# Patient Record
Sex: Female | Born: 1970 | Race: White | Hispanic: No | Smoking: Former smoker
Health system: Southern US, Community
[De-identification: ages and names within clinical notes are randomized; demographics above are authoritative.]

## PROBLEM LIST (undated history)

## (undated) DIAGNOSIS — R5383 Other fatigue: Secondary | ICD-10-CM

## (undated) DIAGNOSIS — M25472 Effusion, left ankle: Secondary | ICD-10-CM

## (undated) DIAGNOSIS — Z87442 Personal history of urinary calculi: Secondary | ICD-10-CM

## (undated) DIAGNOSIS — Z0282 Encounter for adoption services: Secondary | ICD-10-CM

## (undated) DIAGNOSIS — F4310 Post-traumatic stress disorder, unspecified: Secondary | ICD-10-CM

## (undated) DIAGNOSIS — R7303 Prediabetes: Secondary | ICD-10-CM

## (undated) DIAGNOSIS — R7689 Other specified abnormal immunological findings in serum: Secondary | ICD-10-CM

## (undated) DIAGNOSIS — I10 Essential (primary) hypertension: Secondary | ICD-10-CM

## (undated) DIAGNOSIS — M25471 Effusion, right ankle: Secondary | ICD-10-CM

## (undated) DIAGNOSIS — G43109 Migraine with aura, not intractable, without status migrainosus: Secondary | ICD-10-CM

## (undated) DIAGNOSIS — E538 Deficiency of other specified B group vitamins: Secondary | ICD-10-CM

## (undated) DIAGNOSIS — K529 Noninfective gastroenteritis and colitis, unspecified: Secondary | ICD-10-CM

## (undated) DIAGNOSIS — E079 Disorder of thyroid, unspecified: Secondary | ICD-10-CM

## (undated) DIAGNOSIS — J302 Other seasonal allergic rhinitis: Secondary | ICD-10-CM

## (undated) DIAGNOSIS — R768 Other specified abnormal immunological findings in serum: Secondary | ICD-10-CM

## (undated) DIAGNOSIS — I872 Venous insufficiency (chronic) (peripheral): Secondary | ICD-10-CM

## (undated) DIAGNOSIS — M545 Low back pain, unspecified: Secondary | ICD-10-CM

## (undated) DIAGNOSIS — Z8614 Personal history of Methicillin resistant Staphylococcus aureus infection: Secondary | ICD-10-CM

## (undated) DIAGNOSIS — K5790 Diverticulosis of intestine, part unspecified, without perforation or abscess without bleeding: Secondary | ICD-10-CM

## (undated) DIAGNOSIS — R0602 Shortness of breath: Secondary | ICD-10-CM

## (undated) DIAGNOSIS — M1990 Unspecified osteoarthritis, unspecified site: Secondary | ICD-10-CM

## (undated) DIAGNOSIS — E042 Nontoxic multinodular goiter: Secondary | ICD-10-CM

## (undated) DIAGNOSIS — I5030 Unspecified diastolic (congestive) heart failure: Secondary | ICD-10-CM

## (undated) DIAGNOSIS — F32A Depression, unspecified: Secondary | ICD-10-CM

## (undated) DIAGNOSIS — D1720 Benign lipomatous neoplasm of skin and subcutaneous tissue of unspecified limb: Secondary | ICD-10-CM

## (undated) DIAGNOSIS — E559 Vitamin D deficiency, unspecified: Secondary | ICD-10-CM

## (undated) DIAGNOSIS — G4733 Obstructive sleep apnea (adult) (pediatric): Secondary | ICD-10-CM

## (undated) DIAGNOSIS — G8929 Other chronic pain: Secondary | ICD-10-CM

## (undated) DIAGNOSIS — F419 Anxiety disorder, unspecified: Secondary | ICD-10-CM

## (undated) DIAGNOSIS — E049 Nontoxic goiter, unspecified: Secondary | ICD-10-CM

## (undated) DIAGNOSIS — U071 COVID-19: Secondary | ICD-10-CM

## (undated) DIAGNOSIS — Z91018 Allergy to other foods: Secondary | ICD-10-CM

## (undated) DIAGNOSIS — K219 Gastro-esophageal reflux disease without esophagitis: Secondary | ICD-10-CM

## (undated) DIAGNOSIS — R600 Localized edema: Secondary | ICD-10-CM

## (undated) DIAGNOSIS — F319 Bipolar disorder, unspecified: Secondary | ICD-10-CM

## (undated) DIAGNOSIS — N189 Chronic kidney disease, unspecified: Secondary | ICD-10-CM

## (undated) DIAGNOSIS — Z96659 Presence of unspecified artificial knee joint: Secondary | ICD-10-CM

## (undated) DIAGNOSIS — M25469 Effusion, unspecified knee: Secondary | ICD-10-CM

## (undated) DIAGNOSIS — E785 Hyperlipidemia, unspecified: Secondary | ICD-10-CM

## (undated) DIAGNOSIS — R011 Cardiac murmur, unspecified: Secondary | ICD-10-CM

## (undated) DIAGNOSIS — N393 Stress incontinence (female) (male): Secondary | ICD-10-CM

## (undated) DIAGNOSIS — K449 Diaphragmatic hernia without obstruction or gangrene: Secondary | ICD-10-CM

## (undated) DIAGNOSIS — M25475 Effusion, left foot: Secondary | ICD-10-CM

## (undated) DIAGNOSIS — N3941 Urge incontinence: Secondary | ICD-10-CM

## (undated) DIAGNOSIS — F1120 Opioid dependence, uncomplicated: Secondary | ICD-10-CM

## (undated) DIAGNOSIS — R51 Headache: Secondary | ICD-10-CM

## (undated) DIAGNOSIS — I5031 Acute diastolic (congestive) heart failure: Secondary | ICD-10-CM

## (undated) DIAGNOSIS — M25474 Effusion, right foot: Secondary | ICD-10-CM

## (undated) DIAGNOSIS — I83009 Varicose veins of unspecified lower extremity with ulcer of unspecified site: Secondary | ICD-10-CM

## (undated) DIAGNOSIS — N200 Calculus of kidney: Secondary | ICD-10-CM

## (undated) DIAGNOSIS — G5602 Carpal tunnel syndrome, left upper limb: Secondary | ICD-10-CM

## (undated) DIAGNOSIS — I739 Peripheral vascular disease, unspecified: Secondary | ICD-10-CM

## (undated) DIAGNOSIS — D649 Anemia, unspecified: Secondary | ICD-10-CM

## (undated) DIAGNOSIS — Z8489 Family history of other specified conditions: Secondary | ICD-10-CM

## (undated) DIAGNOSIS — S83249A Other tear of medial meniscus, current injury, unspecified knee, initial encounter: Secondary | ICD-10-CM

## (undated) DIAGNOSIS — M25569 Pain in unspecified knee: Secondary | ICD-10-CM

## (undated) DIAGNOSIS — E039 Hypothyroidism, unspecified: Secondary | ICD-10-CM

## (undated) DIAGNOSIS — G4730 Sleep apnea, unspecified: Secondary | ICD-10-CM

## (undated) DIAGNOSIS — F909 Attention-deficit hyperactivity disorder, unspecified type: Secondary | ICD-10-CM

## (undated) DIAGNOSIS — F329 Major depressive disorder, single episode, unspecified: Secondary | ICD-10-CM

## (undated) DIAGNOSIS — M722 Plantar fascial fibromatosis: Secondary | ICD-10-CM

## (undated) DIAGNOSIS — I82409 Acute embolism and thrombosis of unspecified deep veins of unspecified lower extremity: Secondary | ICD-10-CM

## (undated) DIAGNOSIS — G5600 Carpal tunnel syndrome, unspecified upper limb: Secondary | ICD-10-CM

## (undated) DIAGNOSIS — I890 Lymphedema, not elsewhere classified: Secondary | ICD-10-CM

## (undated) DIAGNOSIS — I5032 Chronic diastolic (congestive) heart failure: Secondary | ICD-10-CM

## (undated) DIAGNOSIS — R079 Chest pain, unspecified: Secondary | ICD-10-CM

## (undated) DIAGNOSIS — Z86718 Personal history of other venous thrombosis and embolism: Secondary | ICD-10-CM

## (undated) DIAGNOSIS — J45909 Unspecified asthma, uncomplicated: Secondary | ICD-10-CM

## (undated) DIAGNOSIS — M79642 Pain in left hand: Secondary | ICD-10-CM

## (undated) DIAGNOSIS — Z789 Other specified health status: Secondary | ICD-10-CM

## (undated) DIAGNOSIS — L97909 Non-pressure chronic ulcer of unspecified part of unspecified lower leg with unspecified severity: Secondary | ICD-10-CM

## (undated) DIAGNOSIS — I509 Heart failure, unspecified: Secondary | ICD-10-CM

## (undated) DIAGNOSIS — R12 Heartburn: Secondary | ICD-10-CM

## (undated) DIAGNOSIS — M79641 Pain in right hand: Secondary | ICD-10-CM

## (undated) HISTORY — DX: Stress incontinence (female) (male): N39.3

## (undated) HISTORY — PX: REPLACEMENT TOTAL KNEE: SUR1224

## (undated) HISTORY — DX: Heartburn: R12

## (undated) HISTORY — DX: Hyperlipidemia, unspecified: E78.5

## (undated) HISTORY — DX: Venous insufficiency (chronic) (peripheral): I87.2

## (undated) HISTORY — DX: Acute diastolic (congestive) heart failure: I50.31

## (undated) HISTORY — DX: Other fatigue: R53.83

## (undated) HISTORY — DX: Other specified health status: Z78.9

## (undated) HISTORY — DX: Unspecified asthma, uncomplicated: J45.909

## (undated) HISTORY — DX: Deficiency of other specified B group vitamins: E53.8

## (undated) HISTORY — DX: Prediabetes: R73.03

## (undated) HISTORY — DX: Urge incontinence: N39.41

## (undated) HISTORY — DX: Diverticulosis of intestine, part unspecified, without perforation or abscess without bleeding: K57.90

## (undated) HISTORY — DX: Effusion, unspecified knee: M25.469

## (undated) HISTORY — DX: Diaphragmatic hernia without obstruction or gangrene: K44.9

## (undated) HISTORY — DX: Effusion, left ankle: M25.472

## (undated) HISTORY — DX: Allergy to other foods: Z91.018

## (undated) HISTORY — DX: Encounter for adoption services: Z02.82

## (undated) HISTORY — DX: Vitamin D deficiency, unspecified: E55.9

## (undated) HISTORY — PX: CHOLECYSTECTOMY: SHX55

## (undated) HISTORY — DX: Presence of unspecified artificial knee joint: Z96.659

## (undated) HISTORY — PX: JOINT REPLACEMENT: SHX530

## (undated) HISTORY — DX: Personal history of other venous thrombosis and embolism: Z86.718

## (undated) HISTORY — DX: Cardiac murmur, unspecified: R01.1

## (undated) HISTORY — PX: ABDOMINAL HYSTERECTOMY: SHX81

## (undated) HISTORY — DX: Lymphedema, not elsewhere classified: I89.0

## (undated) HISTORY — DX: Chest pain, unspecified: R07.9

## (undated) HISTORY — PX: ANKLE SURGERY: SHX546

## (undated) HISTORY — DX: Benign lipomatous neoplasm of skin and subcutaneous tissue of unspecified limb: D17.20

## (undated) HISTORY — DX: Carpal tunnel syndrome, unspecified upper limb: G56.00

## (undated) HISTORY — DX: Effusion, left foot: M25.475

## (undated) HISTORY — DX: Unspecified osteoarthritis, unspecified site: M19.90

## (undated) HISTORY — DX: Shortness of breath: R06.02

## (undated) HISTORY — PX: WRIST SURGERY: SHX841

## (undated) HISTORY — DX: Attention-deficit hyperactivity disorder, unspecified type: F90.9

## (undated) HISTORY — PX: KNEE ARTHROSCOPY: SUR90

## (undated) HISTORY — DX: Noninfective gastroenteritis and colitis, unspecified: K52.9

## (undated) HISTORY — PX: DILATION AND CURETTAGE OF UTERUS: SHX78

## (undated) HISTORY — DX: Effusion, right ankle: M25.471

## (undated) HISTORY — DX: Acute embolism and thrombosis of unspecified deep veins of unspecified lower extremity: I82.409

## (undated) HISTORY — DX: Personal history of urinary calculi: Z87.442

## (undated) HISTORY — PX: TUBAL LIGATION: SHX77

## (undated) HISTORY — DX: Effusion, right foot: M25.474

## (undated) SURGERY — REPAIR, HERNIA, INGUINAL, BILATERAL, LAPAROSCOPIC
Anesthesia: General | Laterality: Bilateral

---

## 1997-12-15 ENCOUNTER — Ambulatory Visit (HOSPITAL_COMMUNITY): Admission: RE | Admit: 1997-12-15 | Discharge: 1997-12-15 | Payer: Self-pay | Admitting: Family Medicine

## 1998-01-14 ENCOUNTER — Ambulatory Visit (HOSPITAL_COMMUNITY): Admission: RE | Admit: 1998-01-14 | Discharge: 1998-01-14 | Payer: Self-pay | Admitting: Obstetrics and Gynecology

## 1998-01-17 ENCOUNTER — Ambulatory Visit (HOSPITAL_COMMUNITY): Admission: AD | Admit: 1998-01-17 | Discharge: 1998-01-17 | Payer: Self-pay | Admitting: Obstetrics and Gynecology

## 1998-02-07 ENCOUNTER — Ambulatory Visit (HOSPITAL_COMMUNITY): Admission: RE | Admit: 1998-02-07 | Discharge: 1998-02-07 | Payer: Self-pay | Admitting: *Deleted

## 1998-05-22 ENCOUNTER — Emergency Department (HOSPITAL_COMMUNITY): Admission: EM | Admit: 1998-05-22 | Discharge: 1998-05-22 | Payer: Self-pay | Admitting: Internal Medicine

## 1998-06-13 ENCOUNTER — Emergency Department (HOSPITAL_COMMUNITY): Admission: EM | Admit: 1998-06-13 | Discharge: 1998-06-13 | Payer: Self-pay | Admitting: Emergency Medicine

## 1998-06-19 ENCOUNTER — Emergency Department (HOSPITAL_COMMUNITY): Admission: EM | Admit: 1998-06-19 | Discharge: 1998-06-19 | Payer: Self-pay | Admitting: Internal Medicine

## 1998-06-19 ENCOUNTER — Encounter: Payer: Self-pay | Admitting: Internal Medicine

## 1998-08-30 ENCOUNTER — Encounter: Payer: Self-pay | Admitting: Emergency Medicine

## 1998-08-30 ENCOUNTER — Emergency Department (HOSPITAL_COMMUNITY): Admission: EM | Admit: 1998-08-30 | Discharge: 1998-08-30 | Payer: Self-pay | Admitting: Emergency Medicine

## 1999-01-02 ENCOUNTER — Emergency Department (HOSPITAL_COMMUNITY): Admission: EM | Admit: 1999-01-02 | Discharge: 1999-01-03 | Payer: Self-pay | Admitting: Emergency Medicine

## 1999-03-06 ENCOUNTER — Emergency Department (HOSPITAL_COMMUNITY): Admission: EM | Admit: 1999-03-06 | Discharge: 1999-03-06 | Payer: Self-pay | Admitting: Emergency Medicine

## 1999-03-15 ENCOUNTER — Encounter: Payer: Self-pay | Admitting: Orthopedic Surgery

## 1999-03-15 ENCOUNTER — Ambulatory Visit (HOSPITAL_COMMUNITY): Admission: RE | Admit: 1999-03-15 | Discharge: 1999-03-15 | Payer: Self-pay | Admitting: Orthopedic Surgery

## 1999-04-13 ENCOUNTER — Emergency Department (HOSPITAL_COMMUNITY): Admission: EM | Admit: 1999-04-13 | Discharge: 1999-04-13 | Payer: Self-pay | Admitting: Emergency Medicine

## 1999-05-01 ENCOUNTER — Emergency Department (HOSPITAL_COMMUNITY): Admission: EM | Admit: 1999-05-01 | Discharge: 1999-05-01 | Payer: Self-pay | Admitting: Emergency Medicine

## 1999-05-29 ENCOUNTER — Ambulatory Visit (HOSPITAL_COMMUNITY): Admission: RE | Admit: 1999-05-29 | Discharge: 1999-05-29 | Payer: Self-pay | Admitting: Internal Medicine

## 1999-05-29 ENCOUNTER — Encounter: Payer: Self-pay | Admitting: Internal Medicine

## 1999-06-13 ENCOUNTER — Emergency Department (HOSPITAL_COMMUNITY): Admission: EM | Admit: 1999-06-13 | Discharge: 1999-06-13 | Payer: Self-pay | Admitting: Emergency Medicine

## 1999-06-14 ENCOUNTER — Encounter: Payer: Self-pay | Admitting: Emergency Medicine

## 1999-07-05 ENCOUNTER — Encounter: Payer: Self-pay | Admitting: Orthopedic Surgery

## 1999-07-05 ENCOUNTER — Ambulatory Visit (HOSPITAL_COMMUNITY): Admission: RE | Admit: 1999-07-05 | Discharge: 1999-07-05 | Payer: Self-pay | Admitting: Orthopedic Surgery

## 1999-07-19 ENCOUNTER — Encounter: Payer: Self-pay | Admitting: Orthopedic Surgery

## 1999-07-19 ENCOUNTER — Ambulatory Visit (HOSPITAL_COMMUNITY): Admission: RE | Admit: 1999-07-19 | Discharge: 1999-07-19 | Payer: Self-pay | Admitting: Orthopedic Surgery

## 1999-08-09 ENCOUNTER — Ambulatory Visit (HOSPITAL_BASED_OUTPATIENT_CLINIC_OR_DEPARTMENT_OTHER): Admission: RE | Admit: 1999-08-09 | Discharge: 1999-08-09 | Payer: Self-pay | Admitting: Orthopedic Surgery

## 1999-10-09 ENCOUNTER — Emergency Department (HOSPITAL_COMMUNITY): Admission: EM | Admit: 1999-10-09 | Discharge: 1999-10-09 | Payer: Self-pay

## 1999-10-22 ENCOUNTER — Emergency Department (HOSPITAL_COMMUNITY): Admission: EM | Admit: 1999-10-22 | Discharge: 1999-10-22 | Payer: Self-pay | Admitting: Emergency Medicine

## 1999-10-25 ENCOUNTER — Encounter: Payer: Self-pay | Admitting: Internal Medicine

## 1999-10-25 ENCOUNTER — Encounter: Admission: RE | Admit: 1999-10-25 | Discharge: 1999-10-25 | Payer: Self-pay | Admitting: Internal Medicine

## 1999-12-28 ENCOUNTER — Emergency Department (HOSPITAL_COMMUNITY): Admission: EM | Admit: 1999-12-28 | Discharge: 1999-12-29 | Payer: Self-pay | Admitting: Emergency Medicine

## 2000-01-03 ENCOUNTER — Ambulatory Visit (HOSPITAL_COMMUNITY): Admission: RE | Admit: 2000-01-03 | Discharge: 2000-01-03 | Payer: Self-pay | Admitting: Obstetrics & Gynecology

## 2000-04-17 ENCOUNTER — Other Ambulatory Visit: Admission: RE | Admit: 2000-04-17 | Discharge: 2000-04-17 | Payer: Self-pay | Admitting: *Deleted

## 2000-07-03 ENCOUNTER — Inpatient Hospital Stay (HOSPITAL_COMMUNITY): Admission: AD | Admit: 2000-07-03 | Discharge: 2000-07-05 | Payer: Self-pay | Admitting: Obstetrics and Gynecology

## 2000-07-05 ENCOUNTER — Encounter: Payer: Self-pay | Admitting: Obstetrics and Gynecology

## 2000-07-08 ENCOUNTER — Encounter: Payer: Self-pay | Admitting: Obstetrics & Gynecology

## 2000-07-08 ENCOUNTER — Ambulatory Visit (HOSPITAL_COMMUNITY): Admission: RE | Admit: 2000-07-08 | Discharge: 2000-07-08 | Payer: Self-pay | Admitting: Obstetrics & Gynecology

## 2000-09-04 ENCOUNTER — Inpatient Hospital Stay: Admission: AD | Admit: 2000-09-04 | Discharge: 2000-09-04 | Payer: Self-pay | Admitting: Obstetrics and Gynecology

## 2000-09-04 ENCOUNTER — Encounter: Payer: Self-pay | Admitting: Obstetrics and Gynecology

## 2000-09-05 ENCOUNTER — Inpatient Hospital Stay (HOSPITAL_COMMUNITY): Admission: AD | Admit: 2000-09-05 | Discharge: 2000-09-05 | Payer: Self-pay | Admitting: Obstetrics and Gynecology

## 2000-10-04 ENCOUNTER — Ambulatory Visit (HOSPITAL_COMMUNITY): Admission: RE | Admit: 2000-10-04 | Discharge: 2000-10-04 | Payer: Self-pay | Admitting: Obstetrics and Gynecology

## 2000-10-25 ENCOUNTER — Ambulatory Visit (HOSPITAL_COMMUNITY): Admission: RE | Admit: 2000-10-25 | Discharge: 2000-10-25 | Payer: Self-pay | Admitting: *Deleted

## 2000-10-31 ENCOUNTER — Inpatient Hospital Stay (HOSPITAL_COMMUNITY): Admission: AD | Admit: 2000-10-31 | Discharge: 2000-10-31 | Payer: Self-pay | Admitting: Obstetrics & Gynecology

## 2000-11-09 ENCOUNTER — Inpatient Hospital Stay (HOSPITAL_COMMUNITY): Admission: AD | Admit: 2000-11-09 | Discharge: 2000-11-12 | Payer: Self-pay | Admitting: Obstetrics & Gynecology

## 2000-11-09 ENCOUNTER — Encounter (INDEPENDENT_AMBULATORY_CARE_PROVIDER_SITE_OTHER): Payer: Self-pay | Admitting: Specialist

## 2000-11-14 ENCOUNTER — Encounter: Admission: RE | Admit: 2000-11-14 | Discharge: 2000-12-14 | Payer: Self-pay | Admitting: Obstetrics & Gynecology

## 2001-02-20 ENCOUNTER — Ambulatory Visit (HOSPITAL_BASED_OUTPATIENT_CLINIC_OR_DEPARTMENT_OTHER): Admission: RE | Admit: 2001-02-20 | Discharge: 2001-02-20 | Payer: Self-pay | Admitting: Orthopedic Surgery

## 2001-05-27 ENCOUNTER — Ambulatory Visit (HOSPITAL_BASED_OUTPATIENT_CLINIC_OR_DEPARTMENT_OTHER): Admission: RE | Admit: 2001-05-27 | Discharge: 2001-05-28 | Payer: Self-pay | Admitting: Orthopedic Surgery

## 2002-01-09 ENCOUNTER — Encounter: Payer: Self-pay | Admitting: Emergency Medicine

## 2002-01-09 ENCOUNTER — Ambulatory Visit (HOSPITAL_COMMUNITY): Admission: RE | Admit: 2002-01-09 | Discharge: 2002-01-09 | Payer: Self-pay | Admitting: Emergency Medicine

## 2002-02-19 ENCOUNTER — Emergency Department (HOSPITAL_COMMUNITY): Admission: EM | Admit: 2002-02-19 | Discharge: 2002-02-20 | Payer: Self-pay | Admitting: Emergency Medicine

## 2002-06-03 ENCOUNTER — Emergency Department (HOSPITAL_COMMUNITY): Admission: EM | Admit: 2002-06-03 | Discharge: 2002-06-03 | Payer: Self-pay | Admitting: Emergency Medicine

## 2002-06-09 ENCOUNTER — Other Ambulatory Visit: Admission: RE | Admit: 2002-06-09 | Discharge: 2002-06-09 | Payer: Self-pay | Admitting: Obstetrics and Gynecology

## 2002-07-02 ENCOUNTER — Other Ambulatory Visit: Admission: RE | Admit: 2002-07-02 | Discharge: 2002-07-02 | Payer: Self-pay | Admitting: *Deleted

## 2002-07-07 ENCOUNTER — Encounter (INDEPENDENT_AMBULATORY_CARE_PROVIDER_SITE_OTHER): Payer: Self-pay | Admitting: Specialist

## 2002-07-07 ENCOUNTER — Observation Stay (HOSPITAL_COMMUNITY): Admission: RE | Admit: 2002-07-07 | Discharge: 2002-07-08 | Payer: Self-pay | Admitting: Obstetrics and Gynecology

## 2003-02-25 ENCOUNTER — Inpatient Hospital Stay (HOSPITAL_COMMUNITY): Admission: EM | Admit: 2003-02-25 | Discharge: 2003-03-05 | Payer: Self-pay | Admitting: Psychiatry

## 2003-03-08 ENCOUNTER — Other Ambulatory Visit (HOSPITAL_COMMUNITY): Admission: RE | Admit: 2003-03-08 | Discharge: 2003-03-24 | Payer: Self-pay | Admitting: Psychiatry

## 2003-03-30 ENCOUNTER — Inpatient Hospital Stay (HOSPITAL_COMMUNITY): Admission: EM | Admit: 2003-03-30 | Discharge: 2003-04-15 | Payer: Self-pay | Admitting: Psychiatry

## 2003-04-12 ENCOUNTER — Encounter: Payer: Self-pay | Admitting: Emergency Medicine

## 2003-04-14 ENCOUNTER — Encounter: Payer: Self-pay | Admitting: Emergency Medicine

## 2003-04-15 ENCOUNTER — Encounter: Payer: Self-pay | Admitting: Emergency Medicine

## 2003-04-17 ENCOUNTER — Emergency Department (HOSPITAL_COMMUNITY): Admission: EM | Admit: 2003-04-17 | Discharge: 2003-04-18 | Payer: Self-pay | Admitting: Emergency Medicine

## 2003-04-20 ENCOUNTER — Encounter: Admission: RE | Admit: 2003-04-20 | Discharge: 2003-04-20 | Payer: Self-pay | Admitting: General Surgery

## 2003-04-20 ENCOUNTER — Encounter: Payer: Self-pay | Admitting: General Surgery

## 2003-04-27 ENCOUNTER — Encounter: Payer: Self-pay | Admitting: General Surgery

## 2003-04-27 ENCOUNTER — Encounter (INDEPENDENT_AMBULATORY_CARE_PROVIDER_SITE_OTHER): Payer: Self-pay

## 2003-04-27 ENCOUNTER — Observation Stay (HOSPITAL_COMMUNITY): Admission: RE | Admit: 2003-04-27 | Discharge: 2003-04-28 | Payer: Self-pay | Admitting: General Surgery

## 2003-08-13 ENCOUNTER — Other Ambulatory Visit (HOSPITAL_COMMUNITY): Admission: RE | Admit: 2003-08-13 | Discharge: 2003-08-20 | Payer: Self-pay | Admitting: Psychiatry

## 2003-08-19 ENCOUNTER — Inpatient Hospital Stay (HOSPITAL_COMMUNITY): Admission: EM | Admit: 2003-08-19 | Discharge: 2003-08-27 | Payer: Self-pay | Admitting: Psychiatry

## 2003-08-26 ENCOUNTER — Encounter: Payer: Self-pay | Admitting: Psychiatry

## 2003-08-30 ENCOUNTER — Other Ambulatory Visit (HOSPITAL_COMMUNITY): Admission: RE | Admit: 2003-08-30 | Discharge: 2003-09-16 | Payer: Self-pay | Admitting: Psychiatry

## 2003-09-21 ENCOUNTER — Encounter: Admission: RE | Admit: 2003-09-21 | Discharge: 2003-09-21 | Payer: Self-pay | Admitting: Psychiatry

## 2003-11-19 ENCOUNTER — Inpatient Hospital Stay (HOSPITAL_COMMUNITY): Admission: EM | Admit: 2003-11-19 | Discharge: 2003-11-22 | Payer: Self-pay | Admitting: Emergency Medicine

## 2003-12-22 ENCOUNTER — Emergency Department (HOSPITAL_COMMUNITY): Admission: EM | Admit: 2003-12-22 | Discharge: 2003-12-22 | Payer: Self-pay | Admitting: Family Medicine

## 2003-12-25 ENCOUNTER — Emergency Department (HOSPITAL_COMMUNITY): Admission: EM | Admit: 2003-12-25 | Discharge: 2003-12-25 | Payer: Self-pay | Admitting: *Deleted

## 2004-01-10 ENCOUNTER — Emergency Department (HOSPITAL_COMMUNITY): Admission: EM | Admit: 2004-01-10 | Discharge: 2004-01-10 | Payer: Self-pay | Admitting: Family Medicine

## 2004-04-26 ENCOUNTER — Emergency Department (HOSPITAL_COMMUNITY): Admission: EM | Admit: 2004-04-26 | Discharge: 2004-04-26 | Payer: Self-pay | Admitting: Emergency Medicine

## 2004-07-26 ENCOUNTER — Emergency Department (HOSPITAL_COMMUNITY): Admission: EM | Admit: 2004-07-26 | Discharge: 2004-07-26 | Payer: Self-pay | Admitting: Emergency Medicine

## 2004-10-07 ENCOUNTER — Emergency Department: Payer: Self-pay | Admitting: Unknown Physician Specialty

## 2004-10-08 ENCOUNTER — Emergency Department (HOSPITAL_COMMUNITY): Admission: EM | Admit: 2004-10-08 | Discharge: 2004-10-08 | Payer: Self-pay | Admitting: Emergency Medicine

## 2004-10-10 ENCOUNTER — Emergency Department (HOSPITAL_COMMUNITY): Admission: EM | Admit: 2004-10-10 | Discharge: 2004-10-10 | Payer: Self-pay | Admitting: Family Medicine

## 2004-11-08 ENCOUNTER — Emergency Department: Payer: Self-pay | Admitting: Emergency Medicine

## 2005-02-11 ENCOUNTER — Emergency Department: Payer: Self-pay | Admitting: Emergency Medicine

## 2005-03-16 ENCOUNTER — Emergency Department: Payer: Self-pay | Admitting: Emergency Medicine

## 2005-03-17 ENCOUNTER — Ambulatory Visit: Payer: Self-pay | Admitting: Psychiatry

## 2005-03-17 ENCOUNTER — Inpatient Hospital Stay (HOSPITAL_COMMUNITY): Admission: EM | Admit: 2005-03-17 | Discharge: 2005-03-26 | Payer: Self-pay | Admitting: Psychiatry

## 2005-03-31 ENCOUNTER — Emergency Department: Payer: Self-pay | Admitting: Emergency Medicine

## 2005-04-26 ENCOUNTER — Emergency Department: Payer: Self-pay | Admitting: Emergency Medicine

## 2005-07-10 ENCOUNTER — Emergency Department: Payer: Self-pay | Admitting: General Practice

## 2005-08-23 ENCOUNTER — Emergency Department: Payer: Self-pay | Admitting: Emergency Medicine

## 2005-09-22 ENCOUNTER — Emergency Department: Payer: Self-pay | Admitting: Internal Medicine

## 2005-10-15 ENCOUNTER — Emergency Department: Payer: Self-pay | Admitting: General Practice

## 2005-12-19 ENCOUNTER — Emergency Department: Payer: Self-pay | Admitting: Emergency Medicine

## 2005-12-24 ENCOUNTER — Emergency Department: Payer: Self-pay | Admitting: Emergency Medicine

## 2005-12-30 ENCOUNTER — Emergency Department: Payer: Self-pay | Admitting: Emergency Medicine

## 2006-01-20 ENCOUNTER — Emergency Department: Payer: Self-pay | Admitting: Emergency Medicine

## 2006-03-20 ENCOUNTER — Emergency Department: Payer: Self-pay | Admitting: Emergency Medicine

## 2006-03-22 ENCOUNTER — Emergency Department: Payer: Self-pay

## 2006-03-23 ENCOUNTER — Emergency Department: Payer: Self-pay | Admitting: Emergency Medicine

## 2006-03-24 ENCOUNTER — Emergency Department (HOSPITAL_COMMUNITY): Admission: EM | Admit: 2006-03-24 | Discharge: 2006-03-24 | Payer: Self-pay | Admitting: Emergency Medicine

## 2006-05-26 ENCOUNTER — Emergency Department (HOSPITAL_COMMUNITY): Admission: EM | Admit: 2006-05-26 | Discharge: 2006-05-27 | Payer: Self-pay | Admitting: Emergency Medicine

## 2006-06-02 ENCOUNTER — Emergency Department: Payer: Self-pay | Admitting: Emergency Medicine

## 2006-09-10 ENCOUNTER — Other Ambulatory Visit: Payer: Self-pay

## 2006-09-10 ENCOUNTER — Emergency Department: Payer: Self-pay | Admitting: Emergency Medicine

## 2006-09-18 ENCOUNTER — Ambulatory Visit: Payer: Self-pay | Admitting: Family Medicine

## 2006-09-29 ENCOUNTER — Emergency Department: Payer: Self-pay | Admitting: General Practice

## 2007-02-03 ENCOUNTER — Emergency Department: Payer: Self-pay

## 2007-02-16 ENCOUNTER — Emergency Department: Payer: Self-pay | Admitting: Emergency Medicine

## 2007-02-28 ENCOUNTER — Emergency Department: Payer: Self-pay | Admitting: Emergency Medicine

## 2007-03-15 ENCOUNTER — Emergency Department: Payer: Self-pay | Admitting: Emergency Medicine

## 2007-03-18 ENCOUNTER — Emergency Department: Payer: Self-pay | Admitting: Emergency Medicine

## 2007-04-03 ENCOUNTER — Other Ambulatory Visit: Payer: Self-pay

## 2007-04-03 ENCOUNTER — Emergency Department: Payer: Self-pay | Admitting: Emergency Medicine

## 2007-04-04 ENCOUNTER — Other Ambulatory Visit: Payer: Self-pay

## 2007-04-04 ENCOUNTER — Emergency Department: Payer: Self-pay | Admitting: Unknown Physician Specialty

## 2007-04-14 ENCOUNTER — Emergency Department: Payer: Self-pay | Admitting: Emergency Medicine

## 2007-05-08 ENCOUNTER — Emergency Department: Payer: Self-pay | Admitting: Emergency Medicine

## 2007-05-09 ENCOUNTER — Emergency Department: Payer: Self-pay | Admitting: Emergency Medicine

## 2007-07-11 ENCOUNTER — Emergency Department: Payer: Self-pay | Admitting: Emergency Medicine

## 2007-08-26 ENCOUNTER — Emergency Department: Payer: Self-pay | Admitting: Emergency Medicine

## 2007-10-03 ENCOUNTER — Other Ambulatory Visit: Payer: Self-pay

## 2007-10-03 ENCOUNTER — Emergency Department: Payer: Self-pay | Admitting: Unknown Physician Specialty

## 2007-10-23 ENCOUNTER — Emergency Department: Payer: Self-pay | Admitting: Emergency Medicine

## 2007-11-01 ENCOUNTER — Emergency Department: Payer: Self-pay | Admitting: Internal Medicine

## 2007-11-01 ENCOUNTER — Emergency Department: Payer: Self-pay | Admitting: Emergency Medicine

## 2007-11-06 ENCOUNTER — Emergency Department: Payer: Self-pay | Admitting: Emergency Medicine

## 2007-12-01 ENCOUNTER — Emergency Department: Payer: Self-pay | Admitting: Emergency Medicine

## 2007-12-05 ENCOUNTER — Emergency Department (HOSPITAL_COMMUNITY): Admission: EM | Admit: 2007-12-05 | Discharge: 2007-12-05 | Payer: Self-pay | Admitting: Emergency Medicine

## 2007-12-30 ENCOUNTER — Other Ambulatory Visit: Payer: Self-pay

## 2007-12-30 ENCOUNTER — Emergency Department: Payer: Self-pay | Admitting: Emergency Medicine

## 2008-01-01 ENCOUNTER — Other Ambulatory Visit: Payer: Self-pay

## 2008-01-01 ENCOUNTER — Emergency Department: Payer: Self-pay | Admitting: Emergency Medicine

## 2008-01-10 ENCOUNTER — Emergency Department: Payer: Self-pay | Admitting: Emergency Medicine

## 2008-02-04 ENCOUNTER — Emergency Department: Payer: Self-pay | Admitting: Emergency Medicine

## 2008-02-13 ENCOUNTER — Emergency Department: Payer: Self-pay | Admitting: Internal Medicine

## 2008-02-18 ENCOUNTER — Ambulatory Visit: Payer: Self-pay | Admitting: Endocrinology

## 2008-03-09 ENCOUNTER — Emergency Department: Payer: Self-pay | Admitting: Emergency Medicine

## 2008-03-11 ENCOUNTER — Emergency Department: Payer: Self-pay | Admitting: Emergency Medicine

## 2008-04-02 ENCOUNTER — Emergency Department: Payer: Self-pay | Admitting: Emergency Medicine

## 2008-05-29 ENCOUNTER — Ambulatory Visit: Payer: Self-pay

## 2008-06-17 ENCOUNTER — Emergency Department: Payer: Self-pay | Admitting: Emergency Medicine

## 2008-07-02 ENCOUNTER — Ambulatory Visit: Payer: Self-pay | Admitting: Orthopedic Surgery

## 2008-07-06 ENCOUNTER — Ambulatory Visit: Payer: Self-pay | Admitting: Orthopedic Surgery

## 2008-09-19 ENCOUNTER — Emergency Department: Payer: Self-pay | Admitting: Emergency Medicine

## 2008-10-05 ENCOUNTER — Emergency Department: Payer: Self-pay | Admitting: Emergency Medicine

## 2008-10-06 ENCOUNTER — Ambulatory Visit: Payer: Self-pay | Admitting: Internal Medicine

## 2008-10-30 ENCOUNTER — Emergency Department: Payer: Self-pay | Admitting: Emergency Medicine

## 2008-11-04 ENCOUNTER — Emergency Department: Payer: Self-pay | Admitting: Emergency Medicine

## 2008-12-03 ENCOUNTER — Emergency Department (HOSPITAL_COMMUNITY): Admission: EM | Admit: 2008-12-03 | Discharge: 2008-12-03 | Payer: Self-pay | Admitting: Emergency Medicine

## 2008-12-09 ENCOUNTER — Emergency Department: Payer: Self-pay | Admitting: Emergency Medicine

## 2009-01-09 ENCOUNTER — Emergency Department: Payer: Self-pay | Admitting: Internal Medicine

## 2009-02-20 ENCOUNTER — Emergency Department: Payer: Self-pay | Admitting: Emergency Medicine

## 2009-03-06 ENCOUNTER — Emergency Department: Payer: Self-pay | Admitting: Emergency Medicine

## 2009-03-30 ENCOUNTER — Emergency Department: Payer: Self-pay | Admitting: Emergency Medicine

## 2009-04-03 ENCOUNTER — Emergency Department: Payer: Self-pay | Admitting: Emergency Medicine

## 2009-04-17 ENCOUNTER — Emergency Department: Payer: Self-pay | Admitting: Emergency Medicine

## 2009-04-25 ENCOUNTER — Other Ambulatory Visit: Payer: Self-pay

## 2009-04-25 ENCOUNTER — Other Ambulatory Visit: Payer: Self-pay | Admitting: Emergency Medicine

## 2009-04-25 ENCOUNTER — Ambulatory Visit: Payer: Self-pay | Admitting: Psychiatry

## 2009-04-25 ENCOUNTER — Inpatient Hospital Stay (HOSPITAL_COMMUNITY): Admission: RE | Admit: 2009-04-25 | Discharge: 2009-05-02 | Payer: Self-pay | Admitting: Psychiatry

## 2009-05-24 ENCOUNTER — Ambulatory Visit: Payer: Self-pay | Admitting: Orthopedic Surgery

## 2009-06-03 ENCOUNTER — Ambulatory Visit: Payer: Self-pay | Admitting: Orthopedic Surgery

## 2009-06-07 ENCOUNTER — Ambulatory Visit: Payer: Self-pay | Admitting: Orthopedic Surgery

## 2009-06-30 ENCOUNTER — Emergency Department: Payer: Self-pay | Admitting: Internal Medicine

## 2009-10-08 ENCOUNTER — Emergency Department: Payer: Self-pay | Admitting: Internal Medicine

## 2009-10-29 ENCOUNTER — Emergency Department: Payer: Self-pay | Admitting: Emergency Medicine

## 2009-11-09 ENCOUNTER — Emergency Department: Payer: Self-pay | Admitting: Emergency Medicine

## 2009-12-28 ENCOUNTER — Emergency Department: Payer: Self-pay | Admitting: Emergency Medicine

## 2010-01-12 ENCOUNTER — Emergency Department: Payer: Self-pay | Admitting: Emergency Medicine

## 2010-03-26 ENCOUNTER — Emergency Department: Payer: Self-pay | Admitting: Emergency Medicine

## 2010-04-28 ENCOUNTER — Emergency Department: Payer: Self-pay | Admitting: Emergency Medicine

## 2010-04-30 ENCOUNTER — Emergency Department: Payer: Self-pay | Admitting: Emergency Medicine

## 2010-05-08 ENCOUNTER — Emergency Department: Payer: Self-pay | Admitting: Unknown Physician Specialty

## 2010-05-13 ENCOUNTER — Emergency Department: Payer: Self-pay | Admitting: Emergency Medicine

## 2010-06-28 ENCOUNTER — Emergency Department: Payer: Self-pay | Admitting: Emergency Medicine

## 2010-09-01 ENCOUNTER — Emergency Department: Payer: Self-pay | Admitting: Emergency Medicine

## 2010-10-09 ENCOUNTER — Emergency Department: Payer: Self-pay | Admitting: Emergency Medicine

## 2010-10-19 LAB — RAPID URINE DRUG SCREEN, HOSP PERFORMED
Cocaine: NOT DETECTED
Opiates: NOT DETECTED
Tetrahydrocannabinol: NOT DETECTED

## 2010-10-19 LAB — URINALYSIS, ROUTINE W REFLEX MICROSCOPIC
Bilirubin Urine: NEGATIVE
Glucose, UA: NEGATIVE mg/dL
Glucose, UA: NEGATIVE mg/dL
Hgb urine dipstick: NEGATIVE
Ketones, ur: NEGATIVE mg/dL
Leukocytes, UA: NEGATIVE
Nitrite: NEGATIVE
Protein, ur: NEGATIVE mg/dL
Protein, ur: NEGATIVE mg/dL
Urobilinogen, UA: 0.2 mg/dL (ref 0.0–1.0)

## 2010-10-19 LAB — CBC
HCT: 34.5 % — ABNORMAL LOW (ref 36.0–46.0)
MCHC: 33.1 g/dL (ref 30.0–36.0)
MCV: 86 fL (ref 78.0–100.0)
Platelets: 361 10*3/uL (ref 150–400)
RDW: 14.6 % (ref 11.5–15.5)

## 2010-10-19 LAB — DIFFERENTIAL
Basophils Absolute: 0.2 10*3/uL — ABNORMAL HIGH (ref 0.0–0.1)
Basophils Relative: 2 % — ABNORMAL HIGH (ref 0–1)
Eosinophils Absolute: 0.3 10*3/uL (ref 0.0–0.7)
Eosinophils Relative: 3 % (ref 0–5)

## 2010-10-19 LAB — BASIC METABOLIC PANEL
BUN: 7 mg/dL (ref 6–23)
CO2: 30 mEq/L (ref 19–32)
GFR calc non Af Amer: >60 mL/min (ref >60)
Glucose, Bld: 98 mg/dL (ref 70–99)
Potassium: 3.6 mEq/L (ref 3.5–5.1)

## 2010-10-19 LAB — ACETAMINOPHEN LEVEL: Acetaminophen (Tylenol), Serum: <10 ug/mL — ABNORMAL LOW (ref 10–30)

## 2010-10-19 LAB — URINE MICROSCOPIC-ADD ON

## 2010-10-24 LAB — URINALYSIS, ROUTINE W REFLEX MICROSCOPIC
Bilirubin Urine: NEGATIVE
Glucose, UA: NEGATIVE mg/dL
Ketones, ur: NEGATIVE mg/dL
Specific Gravity, Urine: 1.009 (ref 1.005–1.030)
pH: 7 (ref 5.0–8.0)

## 2010-11-07 ENCOUNTER — Ambulatory Visit: Payer: Self-pay | Admitting: Internal Medicine

## 2010-11-09 ENCOUNTER — Ambulatory Visit: Payer: Self-pay | Admitting: Internal Medicine

## 2010-11-21 ENCOUNTER — Other Ambulatory Visit: Payer: Self-pay

## 2010-12-01 NOTE — Consult Note (Signed)
[facility]  Patient:    Alexis Christensen, Alexis Christensen                        MRN: [id number] Adm. Date:  [id number] Attending:  Esmeralda Arthur Dictator:   Silverio Lay, M.D.                          Consultation Report  REASON FOR CONSULTATION:      Severe migraine.  HISTORY OF PRESENT ILLNESS:   This is a 40 year old married white female gravida 3, para 1, abortus 1 at 19+ weeks pregnancy complaining of a severe headache for the last four days which has substantially increased today.  She reports a left-sided retro-orbital migraine with sinus pressure radiating to the back of her head and neck for which she has found minor relief with the use of Tylenol with Codeine or even Motrin 800 mg tonight after speaking with me on the telephone.  She also reports great decrease in sleep and increased stress at the work environment.  She denies any other neurological-associated symptoms besides photophobia and mild nausea.  Otherwise her pregnancy has been uneventful.  She reports good fetal activity, denies any contraction or abdominal pain.  PAST MEDICAL HISTORY:         Significant for allergy to PENICILLIN.  PHYSICAL EXAMINATION:  VITAL SIGNS:                  Reveals the vital signs normal with the blood pressure of 118/66, pulse at 68, respiration at 20, temperature at 97.4.  GENERAL:                      The patient in no acute distress.  HEAD, EYES, EARS, NOSE, AND THROAT:                       Negative.  Positive paranasal sinus pressure on her left side.  NECK:                         Lymph nodes and neck area negative.  Neck is flexible.  HEART:                        Normal.  LUNGS:                        Clear.  ABDOMEN:                      Gravid, nontender.  ASSESSMENT:                   Intrauterine pregnancy at 19+ weeks with a nonaccompanied migraine probably secondary to sinus congestion and allergies.  PLAN:                         The  patient is given one dose of Nubain 10 mg subcu and put on oxygen 5 liter a minute.  CBC and urinalysis are within normal limits.  When some relief achieved, the patient will be discharged home with Ambien and will follow in the office within one week. DD:  07/03/00 TD:  07/03/00 Job: [id number]

## 2010-12-01 NOTE — Op Note (Signed)
[facility]  Patient:    Alexis Christensen, Alexis Christensen                        MRN: [id number] Proc. Date: 11/10/00 Adm. Date:  [id number] Attending:  Genia Del                           Operative Report  DATE OF BIRTH:                June 19, 1971  PREOPERATIVE DIAGNOSIS:       Postpartum bilateral tubal ligation desired.  POSTOPERATIVE DIAGNOSIS:      Postpartum bilateral tubal ligation desired.  OPERATION:                    Bilateral tubal ligation by modified Pomeroy                               procedure through minilaparotomy.  SURGEON:                      Genia Del, M.D.  ANESTHESIOLOGIST:             Ellison Hughs., M.D.  ANESTHESIA:                   Epidural.  ESTIMATED BLOOD LOSS:         Minimal.  DESCRIPTION OF PROCEDURE:     Under epidural anesthesia, the patient is in a dorsal decubitus position. She is prepped with Betadine on the abdominal area. The drapes are placed as usual. Uterine height is at the umbilicus. A minilaparotomy approach is used. An incision is made infraumbilically measuring about 2.5 cm with a scalpel. We then open the aponeurosis and the peritoneum with the Mayo scissors while raising the aponeurosis with Allis clamps. Retractors are used. We enter the abdominal cavity, visualize the uterus, use the retractors to reach for the right tube. It is grasped with a Babcock and then followed to the fimbria for good identification. Once achieved, a window is created at the level of the mesosalpinx in an avascular area with the Metzenbaum scissors. We then pass a plain 0 suture and ligate the proximal part of the tube at about 2-3 cm from the cornua. We use another plain 0 to suture the distal part of the tube and then we cut the section of tube in between and send it to pathology. Electrocautery is used to complete hemostasis at the two cut ends of the tube on the right side. We proceed exactly the same  way on the left side. The tube is well identified down to the fimbria on that side as well. Hemostasis is adequate. We remove the retractors. We then close the aponeurosis with Vicryl 0 using 2x stitches that closes the aponeurosis completely. We then verify hemostasis at the adipose tissue. It is adequate. We had injected Marcaine 0.25% plain, 10 cc, at the beginning of the surgery. We then reapproximate the skin with a subcuticular suture with Vicryl 4-0. Hemostasis is adequate at the skin. Count of instruments and compresses was adequate x 2. Estimated blood loss was minimal. No complication occurred, and the patient was transferred to recovery room in good status. DD:  11/10/00 TD:  11/10/00 Job: [id number]
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
## 2010-12-01 NOTE — Op Note (Signed)
[place]  Patient:    Alexis Christensen                         MRN: [id number] Proc. Date: 08/09/99 Adm. Date:  [id number] Attending:  Ronne Binning CC:         Nicki Reaper, M.D. (2)                           Operative Report  PREOPERATIVE DIAGNOSIS:  ___________ deformity, right wrist.  POSTOPERATIVE DIAGNOSIS:  ___________ deformity, right wrist.  OPERATION:  Arthroscopy with capsullary shrinking, right wrist.  SURGEON:  Nicki Reaper, M.D.  ASSISTANT:  Joaquin Courts, R.N.  ANESTHESIA:  General.  ANESTHESIOLOGIST:  Edwin Cap. Zoila Shutter, M.D.  HISTORY:  The patient is a 40 year old female with a history of wrist problems.  She has undergone partial fusion of her wrist.  She has a ___________ deformity. She is brought in for capsullary shrinking arthroscopically.  DESCRIPTION OF PROCEDURE:  The patient was brought to the operating room where  general anesthesia was carried out without difficulty.  She was prepped and draped using Betadine scrub and solution, with the right arm free.  The limb was placed in the arthroscopy tower, ten pounds of traction applied, the joint inflated through the 3-4 portal.  The joint inspected after making a small transverse incision through the skin, deepening this with a Hemostat.  One trocar was used to enter the joint.  The joint was inspected.  A moderate synovitis was present.  The triangular fibrocartilage showed a degenerative-type tear.  The ulnar head was visible but  showed no chondromalacia.  There was no defects on the lunate triquetrum.  A 6-U portal was used for irrigation.  Also a 4-5 portal was then made after localization with a 22 gauge needle.  An ArthroWand was inserted.  The TFCC tear was further  debrided.  A capsular shrinking was then performed on the volar ulnar wrist ligaments and triangular fibrocartilage complex.  The scope was introduced in the 4-5 portal.  Further  shrinking was performed with the ArthroWand in the 3-4 portal. The midcarpal joint was then inspected through a distal 3-4 portal.  A significant laxity of the ulnar ligaments were present.  The ulnar portals were set for both irrigation and for the ArthroWand which were inserted through, again, small transverse incision, deepened with Hemostat.  Blunt trocar used to enter the joint. The capsular shrinking was then performed onto the ulnar wrist ligaments from the midcarpal joint.  This showed significantly greater stability once this was done. The dorsal attachment of the dorsal intercarpal ligament and radial carpal ligament was then shrunk.  Its attachment to the lunate provided further dorsal stability. The instruments were removed.  The portals closed with interrupted 5-0 nylon sutures.  Sterile compressive dressing and dorsal palmar splint applied.  The patient tolerated the procedure well and was taken to the recovery room for  observation in satisfactory condition.  She is discharged home to return to [facility] in one week, n Vicodin and Septra DS. DD:  08/09/99 TD:  08/09/99 Job: [id number]
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
## 2010-12-01 NOTE — Op Note (Signed)
[facility]  Patient:    Alexis Christensen, Alexis Christensen                        MRN: [id number] Proc. Date: 01/03/00 Adm. Date:  [id number] Disc. Date: [id number] Attending:  Genia Del                           Operative Report  PREOPERATIVE DIAGNOSIS:       Stable left adnexal cyst, severe pelvic pain refractory to medical treatment, history of endometriosis.  POSTOPERATIVE DIAGNOSIS:      Stable left adnexal cyst, severe pelvic pain refractory to medical treatment, history of endometriosis.  No left ovarian cyst and complexes adhesions between the left ovary and the sigmoid.  Mild pelvic endometriosis.  OPERATION:                    Open laparoscopy with lysis of adhesions.  SURGEON:                      Genia Del, M.D.  ASSISTANT:                    Cordelia Pen A. Rosalio Macadamia, M.D.  ANESTHESIA:                   Belva Agee, M.D.  ESTIMATED BLOOD LOSS:  DESCRIPTION OF PROCEDURE:     Under general anesthesia with endotracheal intubation, the patient is in lithotomy position.  She is prepped with Betadine on the abdominal, suprapubic, vulvar, and vaginal areas.  A bladder catheterization is done and the patient is draped as usual.  Vaginally, a speculum is introduced and the uterus is cannulated.  The speculum is removed.  Abdominally, we proceeded ith an open technique.  An infraumbilical incision over 10 mm is done with a scalpel. The aponeurosis is opened with the Mayo scissors and the abdominal cavity is entered.  The trocar and then the laparoscope were introduced.  We secured the trocar with Vicryl 0 that was placed at the level of the aponeurosis on each side. Brief exploration of the pelvic cavity reveals no adhesions anteriorly.  A second entry site is therefore done with a 5 mm trocar and a probe is inserted at the suprapubic area.  We inspected the pelvic cavity.  The uterus is normal in volume and appearance.  The two tubes are  normal in appearance with good fimbria.  The  right ovary is completely normal and mobile.  The left ovary presents fine and moderately dense adhesions with the sigmoid colon and with the ovarian fossa. o active lesion of endometriosis is seen, but evidence of retraction is present in the posterior cul-de-sac.  A third entry is made at the level of the left iliac  fossa.  We made a 5 mm incision with the scalpel and introduced the 5 mm trocar.  A scissors with unipolar cauterization and an atraumatic clamp are used to release the adhesions between the ovary and the bowel and at the level of the ovarian fossa.  The ovary at the end of the procedure is pretty mobile.  No evidence of  cyst is present.  We verified the hemostasis which is adequate.  We irrigated and aspirated the pelvic cavity.  We removed the instruments.  The CO2 was evacuated. The sponge, needle, and instrument counts were correct.  The estimated blood loss  was minimal.  The umbilical incision was closed at the level of the aponeurosis  with Vicryl 0 and then the skin was reapproximated at all incisions with Monocryl 4-0.  Infiltration with Marcaine 0.25% was done at the subcutaneous tissue. Dressings were applied.  The vaginal instruments were removed.  The patient had no complications and was transferred to the recovery room in good status. DD:  01/11/00 TD:  01/12/00 Job: [id number]

## 2010-12-01 NOTE — Op Note (Signed)
NAME:  Alexis Christensen, Alexis Christensen                           ACCOUNT NO.:  [id number]   MEDICAL RECORD NO.:  [id number]                   PATIENT TYPE:  AMB   LOCATION:  DAY                                  FACILITY:  [facility]   PHYSICIAN:  Sharlet Salina T. Hoxworth, M.D.          DATE OF BIRTH:  08/21/1970   DATE OF PROCEDURE:  04/27/2003  DATE OF DISCHARGE:                                 OPERATIVE REPORT   PREOPERATIVE DIAGNOSIS:  Biliary dyskinesia/chronic cholecystitis.   POSTOPERATIVE DIAGNOSIS:  Biliary dyskinesia/chronic cholecystitis.   OPERATION/PROCEDURE:  Laparoscopic cholecystectomy with intraoperative  cholangiogram.   SURGEON:  Sharlet Salina T. Hoxworth, M.D.   ASSISTANT:  Anselm Pancoast. Zachery Dakins, M.D.   ANESTHESIA:  General.   BRIEF HISTORY:  Alexis Christensen is a 40 year old female who presents with two to  three weeks of persistent right upper quadrant abdominal pain radiating  toward her chest and mid abdomen.  This has been associated with nausea.  She has presented to the [facility] Emergency Room on two different  occasions.  She has had a gallbladder ultrasound that was negative and has  had a HIDA scan showing significantly decreased ejection fraction.  I  followed her in my office and her pain has not improved with followup and  upper GI series has been negative.  With these findings we elected to  proceed with laparoscopic cholecystectomy in an effort to relieve her  symptoms.  The nature, procedure, indications, risks of bleeding, infection,  bile leak, bile duct injury, and failure to relieve her pain were discussed  and understood preoperatively.  She is now brought to the operating room for  this procedure.   DESCRIPTION OF PROCEDURE:  The patient is brought to the operating room and  placed in the supine position on the operating table and general  endotracheal anesthesia was induced.  PS were in place.  She was given  preoperative antibiotics.  The abdomen was sterilely  prepped and draped.  The abdomen was accessed when opened with the Hasson technique at the  umbilicus and the standard port placement used.  At the gallbladder a few  omental adhesions were taken down.  It appeared slightly tense but not  distinctly abnormal.  The fundus was grasped and elevated up off the liver.  Both the liver and infundibulum were retracted inferolaterally.  Fibrofatty  tissue was stripped of the neck of the gallbladder toward the porta hepatis  and peritoneum anterior and posterior and Calot's triangle was incised and  Calot's triangle thoroughly dissected.  The cystic artery and the cystic  duct were identified.  The cystic duct was clipped at the gallbladder  junction and operative cholangiogram was obtained through the cystic duct  which showed good filling of normal  common bile duct and intrahepatic ducts  with free flow into the duodenum and no filling defects.  Following this,  the cystic duct was doubly clipped proximally and divided  as was the cystic  artery.  Gallbladder was dissected free from its bed with hook cautery and  moved intact from the umbilicus.  The right upper quadrant was irrigated and  complete hemostasis assured.  Trocars were removed under direct vision.  All  CO2 evacuated from the peritoneal cavity.  The mattress suture was secured  to the umbilicus.  Skin incisions were closed with interrupted subcuticular 4-0 Monocryl and  Steri-Strips.  Sponge, needle and instrument counts were correct.  Dry  dressings were applied.  The patient was taken to the recovery room in good  condition.                                                 Lorne Skeens. Hoxworth, M.D.    Tory Emerald  D:  04/27/2003  T:  04/27/2003  Job:  [id number]

## 2010-12-01 NOTE — H&P (Signed)
NAME:  Alexis Christensen, Alexis Christensen                           ACCOUNT NO.:  [id number]   MEDICAL RECORD NO.:  [id number]                   PATIENT TYPE:  INP   LOCATION:  0102                                 FACILITY:  [facility]   PHYSICIAN:  Sherin Quarry, MD                   DATE OF BIRTH:  12-20-1970   DATE OF ADMISSION:  11/19/2003  DATE OF DISCHARGE:                                HISTORY & PHYSICAL   Alexis Christensen is a 40 year old lady with a longstanding history of  psychiatric problems and previous overdose attempts who apparently today  took eleven Ativan, some Geodon tablets, Skelaxin 800 mg two or three, and  Tylenol with codeine. After doing this, she called a friend about 4:30 this  afternoon and eventually was transported to the [facility] emergency room.  There, she was lethargic, but alert, and was able to take Charcoal by mouth.  At the time that I came to see her, no one was with her. I tried to contact  family members at the number I was supplied and no one answered the phone.  Therefore, I could not obtain any additional information. In light of this  history of apparent suicide attempt, the patient will be admitted for  observation and subsequent psychiatric evaluation.   PAST MEDICAL HISTORY:  This information is obtained solely from medical  records.   ALLERGIES:  The patient is said to be allergic to PENICILLIN and  STRAWBERRIES.   MEDICATIONS:  These are unknown. On the discharge summary from a  hospitalization earlier this year her medications were listed as Vistaril 50  mg q.6h. p.r.n., lithium carbonate 300 mg q.a.m. and 600 mg at bedtime,  Seroquel 50 mg in the morning and 50 mg in the evening and 450 mg at  bedtime, Paxil 40 mg every morning, Motrin 400 mg t.i.d. p.r.n.   OPERATION:  The patient is listed as having had a wrist surgery in January  2001, ankle reconstruction in 1993 and again in 1997, and a laparoscopy for  endometriosis which apparently was done in  2001.  She has also had two  children and one spontaneous abortion.   MEDICAL ILLNESSES:  None are mentioned in the records.   FAMILY HISTORY:  Cannot be obtained.   SOCIAL HISTORY:  The records indicate that she is married. She smokes about  a half pack of cigarettes per day. She is said to occasionally drink  alcohol, but not to excess. As mentioned previously she has two children who  are girls.   REVIEW OF SYSTEMS:  Cannot obtained. All I could learn from talking to the  patient was that she denied experiencing any pain.   PHYSICAL EXAMINATION:  HEENT:  Within normal limits.  CHEST: Clear to auscultation and percussion.  CARDIOVASCULAR: Normal S1 and S2 without murmurs, rubs, or gallops.  ABDOMEN: Benign with normal bowel sounds without masses  or tenderness.  NEUROLOGIC: The patient would follow commands, although she was quite  lethargic. She was able to move all extremities to commands. She was able to  squeeze my fingers. She had normal diffuse reflexes. Babinski's downgoing.  Station and gait could not be tested.   Relevant laboratories obtained included a urine drug screen which was  nondetected. BMET showed a sodium of 138, potassium 3.7, chloride 108, CO2  25, glucose 118, creatinine 0.7. Lithium level was less than 0.25 possibly  consistent with noncompliance.   IMPRESSION:  1. Overdose of multiple drugs as outlined above.  2. History of major depression.  3. History of benzodiazepine abuse.  4. History of personality disorder.  5. History of migraine headaches.  6. Possible past history of hypothyroidism.  7. History of wrist and ankle reconstruction.  8. History of laparoscopic endometriosis surgery and allergy to PENICILLIN.   The patient will be admitted at this time for evaluation and observation.  Once she is medically stable a psychiatric evaluation will be requested. In  the meantime will make sure a sitter is present. Will give her intravenous  fluids.  Will initiate diet when she is more alert.                                               Sherin Quarry, MD    SY/MEDQ  D:  11/19/2003  T:  11/20/2003  Job:  [id number]   cc:   Jeanice Lim, M.D.  Fax: [phone]

## 2010-12-01 NOTE — Op Note (Signed)
[place]  Patient:    Alexis Christensen, Alexis Christensen Visit Number: [id number] MRN: [id number]          Service Type: DSU Location: [facility] Attending Physician:  Ronne Binning Dictated by:   Nicki Reaper, M.D. Admit Date:  05/27/2001                             Operative Report  PRINCIPLE DIAGNOSIS:  ______ deformity right wrist.  POSTOPERATIVE DIAGNOSIS:  ______ deformity right wrist.  OPERATION:  Fusion with iliac crest bone graft right wrist.  SURGEON:  Nicki Reaper, M.D.  ASSISTANT:  Joaquin Courts, R.N.  ANESTHESIA:  General.  ANESTHESIOLOGIST:  Janetta Hora. Gelene Mink, M.D.  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  PROCEDURE:  The patient was brought to the operating room where general endotracheal intubation anesthesia was carried out without difficulty.  She was prepped and draped using Betadine scrub and solution.  In the supine position with the right arm free, the limb was exsanguinated with an Esmarch bandage.  The tourniquet was placed high on the arm and was inflated to 250 mmHg.  A straight incision was made over Lister tubercle and carried up to the third metacarpal proximally to the distal forearm, carried down through the subcutaneous tissues.  Bleeders were electrocauterized and neurovascular structures were protected.  The dissection was carried down to the radius and to the carpus through the 3/4 interval.  The fourth dorsal compartment was elevated.  The second compartment was elevated.  The Lister tubercle was removed.  The joint was opened.  Significant degeneration of the midcarpal joint was identified.  Rongeurs, osteotomes and burs were then used to remove the cartilage from the distal radius, proximal scaphoid and lunate, distal scaphoid  and lunate triquetrum the hamate and capitate distally, the scaphoid lunate triquetrum distally and the interval in the carpal metacarpal joints in the index and middle fingers.  A separate incision was then made over the prepped iliac crest on the right side.  The incision was made and carried down through the subcutaneous tissues and the bleeders were then electrocauterized and dissection was carried down to the iliac crest.  The interval between the abductor and hip flexors was opened.  An osteotome was used to remove the dorsal cortex.  Curet was then used to remove cancellous bone.  It was noted that the iliac crest was quite narrow, however, adequate bone was removed along with the window which was morcellized.  This was packed.  The wound on the wrist was then copiously irrigated with Bacitracin containing solution.  The bone was then packed into each of the denuded areas of the proximal mid carpal joint and CMC joints.  A short bend Synthes wrist fusion plate was then selected.  A rongeur was then used to groove the distal radius to allow the plate to set in position.  This was then set over the third metacarpal and onto the distal radius.  A 14 mm screw was then inserted into the metacarpal distally after drilling with a 2 mm drill. Measuring this, the screw was placed fixing the plate.  The plate was then arranged using the OEC onto the radius and third metacarpal and the remaining screws were placed placing compression  on the proximal aspect. The screws measured between 14 and 22 mm.  Each was placed.  Position was confirmed with AP and lateral x-rays.  The wounds were copiously irrigated with Bacitracin containing saline solution.  The capsule and retinaculum and compartments were closed with figure-of-eight 4-0 Vicryl sutures.  The subcutaneous tissues with 4-0 Vicryl and the skin with subcuticular 3-0 monocryl sutures.  Steri-Strips were applied.  Sterile compressive  dressing applied.  The hip was closed in layers with 0 Vicryl, 2-0 Vicryl and 3-0 monocryl.  Steri-Strips were applied and sterile compressive dressing applied to the hip.  A splint applied to the wrist.  The patient tolerated the procedure well and was taken to the recovery room for observation in satisfactory condition.  She is admitted for overnight stay for pain control. She will be discharged on Percocet and Keflex. Dictated by:   Nicki Reaper, M.D. Attending Physician:  Ronne Binning DD:  05/27/01 TD:  05/27/01 Job: [id number]
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
## 2010-12-01 NOTE — Discharge Summary (Signed)
Alexis Christensen, Alexis Christensen NO.:  [id number]   MEDICAL RECORD NO.:  [id number]          PATIENT TYPE:  IPS   LOCATION:  0502                          [facility]   PHYSICIAN:  Anselm Jungling, MD  DATE OF BIRTH:  [birth year]   DATE OF ADMISSION:  03/17/2005  DATE OF DISCHARGE:  03/26/2005                                 DISCHARGE SUMMARY   IDENTIFYING DATA AND REASON FOR ADMISSION:  The patient is a 40 year old  unmarried Caucasian female who was admitted on a voluntary basis.  She had  presented in the emergency department after an overdose on Depakote.  She  cited several stressors including being the mother of 2 young children, work  and financial issues.  She reported increased depression, irritability,  crying and decreased sleep and appetite.  Please refer to the admission note  for further details pertaining to the symptoms, circumstances and history  that lead to her hospitalization at [facility].   She had been under the care of Dr. Jenne Campus at [facility], and had been  seeing a counselor through the [employer].   She was given an Axis I initial diagnosis of bipolar disorder, mixed,  without psychotic features.   MEDICAL AND LABORATORY:  The patient has a history of gallbladder disease,  partial hysterectomy, reconstruction of left foot and orthopedic surgery to  her right wrist.  The patient was physically assessed prior to the beginning  of her psychiatric treatment.  Admission laboratory included a CBC that  showed decreased RBCs at 3.80, decreased hemoglobin 11.3, decreased  hematocrit at 32.6, and a routine chemistry panel which was within normal  limits with the exception decreased albumin at 3.2.  Liver function tests  were within normal limits.  A TSH level was low at 0.011.  Urine pregnancy  test was negative.  A urinalysis was within normal limits.  A valproic acid  level on March 18, 2005 was 48.3.   HOSPITAL COURSE:  The  patient was admitted to the adult inpatient  psychiatric service.  She was initially continued on her previous Depakote  1000 mg q.a.m. and trazodone at h.s. on a p.r.n. basis for sleep.  Imitrex  was available for migraine headache.  This was insufficient, and Demerol 50  plus Phenergan 25 IM was given for relief of headache which was more  successful.   Later in her stay the patient refused Depakote.  A trial of Lamictal 25 mg  daily was begun.  The patient developed mild rash and because of this  Lamictal was discontinued.  She also continued to have migraine headache.  Toradol was given on a trial basis, but this was ineffective as well.   Later, Risperdal 1 mg p.o. q.h.s. was given to address insomnia and anxiety  which was more successful.   Over the course of her inpatient stay, she was a good participant in the  treatment program.  She did complain of mood lability, which the Risperdal  was geared to address and did so with some success.   Prior to discharge, the patient  had a family session involving her ex-mother-  in-law, her friend, and her biological mother on the phone.  There were  discussions about concerns regarding the patient's medication noncompliance  history and her ability to care for her children.  The patient gave  reassurances that she would be able to cooperate with her medications and  adhere to her regimen.  She pledged to follow up with outpatient counseling  and medication psychiatric visits.  The counselor recommended a bipolar  support group for the patient and family preservation therapy of an in-home  nature.   AFTERCARE:  The patient was to follow up with Dr. Jenne Campus on April 17, 2005, and with therapist, Beatrice Lecher,  to be arranged at the time of  discharge.   DISCHARGE MEDICATIONS:  Risperdal 0.5 mg 1 q.a.m. and 2 q.h.s.   DISCHARGE DIAGNOSES:  AXIS I:  Bipolar disorder, mixed, without psychotic  features.  AXIS II:  Deferred.  AXIS  III:  History of migraine headache.  AXIS IV:  Stressors severe.  AXIS V:  Global assessment of function on discharge 63.           ______________________________  Anselm Jungling, MD  Electronically Signed     SPB/MEDQ  D:  04/13/2005  T:  04/13/2005  Job:  [id number]

## 2010-12-01 NOTE — Discharge Summary (Signed)
NAME:  Alexis Christensen, Alexis Christensen                           ACCOUNT NO.:  [id number]   MEDICAL RECORD NO.:  [id number]                   PATIENT TYPE:  INP   LOCATION:  0374                                 FACILITY:  [facility]   PHYSICIAN:  Melissa L. Ladona Ridgel, MD               DATE OF BIRTH:  1971-06-07   DATE OF ADMISSION:  11/19/2003  DATE OF DISCHARGE:  11/21/2003                                 DISCHARGE SUMMARY   ADMITTING DIAGNOSES:  1. Drug overdose.  2. Depression.  3. Benzodiazepine abuse.  4. Tobacco abuse.   DISCHARGE DIAGNOSES:  1. Multi-drug overdose, now medically cleared for psychiatric care.  2. Major depression.  Needs inpatient hospital stay for further care.  3. Tobacco abuse.  4. Migraine headaches.  5. Benzodiazepine abuse.   MEDICATIONS AT THE TIME OF DISCHARGE:  1. Celexa 40 mg daily.  2. Thiamine 100 mg p.o. daily.  3. Tylenol 650 mg p.o. q.4-6h. p.r.n.  4. Ativan 1 mg IV or p.o. q.6h. p.r.n. for severe agitation.  5. Percocet 1 tablet p.o. q.4-6h. p.r.n. for pain.   PERTINENT ALLERGIES:  PENICILLIN.   HISTORY OF PRESENT ILLNESS:  The patient is a 40 year old white female with  a past medical history for multiple drug overdoses.  She carries a history  of major depressive disorder and a possible diagnosis of bipolar disorder.  She has been labeled also with a personality disorder.  On the day of  admission to the emergency room, the patient was noted to have taken 11  Ativan, several tablets of Geodon, several tablets of Skelaxin 800 mg, and  Tylenol with codeine.  She then called a friend around 4:30, and was brought  to the emergency room, where she was found to be lethargic but arousable.  The emergency room intervened by giving her charcoal, and she was admitted  to the ICU for further observation and hydration.  Upon awakening from her  stupor, the patient appeared hypomanic, laughing inappropriately.  She  stated on examination, Did I really take that many  tablets?  They have  never needed to give me charcoal before.  She expressed inappropriate  responses to the serious nature of her attempt, and felt that she should  just be able to go home.  Her hospital course was relatively unremarkable.  She showed no cardiac dysrhythmia during her ICU stay.  Her pertinent  laboratory values during the course of the evaluation were a lithium level  of less than 0.25, and ethanol level of 6, and admission BUN of 3, and a  creatinine of 0.7.  Her Tylenol and salicylate levels were negative.  Her  urine drug screen revealed no illicit drugs of abuse.  Her thyroid level was  within normal limits at 2.43.  A repeat complete metabolic panel on Nov 20, 2003 revealed a BUN of 2, and a creatinine of 0.7.  Her liver function  studies were within normal limits, and a final set of liver function studies  revealed a slight elevation in her total bilirubin of 1.4 of insignificant  consequences.  During the course of the hospitalization, the patient did  complain of a migraine headache, and was given a one-time dose of Demerol  and Phenergan, with some relief.  Her medical conditions are otherwise  stable, and she is cleared for psychiatric care in the inpatient setting.   PHYSICAL EXAMINATION ON DISCHARGE:  VITAL SIGNS:  On the day of discharge,  her vital signs revealed a blood pressure of 110/61, temperature 98, pulse  of 64, respirations of 20.  She ranges from 92% to 97% on room air.  GENERAL:  She is a well-developed, well-nourished, moderately obese white  female in no acute distress.  Pupils equal, round and reactive to light.  Extraocular muscles are intact.  Mucous membranes are moist.  NECK:  Supple.  There is no JVD, no lymph nodes.  She has no carotid bruits.  CHEST:  Clear to auscultation with no rhonchi, rales, or wheezes.  ABDOMEN:  Soft, nontender, nondistended, with positive bowel sounds.  EXTREMITIES:  No edema, and 2+ pulses.  NEUROLOGIC:  She is  nonfocal.   The patient was evaluated by Dr. Kathrynn Running during her brief hospital course.  His evaluation supported the recommendation for inpatient hospital care.  At  the time of discharge, we have continued her on Celexa 40 mg p.o. daily, and  have provided supplemental thiamine by mouth.  She is stable from a medical  standpoint, but will need her psychiatric needs met in the inpatient  setting, so at this time, we are recommending discharge to inpatient  psychiatric care.                                               Melissa L. Ladona Ridgel, MD    MLT/MEDQ  D:  11/21/2003  T:  11/21/2003  Job:  [id number]   cc:   Cena Benton(?), M.D.   Jeanice Lim, M.D.  Fax: [phone]
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
## 2010-12-01 NOTE — H&P (Signed)
[facility]  Patient:    Alexis Christensen, Alexis Christensen                        MRN: [id number] Adm. Date:  [id number] Disc. Date: [id number] Attending:  Genia Del                         History and Physical  DATE OF BIRTH:                Nov 04, 1970  IDENTIFICATION:  The patient is a 40 year old, G3, P1, A1, last menstrual period February 04, 2000, with expected date of delivery by Nov 19, 2000, eight days difference in the first trimester, at 37 and 6 days gestation.  REASON FOR ADMISSION:         Induction, recent severe gastrointestinal problems.  HISTORY OF PRESENT ILLNESS:   The patient complained of severe nausea and vomiting with intermittent diarrhea for the past two weeks.  She was even rehydrated with an IV in the past week.  Complains also of gastroesophageal reflux with severe heartburn, not controlled with antacids.  Fetal movements positive.  No vaginal bleeding, no fluid leak.  Good fetal movements.  No PIH symptoms.  No evidence of viral illness.  No fever.  PAST MEDICAL HISTORY:         History of hypothyroidism but euthyroidism during pregnancy.  No medication.  Borderline diabetes.  No medication needed, having diabetic diet.  Bronchial asthma.  PAST SURGICAL HISTORY:        Wrist surgery, January 2001.  Ankle reconstruction in 1993 and 1997.  Laparoscopy with lap ovarian and cystectomy June 2001.  PAST OBSTETRICAL HISTORY:     February 1996, 40 weeks induction for spontaneous rupture of membranes, baby girl 7 pounds 5 ounces, no complications.  April 2001, 46 weeks, spontaneous complete abortion, no complication.  PAST GYNECOLOGIC HISTORY:     Endometriosis and surgery for a left ovarian cyst by laparoscopy.  ALLERGIES:                    PENICILLIN and STRAWBERRIES.  SOCIAL HISTORY:               Married.  Nonsmoker.  MEDICATIONS:                  Prenatal vitamins and antacids.  HISTORY OF PRESENT PREGNANCY:                     First trimester, mild vaginal bleeding for which an ultrasound was done confirming a single uterine pregnancy for which the date was changed.  She also had a thigh neuritis for which Ceftin was given for 10 days.  LABORATORY DATA:              Labs in the first trimester showed Pap smear within normal limits.  Urine culture negative.  Hemoglobin 12.7, PLA 349. HBsAg negative.  Rubella-immune.  RPR negative.  HIV negative.  O positive. Antibody negative.  TSH within normal limits.  Chlamydia and gonorrhea negative.  In the second trimester the triple test was within normal limits and TSH was repeated within normal limits.  Ultrasound review anatomy was done around 24 weeks at [facility], within normal limits, normal placenta, normal fluid.  At 28+ weeks a one-hour GTT was within normal limits. Hemoglobin was 10.9.  Patient was advised iron supplement.  In the  third trimester, the patient had severe problems with gastroesophageal reflux and nausea and vomiting.  She was also treated for cystitis with Macrobid and Cipro.  Group B strep at 35+ weeks was negative.  Ultrasound for ______ and aminiotic fluid index was within normal limits at 32+ weeks.  Blood pressures remain within normal limits until the end of pregnancy.  REVIEW OF SYSTEMS:            CONSTITUTIONAL:  Negative.  HEENT:  Negative. CARDIOVASCULAR:  Negative.  RESPIRATORY:  Negative.  GASTROINTESTINAL:  See history of present illness.  UROLOGICAL:  Negative.  DERMATOLOGIC:  Negative. NEUROLOGIC:  Negative.  ENDOCRINE:  Negative.  PHYSICAL EXAMINATION:  VITAL SIGNS:                  Stable with normal blood pressure.  Regular pulse.  Normal temperature.  LUNGS:                        Clear bilaterally.  HEART:                        Regular cardiac rhythm.  No murmur.  ABDOMEN:                      Gravid with a uterine height around 38 cm, cephalic presentation.  PELVIC:                       Vaginal examination,  cephalic.  Cervix is 2+ high, 70% effaced, posterior vertex -2.  EXTREMITIES:                  Lower limb, mild edema.  Monitoring fetal heart rate 140 per minute.  NST reactive.  No deceleration. Uterine contractions, irregular.  IMPRESSION:                   Gravida 3, para 1, abortus 1, at 38 weeks and 6 days, with severe gastrointestinal problems.  Group B streptococcus negative.  PLAN:                         Admit to labor and delivery, elective induction. AROM plus Pitocin and monitoring.  Expectant management was probable vaginal delivery.  Note that patient is requesting a tubal sterilization.  Will see if that can be done postpartum.  If not, will schedule as an outpatient by laparoscopy. DD:  11/10/00 TD:  11/10/00 Job: [id number]

## 2010-12-01 NOTE — Op Note (Signed)
[facility]  Patient:    Alexis Christensen, Alexis Christensen                        MRN: [id number] Proc. Date: 02/20/01 Adm. Date:  [id number] Attending:  Ronne Binning                           Operative Report  PREOPERATIVE DIAGNOSIS:  Status post arthroscopy, VISI deformity right wrist.  POSTOPERATIVE DIAGNOSIS:  Status post arthroscopy, VISI deformity right wrist.  OPERATION:  Arthroscopy for diagnosis, inspection of proximal lunate, proximal capitate - right wrist.  SURGEON:  Nicki Reaper, M.D.  ASSISTANT:  Joaquin Courts, R.N.  ANESTHESIA:  General.  DATE OF OPERATION:  February 20, 2001  ANESTHESIOLOGIST:  Kaylyn Layer. Michelle Piper, M.D.  HISTORY:  The patient is a 40 year old female with a long history of wrist problems.  She has had unremitting pain.  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  She is admitted for arthroscopic inspection ______ right wrist.  PROCEDURE:  The patient was brought to the operating room where a general anesthetic was carried out without difficulty.  She was prepped and draped using Betadine scrub and solution with the right arm free and in supine position.  The limb was placed in the arthroscopy tower, the joint inflated to the 3-4 portal, the joint inspected via 3-4 portal after making a transverse incision through skin only, deepened with a hemostat.  A blunt trocar was used to enter the joint, joint was inspected.  An irrigation catheter was placed in 6U.  The radial scaphoid, proximal scaphoid, scapholunate ligaments were intact.  The lunate facette of the distal radius showed no significant changes.  The proximal lunate showed chondromalacia.  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  The triangular fibrocartilage debridement was noted to be intact.  There were no changes on the ulnar head. The 4-5 portal was opened after localization with a 22 gauge needle.  This was deepened with a hemostat, a  blunt trocar used to enter the joint, joint inspected from the ulnar side.  Significant changes were present on the ulnar aspect of the chondromalacia over at least one-half of the proximal lunate. Midcarpal joint was then inspected through the distal 3-4 portal, the irrigation catheter placed through the distal 4-5 portal, the joint inspected after making a portal similar to the 3-4.  The STT joint showed no changes. Proximal capitate showed significant changes along with the proximal hamate. The instruments were removed and debridement was performed to both joints using a full radius shaver.  The proximal capitate was debrided of loose cartilage.  The proximal lunate was debrided of loose cartilage.  The instruments were removed, the joint irrigated, and portals closed with interrupted 5-0 nylon sutures.  Sterile compressive dressing was applied. The patient tolerated the procedure well and was taken to the recovery room for observation in satisfactory condition.  DISPOSITION:  She is discharged home to return to [facility] [place] in one week on Vicodin and Septra DS. DD:  02/20/01 TD:  02/21/01 Job: [id number]
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
## 2010-12-01 NOTE — Discharge Summary (Signed)
[facility]  Patient:    Alexis Christensen, Alexis Christensen                        MRN: [id number] Adm. Date:  [id number] Disc. Date: [id number] Attending:  Silverio Lay A                           Discharge Summary  REASON FOR ADMISSION:         Intrauterine pregnancy at 19+ weeks with severe migraine.  HISTORY/HOSPITAL COURSE:      This is a 40 year old married white female gravida 3, para 1, abortus 1 at 19+ weeks pregnancy, being admitted with complaints of severe headache for the last four days without any relief at home.  She reported a left-sided retro-orbital migraine with sinus pressure radiating to the back of her head and neck for which she had found minor relief with the use of Tylenol with codeine or even Motrin 800 mg.  She denied any other neurological associated symptoms besides photophobia and mild nausea.  She was admitted for evaluation, observation and pain management which she did not respond to.  A neurological consult was asked, and the patient was seen by C. Lesia Sago, M.D., on July 04, 2000, with a possible diagnosis of sinus headache.  He did agree with the current plan of antibiotics and requested to wait the next 24 hours to see if headache would improve. Since the headache did not improve, we preceded with CT scan of the head which came back completely normal.  However, the patient, on July 05, 2000, reported great improvement in her headache and requested discharge home.  She was discharged home on Z-pack with Nasacort b.i.d.  She will follow in the office with her primary care physician within one week.  FINAL DIAGNOSES:              1. Intrauterine pregnancy at 19+ weeks.                               2. Possible acute sinusitis with referred                                  headache.  PLAN:                         Continue Z-pack.  Follow in the office in one week.  Call if increased symptoms or recurrence of fever. D:   08/12/00 TD:  08/12/00 Job: [id number]

## 2010-12-01 NOTE — Op Note (Signed)
NAME:  Alexis Christensen, Alexis Christensen                           ACCOUNT NO.:  [id number]   MEDICAL RECORD NO.:  [id number]                   PATIENT TYPE:  OBV   LOCATION:  0478                                 FACILITY:  [facility]   PHYSICIAN:  Laqueta Linden, M.D.                 DATE OF BIRTH:  04/18/1971   DATE OF PROCEDURE:  07/07/2002  DATE OF DISCHARGE:                                 OPERATIVE REPORT   PREOPERATIVE DIAGNOSES:  Dysfunctional uterine bleeding, pelvic pain,  fibroids, rule out adenomyosis.   POSTOPERATIVE DIAGNOSES:  Dysfunctional uterine bleeding, pelvic pain,  fibroids, rule out adenomyosis.   PROCEDURE:  Transvaginal hysterectomy.   SURGEON:  Laqueta Linden, M.D.   ASSISTANT:  Andres Ege, M.D.   ANESTHESIA:  General endotracheal.   ESTIMATED BLOOD LOSS:  200 cc.   FLUIDS:  2 liters Crystalloid.   URINE OUTPUT:  300 cc.  In/Out catheterized at conclusion of procedure.   COUNTS:  Counts correct x 2.   COMPLICATIONS:  None.   INDICATIONS:  The patient is a 40 year old gravida 3, para 2 white female  with abnormal uterine bleeding since the birth of her second child 19 months  ago.  She has known fibroids noted on ultrasound with negative  sonohysterogram with no evidence of a submucosal fibroid or polyp.  She is  felt to likely have adenomyosis as well.  She was laparoscoped prior to the  birth of her last child for pelvic pain, which revealed a small amount of  endometriosis with a few left adnexal adhesions.  She has had persistent  abnormal uterine bleeding unresponsive to medical management.  She has had  associated pelvic pain on both with and without the bleeding.  She currently  is on high dose birth control pills with cessation of the bleeding for the  past week only.  She has attempted medical management since the birth of her  last child.  She was offered various management alternatives, including  uterine artery embolization with or without  endometrial ablation versus  hysterectomy and she chose the latter.  She has seen informed consent film,  voiced her understanding and acceptance of all risks, including, but not  limited to an infusion risk, infection, bleeding possibly requiring  transfusion, injury to bowel, bladder or ureters, vessels, nerves,  possibility of fistula formation, other postoperative complications  including DVT, which is increased slightly due to her pill use, as well as  PE, pneumonia, death, and other unnamed risks.  She has been counseled  regarding recovery expectations, return to work, and sexual functioning  postoperatively, as well as permanent and irreversible sterilization.  She  voiced her understanding and acceptance of all risks and agreed to proceed.  She received Ancef 1 g IV antibiotic prophylaxis preoperatively.   DESCRIPTION OF PROCEDURE:  The patient was taken to the operating room and  after proper identification  and consent was ascertained, she was placed on  the operating table in the supine position.  After the induction of general  endotracheal anesthesia, she was placed in the [place] stirrups in the dorsal  lithotomy position and the perineum and vagina were prepped and draped in a  routine sterile fashion.  The bladder was emptied with a red rubber  catheter.  A weighted speculum was placed in the posterior vagina and the  cervix was grasped with a single-tooth tenaculum.  The portio was injected  with a 1:100,000 solution of epinephrine circumferentially.  The posterior  cervix was then incised and it was bluntly advanced off of the cervix using  a finger with a Raytec sponge.  The cul-de-sac peritoneum was then entered  sharply using peritoneal scissors without problems.  The remainder of the  cervix was then circumscribed and the mucosa advanced off of the cervix.  The uterosacral ligaments were clamped, cut, ____ ligated with zero Vicryl  suture intact.  Cardinal ligaments  were similarly clamped, cut, and suture  ligated.  The anterior peritoneal reflection was then identified and entered  sharply without obvious injury or entry into the bladder.  The bladder was  retracted anteriorly out of the operative field.  Uterine vessels were then  clamped bilaterally incorporating both anterior and posterior peritoneum.  An additional pedicle on the broad ligament bilaterally was then taken.  At  this point, the uterine fundus was grasped with a towel clip and advanced  through the cul-de-sac.  It was grossly distorted by what eventually turned  out to be two large fibroids.  The uterine serosa was incised and the  fibroids were shelled out of the fundus with wedging out of the fibroids and  eventual removal of both fibroids with a marked diminution in the size of  the fundus allowing it to be delivered totally through the posterior cul-de-  sac.  At this point, clamps were placed across the proximal adnexal pedicles  bilaterally and the pedicles were cut and triply ligated with two free ties  and a stitch of zero Vicryl.  Both tubes and ovaries appeared completely  normal.  Hemostasis was noted to be excellent.  A McCall suture was placed  through the posterior vagina through uterosacral ligaments with plication of  the cul-de-sac to prevent enterocele formation.  The posterior vaginal  mucosa was roofed to the posterior peritoneum with marked improvement in  posterior cuff hemostasis.  The parietal peritoneum was then closed in a  purse-string fashion after adnexal pedicles were noted to be hemostatic and  released into the peritoneal cavity.  Counts were correct prior to closure  of the peritoneum.  The uterosacral ligaments tags were then tied in the  midline.  The vaginal cuff was then closed from side-to-side using  interrupted figure-of-eight sutures of zero Vicryl.  The McCall suture was  tied at the conclusion of the procedure.  Hemostasis was noted to  be excellent.  The bladder was emptied of greater than 300 cc of clear urine,  which had accumulated in the one hour procedure.  Needle, sponge, and  instrument counts were correct.  The patient was stable  and extubated on  transfer to the recovery room.  Estimated blood loss was 200 cc.  Fluids - 2  liters Crystalloid, urine output greater than 300 cc.  Of note, this procedure has a difficulty factor of 30% due to the enlarged  uterine fundus requiring maneuvers to debulk the uterus to allow  for vaginal  procedure.                                                Laqueta Linden, M.D.    LKS/MEDQ  D:  07/07/2002  T:  07/07/2002  Job:  [id number]   cc:   [facility]  [address]  [place], [place]

## 2010-12-01 NOTE — Consult Note (Signed)
[facility]  Patient:    Alexis Christensen, Alexis Christensen                        MRN: [id number] Adm. Date:  [id number] Attending:  Esmeralda Arthur CC:         [facility]. [facility]   Consultation Report                                This patient is a 40 year old right-handed white female born [birth year] with a history of sinus problems and is currently pregnant [redacted] weeks.  This patient has been in relatively good health.  Does complain of headaches on and off once every two months or so usually easily controlled with over-the-counter medications.  Patient believes that most of her headaches are sinus headaches.  Has a history of sinus infections in the past.  Patient began having a headache six days ago that began around the left eye associated with some throbbing pain.  Patients headache has included the inferior aspect of the face with some sensations into the teeth, maxilla, and left ear pain, some pain going back in the left occiput and neck.  Patient denies any vomiting, but does have some nausea.  Denies any visual field changes or focal numbness, weakness in the face, arms, or legs.  Patient has just recently been started on Zithromax one day ago.  Patient continues to have pain.  Does have a bit of purulent sinus drainage and presented with a slightly elevated white count around 11.8.  Patient denies fevers and chills prior to admission.  Patient denies any prior history of what was termed migraine.  PAST MEDICAL HISTORY:         1. History of new onset of headache, left                                  periorbital area.                               2. History of sinus disease.                               3. History of right wrist surgery in the past.                               4. History of left foot surgery in the past.                               5. History of endometriosis.  Patient has had      two laparoscopies in the past.  SOCIAL HISTORY:               Patient does not smoke or drink.  This patient is married.  Has one other child.  [place] in the [place].  Patient works in Clinical biochemist.  MEDICATIONS:                  Motrin p.r.n. and prenatal vitamins.  ALLERGIES:  PENICILLIN.  FAMILY HISTORY:               Both mother and father are alive and well. Patient has one sister and one brother, both alive and well.  Patient is adopted.  Family medical history is unknown.  REVIEW OF SYSTEMS:            Notable for some problems with some nausea. Patient does note some neck stiffness.  Denies any numbness per se of the arms and legs.  Has some discomfort going in the right arm today.  Feels slightly dizzy.  No staggering gait, blackout.  PHYSICAL EXAMINATION:  VITAL SIGNS:                  Blood pressure 118/66, heart rate 68, respiratory rate 20, temperature afebrile.  GENERAL:                      This patient is a moderately obese white female who is alert, cooperative at time of examination.  HEENT:                        Head is atraumatic.  Eyes:  Pupils are equal, round and reactive to light.  Disks are flat bilaterally.  NECK:                         Supple.  No carotid bruits noted.  RESPIRATORY:                  Clear to auscultation and percussion.  CARDIOVASCULAR:               Regular rate and rhythm without obvious murmurs or rubs noted.  EXTREMITIES:                  Without significant edema.  NEUROLOGIC:                   Cranial nerves as above.  Facial symmetry is present.  Patient has good sensation of face to pin prick and soft touch bilaterally.  Patient has good strength of facial muscles and the muscles of head turning, shoulder shrug bilaterally.  Patient has full visual fields. Speech is fully enunciated.  Patient has normal strength in all fours.  Good symmetric motor tone is noted  throughout.  Sensory testing is intact to pin prick, soft touch, vibratory sensation throughout.  Patient has finger-nose, finger-toe-finger bilaterally.  No pronator drift is seen.  Deep tendon reflexes are symmetric and normal.  Toes are neutral bilaterally.  LABORATORIES:                 Urinalysis with specific gravity 1.024, pH 5.0, 15 mg/dl ketones, otherwise negative.  White count 11.9, hemoglobin 11.9, hematocrit 33.1, platelets 229.  IMPRESSION:                   History of headache, rule out sinus headache versus migraine.                                This patient has several features that would make one lean a bit towards sinus headache.  Patient is admitted with a slightly elevated white count.  Has history of purulent drainage.  Tympanic membrane and middle ear area is somewhat erythematous by todays examination. Patient notes some dental sensations  with the headache.  Patient does describe a throbbing type headache which could represent migraine.  This patient has just started antibiotics in the last 24 hours.  Would continue this for another day or so and see if symptoms improve.  If so, would continue the antibiotics.  If not, we will pursue CT of the head to rule out sinus disease. Medications used for migraine during pregnancy are quite limited.  PLAN:                         1. Continue analgesics for pain.  Patient is                                  getting Tylenol and Demerol.                               2. Recheck CBC in a.m.  If no better, would                                  check CT of the head.  Will follow patient                                  while in-house. DD:  07/04/00 TD:  07/04/00 Job: [id number]

## 2010-12-01 NOTE — H&P (Signed)
NAME:  Alexis Christensen, Alexis Christensen NO.:  [id number]   MEDICAL RECORD NO.:  [id number]          PATIENT TYPE:  IPS   LOCATION:  0502                          FACILITY:  [facility]   PHYSICIAN:  Geoffery Lyons, M.D.      DATE OF BIRTH:  22-Jul-1970   DATE OF ADMISSION:  03/17/2005  DATE OF DISCHARGE:                         PSYCHIATRIC ADMISSION ASSESSMENT   IDENTIFYING INFORMATION:  This is a 40 year old single white female who was  voluntarily admitted to the [facility] on March 17, 2005.   HISTORY OF PRESENT ILLNESS:  The patient went to the [facility]  Emergency Department after having taken an overdose of Depakote and her  daughter could not arouse her.  The patient says that she does not have any  real passive suicidal ideation, she just wants to stop worrying about things  and thought that taking some extra Depakote would help.  The stressors in  her life are that she has two daughters at home, ages 27 and 50, work and  financial difficulty.  She reports that her mood recently has been very  irritable, easily angered.  She has been depressed, crying regularly.  She  only sleeps 2-3 hours per night.  She has decreased appetite and has lost  approximately 4-5 pounds over the last 2-3 weeks partly because of some  dental surgery she had just prior to the weight loss.  She reports that in  the past she has had thyroid problems, and she feels that maybe her thyroid  has gotten out of whack again because of the weight loss and some hair  falling out.  She reports that she had a manic episode most recently this  past Tuesday or Wednesday where she stayed up and worked puzzles all night  long and a manic episode a week prior to that where she stayed up all night  cleaning her house.   PAST PSYCHIATRIC HISTORY:  She has been inpatient at [facility] twice before, most recently in February 2005 and prior to that in  September of 2004.  She has had  intensive outpatient therapy at the  [facility] three times in the past.  She has also been  hospitalized at [facility] in 2005.  Her current psychiatrist is  a Dr. Jenne Campus in [facility] who has not yet met.  She has an  appointment to meet Dr. Jenne Campus on March 26, 2005.  She was previously a  patient of Dr. Hortencia Pilar at [facility].  She also reports  that she is supposed to be seeing a counselor through [facility], but they  are backed up, and she has not been able to meet a counselor yet.   SOCIAL HISTORY:  She lives with her two daughters.  She works at [name]  for approximately 4 months now.  She has a friend Marcelino Duster who keeps her  kids much of the time.  She also reports that her mother-in-law and her  pastor are part of her social support system.  Her parents live in [place].  She  denies any legal trouble at this time.  She is a high school graduate  with approximately 1 year of college education.   FAMILY HISTORY:  The patient is adopted, but she understands that her  biological parents were both alcoholics.   ALCOHOL AND DRUG HISTORY:  She drinks alcohol 2-3 times a month.  She smokes  cigarettes occasionally.   PRIMARY CARE Jamaurie Bernier:  Dr. Phineas Real at the [facility] in  [place].   MEDICAL PROBLEMS:  A history of gallbladder removal.  Partial hysterectomy.  She has had a reconstructed left foot.  She has a reconstructed right wrist.   MEDICATIONS:  1.  Depakote 1000 mg daily.  2.  Vistaril 25-50 mg three times a day as needed.  3.  Trazodone 50 mg at bedtime as needed.   ALLERGIES:  She is allergic to PENICILLIN and STRAWBERRIES.   PHYSICAL EXAMINATION:  Performed at [facility] Emergency Department after  transfer from [facility] Emergency Department.  Here at the [facility], her most recent vital signs were temperature 97.4, pulse 69,  respirations 20, blood pressure 115/76.   Her laboratory data, her CBC was  within normal limits.  Her blood chemistries were within normal limits.  Her  liver function tests were normal except for a low albumin of 3.2.  Her urine  pregnancy test is negative.  Her urine drug screen was negative.  Her  alcohol level was less than 5.  Her urine was positive for bilirubin,  ketones and protein.  She has pending laboratories of TSH, valproic acid  level and a repeat urinalysis.   MENTAL STATUS EXAM:  She was alert and oriented x 4.  She had fair eye  contact with a constricted affect.  Her appearance was disheveled.  Her  behavior was calm and cooperative.  Her speech was clear with even pace and  tone.  Her mood was mildly anxious.  Her thought process was coherent  without suicidal or homicidal ideation, no auditory or visual hallucinations  or mania observed.  Her cognitive function, her concentration was normal.  Her memory is intact.  Her insight is poor.   ADMISSION DIAGNOSES:  AXIS I:  Bipolar disorder, mixed episode.  AXIS II:  Deferred.  AXIS III:  History of gallbladder removal.  Partial hysterectomy.  Reconstructed left foot.  Reconstructed right wrist.  AXIS IV:  Moderate-to-severe for problems with her primary support group,  educational and occupational problems, economic problems.  AXIS V:  Current global assessment of function of 38 with past year range 55-  60.   PLAN:  Admit the patient voluntarily, work to stabilize her mood.  We will  initiate medication management after a period of time to make sure that her  Depakote level is not excessive.  We may need to start an antipsychotic  medication.  Her tentative length of stay 4-7 days.      Yolande Jolly, PA      Geoffery Lyons, M.D.  Electronically Signed    AHW/MEDQ  D:  03/18/2005  T:  03/18/2005  Job:  [id number]

## 2010-12-10 ENCOUNTER — Emergency Department: Payer: Self-pay | Admitting: Emergency Medicine

## 2011-01-02 ENCOUNTER — Inpatient Hospital Stay: Payer: Self-pay | Admitting: Psychiatry

## 2011-01-08 ENCOUNTER — Emergency Department: Payer: Self-pay | Admitting: Emergency Medicine

## 2011-02-09 ENCOUNTER — Emergency Department: Payer: Self-pay | Admitting: Emergency Medicine

## 2011-02-18 ENCOUNTER — Emergency Department: Payer: Self-pay | Admitting: Internal Medicine

## 2011-02-18 ENCOUNTER — Emergency Department: Payer: Self-pay | Admitting: Emergency Medicine

## 2011-04-10 ENCOUNTER — Emergency Department: Payer: Self-pay | Admitting: Emergency Medicine

## 2011-04-11 LAB — CBC
HCT: 33.5 — ABNORMAL LOW
Hemoglobin: 11.8 — ABNORMAL LOW
MCHC: 35.3
RBC: 3.77 — ABNORMAL LOW
RDW: 12.7

## 2011-04-11 LAB — DIFFERENTIAL
Basophils Absolute: 0.1
Eosinophils Relative: 4
Lymphocytes Relative: 34
Monocytes Absolute: 0.7
Monocytes Relative: 9
Neutro Abs: 4.2

## 2011-04-11 LAB — POCT I-STAT, CHEM 8
BUN: 7
Calcium, Ion: 1.13
Chloride: 102
Glucose, Bld: 94
TCO2: 26

## 2011-04-13 ENCOUNTER — Emergency Department: Payer: Self-pay | Admitting: Emergency Medicine

## 2011-05-09 ENCOUNTER — Emergency Department: Payer: Self-pay | Admitting: Emergency Medicine

## 2011-05-14 ENCOUNTER — Ambulatory Visit: Payer: Self-pay | Admitting: Internal Medicine

## 2011-06-06 ENCOUNTER — Emergency Department: Payer: Self-pay | Admitting: Emergency Medicine

## 2011-06-14 ENCOUNTER — Encounter: Payer: Self-pay | Admitting: *Deleted

## 2011-06-14 ENCOUNTER — Emergency Department (HOSPITAL_COMMUNITY)
Admission: EM | Admit: 2011-06-14 | Discharge: 2011-06-15 | Disposition: A | Discharge status: Other Acute Inpatient Hospital | Payer: Medicaid Other | Source: Home / Self Care | Attending: Emergency Medicine | Admitting: Emergency Medicine

## 2011-06-14 DIAGNOSIS — R45851 Suicidal ideations: Secondary | ICD-10-CM

## 2011-06-14 DIAGNOSIS — F17200 Nicotine dependence, unspecified, uncomplicated: Secondary | ICD-10-CM | POA: Insufficient documentation

## 2011-06-14 DIAGNOSIS — F3130 Bipolar disorder, current episode depressed, mild or moderate severity, unspecified: Secondary | ICD-10-CM

## 2011-06-14 DIAGNOSIS — E079 Disorder of thyroid, unspecified: Secondary | ICD-10-CM | POA: Insufficient documentation

## 2011-06-14 DIAGNOSIS — E669 Obesity, unspecified: Secondary | ICD-10-CM | POA: Insufficient documentation

## 2011-06-14 HISTORY — DX: Post-traumatic stress disorder, unspecified: F43.10

## 2011-06-14 HISTORY — DX: Anxiety disorder, unspecified: F41.9

## 2011-06-14 HISTORY — DX: Headache: R51

## 2011-06-14 HISTORY — DX: Depression, unspecified: F32.A

## 2011-06-14 HISTORY — DX: Disorder of thyroid, unspecified: E07.9

## 2011-06-14 HISTORY — DX: Bipolar disorder, unspecified: F31.9

## 2011-06-14 HISTORY — DX: Major depressive disorder, single episode, unspecified: F32.9

## 2011-06-14 LAB — RAPID URINE DRUG SCREEN, HOSP PERFORMED
Amphetamines: NOT DETECTED
Barbiturates: NOT DETECTED
Opiates: NOT DETECTED
Tetrahydrocannabinol: NOT DETECTED

## 2011-06-14 NOTE — ED Notes (Signed)
Pt has history of bipolar, has been taking additional meds to cope, a lot of family stressors recently, friend with pt states she told her counselor she is having suicidal thoughts, sent a text stating "sorry for everything ahead of time, hope this makes sense"

## 2011-06-15 ENCOUNTER — Encounter (HOSPITAL_COMMUNITY): Payer: Self-pay | Admitting: *Deleted

## 2011-06-15 ENCOUNTER — Inpatient Hospital Stay (HOSPITAL_COMMUNITY)
Admission: RE | Admit: 2011-06-15 | Discharge: 2011-06-27 | DRG: 885 | Disposition: A | Discharge status: Home / Self Care | Payer: Medicaid Other | Source: Ambulatory Visit | Attending: Psychiatry | Admitting: Psychiatry

## 2011-06-15 DIAGNOSIS — G43909 Migraine, unspecified, not intractable, without status migrainosus: Secondary | ICD-10-CM

## 2011-06-15 DIAGNOSIS — T43591A Poisoning by other antipsychotics and neuroleptics, accidental (unintentional), initial encounter: Secondary | ICD-10-CM

## 2011-06-15 DIAGNOSIS — Z882 Allergy status to sulfonamides status: Secondary | ICD-10-CM

## 2011-06-15 DIAGNOSIS — F3130 Bipolar disorder, current episode depressed, mild or moderate severity, unspecified: Secondary | ICD-10-CM

## 2011-06-15 DIAGNOSIS — F603 Borderline personality disorder: Secondary | ICD-10-CM

## 2011-06-15 DIAGNOSIS — Z880 Allergy status to penicillin: Secondary | ICD-10-CM

## 2011-06-15 DIAGNOSIS — Z79899 Other long term (current) drug therapy: Secondary | ICD-10-CM

## 2011-06-15 DIAGNOSIS — F4310 Post-traumatic stress disorder, unspecified: Secondary | ICD-10-CM

## 2011-06-15 DIAGNOSIS — F411 Generalized anxiety disorder: Secondary | ICD-10-CM

## 2011-06-15 DIAGNOSIS — T424X4A Poisoning by benzodiazepines, undetermined, initial encounter: Secondary | ICD-10-CM

## 2011-06-15 DIAGNOSIS — T43502A Poisoning by unspecified antipsychotics and neuroleptics, intentional self-harm, initial encounter: Secondary | ICD-10-CM

## 2011-06-15 DIAGNOSIS — E039 Hypothyroidism, unspecified: Secondary | ICD-10-CM

## 2011-06-15 DIAGNOSIS — E669 Obesity, unspecified: Secondary | ICD-10-CM

## 2011-06-15 DIAGNOSIS — Z888 Allergy status to other drugs, medicaments and biological substances status: Secondary | ICD-10-CM

## 2011-06-15 LAB — DIFFERENTIAL
Eosinophils Absolute: 0.1 10*3/uL (ref 0.0–0.7)
Lymphocytes Relative: 20 % (ref 12–46)
Lymphs Abs: 2 10*3/uL (ref 0.7–4.0)
Monocytes Relative: 7 % (ref 3–12)
Neutro Abs: 7.1 10*3/uL (ref 1.7–7.7)
Neutrophils Relative %: 71 % (ref 43–77)

## 2011-06-15 LAB — BASIC METABOLIC PANEL
BUN: 11 mg/dL (ref 6–23)
Chloride: 102 mEq/L (ref 96–112)
GFR calc Af Amer: >90 mL/min (ref >90)
Potassium: 3.8 mEq/L (ref 3.5–5.1)

## 2011-06-15 LAB — CBC
Hemoglobin: 12.7 g/dL (ref 12.0–15.0)
MCH: 28.7 pg (ref 26.0–34.0)
RBC: 4.43 MIL/uL (ref 3.87–5.11)

## 2011-06-15 MED ORDER — BUPROPION HCL ER (SR) 100 MG PO TB12
200.0000 mg | ORAL_TABLET | Freq: Two times a day (BID) | ORAL | Status: DC
  Administered 2011-06-15 – 2011-06-27 (×24): 200 mg via ORAL
  Filled 2011-06-15 (×2): qty 2
  Filled 2011-06-15: qty 28
  Filled 2011-06-15 (×7): qty 2
  Filled 2011-06-15: qty 28
  Filled 2011-06-15 (×18): qty 2

## 2011-06-15 MED ORDER — GABAPENTIN 800 MG PO TABS
800.0000 mg | ORAL_TABLET | Freq: Three times a day (TID) | ORAL | Status: DC
  Administered 2011-06-16 – 2011-06-17 (×3): 800 mg via ORAL
  Filled 2011-06-15 (×10): qty 1

## 2011-06-15 MED ORDER — LITHIUM CARBONATE ER 300 MG PO TBCR
300.0000 mg | EXTENDED_RELEASE_TABLET | Freq: Two times a day (BID) | ORAL | Status: DC
  Administered 2011-06-15 – 2011-06-18 (×6): 300 mg via ORAL
  Filled 2011-06-15 (×11): qty 1

## 2011-06-15 MED ORDER — ACETAMINOPHEN 500 MG PO TABS
500.0000 mg | ORAL_TABLET | Freq: Four times a day (QID) | ORAL | Status: DC | PRN
  Administered 2011-06-18 – 2011-06-19 (×2): 500 mg via ORAL
  Administered 2011-06-22: 650 mg via ORAL
  Administered 2011-06-23 – 2011-06-27 (×2): 500 mg via ORAL
  Filled 2011-06-15 (×3): qty 1

## 2011-06-15 MED ORDER — LEVOTHYROXINE SODIUM 125 MCG PO TABS
125.0000 ug | ORAL_TABLET | Freq: Every day | ORAL | Status: DC
  Administered 2011-06-15 – 2011-06-16 (×2): 125 ug via ORAL
  Filled 2011-06-15 (×4): qty 1

## 2011-06-15 MED ORDER — GABAPENTIN 600 MG PO TABS
1200.0000 mg | ORAL_TABLET | Freq: Three times a day (TID) | ORAL | Status: DC
  Filled 2011-06-15 (×3): qty 2

## 2011-06-15 MED ORDER — GABAPENTIN 400 MG PO CAPS
1200.0000 mg | ORAL_CAPSULE | Freq: Three times a day (TID) | ORAL | Status: DC
  Administered 2011-06-15: 1200 mg via ORAL
  Filled 2011-06-15 (×3): qty 3

## 2011-06-15 MED ORDER — MAGNESIUM HYDROXIDE 400 MG/5ML PO SUSP
30.0000 mL | Freq: Every day | ORAL | Status: DC | PRN
  Administered 2011-06-18 – 2011-06-19 (×2): 30 mL via ORAL

## 2011-06-15 MED ORDER — TOPIRAMATE 100 MG PO TABS
200.0000 mg | ORAL_TABLET | Freq: Two times a day (BID) | ORAL | Status: DC
  Administered 2011-06-15 – 2011-06-27 (×24): 200 mg via ORAL
  Filled 2011-06-15 (×11): qty 2
  Filled 2011-06-15: qty 28
  Filled 2011-06-15 (×10): qty 2
  Filled 2011-06-15: qty 28
  Filled 2011-06-15 (×4): qty 2

## 2011-06-15 MED ORDER — BUPROPION HCL ER (SR) 100 MG PO TB12
200.0000 mg | ORAL_TABLET | Freq: Two times a day (BID) | ORAL | Status: DC
  Administered 2011-06-15 (×2): 200 mg via ORAL
  Filled 2011-06-15 (×3): qty 2

## 2011-06-15 MED ORDER — CLONAZEPAM 0.5 MG PO TABS
0.5000 mg | ORAL_TABLET | Freq: Two times a day (BID) | ORAL | Status: DC | PRN
  Administered 2011-06-18: 0.5 mg via ORAL
  Filled 2011-06-15: qty 1

## 2011-06-15 MED ORDER — TOPIRAMATE 25 MG PO TABS
200.0000 mg | ORAL_TABLET | Freq: Two times a day (BID) | ORAL | Status: DC
  Administered 2011-06-15: 200 mg via ORAL
  Filled 2011-06-15: qty 8

## 2011-06-15 MED ORDER — HYDROXYZINE HCL 50 MG PO TABS
50.0000 mg | ORAL_TABLET | Freq: Every evening | ORAL | Status: DC | PRN
  Administered 2011-06-15 – 2011-06-27 (×10): 50 mg via ORAL
  Filled 2011-06-15 (×3): qty 1

## 2011-06-15 MED ORDER — LEVOTHYROXINE SODIUM 125 MCG PO TABS
125.0000 ug | ORAL_TABLET | Freq: Every day | ORAL | Status: DC
  Administered 2011-06-15: 125 ug via ORAL
  Filled 2011-06-15: qty 1

## 2011-06-15 MED ORDER — NICOTINE 21 MG/24HR TD PT24
21.0000 mg | MEDICATED_PATCH | Freq: Every day | TRANSDERMAL | Status: DC
  Administered 2011-06-15: 21 mg via TRANSDERMAL
  Filled 2011-06-15: qty 1

## 2011-06-15 MED ORDER — LITHIUM CARBONATE 300 MG PO CAPS: 600.0000 mg | ORAL_CAPSULE | Freq: Every day | ORAL | Status: DC

## 2011-06-15 MED ORDER — ALUM & MAG HYDROXIDE-SIMETH 200-200-20 MG/5ML PO SUSP: 30.0000 mL | ORAL | Status: DC | PRN

## 2011-06-15 MED ORDER — GABAPENTIN 800 MG PO TABS
800.0000 mg | ORAL_TABLET | Freq: Three times a day (TID) | ORAL | Status: DC
  Filled 2011-06-15 (×2): qty 1

## 2011-06-15 MED ORDER — ONDANSETRON HCL 4 MG PO TABS: 4.0000 mg | ORAL_TABLET | Freq: Three times a day (TID) | ORAL | Status: DC | PRN

## 2011-06-15 MED ORDER — LORAZEPAM 1 MG PO TABS: 1.0000 mg | ORAL_TABLET | Freq: Three times a day (TID) | ORAL | Status: DC | PRN

## 2011-06-15 MED ORDER — IBUPROFEN 600 MG PO TABS: 600.0000 mg | ORAL_TABLET | Freq: Three times a day (TID) | ORAL | Status: DC | PRN

## 2011-06-15 MED ORDER — ZOLPIDEM TARTRATE 5 MG PO TABS: 5.0000 mg | ORAL_TABLET | Freq: Every evening | ORAL | Status: DC | PRN

## 2011-06-15 MED ORDER — ACETAMINOPHEN 325 MG PO TABS: 650.0000 mg | ORAL_TABLET | ORAL | Status: DC | PRN

## 2011-06-15 MED ORDER — GABAPENTIN 400 MG PO CAPS
800.0000 mg | ORAL_CAPSULE | Freq: Once | ORAL | Status: AC
  Administered 2011-06-15: 800 mg via ORAL
  Filled 2011-06-15: qty 2
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
## 2011-06-15 NOTE — Discharge Planning (Signed)
Patient has been accepted to [facility] by Dr. Rogers Blocker to [address] bed 508-1.  Patient's support paperwork has been completed. EDP notified and is in agreement with disposition. EDP will discharge pt to [facility]. Pt nurse notified as well. ALL appropriate paperwork completed and forwarded to [facility] for review.   Ileene Hutchinson , MSW, LCSWA 06/15/2011 11:31 AM
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
## 2011-06-15 NOTE — ED Notes (Signed)
Alexis Christensen [phone]..the patient mother in law.

## 2011-06-15 NOTE — Consult Note (Signed)
Patient Identification:  Alexis Christensen Date of Evaluation:  06/15/2011   History of Present Illness: I saw the patient and reviewed the psych assessment done by the social worker. 40 year old Caucasian female with a history of a history of bipolar disorder followed by a psychiatrist regularly in the outpatient setting at [facility]. Patient reported depressed mood and having suicidal ideations for the last few days because of the number of stressors one of the main stressor is her daughter is admitted at behavioral health because she overdosed. Patient also reported decreased sleep unable to keep the food down and she is losing weight she lost almost 100 pounds in the last 6 months. She wrote a text message to the mother-in-law saying that that she don't want to live anymore. She denied any specific plan. She is not having any flight of ideas but she reported racing thoughts. She is logical and goal-directed she denies hearing  voices is not delusional.She Discussed with her psychiatrist and as per her recommendations she came to the ER to be admitted and wanted her medications to be changed. I discussed with her number of options she don't want to be on Abilify as she developed hives on it . Seroquel increased her blood sugar. Currently she is on Wellbutrin, lithium, Klonopin and Topamax. I will continue these medications and admit her in the inpatient setting for further stabilization.    Past Medical History:     Past Medical History  Diagnosis Date  . Bipolar 1 disorder   . Thyroid disease   . Post traumatic stress disorder (PTSD)   . Anxiety   . Depression   . Headache        Past Surgical History  Procedure Date  . Wrist surgery   . Ankle surgery   . Knee arthroscopy   . Tubal ligation   . Cholecystectomy   . Abdominal hysterectomy     Allergies:  Allergies  Allergen Reactions  . Geodon (Ziprasidone Hydrochloride)     Numbness ,sob, headaches,   . Sulfa Antibiotics   .  Abilify Rash  . Penicillins Rash    Current Medications:  Prior to Admission medications   Medication Sig Start Date End Date Taking? Authorizing Provider  buPROPion (WELLBUTRIN SR) 200 MG 12 hr tablet Take 200 mg by mouth 2 (two) times daily.     Yes Historical Provider, MD  clonazePAM (KLONOPIN) 1 MG tablet Take 1 mg by mouth at bedtime as needed. Also TID for patients anxiety    Yes Historical Provider, MD  gabapentin (NEURONTIN) 800 MG tablet Take 1,200 mg by mouth 3 (three) times daily.     Yes Historical Provider, MD  hydrOXYzine (ATARAX/VISTARIL) 50 MG tablet Take 50 mg by mouth every 6 (six) hours as needed. Anxiety    Yes Historical Provider, MD  levothyroxine (SYNTHROID, LEVOTHROID) 125 MCG tablet Take 125 mcg by mouth daily.     Yes Historical Provider, MD  lithium 600 MG capsule Take 600 mg by mouth at bedtime.     Yes Historical Provider, MD  topiramate (TOPAMAX) 100 MG tablet Take 200 mg by mouth 2 (two) times daily.     Yes Historical Provider, MD    Social History:    reports that she has been smoking.  She does not have any smokeless tobacco history on file. She reports that she drinks alcohol. Her drug history not on file.   Family History:    No family history on file.  Mental Status Examination/Evaluation:  Objective:  Appearance: Fairly Groomed  Psychomotor Activity:  Normal  Eye Contact::  Good  Speech:  Normal Rate  Volume:  Normal  Mood:  Depressed   Affect:  Congruent  Thought Process:  Logical and goal-directed   Orientation:  Full  Thought Content:  Not hallucinating or delusional   Suicidal Thoughts:  No  Homicidal Thoughts:  No  Judgement:  Intact  Insight:  Good    DIAGNOSIS:   AXIS I  bipolar disorder mixed type   AXIS II  Deffered  AXIS III See medical notes.  AXIS IV  psychosocial stressor   AXIS V 41-50 serious symptoms     Recommendations: We'll admit in the inpatient setting.    Eulogio Ditch, MD

## 2011-06-15 NOTE — Progress Notes (Signed)
Recreation Therapy Group Note  Date: 06/15/2011          Time: 1415      Group Topic/Focus: The focus of the group is on enhancing the patients' ability to cope with stressors by understanding what coping is, why it is important, the negative effects of stress and developing healthier coping skills. Group also practiced progressive muscle relaxation, guided imagery, and meditation.   Participation Level: Minimal  Participation Quality: Attentive  Affect: Depressed  Cognitive: Oriented   Additional Comments: Patient reported she was unsure if she could be safe, sat with her head in her hands much of group. Patient left group early to meet with NP and did not return.   Tawnie Ehresman 06/15/2011 4:00 PM

## 2011-06-15 NOTE — ED Provider Notes (Signed)
Pt accepted for transfer to [facility].    Carleene Cooper III, MD 06/15/11 [phone]

## 2011-06-15 NOTE — ED Provider Notes (Signed)
History     CSN: [id number] Arrival date & time: 06/14/2011 10:38 PM   First MD Initiated Contact with Patient 06/15/11 0154      Chief Complaint  Patient presents with  . Psychiatric Evaluation    (Consider location/radiation/quality/duration/timing/severity/associated sxs/prior treatment) HPI 40 year old female presents to the emergency department with suicidal thoughts, worsening bipolar disease. She comes with a family friend who she has been staying with reports patient is been taking extra clonazepam and hydroxyzine to cope with her symptoms. Patient is told her therapist and her family friend that she is thinking of harming herself. Patient has history of suicide in the past. Patient reports her daughter overdosed on Monday and is currently in behavioral health for treatment, and since that time she has felt very overwhelmed. Patient has been admitted to behavioral health several times the past to regulate her medications Past Medical History  Diagnosis Date  . Bipolar 1 disorder   . Thyroid disease   . Post traumatic stress disorder (PTSD)   . Anxiety   . Depression   . Headache     Past Surgical History  Procedure Date  . Wrist surgery   . Ankle surgery   . Knee arthroscopy   . Tubal ligation   . Cholecystectomy   . Abdominal hysterectomy     No family history on file.  History  Substance Use Topics  . Smoking status: Current Some Day Smoker -- 1.0 packs/day  . Smokeless tobacco: Not on file  . Alcohol Use: Yes    OB History    Grav Para Term Preterm Abortions TAB SAB Ect Mult Living                  Review of Systems  All other systems reviewed and are negative.    Allergies  Geodon; Sulfa antibiotics; Abilify; and Penicillins  Home Medications   Current Outpatient Rx  Name Route Sig Dispense Refill  . BUPROPION HCL ER (SR) 200 MG PO TB12 Oral Take 200 mg by mouth 2 (two) times daily.      Marland Kitchen CLONAZEPAM 1 MG PO TABS Oral Take 1 mg by mouth at  bedtime as needed. Also TID for patients anxiety     . GABAPENTIN 800 MG PO TABS Oral Take 1,200 mg by mouth 3 (three) times daily.      Marland Kitchen HYDROXYZINE HCL 50 MG PO TABS Oral Take 50 mg by mouth every 6 (six) hours as needed. Anxiety     . LEVOTHYROXINE SODIUM 125 MCG PO TABS Oral Take 125 mcg by mouth daily.      Marland Kitchen LITHIUM CARBONATE 600 MG PO CAPS Oral Take 600 mg by mouth at bedtime.      . TOPIRAMATE 100 MG PO TABS Oral Take 200 mg by mouth 2 (two) times daily.        BP 103/64  Pulse 64  Temp(Src) 98.6 F (37 C) (Oral)  Resp 18  SpO2 98%  Physical Exam  Nursing note and vitals reviewed. Constitutional: She is oriented to person, place, and time. She appears well-developed and well-nourished.       Obese  HENT:  Head: Normocephalic and atraumatic.  Nose: Nose normal.  Mouth/Throat: Oropharynx is clear and moist.  Eyes: Conjunctivae and EOM are normal. Pupils are equal, round, and reactive to light.  Neck: Normal range of motion. Neck supple. No JVD present. No tracheal deviation present. No thyromegaly present.  Cardiovascular: Normal rate, regular rhythm, normal heart sounds and intact  distal pulses.  Exam reveals no gallop and no friction rub.   No murmur heard. Pulmonary/Chest: Effort normal and breath sounds normal. No stridor. No respiratory distress. She has no wheezes. She has no rales. She exhibits no tenderness.  Abdominal: Soft. Bowel sounds are normal. She exhibits no distension and no mass. There is no tenderness. There is no rebound and no guarding.  Musculoskeletal: Normal range of motion. She exhibits no edema and no tenderness.  Lymphadenopathy:    She has no cervical adenopathy.  Neurological: She is oriented to person, place, and time. She has normal reflexes. No cranial nerve deficit. She exhibits normal muscle tone. Coordination normal.  Skin: Skin is dry. No rash noted. No erythema. No pallor.  Psychiatric: Her behavior is normal. Thought content normal.        Flat affect, poor insight    ED Course  Procedures (including critical care time)  Labs Reviewed  BASIC METABOLIC PANEL - Abnormal; Notable for the following:    Glucose, Bld 103 (*)    All other components within normal limits  URINE RAPID DRUG SCREEN (HOSP PERFORMED)  ETHANOL  CBC  DIFFERENTIAL  TSH   No results found.   No diagnosis found.    MDM  40 year old female with bipolar disease, currently not taking her medications appropriately and with suicidal ideation. Patient to be placed in psychiatric hold awaiting psychiatric evaluation and act team evaluation.        Olivia Mackie, MD 06/15/11 0830
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
## 2011-06-16 DIAGNOSIS — F319 Bipolar disorder, unspecified: Secondary | ICD-10-CM

## 2011-06-16 LAB — URINE MICROSCOPIC-ADD ON

## 2011-06-16 LAB — URINALYSIS, ROUTINE W REFLEX MICROSCOPIC
Bilirubin Urine: NEGATIVE
Glucose, UA: NEGATIVE mg/dL
Leukocytes, UA: NEGATIVE
Nitrite: NEGATIVE
Specific Gravity, Urine: 1.021 (ref 1.005–1.030)
pH: 8 (ref 5.0–8.0)

## 2011-06-16 MED ORDER — LEVOTHYROXINE SODIUM 150 MCG PO TABS
150.0000 ug | ORAL_TABLET | Freq: Every day | ORAL | Status: DC
  Administered 2011-06-17 – 2011-06-27 (×11): 150 ug via ORAL
  Filled 2011-06-16 (×13): qty 1

## 2011-06-16 NOTE — Progress Notes (Signed)
Pt. attended and participated in aftercare planning group. Suicide prevention information was given as well as suicide risk warning sign information and crisis line numbers to use. Pt. Stated that she has been having family issues with her children and dealing with depression and SI.

## 2011-06-16 NOTE — Progress Notes (Signed)
Patient ID: Alexis Christensen, female   DOB: 1970/10/12, 40 y.o.   MRN: [id number]  Patient is a new admission this evening. Reported to nurse this evening that she was having passive suicidal thoughts, general, with no plan. Patient was able to contract for safety. Stated 'I know I won't hurt myself. Especially for my children." Patient sad/upset about being away from children. Reinstated fact to patient that staff will continue to monitor patient every on the unit. Patient reassured of safety.

## 2011-06-16 NOTE — Progress Notes (Signed)
[facility] Adult Inpatient Family/Significant Other Suicide Prevention Education  Suicide Prevention Education:  Contact Attempts:Elizabeth [phone]- Inlaw))has been identified by the patient as the family member/significant other with whom the patient will be residing, and identified as the person(s) who will aid the patient in the event of a mental health crisis.  With written consent from the patient, two attempts were made to provide suicide prevention education, prior to and/or following the patient's discharge.  We were unsuccessful in providing suicide prevention education.  A suicide education pamphlet was given to the patient to share with family/significant other.  Date and time of first attempt: on 06/16/11 at 1531 p.m by Lamar Blinks Date and time of second attempt:  Neila Gear 06/16/2011, 3:32 PM

## 2011-06-16 NOTE — Progress Notes (Signed)
Suicide Risk Assessment  Admission Assessment     Demographic factors:  Assessment Details Time of Assessment: Admission Information Obtained From: Patient Current Mental Status:  Current Mental Status: Suicidal ideation indicated by patient Loss Factors:  Loss Factors: Loss of significant relationship Historical Factors:  Historical Factors: Family history of mental illness or substance abuse;Impulsivity Risk Reduction Factors:  Risk Reduction Factors: Responsible for children under 9 years of age;Sense of responsibility to family;Religious beliefs about death;Employed;Positive social support  CLINICAL FACTORS:   Bipolar Disorder:   Mixed State  COGNITIVE FEATURES THAT CONTRIBUTE TO RISK:  Thought constriction (tunnel vision)    SUICIDE RISK:   Moderate:  Frequent suicidal ideation with limited intensity, and duration, some specificity in terms of plans, no associated intent, good self-control, limited dysphoria/symptomatology, some risk factors present, and identifiable protective factors, including available and accessible social support.  PLAN OF CARE: I reviewed the medical records along with the assessment done by the nurse practitioner. Patient told me that she is feeling weak she still depressed and have suicidal ideations without a specific plan. She is logical and goal directed not hallucinating or delusional. I will continue with current medication regimen and I told the patient to attend all the groups. As she has developed side effects in the past from Abilify causing hives and Seroquel increased her blood sugar level I will not add any antipsychotic at this time. She is on good doses of all the medications Wellbutrin Neurontin lithium and Topamax. Estimate length of stay in the hospital will be 3-4 days. Social worker and counselor should get more collateral information on patient.   AHLUWALIA,SHAMSHER S 06/16/2011, 7:51 AM

## 2011-06-16 NOTE — Progress Notes (Signed)
Patient ID: NARGIS ABRAMS, female   DOB: 05-26-1971, 40 y.o.   MRN: [id number]  [facility] Group Notes:  (Counselor/Nursing/MHT/Case Management/Adjunct)  06/16/2011 1:15 PM  Type of Therapy:  Group Therapy  Participation Level:  Active  Participation Quality:  Appropriate  Affect:  Appropriate  Cognitive:  Appropriate  Insight:  Limited  Engagement in Group:  Good  Engagement in Therapy:  Good  Modes of Intervention:  Clarification, Problem-solving, Role-play, Socialization and Support  Summary of Progress/Problems:   Group identified what the signs are of self-sabotaging behaviors. Group members stated at least one positive behavior to change the identified sabotaging behavior. Pt shared that she was "not feeing well" and that she realizes that inflicting pain on herself is a form of self-sabotage. She appropriately engaged in group discussion by sharing her personal experiences. She stated that she is going to reach out to her counselor or family members instead of harming herself.   Thomasena Edis, [employer]

## 2011-06-16 NOTE — Progress Notes (Signed)
[facility] Adult Inpatient Family/Significant Other Suicide Prevention Education  Suicide Prevention Education:  Education Completed; Alexis Christensen [phone] has been identified by the patient as the family member/significant other with whom the patient will be residing, and identified as the person(s) who will aid the patient in the event of a mental health crisis (suicidal ideations/suicide attempt).  With written consent from the patient, the family member/significant other has been provided the following suicide prevention education, prior to the and/or following the discharge of the patient.  The suicide prevention education provided includes the following:  Suicide risk factors  Suicide prevention and interventions  National Suicide Hotline telephone number  [facility] assessment telephone number  [facility] Emergency Assistance 911  [facility] and/or Residential Mobile Crisis Unit telephone number  Request made of family/significant other to:  Remove weapons (e.g., guns, rifles, knives), all items previously/currently identified as safety concern.    Remove drugs/medications (over-the-counter, prescriptions, illicit drugs), all items previously/currently identified as a safety concern.Pt.'s  Family will secure pt.'s medication . Alexis Christensen lives in [place], pt.'s ex husband lives in [place], and pt.'s  Mother and father live in [place]. Pt. Lives in [place] with her 2 daughters. Pt.'s 42 year old daughter is at [facility] on the child/adolescent side of hospital for a overdose. Pt.'s in laws are the pt. Main support.  Pt.'s family had concerns about the amount of medication prescribed by the pt.'s psychiatrist and how often  the pt. takes her medication. Pt.'s family can be reached at the number above.  The family member/significant other verbalizes understanding of the suicide prevention education information provided.  The family member/significant other agrees to  remove the items of safety concern listed above.  Alexis Christensen 06/16/2011, 4:39 PM

## 2011-06-16 NOTE — Progress Notes (Signed)
  Alexis Christensen is a 40 y.o. female [id number] [birth year]  06/15/2011 Active Problems:  * No active hospital problems. *   Subjective/Objective Overwhlemed by her children's situations. 10yo had court this past week and 40 yo is admitted on the adolescent unit after an overdose.  Patient's TSH is elevated at 11.7 she is prescribed Synthroid 125 mcg.  Mental Status: Not actively suicidal but still feels hopeless.    Filed Vitals:   06/16/11 0656  BP: 95/61  Pulse: 94  Temp:   Resp:     Lab Results:   BMET    Component Value Date/Time   NA 137 06/14/2011 2350   K 3.8 06/14/2011 2350   CL 102 06/14/2011 2350   CO2 27 06/14/2011 2350   GLUCOSE 103* 06/14/2011 2350   BUN 11 06/14/2011 2350   CREATININE 0.70 06/14/2011 2350   CALCIUM 9.5 06/14/2011 2350   GFRNONAA >90 06/14/2011 2350   GFRAA >90 06/14/2011 2350    Medications:  Scheduled:     . buPROPion  200 mg Oral BID  . gabapentin  800 mg Oral Once  . gabapentin  800 mg Oral TID  . levothyroxine  125 mcg Oral Daily  . lithium carbonate  300 mg Oral Q12H  . topiramate  200 mg Oral BID  . DISCONTD: gabapentin  800 mg Oral TID   PRN Meds acetaminophen, alum & mag hydroxide-simeth, clonazePAM, hydrOXYzine, magnesium hydroxide  Plan: increase the Synthroid  06/16/2011

## 2011-06-17 LAB — URINE DRUGS OF ABUSE SCREEN W ALC, ROUTINE (REF LAB)
Barbiturate Quant, Ur: NEGATIVE
Creatinine,U: 196.1 mg/dL
Ethyl Alcohol: <10 mg/dL (ref <10)
Marijuana Metabolite: NEGATIVE
Methadone: NEGATIVE

## 2011-06-17 MED ORDER — GABAPENTIN 400 MG PO CAPS
800.0000 mg | ORAL_CAPSULE | Freq: Three times a day (TID) | ORAL | Status: DC
  Administered 2011-06-17 – 2011-06-27 (×31): 800 mg via ORAL
  Filled 2011-06-17 (×33): qty 2

## 2011-06-17 NOTE — Progress Notes (Signed)
Patient ID: Alexis Christensen, female   DOB: April 12, 1971, 40 y.o.   MRN: [id number] Pt. Denies lethality and A/V/H's. Pt. Is appropriate with staff and peers and is med compliant. No c/o pain tonight: Pt. attended group and has been interacting with peers.

## 2011-06-17 NOTE — Progress Notes (Signed)
[facility] Group Notes:  (Counselor/Nursing/MHT/Case Management/Adjunct)  06/17/2011 1315PM  Type of Therapy:  Psychoeducational Skills  Participation Level:  Active  Participation Quality:  Appropriate  Affect:  Appropriate  Cognitive:  Appropriate  Insight:  Good  Engagement in Group:  Good  Engagement in Therapy:  Good  Modes of Intervention:  Clarification, Problem-solving and Support  Summary of Progress/Problems:Pt. Stated that her support comes from her ex-mother-in-law and ex-father-in-law. Pt. Shared in group that her daughter is on the C/A unit and it is difficult for her to have lunch in the cafeteria because that is the last place she saw her daughter before she was admitted to [facility].    Alexis Christensen 06/17/2011, 3:04 PM

## 2011-06-17 NOTE — Progress Notes (Signed)
Pt. participated in group session on supports. Pt. was asked what support means to them, who are their supports, what is the difference between unhealthy and healthy supports and what they can do when their support is not there. Pt. Stated that her support comes from her ex-mother-in-law and ex-father-in-law. Pt. Shared in group that her daughter is on the C/A unit and it is difficult for her to have lunch in the cafeteria because that is the last place she saw her daughter before she was admitted to [facility].

## 2011-06-17 NOTE — Progress Notes (Signed)
  Alexis Christensen is a 40 y.o. female [id number] 1971/06/15  06/15/2011 Active Problems:  * No active hospital problems. *    Mental Status:Still has SI but is working on it. Was lightheaded and fell several times yesterday-hence is using a wheelchair today. Has slept better and is eating some more. Worried about her blood sugar -instructed her to ask for her blood sugar to be checked if she felt dizzy.  Daughter is doing well on adolescent unit.  Filed Vitals:   06/17/11 0722  BP: 99/65  Pulse: 87  Temp:   Resp:     Lab Results:   BMET    Component Value Date/Time   NA 137 06/14/2011 2350   K 3.8 06/14/2011 2350   CL 102 06/14/2011 2350   CO2 27 06/14/2011 2350   GLUCOSE 103* 06/14/2011 2350   BUN 11 06/14/2011 2350   CREATININE 0.70 06/14/2011 2350   CALCIUM 9.5 06/14/2011 2350   GFRNONAA >90 06/14/2011 2350   GFRAA >90 06/14/2011 2350    Medications:  Scheduled:     . buPROPion  200 mg Oral BID  . gabapentin  800 mg Oral TID AC  . levothyroxine  150 mcg Oral QAC breakfast  . lithium carbonate  300 mg Oral Q12H  . topiramate  200 mg Oral BID  . DISCONTD: gabapentin  800 mg Oral TID     PRN Meds acetaminophen, alum & mag hydroxide-simeth, clonazePAM, hydrOXYzine, magnesium hydroxide  Plan: no change  to current plan of care.  Ria Redcay,MICKIE D. 06/17/2011

## 2011-06-17 NOTE — Progress Notes (Signed)
Pt. attended and participated in aftercare planning group. [facility] support group information was given. Pt. Stated that she continues to suffer from anxiety and SI.

## 2011-06-18 LAB — URINALYSIS, ROUTINE W REFLEX MICROSCOPIC
Bilirubin Urine: NEGATIVE
Glucose, UA: NEGATIVE mg/dL
Hgb urine dipstick: NEGATIVE
Ketones, ur: NEGATIVE mg/dL
Protein, ur: NEGATIVE mg/dL

## 2011-06-18 LAB — LITHIUM LEVEL: Lithium Lvl: 0.35 mEq/L — ABNORMAL LOW (ref 0.80–1.40)

## 2011-06-18 MED ORDER — PRAZOSIN HCL 1 MG PO CAPS
1.0000 mg | ORAL_CAPSULE | Freq: Every day | ORAL | Status: DC
  Administered 2011-06-18 – 2011-06-19 (×2): 1 mg via ORAL
  Filled 2011-06-18 (×4): qty 1

## 2011-06-18 MED ORDER — LITHIUM CARBONATE ER 300 MG PO TBCR
600.0000 mg | EXTENDED_RELEASE_TABLET | Freq: Two times a day (BID) | ORAL | Status: DC
  Administered 2011-06-18 – 2011-06-27 (×18): 600 mg via ORAL
  Filled 2011-06-18 (×4): qty 2
  Filled 2011-06-18: qty 28
  Filled 2011-06-18 (×4): qty 2
  Filled 2011-06-18: qty 28
  Filled 2011-06-18 (×11): qty 2

## 2011-06-18 NOTE — Progress Notes (Signed)
Patient seen during d/c planning group.  She endorsed SI but contracted for safety.  Patient rated depression at eight, anxiety, hopelessness and helplessness all at nine.  Patient advised that  Her daughter is on the child/adolescent unit following a suicide attempt.  Patient advised of blaming herself for daughter being inpatient.  She denies any ETOH/durg use and states she is followed outpatient by [facility] in  [place].  Suicide prevention education reviewed.

## 2011-06-18 NOTE — Progress Notes (Signed)
Patient ID: Alexis Christensen, female   DOB: 02/06/1971, 40 y.o.   MRN: [id number] Nursing: Pt. Denies lethality other than passive thoughts of self-harm without a plan. Pt. denies A/V/H's. Pt.'s affect and mood are congruent and tend to exhibit depression.  When asked, Pt. endorsed guilt and sadness about her daughter's hospitalization, which she was told by a relative who's spoken with her daughter was caused by her daughter's guilty feelings and sadness about her mother.  Pt. attended group and participated. Pt. Took her HS meds and went to bed early.

## 2011-06-18 NOTE — Progress Notes (Signed)
Patient denied SI and HI while talking to nurse, contracts for safety.  Denied A/V hallucinations.   On self inventory form patient stated she sleeps fair, has poor appetite, low energy level, improving attention span.  Rated depression and hopelessness #2.  Denied withdrawals.  SI off/on, contracted for safety while talking to nurse.  Has had lightheadedness in past 24 hours, felt dizzy, headache, pain when urinating, small amount of blood in urine and spit up very small amount of blood this morning.  Will discuss with doctor this morning.  Not sure of discharge plans and not sure about her medications after discharge.

## 2011-06-18 NOTE — Progress Notes (Signed)
[facility] MD Progress Note  06/18/2011 6:14 PM  Diagnosis:  Axis I: Bipolar, Depressed  ADL's:  Intact  Sleep:  Says that she has nightmares whenever she sleeps.  She requests meds to help suppress nightmares.  Her BP is on the low side, so will monitor to see if she can tolerate Minipress.  May have to consider Clonidine if action of Minipress is too long.  Appetite:  Reports no appetite what so ever.  Suicidal Ideation:   Plan:  No  Intent:  Yes  Means:  No  Homicidal Ideation:   Plan:  No  Intent:  No  Means:  No  AEB (as evidenced by):  Mental Status: General Appearance Alexis Christensen:  Casual Eye Contact:  Good Motor Behavior:  Restlestness Speech:  Normal Level of Consciousness:  Alert Mood:  Anxious and Hopeless Affect:  Blunt Anxiety Level:  Moderate Thought Process:  Coherent and Relevant Thought Content:  Rumination Perception:  Normal Judgment:  Fair Insight:  Absent Cognition:  Concentration Yes Sleep:  Number of Hours: 6.5   Vital Signs:Blood pressure 97/73, pulse 99, temperature 97.4 F (36.3 C), temperature source Oral, resp. rate 18.  Lab Results:  Results for orders placed during the hospital encounter of 06/15/11 (from the past 48 hour(s))  URINALYSIS, ROUTINE W REFLEX MICROSCOPIC     Status: Abnormal   Collection Time   06/16/11  7:00 PM      Component Value Range Comment   Color, Urine YELLOW  YELLOW     APPearance CLEAR  CLEAR     Specific Gravity, Urine 1.021  1.005 - 1.030     pH 8.0  5.0 - 8.0     Glucose, UA NEGATIVE  NEGATIVE (mg/dL)    Hgb urine dipstick NEGATIVE  NEGATIVE     Bilirubin Urine NEGATIVE  NEGATIVE     Ketones, ur NEGATIVE  NEGATIVE (mg/dL)    Protein, ur 30 (*) NEGATIVE (mg/dL)    Urobilinogen, UA 1.0  0.0 - 1.0 (mg/dL)    Nitrite NEGATIVE  NEGATIVE     Leukocytes, UA NEGATIVE  NEGATIVE    DRUGS OF ABUSE SCREEN W ALC, ROUTINE URINE     Status: Normal   Collection Time   06/16/11  7:00 PM      Component Value Range  Comment   Amphetamine Screen, Ur NEGATIVE  Negative     Marijuana Metabolite NEGATIVE  Negative     Barbiturate Quant, Ur NEGATIVE  Negative     Methadone NEGATIVE  Negative     Propoxyphene NEGATIVE  Negative     Benzodiazepines. NEGATIVE  Negative     Phencyclidine (PCP) NEGATIVE  Negative     Cocaine Metabolites NEGATIVE  Negative     Opiate Screen, Urine NEGATIVE  Negative     Ethyl Alcohol <10  <10 (mg/dL)    Creatinine,U 161.0     URINE MICROSCOPIC-ADD ON     Status: Abnormal   Collection Time   06/16/11  7:00 PM      Component Value Range Comment   Squamous Epithelial / LPF RARE  RARE     WBC, UA 0-2  <3 (WBC/hpf)    Bacteria, UA MANY (*) RARE    GLUCOSE, CAPILLARY     Status: Abnormal   Collection Time   06/17/11  9:22 PM      Component Value Range Comment   Glucose-Capillary 108 (*) 70 - 99 (mg/dL)     Physical Findings: AIMS:  , ,  ,  ,  CIWA:    COWS:     Treatment Plan Summary: Daily contact with patient to assess and evaluate symptoms and progress in treatment Medication management Increase Lithium back to prior dose of 600mg  for assistance with depression   Shalandra Leu 06/18/2011, 6:14 PM

## 2011-06-18 NOTE — Progress Notes (Signed)
[facility] Group Notes:  (Counselor/Nursing/MHT/Case Management/Adjunct)  06/18/2011 3:26 PM  Type of Therapy:  Group Therapy  Participation Level:  Minimal  Participation Quality:  Appropriate and Attentive and Sharing and Supportive  Affect:  Blunted  Cognitive:  Alert and Appropriate  Insight:  Limited  Engagement in Group:  Good  Engagement in Therapy:  Limited  Modes of Intervention:  Support and Exploration  Summary of Progress/Problems: Alexis Christensen was only present for part of group. She shared about her experience with needing to take small steps into recovery, and indicated that she has to accept that things are not as she wants them to be in order to move further toward wellness. Alexis Christensen was appropriate in her conversations and was supportive of other group members sharing.   [name] 06/18/2011, 3:26 PM

## 2011-06-18 NOTE — Tx Team (Signed)
Interdisciplinary Treatment Plan Update (Adult)  Date:  06/27/2011   Time Reviewed:  11:13 AM    Progress in Treatment: Attending groups:   Yes   Participating in groups:  Yes Taking medication as prescribed:  Yes Tolerating medication:  Yes Family/Significant othe contact made:  Patient understands diagnosis:  Yes Discussing patient identified problems/goals with staff: Yes Medical problems stabilized or resolved: Yes Denies suicidal/homicidal ideation:Yes Issues/concerns per patient self-inventory:  Other:  New problem(s) identified: N/A  Reason for Continuation of Hospitalization: Stable to d/c  Interventions implemented related to continuation of hospitalization: Stable to d/c  Additional comments: N/A  Estimated length of stay: D/C today  Discharge Plan: Pt will follow up at [facility] in [place] Dr. Christia Reading for therapy and Dr. Katrinka Blazing for medication management.    New goal(s):  N/A  Review of initial/current patient goals per problem list:    1.  Goal(s):  Eliminate SI  Met: Yes   Target date: by d/c  As evidenced by:  Pt denies SI today.  2.  Goal (s): .Reduce depression   Met:  Yes  Target date: by d/c  As evidenced by:  Reduce depression from a 10 to a 3.  Pt ranks at a 1 today.    3.  Goal(s): Reduce Anxiety  Met:  Yes  Target date: by d/c  As evidenced by: Reduce anxiety from a 10 to a 3.  Pt ranks at a 1 today.     Attendees: Patient:  Alexis Christensen 06/27/2011 11:14 AM   Family:     Physician:  Orson Aloe, MD 06/27/2011 11:17 AM   Nursing:    Quintella Reichert, RN 06/27/2011 11:17 AM   CaseManager:  Juline Patch, LCSW 06/27/2011 11:17 AM   Counselor:  Angus Palms, LCSW 06/27/2011 11:17 AM   Other:  Cato Mulligan, RN 06/27/2011 11:17 AM   Other:  Reyes Ivan, LCSWA 06/27/2011 11:17 AM   Other:  Armandina Stammer, NP 06/27/2011 11:17 AM   Other:      Scribe for Treatment Team:   Reyes Ivan, LCSWA 06/27/2011  11:17 AM

## 2011-06-18 NOTE — Progress Notes (Signed)
[facility] Group Notes:  (Counselor/Nursing/MHT/Case Management/Adjunct)  06/18/2011 3:57 PM  Type of Therapy:  Group Therapy  Participation Level:  Active  Participation Quality:  Appropriate, Attentive and Sharing  Affect:  Blunted  Cognitive:  Alert and Appropriate  Insight:  Good  Engagement in Group:  Good  Engagement in Therapy:  Good  Modes of Intervention:  Support and Exploration  Summary of Progress/Problems: Alexis Christensen was engaged and discussed her concept of mindfulness. She was very focused on different types of meditation, describing how she finds meditation to music relaxing in taking her to a different place. Alexis Christensen was able to explore ways she could use mindfulness to tune in more to what is going on with her oldest daughter. She also acknowledged the need to take better care of herself so that she can be there for her daughters when they need her. Alexis Christensen processed the feelings between herself and her children, pointing out that she has to accept that things are the way they are before she can work on making them better.    [name] 06/18/2011, 3:57 PM

## 2011-06-19 MED ORDER — CIPROFLOXACIN HCL 250 MG PO TABS
250.0000 mg | ORAL_TABLET | Freq: Two times a day (BID) | ORAL | Status: AC
  Administered 2011-06-19 – 2011-06-25 (×14): 250 mg via ORAL
  Filled 2011-06-19 (×15): qty 1

## 2011-06-19 MED ORDER — SUMATRIPTAN SUCCINATE 6 MG/0.5ML ~~LOC~~ SOLN
6.0000 mg | Freq: Once | SUBCUTANEOUS | Status: AC
  Administered 2011-06-19: 6 mg via SUBCUTANEOUS
  Filled 2011-06-19: qty 0.5

## 2011-06-19 MED ORDER — CARBAMAZEPINE 200 MG PO TABS
200.0000 mg | ORAL_TABLET | Freq: Three times a day (TID) | ORAL | Status: DC
  Administered 2011-06-19 – 2011-06-22 (×10): 200 mg via ORAL
  Filled 2011-06-19 (×13): qty 1

## 2011-06-19 MED ORDER — CHLORPROMAZINE HCL 10 MG PO TABS
10.0000 mg | ORAL_TABLET | Freq: Three times a day (TID) | ORAL | Status: DC
  Filled 2011-06-19 (×2): qty 1

## 2011-06-19 MED ORDER — ENSURE PUDDING PO PUDG
1.0000 | Freq: Three times a day (TID) | ORAL | Status: DC | PRN
  Administered 2011-06-21 – 2011-06-25 (×3): 1 via ORAL
  Filled 2011-06-19: qty 1

## 2011-06-19 MED ORDER — CHLORPROMAZINE HCL 25 MG PO TABS
12.5000 mg | ORAL_TABLET | Freq: Three times a day (TID) | ORAL | Status: DC
  Administered 2011-06-19 – 2011-06-21 (×6): 12.5 mg via ORAL
  Filled 2011-06-19 (×10): qty 1

## 2011-06-19 NOTE — Progress Notes (Signed)
Group focus was on Losses and grief. Group engaged in conversation around key losses in their lives and how they are experiencing grief related to the losses relevant to them. Discussion also occurred regarding how their grief reactions were influenced in their childhood and what can help them now in finding their way through their grief. Group was supportive of one another and members found many points of common experiences. Pt, was tearful as she talked about her daughter's attempted suicide, feeling that she learned this way of reacting from her. She talked about her own family history and her desire to create a different experience for her daughter though she is struggling now. THere was a group conversation on the meaning of accepting the need for medication and support and being able to view those as ways to care for self.   Theda Belfast, M.Div., [facility] Director spiritual care services

## 2011-06-19 NOTE — Progress Notes (Signed)
Type of Therapy:  Group Therapy  Participation Level:  Active  Participation Quality:  Attentive, Appropriate, Sharing  Affect:  Tearful, Blunted  Cognitive:  Appropriate  Insight:  Limited  Engagement in Group: Good  Engagement in Therapy:  Limited  Modes of Intervention:  Support and Exploration  Summary of Progress/Problems: Nadalee identified that the day her daughter attempted suicide was her "d-day" which changed her life. She explored how she had planned to do the same thing and how her daughter's attempt impacted her. Lydie processed her feelings of guilt over her own suicide plan and wondered if her daughter's attempt was in relation to her own problems. She seemed to have trouble focusing on herself and went back several times to wishing she had a better relationship with older daughter.

## 2011-06-19 NOTE — Progress Notes (Signed)
Pt is on 1:1 for safety d/t suicidal thoughts and weakness.  She has been ambulating in a wheelchair since this writer came on shift, although pt was ambulating fine independently this morning according to report from earlier shift.  Pt is complaining of weakness and dizziness.  She reports feeling light-headed.  She has been put on some additional meds and informed pt that it was likely coming from the meds.  Pt seemed satisfied with this explanation.  She is cooperative with 1:1.  She voices no thoughts of self harm at this time.  Safety maintained with 1:1 observation.

## 2011-06-19 NOTE — Progress Notes (Signed)
Patient ID: Alexis Christensen, female   DOB: 01/06/1971, 40 y.o.   MRN: [id number] Patient has 1:1 today, safety maintained.   On self inventory sheet, patient sleeps fair, has poor appetite, low energy level, poor attention span.   Rated depression #2, hopelessness #2.  Admits to SI, cannot contract for safety.  Has experienced dizziness, headaches in past 24 hours.  Plans to do better with her meds after discharge.  Questions if klonipin could be cause of unsteadiness.  Unsure of discharge plans.  No problems staying on meds after discharge.   Patient has been cooperative and pleasant today.   Going to groups.  1:1 will continue for safety measures.

## 2011-06-19 NOTE — Progress Notes (Signed)
Patient ID: Alexis Christensen, female   DOB: [birth year], 40 y.o.   MRN: [id number] Pt slept pretty well last night.  She woke and asked for colors to draw with and actually colored for about 5 minutes, then fell back asleep.  She had fewer nightmares than the night before.  Mood is described as getting better.  It is not as deep as it was.    Pt asks for something that is not addictive for her anxiety.  She was told about Thorazine for anxiety and Tegretol for prevention of seizures.  She was happy to try something different.  She had an allergic reaction of a skin rash to Abilify. So, will try very low dose of Thorazine and observe closely for a reaction.  Will plan to build to a higher dose of Thorazine later.  Will plan to stop the Tegretol in 10 days.  She may find that the Tegretol helps the mood and therefore stay on it after that.   She was informed that the wheelchair is getting old. She has had some trouble with being wobbly when she was in [facility] this past June.  She never fell, but was just weak in the legs.  We will think about her graduating to an [employer] with wheels tomorrow.     She noted a benefit from the Imitrex shot.  She asks for another to be available to her if she has another headache come on tonight or whenever a doctor is not on the premise.  I encouraged her to do the diligent headache prevention thing tell the nurse at the firs sign of a mild headache and then go to her room and cover her head and limit the noise in the room.  I think I will not inform the patient but will order that her roommate be moved to another room if that happens and let her rest at the end of the hall and not be disturbed for several hours.  Pt asked my opinion of ECT.  I told her that I have seen it work quite well, but it causes memory loss.  I delved further into the nature of her depression.  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

## 2011-06-19 NOTE — Progress Notes (Signed)
Type of Therapy:  Group Therapy  Participation Level:  Minimal  Participation Quality:  Inattentive, Drowsy  Affect:  Blunted  Cognitive:  Appropriate  Insight:  Poor  Engagement in Group: Limited  Engagement in Therapy:  None  Modes of Intervention:  Support and Exploration  Summary of Progress/Problems: Alexis Christensen seemed drowsy throughout group and appeared to drift off into sleep at the very end portion. Prior to that she was minimally engaged, stating that she often uses humor to cover her sadness and that she is having a hard time facing that she is not there for her daughter to come home to today.   [name] 06/19/2011  3:55 PM

## 2011-06-20 MED ORDER — MAGNESIUM CITRATE PO SOLN
148.0000 mL | Freq: Once | ORAL | Status: AC
  Administered 2011-06-20: 148 mL via ORAL

## 2011-06-20 MED ORDER — PRAZOSIN HCL 2 MG PO CAPS
2.0000 mg | ORAL_CAPSULE | Freq: Every day | ORAL | Status: DC
  Administered 2011-06-20 – 2011-06-26 (×6): 2 mg via ORAL
  Filled 2011-06-20 (×5): qty 1
  Filled 2011-06-20: qty 2
  Filled 2011-06-20: qty 1
  Filled 2011-06-20: qty 2
  Filled 2011-06-20: qty 1
  Filled 2011-06-20: qty 14
  Filled 2011-06-20: qty 1

## 2011-06-20 NOTE — Progress Notes (Signed)
Patient ID: Alexis Christensen, female   DOB: 1971/02/23, 40 y.o.   MRN: [id number] Pt reported that she only slept 3 hours again last night.  Will push the Prazosin for this.   She has not noted any benefit to the Tegretol yet.  She is wanting to try and learn some different coping strategies.  She is talking and getting her issues written down.  She is using a journal for that.  She feels that the spiritual method we talked about yesterday may be helpful for her.  She was given a list of the Al-Anon meetings in her area.  Her mood is "moving up" not as bad as yesterday.  It is moving up.  There are some occasional thoughts of suicide.  No actual plans.

## 2011-06-20 NOTE — Progress Notes (Signed)
Patient ID: Alexis Christensen, female   DOB: 08-26-1970, 40 y.o.   MRN: [id number] Patient has 1:1 present for safety.   Stated she has thoughts of hurting self.  Clinical research associate for safety.   On self inventory sheet, patient sleeps fair, has poor appetite, low energy level, improving attention span.   Rated depression and hopelessness #3.  No withdrawals.  Positive for SI, cannot contract for safety.   Has experienced lightheadedne4ss, dizzy, headaches.  Pain goal #0.   Do better taking my medications so I can help my children better.  No questions for staff.  No discharge plans.  No problems staying on meds after discharge.

## 2011-06-20 NOTE — Progress Notes (Signed)
Recreation Therapy Group Note  Date: 06/20/2011         Time: 1415      Group Topic/Focus: The focus of the group is on enhancing the patients' ability to cope with stressors by understanding what coping is, why it is important, the negative effects of stress and developing healthier coping skills. Patients asked to complete a fifteen minute plan, outlining three triggers, three supports, and fifteen coping activities.  Participation Level: Minimal  Participation Quality: Resistant  Affect: Irritable  Cognitive: Oriented   Additional Comments: Patient pessimistic, dismissive of most things discussed by RT.  Alexis Christensen 06/20/2011 3:51 PM

## 2011-06-20 NOTE — Progress Notes (Signed)
[facility] Group Notes: (Counselor/Nursing/MHT/Case Management/Adjunct)   Type of Therapy:  Group Therapy  Participation Level:  Active  Participation Quality:  Appropriate, Attentive, and Sharing  Affect:  Depressed  Cognitive:  Appropriate  Insight:  Limited  Engagement in Group: Good   Engagement in Therapy: Limited  Modes of Intervention:  Support and Exploration  Summary of Progress/Problems: Alexis Christensen participated in the meditation exercise and found it very helpful. She stated that she loves to meditate, and that it calms her almost immediately, though she did add that the effects of meditation don't last long for her. When discussing her Safe Place, she talked about sneaking out the window of her best friend and eating candy on the roof during childhood. Alexis Christensen then went on to talk about how her mother did not like this friend and moved her away from the friend, as Alexis Christensen reports she has done from all her friends throughout life.   [name] 06/20/2011  3:30 PM

## 2011-06-20 NOTE — Progress Notes (Signed)
[facility] Group Notes: (Counselor/Nursing/MHT/Case Management/Adjunct)   Type of Therapy:  Group Therapy  Participation Level:  Minimal  Participation Quality:  Resistant  Affect:  Irritable  Cognitive:  Appropriate  Insight:  Poor  Engagement in Group: Minimal  Engagement in Therapy:  None  Modes of Intervention:  Support and Exploration  Summary of Progress/Problems: Alexis Christensen was not very receptive to group. She stated that she does not find forgiveness helpful and that there are people in her life who do not deserve to be forgiven. Alexis Christensen was challenged to think of forgiveness for the reason on finding personal peace rather than for the reason of the person deserving forgiveness, but was resistant to this and did not engage further in group process.  [name] 06/20/2011  2:51 PM

## 2011-06-20 NOTE — Progress Notes (Signed)
Pt continues with 1:1 for safety.  She is still passively SI.  She tends to be childlike at times in her interactions with staff.  She still reports weakness and lightheadedness when standing.  She informs this Clinical research associate that her daughter was discharged today from the C/A unit and she is worried that she will not be able to speak with her.  She is upset with her husband as he is the one who picked the daughter up and she was not able to talk with him on the phone because he went into work early. Pt tends to be tangential in thoughts at times.  Sitter with patient.  Pt remains safe.

## 2011-06-21 MED ORDER — CHLORPROMAZINE HCL 10 MG PO TABS
5.0000 mg | ORAL_TABLET | Freq: Three times a day (TID) | ORAL | Status: DC
  Administered 2011-06-21 – 2011-06-22 (×3): 5 mg via ORAL
  Filled 2011-06-21 (×8): qty 1

## 2011-06-21 NOTE — Progress Notes (Signed)
1:1 Nsg. Note:  Pt has been lying in bed with eyes closed, respirations even and unlabored.  Sitter reports uneventful 4 hours. Continue !;! For inability to contract for safety.  Cont Management consultant.  Ravenna Cellar RN

## 2011-06-21 NOTE — Progress Notes (Signed)
1:1 Nsg note  Pt has cont to be in bed with eyes closed and respirations even and unlabored. There is no change in status from previous RN observation. Will cont !;! For inability to contract for safety.

## 2011-06-21 NOTE — Progress Notes (Signed)
Patient ID: Alexis Christensen, female   DOB: 07-10-1971, 40 y.o.   MRN: [id number] Pt seen in the conference room with her 1:1 aid present.  She described that she slept all right.  She is noting some trembling of her hands to the point that she can't journal.  This is fairly helpful method for her to deal with her anxiety.  She has very faint cog wheel rigidity on the (L) wrist.  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  This could be related to the Thorazine.  Will reduce the dose of that.  She asks if she can go to the [facility].  She reports that when she is alone she gets carried away with her thoughts of how her ex is planning to take her children away from her.  She reports that is when she has suicidal thoughts.  The reason for her being on 1:1.  She still has some suicidal thoughts.  WIll continue the 1:1 for that reason.  Will allow her to go to the [facility] is she is calm and content before she goes and she or the 1:1 aide may have her come back early or she can stay for a full 30 minutes then come back to the unit.   THis was agreed upon by her , her 1:1 tech and the nurse.

## 2011-06-21 NOTE — Progress Notes (Signed)
[facility] Group Notes: (Counselor/Nursing/MHT/Case Management/Adjunct)   Type of Therapy:  Group Therapy  Participation Level:  Active  Participation Quality:  Attentive, Appropriate, and Sharing  Affect: Depressed  Cognitive:  Appropriate  Insight:  Limited  Engagement in Group: Good  Engagement in Therapy:  Good  Modes of Intervention:  Support and Exploration  Summary of Progress/Problems:  Alexis Christensen continues to focus on the negative, and filter out positives. She identified that she works hard to balance her responsibilities with being there for her family. Alexis Christensen talked about the care she takes to encourage her daughters and instill in them confidence in herself, so that they grow up not needing others to validate them. Counselor commended her on this, and Alexis Christensen went on to talk about how she has pushed herself to move forward and advocated for herself to be able to attend groups because she knows she needs the therapy. Again counselor pointed these out as positives, and Alexis Christensen minimized them. Counselor and other group members encouraged Alexis Christensen to use these things as evidence to counteract her negative thoughts about her own worth.   [name] 06/21/2011  2:23 PM

## 2011-06-21 NOTE — Tx Team (Signed)
Interdisciplinary Treatment Plan Update (Adult)  Date:  06/21/2011  Time Reviewed:  10:44 AM  Progress in Treatment: Attending groups: Yes Participating in groups:  Yes, but participation is minimal in group therapy Taking medication as prescribed:  Yes Tolerating medication: Yes Family/Significant othe contact made:  Yes, contact made with: mother, Lanora Manis Patient understands diagnosis: Yes Discussing patient identified problems/goals with staff:  Yes Medical problems stabilized or resolved: Yes Denies suicidal/homicidal ideation: No, reports on and off suicidal thoughts Issues/concerns per patient self-inventory:  No  Other:  New problem(s) identified: None  Reason for Continuation of Hospitalization: Anxiety Depression Medication stabilization Suicidal ideation  Interventions implemented related to continuation of hospitalization:  Medication stabilization, safety checks q 15 mins, group attendance  Additional comments:  Estimated length of stay: 2-3 days  Discharge Plan: Discharge home with daughters, follow up with [facility] in [place], [place]  New goal(s):  Review of initial/current patient goals per problem list:   1.  Goal(s): Eliminate suicidal thoughts  Met:  No  Target date: by discharge  As evidenced by: Davia reports continued suicidality, though she is contracting for safety  2.  Goal (s): Reduce depressive symptoms  Met:  No  Target date: by discharge  As evidenced by: Theta rating depression at a 7 today  3.  Goal(s): Reduce anxiety symptoms  Met:  No  Target date: by discharge  As evidenced by: Vaishali rating anxiety at a 9   Attendees: Patient:   06/21/2011 10:44 AM  Family:   06/21/2011 10:44 AM  Physician:  Dr Orson Aloe, MD 06/21/2011 10:44 AM  Nursing:    06/21/2011 10:44 AM  Case Manager:  Juline Patch, LCSW 06/21/2011 10:44 AM  Counselor:  Angus Palms, LCSW 06/21/2011 10:44 AM  Other:  Armandina Stammer, NP 06/21/2011 10:44 AM    Other:   06/21/2011 10:44 AM  Other:   06/21/2011 10:44 AM  Other:   06/21/2011 10:44 AM   Scribe for Treatment Team:   [name], 06/21/2011 10:44 AM

## 2011-06-21 NOTE — Progress Notes (Signed)
Patient ID: IMA HAFNER, female   DOB: [birth year], 40 y.o.   MRN: [id number] Patient has 1:1 for safety.    Self inventory form, sleeps well, requests medication for sleep, improving appetite, low energy level, improving attention span.  Rated depression and hopelessness #3.  SI thoughts, cannot contract for safety knowing that means she cannot go to [facility].  Has shaky hands, felt lightheaded, dizzy, headache in past 24 hours.  Wants to do better for her kids.  Unsure of discharge plans.  No problems taking meds after discharge. Patient has been pleasant and cooperative.   1:1 to continue per doctor.

## 2011-06-21 NOTE — Progress Notes (Signed)
Patient seen during d/c planning group.  She reports being a little better today but continues to endorse SI, depression, anxiety, hopelessness and helplessness.  She remains 1:1 and advised goal for today is to have 1:1 discharged. She advised of sleeping all night but having problems with slurred speech and drowsiness.  She requested Engineer, building services outpatient provider and notify [facility] of hospitalization.

## 2011-06-21 NOTE — Progress Notes (Signed)
Patient talked with doctor about going to [employer] with 1:1.    Dr. Informed patient that she can go to [facility] for 30 minutes if she is not upset, if she is calm patient can go to [facility] and stay 30 minutes.  If any time, patient or 1:1 MHT decides patient needs to come back to 500 hall, patient will return to 500 hall.  When patient is alone, she thinks about her children and ex-husband, which causes her to get upset.   Patient took her meds without difficulty.  Has been cooperative and pleasant this afternoon.

## 2011-06-21 NOTE — Progress Notes (Signed)
Pt. Is asleep remains a 1:1 and remains safe.

## 2011-06-21 NOTE — Progress Notes (Signed)
Pt. Will only remain at [facility] times 30 minutes as per MD order. Pt. Is accompanied with a tech.

## 2011-06-21 NOTE — Progress Notes (Signed)
Pt. Is very talkative and remains a 1:1. Pt. Did go to [place] with the other pts. Denies feeling SI or HI and does contract for safety. Remains in a wheelchair with limited mobility. Good eye contact.

## 2011-06-21 NOTE — Progress Notes (Signed)
Pt. Continues to remain a 1:1 ./States she at times does feel SI now but does contract for safety. States she has had two previous attempts in the past and her daughter has had one attempt. Pt, is very shaky and attributes this to some of her medications she is taking. MD is aware per pt. And adjustments are being made in her medication. Pt. Only stayed at [facility] times 30 minutes and had tech bring her back. Pt did c/o feeling tired. Will continue to monitor closely.

## 2011-06-22 MED ORDER — DIVALPROEX SODIUM 250 MG PO DR TAB
250.0000 mg | DELAYED_RELEASE_TABLET | Freq: Every day | ORAL | Status: AC
  Administered 2011-06-22: 250 mg via ORAL
  Filled 2011-06-22: qty 1

## 2011-06-22 MED ORDER — CARBAMAZEPINE 200 MG PO TABS
200.0000 mg | ORAL_TABLET | Freq: Every day | ORAL | Status: AC
  Administered 2011-06-24: 200 mg via ORAL
  Filled 2011-06-22: qty 1

## 2011-06-22 MED ORDER — CARBAMAZEPINE 200 MG PO TABS
200.0000 mg | ORAL_TABLET | Freq: Two times a day (BID) | ORAL | Status: AC
  Administered 2011-06-23 (×2): 200 mg via ORAL
  Filled 2011-06-22: qty 1

## 2011-06-22 NOTE — Progress Notes (Signed)
[facility] Group Notes:  (Counselor/Nursing/MHT/Case Management/Adjunct)  06/22/2011 5:40 PM   Type of Therapy:  Group Therapy  Participation Level:  Minimal  Participation Quality:  Appropriate  Affect:  Depressed  Cognitive:  Appropriate  Insight:  Fair  Engagement in Group:  Limited  Engagement in Therapy:  Limited  Modes of Intervention:  Clarification, Education, Limit-setting, Problem-solving and Support  Summary of Progress/Problems: Pt minimally participated in group which was focused on identifying pertinent areas that patient needs to address in order for things go well post discharge.  Pt stated that Type of Therapy:  Group Therapy  Participation Level:  Minimal  Participation Quality:  Appropriate  Affect:  Depressed  Cognitive:  Appropriate  Insight:  Fair  Engagement in Group:  Limited  Engagement in Therapy:  Limited  Modes of Intervention:  Clarification, Education, Limit-setting, Problem-solving and Support  Summary of Progress/Problems: Pt minimally participated in group which was focused on identifying pertinent areas that patient needs to address in order for things go well post discharge.  Pt stated that she needs to let her sister know that she was suicidal.  Pt expressed fear that her sister will not understand  Therapist prompted Pt to identify behaviors that needed to be changed, activities that need to be included or excluded, and what needs to take place in order to execute those plans.  Pt agreed to tell her sister during the family session when she has support.   Intervention Effective.   Marni Griffon C 06/22/2011, 5:40 PM

## 2011-06-22 NOTE — Progress Notes (Signed)
Recreation Therapy Group Note  Date: 12/072012         Time: 1430      Group Topic/Focus: The focus of this group is on enhancing patients' problem solving skills, which involves identifying the problem, brainstorming solutions and choosing and trying a solution.   Participation Level: Minimal  Participation Quality: Monopolizing  Affect: Labile  Cognitive: Oriented   Additional Comments: Patient complaining of being unable to use her arms and reports not understanding the activity, but was observed offering logical assistance to peers during the activity.   Eleonore Shippee 06/22/2011 3:35 PM

## 2011-06-22 NOTE — Progress Notes (Addendum)
Patient ID: Alexis Christensen, female   DOB: 06-08-1971, 40 y.o.   MRN: [id number] Pt seen in day room with 1:1 aide present.  Patient stated that she slept wonderful last night.  She reports that her anxiety is not as bad, only based on her frustration with her not being able to write.  This could be due to the Tegretol so will switch to Depakote.    I question the benefit she gets from Thorazine especially at such a low dose, so will try her off that and see if that helps.    She has gotten support today from someone she talked to on a visit.  This is very encouraging for her.  She is expecting that her suicidal ideation will be gone in the AM.  WIll have provider staff reassess this in the AM.    She has been placed back in the W/C by staff.  She almost tripped going to the shower this AM.  She wants to try to get out of the W/C tomorrow.  This sounds like a good plan.  I am unable to get orders for Depakote to follow a cross over pattern.  This will need to be corrected in AM.

## 2011-06-22 NOTE — Progress Notes (Signed)
Patient seen during discharge planning group.  She reports sleeping okay last night.  She continues to endorse SI and advised she is unable to contract for safety.  She remains 1:1.  Patient states MD is making changes to medications and that she is experiencing trembling and difficulty holding on to objects.  Patient to informed MD of problems.  Per patient request, message left for Alexis Christensen at [facility] advising of hospitalization.  Suicide prevention reviewed during group.

## 2011-06-22 NOTE — Progress Notes (Signed)
[facility] Group Notes:  (Counselor/Nursing/MHT/Case Management/Adjunct)  06/22/2011 6:10 PM Type of Therapy:  Psychoeducational Skills  Participation Level:  Minimal  Participation Quality:  Appropriate  Affect:  Depressed  Cognitive:  Appropriate  Insight:  Limited  Engagement in Group:  Limited  Engagement in Therapy:  fair  Modes of Intervention:  Clarification, Education, Limit-setting, Problem-solving, Socialization, Support and Stage manager of Progress/Problems:Type of Therapy:  Psychoeducational Skills  Summary of Progress/Problems:  Pt participated in group focused on defining recovery and recommendations of how to receive continued support through the [employer].  Pt listened attentively and asked pertinent questions.  Pt was visibly moved by the quest speaker's story.  Pt was receptive to seeking out the [employer] to enhance recovery.  Intervention Effective.   Marni Griffon C 06/22/2011, 6:10 PM

## 2011-06-22 NOTE — Progress Notes (Signed)
Pt states she slept well, appetite is poor. Energy level low. Pt rates her depression as a 4, hopelessness as a 3. Pt remains on 1:1 OBS for safety. All nursing notes on paper as per 1:1 protocol. Safety maintained.

## 2011-06-23 MED ORDER — MENTHOL 3 MG MT LOZG
1.0000 | LOZENGE | OROMUCOSAL | Status: DC | PRN
  Administered 2011-06-23: 3 mg via ORAL

## 2011-06-23 NOTE — Progress Notes (Signed)
Pt appears to be doing well on her 1:1. States that she wants to come off and the reason she gave is she would like to have her nose ring back. Was told 'no' to the nose ring and then started pouting. When she found she could not get off the 1:1 today, she became angry. States she was told that she could get out of the wheelchair and when she was told different, she cried. Requested to do hourly contracts but it was decided she would keep her 1:1 until at least tomorrow.

## 2011-06-23 NOTE — Progress Notes (Signed)
Pt came to medication window to receive her 2200 scheduled medication. Pt inquired as to why she was not getting her Depakote and writer explained that the order for this medication was showing completed in the [facility]. Writer advised patient to speak with her doctor concerning this medication. Pt. Inquired if she had any medication to aid with sleep if needed and patient was informed that she has visteril available. Pt. was pleasant and cooperative, 1:1 continues with MHT at pt's side. Patient denies having pain, -si/hi/a/v hall. Safety maintained, will continue to monitor.
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
## 2011-06-23 NOTE — Progress Notes (Signed)
[facility] Group Notes:  (Counselor/Nursing/MHT/Case Management/Adjunct)  06/23/2011 5:19 PM  Type of Therapy:  Counseling  Participation Level:  Active  Participation Quality:  Appropriate  Affect:  Appropriate  Cognitive:  Appropriate  Insight:  Good  Engagement in Group:  Good  Engagement in Therapy:  Good  Modes of Intervention:  Clarification and Support  Summary of Progress/Problems:Pt. participated in group discussion on self sabotaging behavior/thoughts and how they can enable themselves in a positive way to make the changes they need to. Pt. Spoke about her self harm and cutting and discussed way she would develop new coping skills in order to deal with her need to cut/self harm herself. Pt. Spoke about daughters and her childhood and her struggles also.    Lamar Blinks [place] 06/23/2011, 5:19 PM

## 2011-06-23 NOTE — Progress Notes (Signed)
[facility] Group Notes:  (Counselor/Nursing/MHT/Case Management/Adjunct)  06/23/2011 11:00 AM  Pt. attended after care planning group and was given [place] Suicide prevention information along with crisis and hotline numbers. Pt. States she came to [facility] last week for overdose and discussed her frustration with not being able to see her daughter who was a patient on the child/adolescent unit. Pt. states she is feeling better but is having problems with her mobility and that she hopes not to be in a wheel chair soon. Pt. States she is on a 1:1 and hopes to be off it soon also. Pt. Was optimistic about her recovery.  Lamar Blinks [place] 06/23/2011, 11:00 AM

## 2011-06-23 NOTE — Progress Notes (Signed)
Pt siting in the dayroom interacting with others. Feels that she would like to do hourly contracts instead of have a 1:!. Was told that we would work on issues together. Given support, reassurance and praise.

## 2011-06-24 NOTE — Progress Notes (Signed)
1000 Pt has been sitting in the dayroom interacting with peers. Walking slow with a walker stating "I am trying..I am trying" Will say thing to this writer "I am being a good girl". Laughing and joking with her peers. States that she is no longer having thoughts of hurting herself. Remains safe.

## 2011-06-24 NOTE — Progress Notes (Signed)
1:1 nursing note- Pt lying in bed asleep eyes closed resp. even and unlabored, no distress noted, safety maintained on unit with mht by pt's bedside. Patient is safe will continue to monitor.

## 2011-06-24 NOTE — Progress Notes (Signed)
Pt  awake and preparing to get dressed for the day, no complaints voiced, 1:1 continues and pt remains safe. Will continue to monitor.

## 2011-06-24 NOTE — Progress Notes (Signed)
Pt Continues to walk slow with the walker and when staff is observing walks slower and continues to state that she is being "a good girl". Laughing and fooling around with other patients, coloring in a coloring book and appearing weak when being observed. Remains safe.

## 2011-06-24 NOTE — Progress Notes (Signed)
[facility] Group Notes:  (Counselor/Nursing/MHT/Case Management/Adjunct)  06/24/2011 11:55 AM  Pt. attended after care planning group  and was given [place] Suicide Prevention Information, crisis and hotline numbers, [facility] information, and information on Therapeutic alternatives located in [place]. Pt.'s in group were encouraged to seek supports in groups, agencies , and others when being discharged from [facility]. The pt. Spoke about wanting to stop using the wheel chair and use the walker to walk. Pt. stated she saw the PA yesterday and she stated that the pt. would have to remain in a wheelchair for safety/balance issues and that she would have to remain on a 1:1.  Neila Gear 06/24/2011, 11:55 AM

## 2011-06-24 NOTE — Progress Notes (Signed)
Pt continues to use her walker and is doing so slowly. Sits in the dayroom, when not in a group and colors with crayons. Speech, mannerisms and behavior are childlike. Enjoys the attention that she is getting from her 1:1. Given support.

## 2011-06-24 NOTE — Progress Notes (Signed)
  GEANINE VANDEKAMP is a 40 y.o. female [id number] Mar 29, 1971  06/15/2011 Active Problems:  * No active hospital problems. *   Bipolar  Mental Status:alert & oriented denies SI/HI/AVH.     Subjective/Objective:Daughter was released from adolescent unit last week. Patient asking to use a walker -will allow since she still has 1:1 Reports anxiety but mood and affect are calm.    Filed Vitals:   06/23/11 0803  BP: 103/72  Pulse: 85  Temp: 97.9 F (36.6 C)  Resp: 18    Lab Results:   BMET    Component Value Date/Time   NA 137 06/14/2011 2350   K 3.8 06/14/2011 2350   CL 102 06/14/2011 2350   CO2 27 06/14/2011 2350   GLUCOSE 103* 06/14/2011 2350   BUN 11 06/14/2011 2350   CREATININE 0.70 06/14/2011 2350   CALCIUM 9.5 06/14/2011 2350   GFRNONAA >90 06/14/2011 2350   GFRAA >90 06/14/2011 2350    Medications:  Scheduled:     . buPROPion  200 mg Oral BID  . carbamazepine  200 mg Oral BID AC   Followed by  . carbamazepine  200 mg Oral Q0600  . ciprofloxacin  250 mg Oral BID  . gabapentin  800 mg Oral TID AC  . levothyroxine  150 mcg Oral QAC breakfast  . lithium carbonate  600 mg Oral Q12H  . prazosin  2 mg Oral QHS  . topiramate  200 mg Oral BID     PRN Meds acetaminophen, alum & mag hydroxide-simeth, feeding supplement, hydrOXYzine, magnesium hydroxide, menthol-cetylpyridinium Plan: Let her use the walker with supervision.  Xiadani Damman,MICKIE D. 06/24/2011

## 2011-06-24 NOTE — Progress Notes (Signed)
[facility] Group Notes:  (Counselor/Nursing/MHT/Case Management/Adjunct)  06/24/2011 5:50 PM  Type of Therapy:  Counseling  Participation Level:  Active  Participation Quality:  Attentive  Affect:  Appropriate  Cognitive:  Appropriate  Insight:  Good  Engagement in Group:  Good  Engagement in Therapy:  Good  Modes of Intervention:  Clarification and Support  Summary of Progress/Problems: Pt. Participated in group discussion on supports and how to find supports when they are not healthy and how to support themselves in their recovery from depression.  Lamar Blinks [place] 06/24/2011, 5:50 PM

## 2011-06-25 NOTE — Tx Team (Signed)
Interdisciplinary Treatment Plan Update (Adult)  Date:  06/25/2011   Time Reviewed:  10:55 AM  Progress in Treatment: Attending groups: Yes Participating in groups:  Yes Taking medication as prescribed:  Yes Tolerating medication: Yes Family/Significant othe contact made:  Yes, contact made with:  Patient understands diagnosis: Yes Discussing patient identified problems/goals with staff:  Yes Medical problems stabilized or resolved: Yes Denies suicidal/homicidal ideation: Yes Issues/concerns per patient self-inventory:  No  Other:  New problem(s) identified: None  Reason for Continuation of Hospitalization: Anxiety Depression Medication stabilization  Interventions implemented related to continuation of hospitalization:  Medication stabilization, safety checks q 15 mins, group attendance, mood stabilization  Additional comments: Pt placed on a silent 1:1 for fall risk  Estimated length of stay: 1-2 days  Discharge Plan:  New goal(s):  Review of initial/current patient goals per problem list:  1. Goal(s): Eliminate suicidal thoughts  Met: No  Target date: by discharge  As evidenced by: Haevyn reports continued suicidality, though she is contracting for safety   2. Goal (s): Reduce depressive symptoms  Met: No  Target date: by discharge  As evidenced by: Makayela rating depression at a 7 today   3. Goal(s): Reduce anxiety symptoms  Met: No  Target date: by discharge  As evidenced by: Gracelin rating anxiety at a 9   Attendees: Patient:   06/25/2011 10:00 AM  Family:   06/25/2011 10:00 AM  Physician:  Dr Orson Aloe, MD 06/25/2011 10:00 AM  Nursing:   Neill Loft, RN 06/25/2011 10:00 AM  Case Manager:  Juline Patch, LCSW 06/25/2011 10:00 AM  Counselor:  Angus Palms, LCSW 06/25/2011 10:00 AM  Other:  Reyes Ivan, LCSWA 06/25/2011 10:00 AM  Other:  Armandina Stammer, NP 06/25/2011 10:00 AM  Other:  Charlyne Mom, psych intern 06/25/2011 10:00 AM  Other:   Quintella Reichert, RN 06/25/2011 10:00 AM   Scribe for Treatment Team:   [name], 06/25/2011 10:00 AM

## 2011-06-25 NOTE — Progress Notes (Addendum)
Patient ID: Alexis Christensen, female   DOB: 08-12-1970, 40 y.o.   MRN: [id number] Patient cooperative and pleasant.   Using walker and has  1:1 for safety.   Patient is now on silent 1:1.   Encouraged patient to fill out self inventory sheet.   Has been going to groups today.  1545   Patient presently resting in bed with eyes closed .  Went to group this afternoon.   Silent 1:1 continues for safety.   Safety maintained.  Will continue to monitor for safety.   Respirations even and unlabored.  No signs of pain or discomfort on patient's face or body movements.    1815   Patient stated she had filled out self inventory sheet earlier today which was found.  On self inventory patient stated she sleeps fair, has improving appetite, ate 1/4th breakfast.  Has improving attention span.   Rated depression and hopelessness #5.  Denied withdrawals.  Denied SI.  No physical problems in past 24 hours.   Plans to be better mom after discharge.  Not sure of discharge plans yet.  No problems staying on her medications after discharge. Patient has been cooperative and pleasant today.   No longer using walker to ambulate.   1:1 in silence continuing per MD instructions.  No signs of pain/discomfort on patient's face or body movements.  Patient remains safe.
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
## 2011-06-25 NOTE — Progress Notes (Addendum)
Patient ID: Alexis Christensen, female   DOB: 24-Aug-1970, 40 y.o.   MRN: [id number]  [id number]   Patient took medications without difficulty.   1:1 present.  Denied SI, contracted for safety.   Stated she talked to her daughter/ family over the weekend.  Patient was cooperative and pleasant.  1000   1:1 continues with patient.   Patient has been cooperative and pleasant.  Went to group this morning, interacting with peers.   Safety maintained.    11:00  Explained to patient that she is now on a silent 1:1, MHT also informed.  Per MD instructions does not have to use her walker per patient's request.  Patient's safety maintained, 1:1 to continue per MD.  1200   Family member visited patient during lunch and CM talked to family member and to patient per patient's request.  Patient has been sitting in dayroom with family member.   1:1 continues for safety.  Patient has been very cooperative and pleasant this morning.  No pain or distress noted or voiced by patient.

## 2011-06-25 NOTE — Progress Notes (Signed)
[facility] MD Progress Note  06/25/2011 4:53 PM  Patient is seen during rounds this am with sitter present. Patient reports doing well as mood continue to improve. She is currently ambulating with walker. Made a request to allow her to try ambulating without walker, but continue 1:1 supervision.  Diagnosis:   Axis I: Bipolar, Manic, PTSD Axis II: Deferred Axis III:  Past Medical History  Diagnosis Date  . Bipolar 1 disorder   . Thyroid disease   . Post traumatic stress disorder (PTSD)   . Anxiety   . Depression   . Headache    Axis IV: No changes Axis V: 51-60 moderate symptoms  ADL's:  Intact  Sleep:  Yes  Appetite:  Yes  Suicidal Ideation:   Plan:  No  Intent:  No  Means:  No  Homicidal Ideation:   Plan:  No  Intent:  No  Means:  No  Mental Status: General Appearance /Behavior:  Casual Eye Contact:  Good Motor Behavior:  Normal Speech:  Normal Level of Consciousness:  Alert Mood:  Euthymic Affect:  Appropriate Anxiety Level:  None Thought Process:  Coherent Thought Content:  WNL Perception:  Normal Judgment:  Good Insight:  Present Cognition:  Orientation time, place and person Sleep:  Number of Hours: 4.25   Vital Signs:Blood pressure 112/76, pulse 85, temperature 97.9 F (36.6 C), temperature source Oral, resp. rate 18.  Lab Results: No results found for this or any previous visit (from the past 48 hour(s)). Treatment Plan Summary: Daily contact with patient to assess and evaluate symptoms and progress in treatment Medication management  Plan: Discontinue use of walker. Keep supervision.           Continue current treatment plan.           Continue group counseling and activities.  Armandina Stammer I 06/25/2011, 4:53 PM
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
## 2011-06-25 NOTE — Progress Notes (Signed)
Pt attended AM group.  Good participation. Came in due to SI, overwhelmed with daughter in the home, who apparently also ended up here in inpt care on child/adolescent end.  Denies SI.  Is still on 1:1, but due to fall precautions.  Plans to return home, follow up with outpt providers.  In fact, has an appointment with "Kenyon Ana" on Thursday that she is focused on getting to.  Was called to meet with pt's father over lunch.  He and her mother live out of town, so are not available for on-going daily support.  He is concerned as pt has had multiple hospitalizations, she appears to be mixing and matching meds in the home on her own [he found many med bottles, both empty and full].  Pt has no informal supports.  Spoke with pt and father about ACT team.  Both felt that sounded like a good option.  Father requested family session prior to d/c.  Passed this request on to counselor  [place].   Called [facility] to confirm service they provide.  They are an outpt clinic.  Told them of our thoughts of ACT team, and in fact, they had just staffed this case and had come to the same conclusion.  They are making referral to ACT.

## 2011-06-25 NOTE — Progress Notes (Signed)
Pt at the window taking her 2000 meds.  She reports a good day.  She is still on 1:1 for safety, but the MD is allowing her to ambulate without the walker.  She is glad for this and seems to be steady in gait per this Clinical research associate.  She voices no complaints or needs at this time.  The previous nurse reports that her father-in-law came to visit and is supportive, but pt's husband(his son) no longer wants contact with her.  The husband has the children.  The pt did not share this in our conversation.  She tells this Clinical research associate that she is probably going to be discharged in two days.  Pt seems brighter in mood than last week when this writer was here.  Pt denies SI/HI.

## 2011-06-26 MED ORDER — DIVALPROEX SODIUM 250 MG PO DR TAB
250.0000 mg | DELAYED_RELEASE_TABLET | Freq: Three times a day (TID) | ORAL | Status: DC
  Administered 2011-06-26 – 2011-06-27 (×4): 250 mg via ORAL
  Filled 2011-06-26 (×10): qty 1
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
## 2011-06-26 NOTE — Progress Notes (Signed)
Pt observed in the dayroom watching TV with peers.  At this time she is quiet/calm, sitting on the couch wrapped in a blanket.  This Clinical research associate asked pt to come to the med window for her 2000 meds.  Pt cooperative and easily engaged in conversation.  She reports having a good day.  She was taken off 1:1 obs this morning and has done well emotionally and physically.  She reports to this Clinical research associate that she is probably going to discharge tomorrow.  She says her F-in-L is getting her car ready for her to use when she leaves.  She does not mention her children or her estranged husband.  Pt encouraged to make her needs known.  She denies SI/HI.   She voices no complaints at this time.  Safety maintained with q15 minute checks.

## 2011-06-26 NOTE — Progress Notes (Signed)
Patient ID: Alexis Christensen, female   DOB: 04/05/1971, 41 y.o.   MRN: [id number]  Patient denied SI and HI this morning.  Denied A/V hallucinations.   Denied pain.   No longer using walker.  Silent 1:1 for patient's safety. On self inventory sheet, patient stated she sleeps well and requested medication, has improving appetite, 50% of meals, normal energy level, improving attention span.  Rated hopelessness #6.  Denied SI.  Plans to "be best mommy I can be!"  Unsure of discharge plans.  No problems taking meds after discharge. Patient has been cooperative and pleasant.  1:1 silent will continue per MD order.

## 2011-06-27 MED ORDER — GABAPENTIN 800 MG PO TABS: 1200.0000 mg | ORAL_TABLET | Freq: Three times a day (TID) | ORAL | Status: DC

## 2011-06-27 MED ORDER — TOPIRAMATE 200 MG PO TABS: 200.0000 mg | ORAL_TABLET | Freq: Two times a day (BID) | ORAL | Status: DC

## 2011-06-27 MED ORDER — LITHIUM CARBONATE ER 300 MG PO TBCR: 600.0000 mg | EXTENDED_RELEASE_TABLET | Freq: Two times a day (BID) | ORAL | Status: AC

## 2011-06-27 MED ORDER — LEVOTHYROXINE SODIUM 150 MCG PO TABS: 150.0000 ug | ORAL_TABLET | Freq: Every day | ORAL | Status: DC

## 2011-06-27 MED ORDER — PRAZOSIN HCL 2 MG PO CAPS: 2.0000 mg | ORAL_CAPSULE | Freq: Every day | ORAL | Status: DC
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
## 2011-06-27 NOTE — Progress Notes (Signed)
[facility] Group Notes: (Counselor/Nursing/MHT/Case Management/Adjunct)   Type of Therapy:  Group Therapy  Participation Level:  Active  Participation Quality:  Appropriate, Attentive, Sharing and Supportive  Affect:  Appropriate  Cognitive:  Appropriate  Insight:  Good  Engagement in Group: Good  Engagement in Therapy:  Good  Modes of Intervention:  Support and Exploration  Summary of Progress/Problems: Alexis Christensen was very involved in group therapy, stating that she has had trouble controlling her emotions and plans to implement skills she has learned here, such as focusing on thoughts rather than her feeling and acting from a point of logic. She explored her own experience with death in order to help another group member in grief processing, and gave insightful and encouraging feedback.    [name] 06/27/2011  3:24 PM

## 2011-06-27 NOTE — Progress Notes (Addendum)
[facility] Group Notes: (Counselor/Nursing/MHT/Case Management/Adjunct)   Type of Therapy: Group Therapy   Participation Level: Active   Participation Quality: Attentive, Appropriate and Sharing   Affect: Blunted   Cognitive: Appropriate  Insight: Good  Engagement in Group: Good   Engagement in Therapy: Good  Modes of Intervention: Support and Exploration   Summary of Progress/Problems: Alexis Christensen was engaged and identified feelings that are difficult for her to experience, including loneliness and anger. She processed the loneliness she experiences when her children are not at her house, and identified that the things she does during those times - watching sad movies - tends to magnify her loneliness and depression. Alexis Christensen explored other things that she can do - cross-stitching, taking a bubble bath - and how those things may impact her mood. She was receptive and open to reframing the lonely feelings as an opportunity for independence. Alexis Christensen worked hard to find positive reframes for emotions that she has trouble with, and identified this as a good exercise for her to help herself act from a point of thoughts rather than emotions.    [name] 06/27/2011  3:43 PM

## 2011-06-27 NOTE — Progress Notes (Signed)
Suicide Risk Assessment  Discharge Assessment     Demographic factors:  Assessment Details Time of Assessment: Admission Information Obtained From: Patient Current Mental Status:  Current Mental Status: Suicidal ideation indicated by patient Risk Reduction Factors:  Risk Reduction Factors: Responsible for children under 42 years of age;Sense of responsibility to family;Religious beliefs about death;Employed;Positive social support  CLINICAL FACTORS:   Severe Anxiety and/or Agitation Bipolar Disorder:   Bipolar II Personality Disorders:   Cluster B Previous Psychiatric Diagnoses and Treatments Medical Diagnoses and Treatments/Surgeries  COGNITIVE FEATURES THAT CONTRIBUTE TO RISK:  Thought constriction (tunnel vision)    SUICIDE RISK:   Minimal: No identifiable suicidal ideation.  Patients presenting with no risk factors but with morbid ruminations; may be classified as minimal risk based on the severity of the depressive symptoms  Patient denies suicidal or homicidal ideation, hallucinations, illusions, or delusions. Patient engages with good eye contact, is able to focus adequately in a one to one setting, and has clear goal directed thoughts. Patient speaks with a natural conversational volume, rate, and tone. Anxiety was reported at 1 on a scale of 1 the least and 10 the most. Depression was reported also at 1 on the same scale. Patient is oriented times 4, recent and remote memory intact. Judgement: Improved from admission Insight: Has learned that she needs to stay on her meds, keep her focus and purpose for life. Plan:  Remain on . buPROPion  200 mg Oral BID  . divalproex  250 mg Oral TID  . gabapentin  800 mg Oral TID AC  . levothyroxine  150 mcg Oral QAC breakfast  . lithium carbonate  600 mg Oral Q12H  . prazosin  2 mg Oral QHS  . topiramate  200 mg Oral BID  Because Wellbutrin helps mood and focus, Depakote and Lithium helps control mood up and down, Thyroid replaces  the low thyroid level, Lithium helps with depression also maybe better than Depakote, Minipress helps control nightmares and helps get to sleep and stay asleep, Topamax helps with pain. Mood, and may reduce appetite. Pt voices understanding of the risks and benefits of medication. Follow-up is with Follow-up Information    Follow up with Dr. Lyda Perone - [facility] on 06/28/2011. (You are scheduled with Dr. Lyda Perone at [facility] on Thursday, 12/13 at 4:00 p.m.)    Contact information:   [address] [place], [place]  [address]  [phone]        Orson Aloe 06/27/2011, 2:38 PM

## 2011-06-27 NOTE — Progress Notes (Signed)
Patient ID: Alexis Christensen, female   DOB: 11-Apr-1971, 40 y.o.   MRN: [id number] Writer reviewed discharge instructions with pt and family including medications, follow up appointments and crisis intervention. Pt verbalized understanding of instructions and writer answered all questions. Pt denies SI/HI and A/V hallucinations and has no reservations about leaving [facility] at this time. Pt mood and affect were appropriate to the situation. Pt belongings were returned upon discharge.

## 2011-06-27 NOTE — Discharge Summary (Signed)
Discharge Note  Patient:  Alexis Christensen is an 40 y.o., female DOB:  24-Feb-1971  Date of Admission:  06/15/2011  Date of Discharge:  06/27/2011  Level of Care:  OP  Discharge destination:  Home  Is patient on multiple antipsychotic therapies at discharge:  No    Patient phone:  [phone] (home) Patient address:   [address] [place] [address]  The patient received suicide prevention pamphlet:  Yes Belongings returned:  Valuables  Dan Humphreys, Ugo Thoma 06/27/2011,2:56 PM

## 2011-06-27 NOTE — Progress Notes (Addendum)
Patient seen during discharge planning group.  She reports being ready to go home and take care of daughter who attempted suicide.  Patient stated she is a good mother and her life is worth living.  Suicide prevention education reviewed.  Patient will f/u with Frederich Chick on 06/28/11.

## 2011-06-27 NOTE — Discharge Summary (Signed)
Patient ID: Alexis Christensen MRN: [id number] DOB/AGE: June 04, 1971 40 y.o.  Admit date: 06/15/2011 Discharge date: 06/27/2011  Reason for Admission: Pt took overdose of her medications when she felt overwhelmed with her life and what was happening in her daughter's life. Hospital Course:  Pt was admitted and challenged to work on making her life different  She met that challenge by working in groups, individual, and milieu therapy.  She realized that she did have a purpose in her life and that she could change.  She discovered that the Klonopin made her unsteady on her feet and after she stopped it she was better able to walk.  She was adjusted on Lithium and Depakote to help balance out her mood and they worked well for that.  She was adjusted on Minipress for sleep and saw benefit from that too.  She tried to focus on her daughter who was in the hospital during part of her admission, but when refocused on herself, she was able to make some important gains.  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 Patient engages with good eye contact, is able to focus adequately in a one to one setting, and has clear goal directed thoughts. Patient speaks with a natural conversational volume, rate, and tone. Anxiety was reported at 1 on a scale of 1 the least and 10 the most. Depression was reported also at 1 on the same scale. Patient is oriented times 4, recent and remote memory intact. Judgement: Improved from admission Insight: Has learned that she needs to stay on her meds, keep her focus and purpose for life. Plan:  Remain on . buPROPion  200 mg Oral BID  . divalproex  250 mg Oral TID  . gabapentin  800 mg Oral TID AC  . levothyroxine  150 mcg Oral QAC breakfast  . lithium carbonate  600 mg Oral Q12H  . prazosin  2 mg  Oral QHS  . topiramate  200 mg Oral BID  Because Wellbutrin helps mood and focus, Depakote and Lithium helps control mood up and down, Thyroid replaces the low thyroid level, Lithium helps with depression also maybe better than Depakote, Minipress helps control nightmares and helps get to sleep and stay asleep, Topamax helps with pain. Mood, and may reduce appetite. Pt voices understanding of the risks and benefits of medication.  Current Discharge Medication List    START taking these medications   Details  lithium carbonate (LITHOBID) 300 MG CR tablet Take 2 tablets (600 mg total) by mouth every 12 (twelve) hours. Qty: 60 tablet, Refills: 0    prazosin (MINIPRESS) 2 MG capsule Take 1 capsule (2 mg total) by mouth at bedtime. Qty: 30 capsule, Refills: 0      CONTINUE these medications which have CHANGED   Details  gabapentin (NEURONTIN) 800 MG tablet Take 1.5 tablets (1,200 mg total) by mouth 3 (three) times daily. Qty: 90 tablet, Refills: 0    levothyroxine (SYNTHROID, LEVOTHROID) 150 MCG tablet Take 1 tablet (150 mcg total) by mouth daily before breakfast. Qty: 30 tablet, Refills: 0    topiramate (TOPAMAX) 200 MG tablet Take 1 tablet (200 mg total) by mouth 2 (two) times daily. Qty: 30 tablet, Refills: 0      CONTINUE these medications which have NOT CHANGED   Details  acetaminophen (TYLENOL) 500 MG tablet Take 500 mg by mouth every 6 (six) hours  as needed. For bad headache     buPROPion (WELLBUTRIN SR) 200 MG 12 hr tablet Take 200 mg by mouth 2 (two) times daily.      hydrOXYzine (ATARAX/VISTARIL) 50 MG tablet Take 50 mg by mouth every 6 (six) hours as needed. Anxiety       STOP taking these medications     clonazePAM (KLONOPIN) 1 MG tablet      lithium 600 MG capsule        Follow-up Information    Follow up with Dr. Lyda Perone - [facility] on 06/28/2011. (You are scheduled with Dr. Lyda Perone at [facility] on Thursday, 12/13 at 4:00 p.m.)    Contact information:   [address] [place], [place]  [address]  [phone]         Signed: Orson Aloe 06/27/2011, 2:45 PM

## 2011-06-27 NOTE — Progress Notes (Signed)
Patient ID: Alexis Christensen, female   DOB: 1971/05/16, 40 y.o.   MRN: [id number] Pt is awake and active on the unit this AM. Pt is participating in milieu and is cooperative with staff. Pt is enthusiastic about leaving today. Pt states that she has two beautiful daughters that need her and she wants to be there for them. Pt plans to take medication as prescribed and also has follow up care arranged after discharge. Pt denies SI/HI and A/V hallucinations. Writer will continue to monitor.

## 2011-07-03 NOTE — Progress Notes (Signed)
Patient Discharge Instructions:  Admission Note Faxed,  12/182012 Discharge Note Faxed,   07/03/2011 After Visit Summary Faxed,  07/03/2011 Faxed to the Next Level Care provider:  07/03/2011 D/C Summary faxed 07/03/2011 Facesheet faxed 07/03/2011  Received signed consent to release today, Faxed to [facility] @ [phone]   Wandra Scot, 07/03/2011, 11:32 AM

## 2011-07-29 ENCOUNTER — Emergency Department: Payer: Self-pay | Admitting: *Deleted

## 2011-08-19 ENCOUNTER — Emergency Department: Payer: Self-pay | Admitting: Emergency Medicine

## 2011-10-20 ENCOUNTER — Emergency Department: Payer: Self-pay | Admitting: *Deleted

## 2011-11-10 ENCOUNTER — Emergency Department: Payer: Self-pay | Admitting: *Deleted

## 2011-11-16 DIAGNOSIS — G43109 Migraine with aura, not intractable, without status migrainosus: Secondary | ICD-10-CM | POA: Insufficient documentation

## 2011-12-17 ENCOUNTER — Ambulatory Visit: Payer: Self-pay | Admitting: Internal Medicine

## 2011-12-29 ENCOUNTER — Emergency Department: Payer: Self-pay | Admitting: Emergency Medicine

## 2012-05-03 ENCOUNTER — Emergency Department: Payer: Self-pay | Admitting: *Deleted

## 2012-06-11 ENCOUNTER — Ambulatory Visit: Payer: Self-pay | Admitting: Podiatry

## 2012-07-16 HISTORY — PX: SHOULDER SURGERY: SHX246

## 2012-08-06 ENCOUNTER — Ambulatory Visit: Payer: Self-pay | Admitting: Gastroenterology

## 2012-08-22 ENCOUNTER — Ambulatory Visit: Payer: Self-pay | Admitting: Gastroenterology

## 2012-08-25 ENCOUNTER — Ambulatory Visit: Payer: Self-pay | Admitting: Nurse Practitioner

## 2012-09-01 ENCOUNTER — Ambulatory Visit: Payer: Self-pay | Admitting: Orthopedic Surgery

## 2012-10-08 ENCOUNTER — Ambulatory Visit: Payer: Self-pay | Admitting: Orthopedic Surgery

## 2012-10-20 ENCOUNTER — Ambulatory Visit: Payer: Self-pay | Admitting: Orthopedic Surgery

## 2012-10-20 LAB — BASIC METABOLIC PANEL
BUN: 12 mg/dL (ref 7–18)
Calcium, Total: 8.2 mg/dL — ABNORMAL LOW (ref 8.5–10.1)
Co2: 32 mmol/L (ref 21–32)
EGFR (African American): >60
Glucose: 101 mg/dL — ABNORMAL HIGH (ref 65–99)
Osmolality: 279 (ref 275–301)
Potassium: 3.6 mmol/L (ref 3.5–5.1)
Sodium: 140 mmol/L (ref 136–145)

## 2012-10-20 LAB — CBC
HCT: 36.7 % (ref 35.0–47.0)
MCH: 30.9 pg (ref 26.0–34.0)

## 2012-11-06 ENCOUNTER — Inpatient Hospital Stay: Payer: Self-pay | Admitting: Orthopedic Surgery

## 2012-11-07 LAB — HEMOGLOBIN: HGB: 11.2 g/dL — ABNORMAL LOW (ref 12.0–16.0)

## 2012-11-07 LAB — BASIC METABOLIC PANEL
Calcium, Total: 7.9 mg/dL — ABNORMAL LOW (ref 8.5–10.1)
Chloride: 109 mmol/L — ABNORMAL HIGH (ref 98–107)
EGFR (African American): >60
Glucose: 91 mg/dL (ref 65–99)
Sodium: 138 mmol/L (ref 136–145)

## 2012-11-10 LAB — PATHOLOGY REPORT

## 2012-11-12 ENCOUNTER — Ambulatory Visit: Payer: Self-pay | Admitting: Orthopedic Surgery

## 2012-12-04 ENCOUNTER — Ambulatory Visit: Payer: Self-pay | Admitting: Orthopedic Surgery

## 2012-12-18 DIAGNOSIS — M51369 Other intervertebral disc degeneration, lumbar region without mention of lumbar back pain or lower extremity pain: Secondary | ICD-10-CM | POA: Insufficient documentation

## 2012-12-18 DIAGNOSIS — M5136 Other intervertebral disc degeneration, lumbar region: Secondary | ICD-10-CM | POA: Insufficient documentation

## 2013-01-11 ENCOUNTER — Emergency Department: Payer: Self-pay | Admitting: Internal Medicine

## 2013-03-16 ENCOUNTER — Emergency Department: Payer: Self-pay | Admitting: Emergency Medicine

## 2013-03-20 ENCOUNTER — Emergency Department: Payer: Self-pay | Admitting: Emergency Medicine

## 2013-03-31 ENCOUNTER — Ambulatory Visit: Payer: Self-pay | Admitting: Internal Medicine

## 2013-04-29 ENCOUNTER — Encounter: Payer: Self-pay | Admitting: *Deleted

## 2013-05-11 ENCOUNTER — Ambulatory Visit (INDEPENDENT_AMBULATORY_CARE_PROVIDER_SITE_OTHER): Payer: Medicare Other | Admitting: General Surgery

## 2013-05-11 ENCOUNTER — Encounter: Payer: Self-pay | Admitting: General Surgery

## 2013-05-11 VITALS — BP 120/70 | HR 72 | Resp 12 | Ht 66.0 in | Wt 217.0 lb

## 2013-05-11 DIAGNOSIS — D2110 Benign neoplasm of connective and other soft tissue of unspecified upper limb, including shoulder: Secondary | ICD-10-CM

## 2013-05-11 DIAGNOSIS — D1720 Benign lipomatous neoplasm of skin and subcutaneous tissue of unspecified limb: Secondary | ICD-10-CM

## 2013-05-11 HISTORY — DX: Benign lipomatous neoplasm of skin and subcutaneous tissue of unspecified limb: D17.20

## 2013-05-11 NOTE — Progress Notes (Signed)
Patient ID: Alexis Christensen, female   DOB: [birth year], 42 y.o.   MRN: [id number]  Chief Complaint  Patient presents with  . Other    cyst    HPI Alexis Christensen is a 42 y.o. female here for a evaluation of a cyst on her left arm.. Patient states its been there for about 2 months  . She state it has gotten bigger some and has mild pain/soreness occasionally.  HPI  Past Medical History  Diagnosis Date  . Bipolar 1 disorder   . Thyroid disease   . Post traumatic stress disorder (PTSD)   . Anxiety   . Depression   . WUJWJXBJ(478.2)     Past Surgical History  Procedure Laterality Date  . Wrist surgery    . Ankle surgery    . Knee arthroscopy    . Tubal ligation    . Cholecystectomy    . Abdominal hysterectomy    . Shoulder surgery Right 2014    History reviewed. No pertinent family history.  Social History History  Substance Use Topics  . Smoking status: Former Smoker -- 1.00 packs/day for 20 years  . Smokeless tobacco: Never Used  . Alcohol Use: Yes    Allergies  Allergen Reactions  . Geodon [Ziprasidone Hydrochloride] Other (See Comments)    Numbness ,sob, headaches, blurred vision  . Sulfa Antibiotics Hives  . Aripiprazole Rash  . Penicillins Rash    Current Outpatient Prescriptions  Medication Sig Dispense Refill  . acetaminophen (TYLENOL) 500 MG tablet Take 500 mg by mouth every 6 (six) hours as needed. For bad headache       . buPROPion (WELLBUTRIN SR) 200 MG 12 hr tablet Take 200 mg by mouth 2 (two) times daily.        Marland Kitchen gabapentin (NEURONTIN) 800 MG tablet Take 1.5 tablets (1,200 mg total) by mouth 3 (three) times daily.  90 tablet  0  . HYDROcodone-acetaminophen (NORCO/VICODIN) 5-325 MG per tablet       . levothyroxine (SYNTHROID, LEVOTHROID) 150 MCG tablet Take 1 tablet (150 mcg total) by mouth daily before breakfast.  30 tablet  0  . prazosin (MINIPRESS) 2 MG capsule Take 1 capsule (2 mg total) by mouth at bedtime.  30 capsule  0  . topiramate (TOPAMAX) 200  MG tablet Take 100 mg by mouth daily.       No current facility-administered medications for this visit.    Review of Systems Review of Systems  Constitutional: Negative.   Respiratory: Negative.   Cardiovascular: Negative.     Blood pressure 120/70, pulse 72, resp. rate 12, height 5\' 6"  (1.676 m), weight 217 lb (98.431 kg).  Physical Exam Physical Exam  Constitutional: She is oriented to person, place, and time. She appears well-developed and well-nourished.  Neck: Thyromegaly present. No mass present.  Musculoskeletal:       Arms: Lymphadenopathy:    She has no cervical adenopathy.  Neurological: She is alert and oriented to person, place, and time.  Skin: Skin is warm and dry.    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 Minimally symptomatic.    Plan   Discussed excision now vs later if it becomes more symptomatic. Advised thios is a benign finding. Pt feels comfortable with leaving it alone for now. Will call if it causes more pain or shows further enlargement. Patient to return as needed.        Alexis Christensen  05/11/2013, 3:19 PM

## 2013-05-11 NOTE — Patient Instructions (Signed)
Patient to return as needed. 

## 2013-07-16 HISTORY — PX: OTHER SURGICAL HISTORY: SHX169

## 2013-08-22 ENCOUNTER — Emergency Department: Payer: Self-pay | Admitting: Emergency Medicine

## 2013-10-05 ENCOUNTER — Emergency Department: Payer: Self-pay | Admitting: Emergency Medicine

## 2013-12-30 DIAGNOSIS — M25469 Effusion, unspecified knee: Secondary | ICD-10-CM

## 2013-12-30 DIAGNOSIS — Z96659 Presence of unspecified artificial knee joint: Secondary | ICD-10-CM | POA: Insufficient documentation

## 2013-12-30 DIAGNOSIS — M25569 Pain in unspecified knee: Secondary | ICD-10-CM | POA: Insufficient documentation

## 2013-12-30 HISTORY — DX: Effusion, unspecified knee: M25.469

## 2013-12-30 HISTORY — DX: Presence of unspecified artificial knee joint: Z96.659

## 2014-04-16 ENCOUNTER — Ambulatory Visit: Payer: Self-pay | Admitting: Internal Medicine

## 2014-05-05 DIAGNOSIS — M722 Plantar fascial fibromatosis: Secondary | ICD-10-CM | POA: Insufficient documentation

## 2014-05-17 ENCOUNTER — Encounter: Payer: Self-pay | Admitting: General Surgery

## 2014-05-27 ENCOUNTER — Emergency Department: Payer: Self-pay | Admitting: Emergency Medicine

## 2014-06-11 ENCOUNTER — Ambulatory Visit: Payer: Self-pay | Admitting: Nurse Practitioner

## 2014-07-12 ENCOUNTER — Emergency Department: Payer: Self-pay | Admitting: Emergency Medicine

## 2014-07-16 DIAGNOSIS — I82409 Acute embolism and thrombosis of unspecified deep veins of unspecified lower extremity: Secondary | ICD-10-CM

## 2014-07-16 DIAGNOSIS — I82401 Acute embolism and thrombosis of unspecified deep veins of right lower extremity: Secondary | ICD-10-CM

## 2014-07-16 HISTORY — DX: Acute embolism and thrombosis of unspecified deep veins of right lower extremity: I82.401

## 2014-07-16 HISTORY — DX: Acute embolism and thrombosis of unspecified deep veins of unspecified lower extremity: I82.409

## 2014-07-22 ENCOUNTER — Ambulatory Visit: Payer: Self-pay | Admitting: Pain Medicine

## 2014-08-02 ENCOUNTER — Ambulatory Visit: Payer: Self-pay | Admitting: Pain Medicine

## 2014-09-15 DIAGNOSIS — G5600 Carpal tunnel syndrome, unspecified upper limb: Secondary | ICD-10-CM | POA: Insufficient documentation

## 2014-09-15 DIAGNOSIS — M25512 Pain in left shoulder: Secondary | ICD-10-CM

## 2014-09-15 DIAGNOSIS — G8929 Other chronic pain: Secondary | ICD-10-CM | POA: Insufficient documentation

## 2014-09-15 DIAGNOSIS — M25519 Pain in unspecified shoulder: Secondary | ICD-10-CM | POA: Insufficient documentation

## 2014-09-15 HISTORY — DX: Carpal tunnel syndrome, unspecified upper limb: G56.00

## 2014-09-23 ENCOUNTER — Ambulatory Visit: Payer: Self-pay | Admitting: Otolaryngology

## 2014-10-24 ENCOUNTER — Emergency Department
Admit: 2014-10-24 | Disposition: A | Discharge status: Home / Self Care | Payer: Self-pay | Admitting: Emergency Medicine

## 2014-11-05 NOTE — Discharge Summary (Signed)
PATIENT NAME:  Alexis Christensen, Alexis Christensen MR#:  [id number] DATE OF BIRTH:  03/13/1971  DATE OF ADMISSION:  11/06/2012 DATE OF DISCHARGE: 11/09/2012   ADMITTING DIAGNOSIS: Right knee osteoarthritis.   DISCHARGE DIAGNOSIS: Right knee osteoarthritis.  OPERATION: On 11/06/2012, the patient had a right total knee replacement.   ANESTHESIA: Spinal.   SURGEON: Hessie Knows, M.D.   ASSISTANT: Reche Dixon, PA-C.   ESTIMATED BLOOD LOSS: 50 mL.  Tourniquet Time: 73 minutes at 300 mmHg.  IMPLANTS: A Medacta GMK primary total knee system, size 2 right fixed tibial component with a size 2, 12 mm UC tibial insert, a 3N right standard femoral component and a 2 resurfacing patella.   COMPLICATIONS: None. The patient was stabilized, brought to the recovery room, and then brought down to the orthopedic floor where she was treated for pain control and physical therapy.   HISTORY: The patient is a 44 year old female, who presented for significant pain involving her right knee. The patient has been refractory to conservative treatment and has had conservative management. The patient is having difficulty with activities of daily living. The patient has gone to physical therapy and had an MRI with continued symptoms.   PHYSICAL EXAMINATION:  GENERAL: Well-developed, well-nourished female with an antalgic component.  LUNGS: Clear to auscultation.  HEART: Regular rate and rhythm.  MUSCULOSKELETAL: In regard to the right lower extremity, the patient has pain involving the medial lateral joint line. There is retropatellar crepitus with discomfort. The patient has full flexion and full extension. The patient has neurovascular intact.   HOSPITAL COURSE: After initial admission on 11/06/2012, the patient was brought to the orthopedic floor. On postoperative day 1, the patient had a hemoglobin of 11.2, which remained stable there. The patient did have a wound VAC in place, which she did well with. The patient was noted to have  increased swelling involving her leg on 04/26 and an ultrasound was ordered, which showed negative for DVT. The patient did 230 feet with physical therapy and was ambulating up stairs on the day before discharge. The patient was ready to go home with home health physical therapy on 11/09/2012.   CONDITION AT DISCHARGE: Stable.   DISPOSITION: The patient was sent home with home health physical therapy.   DISCHARGE INSTRUCTIONS: The patient will follow up at [facility] in two weeks for staple removal. The patient will do weight bear as tolerated on the affected leg. The patient will elevate her right leg on 1 to 2 pillows and use thigh-high TED hose on both legs, removed at nighttime. The patient will resume her regular diet. The patient will use a Polar Care to decrease swelling, try not to get her dressing wet, try to keep it clean. The patient will call the clinic if there is any bright red bleeding, calf pain, bowel or bladder difficulty, or fever greater than 101.5. The patient will do physical therapy at home doing gait training and range of motion activities.   DISCHARGE MEDICATIONS: Resume home medications. She will add oxycodone 5 mg q. 4 hours for severe pain. The patient will use Xarelto 10 mg 1 tablet in the morning for 12 days and then discontinue.   ____________________________ J. Reche Dixon, [place] [email or web address]:aw D: 11/09/2012 06:32:35 ET T: 11/09/2012 06:49:47 ET JOB#: [id number]  cc: J. Reche Dixon, [place], <Dictator> J Zoie Sarin [facility] PA ELECTRONICALLY SIGNED 11/18/2012 7:28

## 2014-11-05 NOTE — Op Note (Signed)
PATIENT NAME:  Alexis Christensen, Alexis Christensen MR#:  [id number] DATE OF BIRTH:  22-Mar-1971  DATE OF PROCEDURE:  11/06/2012  PREOPERATIVE DIAGNOSIS: Right knee osteoarthritis.   POSTOPERATIVE DIAGNOSIS: Right knee osteoarthritis.   PROCEDURE: Right total knee replacement.   ANESTHESIA: Spinal.   SURGEON: Laurene Footman, MD   ASSISTANT: Reche Dixon, PA-C   DESCRIPTION OF PROCEDURE: The patient was brought to the Operating Room, and after adequate anesthesia was obtained the right leg was prepped and draped in the usual sterile fashion with a tourniquet applied to the upper thigh. After patient identification and timeout procedures were completed, the leg was exsanguinated with an Esmarch and the tourniquet was raised to 300 mmHg. A midline skin incision was made, followed by a medial parapatellar arthrotomy. Inspection revealed significant wear of the medial compartment, lateral tibial condyle and patellofemoral joint predominantly on the patellar side. The ACL was excised along with the patellar fat pad and cautery used to remove soft tissue overlying the bone for the application of the Medacta cutting guide.  The cutting guide was applied, pinned into place, and after checking to make sure it was the appropriate cut and alignment, the proximal tibia cut was carried out with excision of the proximal tibial fragment. The femur was approached in a similar fashion removing cartilage to allow for proper application of the cutting guide. After it was placed and pinned into place, the distal femoral cuts were made. A #3 cutting block was applied and anterior and posterior chamfer cuts carried out without notching.  The posterior horns of the menisci were excised at this time.  With a 2 tibia tray in the appropriate location based on the pin from the initial cutting guide, the tibial trial was placed with the center drill hole and V cut into the bone. The 3 femoral trial was placed, and with a 12 mm UC implant there was  excellent stability.  At this point, the notch cut was made for the trochlea and drill holes made for the distal femur. The patella was cut using the patellar cutting guide.  As there was significant central wear and the 3 drill holes made, this measured a size 2. At this point, the trials were all removed and the bony surfaces thoroughly irrigated and dried. Exparel local anesthetic was then infiltrated along with saline around the joint and the synovium as well as the periarticular tissue to help aid in postop analgesia.  The components were then cemented into place with the tibia first, followed by the tibial polyethylene component. The femur was then placed in flexion and the femoral component impacted with excess cement removed. The knee was held in extension as the patellar button was clamped into place and all components cemented. After the cement had set, the knee was checked and excess cement removed. The knee was thoroughly irrigated and the tourniquet let down. There was no significant bleeding at the close of the case. The arthrotomy was then repaired using a heavy quill suture and a 2-0 quill subcutaneously, skin staples, Mepitel and a wound VAC, followed by an Ace wrap, Polar Care and knee immobilizer. The patient was sent to the recovery room in stable condition.   ESTIMATED BLOOD LOSS: 50 mL.   TOURNIQUET TIME: 73 minutes at 300 mmHg.  IMPLANTS: Medacta GMK Primary Total Knee System, size 2 right fixed tibial component with a size 2, 12 mm UC tibial insert, a 3N right standard femoral component and a 2 resurfacing patella.   SPECIMENS:  Cut ends of bone.   COMPLICATIONS: None.   CONDITION TO RECOVERY ROOM: Stable.   ____________________________ Laurene Footman, MD [email or web address]:cb D: 11/06/2012 16:06:58 ET T: 11/06/2012 20:02:25 ET JOB#: [id number]  cc: Laurene Footman, MD, <Dictator> Laurene Footman MD ELECTRONICALLY SIGNED 11/06/2012 21:10

## 2014-11-05 NOTE — Op Note (Signed)
PATIENT NAME:  Alexis Christensen, FLETCHALL MR#:  [id number] DATE OF BIRTH:  07-Apr-1971  DATE OF PROCEDURE:  12/04/2012  PREOPERATIVE DIAGNOSIS:  Arthrofibrosis, right total knee.  POSTOPERATIVE DIAGNOSIS:  Arthrofibrosis, right total knee.   PROCEDURE:  Manipulation, right total knee.   ANESTHESIA:  General.   SURGEON:  Laurene Footman, M.D.   DESCRIPTION OF PROCEDURE:  The patient was brought to the operating room and after adequate anesthesia was obtained, appropriate patient identification and timeout procedures were completed.  Initial exam revealed passive range of motion of 5 to 70 degrees range of motion.  Gentle pressure was applied with the hip in flexion to the proximal tibia.  Palpable and audible popping of adhesions was noted and flexion could be brought back to 120 degrees.  The knee was then placed in extension and full extension obtained.  The patient was then woken up and sent to recovery room.  There were no complications.  No specimen.  No blood loss.     ____________________________ Laurene Footman, MD [email or web address]:ea D: 12/04/2012 21:26:45 ET T: 12/05/2012 00:22:40 ET JOB#: [id number]  cc: Laurene Footman, MD, <Dictator> Christian Mate [facility] MD ELECTRONICALLY SIGNED 12/05/2012 8:20

## 2014-11-08 LAB — SURGICAL PATHOLOGY

## 2014-11-12 ENCOUNTER — Emergency Department
Admit: 2014-11-12 | Disposition: A | Discharge status: Home / Self Care | Payer: Self-pay | Admitting: Emergency Medicine

## 2014-11-14 NOTE — Op Note (Signed)
PATIENT NAME:  Alexis Christensen, Alexis Christensen MR#:  [id number] DATE OF BIRTH:  July 21, 1970  DATE OF PROCEDURE:  09/23/2014  PREOPERATIVE DIAGNOSES: Right submental mass.   POSTOPERATIVE DIAGNOSIS: Right submental mass.  PROCEDURE PERFORMED: Excision of a right submental deep neck mass consistent with probable lymph node.   ANESTHESIA: General endotracheal anesthesia.   ESTIMATED BLOOD LOSS: Less than 10 mL.   INTRAVENOUS FLUIDS: Please see anesthesia record.   COMPLICATIONS: None.   DRAINS AND STENT PLACEMENT: None.   SPECIMENS: Right submental lymph node surrounding fat conglomeration.   COMPLICATIONS: None.   INDICATIONS FOR PROCEDURE: The patient is a 44 year old female with a history of a right-sided submental lymph node and mass that intermittently changes in size. It is intermittently tender and has been resistant to medical management.   OPERATIVE FINDINGS: Right-sided lymph node deep to the platysma muscle. Second smaller lymph node excised in a level 1 packet of fat and lymph nodes.   DESCRIPTION OF PROCEDURE: The patient was identified in holding, benefits and risks of this procedure were discussed and consent was reviewed. The patient taken to the Operating Room and placed in the supine position. General endotracheal anesthesia was induced. The patient's anterior neck was prepped and draped in a sterile fashion after injection of 6 mL of 1% lidocaine 1:100,000 epinephrine. At this time, using a previously marked anterior neck crease, a midline incision was made with a 15 blade scalpel. Dissection was carried down to the level of the subcutaneous tissues and fat to the level of the platysma. This was divided along the median raphe. There was a slight diastases at this point. Dissection was carried inferiorly to the platysma. This demonstrated a conglomeration of some fat, as well as lymph nodes in the midline submental lymph node packet. At this time, using a combination of blunt and sharp  techniques using bipolar electrocautery, a large level 1 lymph node and adjacent smaller lymph nodes and fat were removed from the midline and just right of midline submental region. At this time, the lymph node was passed off the tablet for fresh evaluation for a lymphoma workup. The patient's anterior neck was copiously irrigated with sterile saline. Meticulous hemostasis was ensured. The patient's platysma was reapproximated in the midline and then the subcutaneous tissues were reapproximated using 3 Vicryl interrupted 4-0 Vicryl sutures. At this time, the skin was closed with Dermabond skin adhesive and topped with a Steri-Strip. Care of the patient at this time was transferred to anesthesia.    ____________________________ Jerene Bears, MD ccv:at D: 09/23/2014 08:25:02 ET T: 09/23/2014 17:14:11 ET JOB#: [id number]  cc: Jerene Bears, MD, <Dictator> Jerene Bears MD ELECTRONICALLY SIGNED 10/06/2014 9:57

## 2014-12-16 ENCOUNTER — Emergency Department
Admission: EM | Admit: 2014-12-16 | Discharge: 2014-12-16 | Disposition: A | Discharge status: Home / Self Care | Payer: Medicare Other | Attending: Emergency Medicine | Admitting: Emergency Medicine

## 2014-12-16 DIAGNOSIS — Z880 Allergy status to penicillin: Secondary | ICD-10-CM | POA: Diagnosis not present

## 2014-12-16 DIAGNOSIS — R21 Rash and other nonspecific skin eruption: Secondary | ICD-10-CM | POA: Diagnosis present

## 2014-12-16 DIAGNOSIS — Z87891 Personal history of nicotine dependence: Secondary | ICD-10-CM | POA: Insufficient documentation

## 2014-12-16 DIAGNOSIS — Z79899 Other long term (current) drug therapy: Secondary | ICD-10-CM | POA: Diagnosis not present

## 2014-12-16 DIAGNOSIS — L259 Unspecified contact dermatitis, unspecified cause: Secondary | ICD-10-CM | POA: Diagnosis not present

## 2014-12-16 MED ORDER — PREDNISONE 20 MG PO TABS: 40.0000 mg | ORAL_TABLET | Freq: Every day | ORAL | Status: AC

## 2014-12-16 NOTE — ED Notes (Signed)
Patient ambulatory to triage with steady gait, without difficulty or distress noted; pt reports generalized itchy rash since Monday

## 2014-12-16 NOTE — ED Provider Notes (Signed)
CSN: [id number]     Arrival date & time 12/16/14  0620 History   None    Chief Complaint  Patient presents with  . Rash   HPI Comments: 44 year old female presents today complaining of rash all over her body. She reports that she has a chronic rash to her forearm that she takes hydroxyzine for and uses a steroid cream. These have not provided any relief of her symptoms. She has not taken any new soaps, lotions or creams. She has also not started any new medications. Someone stayed with her about a week ago and had similar rash.  Patient is a 44 y.o. female presenting with rash. The history is provided by the patient.  Rash Location:  Full body Quality: itchiness and redness   Severity:  Moderate Onset quality:  Gradual Duration:  3 days Timing:  Constant Progression:  Worsening Chronicity:  New Context: exposure to similar rash   Context: not food, not insect bite/sting, not medications, not sick contacts and not sun exposure   Relieved by:  Nothing Ineffective treatments:  Anti-itch cream (Atarax) Associated symptoms: no fever, no joint pain, no myalgias, no periorbital edema, no shortness of breath, no throat swelling and no tongue swelling     Past Medical History  Diagnosis Date  . Bipolar 1 disorder   . Thyroid disease   . Post traumatic stress disorder (PTSD)   . Anxiety   . Depression   . WNIOEVOJ(500.9)    Past Surgical History  Procedure Laterality Date  . Wrist surgery    . Ankle surgery    . Knee arthroscopy    . Tubal ligation    . Cholecystectomy    . Abdominal hysterectomy    . Shoulder surgery Right 2014   No family history on file. History  Substance Use Topics  . Smoking status: Former Smoker -- 1.00 packs/day for 20 years  . Smokeless tobacco: Never Used  . Alcohol Use: Yes   OB History    Gravida Para Term Preterm AB TAB SAB Ectopic Multiple Living   4 2   2  2   2       Obstetric Comments   1st Menstrual Cycle:  10 1st Pregnancy:  24      Review of Systems  Constitutional: Negative for fever and chills.  Respiratory: Negative for shortness of breath.   Musculoskeletal: Negative for myalgias and arthralgias.  Skin: Positive for rash.  All other systems reviewed and are negative.     Allergies  Geodon; Sulfa antibiotics; Aripiprazole; and Penicillins  Home Medications   Prior to Admission medications   Medication Sig Start Date End Date Taking? Authorizing Provider  acetaminophen (TYLENOL) 500 MG tablet Take 500 mg by mouth every 6 (six) hours as needed. For bad headache     Historical Provider, MD  buPROPion (WELLBUTRIN SR) 200 MG 12 hr tablet Take 200 mg by mouth 2 (two) times daily.      Historical Provider, MD  gabapentin (NEURONTIN) 800 MG tablet Take 1.5 tablets (1,200 mg total) by mouth 3 (three) times daily. 06/27/11   Darrol Jump, MD  HYDROcodone-acetaminophen (NORCO/VICODIN) 5-325 MG per tablet  04/09/13   Historical Provider, MD  levothyroxine (SYNTHROID, LEVOTHROID) 150 MCG tablet Take 1 tablet (150 mcg total) by mouth daily before breakfast. 06/27/11 05/11/13  Darrol Jump, MD  prazosin (MINIPRESS) 2 MG capsule Take 1 capsule (2 mg total) by mouth at bedtime. 06/27/11 05/11/13  Darrol Jump, MD  predniSONE (  DELTASONE) 20 MG tablet Take 2 tablets (40 mg total) by mouth daily. 12/16/14 12/21/14  Shayne Alken V, PA-C  topiramate (TOPAMAX) 200 MG tablet Take 100 mg by mouth daily. 06/27/11 05/11/13  Darrol Jump, MD   BP 139/90 mmHg  Pulse 67  Temp(Src) 98 F (36.7 C) (Oral)  Resp 18  Ht 5\' 7"  (1.702 m)  Wt 250 lb (113.399 kg)  BMI 39.15 kg/m2  SpO2 98% Physical Exam  Constitutional: She is oriented to person, place, and time. Vital signs are normal. She appears well-developed and well-nourished.  Non-toxic appearance. She does not have a sickly appearance.  Obese  HENT:  Head: Normocephalic and atraumatic.  Neck: Normal range of motion.  Musculoskeletal: Normal range of motion.  Orthopedic boot  on left foot  Neurological: She is alert and oriented to person, place, and time.  Skin: Skin is warm and dry. Rash noted. Rash is maculopapular.  Diffuse maculopapular rash to arms, legs and trunk  Psychiatric: She has a normal mood and affect. Her behavior is normal. Judgment and thought content normal.  Nursing note and vitals reviewed.   ED Course  Procedures (including critical care time) Labs Review Labs Reviewed - No data to display  Imaging Review No results found.   EKG Interpretation None      MDM  Patient appears to have chronic urticaria. Likely exacerbated by an unknown exposure. Five-day course of prednisone. Advised to continue Atarax in the evenings. Take Claritin or Pepcid over the counter in the mornings. Final diagnoses:  Contact dermatitis       Corliss Parish, PA-C 12/16/14 4503  Harvest Dark, MD 12/16/14 [phone]

## 2015-01-01 ENCOUNTER — Emergency Department
Admission: EM | Admit: 2015-01-01 | Discharge: 2015-01-01 | Disposition: A | Discharge status: Home / Self Care | Payer: Medicare Other | Attending: Emergency Medicine | Admitting: Emergency Medicine

## 2015-01-01 ENCOUNTER — Encounter: Payer: Self-pay | Admitting: Emergency Medicine

## 2015-01-01 DIAGNOSIS — M62830 Muscle spasm of back: Secondary | ICD-10-CM

## 2015-01-01 DIAGNOSIS — M62838 Other muscle spasm: Secondary | ICD-10-CM | POA: Diagnosis not present

## 2015-01-01 DIAGNOSIS — Z79899 Other long term (current) drug therapy: Secondary | ICD-10-CM | POA: Diagnosis not present

## 2015-01-01 DIAGNOSIS — Z880 Allergy status to penicillin: Secondary | ICD-10-CM | POA: Insufficient documentation

## 2015-01-01 DIAGNOSIS — Z87891 Personal history of nicotine dependence: Secondary | ICD-10-CM | POA: Diagnosis not present

## 2015-01-01 DIAGNOSIS — M549 Dorsalgia, unspecified: Secondary | ICD-10-CM | POA: Diagnosis present

## 2015-01-01 MED ORDER — DIAZEPAM 5 MG/ML IJ SOLN
INTRAMUSCULAR | Status: AC
Start: 2015-01-01 — End: 2015-01-01
  Administered 2015-01-01: 10 mg via INTRAMUSCULAR
  Filled 2015-01-01: qty 2

## 2015-01-01 MED ORDER — KETOROLAC TROMETHAMINE 60 MG/2ML IM SOLN
INTRAMUSCULAR | Status: AC
  Administered 2015-01-01: 60 mg via INTRAMUSCULAR
  Filled 2015-01-01: qty 2

## 2015-01-01 MED ORDER — KETOROLAC TROMETHAMINE 10 MG PO TABS: 10.0000 mg | ORAL_TABLET | Freq: Four times a day (QID) | ORAL | Status: DC | PRN

## 2015-01-01 MED ORDER — DIAZEPAM 5 MG/ML IJ SOLN
10.0000 mg | Freq: Once | INTRAMUSCULAR | Status: AC
  Administered 2015-01-01: 10 mg via INTRAMUSCULAR

## 2015-01-01 MED ORDER — KETOROLAC TROMETHAMINE 60 MG/2ML IM SOLN
60.0000 mg | Freq: Once | INTRAMUSCULAR | Status: AC
  Administered 2015-01-01: 60 mg via INTRAMUSCULAR

## 2015-01-01 MED ORDER — METHOCARBAMOL 750 MG PO TABS: 750.0000 mg | ORAL_TABLET | Freq: Four times a day (QID) | ORAL | Status: DC | PRN

## 2015-01-01 NOTE — ED Notes (Signed)
Patient to ER for c/o neck pain upon waking. States neck is more painful with trying to turn head. Denies any known injury.

## 2015-01-01 NOTE — ED Provider Notes (Signed)
[facility] Emergency Department Provider Note  ____________________________________________  Time seen: Approximately 1500  I have reviewed the triage vital signs and the nursing notes.   HISTORY  Chief Complaint Torticollis    HPI Alexis Christensen is a 44 y.o. female comes here today complaining of neck and back pain upon waking says she believes she slept wrong is having pain with rotating her head denies any trauma rates pain as about 8 out of 10 tight spasm pain nothing making it particularly better or worse and has no other complaints at this time   Past Medical History  Diagnosis Date  . Bipolar 1 disorder   . Thyroid disease   . Post traumatic stress disorder (PTSD)   . Anxiety   . Depression   . IHKVQQVZ(563.8)     Patient Active Problem List   Diagnosis Date Noted  . Lipoma of arm 05/11/2013    Past Surgical History  Procedure Laterality Date  . Wrist surgery    . Ankle surgery    . Knee arthroscopy    . Tubal ligation    . Cholecystectomy    . Abdominal hysterectomy    . Shoulder surgery Right 2014  . Replacement total knee Right     Current Outpatient Rx  Name  Route  Sig  Dispense  Refill  . acetaminophen (TYLENOL) 500 MG tablet   Oral   Take 500 mg by mouth every 6 (six) hours as needed. For bad headache          . buPROPion (WELLBUTRIN SR) 200 MG 12 hr tablet   Oral   Take 200 mg by mouth 2 (two) times daily.           Marland Kitchen gabapentin (NEURONTIN) 800 MG tablet   Oral   Take 1.5 tablets (1,200 mg total) by mouth 3 (three) times daily.   90 tablet   0   . HYDROcodone-acetaminophen (NORCO/VICODIN) 5-325 MG per tablet               . ketorolac (TORADOL) 10 MG tablet   Oral   Take 1 tablet (10 mg total) by mouth every 6 (six) hours as needed.   20 tablet   0   . EXPIRED: levothyroxine (SYNTHROID, LEVOTHROID) 150 MCG tablet   Oral   Take 1 tablet (150 mcg total) by mouth daily before breakfast.   30 tablet  0   . methocarbamol (ROBAXIN-750) 750 MG tablet   Oral   Take 1 tablet (750 mg total) by mouth every 6 (six) hours as needed for muscle spasms.   16 tablet   0   . EXPIRED: prazosin (MINIPRESS) 2 MG capsule   Oral   Take 1 capsule (2 mg total) by mouth at bedtime.   30 capsule   0   . EXPIRED: topiramate (TOPAMAX) 200 MG tablet   Oral   Take 100 mg by mouth daily.           Allergies Geodon; Sulfa antibiotics; Aripiprazole; and Penicillins  No family history on file.  Social History History  Substance Use Topics  . Smoking status: Former Smoker -- 1.00 packs/day for 20 years  . Smokeless tobacco: Never Used  . Alcohol Use: No    Review of Systems Constitutional: No fever/chills Eyes: No visual changes. ENT: No sore throat. Cardiovascular: Denies chest pain. Respiratory: Denies shortness of breath. Gastrointestinal: No abdominal pain.  No nausea, no vomiting.  No diarrhea.  No constipation. Genitourinary: Negative  for dysuria. Musculoskeletal: Negative for back pain. Skin: Negative for rash. Neurological: Negative for headaches, focal weakness or numbness.  10-point ROS otherwise negative.  ____________________________________________   PHYSICAL EXAM:  VITAL SIGNS: ED Triage Vitals  Enc Vitals Group     BP 01/01/15 1407 122/76 mmHg     Pulse Rate 01/01/15 1407 69     Resp 01/01/15 1407 18     Temp 01/01/15 1407 98 F (36.7 C)     Temp Source 01/01/15 1407 Oral     SpO2 01/01/15 1407 96 %     Weight 01/01/15 1407 250 lb (113.399 kg)     Height 01/01/15 1407 5\' 7"  (1.702 m)     Head Cir --      Peak Flow --      Pain Score 01/01/15 1408 8     Pain Loc --      Pain Edu? --      Excl. in [place]? --     Constitutional: Alert and oriented. Well appearing and in no acute distress. Eyes: Conjunctivae are normal. PERRL. EOMI. Head: Atraumatic. Nose: No congestion/rhinnorhea. Mouth/Throat: Mucous membranes are moist.  Oropharynx non-erythematous. Neck:  No stridor.  Tight spasm muscles along the right side of her neck Cardiovascular: Normal rate, regular rhythm. Grossly normal heart sounds.  Good peripheral circulation. Respiratory: Normal respiratory effort.  No retractions. Lungs CTAB. Musculoskeletal: No lower extremity tenderness nor edema.  No joint effusions. Neurologic:  Normal speech and language. No gross focal neurologic deficits are appreciated. Speech is normal. No gait instability. Skin:  Skin is warm, dry and intact. No rash noted. Psychiatric: Mood and affect are normal. Speech and behavior are normal.  ____________________________________________     PROCEDURES  Procedure(s) performed: None  Critical Care performed: No  ____________________________________________   INITIAL IMPRESSION / ASSESSMENT AND PLAN / ED COURSE  Pertinent labs & imaging results that were available during my care of the patient were reviewed by me and considered in my medical decision making (see chart for details).  Initial impression on this patient is muscle spasm along the department should she shots of Valium and Toradol going to discharge home prescriptions for muscle relaxers anti-inflammatory is have her follow-up in 2-3 days if not improving with her doctor orthopedics return here for any acute concerns or worsening symptoms ____________________________________________   FINAL CLINICAL IMPRESSION(S) / ED DIAGNOSES  Final diagnoses:  Muscle spasm of back  Muscle spasms of neck     Jeroline Wolbert Verdene Rio, PA-C 01/01/15 [place], MD 01/02/15 0013

## 2015-02-21 DIAGNOSIS — D649 Anemia, unspecified: Secondary | ICD-10-CM | POA: Insufficient documentation

## 2015-03-04 DIAGNOSIS — I82409 Acute embolism and thrombosis of unspecified deep veins of unspecified lower extremity: Secondary | ICD-10-CM

## 2015-03-04 HISTORY — DX: Acute embolism and thrombosis of unspecified deep veins of unspecified lower extremity: I82.409

## 2015-03-27 ENCOUNTER — Emergency Department
Admission: EM | Admit: 2015-03-27 | Discharge: 2015-03-27 | Disposition: A | Discharge status: Home / Self Care | Payer: Medicare Other | Attending: Emergency Medicine | Admitting: Emergency Medicine

## 2015-03-27 ENCOUNTER — Encounter: Payer: Self-pay | Admitting: Emergency Medicine

## 2015-03-27 DIAGNOSIS — Z79891 Long term (current) use of opiate analgesic: Secondary | ICD-10-CM | POA: Insufficient documentation

## 2015-03-27 DIAGNOSIS — Z87891 Personal history of nicotine dependence: Secondary | ICD-10-CM | POA: Diagnosis not present

## 2015-03-27 DIAGNOSIS — Z880 Allergy status to penicillin: Secondary | ICD-10-CM | POA: Diagnosis not present

## 2015-03-27 DIAGNOSIS — Z79899 Other long term (current) drug therapy: Secondary | ICD-10-CM | POA: Insufficient documentation

## 2015-03-27 DIAGNOSIS — H109 Unspecified conjunctivitis: Secondary | ICD-10-CM | POA: Insufficient documentation

## 2015-03-27 DIAGNOSIS — H578 Other specified disorders of eye and adnexa: Secondary | ICD-10-CM | POA: Diagnosis present

## 2015-03-27 MED ORDER — POLYMYXIN B-TRIMETHOPRIM 10000-0.1 UNIT/ML-% OP SOLN: 2.0000 [drp] | OPHTHALMIC | Status: DC

## 2015-03-27 NOTE — ED Notes (Signed)
Pain, burning, drainage and redness to right eye that started today.

## 2015-03-27 NOTE — ED Notes (Signed)
NAD noted at time of D/C. Pt denies questions or concerns. Pt ambulatory to the lobby at this time.  

## 2015-03-27 NOTE — Discharge Instructions (Signed)

## 2015-03-27 NOTE — ED Provider Notes (Signed)
[facility] Emergency Department Provider Note  ____________________________________________  Time seen: Approximately 5:58 PM  I have reviewed the triage vital signs and the nursing notes.   HISTORY  Chief Complaint Conjunctivitis    HPI Alexis Christensen is a 44 y.o. female since for evaluation of right eye redness drainage and stuck together today. Onset 1 day   Past Medical History  Diagnosis Date  . Bipolar 1 disorder   . Thyroid disease   . Post traumatic stress disorder (PTSD)   . Anxiety   . Depression   . PYKDXIPJ(825.0)     Patient Active Problem List   Diagnosis Date Noted  . Lipoma of arm 05/11/2013    Past Surgical History  Procedure Laterality Date  . Wrist surgery    . Ankle surgery    . Knee arthroscopy    . Tubal ligation    . Cholecystectomy    . Abdominal hysterectomy    . Shoulder surgery Right 2014  . Replacement total knee Right     Current Outpatient Rx  Name  Route  Sig  Dispense  Refill  . acetaminophen (TYLENOL) 500 MG tablet   Oral   Take 500 mg by mouth every 6 (six) hours as needed. For bad headache          . buPROPion (WELLBUTRIN SR) 200 MG 12 hr tablet   Oral   Take 200 mg by mouth 2 (two) times daily.           Marland Kitchen gabapentin (NEURONTIN) 800 MG tablet   Oral   Take 1.5 tablets (1,200 mg total) by mouth 3 (three) times daily.   90 tablet   0   . HYDROcodone-acetaminophen (NORCO/VICODIN) 5-325 MG per tablet               . ketorolac (TORADOL) 10 MG tablet   Oral   Take 1 tablet (10 mg total) by mouth every 6 (six) hours as needed.   20 tablet   0   . EXPIRED: levothyroxine (SYNTHROID, LEVOTHROID) 150 MCG tablet   Oral   Take 1 tablet (150 mcg total) by mouth daily before breakfast.   30 tablet   0   . methocarbamol (ROBAXIN-750) 750 MG tablet   Oral   Take 1 tablet (750 mg total) by mouth every 6 (six) hours as needed for muscle spasms.   16 tablet   0   . EXPIRED: prazosin  (MINIPRESS) 2 MG capsule   Oral   Take 1 capsule (2 mg total) by mouth at bedtime.   30 capsule   0   . EXPIRED: topiramate (TOPAMAX) 200 MG tablet   Oral   Take 100 mg by mouth daily.         Marland Kitchen trimethoprim-polymyxin b (POLYTRIM) ophthalmic solution   Right Eye   Place 2 drops into the right eye every 4 (four) hours.   10 mL   0     Allergies Geodon; Sulfa antibiotics; Aripiprazole; Erythromycin; and Penicillins  No family history on file.  Social History Social History  Substance Use Topics  . Smoking status: Former Smoker -- 1.00 packs/day for 20 years  . Smokeless tobacco: Never Used  . Alcohol Use: No    Review of Systems Constitutional: No fever/chills Eyes: No visual changes. Positive for right eye redness. ENT: No sore throat. Cardiovascular: Denies chest pain. Respiratory: Denies shortness of breath. Gastrointestinal: No abdominal pain.  No nausea, no vomiting.  No diarrhea.  No constipation. Genitourinary: Negative for dysuria. Musculoskeletal: Negative for back pain. Skin: Negative for rash. Neurological: Negative for headaches, focal weakness or numbness.  10-point ROS otherwise negative.  ____________________________________________   PHYSICAL EXAM:  VITAL SIGNS: ED Triage Vitals  Enc Vitals Group     BP 03/27/15 1720 111/70 mmHg     Pulse Rate 03/27/15 1720 78     Resp 03/27/15 1720 18     Temp 03/27/15 1720 98.4 F (36.9 C)     Temp Source 03/27/15 1720 Oral     SpO2 03/27/15 1720 95 %     Weight 03/27/15 1720 250 lb (113.399 kg)     Height 03/27/15 1720 5\' 6"  (1.676 m)     Head Cir --      Peak Flow --      Pain Score 03/27/15 1723 7     Pain Loc --      Pain Edu? --      Excl. in [place]? --     Constitutional: Alert and oriented. Well appearing and in no acute distress. Eyes: Right conjunctiva erythematous with purulent drainage noted. PERRL. EOMI. Head: Atraumatic. Nose: No congestion/rhinnorhea. Mouth/Throat: Mucous membranes  are moist.  Oropharynx non-erythematous. Neck: No stridor.   Cardiovascular: Normal rate, regular rhythm. Grossly normal heart sounds.  Good peripheral circulation. Respiratory: Normal respiratory effort.  No retractions. Lungs CTAB. Gastrointestinal: Soft and nontender. No distention. No abdominal bruits. No CVA tenderness. Musculoskeletal: No lower extremity tenderness nor edema.  No joint effusions. Neurologic:  Normal speech and language. No gross focal neurologic deficits are appreciated. No gait instability. Skin:  Skin is warm, dry and intact. No rash noted. Psychiatric: Mood and affect are normal. Speech and behavior are normal.  ____________________________________________   LABS (all labs ordered are listed, but only abnormal results are displayed)  Labs Reviewed - No data to display    PROCEDURES  Procedure(s) performed: None  Critical Care performed: No  ____________________________________________   INITIAL IMPRESSION / ASSESSMENT AND PLAN / ED COURSE  Pertinent labs & imaging results that were available during my care of the patient were reviewed by me and considered in my medical decision making (see chart for details).  Conjunctivitis. Rx given for Polytrim eyedrops. Patient follow-up with PCP or return to the ER with any worsening symptomology. She voices no other emergency medical complaints at this time. ____________________________________________   FINAL CLINICAL IMPRESSION(S) / ED DIAGNOSES  Final diagnoses:  Conjunctivitis of right eye      Arlyss Repress, PA-C 03/27/15 1803  Delman Kitten, MD 03/27/15 2150

## 2015-04-28 DIAGNOSIS — J309 Allergic rhinitis, unspecified: Secondary | ICD-10-CM | POA: Insufficient documentation

## 2015-05-09 ENCOUNTER — Other Ambulatory Visit: Payer: Self-pay | Admitting: Nurse Practitioner

## 2015-05-09 DIAGNOSIS — Z1231 Encounter for screening mammogram for malignant neoplasm of breast: Secondary | ICD-10-CM

## 2015-05-10 ENCOUNTER — Encounter: Payer: Self-pay | Admitting: *Deleted

## 2015-05-18 ENCOUNTER — Ambulatory Visit (INDEPENDENT_AMBULATORY_CARE_PROVIDER_SITE_OTHER): Payer: Medicare Other | Admitting: Urology

## 2015-05-18 ENCOUNTER — Encounter: Payer: Self-pay | Admitting: Urology

## 2015-05-18 VITALS — BP 106/70 | HR 57 | Ht 64.0 in | Wt 252.3 lb

## 2015-05-18 DIAGNOSIS — N393 Stress incontinence (female) (male): Secondary | ICD-10-CM | POA: Insufficient documentation

## 2015-05-18 LAB — URINALYSIS, COMPLETE
BILIRUBIN UA: NEGATIVE
Glucose, UA: NEGATIVE
KETONES UA: NEGATIVE
LEUKOCYTES UA: NEGATIVE
NITRITE UA: NEGATIVE
PROTEIN UA: NEGATIVE
RBC UA: NEGATIVE
UUROB: 0.2 mg/dL (ref 0.2–1.0)
pH, UA: 5.5 (ref 5.0–7.5)

## 2015-05-18 LAB — MICROSCOPIC EXAMINATION
RBC, UA: NONE SEEN /hpf (ref 0–?)
Renal Epithel, UA: NONE SEEN /hpf
WBC, UA: NONE SEEN /hpf (ref 0–?)

## 2015-05-18 LAB — BLADDER SCAN AMB NON-IMAGING: SCAN RESULT: 15

## 2015-05-18 NOTE — Progress Notes (Signed)
05/18/2015 7:12 PM   Alexis Christensen 25-Nov-1970 [id number]  Referring provider: Perrin Maltese, MD [address] [place], [place] [address]  Chief Complaint  Patient presents with  . Over Active Bladder  . Stress Incontinence    HPI: Patient is a 89 show white female who presents today complaining of worsening of her urinary incontinence.  She states that a few months ago she was diagnosed with URI and had severe coughing spells.  She is now having stress incontinence causing her to soil her underwear. She is wearing up to 4 pads daily.  She also has urinary incontinence when she stands up to walk to the bathroom.  She states she is able to postpone urination to a point when she feels the urge to urinate.    Patient is also with frequent urination, urgency, nocturia and hesitancy.  She is not having dysuria, suprapubic pain or gross hematuria.  She is wondering if she is a candidate for a urethral sling procedure.   PMH: Past Medical History  Diagnosis Date  . Bipolar 1 disorder ([place])   . Thyroid disease   . Post traumatic stress disorder (PTSD)   . Anxiety   . Depression   . Headache(784.0)   . Stress incontinence   . Urge incontinence   . Heart burn   . Arthritis   . Asthma   . DVT (deep venous thrombosis) ([place])   . Heart murmur   . HLD (hyperlipidemia)   . Adopted   . History of kidney stones     Surgical History: Past Surgical History  Procedure Laterality Date  . Wrist surgery    . Ankle surgery    . Knee arthroscopy    . Tubal ligation    . Cholecystectomy    . Abdominal hysterectomy    . Shoulder surgery Right 2014  . Replacement total knee Right   . Lymph node removal  2015    Neck    Home Medications:    Medication List       This list is accurate as of: 05/18/15  7:12 PM.  Always use your most recent med list.               acetaminophen 500 MG tablet  Commonly known as:  TYLENOL  Take 500 mg by mouth every 6 (six) hours as needed. For bad  headache     benzonatate 100 MG capsule  Commonly known as:  TESSALON  TK 1 C PO Q 8 H PRF COUGH     buPROPion 200 MG 12 hr tablet  Commonly known as:  WELLBUTRIN SR  Take 200 mg by mouth 2 (two) times daily.     busPIRone 15 MG tablet  Commonly known as:  BUSPAR  Take 15 mg by mouth.     cyclobenzaprine 10 MG tablet  Commonly known as:  FLEXERIL  once a day     dicyclomine 10 MG capsule  Commonly known as:  BENTYL  TK ONE C PO Q 8 H PRF ABDOMINAL PAIN     doxepin 50 MG capsule  Commonly known as:  SINEQUAN     EPIPEN 2-PAK 0.3 mg/0.3 mL Soaj injection  Generic drug:  EPINEPHrine     fluticasone 50 MCG/ACT nasal spray  Commonly known as:  FLONASE     furosemide 20 MG tablet  Commonly known as:  LASIX  Take by mouth.     furosemide 40 MG tablet  Commonly known as:  LASIX  TK 1 T PO QAM     gabapentin 800 MG tablet  Commonly known as:  NEURONTIN  Take 1.5 tablets (1,200 mg total) by mouth 3 (three) times daily.     HYDROcodone-acetaminophen 5-325 MG tablet  Commonly known as:  NORCO/VICODIN     hydrOXYzine 25 MG tablet  Commonly known as:  ATARAX/VISTARIL  Take by mouth.     ketoconazole 2 % cream  Commonly known as:  NIZORAL  Apply topically.     ketorolac 10 MG tablet  Commonly known as:  TORADOL  Take 1 tablet (10 mg total) by mouth every 6 (six) hours as needed.     levocetirizine 5 MG tablet  Commonly known as:  XYZAL  Take 5 mg by mouth.     levothyroxine 300 MCG tablet  Commonly known as:  SYNTHROID, LEVOTHROID  Take 600 mcg by mouth daily before breakfast.     levothyroxine 150 MCG tablet  Commonly known as:  SYNTHROID, LEVOTHROID  Take 1 tablet (150 mcg total) by mouth daily before breakfast.     methocarbamol 750 MG tablet  Commonly known as:  ROBAXIN-750  Take 1 tablet (750 mg total) by mouth every 6 (six) hours as needed for muscle spasms.     metroNIDAZOLE 500 MG tablet  Commonly known as:  FLAGYL  TK 1 T PO BID FOR 7 DAYS      omeprazole 20 MG capsule  Commonly known as:  PRILOSEC  Take 20 mg by mouth.     PATADAY 0.2 % Soln  Generic drug:  Olopatadine HCl     potassium chloride SA 20 MEQ tablet  Commonly known as:  K-DUR,KLOR-CON  Take by mouth.     prazosin 2 MG capsule  Commonly known as:  MINIPRESS  Take 1 capsule (2 mg total) by mouth at bedtime.     PROAIR HFA 108 (90 BASE) MCG/ACT inhaler  Generic drug:  albuterol  INHALE 1 PUFF PO Q 6 H PRF SOB OR WHEEZING     QUEtiapine 300 MG tablet  Commonly known as:  SEROQUEL  TK 1 T PO QHS     ranitidine 150 MG capsule  Commonly known as:  ZANTAC     rivaroxaban 20 MG Tabs tablet  Commonly known as:  XARELTO  Take 20 mg by mouth daily with supper.     simvastatin 20 MG tablet  Commonly known as:  ZOCOR  1 every evening     SUBOXONE 8-2 MG Film  Generic drug:  Buprenorphine HCl-Naloxone HCl  DOSSOLVE 1 FILM UNDER THE TONGUE QD FOR 7 DAYS     SUMAtriptan 50 MG tablet  Commonly known as:  IMITREX     tiZANidine 2 MG tablet  Commonly known as:  ZANAFLEX  TK 1 T PO Q 8 H PRN     tobramycin-dexamethasone ophthalmic solution  Commonly known as:  TOBRADEX  INSTILL 2 DROPS IN THE RIGHT EYE Q 4 H WHILE AWAKE FOR 5 DAYS THEN DECREASE TO 1 DROP IN THE RIGHT EYE Q 6 HOURS FOR 5 DAYS     topiramate 200 MG tablet  Commonly known as:  TOPAMAX  Take 100 mg by mouth daily.     traMADol 50 MG tablet  Commonly known as:  ULTRAM  Take 50 mg by mouth.     triamcinolone cream 0.1 %  Commonly known as:  KENALOG  Apply topically.     trimethoprim-polymyxin b ophthalmic solution  Commonly known as:  POLYTRIM  Place 2 drops into the right eye every 4 (four) hours.     VIIBRYD 10 MG Tabs  Generic drug:  Vilazodone HCl  TK 1 T PO D     Vitamin D (Ergocalciferol) 50000 UNITS Caps capsule  Commonly known as:  DRISDOL  Take by mouth.        Allergies:  Allergies  Allergen Reactions  . Geodon [Ziprasidone Hydrochloride] Other (See Comments)     Numbness ,sob, headaches, blurred vision  . Ziprasidone Hcl Anaphylaxis    Other reaction(s): Other (See Comments), Unknown Numbness ,sob, headaches, blurred vision Numbness ,sob, headaches, blurred vision  . Sulfa Antibiotics Hives  . Ace Inhibitors Rash  . Aripiprazole Rash  . Erythromycin Rash  . Hydromorphone Rash  . Penicillins Rash    Family History: No family history on file.  Social History:  reports that she has quit smoking. She has never used smokeless tobacco. She reports that she drinks alcohol. She reports that she does not use illicit drugs.  ROS: UROLOGY Frequent Urination?: Yes Hard to postpone urination?: Yes Burning/pain with urination?: No Get up at night to urinate?: Yes Leakage of urine?: Yes Urine stream starts and stops?: Yes Trouble starting stream?: No Do you have to strain to urinate?: No Blood in urine?: No Urinary tract infection?: No Sexually transmitted disease?: No Injury to kidneys or bladder?: No Painful intercourse?: No Weak stream?: No Currently pregnant?: No Vaginal bleeding?: No Last menstrual period?: n  Gastrointestinal Nausea?: No Vomiting?: No Indigestion/heartburn?: No Diarrhea?: Yes Constipation?: No  Constitutional Fever: No Night sweats?: No Weight loss?: No Fatigue?: No  Skin Skin rash/lesions?: Yes Itching?: Yes  Eyes Blurred vision?: No Double vision?: No  Ears/Nose/Throat Sore throat?: No Sinus problems?: Yes  Hematologic/Lymphatic Swollen glands?: Yes Easy bruising?: Yes  Cardiovascular Leg swelling?: Yes Chest pain?: No  Respiratory Cough?: Yes Shortness of breath?: No  Endocrine Excessive thirst?: Yes  Musculoskeletal Back pain?: Yes Joint pain?: Yes  Neurological Headaches?: Yes Dizziness?: No  Psychologic Depression?: Yes Anxiety?: Yes  Physical Exam: BP 106/70 mmHg  Pulse 57  Ht 5\' 4"  (1.626 m)  Wt 252 lb 4.8 oz (114.443 kg)  BMI 43.29 kg/m2  Constitutional: Well  nourished. Alert and oriented, No acute distress. HEENT: [place] AT, moist mucus membranes. Trachea midline, no masses. Cardiovascular: No clubbing, cyanosis, or edema. Respiratory: Normal respiratory effort, no increased work of breathing. GI: Abdomen is soft, non tender, non distended, no abdominal masses. Liver and spleen not palpable.  No hernias appreciated.  Stool sample for occult testing is not indicated.   GU: No CVA tenderness.  No bladder fullness or masses.  Patient with circumcised/uncircumcised phallus.  Normal external female genitalia. Patent urethral meatus. Urethral without masses, tenderness or scarring. Bladder without tenderness. Vaginal exam reveals a grade 2 cystocele. No cervical motion tenderness is noted. The uterus is surgically absent. Adnexa/parametria cannot be palpated.  Rectal:  Anus and perineum without scarring or rashes.  Skin: No rashes, bruises or suspicious lesions. Lymph: No cervical or inguinal adenopathy. Neurologic: Grossly intact, no focal deficits, moving all 4 extremities. Psychiatric: Normal mood and affect.  Laboratory Data: Lab Results  Component Value Date   WBC 7.3 10/20/2012   HGB 11.2* 11/07/2012   HCT 36.7 10/20/2012   MCV 89 10/20/2012   PLT 209 11/07/2012    Lab Results  Component Value Date   CREATININE 0.76 11/07/2012    Urinalysis Results for orders placed or performed in visit on 05/18/15  Microscopic  Examination  Result Value Ref Range   WBC, UA None seen 0 -  5 /hpf   RBC, UA None seen 0 -  2 /hpf   Epithelial Cells (non renal) 0-10 0 - 10 /hpf   Renal Epithel, UA None seen None seen /hpf   Bacteria, UA Few (A) None seen/Few  Urinalysis, Complete  Result Value Ref Range   Specific Gravity, UA >1.030 (H) 1.005 - 1.030   pH, UA 5.5 5.0 - 7.5   Color, UA Yellow Yellow   Appearance Ur Clear Clear   Leukocytes, UA Negative Negative   Protein, UA Negative Negative/Trace   Glucose, UA Negative Negative   Ketones, UA  Negative Negative   RBC, UA Negative Negative   Bilirubin, UA Negative Negative   Urobilinogen, Ur 0.2 0.2 - 1.0 mg/dL   Nitrite, UA Negative Negative   Microscopic Examination See below:   BLADDER SCAN AMB NON-IMAGING  Result Value Ref Range   Scan Result 15    Pertinent Imaging: Results for CHRISHAWNA, FARINA (MRN [id number]) as of 05/18/2015 15:56  Ref. Range 05/18/2015 09:07  Scan Result Unknown 15    Assessment & Plan:    1. SUI:   Patient has noticed and worsening of her stress urinary incontinence during an upper respiratory infection that has not improved with the resolution of the infection.  She would like to be evaluated for a urethral sling.  I will refer her to Dr. Matilde Sprang for further evaluation.  - Urinalysis, Complete - BLADDER SCAN AMB NON-IMAGING  Return for appointment with Dr. Matilde Sprang for SUI.  Zara Council, [facility] [address], [place], [place] [address] [phone]

## 2015-05-19 ENCOUNTER — Ambulatory Visit: Payer: Self-pay | Admitting: Urology

## 2015-05-19 ENCOUNTER — Ambulatory Visit: Payer: Medicare Other

## 2015-05-26 ENCOUNTER — Emergency Department
Admission: EM | Admit: 2015-05-26 | Discharge: 2015-05-26 | Disposition: A | Discharge status: Home / Self Care | Payer: Medicare Other | Attending: Emergency Medicine | Admitting: Emergency Medicine

## 2015-05-26 ENCOUNTER — Encounter: Payer: Self-pay | Admitting: Emergency Medicine

## 2015-05-26 ENCOUNTER — Emergency Department: Payer: Medicare Other

## 2015-05-26 DIAGNOSIS — Z792 Long term (current) use of antibiotics: Secondary | ICD-10-CM | POA: Insufficient documentation

## 2015-05-26 DIAGNOSIS — Z87891 Personal history of nicotine dependence: Secondary | ICD-10-CM | POA: Insufficient documentation

## 2015-05-26 DIAGNOSIS — Z7951 Long term (current) use of inhaled steroids: Secondary | ICD-10-CM | POA: Insufficient documentation

## 2015-05-26 DIAGNOSIS — J40 Bronchitis, not specified as acute or chronic: Secondary | ICD-10-CM

## 2015-05-26 DIAGNOSIS — Z79899 Other long term (current) drug therapy: Secondary | ICD-10-CM | POA: Diagnosis not present

## 2015-05-26 DIAGNOSIS — Z880 Allergy status to penicillin: Secondary | ICD-10-CM | POA: Insufficient documentation

## 2015-05-26 DIAGNOSIS — M549 Dorsalgia, unspecified: Secondary | ICD-10-CM | POA: Insufficient documentation

## 2015-05-26 DIAGNOSIS — R200 Anesthesia of skin: Secondary | ICD-10-CM | POA: Insufficient documentation

## 2015-05-26 DIAGNOSIS — J45901 Unspecified asthma with (acute) exacerbation: Secondary | ICD-10-CM | POA: Diagnosis not present

## 2015-05-26 DIAGNOSIS — R079 Chest pain, unspecified: Secondary | ICD-10-CM | POA: Diagnosis present

## 2015-05-26 DIAGNOSIS — R0789 Other chest pain: Secondary | ICD-10-CM

## 2015-05-26 LAB — CBC
HEMATOCRIT: 35.6 % (ref 35.0–47.0)
HEMOGLOBIN: 11.4 g/dL — AB (ref 12.0–16.0)
MCH: 26.6 pg (ref 26.0–34.0)
MCHC: 32.1 g/dL (ref 32.0–36.0)
MCV: 83 fL (ref 80.0–100.0)
Platelets: 295 10*3/uL (ref 150–440)
RBC: 4.29 MIL/uL (ref 3.80–5.20)
RDW: 15.9 % — AB (ref 11.5–14.5)
WBC: 8.5 10*3/uL (ref 3.6–11.0)

## 2015-05-26 LAB — COMPREHENSIVE METABOLIC PANEL
ALBUMIN: 3.9 g/dL (ref 3.5–5.0)
ALK PHOS: 75 U/L (ref 38–126)
ALT: 18 U/L (ref 14–54)
ANION GAP: 4 — AB (ref 5–15)
AST: 20 U/L (ref 15–41)
BILIRUBIN TOTAL: 0.7 mg/dL (ref 0.3–1.2)
BUN: 8 mg/dL (ref 6–20)
CALCIUM: 8.9 mg/dL (ref 8.9–10.3)
CO2: 26 mmol/L (ref 22–32)
Chloride: 105 mmol/L (ref 101–111)
Creatinine, Ser: 0.68 mg/dL (ref 0.44–1.00)
GFR calc Af Amer: >60 mL/min (ref >60)
GLUCOSE: 96 mg/dL (ref 65–99)
POTASSIUM: 3.6 mmol/L (ref 3.5–5.1)
Sodium: 135 mmol/L (ref 135–145)
TOTAL PROTEIN: 7.3 g/dL (ref 6.5–8.1)

## 2015-05-26 LAB — TROPONIN I

## 2015-05-26 MED ORDER — IOHEXOL 350 MG/ML SOLN
100.0000 mL | Freq: Once | INTRAVENOUS | Status: AC | PRN
  Administered 2015-05-26: 100 mL via INTRAVENOUS
  Filled 2015-05-26: qty 100

## 2015-05-26 NOTE — ED Provider Notes (Signed)
[facility] Emergency Department Provider Note  ____________________________________________  Time seen: Approximately 7:42 PM  I have reviewed the triage vital signs and the nursing notes.   HISTORY  Chief Complaint Chest Pain    HPI Alexis Christensen is a 44 y.o. female w/ PMH of RLE DVT on Zollie Beckers presents with several days of chest pain and tightness, occasional difficulty breathing, "tightness" in her back, and some numbness in bilateral upper extremities.  She saw her PCP this morning, but when her symptoms worsening in the PM, the PCP recommended she go to the ED.    She denies fever/chills, HA, N/V/D, abd pain, leg pain/swelling.  Chest wall is tender to palpation, and movement makes it worse.  Deep breaths also make it worse and actually makes it feel harder to breathe.   Past Medical History  Diagnosis Date  . Bipolar 1 disorder ([place])   . Thyroid disease   . Post traumatic stress disorder (PTSD)   . Anxiety   . Depression   . Headache(784.0)   . Stress incontinence   . Urge incontinence   . Heart burn   . Arthritis   . Asthma   . DVT (deep venous thrombosis) ([place])   . Heart murmur   . HLD (hyperlipidemia)   . Adopted   . History of kidney stones     Patient Active Problem List   Diagnosis Date Noted  . SUI (stress urinary incontinence, female) 05/18/2015  . Lipoma of arm 05/11/2013    Past Surgical History  Procedure Laterality Date  . Wrist surgery    . Ankle surgery    . Knee arthroscopy    . Tubal ligation    . Cholecystectomy    . Abdominal hysterectomy    . Shoulder surgery Right 2014  . Replacement total knee Right   . Lymph node removal  2015    Neck    Current Outpatient Rx  Name  Route  Sig  Dispense  Refill  . acetaminophen (TYLENOL) 500 MG tablet   Oral   Take 500 mg by mouth every 6 (six) hours as needed. For bad headache          . benzonatate (TESSALON) 100 MG capsule      TK 1 C PO Q 8 H PRF COUGH      0   . buPROPion (WELLBUTRIN SR) 200 MG 12 hr tablet   Oral   Take 200 mg by mouth 2 (two) times daily.           . busPIRone (BUSPAR) 15 MG tablet   Oral   Take 15 mg by mouth.         . cyclobenzaprine (FLEXERIL) 10 MG tablet      once a day         . dicyclomine (BENTYL) 10 MG capsule      TK ONE C PO Q 8 H PRF ABDOMINAL PAIN      0   . doxepin (SINEQUAN) 50 MG capsule               . EPINEPHrine (EPIPEN 2-PAK) 0.3 mg/0.3 mL IJ SOAJ injection               . fluticasone (FLONASE) 50 MCG/ACT nasal spray               . furosemide (LASIX) 20 MG tablet   Oral   Take by mouth.         Marland Kitchen  furosemide (LASIX) 40 MG tablet      TK 1 T PO QAM      0   . gabapentin (NEURONTIN) 800 MG tablet   Oral   Take 1.5 tablets (1,200 mg total) by mouth 3 (three) times daily.   90 tablet   0   . HYDROcodone-acetaminophen (NORCO/VICODIN) 5-325 MG per tablet               . hydrOXYzine (ATARAX/VISTARIL) 25 MG tablet   Oral   Take by mouth.         Marland Kitchen ketoconazole (NIZORAL) 2 % cream   Topical   Apply topically.         Marland Kitchen ketorolac (TORADOL) 10 MG tablet   Oral   Take 1 tablet (10 mg total) by mouth every 6 (six) hours as needed. Patient not taking: Reported on 05/18/2015   20 tablet   0   . levocetirizine (XYZAL) 5 MG tablet   Oral   Take 5 mg by mouth.         . EXPIRED: levothyroxine (SYNTHROID, LEVOTHROID) 150 MCG tablet   Oral   Take 1 tablet (150 mcg total) by mouth daily before breakfast.   30 tablet   0   . levothyroxine (SYNTHROID, LEVOTHROID) 300 MCG tablet   Oral   Take 600 mcg by mouth daily before breakfast.         . methocarbamol (ROBAXIN-750) 750 MG tablet   Oral   Take 1 tablet (750 mg total) by mouth every 6 (six) hours as needed for muscle spasms. Patient not taking: Reported on 05/18/2015   16 tablet   0   . metroNIDAZOLE (FLAGYL) 500 MG tablet      TK 1 T PO BID FOR 7 DAYS      0   . Olopatadine HCl  (PATADAY) 0.2 % SOLN               . omeprazole (PRILOSEC) 20 MG capsule   Oral   Take 20 mg by mouth.         . potassium chloride SA (K-DUR,KLOR-CON) 20 MEQ tablet   Oral   Take by mouth.         . EXPIRED: prazosin (MINIPRESS) 2 MG capsule   Oral   Take 1 capsule (2 mg total) by mouth at bedtime.   30 capsule   0   . PROAIR HFA 108 (90 BASE) MCG/ACT inhaler      INHALE 1 PUFF PO Q 6 H PRF SOB OR WHEEZING      0     Dispense as written.   Marland Kitchen QUEtiapine (SEROQUEL) 300 MG tablet      TK 1 T PO QHS      0   . ranitidine (ZANTAC) 150 MG capsule               . rivaroxaban (XARELTO) 20 MG TABS tablet   Oral   Take 20 mg by mouth daily with supper.         . simvastatin (ZOCOR) 20 MG tablet      1 every evening         . SUBOXONE 8-2 MG FILM      DOSSOLVE 1 FILM UNDER THE TONGUE QD FOR 7 DAYS      0     Dispense as written.   . SUMAtriptan (IMITREX) 50 MG tablet               .  tiZANidine (ZANAFLEX) 2 MG tablet      TK 1 T PO Q 8 H PRN      1   . tobramycin-dexamethasone (TOBRADEX) ophthalmic solution      INSTILL 2 DROPS IN THE RIGHT EYE Q 4 H WHILE AWAKE FOR 5 DAYS THEN DECREASE TO 1 DROP IN THE RIGHT EYE Q 6 HOURS FOR 5 DAYS      1   . EXPIRED: topiramate (TOPAMAX) 200 MG tablet   Oral   Take 100 mg by mouth daily.         . traMADol (ULTRAM) 50 MG tablet   Oral   Take 50 mg by mouth.         . triamcinolone cream (KENALOG) 0.1 %   Topical   Apply topically.         Marland Kitchen trimethoprim-polymyxin b (POLYTRIM) ophthalmic solution   Right Eye   Place 2 drops into the right eye every 4 (four) hours. Patient not taking: Reported on 05/18/2015   10 mL   0   . VIIBRYD 10 MG TABS      TK 1 T PO D      0     Dispense as written.   . Vitamin D, Ergocalciferol, (DRISDOL) 50000 UNITS CAPS capsule   Oral   Take by mouth.           Allergies Geodon; Ziprasidone hcl; Sulfa antibiotics; Ace inhibitors; Aripiprazole;  Erythromycin; Hydromorphone; and Penicillins  No family history on file.  Social History Social History  Substance Use Topics  . Smoking status: Former Smoker -- 1.00 packs/day for 20 years  . Smokeless tobacco: Never Used     Comment: quit 2013  . Alcohol Use: 0.0 oz/week    0 Standard drinks or equivalent per week     Comment: occasional    Review of Systems Constitutional: No fever/chills Eyes: No visual changes. ENT: No sore throat. Cardiovascular: Tight/sharp CP Respiratory: Mild SOB Gastrointestinal: No abdominal pain.  No nausea, no vomiting.  No diarrhea.  No constipation. Genitourinary: Negative for dysuria. Musculoskeletal: Some back pain.  Reproducible w/ movement Skin: Negative for rash. Neurological: Negative for headaches, focal weakness or numbness.  10-point ROS otherwise negative.  ____________________________________________   PHYSICAL EXAM:  VITAL SIGNS: ED Triage Vitals  Enc Vitals Group     BP 05/26/15 1622 118/70 mmHg     Pulse Rate 05/26/15 1622 62     Resp 05/26/15 1622 18     Temp 05/26/15 1622 98.4 F (36.9 C)     Temp Source 05/26/15 1622 Oral     SpO2 05/26/15 1622 99 %     Weight 05/26/15 1622 245 lb (111.131 kg)     Height 05/26/15 1622 5\' 7"  (1.702 m)     Head Cir --      Peak Flow --      Pain Score 05/26/15 1622 9     Pain Loc --      Pain Edu? --      Excl. in [place]? --     Constitutional: Alert and oriented. Well appearing and in no acute distress at rest. Eyes: Conjunctivae are normal. PERRL. EOMI. Head: Atraumatic. Nose: No congestion/rhinnorhea. Mouth/Throat: Mucous membranes are moist.  Oropharynx non-erythematous. Neck: No stridor.   Cardiovascular: Normal rate, regular rhythm. Grossly normal heart sounds.  Good peripheral circulation. Respiratory: Normal respiratory effort.  No retractions. Lungs CTAB. TTP of chest wall throughout anterior and posterior.   Gastrointestinal: Soft  and nontender. No distention. No  abdominal bruits. No CVA tenderness. Musculoskeletal: No lower extremity tenderness nor edema.  No joint effusions. Neurologic:  Normal speech and language. No gross focal neurologic deficits are appreciated.  Skin:  Skin is warm, dry and intact. No rash noted. Psychiatric: Mood and affect are normal. Speech and behavior are normal.  ____________________________________________   LABS (all labs ordered are listed, but only abnormal results are displayed)  Labs Reviewed  CBC - Abnormal; Notable for the following:    Hemoglobin 11.4 (*)    RDW 15.9 (*)    All other components within normal limits  COMPREHENSIVE METABOLIC PANEL - Abnormal; Notable for the following:    Anion gap 4 (*)    All other components within normal limits  TROPONIN I   ____________________________________________  EKG  ED ECG REPORT I, Vivian Neuwirth, the attending physician, personally viewed and interpreted this ECG.  Date: 05/26/2015 EKG Time: 16:15 Rate: 53 Rhythm: Sinus bradycardia QRS Axis: normal Intervals: normal ST/T Wave abnormalities: normal Conduction Disutrbances: none Narrative Interpretation: unremarkable  ____________________________________________  RADIOLOGY   Ct Angio Chest Pe W/cm &/or Wo Cm  05/26/2015  CLINICAL DATA:  44 year old with persistent chest tightness all day today with intermittent shortness of breath. Right lower extremity DVT several months ago for which she was anticoagulated. EXAM: CT ANGIOGRAPHY CHEST WITH CONTRAST TECHNIQUE: Multidetector CT imaging of the chest was performed using the standard protocol during bolus administration of intravenous contrast. Multiplanar CT image reconstructions and MIPs were obtained to evaluate the vascular anatomy. CONTRAST:  139mL OMNIPAQUE IOHEXOL 350 MG/ML IV. COMPARISON:  None. FINDINGS: Technical quality:  Good. Pulmonary embolism:  Absent. Cardiovascular: Heart size normal. No visible coronary atherosclerosis. Left coronary  artery dominance with a diminutive right coronary artery. No pericardial effusion. No visible atherosclerosis involving the thoracic or upper abdominal aorta. Mediastinum/Lymph Nodes: No pathologic lymphadenopathy. No mediastinal masses. Normal-appearing esophagus throughout the mediastinum. Small hiatal hernia. Lungs/pleura: Expected mild dependent atelectasis posteriorly in the lower lobes. Pulmonary parenchyma clear without localized airspace consolidation, interstitial disease, or parenchymal nodules or masses. Central airways patent without significant bronchial wall thickening. No pleural effusions. No pleural plaques or masses. Central airways patent with mild bronchial wall thickening. No pleural effusions. Upper abdomen: Apart from the small hiatal hernia, visualized upper abdomen unremarkable for the early arterial phase of enhancement. Musculoskeletal: Mild degenerative disc disease and spondylosis at C5-6 and T8-9. Review of the MIP images confirms the above findings. IMPRESSION: 1. No evidence of pulmonary embolism. 2. Mild central bronchial wall thickening indicating bronchitis and/or asthma. No acute cardiopulmonary disease otherwise. 3. Small hiatal hernia. Electronically Signed   By: Evangeline Dakin M.D.   On: 05/26/2015 20:29    ____________________________________________   PROCEDURES  Procedure(s) performed: None  Critical Care performed: No ____________________________________________   INITIAL IMPRESSION / ASSESSMENT AND PLAN / ED COURSE  Pertinent labs & imaging results that were available during my care of the patient were reviewed by me and considered in my medical decision making (see chart for details).  The patient is generally well-appearing at this time but has a very complicated past medical history that includes DVT and Xarelto.  Given that she is having this pleuritic chest pain and some shortness of breath, I feel it is necessary to evaluate with a CTA of her  chest to make sure that there is no obvious PE in the setting of a known DVT.  The rest of her labs and ECG have been unremarkable.  We will reevaluate after the imaging.  ----------------------------------------- 8:56 PM on 05/26/2015 -----------------------------------------  Chest CT unremarkable other than bronchitis for which she has started being treated.  The patient is feeling well and having no more symptoms since she has been in the emergency department.  There is no indication for repeat troponin given the duration of her symptoms.  I gave her the reassuring findings and recommended that she follow up as an outpatient.  She understands and agrees with this plan.  ____________________________________________  FINAL CLINICAL IMPRESSION(S) / ED DIAGNOSES  Final diagnoses:  Bronchitis  Atypical chest pain      NEW MEDICATIONS STARTED DURING THIS VISIT:  New Prescriptions   No medications on file     Hinda Kehr, MD 05/26/15 2057

## 2015-05-26 NOTE — ED Notes (Addendum)
Pt finished levaquin today for sinus infection, to start prednisone tomorrow by PCP order.  Pt states dx with blood clot to right leg several months ago.

## 2015-05-26 NOTE — Discharge Instructions (Signed)
As we discussed, your workup today was reassuring.  Please continue taking your regular medications and follow-up with your regular doctor in several days.  Return to the emergency department if you develop any new or worsening symptoms that concern you.   Chest Wall Pain Chest wall pain is pain in or around the bones and muscles of your chest. Sometimes, an injury causes this pain. Sometimes, the cause may not be known. This pain may take several weeks or longer to get better. HOME CARE INSTRUCTIONS  Pay attention to any changes in your symptoms. Take these actions to help with your pain:   Rest as told by your health care provider.   Avoid activities that cause pain. These include any activities that use your chest muscles or your abdominal and side muscles to lift heavy items.   If directed, apply ice to the painful area:  Put ice in a plastic bag.  Place a towel between your skin and the bag.  Leave the ice on for 20 minutes, 2-3 times per day.  Take over-the-counter and prescription medicines only as told by your health care provider.  Do not use tobacco products, including cigarettes, chewing tobacco, and e-cigarettes. If you need help quitting, ask your health care provider.  Keep all follow-up visits as told by your health care provider. This is important. SEEK MEDICAL CARE IF:  You have a fever.  Your chest pain becomes worse.  You have new symptoms. SEEK IMMEDIATE MEDICAL CARE IF:  You have nausea or vomiting.  You feel sweaty or light-headed.  You have a cough with phlegm (sputum) or you cough up blood.  You develop shortness of breath.   This information is not intended to replace advice given to you by your health care provider. Make sure you discuss any questions you have with your health care provider.   Document Released: 07/02/2005 Document Revised: 03/23/2015 Document Reviewed: 09/27/2014 Elsevier Interactive Patient Education [employer].

## 2015-05-26 NOTE — ED Notes (Signed)
Pt states she has tightness across chest all day, states at times she has some sob, also has had some numbness to bilateral arms since Tuesday, today started having some right leg numbness, states she saw her PCP today and she thought the numbness was coming from inflammation to her back

## 2015-05-29 ENCOUNTER — Encounter: Payer: Self-pay | Admitting: Emergency Medicine

## 2015-05-29 ENCOUNTER — Emergency Department
Admission: EM | Admit: 2015-05-29 | Discharge: 2015-05-29 | Disposition: A | Discharge status: Home / Self Care | Payer: Medicare Other | Attending: Emergency Medicine | Admitting: Emergency Medicine

## 2015-05-29 DIAGNOSIS — M542 Cervicalgia: Secondary | ICD-10-CM | POA: Diagnosis present

## 2015-05-29 DIAGNOSIS — R5383 Other fatigue: Secondary | ICD-10-CM | POA: Insufficient documentation

## 2015-05-29 DIAGNOSIS — Z87891 Personal history of nicotine dependence: Secondary | ICD-10-CM | POA: Diagnosis not present

## 2015-05-29 DIAGNOSIS — Z792 Long term (current) use of antibiotics: Secondary | ICD-10-CM | POA: Insufficient documentation

## 2015-05-29 DIAGNOSIS — M62838 Other muscle spasm: Secondary | ICD-10-CM | POA: Diagnosis not present

## 2015-05-29 DIAGNOSIS — Z880 Allergy status to penicillin: Secondary | ICD-10-CM | POA: Diagnosis not present

## 2015-05-29 DIAGNOSIS — Z7902 Long term (current) use of antithrombotics/antiplatelets: Secondary | ICD-10-CM | POA: Insufficient documentation

## 2015-05-29 DIAGNOSIS — Z79899 Other long term (current) drug therapy: Secondary | ICD-10-CM | POA: Insufficient documentation

## 2015-05-29 NOTE — Discharge Instructions (Signed)
Heat Therapy Heat therapy can help ease sore, stiff, injured, and tight muscles and joints. Heat relaxes your muscles, which may help ease your pain. Heat therapy should only be used on old, pre-existing, or long-lasting (chronic) injuries. Do not use heat therapy unless told by your doctor. HOW TO USE HEAT THERAPY There are several different kinds of heat therapy, including:  Moist heat pack.  Warm water bath.  Hot water bottle.  Electric heating pad.  Heated gel pack.  Heated wrap.  Electric heating pad. GENERAL HEAT THERAPY RECOMMENDATIONS   Do not sleep while using heat therapy. Only use heat therapy while you are awake.  Your skin may turn pink while using heat therapy. Do not use heat therapy if your skin turns red.  Do not use heat therapy if you have new pain.  High heat or long exposure to heat can cause burns. Be careful when using heat therapy to avoid burning your skin.  Do not use heat therapy on areas of your skin that are already irritated, such as with a rash or sunburn. GET HELP IF:   You have blisters, redness, swelling (puffiness), or numbness.  You have new pain.  Your pain is worse. MAKE SURE YOU:  Understand these instructions.  Will watch your condition.  Will get help right away if you are not doing well or get worse.   This information is not intended to replace advice given to you by your health care provider. Make sure you discuss any questions you have with your health care provider.   Document Released: 09/24/2011 Document Revised: 07/23/2014 Document Reviewed: 08/25/2013 [employer] Interactive Patient Education 2016 [employer]   Cervical Strain and Sprain With Rehab    Cervical strain and sprain are injuries that commonly occur with "whiplash" injuries. Whiplash occurs when the neck is forcefully whipped backward or forward, such as during a motor vehicle accident or during contact sports. The muscles, ligaments, tendons, discs, and  nerves of the neck are susceptible to injury when this occurs.  RISK FACTORS  Risk of having a whiplash injury increases if:  Osteoarthritis of the spine.  Situations that make head or neck accidents or trauma more likely.  High-risk sports (football, rugby, wrestling, hockey, auto racing, gymnastics, diving, contact karate, or boxing).  Poor strength and flexibility of the neck.  Previous neck injury.  Poor tackling technique.  Improperly fitted or padded equipment. SYMPTOMS  Pain or stiffness in the front or back of neck or both.  Symptoms may present immediately or up to 24 hours after injury.  Dizziness, headache, nausea, and vomiting.  Muscle spasm with soreness and stiffness in the neck.  Tenderness and swelling at the injury site. PREVENTION  Learn and use proper technique (avoid tackling with the head, spearing, and head-butting; use proper falling techniques to avoid landing on the head).  Warm up and stretch properly before activity.  Maintain physical fitness:  Strength, flexibility, and endurance.  Cardiovascular fitness. Wear properly fitted and padded protective equipment, such as padded soft collars, for participation in contact sports. PROGNOSIS  Recovery from cervical strain and sprain injuries is dependent on the extent of the injury. These injuries are usually curable in 1 week to 3 months with appropriate treatment.  RELATED COMPLICATIONS  Temporary numbness and weakness may occur if the nerve roots are damaged, and this may persist until the nerve has completely healed.  Chronic pain due to frequent recurrence of symptoms.  Prolonged healing, especially if activity is resumed too  soon (before complete recovery). TREATMENT  Treatment initially involves the use of ice and medication to help reduce pain and inflammation. It is also important to perform strengthening and stretching exercises and modify activities that worsen symptoms so the injury does not get worse.  These exercises may be performed at home or with a therapist. For patients who experience severe symptoms, a soft, padded collar may be recommended to be worn around the neck.  Improving your posture may help reduce symptoms. Posture improvement includes pulling your chin and abdomen in while sitting or standing. If you are sitting, sit in a firm chair with your buttocks against the back of the chair. While sleeping, try replacing your pillow with a small towel rolled to 2 inches in diameter, or use a cervical pillow or soft cervical collar. Poor sleeping positions delay healing.  For patients with nerve root damage, which causes numbness or weakness, the use of a cervical traction apparatus may be recommended. Surgery is rarely necessary for these injuries. However, cervical strain and sprains that are present at birth (congenital) may require surgery.  MEDICATION  If pain medication is necessary, nonsteroidal anti-inflammatory medications, such as aspirin and ibuprofen, or other minor pain relievers, such as acetaminophen, are often recommended.  Do not take pain medication for 7 days before surgery.  Prescription pain relievers may be given if deemed necessary by your caregiver. Use only as directed and only as much as you need. HEAT AND COLD:  Cold treatment (icing) relieves pain and reduces inflammation. Cold treatment should be applied for 10 to 15 minutes every 2 to 3 hours for inflammation and pain and immediately after any activity that aggravates your symptoms. Use ice packs or an ice massage.  Heat treatment may be used prior to performing the stretching and strengthening activities prescribed by your caregiver, physical therapist, or athletic trainer. Use a heat pack or a warm soak. SEEK MEDICAL CARE IF:  Symptoms get worse or do not improve in 2 weeks despite treatment.  New, unexplained symptoms develop (drugs used in treatment may produce side effects). EXERCISES  RANGE OF MOTION (ROM)  AND STRETCHING EXERCISES - Cervical Strain and Sprain  These exercises may help you when beginning to rehabilitate your injury. In order to successfully resolve your symptoms, you must improve your posture. These exercises are designed to help reduce the forward-head and rounded-shoulder posture which contributes to this condition. Your symptoms may resolve with or without further involvement from your physician, physical therapist or athletic trainer. While completing these exercises, remember:  Restoring tissue flexibility helps normal motion to return to the joints. This allows healthier, less painful movement and activity.  An effective stretch should be held for at least 20 seconds, although you may need to begin with shorter hold times for comfort.  A stretch should never be painful. You should only feel a gentle lengthening or release in the stretched tissue.   STRETCH- Axial Extensors  Lie on your back on the floor. You may bend your knees for comfort. Place a rolled-up hand towel or dish towel, about 2 inches in diameter, under the part of your head that makes contact with the floor.  Gently tuck your chin, as if trying to make a "double chin," until you feel a gentle stretch at the base of your head.  Hold __________ seconds. Repeat __________ times. Complete this exercise __________ times per day.    STRETCH - Axial Extension  Stand or sit on a firm surface. Assume a  good posture: chest up, shoulders drawn back, abdominal muscles slightly tense, knees unlocked (if standing) and feet hip width apart.  Slowly retract your chin so your head slides back and your chin slightly lowers. Continue to look straight ahead.  You should feel a gentle stretch in the back of your head. Be certain not to feel an aggressive stretch since this can cause headaches later.  Hold for __________ seconds. Repeat __________ times. Complete this exercise __________ times per day.    STRETCH - Cervical Side  Bend  Stand or sit on a firm surface. Assume a good posture: chest up, shoulders drawn back, abdominal muscles slightly tense, knees unlocked (if standing) and feet hip width apart.  Without letting your nose or shoulders move, slowly tip your right / left ear to your shoulder until your feel a gentle stretch in the muscles on the opposite side of your neck.  Hold __________ seconds. Repeat __________ times. Complete this exercise __________ times per day.    STRETCH - Cervical Rotators  Stand or sit on a firm surface. Assume a good posture: chest up, shoulders drawn back, abdominal muscles slightly tense, knees unlocked (if standing) and feet hip width apart.  Keeping your eyes level with the ground, slowly turn your head until you feel a gentle stretch along the back and opposite side of your neck.  Hold __________ seconds. Repeat __________ times. Complete this exercise __________ times per day.    RANGE OF MOTION - Neck Circles  Stand or sit on a firm surface. Assume a good posture: chest up, shoulders drawn back, abdominal muscles slightly tense, knees unlocked (if standing) and feet hip width apart.  Gently roll your head down and around from the back of one shoulder to the back of the other. The motion should never be forced or painful.  Repeat the motion 10-20 times, or until you feel the neck muscles relax and loosen. Repeat __________ times. Complete the exercise __________ times per day.  STRENGTHENING EXERCISES - Cervical Strain and Sprain  These exercises may help you when beginning to rehabilitate your injury. They may resolve your symptoms with or without further involvement from your physician, physical therapist, or athletic trainer. While completing these exercises, remember:  Muscles can gain both the endurance and the strength needed for everyday activities through controlled exercises.  Complete these exercises as instructed by your physician, physical therapist, or  athletic trainer. Progress the resistance and repetitions only as guided.  You may experience muscle soreness or fatigue, but the pain or discomfort you are trying to eliminate should never worsen during these exercises. If this pain does worsen, stop and make certain you are following the directions exactly. If the pain is still present after adjustments, discontinue the exercise until you can discuss the trouble with your clinician.   STRENGTH - Cervical Flexors, Isometric  Face a wall, standing about 6 inches away. Place a small pillow, a ball about 6-8 inches in diameter, or a folded towel between your forehead and the wall.  Slightly tuck your chin and gently push your forehead into the soft object. Push only with mild to moderate intensity, building up tension gradually. Keep your jaw and forehead relaxed.  Hold 10 to 20 seconds. Keep your breathing relaxed.  Release the tension slowly. Relax your neck muscles completely before you start the next repetition. Repeat __________ times. Complete this exercise __________ times per day.    STRENGTH- Cervical Lateral Flexors, Isometric  Stand about 6 inches away  from a wall. Place a small pillow, a ball about 6-8 inches in diameter, or a folded towel between the side of your head and the wall.  Slightly tuck your chin and gently tilt your head into the soft object. Push only with mild to moderate intensity, building up tension gradually. Keep your jaw and forehead relaxed.  Hold 10 to 20 seconds. Keep your breathing relaxed.  Release the tension slowly. Relax your neck muscles completely before you start the next repetition. Repeat __________ times. Complete this exercise __________ times per day.    STRENGTH - Cervical Extensors, Isometric  Stand about 6 inches away from a wall. Place a small pillow, a ball about 6-8 inches in diameter, or a folded towel between the back of your head and the wall.  Slightly tuck your chin and gently tilt your  head back into the soft object. Push only with mild to moderate intensity, building up tension gradually. Keep your jaw and forehead relaxed.  Hold 10 to 20 seconds. Keep your breathing relaxed.  Release the tension slowly. Relax your neck muscles completely before you start the next repetition. Repeat __________ times. Complete this exercise __________ times per day.  POSTURE AND BODY MECHANICS CONSIDERATIONS - Cervical Strain and Sprain  Keeping correct posture when sitting, standing or completing your activities will reduce the stress put on different body tissues, allowing injured tissues a chance to heal and limiting painful experiences. The following are general guidelines for improved posture. Your physician or physical therapist will provide you with any instructions specific to your needs. While reading these guidelines, remember:  The exercises prescribed by your provider will help you have the flexibility and strength to maintain correct postures.  The correct posture provides the optimal environment for your joints to work. All of your joints have less wear and tear when properly supported by a spine with good posture. This means you will experience a healthier, less painful body.  Correct posture must be practiced with all of your activities, especially prolonged sitting and standing. Correct posture is as important when doing repetitive low-stress activities (typing) as it is when doing a single heavy-load activity (lifting).   PROLONGED STANDING WHILE SLIGHTLY LEANING FORWARD  When completing a task that requires you to lean forward while standing in one place for a long time, place either foot up on a stationary 2- to 4-inch high object to help maintain the best posture. When both feet are on the ground, the low back tends to lose its slight inward curve. If this curve flattens (or becomes too large), then the back and your other joints will experience too much stress, fatigue more  quickly, and can cause pain.    RESTING POSITIONS  Consider which positions are most painful for you when choosing a resting position. If you have pain with flexion-based activities (sitting, bending, stooping, squatting), choose a position that allows you to rest in a less flexed posture. You would want to avoid curling into a fetal position on your side. If your pain worsens with extension-based activities (prolonged standing, working overhead), avoid resting in an extended position such as sleeping on your stomach. Most people will find more comfort when they rest with their spine in a more neutral position, neither too rounded nor too arched. Lying on a non-sagging bed on your side with a pillow between your knees, or on your back with a pillow under your knees will often provide some relief. Keep in mind, being in any one  position for a prolonged period of time, no matter how correct your posture, can still lead to stiffness.    WALKING  Walk with an upright posture. Your ears, shoulders, and hips should all line up.  OFFICE WORK  When working at a desk, create an environment that supports good, upright posture. Without extra support, muscles fatigue and lead to excessive strain on joints and other tissues.    CHAIR:  A chair should be able to slide under your desk when your back makes contact with the back of the chair. This allows you to work closely.  The chair's height should allow your eyes to be level with the upper part of your monitor and your hands to be slightly lower than your elbows.  Body position:  Your feet should make contact with the floor. If this is not possible, use a foot rest.  Keep your ears over your shoulders. This will reduce stress on your neck and low back. This information is not intended to replace advice given to you by your health care provider. Make sure you discuss any questions you have with your health care provider.  Document Released: 07/02/2005 Document  Revised: 07/23/2014 Document Reviewed: 10/14/2008  [employer] Interactive Patient Education [employer].

## 2015-05-29 NOTE — ED Provider Notes (Signed)
CSN: [id number]     Arrival date & time 05/29/15  1634 History   First MD Initiated Contact with Patient 05/29/15 1651     Chief Complaint  Patient presents with  . Neck Pain     (Consider location/radiation/quality/duration/timing/severity/associated sxs/prior Treatment) HPI  44 year old female presents to the emergency department for evaluation of left cervical pain. 2 days ago, patient describes tightness and swelling to the left trapezius. She has tightness with left and right cervical rotation. Pain is mild to moderate, increased with range of motion of the neck.. She is currently on tizanidine. She started 6 day prednisone taper today for bronchitis. She denies any trauma or injury. No numbness tingling or radicular symptoms. No fevers. Bronchitis has improved significantly over the last 3 days, no coughing, shortness of breath.  Past Medical History  Diagnosis Date  . Bipolar 1 disorder ([place])   . Thyroid disease   . Post traumatic stress disorder (PTSD)   . Anxiety   . Depression   . Headache(784.0)   . Stress incontinence   . Urge incontinence   . Heart burn   . Arthritis   . Asthma   . DVT (deep venous thrombosis) ([place])   . Heart murmur   . HLD (hyperlipidemia)   . Adopted   . History of kidney stones    Past Surgical History  Procedure Laterality Date  . Wrist surgery    . Ankle surgery    . Knee arthroscopy    . Tubal ligation    . Cholecystectomy    . Abdominal hysterectomy    . Shoulder surgery Right 2014  . Replacement total knee Right   . Lymph node removal  2015    Neck   No family history on file. Social History  Substance Use Topics  . Smoking status: Former Smoker -- 1.00 packs/day for 20 years  . Smokeless tobacco: Never Used     Comment: quit 2013  . Alcohol Use: 0.0 oz/week    0 Standard drinks or equivalent per week     Comment: occasional   OB History    Gravida Para Term Preterm AB TAB SAB Ectopic Multiple Living   4 2   2  2   2        Obstetric Comments   1st Menstrual Cycle:  10 1st Pregnancy:  24     Review of Systems  Constitutional: Negative for fever, chills, activity change and fatigue.  HENT: Negative for congestion, sinus pressure and sore throat.   Eyes: Negative for visual disturbance.  Respiratory: Negative for cough, chest tightness and shortness of breath.   Cardiovascular: Negative for chest pain and leg swelling.  Gastrointestinal: Negative for nausea, vomiting, abdominal pain and diarrhea.  Genitourinary: Negative for dysuria.  Musculoskeletal: Negative for arthralgias and gait problem. Neck pain: left-sided.  Skin: Negative for rash.  Neurological: Negative for weakness, numbness and headaches.  Hematological: Negative for adenopathy.  Psychiatric/Behavioral: Negative for behavioral problems, confusion and agitation.      Allergies  Geodon; Ziprasidone hcl; Sulfa antibiotics; Ace inhibitors; Aripiprazole; Erythromycin; Hydromorphone; and Penicillins  Home Medications   Prior to Admission medications   Medication Sig Start Date End Date Taking? Authorizing Provider  acetaminophen (TYLENOL) 500 MG tablet Take 500 mg by mouth every 6 (six) hours as needed. For bad headache     Historical Provider, MD  benzonatate (TESSALON) 100 MG capsule TK 1 C PO Q 8 H PRF COUGH 04/28/15   Historical Provider, MD  buPROPion (WELLBUTRIN SR) 200 MG 12 hr tablet Take 200 mg by mouth 2 (two) times daily.      Historical Provider, MD  busPIRone (BUSPAR) 15 MG tablet Take 15 mg by mouth.    Historical Provider, MD  cyclobenzaprine (FLEXERIL) 10 MG tablet once a day 02/19/14   Historical Provider, MD  dicyclomine (BENTYL) 10 MG capsule TK ONE C PO Q 8 H PRF ABDOMINAL PAIN 05/05/15   Historical Provider, MD  doxepin (SINEQUAN) 50 MG capsule  04/21/14   Historical Provider, MD  EPINEPHrine (EPIPEN 2-PAK) 0.3 mg/0.3 mL IJ SOAJ injection  05/06/14   Historical Provider, MD  fluticasone (FLONASE) 50 MCG/ACT nasal spray   05/06/14   Historical Provider, MD  furosemide (LASIX) 20 MG tablet Take by mouth. 02/16/15 02/16/16  Historical Provider, MD  furosemide (LASIX) 40 MG tablet TK 1 T PO QAM 02/28/15   Historical Provider, MD  gabapentin (NEURONTIN) 800 MG tablet Take 1.5 tablets (1,200 mg total) by mouth 3 (three) times daily. 06/27/11   Darrol Jump, MD  HYDROcodone-acetaminophen (NORCO/VICODIN) 5-325 MG per tablet  04/09/13   Historical Provider, MD  hydrOXYzine (ATARAX/VISTARIL) 25 MG tablet Take by mouth. 05/06/14   Historical Provider, MD  ketoconazole (NIZORAL) 2 % cream Apply topically. 08/21/14 08/21/15  Historical Provider, MD  ketorolac (TORADOL) 10 MG tablet Take 1 tablet (10 mg total) by mouth every 6 (six) hours as needed. Patient not taking: Reported on 05/18/2015 01/01/15   III William C Ruffian, PA-C  levocetirizine (XYZAL) 5 MG tablet Take 5 mg by mouth. 05/06/14   Historical Provider, MD  levothyroxine (SYNTHROID, LEVOTHROID) 150 MCG tablet Take 1 tablet (150 mcg total) by mouth daily before breakfast. 06/27/11 05/11/13  Darrol Jump, MD  levothyroxine (SYNTHROID, LEVOTHROID) 300 MCG tablet Take 600 mcg by mouth daily before breakfast.    Historical Provider, MD  methocarbamol (ROBAXIN-750) 750 MG tablet Take 1 tablet (750 mg total) by mouth every 6 (six) hours as needed for muscle spasms. Patient not taking: Reported on 05/18/2015 01/01/15   III William C Ruffian, PA-C  metroNIDAZOLE (FLAGYL) 500 MG tablet TK 1 T PO BID FOR 7 DAYS 05/05/15   Historical Provider, MD  Olopatadine HCl (PATADAY) 0.2 % SOLN  05/06/14   Historical Provider, MD  omeprazole (PRILOSEC) 20 MG capsule Take 20 mg by mouth. 12/27/14 12/27/15  Historical Provider, MD  potassium chloride SA (K-DUR,KLOR-CON) 20 MEQ tablet Take by mouth. 02/16/15 02/16/16  Historical Provider, MD  prazosin (MINIPRESS) 2 MG capsule Take 1 capsule (2 mg total) by mouth at bedtime. 06/27/11 05/11/13  Darrol Jump, MD  PROAIR HFA 108 (90 BASE) MCG/ACT inhaler  INHALE 1 PUFF PO Q 6 H PRF SOB OR WHEEZING 04/28/15   Historical Provider, MD  QUEtiapine (SEROQUEL) 300 MG tablet TK 1 T PO QHS 04/11/15   Historical Provider, MD  ranitidine (ZANTAC) 150 MG capsule  05/06/14   Historical Provider, MD  rivaroxaban (XARELTO) 20 MG TABS tablet Take 20 mg by mouth daily with supper.    Historical Provider, MD  simvastatin (ZOCOR) 20 MG tablet 1 every evening 08/27/14   Historical Provider, MD  SUBOXONE 8-2 MG FILM DOSSOLVE 1 FILM UNDER THE TONGUE QD FOR 7 DAYS 04/25/15   Historical Provider, MD  SUMAtriptan (IMITREX) 50 MG tablet  08/26/14   Historical Provider, MD  tiZANidine (ZANAFLEX) 2 MG tablet TK 1 T PO Q 8 H PRN 05/09/15   Historical Provider, MD  tobramycin-dexamethasone [facility]) ophthalmic solution  INSTILL 2 DROPS IN THE RIGHT EYE Q 4 H WHILE AWAKE FOR 5 DAYS THEN DECREASE TO 1 DROP IN THE RIGHT EYE Q 6 HOURS FOR 5 DAYS 04/11/15   Historical Provider, MD  topiramate (TOPAMAX) 200 MG tablet Take 100 mg by mouth daily. 06/27/11 05/11/13  Darrol Jump, MD  traMADol (ULTRAM) 50 MG tablet Take 50 mg by mouth. 06/08/14   Historical Provider, MD  triamcinolone cream (KENALOG) 0.1 % Apply topically. 02/04/14   Historical Provider, MD  trimethoprim-polymyxin b (POLYTRIM) ophthalmic solution Place 2 drops into the right eye every 4 (four) hours. Patient not taking: Reported on 05/18/2015 03/27/15   Pierce Crane Beers, PA-C  VIIBRYD 10 MG TABS TK 1 T PO D 04/21/15   Historical Provider, MD  Vitamin D, Ergocalciferol, (DRISDOL) 50000 UNITS CAPS capsule Take by mouth. 11/12/14 11/12/15  Historical Provider, MD   BP 112/65 mmHg  Pulse 75  Temp(Src) 98.4 F (36.9 C) (Oral)  Resp 18  Ht 5\' 7"  (1.702 m)  Wt 245 lb (111.131 kg)  BMI 38.36 kg/m2  SpO2 95% Physical Exam  Constitutional: She is oriented to person, place, and time. She appears well-developed and well-nourished. No distress.  HENT:  Head: Normocephalic and atraumatic.  Mouth/Throat: Oropharynx is clear and  moist.  Eyes: EOM are normal. Pupils are equal, round, and reactive to light. Right eye exhibits no discharge. Left eye exhibits no discharge.  Neck: Normal range of motion. Neck supple.  Cardiovascular: Normal rate, regular rhythm and intact distal pulses.   Pulmonary/Chest: Effort normal and breath sounds normal. No respiratory distress. She exhibits no tenderness.  Abdominal: Soft. She exhibits no distension. There is no tenderness.  Musculoskeletal:  Cervical Spine: Examination of the cervical spine reveals no bony abnormality, no edema, and no ecchymosis.  There is no step-off.  The patient has full active and passive range of motion of the cervical spine with flexion, extension, and right and left bend with rotation.  There is no crepitus with range of motion exercises. Patient has increase tightness and pulling sensation with right rotation and bend.  The patient is non-tender along the spinous process to palpation.  The patient has left paravertebral muscle tenderness along the superior scapular border up to the trapezius and to the base of the skull..  There is superior parascapular discomfort.  The patient has a negative axial compression test.  The patient has a negative Spurling test.  The patient has a negative overhead arm test for thoracic outlet syndrome.    Bilateral Upper Extremity: Examination of the bilateral shoulder and arm showed no bony abnormality or edema.  The patient has normal active and passive motion with abduction, flexion, internal rotation, and external rotation.  The patient has no tenderness with motion.  The patient has a negative Hawkins test and a negative impingement test.  The patient has a negative drop arm test.  The patient is non-tender along the deltoid muscle.  There is no subacromial space tenderness with no AC joint tenderness.  The patient has no instability of the shoulder with anterior-posterior motion.  There is a negative sulcus sign.  The rotator cuff  muscle strength is 5/5 with supraspinatus, 5/5 with internal rotation, and 5/5 with external rotation.  There is no crepitus with range of motion activities.     Neurological: She is alert and oriented to person, place, and time. She has normal reflexes.  Skin: Skin is warm and dry.  Psychiatric: She has a normal mood  and affect. Her behavior is normal. Thought content normal.    ED Course  Procedures (including critical care time) Labs Review Labs Reviewed - No data to display  Imaging Review No results found. I have personally reviewed and evaluated these images and lab results as part of my medical decision-making.   EKG Interpretation None      MDM   Final diagnoses:  Cervical paraspinal muscle spasm    44 year old female with 3 days of left cervical muscle tightness. No spinous process tenderness. Vital signs stable. No radicular symptoms or neurological deficits in the upper extremities. Patient will continue with tizanidine. Continue with 6 day prednisone taper which was initiated to date. Patient was given cervical stretching exercises to work on over the next few days. Return to the ER for any worsening symptoms urgent changes in health.    Duanne Guess, PA-C 05/29/15 [place], MD 05/29/15 [phone]

## 2015-05-29 NOTE — ED Notes (Signed)
Pt c/o neck pain and swelling on each side, also having some left ear pain, states symptoms started on Friday night, dx with bronchitis on Thursday

## 2015-06-01 ENCOUNTER — Ambulatory Visit
Admission: RE | Admit: 2015-06-01 | Discharge: 2015-06-01 | Disposition: A | Discharge status: Home / Self Care | Payer: Medicare Other | Source: Ambulatory Visit | Attending: Nurse Practitioner | Admitting: Nurse Practitioner

## 2015-06-01 DIAGNOSIS — Z1231 Encounter for screening mammogram for malignant neoplasm of breast: Secondary | ICD-10-CM | POA: Insufficient documentation

## 2015-06-06 ENCOUNTER — Ambulatory Visit (INDEPENDENT_AMBULATORY_CARE_PROVIDER_SITE_OTHER): Payer: Medicare Other | Admitting: Urology

## 2015-06-06 VITALS — BP 93/64 | HR 64 | Ht 67.0 in | Wt 243.8 lb

## 2015-06-06 DIAGNOSIS — R350 Frequency of micturition: Secondary | ICD-10-CM | POA: Diagnosis not present

## 2015-06-06 DIAGNOSIS — N393 Stress incontinence (female) (male): Secondary | ICD-10-CM

## 2015-06-06 DIAGNOSIS — R351 Nocturia: Secondary | ICD-10-CM | POA: Diagnosis not present

## 2015-06-06 LAB — URINALYSIS, COMPLETE
BILIRUBIN UA: NEGATIVE
Glucose, UA: NEGATIVE
KETONES UA: NEGATIVE
Leukocytes, UA: NEGATIVE
NITRITE UA: NEGATIVE
PH UA: 6.5 (ref 5.0–7.5)
Protein, UA: NEGATIVE
RBC UA: NEGATIVE
SPEC GRAV UA: 1.02 (ref 1.005–1.030)
UUROB: 0.2 mg/dL (ref 0.2–1.0)

## 2015-06-06 LAB — MICROSCOPIC EXAMINATION

## 2015-06-06 NOTE — Progress Notes (Signed)
06/06/2015 9:34 AM   Alexis Christensen 1970-09-06 [id number]  Referring provider: Perrin Maltese, MD [address] [place], [place] [address]  Chief Complaint  Patient presents with  . Urinary Incontinence    discuss surgery    HPI: Ms. Alexis Christensen was assessed by [facility] for stress urinary incontinence and is here for reevaluation Ms. Carll leaks with coughing sneezing bending or lifting and has more coughing spells recently. She has urge incontinence when she holds it too long and they're both significant. She denies enuresis. She consulted 6 pads a day. She has postvoid dribbling  She voids every 30-60 minutes cannot sit through and through are moving. She is ankle edema gets up 4 times a night. To take a diuretic  Her flow was good into a kidney stone years ago. She's not had back surgery. She's get urinary tract infections. She is a borderline diabetic. She's never had a stroke or back surgery. She tends towards loose bowel movements  She's had a hysterectomy and reconsult been medically treated  Importantly she recently had a DVT a few months ago and is on Xarelto but may stop it through her vascular physician  Modifying factors: There are no other modifying factors  Associated signs and symptoms: There are no other associated signs and symptoms Aggravating and relieving factors: There are no other aggravating or relieving factors Severity: Moderate Duration: Persistent   PMH: Past Medical History  Diagnosis Date  . Bipolar 1 disorder ([place])   . Thyroid disease   . Post traumatic stress disorder (PTSD)   . Anxiety   . Depression   . Headache(784.0)   . Stress incontinence   . Urge incontinence   . Heart burn   . Arthritis   . Asthma   . DVT (deep venous thrombosis) ([place])   . Heart murmur   . HLD (hyperlipidemia)   . Adopted   . History of kidney stones     Surgical History: Past Surgical History  Procedure Laterality Date  . Wrist surgery    . Ankle surgery      . Knee arthroscopy    . Tubal ligation    . Cholecystectomy    . Abdominal hysterectomy    . Shoulder surgery Right 2014  . Replacement total knee Right   . Lymph node removal  2015    Neck    Home Medications:    Medication List       This list is accurate as of: 06/06/15  9:34 AM.  Always use your most recent med list.               acetaminophen 500 MG tablet  Commonly known as:  TYLENOL  Take 500 mg by mouth every 6 (six) hours as needed. For bad headache     benzonatate 100 MG capsule  Commonly known as:  TESSALON  TK 1 C PO Q 8 H PRF COUGH     buPROPion 200 MG 12 hr tablet  Commonly known as:  WELLBUTRIN SR  Take 200 mg by mouth 2 (two) times daily.     busPIRone 15 MG tablet  Commonly known as:  BUSPAR  Take 15 mg by mouth.     cyclobenzaprine 10 MG tablet  Commonly known as:  FLEXERIL  once a day     dicyclomine 10 MG capsule  Commonly known as:  BENTYL  TK ONE C PO Q 8 H PRF ABDOMINAL PAIN     doxepin 50 MG capsule  Commonly known as:  SINEQUAN     EPIPEN 2-PAK 0.3 mg/0.3 mL Soaj injection  Generic drug:  EPINEPHrine     fluticasone 50 MCG/ACT nasal spray  Commonly known as:  FLONASE     furosemide 20 MG tablet  Commonly known as:  LASIX  Take by mouth.     furosemide 40 MG tablet  Commonly known as:  LASIX  TK 1 T PO QAM     gabapentin 800 MG tablet  Commonly known as:  NEURONTIN  Take 1.5 tablets (1,200 mg total) by mouth 3 (three) times daily.     HYDROcodone-acetaminophen 5-325 MG tablet  Commonly known as:  NORCO/VICODIN     hydrOXYzine 25 MG tablet  Commonly known as:  ATARAX/VISTARIL  Take by mouth.     ketoconazole 2 % cream  Commonly known as:  NIZORAL  Apply topically.     ketorolac 10 MG tablet  Commonly known as:  TORADOL  Take 1 tablet (10 mg total) by mouth every 6 (six) hours as needed.     levocetirizine 5 MG tablet  Commonly known as:  XYZAL  Take 5 mg by mouth.     levothyroxine 300 MCG tablet  Commonly  known as:  SYNTHROID, LEVOTHROID  Take 600 mcg by mouth daily before breakfast.     levothyroxine 150 MCG tablet  Commonly known as:  SYNTHROID, LEVOTHROID  Take 1 tablet (150 mcg total) by mouth daily before breakfast.     methocarbamol 750 MG tablet  Commonly known as:  ROBAXIN-750  Take 1 tablet (750 mg total) by mouth every 6 (six) hours as needed for muscle spasms.     metroNIDAZOLE 500 MG tablet  Commonly known as:  FLAGYL  TK 1 T PO BID FOR 7 DAYS     omeprazole 20 MG capsule  Commonly known as:  PRILOSEC  Take 20 mg by mouth.     PATADAY 0.2 % Soln  Generic drug:  Olopatadine HCl     potassium chloride SA 20 MEQ tablet  Commonly known as:  K-DUR,KLOR-CON  Take by mouth.     prazosin 2 MG capsule  Commonly known as:  MINIPRESS  Take 1 capsule (2 mg total) by mouth at bedtime.     PROAIR HFA 108 (90 BASE) MCG/ACT inhaler  Generic drug:  albuterol  INHALE 1 PUFF PO Q 6 H PRF SOB OR WHEEZING     QUEtiapine 300 MG tablet  Commonly known as:  SEROQUEL  TK 1 T PO QHS     ranitidine 150 MG capsule  Commonly known as:  ZANTAC     rivaroxaban 20 MG Tabs tablet  Commonly known as:  XARELTO  Take 20 mg by mouth daily with supper.     simvastatin 20 MG tablet  Commonly known as:  ZOCOR  1 every evening     SUBOXONE 8-2 MG Film  Generic drug:  Buprenorphine HCl-Naloxone HCl  DOSSOLVE 1 FILM UNDER THE TONGUE QD FOR 7 DAYS     SUMAtriptan 50 MG tablet  Commonly known as:  IMITREX     tiZANidine 2 MG tablet  Commonly known as:  ZANAFLEX  TK 1 T PO Q 8 H PRN     tobramycin-dexamethasone ophthalmic solution  Commonly known as:  TOBRADEX  INSTILL 2 DROPS IN THE RIGHT EYE Q 4 H WHILE AWAKE FOR 5 DAYS THEN DECREASE TO 1 DROP IN THE RIGHT EYE Q 6 HOURS FOR 5 DAYS  topiramate 200 MG tablet  Commonly known as:  TOPAMAX  Take 100 mg by mouth daily.     traMADol 50 MG tablet  Commonly known as:  ULTRAM  Take 50 mg by mouth.     triamcinolone cream 0.1 %    Commonly known as:  KENALOG  Apply topically.     trimethoprim-polymyxin b ophthalmic solution  Commonly known as:  POLYTRIM  Place 2 drops into the right eye every 4 (four) hours.     VIIBRYD 10 MG Tabs  Generic drug:  Vilazodone HCl  TK 1 T PO D     VIIBRYD 20 MG Tabs  Generic drug:  Vilazodone HCl  TK 1 T PO QD     Vitamin D (Ergocalciferol) 50000 UNITS Caps capsule  Commonly known as:  DRISDOL  Take by mouth.        Allergies:  Allergies  Allergen Reactions  . Geodon [Ziprasidone Hydrochloride] Other (See Comments)    Numbness ,sob, headaches, blurred vision  . Ziprasidone Hcl Anaphylaxis    Other reaction(s): Other (See Comments), Unknown Numbness ,sob, headaches, blurred vision Numbness ,sob, headaches, blurred vision  . Sulfa Antibiotics Hives  . Ace Inhibitors Rash  . Aripiprazole Rash  . Erythromycin Rash  . Hydromorphone Rash  . Penicillins Rash    Family History: Family History  Problem Relation Age of Onset  . Adopted: Yes    Social History:  reports that she has quit smoking. She has never used smokeless tobacco. She reports that she drinks alcohol. She reports that she does not use illicit drugs.  ROS: UROLOGY Frequent Urination?: Yes Hard to postpone urination?: Yes Burning/pain with urination?: No Get up at night to urinate?: Yes Leakage of urine?: Yes Urine stream starts and stops?: No Trouble starting stream?: No Do you have to strain to urinate?: No Blood in urine?: No Urinary tract infection?: No Sexually transmitted disease?: No Injury to kidneys or bladder?: No Painful intercourse?: No Weak stream?: No Currently pregnant?: No Vaginal bleeding?: No Last menstrual period?: N  Gastrointestinal Nausea?: No Vomiting?: No Indigestion/heartburn?: No Diarrhea?: No Constipation?: No  Constitutional Fever: No Night sweats?: No Weight loss?: No Fatigue?: No  Skin Skin rash/lesions?: No Itching?: No  Eyes Blurred  vision?: No Double vision?: No  Ears/Nose/Throat Sore throat?: No Sinus problems?: Yes  Hematologic/Lymphatic Swollen glands?: No Easy bruising?: Yes  Cardiovascular Leg swelling?: Yes Chest pain?: No  Respiratory Cough?: No Shortness of breath?: Yes  Endocrine Excessive thirst?: No  Musculoskeletal Back pain?: No Joint pain?: Yes  Neurological Headaches?: Yes Dizziness?: No  Psychologic Depression?: Yes Anxiety?: Yes  Physical Exam: BP 93/64 mmHg  Pulse 64  Ht 5\' 7"  (1.702 m)  Wt 243 lb 12.8 oz (110.587 kg)  BMI 38.18 kg/m2  Constitutional:  Alert and oriented, No acute distress. HEENT: [place] AT, moist mucus membranes.  Trachea midline, no masses. Cardiovascular: No clubbing, cyanosis, or edema. Respiratory: Normal respiratory effort, no increased work of breathing. GI: Abdomen is soft, nontender, nondistended, no abdominal masses GU: No CVA tenderness. Grade 2 hypermobility of the bladder neck and no stress incontinence or prolapse Skin: No rashes, bruises or suspicious lesions. Lymph: No cervical or inguinal adenopathy. Neurologic: Grossly intact, no focal deficits, moving all 4 extremities. Psychiatric: Normal mood and affect.  Laboratory Data: Lab Results  Component Value Date   WBC 8.5 05/26/2015   HGB 11.4* 05/26/2015   HCT 35.6 05/26/2015   MCV 83.0 05/26/2015   PLT 295 05/26/2015  Lab Results  Component Value Date   CREATININE 0.68 05/26/2015     Urinalysis    Component Value Date/Time   COLORURINE YELLOW 06/18/2011 1258   APPEARANCEUR CLOUDY* 06/18/2011 1258   LABSPEC 1.023 06/18/2011 1258   PHURINE 6.0 06/18/2011 1258   GLUCOSEU Negative 05/18/2015 0848   HGBUR NEGATIVE 06/18/2011 1258   BILIRUBINUR Negative 05/18/2015 0848   BILIRUBINUR NEGATIVE 06/18/2011 [place] 06/18/2011 1258   PROTEINUR NEGATIVE 06/18/2011 1258   UROBILINOGEN 1.0 06/18/2011 1258   NITRITE Negative 05/18/2015 0848   NITRITE NEGATIVE  06/18/2011 1258   LEUKOCYTESUR Negative 05/18/2015 0848   LEUKOCYTESUR NEGATIVE 06/18/2011 1258    Pertinent Imaging: None  Assessment & Plan:  The patient has mixed stress urge incontinence. Both are significant. She has impressive nocturia and frequency. The role of urodynamics discussed.  1. SUI (stress urinary incontinence, female) 2. Mixed urinary incontinence 3. Increased frequency 4. Nocturia - Urinalysis, Complete    Reece Packer, MD  [facility] [address], [place], [place] [address] [phone]

## 2015-06-08 LAB — CULTURE, URINE COMPREHENSIVE

## 2015-06-21 ENCOUNTER — Other Ambulatory Visit: Payer: Self-pay | Admitting: Ophthalmology

## 2015-06-21 DIAGNOSIS — H538 Other visual disturbances: Secondary | ICD-10-CM

## 2015-06-28 ENCOUNTER — Ambulatory Visit
Admission: RE | Admit: 2015-06-28 | Discharge: 2015-06-28 | Disposition: A | Discharge status: Home / Self Care | Payer: Medicare Other | Source: Ambulatory Visit | Attending: Ophthalmology | Admitting: Ophthalmology

## 2015-06-28 DIAGNOSIS — H538 Other visual disturbances: Secondary | ICD-10-CM | POA: Insufficient documentation

## 2015-06-28 DIAGNOSIS — H0520 Unspecified exophthalmos: Secondary | ICD-10-CM | POA: Diagnosis not present

## 2015-06-28 MED ORDER — IOHEXOL 300 MG/ML  SOLN
75.0000 mL | Freq: Once | INTRAMUSCULAR | Status: AC | PRN
  Administered 2015-06-28: 75 mL via INTRAVENOUS

## 2015-07-01 ENCOUNTER — Other Ambulatory Visit: Payer: Self-pay | Admitting: Otolaryngology

## 2015-07-01 DIAGNOSIS — H9042 Sensorineural hearing loss, unilateral, left ear, with unrestricted hearing on the contralateral side: Secondary | ICD-10-CM

## 2015-07-12 ENCOUNTER — Encounter: Payer: Self-pay | Admitting: Urology

## 2015-07-13 ENCOUNTER — Encounter: Payer: Self-pay | Admitting: Urology

## 2015-07-25 ENCOUNTER — Other Ambulatory Visit: Payer: Medicare Other

## 2015-07-29 ENCOUNTER — Ambulatory Visit (INDEPENDENT_AMBULATORY_CARE_PROVIDER_SITE_OTHER): Payer: Medicare Other | Admitting: Urology

## 2015-07-29 ENCOUNTER — Encounter: Payer: Self-pay | Admitting: Urology

## 2015-07-29 VITALS — Ht 66.0 in | Wt 250.0 lb

## 2015-07-29 DIAGNOSIS — R32 Unspecified urinary incontinence: Secondary | ICD-10-CM | POA: Diagnosis not present

## 2015-07-29 NOTE — Progress Notes (Signed)
07/29/2015 11:13 AM   Shanica Smitty Cords 1971-06-01 [id number]  Referring provider: Perrin Maltese, MD [address] [place], [place] [address]  Chief Complaint  Patient presents with  . Results    HPI: Ms. Deegan was assessed by [facility] for stress urinary incontinence and is here for reevaluation Ms. Stangl leaks with coughing sneezing bending or lifting and has more coughing spells recently. She has urge incontinence when she holds it too long and they're both significant. She denies enuresis. She consulted 6 pads a day. She has postvoid dribbling She voids every 30-60 minutes cannot sit through and through are moving. She is ankle edema gets up 4 times a night. 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 Bladder capacity was 470 mL. Bladder had low pressure his bili and she did not leak. She had mild stress incontinence at 150 mL at 97 cm of water at 300 mL at 124 cm water she very mild leakage. She generated low pressure contraction during voluntary voiding. EMG activity increased during the voiding phase. Bladder neck dissented approximate 2 cm. There was a lot of artifact noted. She was doing a lot of straining.  PMH: Past Medical History  Diagnosis Date  . Bipolar 1 disorder ([place])   . Thyroid disease   . Post traumatic stress disorder (PTSD)   . Anxiety   . Depression   . Headache(784.0)   . Stress incontinence   . Urge incontinence   . Heart burn   . Arthritis   . Asthma   . DVT (deep venous thrombosis) ([place])   . Heart murmur   . HLD (hyperlipidemia)   . Adopted   . History of kidney stones     Surgical History: Past Surgical History  Procedure Laterality Date  . Wrist surgery    . Ankle surgery    . Knee arthroscopy    . Tubal ligation    . Cholecystectomy    . Abdominal  hysterectomy    . Shoulder surgery Right 2014  . Replacement total knee Right   . Lymph node removal  2015    Neck    Home Medications:    Medication List       This list is accurate as of: 07/29/15 11:13 AM.  Always use your most recent med list.               acetaminophen 500 MG tablet  Commonly known as:  TYLENOL  Take 500 mg by mouth every 6 (six) hours as needed. For bad headache     benzonatate 100 MG capsule  Commonly known as:  TESSALON  TK 1 C PO Q 8 H PRF COUGH     buPROPion 200 MG 12 hr tablet  Commonly known as:  WELLBUTRIN SR  Take 200 mg by mouth 2 (two) times daily.     busPIRone 15 MG tablet  Commonly known as:  BUSPAR  Take 15 mg by mouth.     cyclobenzaprine 10 MG tablet  Commonly known as:  FLEXERIL  once a day     dicyclomine 10 MG capsule  Commonly known as:  BENTYL  TK ONE C PO Q 8 H PRF ABDOMINAL PAIN     doxepin 50 MG capsule  Commonly known as:  SINEQUAN  EPIPEN 2-PAK 0.3 mg/0.3 mL Soaj injection  Generic drug:  EPINEPHrine     fluticasone 50 MCG/ACT nasal spray  Commonly known as:  FLONASE     furosemide 20 MG tablet  Commonly known as:  LASIX  Take by mouth.     furosemide 40 MG tablet  Commonly known as:  LASIX  TK 1 T PO QAM     gabapentin 800 MG tablet  Commonly known as:  NEURONTIN  Take 1.5 tablets (1,200 mg total) by mouth 3 (three) times daily.     HYDROcodone-acetaminophen 5-325 MG tablet  Commonly known as:  NORCO/VICODIN     hydrOXYzine 25 MG tablet  Commonly known as:  ATARAX/VISTARIL  Take by mouth.     ketoconazole 2 % cream  Commonly known as:  NIZORAL  Apply topically.     ketorolac 10 MG tablet  Commonly known as:  TORADOL  Take 1 tablet (10 mg total) by mouth every 6 (six) hours as needed.     levocetirizine 5 MG tablet  Commonly known as:  XYZAL  Take 5 mg by mouth.     levothyroxine 300 MCG tablet  Commonly known as:  SYNTHROID, LEVOTHROID  Take 600 mcg by mouth daily before breakfast.      levothyroxine 150 MCG tablet  Commonly known as:  SYNTHROID, LEVOTHROID  Take 1 tablet (150 mcg total) by mouth daily before breakfast.     methocarbamol 750 MG tablet  Commonly known as:  ROBAXIN-750  Take 1 tablet (750 mg total) by mouth every 6 (six) hours as needed for muscle spasms.     metroNIDAZOLE 500 MG tablet  Commonly known as:  FLAGYL  TK 1 T PO BID FOR 7 DAYS     omeprazole 20 MG capsule  Commonly known as:  PRILOSEC  Take 20 mg by mouth.     PATADAY 0.2 % Soln  Generic drug:  Olopatadine HCl     potassium chloride SA 20 MEQ tablet  Commonly known as:  K-DUR,KLOR-CON  Take by mouth.     prazosin 2 MG capsule  Commonly known as:  MINIPRESS  Take 1 capsule (2 mg total) by mouth at bedtime.     PROAIR HFA 108 (90 Base) MCG/ACT inhaler  Generic drug:  albuterol  INHALE 1 PUFF PO Q 6 H PRF SOB OR WHEEZING     QUEtiapine 300 MG tablet  Commonly known as:  SEROQUEL  TK 1 T PO QHS     ranitidine 150 MG capsule  Commonly known as:  ZANTAC     rivaroxaban 20 MG Tabs tablet  Commonly known as:  XARELTO  Take 20 mg by mouth daily with supper.     simvastatin 20 MG tablet  Commonly known as:  ZOCOR  1 every evening     SUBOXONE 8-2 MG Film  Generic drug:  Buprenorphine HCl-Naloxone HCl  DOSSOLVE 1 FILM UNDER THE TONGUE QD FOR 7 DAYS     SUMAtriptan 50 MG tablet  Commonly known as:  IMITREX     tiZANidine 2 MG tablet  Commonly known as:  ZANAFLEX  TK 1 T PO Q 8 H PRN     tobramycin-dexamethasone ophthalmic solution  Commonly known as:  TOBRADEX  INSTILL 2 DROPS IN THE RIGHT EYE Q 4 H WHILE AWAKE FOR 5 DAYS THEN DECREASE TO 1 DROP IN THE RIGHT EYE Q 6 HOURS FOR 5 DAYS     topiramate 200 MG tablet  Commonly known as:  TOPAMAX  Take 100 mg by mouth daily.     traMADol 50 MG tablet  Commonly known as:  ULTRAM  Take 50 mg by mouth.     triamcinolone cream 0.1 %  Commonly known as:  KENALOG  Apply topically.     trimethoprim-polymyxin b  ophthalmic solution  Commonly known as:  POLYTRIM  Place 2 drops into the right eye every 4 (four) hours.     VIIBRYD 10 MG Tabs  Generic drug:  Vilazodone HCl  TK 1 T PO D     VIIBRYD 20 MG Tabs  Generic drug:  Vilazodone HCl  TK 1 T PO QD     Vitamin D (Ergocalciferol) 50000 units Caps capsule  Commonly known as:  DRISDOL  Take by mouth.        Allergies:  Allergies  Allergen Reactions  . Geodon [Ziprasidone Hydrochloride] Other (See Comments)    Numbness ,sob, headaches, blurred vision  . Ziprasidone Hcl Anaphylaxis    Other reaction(s): Other (See Comments), Unknown Numbness ,sob, headaches, blurred vision Numbness ,sob, headaches, blurred vision  . Sulfa Antibiotics Hives  . Ace Inhibitors Rash  . Aripiprazole Rash  . Erythromycin Rash  . Hydromorphone Rash  . Penicillins Rash    Family History: Family History  Problem Relation Age of Onset  . Adopted: Yes    Social History:  reports that she has quit smoking. She has never used smokeless tobacco. She reports that she drinks alcohol. She reports that she does not use illicit drugs.  ROS: UROLOGY Frequent Urination?: No Hard to postpone urination?: No Burning/pain with urination?: No Get up at night to urinate?: No Leakage of urine?: No Urine stream starts and stops?: No Trouble starting stream?: No Do you have to strain to urinate?: No Blood in urine?: No Urinary tract infection?: No Sexually transmitted disease?: No Injury to kidneys or bladder?: No Painful intercourse?: No Weak stream?: No Currently pregnant?: No Vaginal bleeding?: No Last menstrual period?: n  Gastrointestinal Nausea?: No Vomiting?: No Indigestion/heartburn?: No Diarrhea?: No Constipation?: No  Constitutional Fever: No Night sweats?: No Weight loss?: No Fatigue?: No  Skin Skin rash/lesions?: No Itching?: No  Eyes Blurred vision?: No Double vision?: No  Ears/Nose/Throat Sore throat?: No Sinus problems?:  No  Hematologic/Lymphatic Swollen glands?: No Easy bruising?: No  Cardiovascular Leg swelling?: No Chest pain?: No  Respiratory Cough?: No Shortness of breath?: No  Endocrine Excessive thirst?: No  Musculoskeletal Back pain?: No Joint pain?: No  Neurological Headaches?: No Dizziness?: No  Psychologic Depression?: No Anxiety?: No   Laboratory Data: Lab Results  Component Value Date   WBC 8.5 05/26/2015   HGB 11.4* 05/26/2015   HCT 35.6 05/26/2015   MCV 83.0 05/26/2015   PLT 295 05/26/2015    Lab Results  Component Value Date   CREATININE 0.68 05/26/2015     Urinalysis    Component Value Date/Time   COLORURINE YELLOW 06/18/2011 1258   APPEARANCEUR CLOUDY* 06/18/2011 1258   LABSPEC 1.023 06/18/2011 1258   PHURINE 6.0 06/18/2011 1258   GLUCOSEU Negative 06/06/2015 0902   HGBUR NEGATIVE 06/18/2011 1258   BILIRUBINUR Negative 06/06/2015 0902   BILIRUBINUR NEGATIVE 06/18/2011 1258   [place] 06/18/2011 1258   PROTEINUR NEGATIVE 06/18/2011 1258   UROBILINOGEN 1.0 06/18/2011 1258   NITRITE Negative 06/06/2015 0902   NITRITE NEGATIVE 06/18/2011 1258   LEUKOCYTESUR Negative 06/06/2015 0902   LEUKOCYTESUR NEGATIVE 06/18/2011 1258    Pertinent Imaging: None  Assessment & Plan:  The patient  has mixed stress and urge incontinence with a mild outlet abnormality she'll be treated with medical and behavioral therapy. She does not reacher treatment all a sling is an option for her.  The patient was given Vesicare 5 mg samples and a prescription. See in for 6 weeks. Proceed accordingly   Reece Packer, MD  [facility] [address] [place], [place] [address] [phone]

## 2015-08-10 ENCOUNTER — Ambulatory Visit
Admission: RE | Admit: 2015-08-10 | Discharge: 2015-08-10 | Disposition: A | Discharge status: Home / Self Care | Payer: Medicare Other | Source: Ambulatory Visit | Attending: Otolaryngology | Admitting: Otolaryngology

## 2015-08-10 DIAGNOSIS — H9042 Sensorineural hearing loss, unilateral, left ear, with unrestricted hearing on the contralateral side: Secondary | ICD-10-CM | POA: Diagnosis not present

## 2015-08-10 MED ORDER — GADOBENATE DIMEGLUMINE 529 MG/ML IV SOLN
20.0000 mL | Freq: Once | INTRAVENOUS | Status: AC | PRN
  Administered 2015-08-10: 20 mL via INTRAVENOUS

## 2015-08-13 ENCOUNTER — Encounter: Payer: Self-pay | Admitting: Emergency Medicine

## 2015-08-13 ENCOUNTER — Emergency Department
Admission: EM | Admit: 2015-08-13 | Discharge: 2015-08-13 | Disposition: A | Discharge status: Home / Self Care | Payer: Medicare Other | Attending: Emergency Medicine | Admitting: Emergency Medicine

## 2015-08-13 DIAGNOSIS — Z79899 Other long term (current) drug therapy: Secondary | ICD-10-CM | POA: Insufficient documentation

## 2015-08-13 DIAGNOSIS — Z792 Long term (current) use of antibiotics: Secondary | ICD-10-CM | POA: Diagnosis not present

## 2015-08-13 DIAGNOSIS — Z880 Allergy status to penicillin: Secondary | ICD-10-CM | POA: Insufficient documentation

## 2015-08-13 DIAGNOSIS — Z87891 Personal history of nicotine dependence: Secondary | ICD-10-CM | POA: Diagnosis not present

## 2015-08-13 DIAGNOSIS — Z7952 Long term (current) use of systemic steroids: Secondary | ICD-10-CM | POA: Insufficient documentation

## 2015-08-13 DIAGNOSIS — F419 Anxiety disorder, unspecified: Secondary | ICD-10-CM | POA: Diagnosis not present

## 2015-08-13 DIAGNOSIS — R1030 Lower abdominal pain, unspecified: Secondary | ICD-10-CM | POA: Diagnosis present

## 2015-08-13 LAB — CBC
HEMATOCRIT: 37.9 % (ref 35.0–47.0)
Hemoglobin: 12.2 g/dL (ref 12.0–16.0)
MCH: 26.5 pg (ref 26.0–34.0)
MCHC: 32.2 g/dL (ref 32.0–36.0)
MCV: 82.2 fL (ref 80.0–100.0)
Platelets: 364 10*3/uL (ref 150–440)
RBC: 4.61 MIL/uL (ref 3.80–5.20)
RDW: 15 % — AB (ref 11.5–14.5)
WBC: 6.2 10*3/uL (ref 3.6–11.0)

## 2015-08-13 LAB — URINALYSIS COMPLETE WITH MICROSCOPIC (ARMC ONLY)
BILIRUBIN URINE: NEGATIVE
GLUCOSE, UA: NEGATIVE mg/dL
Hgb urine dipstick: NEGATIVE
Ketones, ur: NEGATIVE mg/dL
Leukocytes, UA: NEGATIVE
Nitrite: NEGATIVE
PH: 7 (ref 5.0–8.0)
Protein, ur: NEGATIVE mg/dL
Specific Gravity, Urine: 1.018 (ref 1.005–1.030)

## 2015-08-13 LAB — COMPREHENSIVE METABOLIC PANEL
ALBUMIN: 3.9 g/dL (ref 3.5–5.0)
ALT: 13 U/L — ABNORMAL LOW (ref 14–54)
AST: 14 U/L — AB (ref 15–41)
Alkaline Phosphatase: 83 U/L (ref 38–126)
Anion gap: 5 (ref 5–15)
BUN: 8 mg/dL (ref 6–20)
CHLORIDE: 103 mmol/L (ref 101–111)
CO2: 30 mmol/L (ref 22–32)
Calcium: 9.2 mg/dL (ref 8.9–10.3)
Creatinine, Ser: 0.79 mg/dL (ref 0.44–1.00)
GFR calc Af Amer: >60 mL/min (ref >60)
GFR calc non Af Amer: >60 mL/min (ref >60)
GLUCOSE: 108 mg/dL — AB (ref 65–99)
POTASSIUM: 4.1 mmol/L (ref 3.5–5.1)
Sodium: 138 mmol/L (ref 135–145)
Total Bilirubin: 0.4 mg/dL (ref 0.3–1.2)
Total Protein: 7.8 g/dL (ref 6.5–8.1)

## 2015-08-13 LAB — LIPASE, BLOOD: LIPASE: 19 U/L (ref 11–51)

## 2015-08-13 MED ORDER — MORPHINE SULFATE (PF) 4 MG/ML IV SOLN
4.0000 mg | Freq: Once | INTRAVENOUS | Status: AC
  Administered 2015-08-13: 4 mg via INTRAVENOUS
  Filled 2015-08-13: qty 1

## 2015-08-13 MED ORDER — ONDANSETRON HCL 4 MG/2ML IJ SOLN
4.0000 mg | Freq: Once | INTRAMUSCULAR | Status: AC
  Administered 2015-08-13: 4 mg via INTRAVENOUS
  Filled 2015-08-13: qty 2

## 2015-08-13 MED ORDER — TRAMADOL HCL 50 MG PO TABS: 50.0000 mg | ORAL_TABLET | Freq: Four times a day (QID) | ORAL | Status: DC | PRN

## 2015-08-13 NOTE — Discharge Instructions (Signed)

## 2015-08-13 NOTE — ED Notes (Signed)
Lower abd pain and n/v, saw her MD and had test done yesterday, states they didn't show anything but not feeling better.

## 2015-08-13 NOTE — ED Provider Notes (Signed)
[facility] Emergency Department Provider Note  ____________________________________________   I have reviewed the triage vital signs and the nursing notes.   HISTORY  Chief Complaint Abdominal Pain    HPI Alexis Christensen is a 45 y.o. female who presents with complaints of supra pubic abdominal pain. Patient reports she developed this pain approximately 5 or 6 days ago. She saw her PCP 4 days ago who gave her antibiotics for possible diverticulitis and a urinalysis which was normal. She then saw her PCP again today days ago because the pain was getting worse. Apparently they did a CT scan in the office but the results of this are unknown and unobtainable at this time. Patient presents to the emergency department because she continues to have pain. She describes it as sharp and cramping in the suprapubic area with some radiation to the left lower quadrant.     Past Medical History  Diagnosis Date  . Bipolar 1 disorder ([place])   . Thyroid disease   . Post traumatic stress disorder (PTSD)   . Anxiety   . Depression   . Headache(784.0)   . Stress incontinence   . Urge incontinence   . Heart burn   . Arthritis   . Asthma   . DVT (deep venous thrombosis) ([place])   . Heart murmur   . HLD (hyperlipidemia)   . Adopted   . History of kidney stones     Patient Active Problem List   Diagnosis Date Noted  . SUI (stress urinary incontinence, female) 05/18/2015  . Pain in shoulder 09/15/2014  . Carpal tunnel syndrome 09/15/2014  . Effusion of knee 12/30/2013  . 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 Gonalgia 12/30/2013  . Lipoma of arm 05/11/2013    Past Surgical History  Procedure Laterality Date  . Wrist surgery    . Ankle surgery    . Knee arthroscopy    . Tubal ligation    . Cholecystectomy    . Abdominal hysterectomy    . Shoulder surgery Right 2014  . Replacement total knee Right   . Lymph node removal  2015    Neck    Current Outpatient Rx   Name  Route  Sig  Dispense  Refill  . acetaminophen (TYLENOL) 500 MG tablet   Oral   Take 500 mg by mouth every 6 (six) hours as needed. For bad headache          . benzonatate (TESSALON) 100 MG capsule      TK 1 C PO Q 8 H PRF COUGH      0   . buPROPion (WELLBUTRIN SR) 200 MG 12 hr tablet   Oral   Take 200 mg by mouth 2 (two) times daily.           . busPIRone (BUSPAR) 15 MG tablet   Oral   Take 15 mg by mouth.         . cyclobenzaprine (FLEXERIL) 10 MG tablet      once a day         . dicyclomine (BENTYL) 10 MG capsule      TK ONE C PO Q 8 H PRF ABDOMINAL PAIN      0   . doxepin (SINEQUAN) 50 MG capsule               . EPINEPHrine (EPIPEN 2-PAK) 0.3 mg/0.3 mL IJ SOAJ injection               .  fluticasone (FLONASE) 50 MCG/ACT nasal spray               . furosemide (LASIX) 20 MG tablet   Oral   Take by mouth.         . furosemide (LASIX) 40 MG tablet      TK 1 T PO QAM      0   . gabapentin (NEURONTIN) 800 MG tablet   Oral   Take 1.5 tablets (1,200 mg total) by mouth 3 (three) times daily.   90 tablet   0   . HYDROcodone-acetaminophen (NORCO/VICODIN) 5-325 MG per tablet               . hydrOXYzine (ATARAX/VISTARIL) 25 MG tablet   Oral   Take by mouth.         Marland Kitchen ketoconazole (NIZORAL) 2 % cream   Topical   Apply topically.         Marland Kitchen ketorolac (TORADOL) 10 MG tablet   Oral   Take 1 tablet (10 mg total) by mouth every 6 (six) hours as needed.   20 tablet   0   . levocetirizine (XYZAL) 5 MG tablet   Oral   Take 5 mg by mouth.         . EXPIRED: levothyroxine (SYNTHROID, LEVOTHROID) 150 MCG tablet   Oral   Take 1 tablet (150 mcg total) by mouth daily before breakfast.   30 tablet   0   . levothyroxine (SYNTHROID, LEVOTHROID) 300 MCG tablet   Oral   Take 600 mcg by mouth daily before breakfast.         . methocarbamol (ROBAXIN-750) 750 MG tablet   Oral   Take 1 tablet (750 mg total) by mouth every 6 (six)  hours as needed for muscle spasms.   16 tablet   0   . metroNIDAZOLE (FLAGYL) 500 MG tablet      TK 1 T PO BID FOR 7 DAYS      0   . Olopatadine HCl (PATADAY) 0.2 % SOLN               . omeprazole (PRILOSEC) 20 MG capsule   Oral   Take 20 mg by mouth.         . potassium chloride SA (K-DUR,KLOR-CON) 20 MEQ tablet   Oral   Take by mouth.         . EXPIRED: prazosin (MINIPRESS) 2 MG capsule   Oral   Take 1 capsule (2 mg total) by mouth at bedtime.   30 capsule   0   . PROAIR HFA 108 (90 BASE) MCG/ACT inhaler      INHALE 1 PUFF PO Q 6 H PRF SOB OR WHEEZING      0     Dispense as written.   Marland Kitchen QUEtiapine (SEROQUEL) 300 MG tablet      TK 1 T PO QHS      0   . ranitidine (ZANTAC) 150 MG capsule               . rivaroxaban (XARELTO) 20 MG TABS tablet   Oral   Take 20 mg by mouth daily with supper.         . simvastatin (ZOCOR) 20 MG tablet      1 every evening         . SUBOXONE 8-2 MG FILM      DOSSOLVE 1 FILM UNDER THE TONGUE QD FOR 7 DAYS  0     Dispense as written.   . SUMAtriptan (IMITREX) 50 MG tablet               . tiZANidine (ZANAFLEX) 2 MG tablet      TK 1 T PO Q 8 H PRN      1   . tobramycin-dexamethasone (TOBRADEX) ophthalmic solution      INSTILL 2 DROPS IN THE RIGHT EYE Q 4 H WHILE AWAKE FOR 5 DAYS THEN DECREASE TO 1 DROP IN THE RIGHT EYE Q 6 HOURS FOR 5 DAYS      1   . EXPIRED: topiramate (TOPAMAX) 200 MG tablet   Oral   Take 100 mg by mouth daily.         . traMADol (ULTRAM) 50 MG tablet   Oral   Take 50 mg by mouth.         . triamcinolone cream (KENALOG) 0.1 %   Topical   Apply topically.         Marland Kitchen trimethoprim-polymyxin b (POLYTRIM) ophthalmic solution   Right Eye   Place 2 drops into the right eye every 4 (four) hours.   10 mL   0   . VIIBRYD 10 MG TABS      TK 1 T PO D      0     Dispense as written.   Marland Kitchen VIIBRYD 20 MG TABS      TK 1 T PO QD      0     Dispense as  written.   . Vitamin D, Ergocalciferol, (DRISDOL) 50000 UNITS CAPS capsule   Oral   Take by mouth.           Allergies Geodon; Ziprasidone hcl; Sulfa antibiotics; Ace inhibitors; Aripiprazole; Erythromycin; Hydromorphone; and Penicillins  Family History  Problem Relation Age of Onset  . Adopted: Yes    Social History Social History  Substance Use Topics  . Smoking status: Former Smoker -- 1.00 packs/day for 20 years  . Smokeless tobacco: Never Used     Comment: quit 2013  . Alcohol Use: 0.0 oz/week    0 Standard drinks or equivalent per week     Comment: occasional    Review of Systems  Constitutional: Negative for fever. Eyes: Negative for discharge ENT: Negative for sore throat Cardiovascular: Negative for chest pain. Respiratory: Negative for shortness of breath. Negative for cough Gastrointestinal: Negative for diarrhea Genitourinary: Negative for dysuria. Musculoskeletal: Negative for back pain. Skin: Negative for rash. Neurological: Negative for headaches  Psychiatric:Positive for anxiety    ____________________________________________   PHYSICAL EXAM:  VITAL SIGNS: ED Triage Vitals  Enc Vitals Group     BP 08/13/15 1450 119/80 mmHg     Pulse Rate 08/13/15 1450 67     Resp 08/13/15 1450 16     Temp 08/13/15 1450 98.7 F (37.1 C)     Temp src --      SpO2 08/13/15 1450 95 %     Weight 08/13/15 1450 230 lb (104.327 kg)     Height 08/13/15 1450 5\' 6"  (1.676 m)     Head Cir --      Peak Flow --      Pain Score 08/13/15 1450 9     Pain Loc --      Pain Edu? --      Excl. in [place]? --      Constitutional: Alert and oriented. Well appearing and in no distress. Eyes: Conjunctivae are  normal.  ENT   Head: Normocephalic and atraumatic.   Mouth/Throat: Mucous membranes are moist. Cardiovascular: Normal rate, regular rhythm. Normal and symmetric distal pulses are present in all extremities. No murmurs, rubs, or gallops. Respiratory: Normal  respiratory effort without tachypnea nor retractions. Breath sounds are clear and equal bilaterally.  Gastrointestinal: Soft and non-tender in all quadrants. No distention. There is no CVA tenderness. Genitourinary: deferred Musculoskeletal: Nontender with normal range of motion in all extremities. No lower extremity tenderness nor edema. Neurologic:  Normal speech and language. No gross focal neurologic deficits are appreciated. Skin:  Skin is warm, dry and intact. No rash noted. Psychiatric: Mood and affect are normal. Patient exhibits appropriate insight and judgment.  ____________________________________________    LABS (pertinent positives/negatives)  Labs Reviewed  LIPASE, BLOOD  COMPREHENSIVE METABOLIC PANEL  CBC  URINALYSIS COMPLETEWITH MICROSCOPIC ([place])    ____________________________________________   EKG  None  ____________________________________________    RADIOLOGY I have personally reviewed any xrays that were ordered on this patient: None  ____________________________________________   PROCEDURES  Procedure(s) performed: none  Critical Care performed: none  ____________________________________________   INITIAL IMPRESSION / ASSESSMENT AND PLAN / ED COURSE  Pertinent labs & imaging results that were available during my care of the patient were reviewed by me and considered in my medical decision making (see chart for details).  Patient presents with abdominal pain which is superpubic. It sounds like she has had a relatively full workup by her PCP and she presents to the ED today for further evaluation. She did have a CT scan yesterday but the results are not obtainable on a weekend unfortunately. We will check CBC CMP and lipase as well as a urine and then reevaluate  ----------------------------------------- 4:56 PM on 08/13/2015 ----------------------------------------- Lab work is unremarkable. Urine is negative. All vital signs are  unremarkable. Her exam is benign. She is having appropriate outpatient workup and so I do not see the need to repeat any imaging today as her abdominal exam is normal.  Patient agrees with this plan and will return to the emergency department if worsening pain   ____________________________________________   FINAL CLINICAL IMPRESSION(S) / ED DIAGNOSES  Final diagnoses:  Lower abdominal pain     Lavonia Drafts, MD 08/13/15 [phone]

## 2015-08-24 DIAGNOSIS — A048 Other specified bacterial intestinal infections: Secondary | ICD-10-CM

## 2015-08-24 HISTORY — DX: Other specified bacterial intestinal infections: A04.8

## 2015-08-25 ENCOUNTER — Other Ambulatory Visit: Payer: Self-pay | Admitting: Student

## 2015-08-25 DIAGNOSIS — R1032 Left lower quadrant pain: Secondary | ICD-10-CM

## 2015-08-25 DIAGNOSIS — R1031 Right lower quadrant pain: Secondary | ICD-10-CM

## 2015-08-25 DIAGNOSIS — Z8719 Personal history of other diseases of the digestive system: Secondary | ICD-10-CM

## 2015-08-26 ENCOUNTER — Encounter: Payer: Self-pay | Admitting: Urology

## 2015-08-26 ENCOUNTER — Ambulatory Visit (INDEPENDENT_AMBULATORY_CARE_PROVIDER_SITE_OTHER): Payer: Medicare Other | Admitting: Urology

## 2015-08-26 VITALS — BP 102/67 | HR 70 | Ht 64.0 in | Wt 230.2 lb

## 2015-08-26 DIAGNOSIS — N393 Stress incontinence (female) (male): Secondary | ICD-10-CM | POA: Diagnosis not present

## 2015-08-26 LAB — URINALYSIS, COMPLETE
BILIRUBIN UA: NEGATIVE
GLUCOSE, UA: NEGATIVE
KETONES UA: NEGATIVE
Leukocytes, UA: NEGATIVE
Nitrite, UA: NEGATIVE
RBC UA: NEGATIVE
SPEC GRAV UA: 1.025 (ref 1.005–1.030)
UUROB: 0.2 mg/dL (ref 0.2–1.0)
pH, UA: 6 (ref 5.0–7.5)

## 2015-08-26 LAB — MICROSCOPIC EXAMINATION
Epithelial Cells (non renal): >10 /hpf — AB (ref 0–10)
RBC, UA: NONE SEEN /hpf (ref 0–?)

## 2015-08-26 MED ORDER — MIRABEGRON ER 50 MG PO TB24: 50.0000 mg | ORAL_TABLET | Freq: Every day | ORAL | Status: DC

## 2015-08-26 MED ORDER — FESOTERODINE FUMARATE ER 8 MG PO TB24: 8.0000 mg | ORAL_TABLET | Freq: Every day | ORAL | Status: DC

## 2015-08-26 NOTE — Progress Notes (Signed)
08/26/2015 10:29 AM   Alexis Christensen Aug 19, 1970 [id number]  Referring provider: Perrin Maltese, MD [address] [place], [place] [address]  Chief Complaint  Patient presents with  . Urinary Incontinence    stress incontinence    HPI: Alexis Christensen was assessed by Larene Beach for stress urinary incontinence and is here for reevaluation Alexis Christensen leaks with coughing sneezing bending or lifting and has more coughing spells recently. She has urge incontinence when she holds it too long and they're both significant. She denies enuresis. She consulted 6 pads a day. She has postvoid dribbling She voids every 30-60 minutes cannot sit through and through are moving. She is ankle edema gets up 4 times a night. 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 I was considering surgery if she did not reach her treatment goal with medical and behavioral therapy. She's here on Vesicare.   PMH: Past Medical History  Diagnosis Date  . Bipolar 1 disorder ([place])   . Thyroid disease   . Post traumatic stress disorder (PTSD)   . Anxiety   . Depression   . Headache(784.0)   . Stress incontinence   . Urge incontinence   . Heart burn   . Arthritis   . Asthma   . DVT (deep venous thrombosis) ([place])   . Heart murmur   . HLD (hyperlipidemia)   . Adopted   . History of kidney stones   . Effusion of knee 12/30/2013    Surgical History: Past Surgical History  Procedure Laterality Date  . Wrist surgery    . Ankle surgery    . Knee arthroscopy    . Tubal ligation    . Cholecystectomy    . Abdominal hysterectomy    . Shoulder surgery Right 2014  . Replacement total knee Right   . Lymph node removal  2015    Neck    Home Medications:    Medication List       This list is accurate as of: 08/26/15 10:29 AM.  Always use your most recent med list.               acetaminophen 500 MG tablet  Commonly  known as:  TYLENOL  Take 500 mg by mouth every 6 (six) hours as needed. For bad headache     amitriptyline 50 MG tablet  Commonly known as:  ELAVIL  TK 1 T PO QHS     benzonatate 100 MG capsule  Commonly known as:  TESSALON  Reported on 08/26/2015     bismuth subsalicylate 99991111 MG chewable tablet  Commonly known as:  PEPTO BISMOL  Chew by mouth.     buPROPion 200 MG 12 hr tablet  Commonly known as:  WELLBUTRIN SR  Take 200 mg by mouth 2 (two) times daily. Reported on 08/26/2015     busPIRone 15 MG tablet  Commonly known as:  BUSPAR  Take 15 mg by mouth. Reported on 08/26/2015     cyclobenzaprine 10 MG tablet  Commonly known as:  FLEXERIL  once a day     dicyclomine 10 MG capsule  Commonly known as:  BENTYL  TK ONE C PO Q 8 H PRF ABDOMINAL PAIN     doxepin 50 MG capsule  Commonly known as:  SINEQUAN     EPIPEN 2-PAK 0.3 mg/0.3 mL Soaj injection  Generic drug:  EPINEPHrine  fluticasone 50 MCG/ACT nasal spray  Commonly known as:  FLONASE     furosemide 20 MG tablet  Commonly known as:  LASIX  Take by mouth. Reported on 08/26/2015     furosemide 40 MG tablet  Commonly known as:  LASIX  Reported on 08/26/2015     gabapentin 800 MG tablet  Commonly known as:  NEURONTIN  Take 1.5 tablets (1,200 mg total) by mouth 3 (three) times daily.     HYDROcodone-acetaminophen 5-325 MG tablet  Commonly known as:  NORCO/VICODIN  Reported on 08/26/2015     hydrOXYzine 25 MG tablet  Commonly known as:  ATARAX/VISTARIL  Take by mouth.     hyoscyamine 0.125 MG tablet  Commonly known as:  LEVSIN, ANASPAZ     ketorolac 10 MG tablet  Commonly known as:  TORADOL  Take 1 tablet (10 mg total) by mouth every 6 (six) hours as needed.     levocetirizine 5 MG tablet  Commonly known as:  XYZAL  Take 5 mg by mouth.     levothyroxine 300 MCG tablet  Commonly known as:  SYNTHROID, LEVOTHROID  Take 600 mcg by mouth daily before breakfast.     levothyroxine 150 MCG tablet  Commonly  known as:  SYNTHROID, LEVOTHROID  Take 1 tablet (150 mcg total) by mouth daily before breakfast.     methocarbamol 750 MG tablet  Commonly known as:  ROBAXIN-750  Take 1 tablet (750 mg total) by mouth every 6 (six) hours as needed for muscle spasms.     metroNIDAZOLE 500 MG tablet  Commonly known as:  FLAGYL  TK 1 T PO BID FOR 7 DAYS     omeprazole 20 MG capsule  Commonly known as:  PRILOSEC  Take 20 mg by mouth. Reported on 08/26/2015     [facility] OMEPRAZOLE 20 MG Tbec  Generic drug:  Omeprazole  Take by mouth.     ondansetron 4 MG tablet  Commonly known as:  ZOFRAN  Take by mouth.     PATADAY 0.2 % Soln  Generic drug:  Olopatadine HCl     potassium chloride SA 20 MEQ tablet  Commonly known as:  K-DUR,KLOR-CON  Take by mouth.     prazosin 2 MG capsule  Commonly known as:  MINIPRESS  Take 1 capsule (2 mg total) by mouth at bedtime.     PROAIR HFA 108 (90 Base) MCG/ACT inhaler  Generic drug:  albuterol  INHALE 1 PUFF PO Q 6 H PRF SOB OR WHEEZING     QUEtiapine 300 MG tablet  Commonly known as:  SEROQUEL  Reported on 08/26/2015     ranitidine 150 MG capsule  Commonly known as:  ZANTAC     rivaroxaban 20 MG Tabs tablet  Commonly known as:  XARELTO  Take 20 mg by mouth daily with supper.     simvastatin 20 MG tablet  Commonly known as:  ZOCOR  1 every evening     SUBOXONE 8-2 MG Film  Generic drug:  Buprenorphine HCl-Naloxone HCl  DOSSOLVE 1 FILM UNDER THE TONGUE QD FOR 7 DAYS     SUMAtriptan 50 MG tablet  Commonly known as:  IMITREX     tetracycline 500 MG capsule  Commonly known as:  ACHROMYCIN,SUMYCIN  Take by mouth.     tiZANidine 2 MG tablet  Commonly known as:  ZANAFLEX  TK 1 T PO Q 8 H PRN     tobramycin-dexamethasone ophthalmic solution  Commonly known as:  TOBRADEX  Reported  on 08/26/2015     topiramate 200 MG tablet  Commonly known as:  TOPAMAX  Take 100 mg by mouth daily.     traMADol 50 MG tablet  Commonly known as:  ULTRAM  Take 1 tablet  (50 mg total) by mouth every 6 (six) hours as needed.     triamcinolone cream 0.1 %  Commonly known as:  KENALOG  Apply topically. Reported on 08/26/2015     trimethoprim-polymyxin b ophthalmic solution  Commonly known as:  POLYTRIM  Place 2 drops into the right eye every 4 (four) hours.     VIIBRYD 10 MG Tabs  Generic drug:  Vilazodone HCl  Reported on 08/26/2015     VIIBRYD 20 MG Tabs  Generic drug:  Vilazodone HCl  TK 1 T PO QD     Vitamin D (Ergocalciferol) 50000 units Caps capsule  Commonly known as:  DRISDOL  Take by mouth. Reported on 08/26/2015        Allergies:  Allergies  Allergen Reactions  . Geodon [Ziprasidone Hydrochloride] Other (See Comments)    Numbness ,sob, headaches, blurred vision  . Ziprasidone Hcl Anaphylaxis    Other reaction(s): Other (See Comments), Unknown Numbness ,sob, headaches, blurred vision Numbness ,sob, headaches, blurred vision  . Sulfa Antibiotics Hives  . Ace Inhibitors Rash  . Aripiprazole Rash  . Erythromycin Rash and Hives  . Hydromorphone Rash  . Penicillins Rash    Family History: Family History  Problem Relation Age of Onset  . Adopted: Yes    Social History:  reports that she has quit smoking. She has never used smokeless tobacco. She reports that she drinks alcohol. She reports that she does not use illicit drugs.  ROS: UROLOGY Frequent Urination?: Yes Hard to postpone urination?: Yes Burning/pain with urination?: No Get up at night to urinate?: Yes Leakage of urine?: Yes Urine stream starts and stops?: No Trouble starting stream?: No Do you have to strain to urinate?: No Blood in urine?: No Urinary tract infection?: No Sexually transmitted disease?: No Injury to kidneys or bladder?: No Painful intercourse?: No Weak stream?: No Currently pregnant?: No Vaginal bleeding?: No Last menstrual period?: No  Gastrointestinal Nausea?: Yes Vomiting?: Yes Indigestion/heartburn?: No Diarrhea?: Yes Constipation?:  No  Constitutional Fever: No Night sweats?: No Weight loss?: No Fatigue?: No  Skin Skin rash/lesions?: No Itching?: Yes  Eyes Blurred vision?: Yes Double vision?: No  Ears/Nose/Throat Sore throat?: No Sinus problems?: No  Hematologic/Lymphatic Swollen glands?: No Easy bruising?: Yes  Cardiovascular Leg swelling?: Yes Chest pain?: No  Respiratory Cough?: No Shortness of breath?: No  Endocrine Excessive thirst?: No  Musculoskeletal Back pain?: No Joint pain?: Yes  Neurological Headaches?: Yes Dizziness?: No  Psychologic Depression?: Yes Anxiety?: Yes  Physical Exam:  Laboratory Data: Lab Results  Component Value Date   WBC 6.2 08/13/2015   HGB 12.2 08/13/2015   HCT 37.9 08/13/2015   MCV 82.2 08/13/2015   PLT 364 08/13/2015    Lab Results  Component Value Date   CREATININE 0.79 08/13/2015    No results found for: PSA  No results found for: TESTOSTERONE  No results found for: HGBA1C  Urinalysis    Component Value Date/Time   COLORURINE YELLOW* 08/13/2015 1455   APPEARANCEUR HAZY* 08/13/2015 1455   LABSPEC 1.018 08/13/2015 1455   PHURINE 7.0 08/13/2015 1455   GLUCOSEU NEGATIVE 08/13/2015 [place] 08/13/2015 1455   BILIRUBINUR NEGATIVE 08/13/2015 1455   BILIRUBINUR Negative 06/06/2015 0902   KETONESUR NEGATIVE 08/13/2015 1455  PROTEINUR NEGATIVE 08/13/2015 1455   UROBILINOGEN 1.0 06/18/2011 1258   NITRITE NEGATIVE 08/13/2015 1455   NITRITE Negative 06/06/2015 0902   LEUKOCYTESUR NEGATIVE 08/13/2015 1455   LEUKOCYTESUR Negative 06/06/2015 0902    Pertinent Imaging: None  Assessment & Plan:  The patient did not respond to Vesicare. She has stable frequency and incontinence. Toviaz 8 mg samples and prescription given. Mirapex ring 50 mg samples number #given. Seeing 2 months and discuss the role of surgery. Reconsider DVT  1. SUI (stress urinary incontinence, female) 2. Urinary frequency    No Follow-up on  file.  Reece Packer, MD  [facility] [address], [place], [place] [address] [phone]

## 2015-08-30 ENCOUNTER — Other Ambulatory Visit: Payer: Self-pay | Admitting: Orthopedic Surgery

## 2015-08-30 DIAGNOSIS — M2392 Unspecified internal derangement of left knee: Secondary | ICD-10-CM

## 2015-08-30 DIAGNOSIS — M25562 Pain in left knee: Secondary | ICD-10-CM

## 2015-09-05 DIAGNOSIS — F3162 Bipolar disorder, current episode mixed, moderate: Secondary | ICD-10-CM | POA: Insufficient documentation

## 2015-09-07 DIAGNOSIS — E669 Obesity, unspecified: Secondary | ICD-10-CM | POA: Insufficient documentation

## 2015-09-07 DIAGNOSIS — E559 Vitamin D deficiency, unspecified: Secondary | ICD-10-CM | POA: Insufficient documentation

## 2015-09-07 DIAGNOSIS — K529 Noninfective gastroenteritis and colitis, unspecified: Secondary | ICD-10-CM

## 2015-09-07 DIAGNOSIS — E039 Hypothyroidism, unspecified: Secondary | ICD-10-CM | POA: Insufficient documentation

## 2015-09-07 DIAGNOSIS — E66812 Obesity, class 2: Secondary | ICD-10-CM | POA: Insufficient documentation

## 2015-09-07 DIAGNOSIS — R634 Abnormal weight loss: Secondary | ICD-10-CM | POA: Insufficient documentation

## 2015-09-07 DIAGNOSIS — R509 Fever, unspecified: Secondary | ICD-10-CM | POA: Insufficient documentation

## 2015-09-07 HISTORY — DX: Noninfective gastroenteritis and colitis, unspecified: K52.9

## 2015-09-07 HISTORY — DX: Abnormal weight loss: R63.4

## 2015-09-15 ENCOUNTER — Ambulatory Visit: Admission: RE | Admit: 2015-09-15 | Payer: Medicare Other | Source: Ambulatory Visit

## 2015-09-19 ENCOUNTER — Ambulatory Visit
Admission: RE | Admit: 2015-09-19 | Discharge: 2015-09-19 | Disposition: A | Discharge status: Home / Self Care | Payer: Medicare Other | Source: Ambulatory Visit | Attending: Orthopedic Surgery | Admitting: Orthopedic Surgery

## 2015-09-19 DIAGNOSIS — M948X6 Other specified disorders of cartilage, lower leg: Secondary | ICD-10-CM | POA: Insufficient documentation

## 2015-09-19 DIAGNOSIS — M2392 Unspecified internal derangement of left knee: Secondary | ICD-10-CM | POA: Diagnosis present

## 2015-09-19 DIAGNOSIS — M2242 Chondromalacia patellae, left knee: Secondary | ICD-10-CM | POA: Insufficient documentation

## 2015-09-19 DIAGNOSIS — M25562 Pain in left knee: Secondary | ICD-10-CM

## 2015-10-13 ENCOUNTER — Encounter: Payer: Self-pay | Admitting: *Deleted

## 2015-10-13 ENCOUNTER — Other Ambulatory Visit: Payer: Medicare Other

## 2015-10-13 NOTE — Patient Instructions (Signed)
  Your procedure is scheduled on: 10-18-15 (TUESDAY) Report to [facility] To find out your arrival time please call [phone] between 1PM - 3PM on 10-17-15 (MONDAY)  Remember: Instructions that are not followed completely may result in serious medical risk, up to and including death, or upon the discretion of your surgeon and anesthesiologist your surgery may need to be rescheduled.    _X___ 1. Do not eat food or drink liquids after midnight. No gum chewing or hard candies.     _X___ 2. No Alcohol for 24 hours before or after surgery.   ____ 3. Bring all medications with you on the day of surgery if instructed.    _X___ 4. Notify your doctor if there is any change in your medical condition     (cold, fever, infections).     Do not wear jewelry, make-up, hairpins, clips or nail polish.  Do not wear lotions, powders, or perfumes. You may wear deodorant.  Do not shave 48 hours prior to surgery. Men may shave face and neck.  Do not bring valuables to the hospital.    [facility] is not responsible for any belongings or valuables.               Contacts, dentures or bridgework may not be worn into surgery.  Leave your suitcase in the car. After surgery it may be brought to your room.  For patients admitted to the hospital, discharge time is determined by your treatment team.   Patients discharged the day of surgery will not be allowed to drive home.   Please read over the following fact sheets that you were given:     __X__ Take these medicines the morning of surgery with A SIP OF WATER:    1. LEVOTHYROXINE  2. SUBOXONE  3. GABAPENTIN  4. VIBBRYD  5.  6.  ____ Fleet Enema (as directed)   __X__ Use CHG Soap as directed  ____ Use inhalers on the day of surgery  ____ Stop metformin 2 days prior to surgery    ____ Take 1/2 of usual insulin dose the night before surgery and none on the morning of surgery.   ____ Stop  Coumadin/Plavix/aspirin-N/A  ____ Stop Anti-inflammatories-NO NSAIDS OR ASPIRIN PRODUCTS-TYLENOL OK TO TAKE   ____ Stop supplements until after surgery.    ____ Bring C-Pap to the hospital.

## 2015-10-14 ENCOUNTER — Encounter
Admission: RE | Admit: 2015-10-14 | Discharge: 2015-10-14 | Disposition: A | Discharge status: Home / Self Care | Payer: Medicare Other | Source: Ambulatory Visit | Attending: Orthopedic Surgery | Admitting: Orthopedic Surgery

## 2015-10-14 DIAGNOSIS — Z01812 Encounter for preprocedural laboratory examination: Secondary | ICD-10-CM | POA: Diagnosis present

## 2015-10-14 LAB — SURGICAL PCR SCREEN
MRSA, PCR: NEGATIVE
STAPHYLOCOCCUS AUREUS: NEGATIVE

## 2015-10-17 NOTE — Pre-Procedure Instructions (Signed)
Received clearance note from Dr Neoma Laming on 10-14-15 - Low Risk- On chart

## 2015-10-17 NOTE — Pre-Procedure Instructions (Signed)
Called dr Kayleen Memos regarding Suboxone and if pt needed to take this the morning of her surgery.  Dr Kayleen Memos talked it over with Dr Andree Elk and I was instructed to tell pt to take am of surgery.

## 2015-10-18 ENCOUNTER — Ambulatory Visit
Admission: RE | Admit: 2015-10-18 | Discharge: 2015-10-18 | Disposition: A | Discharge status: Home / Self Care | Payer: Medicare Other | Source: Ambulatory Visit | Attending: Orthopedic Surgery | Admitting: Orthopedic Surgery

## 2015-10-18 ENCOUNTER — Encounter: Payer: Self-pay | Admitting: *Deleted

## 2015-10-18 ENCOUNTER — Encounter
Admission: RE | Disposition: A | Discharge status: Home / Self Care | Payer: Self-pay | Source: Ambulatory Visit | Attending: Orthopedic Surgery

## 2015-10-18 ENCOUNTER — Ambulatory Visit: Payer: Medicare Other | Admitting: Anesthesiology

## 2015-10-18 DIAGNOSIS — Z888 Allergy status to other drugs, medicaments and biological substances status: Secondary | ICD-10-CM | POA: Diagnosis not present

## 2015-10-18 DIAGNOSIS — Z811 Family history of alcohol abuse and dependence: Secondary | ICD-10-CM | POA: Diagnosis not present

## 2015-10-18 DIAGNOSIS — D649 Anemia, unspecified: Secondary | ICD-10-CM | POA: Diagnosis not present

## 2015-10-18 DIAGNOSIS — E039 Hypothyroidism, unspecified: Secondary | ICD-10-CM | POA: Diagnosis not present

## 2015-10-18 DIAGNOSIS — F319 Bipolar disorder, unspecified: Secondary | ICD-10-CM | POA: Insufficient documentation

## 2015-10-18 DIAGNOSIS — E785 Hyperlipidemia, unspecified: Secondary | ICD-10-CM | POA: Insufficient documentation

## 2015-10-18 DIAGNOSIS — M794 Hypertrophy of (infrapatellar) fat pad: Secondary | ICD-10-CM | POA: Insufficient documentation

## 2015-10-18 DIAGNOSIS — Z881 Allergy status to other antibiotic agents status: Secondary | ICD-10-CM | POA: Insufficient documentation

## 2015-10-18 DIAGNOSIS — Z96651 Presence of right artificial knee joint: Secondary | ICD-10-CM | POA: Insufficient documentation

## 2015-10-18 DIAGNOSIS — G43909 Migraine, unspecified, not intractable, without status migrainosus: Secondary | ICD-10-CM | POA: Insufficient documentation

## 2015-10-18 DIAGNOSIS — X58XXXA Exposure to other specified factors, initial encounter: Secondary | ICD-10-CM | POA: Insufficient documentation

## 2015-10-18 DIAGNOSIS — J45909 Unspecified asthma, uncomplicated: Secondary | ICD-10-CM | POA: Insufficient documentation

## 2015-10-18 DIAGNOSIS — Z87442 Personal history of urinary calculi: Secondary | ICD-10-CM | POA: Diagnosis not present

## 2015-10-18 DIAGNOSIS — Z882 Allergy status to sulfonamides status: Secondary | ICD-10-CM | POA: Diagnosis not present

## 2015-10-18 DIAGNOSIS — F419 Anxiety disorder, unspecified: Secondary | ICD-10-CM | POA: Diagnosis not present

## 2015-10-18 DIAGNOSIS — Y939 Activity, unspecified: Secondary | ICD-10-CM | POA: Insufficient documentation

## 2015-10-18 DIAGNOSIS — F17200 Nicotine dependence, unspecified, uncomplicated: Secondary | ICD-10-CM | POA: Insufficient documentation

## 2015-10-18 DIAGNOSIS — S83012A Lateral subluxation of left patella, initial encounter: Secondary | ICD-10-CM | POA: Insufficient documentation

## 2015-10-18 DIAGNOSIS — Z880 Allergy status to penicillin: Secondary | ICD-10-CM | POA: Insufficient documentation

## 2015-10-18 DIAGNOSIS — Z9049 Acquired absence of other specified parts of digestive tract: Secondary | ICD-10-CM | POA: Insufficient documentation

## 2015-10-18 DIAGNOSIS — Z90710 Acquired absence of both cervix and uterus: Secondary | ICD-10-CM | POA: Insufficient documentation

## 2015-10-18 DIAGNOSIS — Z79899 Other long term (current) drug therapy: Secondary | ICD-10-CM | POA: Insufficient documentation

## 2015-10-18 DIAGNOSIS — Z86718 Personal history of other venous thrombosis and embolism: Secondary | ICD-10-CM | POA: Insufficient documentation

## 2015-10-18 HISTORY — DX: Gastro-esophageal reflux disease without esophagitis: K21.9

## 2015-10-18 HISTORY — DX: Hypothyroidism, unspecified: E03.9

## 2015-10-18 HISTORY — DX: Family history of other specified conditions: Z84.89

## 2015-10-18 HISTORY — DX: Personal history of Methicillin resistant Staphylococcus aureus infection: Z86.14

## 2015-10-18 HISTORY — PX: KNEE ARTHROSCOPY WITH LATERAL RELEASE: SHX5649

## 2015-10-18 HISTORY — DX: Anemia, unspecified: D64.9

## 2015-10-18 HISTORY — DX: Localized edema: R60.0

## 2015-10-18 HISTORY — DX: Chronic kidney disease, unspecified: N18.9

## 2015-10-18 LAB — POCT I-STAT 4, (NA,K, GLUC, HGB,HCT)
GLUCOSE: 143 mg/dL — AB (ref 65–99)
HEMATOCRIT: 38 % (ref 36.0–46.0)
HEMOGLOBIN: 12.9 g/dL (ref 12.0–15.0)
Potassium: 3.5 mmol/L (ref 3.5–5.1)
Sodium: 138 mmol/L (ref 135–145)

## 2015-10-18 LAB — GLUCOSE, CAPILLARY
GLUCOSE-CAPILLARY: 139 mg/dL — AB (ref 65–99)
GLUCOSE-CAPILLARY: 124 mg/dL — AB (ref 65–99)

## 2015-10-18 SURGERY — ARTHROSCOPY, KNEE, WITH LATERAL RETINACULUM RELEASE
Anesthesia: General | Site: Knee | Laterality: Left | Wound class: Clean

## 2015-10-18 MED ORDER — ONDANSETRON HCL 4 MG PO TABS: 4.0000 mg | ORAL_TABLET | Freq: Four times a day (QID) | ORAL | Status: DC | PRN

## 2015-10-18 MED ORDER — BUPIVACAINE-EPINEPHRINE (PF) 0.5% -1:200000 IJ SOLN
INTRAMUSCULAR | Status: AC
  Filled 2015-10-18: qty 30

## 2015-10-18 MED ORDER — METOCLOPRAMIDE HCL 10 MG PO TABS
5.0000 mg | ORAL_TABLET | Freq: Three times a day (TID) | ORAL | Status: DC | PRN
Start: 2015-10-18 — End: 2015-10-18

## 2015-10-18 MED ORDER — BUPIVACAINE-EPINEPHRINE (PF) 0.5% -1:200000 IJ SOLN
INTRAMUSCULAR | Status: DC | PRN
  Administered 2015-10-18: 30 mL

## 2015-10-18 MED ORDER — MIDAZOLAM HCL 2 MG/2ML IJ SOLN
INTRAMUSCULAR | Status: DC | PRN
Start: 2015-10-18 — End: 2015-10-18
  Administered 2015-10-18: 2 mg via INTRAVENOUS

## 2015-10-18 MED ORDER — DEXAMETHASONE SODIUM PHOSPHATE 10 MG/ML IJ SOLN
INTRAMUSCULAR | Status: DC | PRN
  Administered 2015-10-18: 10 mg via INTRAVENOUS

## 2015-10-18 MED ORDER — FAMOTIDINE 20 MG PO TABS
20.0000 mg | ORAL_TABLET | Freq: Once | ORAL | Status: AC
  Administered 2015-10-18: 20 mg via ORAL

## 2015-10-18 MED ORDER — SODIUM CHLORIDE 0.9 % IV SOLN
INTRAVENOUS | Status: DC
Start: 2015-10-18 — End: 2015-10-18

## 2015-10-18 MED ORDER — METOCLOPRAMIDE HCL 5 MG/ML IJ SOLN
5.0000 mg | Freq: Three times a day (TID) | INTRAMUSCULAR | Status: DC | PRN
Start: 2015-10-18 — End: 2015-10-18

## 2015-10-18 MED ORDER — ONDANSETRON HCL 4 MG/2ML IJ SOLN: 4.0000 mg | Freq: Four times a day (QID) | INTRAMUSCULAR | Status: DC | PRN

## 2015-10-18 MED ORDER — PROPOFOL 10 MG/ML IV BOLUS
INTRAVENOUS | Status: DC | PRN
  Administered 2015-10-18: 150 mg via INTRAVENOUS

## 2015-10-18 MED ORDER — FAMOTIDINE 20 MG PO TABS
ORAL_TABLET | ORAL | Status: AC
  Filled 2015-10-18: qty 1

## 2015-10-18 MED ORDER — HYDROCODONE-ACETAMINOPHEN 5-325 MG PO TABS: 1.0000 | ORAL_TABLET | Freq: Four times a day (QID) | ORAL | Status: DC | PRN

## 2015-10-18 MED ORDER — SODIUM CHLORIDE 0.9 % IV SOLN
INTRAVENOUS | Status: DC
Start: 2015-10-18 — End: 2015-10-18
  Administered 2015-10-18: 10:00:00 via INTRAVENOUS

## 2015-10-18 MED ORDER — HYDROCODONE-ACETAMINOPHEN 5-325 MG PO TABS: 1.0000 | ORAL_TABLET | ORAL | Status: DC | PRN

## 2015-10-18 MED ORDER — ONDANSETRON HCL 4 MG/2ML IJ SOLN
INTRAMUSCULAR | Status: DC | PRN
  Administered 2015-10-18: 4 mg via INTRAVENOUS

## 2015-10-18 MED ORDER — FENTANYL CITRATE (PF) 100 MCG/2ML IJ SOLN
INTRAMUSCULAR | Status: DC | PRN
  Administered 2015-10-18 (×2): 25 ug via INTRAVENOUS

## 2015-10-18 SURGICAL SUPPLY — 26 items
BANDAGE ACE 4X5 VEL STRL LF (GAUZE/BANDAGES/DRESSINGS) ×3 IMPLANT
BANDAGE ELASTIC 4 LF NS (GAUZE/BANDAGES/DRESSINGS) ×3 IMPLANT
BLADE FULL RADIUS 3.5 (BLADE) IMPLANT
BLADE INCISOR PLUS 4.5 (BLADE) ×2 IMPLANT
BLADE SHAVER 4.5 DBL SERAT CV (CUTTER) IMPLANT
BLADE SHAVER 4.5X7 STR FR (MISCELLANEOUS) IMPLANT
BNDG CMPR MED 5X4 ELC HKLP NS (GAUZE/BANDAGES/DRESSINGS) ×1
CHLORAPREP W/TINT 26ML (MISCELLANEOUS) ×3 IMPLANT
CUTTER AGGRESSIVE+ 3.5 (CUTTER) ×3 IMPLANT
GAUZE SPONGE 4X4 12PLY STRL (GAUZE/BANDAGES/DRESSINGS) ×3 IMPLANT
GLOVE SURG ORTHO 9.0 STRL STRW (GLOVE) ×3 IMPLANT
GOWN STRL REUS W/ TWL LRG LVL3 (GOWN DISPOSABLE) ×1 IMPLANT
GOWN STRL REUS W/TWL LRG LVL3 (GOWN DISPOSABLE) ×3
GOWN SURG XXL (GOWNS) ×3 IMPLANT
IV LACTATED RINGER IRRG 3000ML (IV SOLUTION) ×12
IV LR IRRIG 3000ML ARTHROMATIC (IV SOLUTION) ×4 IMPLANT
KIT RM TURNOVER STRD PROC AR (KITS) ×3 IMPLANT
MANIFOLD NEPTUNE II (INSTRUMENTS) ×3 IMPLANT
PACK ARTHROSCOPY KNEE (MISCELLANEOUS) ×3 IMPLANT
SET TUBE SUCT SHAVER OUTFL 24K (TUBING) ×3 IMPLANT
SET TUBE TIP INTRA-ARTICULAR (MISCELLANEOUS) ×3 IMPLANT
SUT ETHILON 4-0 (SUTURE) ×3
SUT ETHILON 4-0 FS2 18XMFL BLK (SUTURE) ×1
SUTURE ETHLN 4-0 FS2 18XMF BLK (SUTURE) ×1 IMPLANT
TUBING ARTHRO INFLOW-ONLY STRL (TUBING) ×3 IMPLANT
WAND HAND CNTRL MULTIVAC 50 (MISCELLANEOUS) ×3 IMPLANT

## 2015-10-18 NOTE — Transfer of Care (Signed)
Immediate Anesthesia Transfer of Care Note  Patient: Alexis Christensen  Procedure(s) Performed: Procedure(s): KNEE ARTHROSCOPY LATERAL AND PARTIAL SYNOVECTOMY (Left)  Patient Location: PACU  Anesthesia Type:General  Level of Consciousness: sedated and responds to stimulation  Airway & Oxygen Therapy: Patient Spontanous Breathing and Patient connected to face mask oxygen  Post-op Assessment: Report given to RN and Post -op Vital signs reviewed and stable  Post vital signs: Reviewed and stable  Last Vitals:  Filed Vitals:   10/18/15 0911 10/18/15 1058  BP: 125/74 142/80  Pulse: 64 57  Temp: 36.7 C   Resp: 16 14    Complications: No apparent anesthesia complications

## 2015-10-18 NOTE — H&P (Signed)
Reviewed paper H+P, will be scanned into chart. No changes noted.  

## 2015-10-18 NOTE — Anesthesia Postprocedure Evaluation (Signed)
Anesthesia Post Note  Patient: Alexis Christensen  Procedure(s) Performed: Procedure(s) (LRB): KNEE ARTHROSCOPY LATERAL AND PARTIAL SYNOVECTOMY (Left)  Patient location during evaluation: PACU Anesthesia Type: General Level of consciousness: awake and alert Pain management: pain level controlled Vital Signs Assessment: post-procedure vital signs reviewed and stable Respiratory status: spontaneous breathing, nonlabored ventilation, respiratory function stable and patient connected to nasal cannula oxygen Cardiovascular status: blood pressure returned to baseline and stable Postop Assessment: no signs of nausea or vomiting Anesthetic complications: no    Last Vitals:  Filed Vitals:   10/18/15 1058 10/18/15 1100  BP: 142/80   Pulse: 57   Temp:  36.2 C  Resp: 14     Last Pain:  Filed Vitals:   10/18/15 1103  PainSc: [place]

## 2015-10-18 NOTE — Anesthesia Preprocedure Evaluation (Signed)
Anesthesia Evaluation  Patient identified by MRN, date of birth, ID band Patient awake    Reviewed: Allergy & Precautions, H&P , NPO status , Patient's Chart, lab work & pertinent test results, reviewed documented beta blocker date and time   Airway Mallampati: III  TM Distance: >3 FB Neck ROM: full    Dental  (+) Poor Dentition   Pulmonary neg pulmonary ROS, asthma , Current Smoker,    Pulmonary exam normal        Cardiovascular Exercise Tolerance: Good negative cardio ROS Normal cardiovascular exam+ Valvular Problems/Murmurs  Rate:Normal     Neuro/Psych  Headaches, PSYCHIATRIC DISORDERS  Neuromuscular disease negative neurological ROS  negative psych ROS   GI/Hepatic negative GI ROS, Neg liver ROS, GERD  ,  Endo/Other  negative endocrine ROSdiabetesHypothyroidism   Renal/GU Renal diseasenegative Renal ROS  negative genitourinary   Musculoskeletal   Abdominal   Peds  Hematology negative hematology ROS (+) anemia ,   Anesthesia Other Findings Past Medical History:   Bipolar 1 disorder ([facility])                                     Thyroid disease                                              Post traumatic stress disorder (PTSD)                        Anxiety                                                      Depression                                                   Stress incontinence                                          Urge incontinence                                            Heart burn                                                   Arthritis                                                    DVT (deep venous thrombosis) ([facility])  2016           Comment:RIGHT LEG   Heart murmur                                                 HLD (hyperlipidemia)                                         Adopted                                                      History of kidney stones                                      Effusion of knee                                12/30/2013    Edema leg                                                    Asthma                                                         Comment:WELL CONTROLLED-NO INHALER   Hypothyroidism                                               Diabetes mellitus without complication ([facility])                   Comment:BORDERLINE   GERD (gastroesophageal reflux disease)                       Chronic kidney disease                                         Comment:H/O KIDNEY STONES   Headache(784.0)                                                Comment:MIGRAINES   Anemia  Family history of adverse reaction to anesthes*                Comment:ADOPTED   Hx MRSA infection                                          Past Surgical History:   WRIST SURGERY                                                 ANKLE SURGERY                                                 KNEE ARTHROSCOPY                                              TUBAL LIGATION                                                CHOLECYSTECTOMY                                               ABDOMINAL HYSTERECTOMY                                        SHOULDER SURGERY                                Right 2014         REPLACEMENT TOTAL KNEE                          Right              Lymph Node removal                               2015           Comment:Neck BMI    Body Mass Index   37.74 kg/m 2     Reproductive/Obstetrics negative OB ROS                             Anesthesia Physical Anesthesia Plan  ASA: III  Anesthesia Plan: General and General LMA   Post-op Pain Management:    Induction:   Airway Management Planned:   Additional Equipment:   Intra-op Plan:   Post-operative Plan:   Informed Consent: I have reviewed the patients History and Physical, chart, labs and discussed the procedure  including the risks, benefits and  alternatives for the proposed anesthesia with the patient or authorized representative who has indicated his/her understanding and acceptance.   Dental Advisory Given  Plan Discussed with: CRNA  Anesthesia Plan Comments:         Anesthesia Quick Evaluation

## 2015-10-18 NOTE — Anesthesia Procedure Notes (Signed)
Procedure Name: LMA Insertion Date/Time: 10/18/2015 10:05 AM Performed by: Jonna Clark Pre-anesthesia Checklist: Patient identified, Patient being monitored, Timeout performed, Emergency Drugs available and Suction available Patient Re-evaluated:Patient Re-evaluated prior to inductionOxygen Delivery Method: Circle system utilized Preoxygenation: Pre-oxygenation with 100% oxygen Intubation Type: IV induction Ventilation: Mask ventilation without difficulty LMA: LMA inserted LMA Size: 3.5 Tube type: Oral Number of attempts: 1 Placement Confirmation: positive ETCO2 and breath sounds checked- equal and bilateral Tube secured with: Tape Dental Injury: Teeth and Oropharynx as per pre-operative assessment

## 2015-10-18 NOTE — Op Note (Signed)
10/18/2015  10:59 AM  PATIENT:  Alexis Christensen  44 y.o. female  PRE-OPERATIVE DIAGNOSIS:  LATERAL SUBLUXATION OF PATELLA,HOFFAS FAT PAD DISEASE  POST-OPERATIVE DIAGNOSIS:  LATERAL SUBLUXATION OF PATELLA,HOFFAS FAT PAD DISEASE  PROCEDURE:  Procedure(s): KNEE ARTHROSCOPY LATERAL AND PARTIAL SYNOVECTOMY (Left)  SURGEON: Laurene Footman, MD  ASSISTANTS: None  ANESTHESIA:   general  EBL:  Total I/O In: 500 [I.V.:500] Out: -   BLOOD ADMINISTERED:none  DRAINS: none   LOCAL MEDICATIONS USED:  MARCAINE     SPECIMEN:  No Specimen  DISPOSITION OF SPECIMEN:  N/A  COUNTS:  YES  TOURNIQUET:  16 minutes at 300 mmHg   IMPLANTS:  None  DICTATION: .Dragon Dictation patient was brought the operating room and after adequate anesthesia was obtained, the left leg was placed in arthroscopic leg holder with tourniquet applied. After prepping and draping in usual sterile fashion appropriate patient identification and timeout procedures were completed. An inferior lateral portal was made and initial inspection revealed lateral subluxation of the patella central and lateral facet wear on the patella and extensive fat pad and synovial hypertrophy anteriorly impinging on the patellofemoral joint the gutters were free of any loose bodies and inferior medial portal was made on probing there is mild chondromalacia both sides of the tibia and femur and the medial compartment and no meniscus tear anterior cruciate ligament intact and lateral compartment was relatively normal with no meniscus tear at this point a lateral releases carried out with use of a ArthriCare wand and after there was moderate bleeding so the tourniquet was raised at this 0.20 assistant coagulation of bleeders from the lateral release. The shaver was then introduced and the hypertrophied fat pad and synovium was debrided so that there is no longer any impingement in the anterior joint against the patella or patellofemoral joint after adequate  resection of this pre-and post procedure pictures have been obtained all instrumentation was withdrawn after thorough irrigation of the joint the wounds were closed with simple interrupted 4-0 nylon and 30 cc half percent Sensorcaine infiltrated in the area of the portals as well as the lateral release for postop analgesia Xeroform 4 x 4 web roll and Ace wrap applied    CARE: Discharge to home after PACU  PATIENT DISPOSITION:  PACU - hemodynamically stable.

## 2015-10-18 NOTE — Discharge Instructions (Addendum)
Keep dressing clean and dry. Take one aspirin a day and if bandage slides down the leg remove Ace wrap remove entire dressing covered to incisions with Band-Aids and then reapply Ace wrap only      AMBULATORY SURGERY  DISCHARGE INSTRUCTIONS   1) The drugs that you were given will stay in your system until tomorrow so for the next 24 hours you should not:  A) Drive an automobile B) Make any legal decisions C) Drink any alcoholic beverage   2) You may resume regular meals tomorrow.  Today it is better to start with liquids and gradually work up to solid foods.  You may eat anything you prefer, but it is better to start with liquids, then soup and crackers, and gradually work up to solid foods.   3) Please notify your doctor immediately if you have any unusual bleeding, trouble breathing, redness and pain at the surgery site, drainage, fever, or pain not relieved by medication. 4)   5) Your post-operative visit with Dr.                                     is: Date:                        Time:    Please call to schedule your post-operative visit.  6) Additional Instructions:

## 2015-10-19 ENCOUNTER — Ambulatory Visit: Payer: Medicare Other

## 2015-10-20 SURGERY — Surgical Case
Anesthesia: *Unknown

## 2015-11-18 ENCOUNTER — Other Ambulatory Visit: Payer: Self-pay | Admitting: Nurse Practitioner

## 2015-11-18 DIAGNOSIS — R1084 Generalized abdominal pain: Secondary | ICD-10-CM

## 2015-11-25 DIAGNOSIS — R197 Diarrhea, unspecified: Secondary | ICD-10-CM | POA: Insufficient documentation

## 2015-11-30 ENCOUNTER — Ambulatory Visit
Admission: RE | Admit: 2015-11-30 | Discharge: 2015-11-30 | Disposition: A | Discharge status: Home / Self Care | Payer: Medicare Other | Source: Ambulatory Visit | Attending: Nurse Practitioner | Admitting: Nurse Practitioner

## 2015-11-30 DIAGNOSIS — R1084 Generalized abdominal pain: Secondary | ICD-10-CM | POA: Diagnosis not present

## 2015-11-30 DIAGNOSIS — N83201 Unspecified ovarian cyst, right side: Secondary | ICD-10-CM | POA: Diagnosis not present

## 2015-11-30 DIAGNOSIS — K449 Diaphragmatic hernia without obstruction or gangrene: Secondary | ICD-10-CM | POA: Diagnosis not present

## 2015-11-30 DIAGNOSIS — K5730 Diverticulosis of large intestine without perforation or abscess without bleeding: Secondary | ICD-10-CM | POA: Diagnosis not present

## 2015-11-30 HISTORY — DX: Heart failure, unspecified: I50.9

## 2015-11-30 LAB — POCT I-STAT CREATININE: CREATININE: 0.6 mg/dL (ref 0.44–1.00)

## 2015-11-30 MED ORDER — IOPAMIDOL (ISOVUE-300) INJECTION 61%
100.0000 mL | Freq: Once | INTRAVENOUS | Status: AC | PRN
  Administered 2015-11-30: 100 mL via INTRAVENOUS

## 2016-01-11 DIAGNOSIS — E785 Hyperlipidemia, unspecified: Secondary | ICD-10-CM | POA: Insufficient documentation

## 2016-02-21 ENCOUNTER — Other Ambulatory Visit: Payer: Self-pay

## 2016-03-06 ENCOUNTER — Other Ambulatory Visit
Admission: RE | Admit: 2016-03-06 | Discharge: 2016-03-06 | Disposition: A | Discharge status: Home / Self Care | Payer: Medicare Other | Source: Ambulatory Visit | Attending: Student | Admitting: Student

## 2016-03-06 DIAGNOSIS — R197 Diarrhea, unspecified: Secondary | ICD-10-CM | POA: Insufficient documentation

## 2016-03-07 ENCOUNTER — Other Ambulatory Visit: Payer: Self-pay | Admitting: Otolaryngology

## 2016-03-07 DIAGNOSIS — R221 Localized swelling, mass and lump, neck: Secondary | ICD-10-CM

## 2016-03-08 LAB — PANCREATIC ELASTASE, FECAL

## 2016-03-08 LAB — H. PYLORI ANTIGEN, STOOL: H. PYLORI STOOL AG, EIA: POSITIVE — AB

## 2016-03-09 ENCOUNTER — Ambulatory Visit (INDEPENDENT_AMBULATORY_CARE_PROVIDER_SITE_OTHER): Payer: Medicare Other | Admitting: Urology

## 2016-03-09 VITALS — BP 122/87 | HR 76 | Ht 65.0 in | Wt 228.0 lb

## 2016-03-09 DIAGNOSIS — N3946 Mixed incontinence: Secondary | ICD-10-CM | POA: Diagnosis not present

## 2016-03-09 NOTE — Progress Notes (Signed)
03/09/2016 11:12 AM   Alexis Christensen 19-Mar-1971 [id number]  Referring provider: Perrin Maltese, MD [address] [place], [place] [address]  Chief Complaint  Patient presents with  . Urinary Incontinence    40month follow up    HPI: I initially assess the patient a number of months ago Alexis Christensen was assessed by Larene [facility] for stress urinary incontinence and is here for reevaluation Alexis Christensen leaks with coughing sneezing bending or lifting and has more coughing spells recently. She has urge incontinence when she holds it too long and they're both significant. She denies enuresis. She consulted 6 pads a day. She has postvoid dribbling She voids every 30-60 minutes cannot sit through and through are moving. She is ankle edema gets up 4 times a night. 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 Bladder capacity was 470 mL. Bladder had low pressure his bili and she did not leak. She had mild stress incontinence at 150 mL at 97 cm of water at 300 mL at 124 cm water she very mild leakage. She generated low pressure contraction during voluntary voiding. EMG activity increased during the voiding phase. Bladder neck dissented approximate 2 cm. There was a lot of artifact noted. She was doing a lot of straining.  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 She failed Vesicare. I gave her a trial of Toviaz and the beta 3 agonists  Medications failed The patient said stress incontinence for many years and and it is the urge component that has worsened over time making her come see [name]. Recently the urge component and even some foot on the floor syndrome has worsened. When she coughs she leaks a small  amount and then she leaks a higher volume and likely is triggering  I drew her a picture and we discussed pathophysiology of her incontinence. I talked about watchful waiting versus sling and went through all complications as noted on my template. Turned about her persistent overactive bladder mesh issues were discussed as well as pelvic pain.  I talked her about percutaneous tibial nerve stimulation versus sacral nerve modulation versus botulinum toxin as well as our 2 studies. She understands and could live with her stress component I would not be helped by these treatments  I think she made a good decision and would like to proceed with a sling. He is hoping this will address both components. She knows that she can have refractory OAB therapies post sling if the OAB component persists or worsens. She would not be a candidate for one of our current studies  She will be taught clean intermittent catheterization as per protocol and we will call her  25 minutes or longer was used for face-to-face discussion       Past Medical History:  Diagnosis Date  . Adopted   . Anemia   . Anxiety   . Arthritis   . Asthma    WELL CONTROLLED-NO INHALER  . Bipolar 1 disorder ([place])   . CHF (congestive heart failure) ([place])   . Chronic kidney disease    H/O KIDNEY STONES  . Depression   . Diabetes mellitus without complication (HCC)    BORDERLINE  .  DVT (deep venous thrombosis) ([place]) 2016   RIGHT LEG  . Edema leg   . Effusion of knee 12/30/2013  . Family history of adverse reaction to anesthesia    ADOPTED  . GERD (gastroesophageal reflux disease)   . Headache(784.0)    MIGRAINES  . Heart burn   . Heart murmur   . History of kidney stones   . HLD (hyperlipidemia)   . Hx MRSA infection   . Hypothyroidism   . Post traumatic stress disorder (PTSD)   . Stress incontinence   . Thyroid disease   . Urge incontinence     Surgical History: Past Surgical History:  Procedure Laterality Date    . ABDOMINAL HYSTERECTOMY    . ANKLE SURGERY    . CHOLECYSTECTOMY    . KNEE ARTHROSCOPY    . KNEE ARTHROSCOPY WITH LATERAL RELEASE Left 10/18/2015   Procedure: KNEE ARTHROSCOPY LATERAL AND PARTIAL SYNOVECTOMY;  Surgeon: Hessie Knows, MD;  Location: [facility] ORS;  Service: Orthopedics;  Laterality: Left;  . Lymph Node removal  2015   Neck  . REPLACEMENT TOTAL KNEE Right   . SHOULDER SURGERY Right 2014  . TUBAL LIGATION    . WRIST SURGERY      Home Medications:    Medication List       Accurate as of 03/09/16 11:12 AM. Always use your most recent med list.          clobetasol cream 0.05 % Commonly known as:  TEMOVATE Apply 1 application topically 2 (two) times daily.   doxepin 50 MG capsule Commonly known as:  SINEQUAN Take 50 mg by mouth as needed.   EPIPEN 2-PAK 0.3 mg/0.3 mL Soaj injection Generic drug:  EPINEPHrine   fluticasone 50 MCG/ACT nasal spray Commonly known as:  FLONASE Place 1 spray into both nostrils 2 (two) times daily.   gabapentin 600 MG tablet Commonly known as:  NEURONTIN Take 600 mg by mouth 3 (three) times daily.   hydrOXYzine 25 MG tablet Commonly known as:  ATARAX/VISTARIL Take 25 mg by mouth every 8 (eight) hours as needed.   levocetirizine 5 MG tablet Commonly known as:  XYZAL Take 5 mg by mouth every evening.   levothyroxine 150 MCG tablet Commonly known as:  SYNTHROID, LEVOTHROID   PATADAY 0.2 % Soln Generic drug:  Olopatadine HCl   SUBOXONE 8-2 MG Film Generic drug:  Buprenorphine HCl-Naloxone HCl DOSSOLVE 1 FILM UNDER THE TONGUE QD-AM   triamcinolone cream 0.1 % Commonly known as:  KENALOG Apply topically. Reported on 08/26/2015       Allergies:  Allergies  Allergen Reactions  . Geodon [Ziprasidone Hydrochloride] Other (See Comments)    Numbness ,sob, headaches, blurred vision  . Ziprasidone Hcl Anaphylaxis    Other reaction(s): Other (See Comments), Unknown Numbness ,sob, headaches, blurred vision Numbness ,sob,  headaches, blurred vision  . Strawberry (Diagnostic)     rash  . Sulfa Antibiotics Hives  . Ace Inhibitors Rash  . Aripiprazole Rash  . Erythromycin Rash and Hives  . Hydromorphone Rash  . Penicillins Rash    Family History: Family History  Problem Relation Age of Onset  . Adopted: Yes    Social History:  reports that she has been smoking Cigarettes.  She has a 5.00 pack-year smoking history. She has never used smokeless tobacco. She reports that she drinks alcohol. She reports that she does not use drugs.  ROS: UROLOGY Frequent Urination?: Yes Hard to postpone urination?: Yes Burning/pain with urination?: No Get up at  night to urinate?: Yes Leakage of urine?: Yes Urine stream starts and stops?: No Trouble starting stream?: No Do you have to strain to urinate?: No Blood in urine?: No Urinary tract infection?: No Sexually transmitted disease?: No Injury to kidneys or bladder?: No Painful intercourse?: No Weak stream?: No Currently pregnant?: No Vaginal bleeding?: No Last menstrual period?: n  Gastrointestinal Nausea?: No Vomiting?: No Indigestion/heartburn?: Yes Diarrhea?: Yes Constipation?: No  Constitutional Fever: No Night sweats?: Yes Weight loss?: Yes Fatigue?: No  Skin Skin rash/lesions?: Yes Itching?: Yes  Eyes Blurred vision?: No Double vision?: No  Ears/Nose/Throat Sore throat?: No Sinus problems?: Yes  Hematologic/Lymphatic Swollen glands?: No Easy bruising?: Yes  Cardiovascular Leg swelling?: Yes Chest pain?: No  Respiratory Cough?: No Shortness of breath?: No  Endocrine Excessive thirst?: Yes  Musculoskeletal Back pain?: No Joint pain?: No  Neurological Headaches?: Yes Dizziness?: No  Psychologic Depression?: Yes Anxiety?: Yes  Physical Exam: BP 122/87   Pulse 76   Ht 5\' 5"  (1.651 m)   Wt 228 lb (103.4 kg)   BMI 37.94 kg/m    Laboratory Data: Lab Results  Component Value Date   WBC 6.2 08/13/2015   HGB  12.9 10/18/2015   HCT 38.0 10/18/2015   MCV 82.2 08/13/2015   PLT 364 08/13/2015    Lab Results  Component Value Date   CREATININE 0.60 11/30/2015    No results found for: PSA  No results found for: TESTOSTERONE  No results found for: HGBA1C  Urinalysis    Component Value Date/Time   COLORURINE YELLOW (A) 08/13/2015 1455   APPEARANCEUR Cloudy (A) 08/26/2015 1109   LABSPEC 1.018 08/13/2015 1455   PHURINE 7.0 08/13/2015 1455   GLUCOSEU Negative 08/26/2015 1109   HGBUR NEGATIVE 08/13/2015 1455   BILIRUBINUR Negative 08/26/2015 1109   KETONESUR NEGATIVE 08/13/2015 1455   PROTEINUR 1+ (A) 08/26/2015 1109   PROTEINUR NEGATIVE 08/13/2015 1455   UROBILINOGEN 1.0 06/18/2011 1258   NITRITE Negative 08/26/2015 1109   NITRITE NEGATIVE 08/13/2015 1455   LEUKOCYTESUR Negative 08/26/2015 1109    Pertinent Imaging: none  Assessment & Plan:  Mixed incontinence; see above dictation  There are no diagnoses linked to this encounter.  No Follow-up on file.  Reece Packer, MD  [facility] [address], [place],  [address] [phone]

## 2016-03-13 ENCOUNTER — Ambulatory Visit
Admission: RE | Admit: 2016-03-13 | Discharge: 2016-03-13 | Disposition: A | Discharge status: Home / Self Care | Payer: Medicare Other | Source: Ambulatory Visit | Attending: Otolaryngology | Admitting: Otolaryngology

## 2016-03-13 DIAGNOSIS — R221 Localized swelling, mass and lump, neck: Secondary | ICD-10-CM | POA: Diagnosis not present

## 2016-03-14 ENCOUNTER — Other Ambulatory Visit: Payer: Self-pay | Admitting: Radiology

## 2016-03-14 ENCOUNTER — Telehealth: Payer: Self-pay | Admitting: Radiology

## 2016-03-14 DIAGNOSIS — N393 Stress incontinence (female) (male): Secondary | ICD-10-CM

## 2016-03-14 NOTE — Telephone Encounter (Signed)
Notified patient of surgery scheduled with Dr. Matilde Sprang on 04/09/2016, preadmission testing appt On 03/30/2016 at 8:15, and to call Friday prior to surgery for arrival time to same day surgery. Advised patient to return to clinic  On 04/03/2016 at 8:30 for CIC teaching prior to surgery.patient voices understanding.

## 2016-03-30 ENCOUNTER — Encounter: Payer: Self-pay | Admitting: Emergency Medicine

## 2016-03-30 ENCOUNTER — Encounter (INDEPENDENT_AMBULATORY_CARE_PROVIDER_SITE_OTHER): Payer: Self-pay

## 2016-03-30 ENCOUNTER — Emergency Department: Payer: Medicare Other

## 2016-03-30 ENCOUNTER — Emergency Department
Admission: EM | Admit: 2016-03-30 | Discharge: 2016-03-30 | Disposition: A | Discharge status: Home / Self Care | Payer: Medicare Other | Attending: Emergency Medicine | Admitting: Emergency Medicine

## 2016-03-30 ENCOUNTER — Other Ambulatory Visit: Payer: Medicare Other

## 2016-03-30 DIAGNOSIS — E039 Hypothyroidism, unspecified: Secondary | ICD-10-CM | POA: Diagnosis not present

## 2016-03-30 DIAGNOSIS — N189 Chronic kidney disease, unspecified: Secondary | ICD-10-CM | POA: Insufficient documentation

## 2016-03-30 DIAGNOSIS — Y999 Unspecified external cause status: Secondary | ICD-10-CM | POA: Diagnosis not present

## 2016-03-30 DIAGNOSIS — I509 Heart failure, unspecified: Secondary | ICD-10-CM | POA: Diagnosis not present

## 2016-03-30 DIAGNOSIS — J45909 Unspecified asthma, uncomplicated: Secondary | ICD-10-CM | POA: Diagnosis not present

## 2016-03-30 DIAGNOSIS — E1122 Type 2 diabetes mellitus with diabetic chronic kidney disease: Secondary | ICD-10-CM | POA: Diagnosis not present

## 2016-03-30 DIAGNOSIS — F17210 Nicotine dependence, cigarettes, uncomplicated: Secondary | ICD-10-CM | POA: Insufficient documentation

## 2016-03-30 DIAGNOSIS — W010XXA Fall on same level from slipping, tripping and stumbling without subsequent striking against object, initial encounter: Secondary | ICD-10-CM | POA: Insufficient documentation

## 2016-03-30 DIAGNOSIS — S63502A Unspecified sprain of left wrist, initial encounter: Secondary | ICD-10-CM | POA: Insufficient documentation

## 2016-03-30 DIAGNOSIS — Y929 Unspecified place or not applicable: Secondary | ICD-10-CM | POA: Diagnosis not present

## 2016-03-30 DIAGNOSIS — I130 Hypertensive heart and chronic kidney disease with heart failure and stage 1 through stage 4 chronic kidney disease, or unspecified chronic kidney disease: Secondary | ICD-10-CM | POA: Diagnosis not present

## 2016-03-30 DIAGNOSIS — Y939 Activity, unspecified: Secondary | ICD-10-CM | POA: Insufficient documentation

## 2016-03-30 DIAGNOSIS — M25532 Pain in left wrist: Secondary | ICD-10-CM

## 2016-03-30 DIAGNOSIS — S6992XA Unspecified injury of left wrist, hand and finger(s), initial encounter: Secondary | ICD-10-CM | POA: Diagnosis present

## 2016-03-30 MED ORDER — TRAMADOL HCL 50 MG PO TABS
50.0000 mg | ORAL_TABLET | Freq: Once | ORAL | Status: AC
  Administered 2016-03-30: 50 mg via ORAL
  Filled 2016-03-30: qty 1

## 2016-03-30 MED ORDER — TRAMADOL HCL 50 MG PO TABS: 50.0000 mg | ORAL_TABLET | Freq: Four times a day (QID) | ORAL | 0 refills | Status: DC | PRN

## 2016-03-30 NOTE — ED Notes (Signed)
Patient tripped stepping off of a side walk. Patient states that she fell onto her left hand and wrist. Patient with pain to fourth and fifth finger, hand and wrist. Patient positive radial pulse.

## 2016-03-30 NOTE — ED Provider Notes (Signed)
[facility] Emergency Department Provider Note  ____________________________________________  Time seen: Approximately 8:39 PM  I have reviewed the triage vital signs and the nursing notes.   HISTORY  Chief Complaint Wrist Pain    HPI Alexis Christensen is a 46 y.o. female who fell earlier landing on her left wrist. Has pain to the left hand laterally and down the distal forearm. Minimal swelling. No bruising. Denies injury to the elbow or other extremities.   Past Medical History:  Diagnosis Date  . Adopted   . Anemia   . Anxiety   . Arthritis   . Asthma    WELL CONTROLLED-NO INHALER  . Bipolar 1 disorder ([place])   . CHF (congestive heart failure) ([place])   . Chronic kidney disease    H/O KIDNEY STONES  . Depression   . Diabetes mellitus without complication ([place])    BORDERLINE  . DVT (deep venous thrombosis) ([place]) 2016   RIGHT LEG  . Edema leg   . Effusion of knee 12/30/2013  . Family history of adverse reaction to anesthesia    ADOPTED  . GERD (gastroesophageal reflux disease)   . Headache(784.0)    MIGRAINES  . Heart burn   . Heart murmur   . History of kidney stones   . HLD (hyperlipidemia)   . Hx MRSA infection   . Hypothyroidism   . Post traumatic stress disorder (PTSD)   . Stress incontinence   . Thyroid disease   . Urge incontinence     Patient Active Problem List   Diagnosis Date Noted  . Chronic diarrhea 09/07/2015  . Fever 09/07/2015  . Hypothyroidism 09/07/2015  . Hypovitaminosis D 09/07/2015  . Obesity 09/07/2015  . Weight loss, unintentional 09/07/2015  . Bipolar mixed affective disorder, moderate ([place]) 09/05/2015  . SUI (stress urinary incontinence, female) 05/18/2015  . Pain in shoulder 09/15/2014  . Carpal tunnel syndrome 09/15/2014  . Effusion of knee 12/30/2013  . 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 Gonalgia 12/30/2013  . Lipoma of arm 05/11/2013    Past Surgical History:  Procedure Laterality Date  .  ABDOMINAL HYSTERECTOMY    . ANKLE SURGERY    . CHOLECYSTECTOMY    . KNEE ARTHROSCOPY    . KNEE ARTHROSCOPY WITH LATERAL RELEASE Left 10/18/2015   Procedure: KNEE ARTHROSCOPY LATERAL AND PARTIAL SYNOVECTOMY;  Surgeon: Hessie Knows, MD;  Location: [facility] ORS;  Service: Orthopedics;  Laterality: Left;  . Lymph Node removal  2015   Neck  . REPLACEMENT TOTAL KNEE Right   . SHOULDER SURGERY Right 2014  . TUBAL LIGATION    . WRIST SURGERY      Current Outpatient Rx  . Order #: [id number] Class: Historical Med  . Order #: [id number] Class: Historical Med  . Order #: [id number] Class: Historical Med  . Order #: [id number] Class: Historical Med  . Order #: [id number] Class: Historical Med  . Order #: [id number] Class: Historical Med  . Order #: [id number] Class: Historical Med  . Order #: [id number] Class: Historical Med  . Order #: [id number] Class: Historical Med  . Order #: [id number] Class: Historical Med  . Order #: [id number] Class: Historical Med  . Order #: [id number] Class: Print  . Order #: [id number] Class: Historical Med    Allergies Geodon [ziprasidone hydrochloride]; Ziprasidone hcl; Strawberry (diagnostic); Sulfa antibiotics; Ace inhibitors; Aripiprazole; Erythromycin; Hydromorphone; Lamictal [lamotrigine]; and Penicillins  Family History  Problem Relation Age of Onset  . Adopted: Yes    Social History Social History  Substance Use Topics  . Smoking status: Current  Every Day Smoker    Packs/day: 0.25    Years: 20.00    Types: Cigarettes  . Smokeless tobacco: Never Used     Comment: STRESS RELATED  . Alcohol use 0.0 oz/week     Comment: occasional    Review of Systems Constitutional: No fever/chills Eyes: No visual changes. ENT: No sore throat. Cardiovascular: Denies chest pain. Respiratory: Denies shortness of breath. Gastrointestinal: No abdominal pain.  No nausea, no vomiting.  No diarrhea.  No constipation. Genitourinary: Negative for dysuria. Musculoskeletal: Negative for back  pain. Skin: Negative for rash. Neurological: Negative for headaches, focal weakness or numbness. 10-point ROS otherwise negative.  ____________________________________________   PHYSICAL EXAM:  VITAL SIGNS: ED Triage Vitals  Enc Vitals Group     BP 03/30/16 1956 124/85     Pulse Rate 03/30/16 1956 64     Resp 03/30/16 1956 18     Temp 03/30/16 1956 98.2 F (36.8 C)     Temp Source 03/30/16 1956 Oral     SpO2 03/30/16 1956 99 %     Weight 03/30/16 1957 225 lb (102.1 kg)     Height 03/30/16 1957 5\' 7"  (1.702 m)     Head Circumference --      Peak Flow --      Pain Score 03/30/16 1958 9     Pain Loc --      Pain Edu? --      Excl. in [place]? --     Constitutional: Alert and oriented. Well appearing and in no acute distress. Eyes: Conjunctivae are normal. PERRL. EOMI. Ears:  Clear with normal landmarks. No erythema. Head: Atraumatic. Nose: No congestion/rhinnorhea. Mouth/Throat: Mucous membranes are moist.  Oropharynx non-erythematous. No lesions. Neck:  Supple.   Cardiovascular: Normal rate, regular rhythm. Grossly normal heart sounds.  Musculoskeletal: Left wrist; tender to the lateral wrist, snuffbox, fourth and fifth carpal bones and distal ulna. No swelling. No bruising. Neurologic:  Normal speech and language. No gross focal neurologic deficits are appreciated. No gait instability. Skin:  Skin is warm, dry and intact. No rash noted. Psychiatric: Mood and affect are normal. Speech and behavior are normal.  ____________________________________________   LABS (all labs ordered are listed, but only abnormal results are displayed)  Labs Reviewed - No data to display ____________________________________________  EKG   ____________________________________________  RADIOLOGY  Study Result   CLINICAL DATA:  Acute onset of left hand and wrist pain. Initial encounter.  EXAM: LEFT HAND - COMPLETE 3+ VIEW  COMPARISON:  None.  FINDINGS: There is no evidence of  fracture or dislocation. The joint spaces are preserved. The carpal rows are intact, and demonstrate normal alignment. The soft tissues are unremarkable in appearance.  IMPRESSION: No evidence of fracture or dislocation.   Electronically Signed   By: Garald Balding M.D.   On: 03/30/2016 20:30     ____________________________________________   PROCEDURES  Procedure(s) performed: None  Critical Care performed: No  ____________________________________________   INITIAL IMPRESSION / ASSESSMENT AND PLAN / ED COURSE  Pertinent labs & imaging results that were available during my care of the patient were reviewed by me and considered in my medical decision making (see chart for details).  45 year old female with fall suffering pain to the left wrist. Suspect a left wrist sprain. Placed in a left wrist splint. Encouraged ice. Can follow-up with orthopedics next week if not improving. She declined ibuprofen due to stomach irritation. Given a few tramadol. ____________________________________________   FINAL CLINICAL IMPRESSION(S) / ED DIAGNOSES  Final diagnoses:  Left wrist pain  Wrist sprain, left, initial encounter      Mortimer Fries, PA-C 03/30/16 2150    Nance Pear, MD 03/30/16 [phone]

## 2016-03-30 NOTE — ED Triage Notes (Signed)
Pt presents to ED with left hand / wrist pain after she slipped off the sidewalk landing on her left hand. Pt states she can not move her 4th and 5th digits. No obvious deformity. Ice applied in triage.

## 2016-03-30 NOTE — Discharge Instructions (Signed)
Wear wrist splint and take pain medicine as needed. Use ice for the injury. Follow-up with orthopedics if not improving.

## 2016-04-03 ENCOUNTER — Ambulatory Visit (INDEPENDENT_AMBULATORY_CARE_PROVIDER_SITE_OTHER): Payer: Medicare Other

## 2016-04-03 ENCOUNTER — Encounter
Admission: RE | Admit: 2016-04-03 | Discharge: 2016-04-03 | Disposition: A | Discharge status: Home / Self Care | Payer: Medicare Other | Source: Ambulatory Visit | Attending: Urology | Admitting: Urology

## 2016-04-03 VITALS — BP 117/78 | HR 55 | Temp 97.9°F | Wt 224.5 lb

## 2016-04-03 DIAGNOSIS — Z7189 Other specified counseling: Secondary | ICD-10-CM

## 2016-04-03 DIAGNOSIS — Z01812 Encounter for preprocedural laboratory examination: Secondary | ICD-10-CM | POA: Insufficient documentation

## 2016-04-03 DIAGNOSIS — R32 Unspecified urinary incontinence: Secondary | ICD-10-CM | POA: Diagnosis not present

## 2016-04-03 HISTORY — DX: Other seasonal allergic rhinitis: J30.2

## 2016-04-03 HISTORY — DX: Localized edema: R60.0

## 2016-04-03 HISTORY — DX: Peripheral vascular disease, unspecified: I73.9

## 2016-04-03 LAB — BASIC METABOLIC PANEL
Anion gap: 7 (ref 5–15)
BUN: 11 mg/dL (ref 6–20)
CO2: 26 mmol/L (ref 22–32)
CREATININE: 0.7 mg/dL (ref 0.44–1.00)
Calcium: 9 mg/dL (ref 8.9–10.3)
Chloride: 106 mmol/L (ref 101–111)
GFR calc Af Amer: >60 mL/min (ref >60)
GLUCOSE: 102 mg/dL — AB (ref 65–99)
Potassium: 3.6 mmol/L (ref 3.5–5.1)
SODIUM: 139 mmol/L (ref 135–145)

## 2016-04-03 LAB — CBC
HCT: 33.8 % — ABNORMAL LOW (ref 35.0–47.0)
Hemoglobin: 11.3 g/dL — ABNORMAL LOW (ref 12.0–16.0)
MCH: 27.7 pg (ref 26.0–34.0)
MCHC: 33.6 g/dL (ref 32.0–36.0)
MCV: 82.4 fL (ref 80.0–100.0)
PLATELETS: 282 10*3/uL (ref 150–440)
RBC: 4.1 MIL/uL (ref 3.80–5.20)
RDW: 15.5 % — AB (ref 11.5–14.5)
WBC: 8.5 10*3/uL (ref 3.6–11.0)

## 2016-04-03 LAB — PROTIME-INR
INR: 1.03
Prothrombin Time: 13.5 seconds (ref 11.4–15.2)

## 2016-04-03 LAB — SURGICAL PCR SCREEN
MRSA, PCR: NEGATIVE
Staphylococcus aureus: POSITIVE — AB

## 2016-04-03 LAB — APTT: APTT: 30 s (ref 24–36)

## 2016-04-03 NOTE — Patient Instructions (Signed)
  Your procedure is scheduled on: 04/09/16 Mon Report to Same Day Surgery 2nd floor medical mall To find out your arrival time please call [phone] between 1PM - 3PM on 04/06/16 Fri  Remember: Instructions that are not followed completely may result in serious medical risk, up to and including death, or upon the discretion of your surgeon and anesthesiologist your surgery may need to be rescheduled.    _x___ 1. Do not eat food or drink liquids after midnight. No gum chewing or hard candies.     __x__ 2. No Alcohol for 24 hours before or after surgery.   __x__3. No Smoking for 24 prior to surgery.   ____  4. Bring all medications with you on the day of surgery if instructed.    __x__ 5. Notify your doctor if there is any change in your medical condition     (cold, fever, infections).     Do not wear jewelry, make-up, hairpins, clips or nail polish.  Do not wear lotions, powders, or perfumes. You may wear deodorant.  Do not shave 48 hours prior to surgery. Men may shave face and neck.  Do not bring valuables to the hospital.    [facility] is not responsible for any belongings or valuables.               Contacts, dentures or bridgework may not be worn into surgery.  Leave your suitcase in the car. After surgery it may be brought to your room.  For patients admitted to the hospital, discharge time is determined by your treatment team.   Patients discharged the day of surgery will not be allowed to drive home.    Please read over the following fact sheets that you were given:   [facility] Preparing for Surgery and or MRSA Information   _x___ Take these medicines the morning of surgery with A SIP OF WATER:    1. gabapentin (NEURONTIN) 600 MG tablet  2.levothyroxine (SYNTHROID, LEVOTHROID) 150 MCG tablet  3.traMADol (ULTRAM) 50 MG tablet if needed  4.  5.  6.  ____ Fleet Enema (as directed)   _x___ Use CHG Soap or sage wipes as directed on instruction sheet   ____ Use  inhalers on the day of surgery and bring to hospital day of surgery  ____ Stop metformin 2 days prior to surgery    ____ Take 1/2 of usual insulin dose the night before surgery and none on the morning of           surgery.   ____ Stop aspirin or coumadin, or plavix  _x__ Stop Anti-inflammatories such as Advil, Aleve, Ibuprofen, Motrin, Naproxen,          Naprosyn, Goodies powders or aspirin products. Ok to take Tylenol.   ____ Stop supplements until after surgery.    ____ Bring C-Pap to the hospital.  Stop Suboxone am of surgery

## 2016-04-03 NOTE — Progress Notes (Addendum)
Continuous Intermittent Catheterization  Due to bladder sling surgery patient is present today for a teaching of self I & O Catheterization. Patient was given detailed verbal and printed instructions of self catheterization. Patient was cleaned and prepped in a sterile fashion.  With instruction and assistance patient inserted a 14FR and urine return was noted 20 ml, urine was yellow in color. Patient tolerated well, no complications were noted Patient was given a sample bag with supplies to take home.  Instructions were given per Dr. Matilde Sprang for patient to cath as needed post surgery.    Preformed by: Toniann Fail, LPN  Blood pressure 624THL, pulse (!) 55, temperature 97.9 F (36.6 C), weight 224 lb 8 oz (101.8 kg).

## 2016-04-06 NOTE — Pre-Procedure Instructions (Signed)
Positive staph aureus sent to Dr. Matilde Sprang for review.  Asked if wanted any treatment?

## 2016-04-08 MED ORDER — CIPROFLOXACIN IN D5W 400 MG/200ML IV SOLN
400.0000 mg | INTRAVENOUS | Status: AC
  Administered 2016-04-09: 400 mg via INTRAVENOUS

## 2016-04-09 ENCOUNTER — Encounter: Payer: Self-pay | Admitting: *Deleted

## 2016-04-09 ENCOUNTER — Ambulatory Visit
Admission: RE | Admit: 2016-04-09 | Discharge: 2016-04-09 | Disposition: A | Discharge status: Home / Self Care | Payer: Medicare Other | Source: Ambulatory Visit | Attending: Urology | Admitting: Urology

## 2016-04-09 ENCOUNTER — Ambulatory Visit: Payer: Medicare Other | Admitting: Certified Registered"

## 2016-04-09 ENCOUNTER — Encounter
Admission: RE | Disposition: A | Discharge status: Home / Self Care | Payer: Self-pay | Source: Ambulatory Visit | Attending: Urology

## 2016-04-09 DIAGNOSIS — Z86718 Personal history of other venous thrombosis and embolism: Secondary | ICD-10-CM | POA: Diagnosis not present

## 2016-04-09 DIAGNOSIS — Z8614 Personal history of Methicillin resistant Staphylococcus aureus infection: Secondary | ICD-10-CM | POA: Diagnosis not present

## 2016-04-09 DIAGNOSIS — E785 Hyperlipidemia, unspecified: Secondary | ICD-10-CM | POA: Insufficient documentation

## 2016-04-09 DIAGNOSIS — Z881 Allergy status to other antibiotic agents status: Secondary | ICD-10-CM | POA: Diagnosis not present

## 2016-04-09 DIAGNOSIS — M1990 Unspecified osteoarthritis, unspecified site: Secondary | ICD-10-CM | POA: Insufficient documentation

## 2016-04-09 DIAGNOSIS — Z96611 Presence of right artificial shoulder joint: Secondary | ICD-10-CM | POA: Insufficient documentation

## 2016-04-09 DIAGNOSIS — J45909 Unspecified asthma, uncomplicated: Secondary | ICD-10-CM | POA: Insufficient documentation

## 2016-04-09 DIAGNOSIS — E119 Type 2 diabetes mellitus without complications: Secondary | ICD-10-CM | POA: Insufficient documentation

## 2016-04-09 DIAGNOSIS — D649 Anemia, unspecified: Secondary | ICD-10-CM | POA: Insufficient documentation

## 2016-04-09 DIAGNOSIS — F329 Major depressive disorder, single episode, unspecified: Secondary | ICD-10-CM | POA: Insufficient documentation

## 2016-04-09 DIAGNOSIS — N393 Stress incontinence (female) (male): Secondary | ICD-10-CM | POA: Diagnosis present

## 2016-04-09 DIAGNOSIS — G43909 Migraine, unspecified, not intractable, without status migrainosus: Secondary | ICD-10-CM | POA: Insufficient documentation

## 2016-04-09 DIAGNOSIS — I509 Heart failure, unspecified: Secondary | ICD-10-CM | POA: Insufficient documentation

## 2016-04-09 DIAGNOSIS — I739 Peripheral vascular disease, unspecified: Secondary | ICD-10-CM | POA: Insufficient documentation

## 2016-04-09 DIAGNOSIS — F419 Anxiety disorder, unspecified: Secondary | ICD-10-CM | POA: Insufficient documentation

## 2016-04-09 DIAGNOSIS — F309 Manic episode, unspecified: Secondary | ICD-10-CM | POA: Insufficient documentation

## 2016-04-09 DIAGNOSIS — Z96651 Presence of right artificial knee joint: Secondary | ICD-10-CM | POA: Diagnosis not present

## 2016-04-09 DIAGNOSIS — R011 Cardiac murmur, unspecified: Secondary | ICD-10-CM | POA: Insufficient documentation

## 2016-04-09 DIAGNOSIS — Z90710 Acquired absence of both cervix and uterus: Secondary | ICD-10-CM | POA: Diagnosis not present

## 2016-04-09 DIAGNOSIS — F4310 Post-traumatic stress disorder, unspecified: Secondary | ICD-10-CM | POA: Insufficient documentation

## 2016-04-09 DIAGNOSIS — Z9049 Acquired absence of other specified parts of digestive tract: Secondary | ICD-10-CM | POA: Diagnosis not present

## 2016-04-09 DIAGNOSIS — K219 Gastro-esophageal reflux disease without esophagitis: Secondary | ICD-10-CM | POA: Diagnosis not present

## 2016-04-09 DIAGNOSIS — Z87442 Personal history of urinary calculi: Secondary | ICD-10-CM | POA: Insufficient documentation

## 2016-04-09 DIAGNOSIS — Z79899 Other long term (current) drug therapy: Secondary | ICD-10-CM | POA: Diagnosis not present

## 2016-04-09 DIAGNOSIS — Z888 Allergy status to other drugs, medicaments and biological substances status: Secondary | ICD-10-CM | POA: Insufficient documentation

## 2016-04-09 DIAGNOSIS — E039 Hypothyroidism, unspecified: Secondary | ICD-10-CM | POA: Diagnosis not present

## 2016-04-09 DIAGNOSIS — Z91018 Allergy to other foods: Secondary | ICD-10-CM | POA: Insufficient documentation

## 2016-04-09 DIAGNOSIS — N3289 Other specified disorders of bladder: Secondary | ICD-10-CM | POA: Insufficient documentation

## 2016-04-09 DIAGNOSIS — Z880 Allergy status to penicillin: Secondary | ICD-10-CM | POA: Insufficient documentation

## 2016-04-09 DIAGNOSIS — Z882 Allergy status to sulfonamides status: Secondary | ICD-10-CM | POA: Insufficient documentation

## 2016-04-09 DIAGNOSIS — F17210 Nicotine dependence, cigarettes, uncomplicated: Secondary | ICD-10-CM | POA: Insufficient documentation

## 2016-04-09 HISTORY — PX: PUBOVAGINAL SLING: SHX1035

## 2016-04-09 HISTORY — PX: CYSTOSCOPY: SHX5120

## 2016-04-09 LAB — GLUCOSE, CAPILLARY: GLUCOSE-CAPILLARY: 88 mg/dL (ref 65–99)

## 2016-04-09 SURGERY — CYSTOSCOPY
Anesthesia: General | Site: Vagina | Wound class: Clean Contaminated

## 2016-04-09 MED ORDER — LIDOCAINE-EPINEPHRINE (PF) 1 %-1:200000 IJ SOLN
INTRAMUSCULAR | Status: AC
  Filled 2016-04-09: qty 30

## 2016-04-09 MED ORDER — NEOMYCIN-POLYMYXIN B GU 40-200000 IR SOLN
Status: AC
  Filled 2016-04-09: qty 1

## 2016-04-09 MED ORDER — ONDANSETRON HCL 4 MG/2ML IJ SOLN
INTRAMUSCULAR | Status: DC | PRN
  Administered 2016-04-09: 4 mg via INTRAVENOUS

## 2016-04-09 MED ORDER — CIPROFLOXACIN IN D5W 400 MG/200ML IV SOLN
INTRAVENOUS | Status: AC
  Filled 2016-04-09: qty 200

## 2016-04-09 MED ORDER — SODIUM CHLORIDE 0.9 % IV SOLN
INTRAVENOUS | Status: DC
  Administered 2016-04-09: 12:00:00 via INTRAVENOUS

## 2016-04-09 MED ORDER — MIDAZOLAM HCL 2 MG/2ML IJ SOLN
2.0000 mg | Freq: Once | INTRAMUSCULAR | Status: AC
  Administered 2016-04-09: 2 mg via INTRAVENOUS

## 2016-04-09 MED ORDER — NEOMYCIN-POLYMYXIN B GU IR SOLN
Status: DC | PRN
  Administered 2016-04-09: 25 mL

## 2016-04-09 MED ORDER — ESTROGENS, CONJUGATED 0.625 MG/GM VA CREA
TOPICAL_CREAM | VAGINAL | Status: AC
  Filled 2016-04-09: qty 30

## 2016-04-09 MED ORDER — ONDANSETRON HCL 4 MG/2ML IJ SOLN: 4.0000 mg | Freq: Once | INTRAMUSCULAR | Status: DC | PRN

## 2016-04-09 MED ORDER — PHENYLEPHRINE HCL 10 MG/ML IJ SOLN
INTRAMUSCULAR | Status: DC | PRN
  Administered 2016-04-09: 100 ug via INTRAVENOUS

## 2016-04-09 MED ORDER — METHYLENE BLUE 0.5 % INJ SOLN
INTRAVENOUS | Status: AC
  Filled 2016-04-09: qty 10

## 2016-04-09 MED ORDER — FAMOTIDINE 20 MG PO TABS
ORAL_TABLET | ORAL | Status: AC
  Filled 2016-04-09: qty 1

## 2016-04-09 MED ORDER — FENTANYL CITRATE (PF) 100 MCG/2ML IJ SOLN
INTRAMUSCULAR | Status: DC | PRN
  Administered 2016-04-09: 50 ug via INTRAVENOUS
  Administered 2016-04-09 (×2): 25 ug via INTRAVENOUS

## 2016-04-09 MED ORDER — CIPROFLOXACIN HCL 250 MG PO TABS: 250.0000 mg | ORAL_TABLET | Freq: Two times a day (BID) | ORAL | 0 refills | Status: DC

## 2016-04-09 MED ORDER — TRAMADOL HCL 50 MG PO TABS: 50.0000 mg | ORAL_TABLET | Freq: Four times a day (QID) | ORAL | 0 refills | Status: DC | PRN

## 2016-04-09 MED ORDER — GLYCOPYRROLATE 0.2 MG/ML IJ SOLN
INTRAMUSCULAR | Status: DC | PRN
  Administered 2016-04-09: 0.2 mg via INTRAVENOUS

## 2016-04-09 MED ORDER — ESTRADIOL 0.1 MG/GM VA CREA
TOPICAL_CREAM | VAGINAL | Status: DC | PRN
  Administered 2016-04-09: 1 via VAGINAL

## 2016-04-09 MED ORDER — MIDAZOLAM HCL 2 MG/2ML IJ SOLN
INTRAMUSCULAR | Status: AC
  Filled 2016-04-09: qty 2

## 2016-04-09 MED ORDER — FENTANYL CITRATE (PF) 100 MCG/2ML IJ SOLN
25.0000 ug | INTRAMUSCULAR | Status: DC | PRN
  Administered 2016-04-09 (×4): 25 ug via INTRAVENOUS

## 2016-04-09 MED ORDER — LIDOCAINE HCL (CARDIAC) 20 MG/ML IV SOLN
INTRAVENOUS | Status: DC | PRN
  Administered 2016-04-09: 100 mg via INTRAVENOUS

## 2016-04-09 MED ORDER — LIDOCAINE-EPINEPHRINE (PF) 1 %-1:200000 IJ SOLN
INTRAMUSCULAR | Status: DC | PRN
  Administered 2016-04-09: 7 mL

## 2016-04-09 MED ORDER — PROPOFOL 10 MG/ML IV BOLUS
INTRAVENOUS | Status: DC | PRN
  Administered 2016-04-09: 40 mg via INTRAVENOUS
  Administered 2016-04-09: 160 mg via INTRAVENOUS

## 2016-04-09 MED ORDER — FAMOTIDINE 20 MG PO TABS
20.0000 mg | ORAL_TABLET | Freq: Once | ORAL | Status: AC
  Administered 2016-04-09: 20 mg via ORAL

## 2016-04-09 MED ORDER — EPHEDRINE SULFATE 50 MG/ML IJ SOLN
INTRAMUSCULAR | Status: DC | PRN
  Administered 2016-04-09: 10 mg via INTRAVENOUS

## 2016-04-09 MED ORDER — FENTANYL CITRATE (PF) 100 MCG/2ML IJ SOLN
INTRAMUSCULAR | Status: AC
  Filled 2016-04-09: qty 2

## 2016-04-09 SURGICAL SUPPLY — 50 items
BAG URINE DRAINAGE (UROLOGICAL SUPPLIES) IMPLANT
BLADE CLIPPER SURG (BLADE) ×2 IMPLANT
BLADE SURG 10 STRL SS (BLADE) ×2 IMPLANT
BLADE SURG 15 STRL LF DISP TIS (BLADE) ×1 IMPLANT
BLADE SURG 15 STRL SS (BLADE) ×2
CANISTER SUCTION 1200CC (MISCELLANEOUS) ×2 IMPLANT
CATH FOLEY 2WAY SLVR  5CC 14FR (CATHETERS) ×1
CATH FOLEY 2WAY SLVR 5CC 14FR (CATHETERS) ×1 IMPLANT
CATH FOLEY SIL 2WAY 14FR5CC (CATHETERS) ×2 IMPLANT
COVER MAYO STAND STRL (DRAPES) ×4 IMPLANT
DRAPE LAP W/FLUID (DRAPES) ×2 IMPLANT
DRAPE UNDERBUTTOCKS STRL (DRAPE) ×2 IMPLANT
ELECT REM PT RETURN 9FT ADLT (ELECTROSURGICAL) ×2
ELECTRODE REM PT RTRN 9FT ADLT (ELECTROSURGICAL) ×1 IMPLANT
GAUZE SPONGE 4X4 16PLY XRAY LF (GAUZE/BANDAGES/DRESSINGS) IMPLANT
GLOVE BIO SURGEON STRL SZ7.5 (GLOVE) ×4 IMPLANT
GOWN STRL REUS W/ TWL LRG LVL3 (GOWN DISPOSABLE) ×2 IMPLANT
GOWN STRL REUS W/ TWL XL LVL3 (GOWN DISPOSABLE) ×1 IMPLANT
GOWN STRL REUS W/TWL LRG LVL3 (GOWN DISPOSABLE) ×4
GOWN STRL REUS W/TWL XL LVL3 (GOWN DISPOSABLE)
HOLDER FOLEY CATH W/STRAP (MISCELLANEOUS) ×2 IMPLANT
KIT ROOM TURNOVER WOR (KITS) ×2 IMPLANT
LIQUID BAND (GAUZE/BANDAGES/DRESSINGS) ×2 IMPLANT
NEEDLE HYPO 22GX1.5 SAFETY (NEEDLE) ×3 IMPLANT
PACK BASIN MINOR ARMC (MISCELLANEOUS) ×2 IMPLANT
PENCIL BUTTON HOLSTER BLD 10FT (ELECTRODE) ×2 IMPLANT
PLUG CATH AND CAP STER (CATHETERS) ×2 IMPLANT
RETRACTOR STERILE 25.8CMX11.3 (INSTRUMENTS) ×1 IMPLANT
RING RETRACTOR 18.6X8.9 3309G (MISCELLANEOUS) ×1 IMPLANT
RING RETRACTOR 28.3X18.3 3308G (MISCELLANEOUS) ×1 IMPLANT
SET CYSTO W/LG BORE CLAMP LF (SET/KITS/TRAYS/PACK) ×2 IMPLANT
SLING SUPRIS RETROPUBIC KIT (Sling) ×2 IMPLANT
SPONGE XRAY 4X4 16PLY STRL (MISCELLANEOUS) ×1 IMPLANT
SURGILUBE 2OZ TUBE FLIPTOP (MISCELLANEOUS) ×2 IMPLANT
SUT VIC AB 2-0 CT1 27 (SUTURE) ×4
SUT VIC AB 2-0 CT1 TAPERPNT 27 (SUTURE) ×2 IMPLANT
SUT VIC AB 2-0 SH 27 (SUTURE)
SUT VIC AB 2-0 SH 27XBRD (SUTURE) IMPLANT
SUT VIC AB 4-0 SH 27 (SUTURE) ×2
SUT VIC AB 4-0 SH 27XANBCTRL (SUTURE) IMPLANT
SUT VICRYL 4-0 PS2 18IN ABS (SUTURE) ×1 IMPLANT
SYR 20CC LL (SYRINGE) ×1 IMPLANT
SYR BULB IRRIG 60ML STRL (SYRINGE) ×2 IMPLANT
SYR CONTROL 10ML (SYRINGE) ×2 IMPLANT
SYRINGE 10CC LL (SYRINGE) ×2 IMPLANT
TRAY PREP VAG/GEN (MISCELLANEOUS) ×1 IMPLANT
TUBING CONNECTING 10 (TUBING) ×4 IMPLANT
WATER STERILE IRR 1000ML POUR (IV SOLUTION) IMPLANT
WATER STERILE IRR 3000ML UROMA (IV SOLUTION) ×2 IMPLANT
YANKAUER SUCT BULB TIP NO VENT (SUCTIONS) ×2 IMPLANT

## 2016-04-09 NOTE — Op Note (Signed)
Preoperative diagnosis: Stress urinary incontinence Postoperative diagnosis: Stress urinary incontinence Surgery: Sling cystourethropexy and cystoscopy Surgeon: Dr. Nicki Reaper Raheim Beutler  The patient has the above diagnoses and consented above procedure. Extra care was taken with leg positioning to minimize the risk of compartment syndrome and neuropathy and deep vein thrombosis. Usual retractors were utilized. Preoperative antibiotics were given. 21 cm incisions were made 1 fingerbreadth above the symphysis pubis 1.5 cm lateral to the midline  She did have redundant mobile suburethral tissue but I was diligent in marking a 2 cm suburethral and mid urethral incision. 4 mL of lidocaine epinephrine was utilized. Appropriate depth incision was made. With spreading I dissected laterally at the level of the urethrovesical angle.  With the bladder empty I passed the trocar on top of and along the back of the symphysis pubis onto the pulp of my index finger bilaterally. I use my usual box technique  The patient was cystoscoped. There is no injury to bladder or urethra. It was efflux of clear urine bilaterally. There was no movement of the bladder with movement of the trocar.  With the bladder empty I attached the sling mesh anatomically and brought up through the retropubic space with the described technique. I tensioned the sling over the fat part of a moderate size Kelly clamp. There was hyper mobility of the sling. There was no springback effect. I was very pleased with the location and tension of the sling.  The vaginal incision was closed with running 2-0 Vicryl on a CT1 suture followed by 2 interrupted sutures.  Abdominal East mesh was cut. I closed each incision with interrupted 4-0 Vicryl and Dermabond  Leg position was excellent. I was very pleased with the surgery. Vaginal pack was inserted. Hopefully the patient can reach her treatment goal especially with her mixed component

## 2016-04-09 NOTE — Progress Notes (Signed)
Pre op Physical exam normal Normal heart sounds and rate; no respiratory distress; chest clear This is an addendum to the pre op H&P

## 2016-04-09 NOTE — Anesthesia Preprocedure Evaluation (Signed)
Anesthesia Evaluation  Patient identified by MRN, date of birth, ID band Patient awake    Reviewed: Allergy & Precautions, H&P , NPO status , Patient's Chart, lab work & pertinent test results, reviewed documented beta blocker date and time   Airway Mallampati: II  TM Distance: >3 FB Neck ROM: full    Dental  (+) Teeth Intact   Pulmonary neg pulmonary ROS, asthma , Current Smoker,    Pulmonary exam normal        Cardiovascular Exercise Tolerance: Good + Peripheral Vascular Disease and +CHF  (-) Orthopnea and (-) PND negative cardio ROS Normal cardiovascular exam+ Valvular Problems/Murmurs  Rhythm:regular Rate:Normal     Neuro/Psych  Headaches, PSYCHIATRIC DISORDERS  Neuromuscular disease negative neurological ROS  negative psych ROS   GI/Hepatic negative GI ROS, Neg liver ROS, GERD  Medicated,  Endo/Other  negative endocrine ROSdiabetesHypothyroidism   Renal/GU Renal diseasenegative Renal ROS  negative genitourinary   Musculoskeletal   Abdominal   Peds  Hematology negative hematology ROS (+) anemia ,   Anesthesia Other Findings   Reproductive/Obstetrics negative OB ROS                             Anesthesia Physical Anesthesia Plan  ASA: III  Anesthesia Plan: General LMA   Post-op Pain Management:    Induction:   Airway Management Planned:   Additional Equipment:   Intra-op Plan:   Post-operative Plan:   Informed Consent: I have reviewed the patients History and Physical, chart, labs and discussed the procedure including the risks, benefits and alternatives for the proposed anesthesia with the patient or authorized representative who has indicated his/her understanding and acceptance.     Plan Discussed with: CRNA  Anesthesia Plan Comments:         Anesthesia Quick Evaluation

## 2016-04-09 NOTE — Interval H&P Note (Signed)
History and Physical Interval Note:  04/09/2016 12:10 PM  Alexis Christensen  has presented today for surgery, with the diagnosis of stress incontinence  The various methods of treatment have been discussed with the patient and family. After consideration of risks, benefits and other options for treatment, the patient has consented to  Procedure(s): CYSTOSCOPY (N/A) PUBO-VAGINAL SLING/ RETROPUBIC SLING (N/A) as a surgical intervention .  The patient's history has been reviewed, patient examined, no change in status, stable for surgery.  I have reviewed the patient's chart and labs.  Questions were answered to the patient's satisfaction.     Arvis Zwahlen A

## 2016-04-09 NOTE — Transfer of Care (Signed)
Immediate Anesthesia Transfer of Care Note  Patient: Alexis Christensen  Procedure(s) Performed: Procedure(s): CYSTOSCOPY (N/A) PUBO-VAGINAL SLING/ RETROPUBIC SLING (N/A)  Patient Location: PACU  Anesthesia Type:General  Level of Consciousness: awake, alert , oriented and patient cooperative  Airway & Oxygen Therapy: Patient Spontanous Breathing and Patient connected to face mask oxygen  Post-op Assessment: Report given to RN, Post -op Vital signs reviewed and stable and Patient moving all extremities X 4  Post vital signs: Reviewed and stable  Last Vitals:  Vitals:   04/09/16 1114  BP: 122/87  Pulse: 87  Resp: 18  Temp: 36.4 C    Last Pain:  Vitals:   04/09/16 1114  TempSrc: Tympanic         Complications: No apparent anesthesia complications

## 2016-04-09 NOTE — OR Nursing (Signed)
Dr Matilde Sprang notified of PVR of 253cc.  Pt to be disch home and do self cath if needed and RN from [facility] ewill call tomorrow to check on pt

## 2016-04-09 NOTE — Anesthesia Procedure Notes (Signed)
Procedure Name: LMA Insertion Date/Time: 04/09/2016 12:43 PM Performed by: Silvana Newness Pre-anesthesia Checklist: Patient identified, Emergency Drugs available, Suction available, Patient being monitored and Timeout performed Patient Re-evaluated:Patient Re-evaluated prior to inductionOxygen Delivery Method: Circle system utilized Preoxygenation: Pre-oxygenation with 100% oxygen Intubation Type: IV induction Ventilation: Mask ventilation without difficulty LMA: LMA inserted LMA Size: 4.0 Number of attempts: 1 Placement Confirmation: positive ETCO2 and breath sounds checked- equal and bilateral Tube secured with: Tape Dental Injury: Teeth and Oropharynx as per pre-operative assessment

## 2016-04-09 NOTE — Progress Notes (Signed)
Report given to [facility] RN.

## 2016-04-09 NOTE — H&P (Signed)
Chief Complaint  Patient presents with  . Urinary Incontinence    66month follow up    HPI: I initially assess the patient a number of months ago Ms. Alexis Christensen was assessed by Larene [facility] for stress urinary incontinence and is here for reevaluation [name] leaks with coughing sneezing bending or lifting and has more coughing spells recently. She has urge incontinence when she holds it too long and they're both significant. She denies enuresis. She consulted 6 pads a day. She has postvoid dribbling She voids every 30-60 minutes cannot sit through and through are moving. She is ankle edema gets up 4 times a night. 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 Bladder capacity was 470 mL. Bladder had low pressure his bili and she did not leak. She had mild stress incontinence at 150 mL at 97 cm of water at 300 mL at 124 cm water she very mild leakage. She generated low pressure contraction during voluntary voiding. EMG activity increased during the voiding phase. Bladder neck dissented approximate 2 cm. There was a lot of artifact noted. She was doing a lot of straining.  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 She failed Vesicare. I gave her a trial of Toviaz and the beta 3 agonists  Medications failed The patient said stress incontinence for many years and and it is the urge component that has worsened over time making her come see [name]. Recently the urge component and even some foot on the floor syndrome has worsened. When she coughs she leaks a small amount and then she leaks a higher volume and likely is triggering  I drew her a picture and we discussed pathophysiology of her incontinence.  I talked about watchful waiting versus sling and went through all complications as noted on my template. Turned about her persistent overactive bladder mesh issues were discussed as well as pelvic pain.  I talked her about percutaneous tibial nerve stimulation versus sacral nerve modulation versus botulinum toxin as well as our 2 studies. She understands and could live with her stress component I would not be helped by these treatments  I think she made a good decision and would like to proceed with a sling. He is hoping this will address both components. She knows that she can have refractory OAB therapies post sling if the OAB component persists or worsens. She would not be a candidate for one of our current studies  She will be taught clean intermittent catheterization as per protocol and we will call her  25 minutes or longer was used for face-to-face discussion           Past Medical History:  Diagnosis Date  . Adopted   . Anemia   . Anxiety   . Arthritis   . Asthma    WELL CONTROLLED-NO INHALER  . Bipolar 1 disorder ([place])   . CHF (congestive heart failure) ([place])   . Chronic kidney disease    H/O KIDNEY STONES  . Depression   . Diabetes mellitus without complication ([place])    BORDERLINE  . DVT (deep venous thrombosis) ([place]) 2016   RIGHT LEG  . Edema leg   . Effusion of  knee 12/30/2013  . Family history of adverse reaction to anesthesia    ADOPTED  . GERD (gastroesophageal reflux disease)   . Headache(784.0)    MIGRAINES  . Heart burn   . Heart murmur   . History of kidney stones   . HLD (hyperlipidemia)   . Hx MRSA infection   . Hypothyroidism   . Post traumatic stress disorder (PTSD)   . Stress incontinence   . Thyroid disease   . Urge incontinence     Surgical History: Past Surgical History:  Procedure Laterality Date  . ABDOMINAL HYSTERECTOMY    . ANKLE SURGERY    . CHOLECYSTECTOMY    . KNEE ARTHROSCOPY     . KNEE ARTHROSCOPY WITH LATERAL RELEASE Left 10/18/2015   Procedure: KNEE ARTHROSCOPY LATERAL AND PARTIAL SYNOVECTOMY;  Surgeon: Hessie Knows, MD;  Location: [facility] ORS;  Service: Orthopedics;  Laterality: Left;  . Lymph Node removal  2015   Neck  . REPLACEMENT TOTAL KNEE Right   . SHOULDER SURGERY Right 2014  . TUBAL LIGATION    . WRIST SURGERY      Home Medications:        Medication List           Accurate as of 03/09/16 11:12 AM. Always use your most recent med list.           clobetasol cream 0.05 % Commonly known as:  TEMOVATE Apply 1 application topically 2 (two) times daily.  doxepin 50 MG capsule Commonly known as:  SINEQUAN Take 50 mg by mouth as needed.  EPIPEN 2-PAK 0.3 mg/0.3 mL Soaj injection Generic drug:  EPINEPHrine  fluticasone 50 MCG/ACT nasal spray Commonly known as:  FLONASE Place 1 spray into both nostrils 2 (two) times daily.  gabapentin 600 MG tablet Commonly known as:  NEURONTIN Take 600 mg by mouth 3 (three) times daily.  hydrOXYzine 25 MG tablet Commonly known as:  ATARAX/VISTARIL Take 25 mg by mouth every 8 (eight) hours as needed.  levocetirizine 5 MG tablet Commonly known as:  XYZAL Take 5 mg by mouth every evening.  levothyroxine 150 MCG tablet Commonly known as:  SYNTHROID, LEVOTHROID  PATADAY 0.2 % Soln Generic drug:  Olopatadine HCl  SUBOXONE 8-2 MG Film Generic drug:  Buprenorphine HCl-Naloxone HCl DOSSOLVE 1 FILM UNDER THE TONGUE QD-AM  triamcinolone cream 0.1 % Commonly known as:  KENALOG Apply topically. Reported on 08/26/2015      Allergies:       Allergies  Allergen Reactions  . Geodon [Ziprasidone Hydrochloride] Other (See Comments)    Numbness ,sob, headaches, blurred vision  . Ziprasidone Hcl Anaphylaxis    Other reaction(s): Other (See Comments), Unknown Numbness ,sob, headaches, blurred vision Numbness ,sob, headaches, blurred vision  . Strawberry (Diagnostic)     rash  . Sulfa  Antibiotics Hives  . Ace Inhibitors Rash  . Aripiprazole Rash  . Erythromycin Rash and Hives  . Hydromorphone Rash  . Penicillins Rash    Family History:      Family History  Problem Relation Age of Onset  . Adopted: Yes    Social History:  reports that she has been smoking Cigarettes.  She has a 5.00 pack-year smoking history. She has never used smokeless tobacco. She reports that she drinks alcohol. She reports that she does not use drugs.  ROS: UROLOGY Frequent Urination?: Yes Hard to postpone urination?: Yes Burning/pain with urination?: No Get up at night to urinate?: Yes Leakage of urine?: Yes Urine stream starts and  stops?: No Trouble starting stream?: No Do you have to strain to urinate?: No Blood in urine?: No Urinary tract infection?: No Sexually transmitted disease?: No Injury to kidneys or bladder?: No Painful intercourse?: No Weak stream?: No Currently pregnant?: No Vaginal bleeding?: No Last menstrual period?: n  Gastrointestinal Nausea?: No Vomiting?: No Indigestion/heartburn?: Yes Diarrhea?: Yes Constipation?: No  Constitutional Fever: No Night sweats?: Yes Weight loss?: Yes Fatigue?: No  Skin Skin rash/lesions?: Yes Itching?: Yes  Eyes Blurred vision?: No Double vision?: No  Ears/Nose/Throat Sore throat?: No Sinus problems?: Yes  Hematologic/Lymphatic Swollen glands?: No Easy bruising?: Yes  Cardiovascular Leg swelling?: Yes Chest pain?: No  Respiratory Cough?: No Shortness of breath?: No  Endocrine Excessive thirst?: Yes  Musculoskeletal Back pain?: No Joint pain?: No  Neurological Headaches?: Yes Dizziness?: No  Psychologic Depression?: Yes Anxiety?: Yes  Physical Exam: BP 122/87   Pulse 76   Ht 5\' 5"  (1.651 m)   Wt 228 lb (103.4 kg)   BMI 37.94 kg/m    Laboratory Data: RecentLabs       Lab Results  Component Value Date   WBC 6.2 08/13/2015   HGB 12.9 10/18/2015   HCT 38.0  10/18/2015   MCV 82.2 08/13/2015   PLT 364 08/13/2015      RecentLabs       Lab Results  Component Value Date   CREATININE 0.60 11/30/2015      RecentLabs  No results found for: PSA    RecentLabs  No results found for: TESTOSTERONE    RecentLabs  No results found for: HGBA1C    Urinalysis Labs(Brief)          Component Value Date/Time   COLORURINE YELLOW (A) 08/13/2015 1455   APPEARANCEUR Cloudy (A) 08/26/2015 1109   LABSPEC 1.018 08/13/2015 1455   PHURINE 7.0 08/13/2015 1455   GLUCOSEU Negative 08/26/2015 1109   HGBUR NEGATIVE 08/13/2015 1455   BILIRUBINUR Negative 08/26/2015 1109   KETONESUR NEGATIVE 08/13/2015 1455   PROTEINUR 1+ (A) 08/26/2015 1109   PROTEINUR NEGATIVE 08/13/2015 1455   UROBILINOGEN 1.0 06/18/2011 1258   NITRITE Negative 08/26/2015 1109   NITRITE NEGATIVE 08/13/2015 1455   LEUKOCYTESUR Negative 08/26/2015 1109      Pertinent Imaging: none  Assessment & Plan:  Mixed incontinence; see above dictation  After a thorough review of the management options for the patient's condition the patient  elected to proceed with surgical therapy as noted above. We have discussed the potential benefits and risks of the procedure, side effects of the proposed treatment, the likelihood of the patient achieving the goals of the procedure, and any potential problems that might occur during the procedure or recuperation. Informed consent has been obtained.  There are no diagnoses linked to this encounter.  No Follow-up on file.  Maud Rubendall A, MD

## 2016-04-09 NOTE — Discharge Instructions (Signed)
AMBULATORY SURGERY  DISCHARGE INSTRUCTIONS   1) The drugs that you were given will stay in your system until tomorrow so for the next 24 hours you should not:  A) Drive an automobile B) Make any legal decisions C) Drink any alcoholic beverage   2) You may resume regular meals tomorrow.  Today it is better to start with liquids and gradually work up to solid foods.  You may eat anything you prefer, but it is better to start with liquids, then soup and crackers, and gradually work up to solid foods.   3) Please notify your doctor immediately if you have any unusual bleeding, trouble breathing, redness and pain at the surgery site, drainage, fever, or pain not relieved by medication.    4) Additional Instructions:        Please contact your physician with any problems or Same Day Surgery at [phone], Monday through Friday 6 am to 4 pm, or [place] [facility] number at [phone].I have reviewed discharge instructions in detail with the patient. They will follow-up with me or their physician as scheduled. My nurse will also be calling the patients as per protocol.

## 2016-04-10 ENCOUNTER — Encounter: Payer: Self-pay | Admitting: Urology

## 2016-04-13 MED FILL — Neomycin-Polymyxin B GU Irrigation Soln: Qty: 25 | Status: AC

## 2016-04-16 NOTE — Anesthesia Postprocedure Evaluation (Signed)
Anesthesia Post Note  Patient: Remmi M Pascucci  Procedure(s) Performed: Procedure(s) (LRB): CYSTOSCOPY (N/A) PUBO-VAGINAL SLING/ RETROPUBIC SLING (N/A)  Patient location during evaluation: PACU Anesthesia Type: General Level of consciousness: awake and alert Pain management: pain level controlled Vital Signs Assessment: post-procedure vital signs reviewed and stable Respiratory status: spontaneous breathing, nonlabored ventilation, respiratory function stable and patient connected to nasal cannula oxygen Cardiovascular status: blood pressure returned to baseline and stable Postop Assessment: no signs of nausea or vomiting Anesthetic complications: no    Last Vitals:  Vitals:   04/09/16 1502 04/09/16 1520  BP:  130/78  Pulse: (!) 59 (!) 59  Resp:  16  Temp:  36.6 C    Last Pain:  Vitals:   04/09/16 1520  TempSrc: Tympanic  PainSc: 2                  Molli Barrows

## 2016-04-26 ENCOUNTER — Encounter (INDEPENDENT_AMBULATORY_CARE_PROVIDER_SITE_OTHER): Payer: Self-pay | Admitting: Vascular Surgery

## 2016-04-26 ENCOUNTER — Ambulatory Visit (INDEPENDENT_AMBULATORY_CARE_PROVIDER_SITE_OTHER): Payer: Medicare Other | Admitting: Vascular Surgery

## 2016-04-26 DIAGNOSIS — I872 Venous insufficiency (chronic) (peripheral): Secondary | ICD-10-CM

## 2016-04-26 DIAGNOSIS — I890 Lymphedema, not elsewhere classified: Secondary | ICD-10-CM

## 2016-04-26 DIAGNOSIS — M7989 Other specified soft tissue disorders: Secondary | ICD-10-CM

## 2016-04-26 HISTORY — DX: Venous insufficiency (chronic) (peripheral): I87.2

## 2016-04-26 HISTORY — DX: Lymphedema, not elsewhere classified: I89.0

## 2016-04-26 HISTORY — DX: Other specified soft tissue disorders: M79.89

## 2016-04-26 NOTE — Progress Notes (Signed)
MRN : [id number]  Alexis Christensen is a 45 y.o. (19-Feb-1971) female who presents with chief complaint of  Chief Complaint  Patient presents with  . Follow-up  .  History of Present Illness:  The patient returns to the office for followup evaluation regarding leg swelling.  The swelling has persisted but with the lymph pump the patient states the swelling is much better controlled. The pain associated with swelling is essentially eliminated. There have not been any interval development of a ulcerations or wounds.  No episodes of cellulitis or infection over the past 12 months  The patient denies problems with the pump, noting it is working well and the leggings are in good condition.  Since the previous visit the patient has been wearing graduated compression stockings and using the lymph pump on a routine basis and  has noted significant improvement in the lymphedema.   Patient stated the lymph pump has been a very positive factor in her care.    Current Outpatient Prescriptions  Medication Sig Dispense Refill  . ARIPiprazole (ABILIFY IM) Inject 400 Doses into the muscle every 30 (thirty) days.    . clobetasol cream (TEMOVATE) [id number] % Apply 1 application topically 2 (two) times daily.    Marland Kitchen doxepin (SINEQUAN) 50 MG capsule Take 50 mg by mouth as needed.     Marland Kitchen EPINEPHrine (EPIPEN 2-PAK) 0.3 mg/0.3 mL IJ SOAJ injection     . fluticasone (FLONASE) 50 MCG/ACT nasal spray Place 1 spray into both nostrils 2 (two) times daily.     Marland Kitchen gabapentin (NEURONTIN) 600 MG tablet Take 600 mg by mouth 3 (three) times daily.    . hydrOXYzine (ATARAX/VISTARIL) 25 MG tablet Take 25 mg by mouth every 8 (eight) hours as needed.     Marland Kitchen levocetirizine (XYZAL) 5 MG tablet Take 5 mg by mouth every evening.     Marland Kitchen levothyroxine (SYNTHROID, LEVOTHROID) 150 MCG tablet     . Olopatadine HCl (PATADAY) 0.2 % SOLN     . SUBOXONE 8-2 MG FILM DOSSOLVE 1 FILM UNDER THE TONGUE QD-AM  0  . traMADol (ULTRAM) 50 MG tablet Take 1  tablet (50 mg total) by mouth every 6 (six) hours as needed. 20 tablet 0  . traMADol (ULTRAM) 50 MG tablet Take 1 tablet (50 mg total) by mouth every 6 (six) hours as needed. 30 tablet 0  . triamcinolone cream (KENALOG) 0.1 % Apply topically. Reported on 08/26/2015    . ciprofloxacin (CIPRO) 250 MG tablet Take 1 tablet (250 mg total) by mouth 2 (two) times daily. (Patient not taking: Reported on 04/26/2016) 10 tablet 0   No current facility-administered medications for this visit.     Past Medical History:  Diagnosis Date  . Adopted   . Anemia   . Anxiety   . Arthritis   . Asthma    WELL CONTROLLED-NO INHALER  . Bipolar 1 disorder ([place])   . CHF (congestive heart failure) ([place])   . Chronic kidney disease    H/O KIDNEY STONES  . Depression   . Diabetes mellitus without complication ([place])    BORDERLINE diet control  . DVT (deep venous thrombosis) ([place]) 2016   RIGHT LEG  . Edema leg   . Effusion of knee 12/30/2013  . Family history of adverse reaction to anesthesia    ADOPTED  . GERD (gastroesophageal reflux disease)   . Headache(784.0)    MIGRAINES  . Heart burn   . Heart murmur   . History  of kidney stones   . HLD (hyperlipidemia)   . Hx MRSA infection   . Hypothyroidism   . Lower extremity edema   . Post traumatic stress disorder (PTSD)   . PVD (peripheral vascular disease) ([place])   . Seasonal allergies   . Stress incontinence   . Thyroid disease   . Urge incontinence     Past Surgical History:  Procedure Laterality Date  . ABDOMINAL HYSTERECTOMY    . ANKLE SURGERY    . CHOLECYSTECTOMY    . CYSTOSCOPY N/A 04/09/2016   Procedure: CYSTOSCOPY;  Surgeon: Bjorn Loser, MD;  Location: [facility];  Service: Urology;  Laterality: N/A;  . JOINT REPLACEMENT    . KNEE ARTHROSCOPY    . KNEE ARTHROSCOPY WITH LATERAL RELEASE Left 10/18/2015   Procedure: KNEE ARTHROSCOPY LATERAL AND PARTIAL SYNOVECTOMY;  Surgeon: Hessie Knows, MD;  Location: [facility];  Service: Orthopedics;   Laterality: Left;  . Lymph Node removal  2015   Neck  . PUBOVAGINAL SLING N/A 04/09/2016   Procedure: PUBO-VAGINAL SLING/ RETROPUBIC SLING;  Surgeon: Bjorn Loser, MD;  Location: [facility];  Service: Urology;  Laterality: N/A;  . REPLACEMENT TOTAL KNEE Right   . SHOULDER SURGERY Right 2014  . TUBAL LIGATION    . WRIST SURGERY      Social History Social History  Substance Use Topics  . Smoking status: Former Smoker    Packs/day: 0.25    Years: 20.00    Types: Cigarettes  . Smokeless tobacco: Former Systems developer     Comment: [place]  . Alcohol use 0.0 oz/week     Comment: occasional     Family History Family History  Problem Relation Age of Onset  . Adopted: Yes    Allergies  Allergen Reactions  . Geodon [Ziprasidone Hydrochloride] Other (See Comments)    Numbness ,sob, headaches, blurred vision  . Ziprasidone Hcl Anaphylaxis    Other reaction(s): Other (See Comments), Unknown Numbness ,sob, headaches, blurred vision Numbness ,sob, headaches, blurred vision  . Strawberry (Diagnostic)     rash  . Sulfa Antibiotics Hives  . Ace Inhibitors Rash  . Aripiprazole Rash  . Erythromycin Rash and Hives  . Hydromorphone Rash  . Lamictal [Lamotrigine]   . Penicillins Rash     REVIEW OF SYSTEMS (Negative unless checked)  Constitutional: [] Weight loss  [] Fever  [] Chills Cardiac: [] Chest pain   [] Chest pressure   [] Palpitations   [] Shortness of breath at rest   [] Shortness of breath with exertion. Vascular:  [] Pain in legs with walking   [] Pain in legs at rest    [] Pain in feet at rest    [] History of DVT   [] Phlebitis   [x] Swelling in legs   [] Varicose veins   [] Non-healing ulcers Pulmonary:   [] Uses home oxygen   [] Productive cough   [] Hemoptysis   [] Wheeze  [] COPD   [] Asthma Neurologic:  [] Dizziness  [] Blackouts   [] Seizures   [] History of stroke   [] History of TIA  [] Aphasia   [] Temporary blindness   [] Dysphagia   [] Weakness or numbness in arm   [] Weakness or numbness in  leg Musculoskeletal:  [] Arthritis   [] Joint swelling   [] Joint pain   [] Low back pain Hematologic:  [] Easy bruising  [] Easy bleeding   [] Hypercoagulable state   [] Anemic   Gastrointestinal:  [] Blood in stool   [] Vomiting blood  [] Gastroesophageal reflux/heartburn   [] Difficulty swallowing. Genitourinary:  [] Chronic kidney disease   [] Difficult urination  [] Frequent urination  [] Burning with urination   []   Blood in urine Skin:  [] Rashes   [] Ulcers   Psychological:  [x] History of anxiety   []  History of major depression.    Physical Examination  Vitals:   04/26/16 1529  BP: 107/66  Pulse: (!) 52  Resp: 17  Weight: 220 lb (99.8 kg)  Height: 5\' 5"  (1.651 m)   Body mass index is 36.61 kg/m. Gen:  WD/WN, NAD Head: [place]/AT, No temporalis wasting. Ear/Nose/Throat: Hearing grossly intact, nares w/o erythema or drainage, trachea midline Eyes: PERR, EOM appear normal. Sclera non-icteric Neck: Supple, no nuchal rigidity.  No bruit or JVD.  Pulmonary:  Good air movement, equal and clear to auscultation bilaterally.  Cardiac: RRR, normal S1, S2, no Murmurs, rubs or gallops. Vascular:   2+ pitting edema bilateral lower extremities, mild venous stasis changes noted bilaterally Vessel Right Left  Radial Palpable Palpable  Ulnar Palpable Palpable  Brachial Palpable Palpable  Carotid Palpable Palpable  Aorta Not palpable N/A  Femoral Palpable Palpable  Popliteal Palpable Palpable  PT Palpable Palpable  DP Palpable Palpable   Gastrointestinal: soft, non-rigid/non-distended. No guarding.  Musculoskeletal: M/S 5/5 throughout.  No deformity or atrophy. Neurologic: CN 2-12 intact. Pain and light touch intact in extremities.  Speech is fluent. Motor exam as listed above. Psychiatric: Judgment intact, Mood & affect appropriate for pt's clinical situation. Dermatologic: No rashes or ulcers noted.  No signs consistent with cellulitis.  CBC Lab Results  Component Value Date   WBC 8.5 04/03/2016    HGB 11.3 (L) 04/03/2016   HCT 33.8 (L) 04/03/2016   MCV 82.4 04/03/2016   PLT 282 04/03/2016    BMET    Component Value Date/Time   NA 139 04/03/2016 1305   NA 138 11/07/2012 0517   K 3.6 04/03/2016 1305   K 3.9 11/07/2012 0517   CL 106 04/03/2016 1305   CL 109 (H) 11/07/2012 0517   CO2 26 04/03/2016 1305   CO2 25 11/07/2012 0517   GLUCOSE 102 (H) 04/03/2016 1305   GLUCOSE 91 11/07/2012 0517   BUN 11 04/03/2016 1305   BUN 7 11/07/2012 0517   CREATININE 0.70 04/03/2016 1305   CREATININE 0.76 11/07/2012 0517   CALCIUM 9.0 04/03/2016 1305   CALCIUM 7.9 (L) 11/07/2012 0517   GFRNONAA >60 04/03/2016 1305   GFRNONAA >60 11/07/2012 0517   GFRAA >60 04/03/2016 1305   GFRAA >60 11/07/2012 0517   CrCl cannot be calculated (Patient's most recent lab result is older than the maximum 21 days allowed.).  COAG Lab Results  Component Value Date   INR 1.03 04/03/2016    Radiology Dg Hand Complete Left  Result Date: 03/30/2016 CLINICAL DATA:  Acute onset of left hand and wrist pain. Initial encounter. EXAM: LEFT HAND - COMPLETE 3+ VIEW COMPARISON:  None. FINDINGS: There is no evidence of fracture or dislocation. The joint spaces are preserved. The carpal rows are intact, and demonstrate normal alignment. The soft tissues are unremarkable in appearance. IMPRESSION: No evidence of fracture or dislocation. Electronically Signed   By: Garald Balding M.D.   On: 03/30/2016 20:30      Assessment/Plan 1. Lymphedema  No surgery or intervention at this point in time.    I have reviewed my discussion with the patient regarding lymphedema and why it  causes symptoms.  Patient will continue wearing graduated compression stockings class 1 (20-30 mmHg) on a daily basis a prescription was given. The patient will  beginning wearing the stockings first thing in the morning and removing them in  the evening. The patient is instructed specifically not to sleep in the stockings.   In addition, behavioral  modification throughout the day will be continued.  This will include frequent elevation (such as in a recliner), use of over the counter pain medications as needed and exercise such as walking.  I have reviewed systemic causes for chronic edema such as liver, kidney and cardiac etiologies and there does not appear to be any significant changes in these organ systems over the past year.  The patient is under the impression that these organ systems are all stable and unchanged.    The patient will continue aggressive use of the  lymph pump.  This will continue to improve the edema control and prevent sequela such as ulcers and infections.   The patient will follow-up with me on an annual basis.    2. Chronic venous insufficiency See plan above continue compression   3. Swelling of limb Plan per #1    Hortencia Pilar, MD  04/26/2016 5:36 PM    This note was created with Dragon medical transcription system.  Any errors from dictation are purely unintentional

## 2016-05-23 DIAGNOSIS — M545 Low back pain, unspecified: Secondary | ICD-10-CM | POA: Insufficient documentation

## 2016-05-23 DIAGNOSIS — M26629 Arthralgia of temporomandibular joint, unspecified side: Secondary | ICD-10-CM | POA: Insufficient documentation

## 2016-05-28 ENCOUNTER — Ambulatory Visit (INDEPENDENT_AMBULATORY_CARE_PROVIDER_SITE_OTHER): Payer: Medicare Other | Admitting: Urology

## 2016-05-28 ENCOUNTER — Encounter: Payer: Self-pay | Admitting: Urology

## 2016-05-28 VITALS — BP 98/63 | HR 62 | Ht 65.0 in | Wt 216.4 lb

## 2016-05-28 DIAGNOSIS — N3946 Mixed incontinence: Secondary | ICD-10-CM

## 2016-05-28 DIAGNOSIS — N393 Stress incontinence (female) (male): Secondary | ICD-10-CM

## 2016-05-28 LAB — BLADDER SCAN AMB NON-IMAGING: SCAN RESULT: 0

## 2016-05-28 NOTE — Progress Notes (Signed)
05/28/2016 11:47 AM   Alexis Christensen 10-28-1970 [id number]  Referring provider: Perrin Maltese, MD [address] [place], [place] [address]  Chief Complaint  Patient presents with  . Routine Post Op    bladder sling     HPI: The patient had a sling on September 25 or mixed incontinence. She had failed medication.  The patient no longer has stress incontinence. If she holds it too long she can have some urgency incontinence but she says the urgency is better. She is clinically not infected. Some days she will wear 1 pad just in case  On pelvic examination her incision is well-healed. There is no sling extrusion. She had no stress incontinence  Her residual today was 0 mL     PMH: Past Medical History:  Diagnosis Date  . Adopted   . Anemia   . Anxiety   . Arthritis   . Asthma    WELL CONTROLLED-NO INHALER  . Bipolar 1 disorder ([place])   . CHF (congestive heart failure) ([place])   . Chronic kidney disease    H/O KIDNEY STONES  . Depression   . Diabetes mellitus without complication ([place])    BORDERLINE diet control  . DVT (deep venous thrombosis) ([place]) 2016   RIGHT LEG  . Edema leg   . Effusion of knee 12/30/2013  . Family history of adverse reaction to anesthesia    ADOPTED  . GERD (gastroesophageal reflux disease)   . Headache(784.0)    MIGRAINES  . Heart burn   . Heart murmur   . History of kidney stones   . HLD (hyperlipidemia)   . Hx MRSA infection   . Hypothyroidism   . Lower extremity edema   . Post traumatic stress disorder (PTSD)   . PVD (peripheral vascular disease) ([place])   . Seasonal allergies   . Stress incontinence   . Thyroid disease   . Urge incontinence     Surgical History: Past Surgical History:  Procedure Laterality Date  . ABDOMINAL HYSTERECTOMY    . ANKLE SURGERY    . CHOLECYSTECTOMY    . CYSTOSCOPY N/A 04/09/2016   Procedure: CYSTOSCOPY;  Surgeon: Bjorn Loser, MD;  Location: [facility];  Service: Urology;  Laterality: N/A;  .  JOINT REPLACEMENT    . KNEE ARTHROSCOPY    . KNEE ARTHROSCOPY WITH LATERAL RELEASE Left 10/18/2015   Procedure: KNEE ARTHROSCOPY LATERAL AND PARTIAL SYNOVECTOMY;  Surgeon: Hessie Knows, MD;  Location: [facility];  Service: Orthopedics;  Laterality: Left;  . Lymph Node removal  2015   Neck  . PUBOVAGINAL SLING N/A 04/09/2016   Procedure: PUBO-VAGINAL SLING/ RETROPUBIC SLING;  Surgeon: Bjorn Loser, MD;  Location: [facility];  Service: Urology;  Laterality: N/A;  . REPLACEMENT TOTAL KNEE Right   . SHOULDER SURGERY Right 2014  . TUBAL LIGATION    . WRIST SURGERY      Home Medications:    Medication List       Accurate as of 05/28/16 11:47 AM. Always use your most recent med list.          ABILIFY IM Inject 400 Doses into the muscle every 30 (thirty) days.   ciprofloxacin 250 MG tablet Commonly known as:  CIPRO Take 1 tablet (250 mg total) by mouth 2 (two) times daily.   clobetasol cream 0.05 % Commonly known as:  TEMOVATE Apply 1 application topically 2 (two) times daily.   doxepin 50 MG capsule Commonly known as:  SINEQUAN Take 50 mg by  mouth as needed.   EPIPEN 2-PAK 0.3 mg/0.3 mL Soaj injection Generic drug:  EPINEPHrine   fluticasone 50 MCG/ACT nasal spray Commonly known as:  FLONASE Place 1 spray into both nostrils 2 (two) times daily.   gabapentin 600 MG tablet Commonly known as:  NEURONTIN Take 600 mg by mouth 3 (three) times daily.   hydrOXYzine 25 MG tablet Commonly known as:  ATARAX/VISTARIL Take 25 mg by mouth every 8 (eight) hours as needed.   levocetirizine 5 MG tablet Commonly known as:  XYZAL Take 5 mg by mouth every evening.   levothyroxine 150 MCG tablet Commonly known as:  SYNTHROID, LEVOTHROID   PATADAY 0.2 % Soln Generic drug:  Olopatadine HCl   SUBOXONE 8-2 MG Film Generic drug:  Buprenorphine HCl-Naloxone HCl DOSSOLVE 1 FILM UNDER THE TONGUE QD-AM   traMADol 50 MG tablet Commonly known as:  ULTRAM Take 1 tablet (50 mg total) by  mouth every 6 (six) hours as needed.   traMADol 50 MG tablet Commonly known as:  ULTRAM Take 1 tablet (50 mg total) by mouth every 6 (six) hours as needed.   triamcinolone cream 0.1 % Commonly known as:  KENALOG Apply topically. Reported on 08/26/2015       Allergies:  Allergies  Allergen Reactions  . Geodon [Ziprasidone Hydrochloride] Other (See Comments)    Numbness ,sob, headaches, blurred vision  . Ziprasidone Hcl Anaphylaxis    Other reaction(s): Other (See Comments), Unknown Numbness ,sob, headaches, blurred vision Numbness ,sob, headaches, blurred vision  . Strawberry (Diagnostic)     rash  . Sulfa Antibiotics Hives  . Ace Inhibitors Rash  . Aripiprazole Rash  . Erythromycin Rash and Hives  . Hydromorphone Rash  . Lamictal [Lamotrigine]   . Penicillins Rash    Family History: Family History  Problem Relation Age of Onset  . Adopted: Yes    Social History:  reports that she has quit smoking. Her smoking use included Cigarettes. She has a 5.00 pack-year smoking history. She has quit using smokeless tobacco. She reports that she drinks alcohol. She reports that she does not use drugs.  ROS: UROLOGY Frequent Urination?: Yes Hard to postpone urination?: No Burning/pain with urination?: No Get up at night to urinate?: Yes Leakage of urine?: No Urine stream starts and stops?: Yes Trouble starting stream?: No Do you have to strain to urinate?: No Blood in urine?: No Urinary tract infection?: No Sexually transmitted disease?: No Injury to kidneys or bladder?: No Painful intercourse?: No Weak stream?: Yes Currently pregnant?: No Vaginal bleeding?: No Last menstrual period?: n  Gastrointestinal Nausea?: No Vomiting?: No Indigestion/heartburn?: No Diarrhea?: Yes Constipation?: No  Constitutional Fever: No Night sweats?: No Weight loss?: No Fatigue?: Yes  Skin Skin rash/lesions?: No Itching?: No  Eyes Blurred vision?: No Double vision?:  No  Ears/Nose/Throat Sore throat?: No Sinus problems?: No  Hematologic/Lymphatic Swollen glands?: No Easy bruising?: Yes  Cardiovascular Leg swelling?: Yes Chest pain?: No  Respiratory Cough?: No Shortness of breath?: No  Endocrine Excessive thirst?: No  Musculoskeletal Back pain?: Yes Joint pain?: Yes  Neurological Headaches?: Yes Dizziness?: No  Psychologic Depression?: Yes Anxiety?: Yes  Physical Exam: BP 98/63   Pulse 62   Ht 5\' 5"  (1.651 m)   Wt 216 lb 6.4 oz (98.2 kg)   BMI 36.01 kg/m     Laboratory Data: Lab Results  Component Value Date   WBC 8.5 04/03/2016   HGB 11.3 (L) 04/03/2016   HCT 33.8 (L) 04/03/2016   MCV 82.4  04/03/2016   PLT 282 04/03/2016    Lab Results  Component Value Date   CREATININE 0.70 04/03/2016    No results found for: PSA  No results found for: TESTOSTERONE  No results found for: HGBA1C  Urinalysis    Component Value Date/Time   COLORURINE YELLOW (A) 08/13/2015 1455   APPEARANCEUR Cloudy (A) 08/26/2015 1109   LABSPEC 1.018 08/13/2015 1455   PHURINE 7.0 08/13/2015 1455   GLUCOSEU Negative 08/26/2015 1109   HGBUR NEGATIVE 08/13/2015 1455   BILIRUBINUR Negative 08/26/2015 1109   KETONESUR NEGATIVE 08/13/2015 1455   PROTEINUR 1+ (A) 08/26/2015 1109   PROTEINUR NEGATIVE 08/13/2015 1455   UROBILINOGEN 1.0 06/18/2011 1258   NITRITE Negative 08/26/2015 1109   NITRITE NEGATIVE 08/13/2015 1455   LEUKOCYTESUR Negative 08/26/2015 1109    Pertinent Imaging: none  Assessment & Plan:  The patient has done very well since her sling. Physical activity discussed. Overall on very pleased for her especially because of her overactive bladder  I will see her on a when necessary basis  1. SUI (stress urinary incontinence, female) 2. Urgency incontinence 3. Urinary frequency  - Bladder Scan (Post Void Residual) in office    Reece Packer, MD  [facility] [address], [place], [place] [address] [phone]

## 2016-06-13 DIAGNOSIS — M79641 Pain in right hand: Secondary | ICD-10-CM | POA: Insufficient documentation

## 2016-06-13 DIAGNOSIS — R768 Other specified abnormal immunological findings in serum: Secondary | ICD-10-CM | POA: Insufficient documentation

## 2016-06-13 DIAGNOSIS — M79642 Pain in left hand: Secondary | ICD-10-CM

## 2016-06-15 ENCOUNTER — Other Ambulatory Visit: Payer: Self-pay | Admitting: Nurse Practitioner

## 2016-06-15 DIAGNOSIS — Z1231 Encounter for screening mammogram for malignant neoplasm of breast: Secondary | ICD-10-CM

## 2016-07-11 DIAGNOSIS — D509 Iron deficiency anemia, unspecified: Secondary | ICD-10-CM | POA: Insufficient documentation

## 2016-07-11 NOTE — Progress Notes (Signed)
[place]  [phone]) [phone] Fax:(336) [phone]  ID: Jacinta Shoe OB: 07/06/1971  MR#: [id number]  [id number]  Patient Care Team: Perrin Maltese, MD as PCP - General (Internal Medicine) Seeplaputhur Robinette Haines, MD as Consulting Physician (General Surgery)  CHIEF COMPLAINT: Iron deficiency anemia  INTERVAL HISTORY: Patient is a 45 year old female who was noted to have a adjuvantly declining hemoglobin over the past 12 months. She was also recently found to have a positive ANA and is being evaluated by rheumatology for possible underlying lupus. Currently, she is anxious but otherwise feels well. She has no neurologic complaints. She denies any recent fevers or illnesses. She has a good appetite and denies weight loss. She has no chest pain or shortness of breath. She denies any nausea, vomiting, constipation, or diarrhea. She has no melena or hematochezia. She has no urinary complaints. Patient feels at her baseline and offers no further specific complaints.  REVIEW OF SYSTEMS:   ROS  As per HPI. Otherwise, a complete review of systems is negative.  PAST MEDICAL HISTORY: Past Medical History:  Diagnosis Date  . Adopted   . Anemia   . Anxiety   . Arthritis   . Asthma    WELL CONTROLLED-NO INHALER  . Bipolar 1 disorder ([place])   . CHF (congestive heart failure) ([place])   . Chronic kidney disease    H/O KIDNEY STONES  . Depression   . Diabetes mellitus without complication ([place])    BORDERLINE diet control  . DVT (deep venous thrombosis) ([place]) 2016   RIGHT LEG  . Edema leg   . Effusion of knee 12/30/2013  . Family history of adverse reaction to anesthesia    ADOPTED  . GERD (gastroesophageal reflux disease)   . Headache(784.0)    MIGRAINES  . Heart burn   . Heart murmur   . History of kidney stones   . HLD (hyperlipidemia)   . Hx MRSA infection   . Hypothyroidism   . Lower extremity edema   . Post traumatic stress disorder (PTSD)   . PVD  (peripheral vascular disease) ([place])   . Seasonal allergies   . Stress incontinence   . Thyroid disease   . Urge incontinence     PAST SURGICAL HISTORY: Past Surgical History:  Procedure Laterality Date  . ABDOMINAL HYSTERECTOMY    . ANKLE SURGERY    . CHOLECYSTECTOMY    . CYSTOSCOPY N/A 04/09/2016   Procedure: CYSTOSCOPY;  Surgeon: Bjorn Loser, MD;  Location: [facility] ORS;  Service: Urology;  Laterality: N/A;  . JOINT REPLACEMENT    . KNEE ARTHROSCOPY    . KNEE ARTHROSCOPY WITH LATERAL RELEASE Left 10/18/2015   Procedure: KNEE ARTHROSCOPY LATERAL AND PARTIAL SYNOVECTOMY;  Surgeon: Hessie Knows, MD;  Location: [facility] ORS;  Service: Orthopedics;  Laterality: Left;  . Lymph Node removal  2015   Neck  . PUBOVAGINAL SLING N/A 04/09/2016   Procedure: PUBO-VAGINAL SLING/ RETROPUBIC SLING;  Surgeon: Bjorn Loser, MD;  Location: [facility] ORS;  Service: Urology;  Laterality: N/A;  . REPLACEMENT TOTAL KNEE Right   . SHOULDER SURGERY Right 2014  . TUBAL LIGATION    . WRIST SURGERY      FAMILY HISTORY: Family History  Problem Relation Age of Onset  . Adopted: Yes    ADVANCED DIRECTIVES (Y/N):  N  HEALTH MAINTENANCE: Social History  Substance Use Topics  . Smoking status: Former Smoker    Packs/day: 0.25    Years: 20.00    Types: Cigarettes  .  Smokeless tobacco: Never Used     Comment: STRESS RELATED  . Alcohol use 0.0 oz/week     Comment: occasional     Colonoscopy:  PAP:  Bone density:  Lipid panel:  Allergies  Allergen Reactions  . Geodon [Ziprasidone Hydrochloride] Other (See Comments)    Numbness ,sob, headaches, blurred vision  . Ziprasidone Hcl Anaphylaxis    Other reaction(s): Other (See Comments), Unknown Numbness ,sob, headaches, blurred vision Numbness ,sob, headaches, blurred vision  . Strawberry (Diagnostic)     rash  . Sulfa Antibiotics Hives  . Ace Inhibitors Rash  . Aripiprazole Rash  . Erythromycin Rash and Hives  . Hydromorphone Rash  . Lamictal  [Lamotrigine]   . Penicillins Rash    Current Outpatient Prescriptions  Medication Sig Dispense Refill  . ARIPiprazole (ABILIFY IM) Inject 400 Doses into the muscle every 30 (thirty) days.    Marland Kitchen atorvastatin (LIPITOR) 40 MG tablet Take 40 mg by mouth daily at 6 PM.    . colestipol (COLESTID) 1 g tablet Take 2 g by mouth daily.    . DULoxetine (CYMBALTA) 30 MG capsule Take 30 mg by mouth daily.    Marland Kitchen gabapentin (NEURONTIN) 600 MG tablet Take 600 mg by mouth 3 (three) times daily.    . hydrOXYzine (VISTARIL) 100 MG capsule Take 100 mg by mouth 3 (three) times daily as needed for itching.    . hyoscyamine (LEVSIN, ANASPAZ) 0.125 MG tablet Take 0.125 mg by mouth every 4 (four) hours as needed.    Marland Kitchen levothyroxine (SYNTHROID, LEVOTHROID) 150 MCG tablet Take 150 mcg by mouth daily before breakfast.     . meloxicam (MOBIC) 7.5 MG tablet Take 7.5 mg by mouth daily.    . methocarbamol (ROBAXIN) 500 MG tablet Take 500 mg by mouth every 8 (eight) hours.    . prazosin (MINIPRESS) 2 MG capsule Take 2 mg by mouth at bedtime.     No current facility-administered medications for this visit.     OBJECTIVE: Vitals:   07/13/16 1015  BP: 96/64  Pulse: (!) 54  Resp: 18  Temp: 97.2 F (36.2 C)     Body mass index is 35.2 kg/m.    ECOG FS:0 - Asymptomatic  General: Well-developed, well-nourished, no acute distress. Eyes: Pink conjunctiva, anicteric sclera. HEENT: Normocephalic, moist mucous membranes, clear oropharnyx. Lungs: Clear to auscultation bilaterally. Heart: Regular rate and rhythm. No rubs, murmurs, or gallops. Abdomen: Soft, nontender, nondistended. No organomegaly noted, normoactive bowel sounds. Musculoskeletal: No edema, cyanosis, or clubbing. Neuro: Alert, answering all questions appropriately. Cranial nerves grossly intact. Skin: No rashes or petechiae noted. Psych: Normal affect. Lymphatics: No cervical, calvicular, axillary or inguinal LAD.   LAB RESULTS:  Lab Results    Component Value Date   NA 139 04/03/2016   K 3.6 04/03/2016   CL 106 04/03/2016   CO2 26 04/03/2016   GLUCOSE 102 (H) 04/03/2016   BUN 11 04/03/2016   CREATININE 0.70 04/03/2016   CALCIUM 9.0 04/03/2016   PROT 7.8 08/13/2015   ALBUMIN 3.9 08/13/2015   AST 14 (L) 08/13/2015   ALT 13 (L) 08/13/2015   ALKPHOS 83 08/13/2015   BILITOT 0.4 08/13/2015   GFRNONAA >60 04/03/2016   GFRAA >60 04/03/2016    Lab Results  Component Value Date   WBC 5.7 07/13/2016   NEUTROABS 7.1 06/14/2011   HGB 11.5 (L) 07/13/2016   HCT 36.2 07/13/2016   MCV 83.8 07/13/2016   PLT 247 07/13/2016     STUDIES: No  results found.  ASSESSMENT: Iron deficiency anemia.  PLAN:    1. Iron deficiency anemia:  Patient's hemoglobin is only mildly decreased at 11.5 and relatively unchanged from 3 months ago. The remainder of her laboratory work including iron stores is pending at time of dictation. No intervention is needed at this time.  Her mild anemia possibly may be related to her lupus. Patient will return to clinic in mid February after her next rheumatology appointment for further evaluation and discussion of her laboratory work. 2. Possible lupus: Continued evaluation and treatment per rheumatology. Patient reports she has follow-up appointment with Dr. Meda Coffee the first week of February.  Patient expressed understanding and was in agreement with this plan. She also understands that She can call clinic at any time with any questions, concerns, or complaints.    Lloyd Huger, MD   07/13/2016 12:04 PM

## 2016-07-13 ENCOUNTER — Other Ambulatory Visit: Payer: Self-pay | Admitting: *Deleted

## 2016-07-13 ENCOUNTER — Inpatient Hospital Stay: Payer: Medicare Other

## 2016-07-13 ENCOUNTER — Encounter: Payer: Self-pay | Admitting: Oncology

## 2016-07-13 ENCOUNTER — Inpatient Hospital Stay: Payer: Medicare Other | Attending: Oncology | Admitting: Oncology

## 2016-07-13 DIAGNOSIS — E039 Hypothyroidism, unspecified: Secondary | ICD-10-CM | POA: Diagnosis not present

## 2016-07-13 DIAGNOSIS — F329 Major depressive disorder, single episode, unspecified: Secondary | ICD-10-CM | POA: Diagnosis not present

## 2016-07-13 DIAGNOSIS — R7989 Other specified abnormal findings of blood chemistry: Secondary | ICD-10-CM

## 2016-07-13 DIAGNOSIS — Z87442 Personal history of urinary calculi: Secondary | ICD-10-CM | POA: Diagnosis not present

## 2016-07-13 DIAGNOSIS — K219 Gastro-esophageal reflux disease without esophagitis: Secondary | ICD-10-CM | POA: Diagnosis not present

## 2016-07-13 DIAGNOSIS — M129 Arthropathy, unspecified: Secondary | ICD-10-CM | POA: Diagnosis not present

## 2016-07-13 DIAGNOSIS — E785 Hyperlipidemia, unspecified: Secondary | ICD-10-CM

## 2016-07-13 DIAGNOSIS — F4310 Post-traumatic stress disorder, unspecified: Secondary | ICD-10-CM

## 2016-07-13 DIAGNOSIS — Z8614 Personal history of Methicillin resistant Staphylococcus aureus infection: Secondary | ICD-10-CM | POA: Diagnosis not present

## 2016-07-13 DIAGNOSIS — I509 Heart failure, unspecified: Secondary | ICD-10-CM

## 2016-07-13 DIAGNOSIS — Z79899 Other long term (current) drug therapy: Secondary | ICD-10-CM | POA: Diagnosis not present

## 2016-07-13 DIAGNOSIS — F419 Anxiety disorder, unspecified: Secondary | ICD-10-CM | POA: Diagnosis not present

## 2016-07-13 DIAGNOSIS — Z87891 Personal history of nicotine dependence: Secondary | ICD-10-CM | POA: Diagnosis not present

## 2016-07-13 DIAGNOSIS — F319 Bipolar disorder, unspecified: Secondary | ICD-10-CM | POA: Diagnosis not present

## 2016-07-13 DIAGNOSIS — N393 Stress incontinence (female) (male): Secondary | ICD-10-CM

## 2016-07-13 DIAGNOSIS — M25469 Effusion, unspecified knee: Secondary | ICD-10-CM

## 2016-07-13 DIAGNOSIS — Z86718 Personal history of other venous thrombosis and embolism: Secondary | ICD-10-CM

## 2016-07-13 DIAGNOSIS — D509 Iron deficiency anemia, unspecified: Secondary | ICD-10-CM

## 2016-07-13 DIAGNOSIS — R609 Edema, unspecified: Secondary | ICD-10-CM | POA: Diagnosis not present

## 2016-07-13 DIAGNOSIS — R011 Cardiac murmur, unspecified: Secondary | ICD-10-CM | POA: Diagnosis not present

## 2016-07-13 DIAGNOSIS — Z8669 Personal history of other diseases of the nervous system and sense organs: Secondary | ICD-10-CM

## 2016-07-13 DIAGNOSIS — Z9049 Acquired absence of other specified parts of digestive tract: Secondary | ICD-10-CM | POA: Diagnosis not present

## 2016-07-13 DIAGNOSIS — I739 Peripheral vascular disease, unspecified: Secondary | ICD-10-CM | POA: Diagnosis not present

## 2016-07-13 DIAGNOSIS — J45909 Unspecified asthma, uncomplicated: Secondary | ICD-10-CM

## 2016-07-13 LAB — CBC
HEMATOCRIT: 36.2 % (ref 35.0–47.0)
Hemoglobin: 11.5 g/dL — ABNORMAL LOW (ref 12.0–16.0)
MCH: 26.7 pg (ref 26.0–34.0)
MCHC: 31.9 g/dL — ABNORMAL LOW (ref 32.0–36.0)
MCV: 83.8 fL (ref 80.0–100.0)
Platelets: 247 10*3/uL (ref 150–440)
RBC: 4.31 MIL/uL (ref 3.80–5.20)
RDW: 15 % — AB (ref 11.5–14.5)
WBC: 5.7 10*3/uL (ref 3.6–11.0)

## 2016-07-13 LAB — IRON AND TIBC
IRON: 39 ug/dL (ref 28–170)
Saturation Ratios: 10 % — ABNORMAL LOW (ref 10.4–31.8)
TIBC: 408 ug/dL (ref 250–450)
UIBC: 369 ug/dL

## 2016-07-13 LAB — DAT, POLYSPECIFIC AHG (ARMC ONLY): Polyspecific AHG test: NEGATIVE

## 2016-07-13 LAB — FOLATE: Folate: 11.2 ng/mL (ref >5.9)

## 2016-07-13 LAB — VITAMIN B12: VITAMIN B 12: 116 pg/mL — AB (ref 180–914)

## 2016-07-13 LAB — FERRITIN: FERRITIN: 11 ng/mL (ref 11–307)

## 2016-07-13 NOTE — Progress Notes (Signed)
New evaluation for anemia. Complains of mild ear pain.

## 2016-07-14 ENCOUNTER — Other Ambulatory Visit: Payer: Self-pay | Admitting: Oncology

## 2016-07-14 LAB — HAPTOGLOBIN: Haptoglobin: 133 mg/dL (ref 34–200)

## 2016-07-14 LAB — ERYTHROPOIETIN: ERYTHROPOIETIN: 10.5 m[IU]/mL (ref 2.6–18.5)

## 2016-07-23 ENCOUNTER — Ambulatory Visit
Admission: RE | Admit: 2016-07-23 | Discharge: 2016-07-23 | Disposition: A | Discharge status: Home / Self Care | Payer: Medicare Other | Source: Ambulatory Visit | Attending: Nurse Practitioner | Admitting: Nurse Practitioner

## 2016-07-23 DIAGNOSIS — Z1231 Encounter for screening mammogram for malignant neoplasm of breast: Secondary | ICD-10-CM | POA: Diagnosis present

## 2016-08-21 ENCOUNTER — Other Ambulatory Visit: Payer: Self-pay | Admitting: Otolaryngology

## 2016-08-21 DIAGNOSIS — E041 Nontoxic single thyroid nodule: Secondary | ICD-10-CM

## 2016-08-22 DIAGNOSIS — H5789 Other specified disorders of eye and adnexa: Secondary | ICD-10-CM

## 2016-08-22 HISTORY — DX: Other specified disorders of eye and adnexa: H57.89

## 2016-08-29 ENCOUNTER — Ambulatory Visit (INDEPENDENT_AMBULATORY_CARE_PROVIDER_SITE_OTHER): Payer: Medicare Other | Admitting: Urology

## 2016-08-29 VITALS — BP 90/58 | HR 58 | Ht 67.0 in

## 2016-08-29 DIAGNOSIS — N3946 Mixed incontinence: Secondary | ICD-10-CM | POA: Diagnosis not present

## 2016-08-29 DIAGNOSIS — N39498 Other specified urinary incontinence: Secondary | ICD-10-CM

## 2016-08-29 LAB — MICROSCOPIC EXAMINATION: Epithelial Cells (non renal): >10 /hpf — AB (ref 0–10)

## 2016-08-29 LAB — URINALYSIS, COMPLETE
Bilirubin, UA: NEGATIVE
Glucose, UA: NEGATIVE
Ketones, UA: NEGATIVE
Nitrite, UA: NEGATIVE
RBC, UA: NEGATIVE
Specific Gravity, UA: >1.03 — ABNORMAL HIGH (ref 1.005–1.030)
Urobilinogen, Ur: 0.2 mg/dL (ref 0.2–1.0)
pH, UA: 5 (ref 5.0–7.5)

## 2016-08-29 NOTE — Progress Notes (Signed)
[place]  [phone] Fax:(336) [phone]  ID: Alexis Christensen OB: 01-07-1971  MR#: [id number]  [id number]  Patient Care Team: Perrin Maltese, MD as PCP - General (Internal Medicine) Seeplaputhur Robinette Haines, MD as Consulting Physician (General Surgery)  CHIEF COMPLAINT: Iron deficiency anemia  INTERVAL HISTORY: Patient returns to clinic today for further evaluation and laboratory work. She continues to have joint pain as well as fatigue, but otherwise feels well.  She has no neurologic complaints. She denies any recent fevers or illnesses. She has a good appetite and denies weight loss. She has no chest pain or shortness of breath. She denies any nausea, vomiting, constipation, or diarrhea. She has no melena or hematochezia. She has no urinary complaints. Patient offers no further specific complaints today.  REVIEW OF SYSTEMS:   Review of Systems  Constitutional: Positive for malaise/fatigue. Negative for fever and weight loss.  Respiratory: Negative.  Negative for cough and shortness of breath.   Cardiovascular: Negative.  Negative for chest pain and leg swelling.  Gastrointestinal: Negative.   Genitourinary: Negative.   Musculoskeletal: Positive for joint pain.  Neurological: Negative.  Negative for focal weakness and weakness.  Psychiatric/Behavioral: The patient is nervous/anxious.     As per HPI. Otherwise, a complete review of systems is negative.  PAST MEDICAL HISTORY: Past Medical History:  Diagnosis Date  . Adopted   . Anemia   . Anxiety   . Arthritis   . Bipolar 1 disorder ([place])   . CHF (congestive heart failure) ([place])   . Chronic kidney disease    H/O KIDNEY STONES  . Depression   . DVT (deep venous thrombosis) ([place]) 2016   RIGHT LEG  . Edema leg   . Effusion of knee 12/30/2013  . Family history of adverse reaction to anesthesia    ADOPTED  . GERD (gastroesophageal reflux disease)   . Headache(784.0)    MIGRAINES  . Heart  burn   . Heart murmur   . History of kidney stones   . HLD (hyperlipidemia)   . Hx MRSA infection   . Hypothyroidism   . Lower extremity edema   . Post traumatic stress disorder (PTSD)   . PVD (peripheral vascular disease) ([place])   . Seasonal allergies   . Stress incontinence   . Thyroid disease   . Urge incontinence     PAST SURGICAL HISTORY: Past Surgical History:  Procedure Laterality Date  . ABDOMINAL HYSTERECTOMY    . ANKLE SURGERY    . CHOLECYSTECTOMY    . CYSTOSCOPY N/A 04/09/2016   Procedure: CYSTOSCOPY;  Surgeon: Bjorn Loser, MD;  Location: [facility] OR[facility];  Service: Urology;  Laterality: N/A;  . JOINT REPLACEMENT    . KNEE ARTHROSCOPY    . KNEE ARTHROSCOPY WITH LATERAL RELEASE Left 10/18/2015   Procedure: KNEE ARTHROSCOPY LATERAL AND PARTIAL SYNOVECTOMY;  Surgeon: Hessie Knows, MD;  Location: [facility] OR[facility];  Service: Orthopedics;  Laterality: Left;  . Lymph Node removal  2015   Neck  . PUBOVAGINAL SLING N/A 04/09/2016   Procedure: PUBO-VAGINAL SLING/ RETROPUBIC SLING;  Surgeon: Bjorn Loser, MD;  Location: [facility] OR[facility];  Service: Urology;  Laterality: N/A;  . REPLACEMENT TOTAL KNEE Right   . SHOULDER SURGERY Right 2014  . TUBAL LIGATION    . WRIST SURGERY      FAMILY HISTORY: Family History  Problem Relation Age of Onset  . Adopted: Yes    ADVANCED DIRECTIVES (Y/N):  N  HEALTH MAINTENANCE: Social History  Substance Use Topics  .  Smoking status: Former Smoker    Packs/day: 0.25    Years: 20.00    Types: Cigarettes  . Smokeless tobacco: Never Used     Comment: STRESS RELATED  . Alcohol use 0.0 oz/week     Comment: occasional     Colonoscopy:  PAP:  Bone density:  Lipid panel:  Allergies  Allergen Reactions  . Geodon [Ziprasidone Hydrochloride] Other (See Comments)    Numbness ,sob, headaches, blurred vision  . Ziprasidone Hcl Anaphylaxis    Other reaction(s): Other (See Comments), Unknown Numbness ,sob, headaches, blurred vision Numbness ,sob,  headaches, blurred vision  . Strawberry (Diagnostic)     rash  . Sulfa Antibiotics Hives  . Ace Inhibitors Rash  . Aripiprazole Rash  . Erythromycin Rash and Hives  . Hydromorphone Rash  . Lamictal [Lamotrigine]   . Penicillins Rash    Current Outpatient Prescriptions  Medication Sig Dispense Refill  . ARIPiprazole (ABILIFY IM) Inject 400 Doses into the muscle every 30 (thirty) days.    Marland Kitchen atorvastatin (LIPITOR) 40 MG tablet Take 40 mg by mouth daily at 6 PM.    . colestipol (COLESTID) 1 g tablet Take 2 g by mouth daily.    . DULoxetine (CYMBALTA) 30 MG capsule Take 30 mg by mouth daily.    Marland Kitchen gabapentin (NEURONTIN) 600 MG tablet Take 600 mg by mouth 3 (three) times daily.    . hydrOXYzine (VISTARIL) 100 MG capsule Take 100 mg by mouth 3 (three) times daily as needed for itching.    . hyoscyamine (LEVSIN, ANASPAZ) 0.125 MG tablet Take 0.125 mg by mouth every 4 (four) hours as needed.    Marland Kitchen levothyroxine (SYNTHROID, LEVOTHROID) 150 MCG tablet Take 150 mcg by mouth daily before breakfast.     . meloxicam (MOBIC) 7.5 MG tablet Take 7.5 mg by mouth daily.    . methocarbamol (ROBAXIN) 500 MG tablet Take 500 mg by mouth every 8 (eight) hours.    . prazosin (MINIPRESS) 2 MG capsule Take 2 mg by mouth at bedtime.     No current facility-administered medications for this visit.     OBJECTIVE: Vitals:   08/31/16 0925  BP: 93/65  Pulse: 64  Temp: 97.8 F (36.6 C)     Body mass index is 32.32 kg/m.    ECOG FS:0 - Asymptomatic  General: Well-developed, well-nourished, no acute distress. Eyes: Pink conjunctiva, anicteric sclera. Lungs: Clear to auscultation bilaterally. Heart: Regular rate and rhythm. No rubs, murmurs, or gallops. Abdomen: Soft, nontender, nondistended. No organomegaly noted, normoactive bowel sounds. Musculoskeletal: No edema, cyanosis, or clubbing. Neuro: Alert, answering all questions appropriately. Cranial nerves grossly intact. Skin: No rashes or petechiae  noted. Psych: Normal affect.   LAB RESULTS:  Lab Results  Component Value Date   NA 139 04/03/2016   K 3.6 04/03/2016   CL 106 04/03/2016   CO2 26 04/03/2016   GLUCOSE 102 (H) 04/03/2016   BUN 11 04/03/2016   CREATININE 0.70 04/03/2016   CALCIUM 9.0 04/03/2016   PROT 7.8 08/13/2015   ALBUMIN 3.9 08/13/2015   AST 14 (L) 08/13/2015   ALT 13 (L) 08/13/2015   ALKPHOS 83 08/13/2015   BILITOT 0.4 08/13/2015   GFRNONAA >60 04/03/2016   GFRAA >60 04/03/2016    Lab Results  Component Value Date   WBC 7.4 08/31/2016   NEUTROABS 4.6 08/31/2016   HGB 11.0 (L) 08/31/2016   HCT 33.7 (L) 08/31/2016   MCV 83.1 08/31/2016   PLT 342 08/31/2016   Lab Results  Component Value Date   IRON 39 07/13/2016   TIBC 408 07/13/2016   IRONPCTSAT 10 (L) 07/13/2016   Lab Results  Component Value Date   FERRITIN 11 07/13/2016     STUDIES: Ct Orbits W Contrast  Result Date: 08/31/2016 CLINICAL DATA:  Swelling on the left side.  Started this morning. EXAM: CT ORBITS WITH CONTRAST TECHNIQUE: Multidetector CT images was performed according to the standard protocol following intravenous contrast administration. CONTRAST:  52mL ISOVUE-300 IOPAMIDOL (ISOVUE-300) INJECTION 61% COMPARISON:  None. FINDINGS: Orbits: Left preseptal soft tissue swelling and enhancement. No right preseptal soft tissue abnormality. No postseptal abnormality. No intraconal abnormality. No traumatic findings. Globes, optic nerves, orbital fat, extraocular muscles, vascular structures, and lacrimal glands are normal. Visualized sinuses: Visualized paranasal sinuses are clear. Soft tissues: No other soft tissue abnormality. No fluid collection or hematoma. Limited intracranial: No significant or unexpected finding. IMPRESSION: 1. Mild left preseptal cellulitis. No drainable fluid collection to suggest an abscess. Electronically Signed   By: Kathreen Devoid   On: 08/31/2016 17:16    ASSESSMENT: Iron deficiency anemia.  PLAN:    1.  Iron deficiency anemia:  Patient's hemoglobin has trended down and she has decreased iron stores. Previously, the remainder of her laboratory work was either negative or within normal limits. We will proceed with one infusion of 510 mg IV Feraheme today. Return to clinic in 3 months with repeat laboratory work and further evaluation.  2. Possible lupus: Continued evaluation and treatment per rheumatology. Patient is currently on a prednisone taper and has follow-up with rheumatology in the next 1-2 weeks.  Patient expressed understanding and was in agreement with this plan. She also understands that She can call clinic at any time with any questions, concerns, or complaints.    Lloyd Huger, MD   09/02/2016 8:31 AM

## 2016-08-29 NOTE — Progress Notes (Signed)
08/29/2016 11:24 AM   Alexis Christensen 07-21-1970 [id number]  Referring provider: Perrin Maltese, MD [address] [place], [place] [address]  Chief Complaint  Patient presents with  . Follow-up    stress urinary incontinence     HPI: The patient had a sling on September 25 or mixed incontinence. She had failed medication.  The patient no longer has stress incontinence. If she holds it too long she can have some urgency incontinence but she says the urgency is better. She is clinically not infected. Some days she will wear 1 pad just in case  On pelvic examination her incision is well-healed. There is no sling extrusion. She had no stress incontinence  Her residual today was 0 mL    Today Prior to surgery the patient had failed to antimuscarinics and a beta 3 agonist. We actually had talked about refractory overactive bladder treatments and she chose a sling. We were concerned about persistent or worsening overactive bladder post bladder suspension  The patient was continent until about 1 month ago. She did start Cymbalta I any precipitating factors otherwise. She says she no longer leaks with coughing and sneezing and activity but has significant urge incontinence and can soak multiple pads a day as high as 12. She can void every one hour at night and does have ankle edema. She is voiding every 1 hour and cannot hold it for 2 hours.  She reports no blood in her urine or pain or bladder infection symptoms.  Modifying factors: There are no other modifying factors  Associated signs and symptoms: There are no other associated signs and symptoms Aggravating and relieving factors: There are no other aggravating or relieving factors  On review the medical records she was soaking 6 pads per day and voiding every 30-60 minutes and getting up 4 times at night with ankle edema  On pelvic examination the patient had appropriate hypermobility of the bladder neck and no stress incontinence  with a good cough. She had mucosal folds but I cannot feel or see any sling extrusion Severity: Moderate Duration: Persistent   PMH: Past Medical History:  Diagnosis Date  . Adopted   . Anemia   . Anxiety   . Arthritis   . Asthma    WELL CONTROLLED-NO INHALER  . Bipolar 1 disorder ([place])   . CHF (congestive heart failure) ([place])   . Chronic kidney disease    H/O KIDNEY STONES  . Depression   . Diabetes mellitus without complication ([place])    BORDERLINE diet control  . DVT (deep venous thrombosis) ([place]) 2016   RIGHT LEG  . Edema leg   . Effusion of knee 12/30/2013  . Family history of adverse reaction to anesthesia    ADOPTED  . GERD (gastroesophageal reflux disease)   . Headache(784.0)    MIGRAINES  . Heart burn   . Heart murmur   . History of kidney stones   . HLD (hyperlipidemia)   . Hx MRSA infection   . Hypothyroidism   . Lower extremity edema   . Post traumatic stress disorder (PTSD)   . PVD (peripheral vascular disease) ([place])   . Seasonal allergies   . Stress incontinence   . Thyroid disease   . Urge incontinence     Surgical History: Past Surgical History:  Procedure Laterality Date  . ABDOMINAL HYSTERECTOMY    . ANKLE SURGERY    . CHOLECYSTECTOMY    . CYSTOSCOPY N/A 04/09/2016   Procedure: CYSTOSCOPY;  Surgeon:  Bjorn Loser, MD;  Location: [facility];  Service: Urology;  Laterality: N/A;  . JOINT REPLACEMENT    . KNEE ARTHROSCOPY    . KNEE ARTHROSCOPY WITH LATERAL RELEASE Left 10/18/2015   Procedure: KNEE ARTHROSCOPY LATERAL AND PARTIAL SYNOVECTOMY;  Surgeon: Hessie Knows, MD;  Location: [facility];  Service: Orthopedics;  Laterality: Left;  . Lymph Node removal  2015   Neck  . PUBOVAGINAL SLING N/A 04/09/2016   Procedure: PUBO-VAGINAL SLING/ RETROPUBIC SLING;  Surgeon: Bjorn Loser, MD;  Location: [facility];  Service: Urology;  Laterality: N/A;  . REPLACEMENT TOTAL KNEE Right   . SHOULDER SURGERY Right 2014  . TUBAL LIGATION    . WRIST SURGERY       Home Medications:  Allergies as of 08/29/2016      Reactions   Geodon [ziprasidone Hydrochloride] Other (See Comments)   Numbness ,sob, headaches, blurred vision   Ziprasidone Hcl Anaphylaxis   Other reaction(s): Other (See Comments), Unknown Numbness ,sob, headaches, blurred vision Numbness ,sob, headaches, blurred vision   Strawberry (diagnostic)    rash   Sulfa Antibiotics Hives   Ace Inhibitors Rash   Aripiprazole Rash   Erythromycin Rash, Hives   Hydromorphone Rash   Lamictal [lamotrigine]    Penicillins Rash      Medication List       Accurate as of 08/29/16 11:24 AM. Always use your most recent med list.          ABILIFY IM Inject 400 Doses into the muscle every 30 (thirty) days.   atorvastatin 40 MG tablet Commonly known as:  LIPITOR Take 40 mg by mouth daily at 6 PM.   colestipol 1 g tablet Commonly known as:  COLESTID Take 2 g by mouth daily.   DULoxetine 30 MG capsule Commonly known as:  CYMBALTA Take 30 mg by mouth daily.   gabapentin 600 MG tablet Commonly known as:  NEURONTIN Take 600 mg by mouth 3 (three) times daily.   hydrOXYzine 100 MG capsule Commonly known as:  VISTARIL Take 100 mg by mouth 3 (three) times daily as needed for itching.   hyoscyamine 0.125 MG tablet Commonly known as:  LEVSIN, ANASPAZ Take 0.125 mg by mouth every 4 (four) hours as needed.   levothyroxine 150 MCG tablet Commonly known as:  SYNTHROID, LEVOTHROID Take 150 mcg by mouth daily before breakfast.   meloxicam 7.5 MG tablet Commonly known as:  MOBIC Take 7.5 mg by mouth daily.   methocarbamol 500 MG tablet Commonly known as:  ROBAXIN Take 500 mg by mouth every 8 (eight) hours.   prazosin 2 MG capsule Commonly known as:  MINIPRESS Take 2 mg by mouth at bedtime.       Allergies:  Allergies  Allergen Reactions  . Geodon [Ziprasidone Hydrochloride] Other (See Comments)    Numbness ,sob, headaches, blurred vision  . Ziprasidone Hcl Anaphylaxis     Other reaction(s): Other (See Comments), Unknown Numbness ,sob, headaches, blurred vision Numbness ,sob, headaches, blurred vision  . Strawberry (Diagnostic)     rash  . Sulfa Antibiotics Hives  . Ace Inhibitors Rash  . Aripiprazole Rash  . Erythromycin Rash and Hives  . Hydromorphone Rash  . Lamictal [Lamotrigine]   . Penicillins Rash    Family History: Family History  Problem Relation Age of Onset  . Adopted: Yes    Social History:  reports that she has quit smoking. Her smoking use included Cigarettes. She has a 5.00 pack-year smoking history. She has never used smokeless  tobacco. She reports that she drinks alcohol. She reports that she does not use drugs.  ROS: UROLOGY Frequent Urination?: Yes Hard to postpone urination?: Yes Burning/pain with urination?: No Get up at night to urinate?: Yes Leakage of urine?: Yes Urine stream starts and stops?: No Trouble starting stream?: No Do you have to strain to urinate?: No Blood in urine?: No Urinary tract infection?: No Sexually transmitted disease?: No Injury to kidneys or bladder?: No Painful intercourse?: No Weak stream?: No Currently pregnant?: No Vaginal bleeding?: No Last menstrual period?: No  Gastrointestinal Nausea?: No Vomiting?: No Indigestion/heartburn?: No Diarrhea?: Yes Constipation?: No  Constitutional Fever: No Night sweats?: No Weight loss?: No Fatigue?: Yes  Skin Skin rash/lesions?: Yes Itching?: No  Eyes Blurred vision?: No Double vision?: No  Ears/Nose/Throat Sore throat?: No Sinus problems?: No  Hematologic/Lymphatic Swollen glands?: No Easy bruising?: Yes  Cardiovascular Leg swelling?: Yes Chest pain?: No  Respiratory Cough?: No Shortness of breath?: No  Endocrine Excessive thirst?: Yes  Musculoskeletal Back pain?: No Joint pain?: Yes  Neurological Headaches?: No Dizziness?: No  Psychologic Depression?: Yes Anxiety?: Yes  Physical Exam: BP (!) 90/58    Pulse (!) 58   Ht 5\' 7"  (1.702 m)     Laboratory Data: Lab Results  Component Value Date   WBC 5.7 07/13/2016   HGB 11.5 (L) 07/13/2016   HCT 36.2 07/13/2016   MCV 83.8 07/13/2016   PLT 247 07/13/2016    Lab Results  Component Value Date   CREATININE 0.70 04/03/2016    No results found for: PSA  No results found for: TESTOSTERONE  No results found for: HGBA1C  Urinalysis    Component Value Date/Time   COLORURINE YELLOW (A) 08/13/2015 1455   APPEARANCEUR Cloudy (A) 08/26/2015 1109   LABSPEC 1.018 08/13/2015 1455   PHURINE 7.0 08/13/2015 1455   GLUCOSEU Negative 08/26/2015 1109   HGBUR NEGATIVE 08/13/2015 1455   BILIRUBINUR Negative 08/26/2015 1109   KETONESUR NEGATIVE 08/13/2015 1455   PROTEINUR 1+ (A) 08/26/2015 1109   PROTEINUR NEGATIVE 08/13/2015 1455   UROBILINOGEN 1.0 06/18/2011 1258   NITRITE Negative 08/26/2015 1109   NITRITE NEGATIVE 08/13/2015 1455   LEUKOCYTESUR Negative 08/26/2015 1109    Pertinent Imaging: none  Assessment & Plan:   The patient clinically has recurrent persistent and ongoing refractory overactive bladder but no longer has stress incontinence. For safety reasons I recommend a cystoscopy to make certain she does not have any erosion. I did briefly mention the 3 refractory therapies again to her today  Patient agreed with the following plan. We will call her if the culture is positive. She will come back in about 2 weeks for a safety cystoscopy. If normal I will walk her through the 3 refractory therapies again. She remembers speaking about them but has not given them much thought, which when she might or might not choose    There are no diagnoses linked to this encounter.  No Follow-up on file.  Reece Packer, MD  [facility] [address], [place], [place] [address] [phone]

## 2016-08-31 ENCOUNTER — Other Ambulatory Visit: Payer: Self-pay | Admitting: Ophthalmology

## 2016-08-31 ENCOUNTER — Inpatient Hospital Stay: Payer: Medicare Other | Attending: Oncology

## 2016-08-31 ENCOUNTER — Inpatient Hospital Stay: Payer: Medicare Other

## 2016-08-31 ENCOUNTER — Ambulatory Visit: Admission: RE | Admit: 2016-08-31 | Payer: Medicare Other | Source: Ambulatory Visit

## 2016-08-31 ENCOUNTER — Other Ambulatory Visit: Payer: Self-pay | Admitting: *Deleted

## 2016-08-31 ENCOUNTER — Ambulatory Visit
Admission: RE | Admit: 2016-08-31 | Discharge: 2016-08-31 | Disposition: A | Discharge status: Home / Self Care | Payer: Medicare Other | Source: Ambulatory Visit | Attending: Ophthalmology | Admitting: Ophthalmology

## 2016-08-31 ENCOUNTER — Inpatient Hospital Stay (HOSPITAL_BASED_OUTPATIENT_CLINIC_OR_DEPARTMENT_OTHER): Payer: Medicare Other | Admitting: Oncology

## 2016-08-31 VITALS — BP 93/65 | HR 64 | Temp 97.8°F | Wt 206.4 lb

## 2016-08-31 VITALS — BP 102/70 | HR 51 | Resp 18

## 2016-08-31 DIAGNOSIS — M2550 Pain in unspecified joint: Secondary | ICD-10-CM | POA: Insufficient documentation

## 2016-08-31 DIAGNOSIS — Z86718 Personal history of other venous thrombosis and embolism: Secondary | ICD-10-CM | POA: Diagnosis not present

## 2016-08-31 DIAGNOSIS — R609 Edema, unspecified: Secondary | ICD-10-CM

## 2016-08-31 DIAGNOSIS — M129 Arthropathy, unspecified: Secondary | ICD-10-CM | POA: Insufficient documentation

## 2016-08-31 DIAGNOSIS — Z87442 Personal history of urinary calculi: Secondary | ICD-10-CM | POA: Diagnosis not present

## 2016-08-31 DIAGNOSIS — F329 Major depressive disorder, single episode, unspecified: Secondary | ICD-10-CM

## 2016-08-31 DIAGNOSIS — N3941 Urge incontinence: Secondary | ICD-10-CM

## 2016-08-31 DIAGNOSIS — M25469 Effusion, unspecified knee: Secondary | ICD-10-CM | POA: Insufficient documentation

## 2016-08-31 DIAGNOSIS — R531 Weakness: Secondary | ICD-10-CM | POA: Diagnosis not present

## 2016-08-31 DIAGNOSIS — D509 Iron deficiency anemia, unspecified: Secondary | ICD-10-CM | POA: Insufficient documentation

## 2016-08-31 DIAGNOSIS — F319 Bipolar disorder, unspecified: Secondary | ICD-10-CM | POA: Diagnosis not present

## 2016-08-31 DIAGNOSIS — R011 Cardiac murmur, unspecified: Secondary | ICD-10-CM | POA: Insufficient documentation

## 2016-08-31 DIAGNOSIS — I509 Heart failure, unspecified: Secondary | ICD-10-CM

## 2016-08-31 DIAGNOSIS — K219 Gastro-esophageal reflux disease without esophagitis: Secondary | ICD-10-CM

## 2016-08-31 DIAGNOSIS — Z87891 Personal history of nicotine dependence: Secondary | ICD-10-CM | POA: Insufficient documentation

## 2016-08-31 DIAGNOSIS — E039 Hypothyroidism, unspecified: Secondary | ICD-10-CM

## 2016-08-31 DIAGNOSIS — L03213 Periorbital cellulitis: Secondary | ICD-10-CM | POA: Diagnosis not present

## 2016-08-31 DIAGNOSIS — R5383 Other fatigue: Secondary | ICD-10-CM | POA: Insufficient documentation

## 2016-08-31 DIAGNOSIS — H0589 Other disorders of orbit: Secondary | ICD-10-CM

## 2016-08-31 DIAGNOSIS — E785 Hyperlipidemia, unspecified: Secondary | ICD-10-CM | POA: Insufficient documentation

## 2016-08-31 DIAGNOSIS — Z79899 Other long term (current) drug therapy: Secondary | ICD-10-CM

## 2016-08-31 DIAGNOSIS — E079 Disorder of thyroid, unspecified: Secondary | ICD-10-CM | POA: Diagnosis not present

## 2016-08-31 DIAGNOSIS — F4310 Post-traumatic stress disorder, unspecified: Secondary | ICD-10-CM | POA: Insufficient documentation

## 2016-08-31 DIAGNOSIS — Z8669 Personal history of other diseases of the nervous system and sense organs: Secondary | ICD-10-CM | POA: Diagnosis not present

## 2016-08-31 DIAGNOSIS — N393 Stress incontinence (female) (male): Secondary | ICD-10-CM

## 2016-08-31 DIAGNOSIS — I739 Peripheral vascular disease, unspecified: Secondary | ICD-10-CM | POA: Diagnosis not present

## 2016-08-31 DIAGNOSIS — F419 Anxiety disorder, unspecified: Secondary | ICD-10-CM | POA: Diagnosis not present

## 2016-08-31 DIAGNOSIS — Z8614 Personal history of Methicillin resistant Staphylococcus aureus infection: Secondary | ICD-10-CM | POA: Insufficient documentation

## 2016-08-31 DIAGNOSIS — H05229 Edema of unspecified orbit: Secondary | ICD-10-CM

## 2016-08-31 LAB — CBC WITH DIFFERENTIAL/PLATELET
Basophils Absolute: 0.1 10*3/uL (ref 0–0.1)
Basophils Relative: 1 %
EOS PCT: 5 %
Eosinophils Absolute: 0.3 10*3/uL (ref 0–0.7)
HCT: 33.7 % — ABNORMAL LOW (ref 35.0–47.0)
HEMOGLOBIN: 11 g/dL — AB (ref 12.0–16.0)
LYMPHS ABS: 1.7 10*3/uL (ref 1.0–3.6)
LYMPHS PCT: 23 %
MCH: 27.2 pg (ref 26.0–34.0)
MCHC: 32.7 g/dL (ref 32.0–36.0)
MCV: 83.1 fL (ref 80.0–100.0)
MONOS PCT: 9 %
Monocytes Absolute: 0.7 10*3/uL (ref 0.2–0.9)
NEUTROS PCT: 62 %
Neutro Abs: 4.6 10*3/uL (ref 1.4–6.5)
Platelets: 342 10*3/uL (ref 150–440)
RBC: 4.05 MIL/uL (ref 3.80–5.20)
RDW: 16.6 % — ABNORMAL HIGH (ref 11.5–14.5)
WBC: 7.4 10*3/uL (ref 3.6–11.0)

## 2016-08-31 MED ORDER — FERUMOXYTOL INJECTION 510 MG/17 ML
510.0000 mg | Freq: Once | INTRAVENOUS | Status: AC
  Administered 2016-08-31: 510 mg via INTRAVENOUS
  Filled 2016-08-31: qty 17

## 2016-08-31 MED ORDER — SODIUM CHLORIDE 0.9 % IV SOLN
Freq: Once | INTRAVENOUS | Status: AC
  Administered 2016-08-31: 10:00:00 via INTRAVENOUS
  Filled 2016-08-31: qty 1000

## 2016-08-31 MED ORDER — IOPAMIDOL (ISOVUE-300) INJECTION 61%
75.0000 mL | Freq: Once | INTRAVENOUS | Status: AC | PRN
  Administered 2016-08-31: 75 mL via INTRAVENOUS

## 2016-08-31 NOTE — Progress Notes (Signed)
Here for IDA. Possible Iron infusion. States she has fatigue all of the time. Denies blood in stools.

## 2016-09-01 LAB — CULTURE, URINE COMPREHENSIVE

## 2016-09-17 ENCOUNTER — Ambulatory Visit (INDEPENDENT_AMBULATORY_CARE_PROVIDER_SITE_OTHER): Payer: Medicare Other | Admitting: Urology

## 2016-09-17 VITALS — BP 111/75 | HR 56 | Ht 67.0 in | Wt 204.9 lb

## 2016-09-17 DIAGNOSIS — N3946 Mixed incontinence: Secondary | ICD-10-CM

## 2016-09-17 DIAGNOSIS — N393 Stress incontinence (female) (male): Secondary | ICD-10-CM

## 2016-09-17 LAB — MICROSCOPIC EXAMINATION
BACTERIA UA: NONE SEEN
Epithelial Cells (non renal): >10 /hpf — AB (ref 0–10)

## 2016-09-17 LAB — URINALYSIS, COMPLETE
Bilirubin, UA: NEGATIVE
GLUCOSE, UA: NEGATIVE
KETONES UA: NEGATIVE
LEUKOCYTES UA: NEGATIVE
Nitrite, UA: NEGATIVE
RBC, UA: NEGATIVE
SPEC GRAV UA: 1.025 (ref 1.005–1.030)
Urobilinogen, Ur: 0.2 mg/dL (ref 0.2–1.0)
pH, UA: 7 (ref 5.0–7.5)

## 2016-09-17 MED ORDER — CIPROFLOXACIN HCL 500 MG PO TABS
500.0000 mg | ORAL_TABLET | Freq: Once | ORAL | Status: AC
  Administered 2016-09-17: 500 mg via ORAL

## 2016-09-17 MED ORDER — LIDOCAINE HCL 2 % EX GEL
1.0000 "application " | Freq: Once | CUTANEOUS | Status: AC
  Administered 2016-09-17: 1 via URETHRAL

## 2016-09-17 NOTE — Progress Notes (Signed)
09/17/2016 10:24 AM   Alexis Christensen [birth year] [id number]  Referring provider: Perrin Maltese, MD [address] [place], [place] [address]  Chief Complaint  Patient presents with  . Cysto    SUI incontinence    HPI: The patient had a sling on September 25 or mixed incontinence. She had failed medication.  Today Prior to surgery the patient had failed 2 antimuscarinics and a beta 3 agonist. We actually had talked about refractory overactive bladder treatments and she chose a sling. We were concerned about persistent or worsening overactive bladder post bladder suspension  The patient was continent until about 1 month ago. She did start Cymbalta I any precipitating factors otherwise. She says she no longer leaks with coughing and sneezing and activity but has significant urge incontinence and can soak multiple pads a day as high as 12. She can void every one hour at night and does have ankle edema. She is voiding every 1 hour and cannot hold it for 2 hours.  She reports no blood in her urine or pain or bladder infection symptoms.  On review the medical records she was soaking 6 pads per day and voiding every 30-60 minutes and getting up 4 times at night with ankle edema prior to surgery  On pelvic examination the patient had appropriate hypermobility of the bladder neck and no stress incontinence with a good cough. She had mucosal folds but I cannot feel or see any sling extrusion  Today Last urine c/s was normal Clinically noninfected today and urgency incontinence is persisting The patient underwent cystoscopy  Following verbal and written consent sterile flexible cystoscopy was performed. The bladder mucosa and trigone were normal. There was no foreign body in the bladder. There was no cystitis. The urethra was normal with no foreign body. She tolerated the procedure very well     PMH: Past Medical History:  Diagnosis Date  . Adopted   . Anemia   . Anxiety   .  Arthritis   . Bipolar 1 disorder ([place])   . CHF (congestive heart failure) ([place])   . Chronic kidney disease    H/O KIDNEY STONES  . Depression   . DVT (deep venous thrombosis) ([place]) 2016   RIGHT LEG  . Edema leg   . Effusion of knee 12/30/2013  . Family history of adverse reaction to anesthesia    ADOPTED  . GERD (gastroesophageal reflux disease)   . Headache(784.0)    MIGRAINES  . Heart burn   . Heart murmur   . History of kidney stones   . HLD (hyperlipidemia)   . Hx MRSA infection   . Hypothyroidism   . Lower extremity edema   . Post traumatic stress disorder (PTSD)   . PVD (peripheral vascular disease) ([place])   . Seasonal allergies   . Stress incontinence   . Thyroid disease   . Urge incontinence     Surgical History: Past Surgical History:  Procedure Laterality Date  . ABDOMINAL HYSTERECTOMY    . ANKLE SURGERY    . CHOLECYSTECTOMY    . CYSTOSCOPY N/A 04/09/2016   Procedure: CYSTOSCOPY;  Surgeon: Bjorn Loser, MD;  Location: [facility];  Service: Urology;  Laterality: N/A;  . JOINT REPLACEMENT    . KNEE ARTHROSCOPY    . KNEE ARTHROSCOPY WITH LATERAL RELEASE Left 10/18/2015   Procedure: KNEE ARTHROSCOPY LATERAL AND PARTIAL SYNOVECTOMY;  Surgeon: Hessie Knows, MD;  Location: [facility];  Service: Orthopedics;  Laterality: Left;  . Lymph Node removal  2015   Neck  . PUBOVAGINAL SLING N/A 04/09/2016   Procedure: PUBO-VAGINAL SLING/ RETROPUBIC SLING;  Surgeon: Bjorn Loser, MD;  Location: [facility];  Service: Urology;  Laterality: N/A;  . REPLACEMENT TOTAL KNEE Right   . SHOULDER SURGERY Right 2014  . TUBAL LIGATION    . WRIST SURGERY      Home Medications:  Allergies as of 09/17/2016      Reactions   Geodon [ziprasidone Hydrochloride] Other (See Comments)   Numbness ,sob, headaches, blurred vision   Ziprasidone Hcl Anaphylaxis   Other reaction(s): Other (See Comments), Unknown Numbness ,sob, headaches, blurred vision Numbness ,sob, headaches, blurred vision    Strawberry (diagnostic)    rash   Sulfa Antibiotics Hives   Ace Inhibitors Rash   Aripiprazole Rash   Erythromycin Rash, Hives   Hydromorphone Rash   Lamictal [lamotrigine]    Penicillins Rash      Medication List       Accurate as of 09/17/16 10:24 AM. Always use your most recent med list.          ABILIFY IM Inject 400 Doses into the muscle every 30 (thirty) days.   atorvastatin 40 MG tablet Commonly known as:  LIPITOR Take 40 mg by mouth daily at 6 PM.   colestipol 1 g tablet Commonly known as:  COLESTID Take 2 g by mouth daily.   DULoxetine 30 MG capsule Commonly known as:  CYMBALTA Take 30 mg by mouth daily.   gabapentin 600 MG tablet Commonly known as:  NEURONTIN Take 600 mg by mouth 3 (three) times daily.   hydrOXYzine 100 MG capsule Commonly known as:  VISTARIL Take 100 mg by mouth 3 (three) times daily as needed for itching.   hyoscyamine 0.125 MG tablet Commonly known as:  LEVSIN, ANASPAZ Take 0.125 mg by mouth every 4 (four) hours as needed.   levothyroxine 150 MCG tablet Commonly known as:  SYNTHROID, LEVOTHROID Take 150 mcg by mouth daily before breakfast.   meloxicam 7.5 MG tablet Commonly known as:  MOBIC Take 7.5 mg by mouth daily.   methocarbamol 500 MG tablet Commonly known as:  ROBAXIN Take 500 mg by mouth every 8 (eight) hours.   prazosin 2 MG capsule Commonly known as:  MINIPRESS Take 2 mg by mouth at bedtime.       Allergies:  Allergies  Allergen Reactions  . Geodon [Ziprasidone Hydrochloride] Other (See Comments)    Numbness ,sob, headaches, blurred vision  . Ziprasidone Hcl Anaphylaxis    Other reaction(s): Other (See Comments), Unknown Numbness ,sob, headaches, blurred vision Numbness ,sob, headaches, blurred vision  . Strawberry (Diagnostic)     rash  . Sulfa Antibiotics Hives  . Ace Inhibitors Rash  . Aripiprazole Rash  . Erythromycin Rash and Hives  . Hydromorphone Rash  . Lamictal [Lamotrigine]   .  Penicillins Rash    Family History: Family History  Problem Relation Age of Onset  . Adopted: Yes    Social History:  reports that she has quit smoking. Her smoking use included Cigarettes. She has a 5.00 pack-year smoking history. She has never used smokeless tobacco. She reports that she drinks alcohol. She reports that she does not use drugs.  ROS:                                        Physical Exam: There were no vitals taken for  this visit.  Constitutional:  Alert and oriented, No acute distress.   Laboratory Data: Lab Results  Component Value Date   WBC 7.4 08/31/2016   HGB 11.0 (L) 08/31/2016   HCT 33.7 (L) 08/31/2016   MCV 83.1 08/31/2016   PLT 342 08/31/2016    Lab Results  Component Value Date   CREATININE 0.70 04/03/2016    No results found for: PSA  No results found for: TESTOSTERONE  No results found for: HGBA1C  Urinalysis    Component Value Date/Time   COLORURINE YELLOW (A) 08/13/2015 1455   APPEARANCEUR Cloudy (A) 08/29/2016 1107   LABSPEC 1.018 08/13/2015 1455   PHURINE 7.0 08/13/2015 1455   GLUCOSEU Negative 08/29/2016 1107   HGBUR NEGATIVE 08/13/2015 1455   BILIRUBINUR Negative 08/29/2016 1107   KETONESUR NEGATIVE 08/13/2015 1455   PROTEINUR 1+ (A) 08/29/2016 1107   PROTEINUR NEGATIVE 08/13/2015 1455   UROBILINOGEN 1.0 06/18/2011 1258   NITRITE Negative 08/29/2016 1107   NITRITE NEGATIVE 08/13/2015 1455   LEUKOCYTESUR Trace (A) 08/29/2016 1107    Pertinent Imaging: none  Assessment & Plan:  The patient and I spoke about refractory overactive bladder. I talked to her about the 3 refractory therapies with my usual template and this will be noted in my [place] record  The patient would like to have the first stage of InterStim and hopefully the implant if it works well. She would rather have something that freed her from frequent office visits even twice a year. We would do the implantation here in  [place] and I would double checked the operating room and they're familiarity with the procedure   1. SUI (stress urinary incontinence, female) 2. Urgency incontinence   - Urinalysis, Complete - ciprofloxacin (CIPRO) tablet 500 mg; Take 1 tablet (500 mg total) by mouth once. - lidocaine (XYLOCAINE) 2 % jelly 1 application; Place 1 application into the urethra once.   No Follow-up on file.  Reece Packer, MD  [facility] [address], [place], [place] [address] [phone]

## 2016-09-28 ENCOUNTER — Other Ambulatory Visit: Payer: Medicare Other

## 2016-09-28 ENCOUNTER — Ambulatory Visit: Payer: Medicare Other | Admitting: Oncology

## 2016-09-28 ENCOUNTER — Ambulatory Visit: Payer: Medicare Other

## 2016-10-22 ENCOUNTER — Encounter: Payer: Self-pay | Admitting: Urology

## 2016-10-22 ENCOUNTER — Telehealth: Payer: Self-pay | Admitting: Urology

## 2016-10-22 ENCOUNTER — Ambulatory Visit (INDEPENDENT_AMBULATORY_CARE_PROVIDER_SITE_OTHER): Payer: Medicare Other | Admitting: Urology

## 2016-10-22 VITALS — BP 111/72 | HR 67 | Ht 67.0 in | Wt 202.9 lb

## 2016-10-22 DIAGNOSIS — N3946 Mixed incontinence: Secondary | ICD-10-CM

## 2016-10-22 NOTE — Telephone Encounter (Signed)
Pt needs surgery scheduled.  Please advise. Thanks.

## 2016-10-22 NOTE — Progress Notes (Signed)
10/22/2016 11:31 AM   Alexis Christensen 03/04/1971 [id number]  Referring provider: Perrin Maltese, MD [address] [place], [place] [address]  Chief Complaint  Patient presents with  . Follow-up    PNE    HPI: Patient recently had a PNE for refractory urgency incontinence   Her overactive bladder initially settled down after her sling last year. She failed multiple medications. She no longer has stress incontinence. Cystoscopy was normal.  The patient has only leaked once. She is having less frequency and is now sleeping through the night  She has only been using  Skin is normal. Device easily removed and disconnected and turned off   PMH: Past Medical History:  Diagnosis Date  . Adopted   . Anemia   . Anxiety   . Arthritis   . Bipolar 1 disorder ([place])   . CHF (congestive heart failure) ([place])   . Chronic kidney disease    H/O KIDNEY STONES  . Depression   . DVT (deep venous thrombosis) ([place]) 2016   RIGHT LEG  . Edema leg   . Effusion of knee 12/30/2013  . Family history of adverse reaction to anesthesia    ADOPTED  . GERD (gastroesophageal reflux disease)   . Headache(784.0)    MIGRAINES  . Heart burn   . Heart murmur   . History of kidney stones   . HLD (hyperlipidemia)   . Hx MRSA infection   . Hypothyroidism   . Lower extremity edema   . Post traumatic stress disorder (PTSD)   . PVD (peripheral vascular disease) ([place])   . Seasonal allergies   . Stress incontinence   . Thyroid disease   . Urge incontinence     Surgical History: Past Surgical History:  Procedure Laterality Date  . ABDOMINAL HYSTERECTOMY    . ANKLE SURGERY    . CHOLECYSTECTOMY    . CYSTOSCOPY N/A 04/09/2016   Procedure: CYSTOSCOPY;  Surgeon: Bjorn Loser, MD;  Location: [facility];  Service: Urology;  Laterality: N/A;  . JOINT REPLACEMENT    . KNEE ARTHROSCOPY    . KNEE ARTHROSCOPY WITH LATERAL RELEASE Left 10/18/2015   Procedure: KNEE ARTHROSCOPY LATERAL AND PARTIAL SYNOVECTOMY;   Surgeon: Hessie Knows, MD;  Location: [facility];  Service: Orthopedics;  Laterality: Left;  . Lymph Node removal  2015   Neck  . PUBOVAGINAL SLING N/A 04/09/2016   Procedure: PUBO-VAGINAL SLING/ RETROPUBIC SLING;  Surgeon: Bjorn Loser, MD;  Location: [facility];  Service: Urology;  Laterality: N/A;  . REPLACEMENT TOTAL KNEE Right   . SHOULDER SURGERY Right 2014  . TUBAL LIGATION    . WRIST SURGERY      Home Medications:  Allergies as of 10/22/2016      Reactions   Geodon [ziprasidone Hydrochloride] Other (See Comments)   Numbness ,sob, headaches, blurred vision   Ziprasidone Hcl Anaphylaxis   Other reaction(s): Other (See Comments), Unknown Numbness ,sob, headaches, blurred vision Numbness ,sob, headaches, blurred vision   Strawberry (diagnostic)    rash   Sulfa Antibiotics Hives   Ace Inhibitors Rash   Aripiprazole Rash   Erythromycin Rash, Hives   Hydromorphone Rash   Lamictal [lamotrigine]    Penicillins Rash      Medication List       Accurate as of 10/22/16 11:31 AM. Always use your most recent med list.          ABILIFY IM Inject 400 Doses into the muscle every 30 (thirty) days.   atorvastatin 40  MG tablet Commonly known as:  LIPITOR Take 40 mg by mouth daily at 6 PM.   colestipol 1 g tablet Commonly known as:  COLESTID Take 2 g by mouth daily.   DULoxetine 30 MG capsule Commonly known as:  CYMBALTA Take 30 mg by mouth daily.   gabapentin 600 MG tablet Commonly known as:  NEURONTIN Take 600 mg by mouth 3 (three) times daily.   hydrOXYzine 100 MG capsule Commonly known as:  VISTARIL Take 100 mg by mouth 3 (three) times daily as needed for itching.   hyoscyamine 0.125 MG tablet Commonly known as:  LEVSIN, ANASPAZ Take 0.125 mg by mouth every 4 (four) hours as needed.   levothyroxine 150 MCG tablet Commonly known as:  SYNTHROID, LEVOTHROID Take 150 mcg by mouth daily before breakfast.   meloxicam 7.5 MG tablet Commonly known as:  MOBIC Take 7.5  mg by mouth daily.   methocarbamol 500 MG tablet Commonly known as:  ROBAXIN Take 500 mg by mouth every 8 (eight) hours.   prazosin 2 MG capsule Commonly known as:  MINIPRESS Take 2 mg by mouth at bedtime.   SUBOXONE 8-2 MG Film Generic drug:  Buprenorphine HCl-Naloxone HCl DISSOLVE ONE AND SS FILMS UNDER THE TONGUE QD       Allergies:  Allergies  Allergen Reactions  . Geodon [Ziprasidone Hydrochloride] Other (See Comments)    Numbness ,sob, headaches, blurred vision  . Ziprasidone Hcl Anaphylaxis    Other reaction(s): Other (See Comments), Unknown Numbness ,sob, headaches, blurred vision Numbness ,sob, headaches, blurred vision  . Strawberry (Diagnostic)     rash  . Sulfa Antibiotics Hives  . Ace Inhibitors Rash  . Aripiprazole Rash  . Erythromycin Rash and Hives  . Hydromorphone Rash  . Lamictal [Lamotrigine]   . Penicillins Rash    Family History: Family History  Problem Relation Age of Onset  . Adopted: Yes    Social History:  reports that she has quit smoking. Her smoking use included Cigarettes. She has a 5.00 pack-year smoking history. She has never used smokeless tobacco. She reports that she drinks alcohol. She reports that she does not use drugs.  ROS: UROLOGY Frequent Urination?: Yes Hard to postpone urination?: No Burning/pain with urination?: No Get up at night to urinate?: Yes Leakage of urine?: Yes Urine stream starts and stops?: No Trouble starting stream?: No Do you have to strain to urinate?: No Blood in urine?: No Urinary tract infection?: No Sexually transmitted disease?: No Injury to kidneys or bladder?: No Painful intercourse?: No Weak stream?: No Currently pregnant?: No Vaginal bleeding?: No Last menstrual period?: n  Gastrointestinal Nausea?: No Vomiting?: No Indigestion/heartburn?: No Diarrhea?: No Constipation?: No  Constitutional Fever: No Night sweats?: No Weight loss?: No Fatigue?: No  Skin Skin rash/lesions?:  No Itching?: No  Eyes Blurred vision?: No Double vision?: No  Ears/Nose/Throat Sore throat?: No Sinus problems?: No  Hematologic/Lymphatic Swollen glands?: No Easy bruising?: No  Cardiovascular Leg swelling?: No Chest pain?: No  Respiratory Cough?: No Shortness of breath?: No  Endocrine Excessive thirst?: No  Musculoskeletal Back pain?: No Joint pain?: No  Neurological Headaches?: No Dizziness?: No  Psychologic Depression?: Yes Anxiety?: Yes  Physical Exam: BP 111/72 (BP Location: Left Arm, Patient Position: Sitting, Cuff Size: Normal)   Pulse 67   Ht 5\' 7"  (1.702 m)   Wt 202 lb 14.4 oz (92 kg)   BMI 31.78 kg/m   Constitutional:  Alert and oriented, No acute distress.   Laboratory Data: Lab Results  Component Value Date   WBC 7.4 08/31/2016   HGB 11.0 (L) 08/31/2016   HCT 33.7 (L) 08/31/2016   MCV 83.1 08/31/2016   PLT 342 08/31/2016    Lab Results  Component Value Date   CREATININE 0.70 04/03/2016    No results found for: PSA  No results found for: TESTOSTERONE  No results found for: HGBA1C  Urinalysis    Component Value Date/Time   COLORURINE YELLOW (A) 08/13/2015 1455   APPEARANCEUR Cloudy (A) 09/17/2016 1020   LABSPEC 1.018 08/13/2015 1455   PHURINE 7.0 08/13/2015 1455   GLUCOSEU Negative 09/17/2016 1020   HGBUR NEGATIVE 08/13/2015 1455   BILIRUBINUR Negative 09/17/2016 1020   KETONESUR NEGATIVE 08/13/2015 1455   PROTEINUR Trace (A) 09/17/2016 1020   PROTEINUR NEGATIVE 08/13/2015 1455   UROBILINOGEN 1.0 06/18/2011 1258   NITRITE Negative 09/17/2016 1020   NITRITE NEGATIVE 08/13/2015 1455   LEUKOCYTESUR Negative 09/17/2016 1020    Pertinent Imaging: none  Assessment & Plan:  The patient would like to proceed with stage II InterStim. I am very pleased for her.  There are no diagnoses linked to this encounter.  No Follow-up on file.  Reece Packer, MD  [facility] [address],  [place], [place] [address] [phone]

## 2016-10-23 ENCOUNTER — Other Ambulatory Visit: Payer: Self-pay | Admitting: Radiology

## 2016-10-23 NOTE — Telephone Encounter (Signed)
Notified pt of surgery scheduled with Dr Matilde Sprang on 11/26/16, pre-admit testing appt. & to call Friday prior to surgery for arrival time to [facility]. Answered pt's questions to her satisfaction & pt voices understanding.

## 2016-11-16 ENCOUNTER — Encounter
Admission: RE | Admit: 2016-11-16 | Discharge: 2016-11-16 | Disposition: A | Discharge status: Home / Self Care | Payer: Medicare Other | Source: Ambulatory Visit | Attending: Urology | Admitting: Urology

## 2016-11-16 DIAGNOSIS — Z8679 Personal history of other diseases of the circulatory system: Secondary | ICD-10-CM | POA: Diagnosis not present

## 2016-11-16 DIAGNOSIS — Z01810 Encounter for preprocedural cardiovascular examination: Secondary | ICD-10-CM | POA: Insufficient documentation

## 2016-11-16 DIAGNOSIS — Z01812 Encounter for preprocedural laboratory examination: Secondary | ICD-10-CM | POA: Insufficient documentation

## 2016-11-16 DIAGNOSIS — R9431 Abnormal electrocardiogram [ECG] [EKG]: Secondary | ICD-10-CM | POA: Insufficient documentation

## 2016-11-16 LAB — CBC
HEMATOCRIT: 40.2 % (ref 35.0–47.0)
HEMOGLOBIN: 13.2 g/dL (ref 12.0–16.0)
MCH: 29.3 pg (ref 26.0–34.0)
MCHC: 32.9 g/dL (ref 32.0–36.0)
MCV: 88.8 fL (ref 80.0–100.0)
Platelets: 307 10*3/uL (ref 150–440)
RBC: 4.52 MIL/uL (ref 3.80–5.20)
RDW: 14.7 % — AB (ref 11.5–14.5)
WBC: 6.3 10*3/uL (ref 3.6–11.0)

## 2016-11-16 LAB — DIFFERENTIAL
BASOS PCT: 0 %
Basophils Absolute: 0 10*3/uL (ref 0–0.1)
Eosinophils Absolute: 0 10*3/uL (ref 0–0.7)
Eosinophils Relative: 0 %
LYMPHS ABS: 0.8 10*3/uL — AB (ref 1.0–3.6)
Lymphocytes Relative: 13 %
MONO ABS: 0.1 10*3/uL — AB (ref 0.2–0.9)
MONOS PCT: 2 %
NEUTROS ABS: 5.3 10*3/uL (ref 1.4–6.5)
Neutrophils Relative %: 85 %

## 2016-11-16 LAB — SURGICAL PCR SCREEN
MRSA, PCR: NEGATIVE
Staphylococcus aureus: NEGATIVE

## 2016-11-16 NOTE — Patient Instructions (Signed)
Your procedure is scheduled on:Nov 26, 2016 (Monday) Report to Same Day Surgery 2nd floor medical mall [facility] Entrance-take elevator on left to 2nd floor.  Check in with surgery information desk.) To find out your arrival time please call [phone] between 1PM - 3PM on  Nov 23, 2016 (Friday)  Remember: Instructions that are not followed completely may result in serious medical risk, up to and including death, or upon the discretion of your surgeon and anesthesiologist your surgery may need to be rescheduled.    _x___ 1. Do not eat food or drink liquids after midnight. No gum chewing or  hard candies .                              __x__ 2. No Alcohol for 24 hours before or after surgery.   __x__3. No Smoking for 24 prior to surgery.   ____  4. Bring all medications with you on the day of surgery if instructed.    __x__ 5. Notify your doctor if there is any change in your medical condition     (cold, fever, infections).     Do not wear jewelry, make-up, hairpins, clips or nail polish.  Do not wear lotions, powders, or perfumes. You may wear deodorant.  Do not shave 48 hours prior to surgery. Men may shave face and neck.  Do not bring valuables to the hospital.    [facility] is not responsible for any belongings or valuables.               Contacts, dentures or bridgework may not be worn into surgery.  Leave your suitcase in the car. After surgery it may be brought to your room.  For patients admitted to the hospital, discharge time is determined by your treatment team                    Patients discharged the day of surgery will not be allowed to drive home.  You will need someone to drive you home and stay with you the night of your procedure.    Please read over the following fact sheets that you were given:   [facility] Preparing for Surgery and or MRSA Information   _x___ Take anti-hypertensive (unless it includes a diuretic), cardiac, seizure, asthma,      anti-reflux and psychiatric medicines. These include:  1. LEVOTHYROXINE  2. RANITIDINE  3. USE YOUR SUBOXONE THE DAY OF SURGERY PER DR PISCITELLO  4.  5.  6.  ____Fleets enema or Magnesium Citrate as directed.   ___ Use CHG Soap or sage wipes as directed on instruction sheet   ____ Use inhalers on the day of surgery and bring to hospital day of surgery  ____ Stop Metformin and Janumet 2 days prior to surgery.    ____ Take 1/2 of usual insulin dose the night before surgery and none on the morning     surgery.   _x___ Follow recommendations from Cardiologist, Pulmonologist or PCP regarding          stopping Aspirin, Coumadin, Pllavix ,Eliquis, Effient, or Pradaxa, and Pletal.  X____Stop Anti-inflammatories such as Advil, Aleve, Ibuprofen, Motrin, Naproxen, Naprosyn, Goodies powders or aspirin products. OK to take Tylenol (STOP MELOXICAM ONE WEEK BEFORE SURGERY)   _x___ Stop supplements until after surgery.  But may continue Vitamin D, Vitamin B, and multivitamin        ____ Bring C-Pap to  the hospital.

## 2016-11-16 NOTE — Progress Notes (Signed)
[name] NOTE  Pharmacy consult to dose vancomycin in this 46 yoF for surgical prophylaxis  Plan: Due to body weight being < 100 kg, will give vancomycin 1000 mg IV x 1 dose for surgical prophylaxis on 5/14   Allergies  Allergen Reactions  . Geodon [Ziprasidone Hydrochloride] Other (See Comments)    Numbness ,sob, headaches, blurred vision  . Ziprasidone Hcl Anaphylaxis    Other reaction(s): Other (See Comments), Unknown Numbness ,sob, headaches, blurred vision Numbness ,sob, headaches, blurred vision  . Sulfa Antibiotics Hives  . Ace Inhibitors Rash  . Erythromycin Rash and Hives  . Hydromorphone Rash  . Lamictal [Lamotrigine] Rash  . Strawberry (Diagnostic) Rash    Patient Measurements: Height: 5\' 7"  (170.2 cm) Weight: 197 lb (89.4 kg) IBW/kg (Calculated) : 61.6  Vital Signs: BP: 103/54 (05/04 0918) Pulse Rate: 64 (05/04 0918) Intake/Output from previous day: No intake/output data recorded. Intake/Output from this shift: No intake/output data recorded.  Labs:  Recent Labs  11/16/16 1001  WBC 6.3  HGB 13.2  HCT 40.2  PLT 307   CrCl cannot be calculated (Patient's most recent lab result is older than the maximum 21 days allowed.).   Microbiology: Recent Results (from the past 720 hour(s))  Surgical pcr screen     Status: None   Collection Time: 11/16/16 10:01 AM  Result Value Ref Range Status   MRSA, PCR NEGATIVE NEGATIVE Final   Staphylococcus aureus NEGATIVE NEGATIVE Final    Comment:        The Xpert SA Assay (FDA approved for NASAL specimens in patients over 48 years of age), is one component of a comprehensive surveillance program.  Test performance has been validated by [facility] for patients greater than or equal to 48 year old. It is not intended to diagnose infection nor to guide or monitor treatment.     Medical History: Past Medical History:  Diagnosis Date  . Adopted   . Anemia   . Anxiety   . Arthritis   .  Bipolar 1 disorder ([place])   . CHF (congestive heart failure) ([facility])    Dialostic CHF  . Chronic kidney disease    H/O KIDNEY STONES  . Depression   . DVT (deep venous thrombosis) ([place]) 2016   RIGHT LEG  . Edema leg   . Effusion of knee 12/30/2013  . Family history of adverse reaction to anesthesia    ADOPTED  . GERD (gastroesophageal reflux disease)   . Headache(784.0)    MIGRAINES  . Heart burn   . Heart murmur   . History of kidney stones   . HLD (hyperlipidemia)   . Hx MRSA infection   . Hypothyroidism   . Lower extremity edema   . Post traumatic stress disorder (PTSD)   . PVD (peripheral vascular disease) ([place])   . Seasonal allergies   . Stress incontinence   . Thyroid disease   . Urge incontinence     Darrow Bussing, PharmD Pharmacy Resident 11/16/2016 4:21 PM

## 2016-11-22 NOTE — H&P (Signed)
Alexis Christensen 08-13-1970 [id number]  Referring provider: Perrin Maltese, MD [address] [place],  [address]      Chief Complaint  Patient presents with  . Follow-up    PNE    HPI: Patient recently had a PNE for refractory urgency incontinence   Her overactive bladder initially settled down after her sling last year. She failed multiple medications. She no longer has stress incontinence. Cystoscopy was normal.  The patient has only leaked once. She is having less frequency and is now sleeping through the night  She has only been using  Skin is normal. Device easily removed and disconnected and turned off   PMH:     Past Medical History:  Diagnosis Date  . Adopted   . Anemia   . Anxiety   . Arthritis   . Bipolar 1 disorder ([place])   . CHF (congestive heart failure) ([place])   . Chronic kidney disease    H/O KIDNEY STONES  . Depression   . DVT (deep venous thrombosis) ([place]) 2016   RIGHT LEG  . Edema leg   . Effusion of knee 12/30/2013  . Family history of adverse reaction to anesthesia    ADOPTED  . GERD (gastroesophageal reflux disease)   . Headache(784.0)    MIGRAINES  . Heart burn   . Heart murmur   . History of kidney stones   . HLD (hyperlipidemia)   . Hx MRSA infection   . Hypothyroidism   . Lower extremity edema   . Post traumatic stress disorder (PTSD)   . PVD (peripheral vascular disease) ([place])   . Seasonal allergies   . Stress incontinence   . Thyroid disease   . Urge incontinence     Surgical History:      Past Surgical History:  Procedure Laterality Date  . ABDOMINAL HYSTERECTOMY    . ANKLE SURGERY    . CHOLECYSTECTOMY    . CYSTOSCOPY N/A 04/09/2016   Procedure: CYSTOSCOPY;  Surgeon: Bjorn Loser, MD;  Location: [facility];  Service: Urology;  Laterality: N/A;  . JOINT REPLACEMENT    . KNEE ARTHROSCOPY    . KNEE ARTHROSCOPY WITH LATERAL RELEASE Left 10/18/2015   Procedure: KNEE  ARTHROSCOPY LATERAL AND PARTIAL SYNOVECTOMY;  Surgeon: Hessie Knows, MD;  Location: [facility];  Service: Orthopedics;  Laterality: Left;  . Lymph Node removal  2015   Neck  . PUBOVAGINAL SLING N/A 04/09/2016   Procedure: PUBO-VAGINAL SLING/ RETROPUBIC SLING;  Surgeon: Bjorn Loser, MD;  Location: [facility] ORS;  Service: Urology;  Laterality: N/A;  . REPLACEMENT TOTAL KNEE Right   . SHOULDER SURGERY Right 2014  . TUBAL LIGATION    . WRIST SURGERY      Home Medications:      Allergies as of 10/22/2016      Reactions   Geodon [ziprasidone Hydrochloride] Other (See Comments)   Numbness ,sob, headaches, blurred vision   Ziprasidone Hcl Anaphylaxis   Other reaction(s): Other (See Comments), Unknown Numbness ,sob, headaches, blurred vision Numbness ,sob, headaches, blurred vision   Strawberry (diagnostic)    rash   Sulfa Antibiotics Hives   Ace Inhibitors Rash   Aripiprazole Rash   Erythromycin Rash, Hives   Hydromorphone Rash   Lamictal [lamotrigine]    Penicillins Rash          Medication List           Accurate as of 10/22/16 11:31 AM. Always use your most recent med list.  ABILIFY IM Inject 400 Doses into the muscle every 30 (thirty) days.   atorvastatin 40 MG tablet Commonly known as:  LIPITOR Take 40 mg by mouth daily at 6 PM.   colestipol 1 g tablet Commonly known as:  COLESTID Take 2 g by mouth daily.   DULoxetine 30 MG capsule Commonly known as:  CYMBALTA Take 30 mg by mouth daily.   gabapentin 600 MG tablet Commonly known as:  NEURONTIN Take 600 mg by mouth 3 (three) times daily.   hydrOXYzine 100 MG capsule Commonly known as:  VISTARIL Take 100 mg by mouth 3 (three) times daily as needed for itching.   hyoscyamine 0.125 MG tablet Commonly known as:  LEVSIN, ANASPAZ Take 0.125 mg by mouth every 4 (four) hours as needed.   levothyroxine 150 MCG tablet Commonly known as:  SYNTHROID, LEVOTHROID Take 150  mcg by mouth daily before breakfast.   meloxicam 7.5 MG tablet Commonly known as:  MOBIC Take 7.5 mg by mouth daily.   methocarbamol 500 MG tablet Commonly known as:  ROBAXIN Take 500 mg by mouth every 8 (eight) hours.   prazosin 2 MG capsule Commonly known as:  MINIPRESS Take 2 mg by mouth at bedtime.   SUBOXONE 8-2 MG Film Generic drug:  Buprenorphine HCl-Naloxone HCl DISSOLVE ONE AND SS FILMS UNDER THE TONGUE QD       Allergies:       Allergies  Allergen Reactions  . Geodon [Ziprasidone Hydrochloride] Other (See Comments)    Numbness ,sob, headaches, blurred vision  . Ziprasidone Hcl Anaphylaxis    Other reaction(s): Other (See Comments), Unknown Numbness ,sob, headaches, blurred vision Numbness ,sob, headaches, blurred vision  . Strawberry (Diagnostic)     rash  . Sulfa Antibiotics Hives  . Ace Inhibitors Rash  . Aripiprazole Rash  . Erythromycin Rash and Hives  . Hydromorphone Rash  . Lamictal [Lamotrigine]   . Penicillins Rash    Family History:      Family History  Problem Relation Age of Onset  . Adopted: Yes    Social History:  reports that she has quit smoking. Her smoking use included Cigarettes. She has a 5.00 pack-year smoking history. She has never used smokeless tobacco. She reports that she drinks alcohol. She reports that she does not use drugs.  ROS: UROLOGY Frequent Urination?: Yes Hard to postpone urination?: No Burning/pain with urination?: No Get up at night to urinate?: Yes Leakage of urine?: Yes Urine stream starts and stops?: No Trouble starting stream?: No Do you have to strain to urinate?: No Blood in urine?: No Urinary tract infection?: No Sexually transmitted disease?: No Injury to kidneys or bladder?: No Painful intercourse?: No Weak stream?: No Currently pregnant?: No Vaginal bleeding?: No Last menstrual period?: n  Gastrointestinal Nausea?: No Vomiting?: No Indigestion/heartburn?:  No Diarrhea?: No Constipation?: No  Constitutional Fever: No Night sweats?: No Weight loss?: No Fatigue?: No  Skin Skin rash/lesions?: No Itching?: No  Eyes Blurred vision?: No Double vision?: No  Ears/Nose/Throat Sore throat?: No Sinus problems?: No  Hematologic/Lymphatic Swollen glands?: No Easy bruising?: No  Cardiovascular Leg swelling?: No Chest pain?: No  Respiratory Cough?: No Shortness of breath?: No  Endocrine Excessive thirst?: No  Musculoskeletal Back pain?: No Joint pain?: No  Neurological Headaches?: No Dizziness?: No  Psychologic Depression?: Yes Anxiety?: Yes  Physical Exam: BP 111/72 (BP Location: Left Arm, Patient Position: Sitting, Cuff Size: Normal)   Pulse 67   Ht 5\' 7"  (1.702 m)   Wt 202  lb 14.4 oz (92 kg)   BMI 31.78 kg/m   Constitutional:  Alert and oriented, No acute distress.  PULSE NORMAL; HR NORMAL; BREATHS NORMAL TO AUSCULTATION AND HEART SOUNDS NORMAL   Laboratory Data: RecentLabs       Lab Results  Component Value Date   WBC 7.4 08/31/2016   HGB 11.0 (L) 08/31/2016   HCT 33.7 (L) 08/31/2016   MCV 83.1 08/31/2016   PLT 342 08/31/2016      RecentLabs       Lab Results  Component Value Date   CREATININE 0.70 04/03/2016      RecentLabs  No results found for: PSA    RecentLabs  No results found for: TESTOSTERONE    RecentLabs  No results found for: HGBA1C    Urinalysis Labs(Brief)          Component Value Date/Time   COLORURINE YELLOW (A) 08/13/2015 1455   APPEARANCEUR Cloudy (A) 09/17/2016 1020   LABSPEC 1.018 08/13/2015 1455   PHURINE 7.0 08/13/2015 1455   GLUCOSEU Negative 09/17/2016 1020   HGBUR NEGATIVE 08/13/2015 1455   BILIRUBINUR Negative 09/17/2016 1020   KETONESUR NEGATIVE 08/13/2015 1455   PROTEINUR Trace (A) 09/17/2016 1020   PROTEINUR NEGATIVE 08/13/2015 1455   UROBILINOGEN 1.0 06/18/2011 1258   NITRITE Negative 09/17/2016  1020   NITRITE NEGATIVE 08/13/2015 1455   LEUKOCYTESUR Negative 09/17/2016 1020      Pertinent Imaging: none  Assessment & Plan:  The patient would like to proceed with stage II InterStim. I am very pleased for her.  After a thorough review of the management options for the patient's condition the patient  elected to proceed with surgical therapy as noted above. We have discussed the potential benefits and risks of the procedure, side effects of the proposed treatment, the likelihood of the patient achieving the goals of the procedure, and any potential problems that might occur during the procedure or recuperation. Informed consent has been obtained.  There are no diagnoses linked to this encounter.  No Follow-up on file.

## 2016-11-26 ENCOUNTER — Ambulatory Visit
Admission: RE | Admit: 2016-11-26 | Discharge: 2016-11-26 | Disposition: A | Discharge status: Home / Self Care | Payer: Medicare Other | Source: Ambulatory Visit | Attending: Urology | Admitting: Urology

## 2016-11-26 ENCOUNTER — Encounter
Admission: RE | Disposition: A | Discharge status: Home / Self Care | Payer: Self-pay | Source: Ambulatory Visit | Attending: Urology

## 2016-11-26 ENCOUNTER — Ambulatory Visit: Payer: Medicare Other | Admitting: Certified Registered Nurse Anesthetist

## 2016-11-26 ENCOUNTER — Ambulatory Visit: Payer: Medicare Other

## 2016-11-26 ENCOUNTER — Encounter: Payer: Self-pay | Admitting: *Deleted

## 2016-11-26 DIAGNOSIS — Z87891 Personal history of nicotine dependence: Secondary | ICD-10-CM | POA: Insufficient documentation

## 2016-11-26 DIAGNOSIS — N3941 Urge incontinence: Secondary | ICD-10-CM | POA: Diagnosis present

## 2016-11-26 DIAGNOSIS — E039 Hypothyroidism, unspecified: Secondary | ICD-10-CM | POA: Insufficient documentation

## 2016-11-26 DIAGNOSIS — E785 Hyperlipidemia, unspecified: Secondary | ICD-10-CM | POA: Diagnosis not present

## 2016-11-26 DIAGNOSIS — Z880 Allergy status to penicillin: Secondary | ICD-10-CM | POA: Diagnosis not present

## 2016-11-26 DIAGNOSIS — F329 Major depressive disorder, single episode, unspecified: Secondary | ICD-10-CM | POA: Insufficient documentation

## 2016-11-26 DIAGNOSIS — I509 Heart failure, unspecified: Secondary | ICD-10-CM | POA: Diagnosis not present

## 2016-11-26 DIAGNOSIS — Z882 Allergy status to sulfonamides status: Secondary | ICD-10-CM | POA: Diagnosis not present

## 2016-11-26 DIAGNOSIS — F4310 Post-traumatic stress disorder, unspecified: Secondary | ICD-10-CM | POA: Insufficient documentation

## 2016-11-26 DIAGNOSIS — F319 Bipolar disorder, unspecified: Secondary | ICD-10-CM | POA: Diagnosis not present

## 2016-11-26 DIAGNOSIS — M1990 Unspecified osteoarthritis, unspecified site: Secondary | ICD-10-CM | POA: Diagnosis not present

## 2016-11-26 DIAGNOSIS — N189 Chronic kidney disease, unspecified: Secondary | ICD-10-CM | POA: Diagnosis not present

## 2016-11-26 DIAGNOSIS — I739 Peripheral vascular disease, unspecified: Secondary | ICD-10-CM | POA: Diagnosis not present

## 2016-11-26 DIAGNOSIS — K219 Gastro-esophageal reflux disease without esophagitis: Secondary | ICD-10-CM | POA: Insufficient documentation

## 2016-11-26 DIAGNOSIS — Z86718 Personal history of other venous thrombosis and embolism: Secondary | ICD-10-CM | POA: Diagnosis not present

## 2016-11-26 DIAGNOSIS — Z8614 Personal history of Methicillin resistant Staphylococcus aureus infection: Secondary | ICD-10-CM | POA: Insufficient documentation

## 2016-11-26 HISTORY — PX: INTERSTIM IMPLANT PLACEMENT: SHX5130

## 2016-11-26 SURGERY — INSERTION, SACRAL NERVE STIMULATOR, INTERSTIM, STAGE 1
Anesthesia: General | Wound class: Clean

## 2016-11-26 MED ORDER — ONDANSETRON HCL 4 MG/2ML IJ SOLN: 4.0000 mg | Freq: Once | INTRAMUSCULAR | Status: DC | PRN

## 2016-11-26 MED ORDER — PROPOFOL 500 MG/50ML IV EMUL
INTRAVENOUS | Status: DC | PRN
  Administered 2016-11-26: 35 ug/kg/min via INTRAVENOUS

## 2016-11-26 MED ORDER — PROPOFOL 500 MG/50ML IV EMUL
INTRAVENOUS | Status: AC
  Filled 2016-11-26: qty 50

## 2016-11-26 MED ORDER — LIDOCAINE-EPINEPHRINE (PF) 1 %-1:200000 IJ SOLN
INTRAMUSCULAR | Status: DC | PRN
  Administered 2016-11-26: 16 mL

## 2016-11-26 MED ORDER — FENTANYL CITRATE (PF) 100 MCG/2ML IJ SOLN: 25.0000 ug | INTRAMUSCULAR | Status: DC | PRN

## 2016-11-26 MED ORDER — BUPIVACAINE-EPINEPHRINE 0.5% -1:200000 IJ SOLN
INTRAMUSCULAR | Status: DC | PRN
  Administered 2016-11-26: 16 mL

## 2016-11-26 MED ORDER — VANCOMYCIN HCL IN DEXTROSE 1-5 GM/200ML-% IV SOLN
1000.0000 mg | INTRAVENOUS | Status: AC
  Administered 2016-11-26: 1000 mg via INTRAVENOUS

## 2016-11-26 MED ORDER — FENTANYL CITRATE (PF) 100 MCG/2ML IJ SOLN
INTRAMUSCULAR | Status: AC
  Filled 2016-11-26: qty 2

## 2016-11-26 MED ORDER — VANCOMYCIN HCL IN DEXTROSE 1-5 GM/200ML-% IV SOLN
INTRAVENOUS | Status: AC
  Filled 2016-11-26: qty 200

## 2016-11-26 MED ORDER — LIDOCAINE-EPINEPHRINE (PF) 1 %-1:200000 IJ SOLN
INTRAMUSCULAR | Status: AC
  Filled 2016-11-26: qty 30

## 2016-11-26 MED ORDER — MIDAZOLAM HCL 2 MG/2ML IJ SOLN
INTRAMUSCULAR | Status: AC
  Filled 2016-11-26: qty 2

## 2016-11-26 MED ORDER — BUPIVACAINE-EPINEPHRINE (PF) 0.5% -1:200000 IJ SOLN
INTRAMUSCULAR | Status: AC
  Filled 2016-11-26: qty 30

## 2016-11-26 MED ORDER — MIDAZOLAM HCL 2 MG/2ML IJ SOLN
INTRAMUSCULAR | Status: DC | PRN
  Administered 2016-11-26: 1 mg via INTRAVENOUS
  Administered 2016-11-26: 2 mg via INTRAVENOUS

## 2016-11-26 MED ORDER — LACTATED RINGERS IV SOLN
INTRAVENOUS | Status: DC
  Administered 2016-11-26: 11:00:00 via INTRAVENOUS

## 2016-11-26 MED ORDER — FENTANYL CITRATE (PF) 100 MCG/2ML IJ SOLN
INTRAMUSCULAR | Status: DC | PRN
  Administered 2016-11-26 (×2): 25 ug via INTRAVENOUS

## 2016-11-26 MED ORDER — EPHEDRINE SULFATE 50 MG/ML IJ SOLN
INTRAMUSCULAR | Status: DC | PRN
  Administered 2016-11-26: 5 mg via INTRAVENOUS

## 2016-11-26 SURGICAL SUPPLY — 70 items
ADH SKN CLS APL DERMABOND .7 (GAUZE/BANDAGES/DRESSINGS) ×1
ANTNA NRSTM XTRN TELEM NS LF (UROLOGICAL SUPPLIES) ×1
APL SKNCLS STERI-STRIP NONHPOA (GAUZE/BANDAGES/DRESSINGS)
BAG URINE DRAINAGE (UROLOGICAL SUPPLIES) ×1 IMPLANT
BENZOIN TINCTURE PRP APPL 2/3 (GAUZE/BANDAGES/DRESSINGS) ×2 IMPLANT
BLADE SURG 15 STRL LF DISP TIS (BLADE) ×1 IMPLANT
BLADE SURG 15 STRL SS (BLADE) ×3
CABLE TEST STIMULATION (UROLOGICAL SUPPLIES) ×2 IMPLANT
CLOSURE WOUND 1/2 X4 (GAUZE/BANDAGES/DRESSINGS) ×1
COVER BACK TABLE 60X90IN (DRAPES) ×1 IMPLANT
COVER MAYO STAND STRL (DRAPES) ×1 IMPLANT
COVER PROBE W GEL 5X96 (DRAPES) ×3 IMPLANT
DERMABOND ADVANCED (GAUZE/BANDAGES/DRESSINGS) ×2
DERMABOND ADVANCED .7 DNX12 (GAUZE/BANDAGES/DRESSINGS) ×1 IMPLANT
DRAPE C-ARM 42X72 X-RAY (DRAPES) ×3 IMPLANT
DRAPE INCISE 23X17 IOBAN STRL (DRAPES)
DRAPE INCISE 23X17 STRL (DRAPES) ×1 IMPLANT
DRAPE INCISE IOBAN 23X17 STRL (DRAPES) IMPLANT
DRAPE INCISE IOBAN 66X45 STRL (DRAPES) ×3 IMPLANT
DRAPE LAPAROSCOPIC ABDOMINAL (DRAPES) ×1 IMPLANT
DRAPE SHEET LG 3/4 BI-LAMINATE (DRAPES) ×3 IMPLANT
DRSG COVADERM PLUS 2X2 (GAUZE/BANDAGES/DRESSINGS) IMPLANT
DRSG TEGADERM 2-3/8X2-3/4 SM (GAUZE/BANDAGES/DRESSINGS) ×5 IMPLANT
DRSG TEGADERM 4X4.75 (GAUZE/BANDAGES/DRESSINGS) ×3 IMPLANT
DRSG TELFA 3X8 NADH (GAUZE/BANDAGES/DRESSINGS) ×3 IMPLANT
ELECT REM PT RETURN 9FT ADLT (ELECTROSURGICAL) ×3
ELECTRODE REM PT RTRN 9FT ADLT (ELECTROSURGICAL) ×1 IMPLANT
GAUZE SPONGE 4X4 12PLY STRL (GAUZE/BANDAGES/DRESSINGS) ×1 IMPLANT
GLOVE BIO SURGEON STRL SZ7.5 (GLOVE) ×7 IMPLANT
GOWN STRL REUS W/ TWL LRG LVL3 (GOWN DISPOSABLE) ×1 IMPLANT
GOWN STRL REUS W/ TWL XL LVL3 (GOWN DISPOSABLE) ×1 IMPLANT
GOWN STRL REUS W/TWL LRG LVL3 (GOWN DISPOSABLE) ×3
GOWN STRL REUS W/TWL XL LVL3 (GOWN DISPOSABLE) ×3
HOLDER FOLEY CATH W/STRAP (MISCELLANEOUS) ×1 IMPLANT
INTRODUCER GUIDE DILATR SHEATH (SET/KITS/TRAYS/PACK) ×3 IMPLANT
KIT INTERSTIM LEAD TINED 28CM (Urological Implant) ×2 IMPLANT
KIT RM TURNOVER CYSTO AR (KITS) ×3 IMPLANT
NEEDLE FORAMEN 20GA 3.5  9CM (NEEDLE) IMPLANT
NEEDLE FORAMEN 20GA 5  12.5CM (NEEDLE) IMPLANT
NEEDLE HYPO 22GX1.5 SAFETY (NEEDLE) ×3 IMPLANT
NEUROSTIMULATOR 1.7X2X.06 (UROLOGICAL SUPPLIES) ×2 IMPLANT
PACK BASIN DAY SURGERY FS (CUSTOM PROCEDURE TRAY) ×1 IMPLANT
PACK BASIN MINOR ARMC (MISCELLANEOUS) ×2 IMPLANT
PAD DRESSING TELFA 3X8 NADH (GAUZE/BANDAGES/DRESSINGS) ×1 IMPLANT
PENCIL BUTTON HOLSTER BLD 10FT (ELECTRODE) IMPLANT
PROGRAMMER ANTENNA EXT (UROLOGICAL SUPPLIES) ×3 IMPLANT
PROGRAMMER STIMUL 2.2X1.1X3.7 (UROLOGICAL SUPPLIES) ×3 IMPLANT
SPONGE GAUZE 4X4 12PLY STER LF (GAUZE/BANDAGES/DRESSINGS) IMPLANT
STIMULATOR INTERSTIM 2X1.7X.3 (Orthopedic Implant) ×3 IMPLANT
STRIP CLOSURE SKIN 1/2X4 (GAUZE/BANDAGES/DRESSINGS) ×2 IMPLANT
SUCTION FRAZIER HANDLE 10FR (MISCELLANEOUS) ×2
SUCTION TUBE FRAZIER 10FR DISP (MISCELLANEOUS) ×1 IMPLANT
SUT SILK 2 0 (SUTURE)
SUT SILK 2-0 18XBRD TIE 12 (SUTURE) ×1 IMPLANT
SUT VIC AB 3-0 SH 27 (SUTURE) ×3
SUT VIC AB 3-0 SH 27X BRD (SUTURE) ×2 IMPLANT
SUT VIC AB 4-0 PS2 18 (SUTURE) ×2 IMPLANT
SUT VIC AB 4-0 RB1 27 (SUTURE) ×6
SUT VIC AB 4-0 RB1 27X BRD (SUTURE) ×3 IMPLANT
SUT VICRYL 4-0 PS2 18IN ABS (SUTURE) ×1 IMPLANT
SYR BULB IRRIG 60ML STRL (SYRINGE) ×1 IMPLANT
SYR BULB IRRIGATION 50ML (SYRINGE) ×1 IMPLANT
SYR CONTROL 10ML LL (SYRINGE) ×2 IMPLANT
SYRINGE 10CC LL (SYRINGE) ×4 IMPLANT
TOWEL OR 17X24 6PK STRL BLUE (TOWEL DISPOSABLE) ×2 IMPLANT
TOWEL OR 17X26 4PK STRL BLUE (TOWEL DISPOSABLE) ×1 IMPLANT
TRAY FOLEY W/METER SILVER 16FR (SET/KITS/TRAYS/PACK) ×1 IMPLANT
TUBE CONNECTING 12'X1/4 (SUCTIONS)
TUBE CONNECTING 12X1/4 (SUCTIONS) IMPLANT
WATER STERILE IRR 500ML POUR (IV SOLUTION) ×3 IMPLANT

## 2016-11-26 NOTE — Progress Notes (Signed)
HR NORMAL  HEART SOUNDS NORMAL BREATHS NORMAL TO AUSCULTATION

## 2016-11-26 NOTE — Op Note (Signed)
Preoperative diagnosis: Refractory urgency incontinence and frequency Postoperative diagnosis: Refractory urgency incontinence and frequency Surgery: InterStim stage I and stage II implantation plus impedance check Surgeon: Dr. Nicki Reaper Lillyrose Reitan  The patient has the above diagnoses and consented to the above procedure. Extra care was taken with skin preparation to minimize the risk of infection. She was placed in the prone position. Bony landmarks and a 3.5 inch framing needles marked the S3 foramina on the left and right.  I instilled 15 mL of a lidocaine epinephrine mixture for the skin and soft tissue to the bone table. A 3.5 inch foramen needle easily found the left foramen. It was at S3. Lateral views were excellent. She had excellent bellows response.  Introducer was introduced and foramen needle was removed. 1 cm appropriate depth scalpel incision was made. The white trocar was passed to the appropriate bone table level. His inner trocar was removed. A well-prepared stylette lead was placed to the appropriate level. She had excellent bellows response at all 4 positions. The inner wire and white trocar reducing gaze under fluoroscopic guidance. I retested with the same motor responses  A 4 cm left lateral buttock incision was made at the appropriate depth. Ice dissected down through subcutaneous tissue and made an appropriate pocket. With the delivery device I delivered the lead from medial to lateral. It was fashioned to the IPG which was then delivered into the pocket.  As a separate procedure impedance was checked and it was normal in all 4 leads.  The midline incision was closed with interrupted 4-0 Vicryl. The lateral incision in the left upper buttock was closed with 3-0 Vicryl followed by 4-0 subcuticular. Sterile dressing was applied. Dermabond was applied  I was very pleased to the procedure and hopefully it will reach her treatment goal

## 2016-11-26 NOTE — Anesthesia Post-op Follow-up Note (Cosign Needed)
Anesthesia QCDR form completed.        

## 2016-11-26 NOTE — Discharge Instructions (Signed)
I have reviewed discharge instructions in detail with the patient. They will follow-up with me or their physician as scheduled. My nurse will also be calling the patients as per protocol.  °AMBULATORY SURGERY  °DISCHARGE INSTRUCTIONS ° ° °1) The drugs that you were given will stay in your system until tomorrow so for the next 24 hours you should not: ° °A) Drive an automobile °B) Make any legal decisions °C) Drink any alcoholic beverage ° ° °2) You may resume regular meals tomorrow.  Today it is better to start with liquids and gradually work up to solid foods. ° °You may eat anything you prefer, but it is better to start with liquids, then soup and crackers, and gradually work up to solid foods. ° ° °3) Please notify your doctor immediately if you have any unusual bleeding, trouble breathing, redness and pain at the surgery site, drainage, fever, or pain not relieved by medication. ° ° ° °4) Additional Instructions: ° ° ° ° ° ° ° °Please contact your physician with any problems or Same Day Surgery at [phone], Monday through Friday 6 am to 4 pm, or [place] [facility] [place] Main number at [phone]. °

## 2016-11-26 NOTE — H&P (View-Only) (Signed)
HR NORMAL  HEART SOUNDS NORMAL BREATHS NORMAL TO AUSCULTATION

## 2016-11-26 NOTE — Anesthesia Postprocedure Evaluation (Signed)
Anesthesia Post Note  Patient: Alexis Christensen  Procedure(s) Performed: Procedure(s) (LRB): INTERSTIM IMPLANT FIRST STAGE (N/A) INTERSTIM IMPLANT SECOND STAGE (N/A)  Patient location during evaluation: PACU Anesthesia Type: General Level of consciousness: awake and alert Pain management: pain level controlled Vital Signs Assessment: post-procedure vital signs reviewed and stable Respiratory status: spontaneous breathing and respiratory function stable Cardiovascular status: stable Anesthetic complications: no     Last Vitals:  Vitals:   11/26/16 1031 11/26/16 1250  BP: 119/66   Pulse: (!) 59   Resp: 14   Temp: 36.4 C (P) 36.2 C    Last Pain:  Vitals:   11/26/16 1031  TempSrc: Tympanic                 Alexis Christensen

## 2016-11-26 NOTE — Interval H&P Note (Signed)
History and Physical Interval Note:  11/26/2016 11:27 AM  Alexis Christensen  has presented today for surgery, with the diagnosis of refractory urgency incontinence  The various methods of treatment have been discussed with the patient and family. After consideration of risks, benefits and other options for treatment, the patient has consented to  Procedure(s): INTERSTIM IMPLANT FIRST STAGE (N/A) INTERSTIM IMPLANT SECOND STAGE (N/A) as a surgical intervention .  The patient's history has been reviewed, patient examined, no change in status, stable for surgery.  I have reviewed the patient's chart and labs.  Questions were answered to the patient's satisfaction.     Mili Piltz A

## 2016-11-26 NOTE — Anesthesia Procedure Notes (Signed)
Performed by: Mehul Rudin Pre-anesthesia Checklist: Patient identified, Emergency Drugs available, Suction available, Patient being monitored and Timeout performed Patient Re-evaluated:Patient Re-evaluated prior to inductionOxygen Delivery Method: Nasal cannula Intubation Type: IV induction       

## 2016-11-26 NOTE — Anesthesia Preprocedure Evaluation (Signed)
Anesthesia Evaluation  Patient identified by MRN, date of birth, ID band Patient awake    Reviewed: Allergy & Precautions, NPO status , Patient's Chart, lab work & pertinent test results  History of Anesthesia Complications (+) Family history of anesthesia reaction and history of anesthetic complications (sister problem unknown)  Airway Mallampati: III       Dental   Pulmonary former smoker,           Cardiovascular +CHF (diastolic disfunction)       Neuro/Psych Anxiety Depression Bipolar Disorder    GI/Hepatic GERD  ,  Endo/Other  Hypothyroidism   Renal/GU Renal InsufficiencyRenal disease     Musculoskeletal   Abdominal   Peds  Hematology  (+) anemia ,   Anesthesia Other Findings   Reproductive/Obstetrics                             Anesthesia Physical Anesthesia Plan  ASA: III  Anesthesia Plan: General   Post-op Pain Management:    Induction: Intravenous  Airway Management Planned: Nasal Cannula  Additional Equipment:   Intra-op Plan:   Post-operative Plan:   Informed Consent: I have reviewed the patients History and Physical, chart, labs and discussed the procedure including the risks, benefits and alternatives for the proposed anesthesia with the patient or authorized representative who has indicated his/her understanding and acceptance.     Plan Discussed with:   Anesthesia Plan Comments:         Anesthesia Quick Evaluation

## 2016-11-26 NOTE — Transfer of Care (Signed)
Immediate Anesthesia Transfer of Care Note  Patient: Alexis Christensen  Procedure(s) Performed: Procedure(s): INTERSTIM IMPLANT FIRST STAGE (N/A) INTERSTIM IMPLANT SECOND STAGE (N/A)  Patient Location: PACU  Anesthesia Type:General  Level of Consciousness: awake and alert   Airway & Oxygen Therapy: Patient Spontanous Breathing and Patient connected to nasal cannula oxygen  Post-op Assessment: Report given to RN and Post -op Vital signs reviewed and stable  Post vital signs: Reviewed and stable  Last Vitals:  Vitals:   11/26/16 1031  BP: 119/66  Pulse: (!) 59  Resp: 14  Temp: 36.4 C    Last Pain:  Vitals:   11/26/16 1031  TempSrc: Tympanic         Complications: No apparent anesthesia complications

## 2016-11-26 NOTE — Interval H&P Note (Signed)
History and Physical Interval Note:  11/26/2016 11:27 AM  Alexis Christensen  has presented today for surgery, with the diagnosis of refractory urgency incontinence  The various methods of treatment have been discussed with the patient and family. After consideration of risks, benefits and other options for treatment, the patient has consented to  Procedure(s): INTERSTIM IMPLANT FIRST STAGE (N/A) INTERSTIM IMPLANT SECOND STAGE (N/A) as a surgical intervention .  The patient's history has been reviewed, patient examined, no change in status, stable for surgery.  I have reviewed the patient's chart and labs.  Questions were answered to the patient's satisfaction.     [name] A

## 2016-11-27 ENCOUNTER — Encounter: Payer: Self-pay | Admitting: Urology

## 2016-11-28 ENCOUNTER — Inpatient Hospital Stay: Payer: Medicare Other | Attending: Oncology

## 2016-11-28 NOTE — Progress Notes (Deleted)
[facility]  [phone] Fax:(336) [phone]  ID: Alexis Christensen OB: November 08, 1970  MR#: [id number]  FKC#:[id number]  Patient Care Team: Perrin Maltese, MD as PCP - General (Internal Medicine) Christene Lye, MD as Consulting Physician (General Surgery)  CHIEF COMPLAINT: Iron deficiency anemia  INTERVAL HISTORY: Patient returns to clinic today for further evaluation and laboratory work. She continues to have joint pain as well as fatigue, but otherwise feels well.  She has no neurologic complaints. She denies any recent fevers or illnesses. She has a good appetite and denies weight loss. She has no chest pain or shortness of breath. She denies any nausea, vomiting, constipation, or diarrhea. She has no melena or hematochezia. She has no urinary complaints. Patient offers no further specific complaints today.  REVIEW OF SYSTEMS:   Review of Systems  Constitutional: Positive for malaise/fatigue. Negative for fever and weight loss.  Respiratory: Negative.  Negative for cough and shortness of breath.   Cardiovascular: Negative.  Negative for chest pain and leg swelling.  Gastrointestinal: Negative.   Genitourinary: Negative.   Musculoskeletal: Positive for joint pain.  Neurological: Negative.  Negative for focal weakness and weakness.  Psychiatric/Behavioral: The patient is nervous/anxious.     As per HPI. Otherwise, a complete review of systems is negative.  PAST MEDICAL HISTORY: Past Medical History:  Diagnosis Date  . Adopted   . Anemia   . Anxiety   . Arthritis   . Bipolar 1 disorder ([place])   . CHF (congestive heart failure) (H[facility])    Dialostic CHF  . Chronic kidney disease    H/O KIDNEY STONES  . Depression   . DVT (deep venous thrombosis) ([place]) 2016   RIGHT LEG  . Edema leg   . Effusion of knee 12/30/2013  . Family history of adverse reaction to anesthesia    ADOPTED  . GERD (gastroesophageal reflux disease)   . Headache(784.0)    MIGRAINES  . Heart burn   . Heart murmur   . History of kidney stones   . HLD (hyperlipidemia)   . Hx MRSA infection   . Hypothyroidism   . Lower extremity edema   . Post traumatic stress disorder (PTSD)   . PVD (peripheral vascular disease) ([place])   . Seasonal allergies   . Stress incontinence   . Thyroid disease   . Urge incontinence     PAST SURGICAL HISTORY: Past Surgical History:  Procedure Laterality Date  . ABDOMINAL HYSTERECTOMY    . ANKLE SURGERY    . CHOLECYSTECTOMY    . CYSTOSCOPY N/A 04/09/2016   Procedure: CYSTOSCOPY;  Surgeon: Bjorn Loser, MD;  Location: [facility] ORS;  Service: Urology;  Laterality: N/A;  . DILATION AND CURETTAGE OF UTERUS    . INTERSTIM IMPLANT PLACEMENT N/A 11/26/2016   Procedure: Barrie Lyme IMPLANT FIRST STAGE;  Surgeon: Bjorn Loser, MD;  Location: [facility];  Service: Urology;  Laterality: N/A;  . INTERSTIM IMPLANT PLACEMENT N/A 11/26/2016   Procedure: Barrie Lyme IMPLANT SECOND STAGE;  Surgeon: Bjorn Loser, MD;  Location: [facility];  Service: Urology;  Laterality: N/A;  . JOINT REPLACEMENT Right    knee  . KNEE ARTHROSCOPY Bilateral   . KNEE ARTHROSCOPY WITH LATERAL RELEASE Left 10/18/2015   Procedure: KNEE ARTHROSCOPY LATERAL AND PARTIAL SYNOVECTOMY;  Surgeon: Hessie Knows, MD;  Location: [facility];  Service: Orthopedics;  Laterality: Left;  . Lymph Node removal  2015   Neck  . PUBOVAGINAL SLING N/A 04/09/2016   Procedure: PUBO-VAGINAL SLING/ RETROPUBIC SLING;  Surgeon: Bjorn Loser, MD;  Location: [facility];  Service: Urology;  Laterality: N/A;  . REPLACEMENT TOTAL KNEE Right   . SHOULDER SURGERY Right 2014  . TUBAL LIGATION    . WRIST SURGERY Right    metal plate    FAMILY HISTORY: Family History  Problem Relation Age of Onset  . Adopted: Yes  . Family history unknown: Yes    ADVANCED DIRECTIVES (Y/N):  N  HEALTH MAINTENANCE: Social History  Substance Use Topics  . Smoking status: Former Smoker    Packs/day: 0.25     Years: 20.00    Types: Cigarettes    Quit date: 11/13/2012  . Smokeless tobacco: Never Used     Comment: STRESS RELATED  . Alcohol use 0.0 oz/week     Comment: occasional     Colonoscopy:  PAP:  Bone density:  Lipid panel:  Allergies  Allergen Reactions  . Geodon [Ziprasidone Hydrochloride] Other (See Comments)    Numbness ,sob, headaches, blurred vision  . Ziprasidone Hcl Anaphylaxis    Other reaction(s): Other (See Comments), Unknown Numbness ,sob, headaches, blurred vision Numbness ,sob, headaches, blurred vision  . Sulfa Antibiotics Hives  . Ace Inhibitors Rash  . Erythromycin Rash and Hives  . Hydromorphone Rash  . Lamictal [Lamotrigine] Rash  . Strawberry (Diagnostic) Rash    Current Outpatient Prescriptions  Medication Sig Dispense Refill  . ARIPiprazole (ABILIFY MAINTENA IM) Inject 400 mg into the muscle every 30 (thirty) days.    Marland Kitchen atorvastatin (LIPITOR) 40 MG tablet Take 40 mg by mouth daily at 6 PM.    . clobetasol cream (TEMOVATE) 8.52 % Apply 1 application topically 2 (two) times daily as needed (RASH).    Marland Kitchen colestipol (COLESTID) 1 g tablet Take 2 g by mouth daily at 12 noon.     . DULoxetine (CYMBALTA) 60 MG capsule Take 60 mg by mouth at bedtime.     . fluticasone (FLONASE) 50 MCG/ACT nasal spray Place 1 spray into both nostrils daily.    Marland Kitchen gabapentin (NEURONTIN) 600 MG tablet Take 1,800 mg by mouth at bedtime. Patient stated she takes three 600  mg tablets at bedtime.    . hydrOXYzine (ATARAX/VISTARIL) 25 MG tablet Take 50 mg by mouth every 8 (eight) hours as needed for itching.    . hyoscyamine (LEVSIN, ANASPAZ) 0.125 MG tablet Take 0.125 mg by mouth every 4 (four) hours as needed for bladder spasms.     Marland Kitchen levocetirizine (XYZAL) 5 MG tablet Take 5 mg by mouth every evening.    Marland Kitchen levothyroxine (SYNTHROID, LEVOTHROID) 150 MCG tablet Take 150 mcg by mouth daily before breakfast.     . meloxicam (MOBIC) 7.5 MG tablet Take 7.5 mg by mouth daily as needed for pain.      . methocarbamol (ROBAXIN) 500 MG tablet Take 500 mg by mouth every 8 (eight) hours as needed for muscle spasms.     . ranitidine (ZANTAC) 150 MG tablet Take 150 mg by mouth 2 (two) times daily.    . SUBOXONE 8-2 MG FILM DISSOLVE ONE AND HALF  FILMS UNDER THE TONGUE ONCE DAILY  0   No current facility-administered medications for this visit.     OBJECTIVE: There were no vitals filed for this visit.   There is no height or weight on file to calculate BMI.    ECOG FS:0 - Asymptomatic  General: Well-developed, well-nourished, no acute distress. Eyes: Pink conjunctiva, anicteric sclera. Lungs: Clear to auscultation bilaterally. Heart: Regular rate and rhythm. No  rubs, murmurs, or gallops. Abdomen: Soft, nontender, nondistended. No organomegaly noted, normoactive bowel sounds. Musculoskeletal: No edema, cyanosis, or clubbing. Neuro: Alert, answering all questions appropriately. Cranial nerves grossly intact. Skin: No rashes or petechiae noted. Psych: Normal affect.   LAB RESULTS:  Lab Results  Component Value Date   NA 139 04/03/2016   K 3.6 04/03/2016   CL 106 04/03/2016   CO2 26 04/03/2016   GLUCOSE 102 (H) 04/03/2016   BUN 11 04/03/2016   CREATININE 0.70 04/03/2016   CALCIUM 9.0 04/03/2016   PROT 7.8 08/13/2015   ALBUMIN 3.9 08/13/2015   AST 14 (L) 08/13/2015   ALT 13 (L) 08/13/2015   ALKPHOS 83 08/13/2015   BILITOT 0.4 08/13/2015   GFRNONAA >60 04/03/2016   GFRAA >60 04/03/2016    Lab Results  Component Value Date   WBC 6.3 11/16/2016   NEUTROABS 5.3 11/16/2016   HGB 13.2 11/16/2016   HCT 40.2 11/16/2016   MCV 88.8 11/16/2016   PLT 307 11/16/2016   Lab Results  Component Value Date   IRON 39 07/13/2016   TIBC 408 07/13/2016   IRONPCTSAT 10 (L) 07/13/2016   Lab Results  Component Value Date   FERRITIN 11 07/13/2016     STUDIES: Dg C-arm 1-60 Min-no Report  Result Date: 11/26/2016 Fluoroscopy was utilized by the requesting physician.  No  radiographic interpretation.    ASSESSMENT: Iron deficiency anemia.  PLAN:    1. Iron deficiency anemia:  Patient's hemoglobin has trended down and she has decreased iron stores. Previously, the remainder of her laboratory work was either negative or within normal limits. We will proceed with one infusion of 510 mg IV Feraheme today. Return to clinic in 3 months with repeat laboratory work and further evaluation.  2. Possible lupus: Continued evaluation and treatment per rheumatology. Patient is currently on a prednisone taper and has follow-up with rheumatology in the next 1-2 weeks.  Patient expressed understanding and was in agreement with this plan. She also understands that She can call clinic at any time with any questions, concerns, or complaints.    Lloyd Huger, MD   11/28/2016 11:06 PM

## 2016-11-30 ENCOUNTER — Ambulatory Visit: Payer: Medicare Other | Admitting: Oncology

## 2016-11-30 ENCOUNTER — Ambulatory Visit: Payer: Medicare Other

## 2016-11-30 ENCOUNTER — Inpatient Hospital Stay: Payer: Medicare Other

## 2016-11-30 ENCOUNTER — Inpatient Hospital Stay: Payer: Medicare Other | Admitting: Oncology

## 2017-01-15 ENCOUNTER — Telehealth: Payer: Self-pay | Admitting: Urology

## 2017-01-15 NOTE — Telephone Encounter (Signed)
Pt has a knot on the top of her incision from left to right and wants to know if she should be concerned.  MacDiarmid did surgery on pt and she wants to know if she should be seen sooner.  Please give pt a call.

## 2017-01-18 ENCOUNTER — Ambulatory Visit: Payer: Medicare Other

## 2017-01-18 NOTE — Telephone Encounter (Signed)
Spoke with patient and told her this is most likely normal healing from surgery but she could come in for a nurse visit to examine the site to make sure there was no sign of infection

## 2017-01-27 ENCOUNTER — Encounter: Payer: Self-pay | Admitting: Emergency Medicine

## 2017-01-27 ENCOUNTER — Emergency Department
Admission: EM | Admit: 2017-01-27 | Discharge: 2017-01-27 | Disposition: A | Discharge status: Home / Self Care | Payer: Medicare Other | Attending: Emergency Medicine | Admitting: Emergency Medicine

## 2017-01-27 DIAGNOSIS — Z96651 Presence of right artificial knee joint: Secondary | ICD-10-CM | POA: Diagnosis not present

## 2017-01-27 DIAGNOSIS — I130 Hypertensive heart and chronic kidney disease with heart failure and stage 1 through stage 4 chronic kidney disease, or unspecified chronic kidney disease: Secondary | ICD-10-CM | POA: Insufficient documentation

## 2017-01-27 DIAGNOSIS — I5032 Chronic diastolic (congestive) heart failure: Secondary | ICD-10-CM | POA: Diagnosis not present

## 2017-01-27 DIAGNOSIS — N189 Chronic kidney disease, unspecified: Secondary | ICD-10-CM | POA: Insufficient documentation

## 2017-01-27 DIAGNOSIS — Z87891 Personal history of nicotine dependence: Secondary | ICD-10-CM | POA: Diagnosis not present

## 2017-01-27 DIAGNOSIS — J029 Acute pharyngitis, unspecified: Secondary | ICD-10-CM

## 2017-01-27 DIAGNOSIS — Z79899 Other long term (current) drug therapy: Secondary | ICD-10-CM | POA: Diagnosis not present

## 2017-01-27 DIAGNOSIS — E039 Hypothyroidism, unspecified: Secondary | ICD-10-CM | POA: Diagnosis not present

## 2017-01-27 LAB — POCT RAPID STREP A: STREPTOCOCCUS, GROUP A SCREEN (DIRECT): NEGATIVE

## 2017-01-27 MED ORDER — BENZOCAINE-MENTHOL 6-10 MG MT LOZG: 1.0000 | LOZENGE | OROMUCOSAL | 0 refills | Status: AC | PRN

## 2017-01-27 MED ORDER — AMOXICILLIN 500 MG PO TABS: 500.0000 mg | ORAL_TABLET | Freq: Two times a day (BID) | ORAL | 0 refills | Status: DC

## 2017-01-27 NOTE — Discharge Instructions (Signed)
Take medication as prescribed. Follow-up with your PCP as needed or if symptom worsen return to the emergency department.

## 2017-01-27 NOTE — ED Notes (Signed)
Pt verbalized understanding of discharge instructions. NAD at this time. 

## 2017-01-27 NOTE — ED Provider Notes (Signed)
[facility] Emergency Department Provider Note   ____________________________________________   I have reviewed the triage vital signs and the nursing notes.   HISTORY  Chief Complaint Sore Throat    HPI Alexis Christensen is a 46 y.o. female since emergency department with a three-day history of sore throat with mild difficulty swallowing. Patient does endorse left ear pain she describes as pressure associated with sore throat. She denies cough, congestion, fever, headache, nausea or vomiting. Patient denies any sick contacts with similar symptoms. Patient denies vision changes, chest pain, chest tightness, shortness of breath, or abdominal pain.  Past Medical History:  Diagnosis Date  . Adopted   . Anemia   . Anxiety   . Arthritis   . Bipolar 1 disorder ([place])   . CHF (congestive heart failure) (HCC)    Dialostic CHF  . Chronic kidney disease    H/O KIDNEY STONES  . Depression   . DVT (deep venous thrombosis) ([place]) 2016   RIGHT LEG  . Edema leg   . Effusion of knee 12/30/2013  . Family history of adverse reaction to anesthesia    ADOPTED  . GERD (gastroesophageal reflux disease)   . Headache(784.0)    MIGRAINES  . Heart burn   . Heart murmur   . History of kidney stones   . HLD (hyperlipidemia)   . Hx MRSA infection   . Hypothyroidism   . Lower extremity edema   . Post traumatic stress disorder (PTSD)   . PVD (peripheral vascular disease) ([place])   . Seasonal allergies   . Stress incontinence   . Thyroid disease   . Urge incontinence     Patient Active Problem List   Diagnosis Date Noted  . Iron deficiency anemia 07/11/2016  . Lymphedema 04/26/2016  . Chronic venous insufficiency 04/26/2016  . Swelling of limb 04/26/2016  . Chronic diarrhea 09/07/2015  . Fever 09/07/2015  . Hypothyroidism 09/07/2015  . Hypovitaminosis D 09/07/2015  . Obesity 09/07/2015  . Weight loss, unintentional 09/07/2015  . Bipolar mixed affective disorder,  moderate ([place]) 09/05/2015  . SUI (stress urinary incontinence, female) 05/18/2015  . Pain in shoulder 09/15/2014  . Carpal tunnel syndrome 09/15/2014  . Effusion of knee 12/30/2013  . 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 Gonalgia 12/30/2013  . Lipoma of arm 05/11/2013    Past Surgical History:  Procedure Laterality Date  . ABDOMINAL HYSTERECTOMY    . ANKLE SURGERY    . CHOLECYSTECTOMY    . CYSTOSCOPY N/A 04/09/2016   Procedure: CYSTOSCOPY;  Surgeon: Bjorn Loser, MD;  Location: [facility] ORS;  Service: Urology;  Laterality: N/A;  . DILATION AND CURETTAGE OF UTERUS    . INTERSTIM IMPLANT PLACEMENT N/A 11/26/2016   Procedure: Barrie Lyme IMPLANT FIRST STAGE;  Surgeon: Bjorn Loser, MD;  Location: [facility] ORS;  Service: Urology;  Laterality: N/A;  . INTERSTIM IMPLANT PLACEMENT N/A 11/26/2016   Procedure: Barrie Lyme IMPLANT SECOND STAGE;  Surgeon: Bjorn Loser, MD;  Location: [facility] ORS;  Service: Urology;  Laterality: N/A;  . JOINT REPLACEMENT Right    knee  . KNEE ARTHROSCOPY Bilateral   . KNEE ARTHROSCOPY WITH LATERAL RELEASE Left 10/18/2015   Procedure: KNEE ARTHROSCOPY LATERAL AND PARTIAL SYNOVECTOMY;  Surgeon: Hessie Knows, MD;  Location: [facility] ORS;  Service: Orthopedics;  Laterality: Left;  . Lymph Node removal  2015   Neck  . PUBOVAGINAL SLING N/A 04/09/2016   Procedure: PUBO-VAGINAL SLING/ RETROPUBIC SLING;  Surgeon: Bjorn Loser, MD;  Location: [facility] ORS;  Service:  Urology;  Laterality: N/A;  . REPLACEMENT TOTAL KNEE Right   . SHOULDER SURGERY Right 2014  . TUBAL LIGATION    . WRIST SURGERY Right    metal plate    Prior to Admission medications   Medication Sig Start Date End Date Taking? Authorizing Provider  amoxicillin (AMOXIL) 500 MG tablet Take 1 tablet (500 mg total) by mouth 2 (two) times daily. 01/27/17   Krissie Merrick M, PA-C  ARIPiprazole (ABILIFY MAINTENA IM) Inject 400 mg into the muscle every 30 (thirty) days.    [provider]  atorvastatin  (LIPITOR) 40 MG tablet Take 40 mg by mouth daily at 6 PM.    [provider]  benzocaine-menthol (CHLORAEPTIC) 6-10 MG lozenge Take 1 lozenge by mouth as needed for sore throat. 01/27/17 02/02/17  Junah Yam M, PA-C  clobetasol cream (TEMOVATE) 7.32 % Apply 1 application topically 2 (two) times daily as needed (RASH).    [provider]  colestipol (COLESTID) 1 g tablet Take 2 g by mouth daily at 12 noon.     [provider]  DULoxetine (CYMBALTA) 60 MG capsule Take 60 mg by mouth at bedtime.     [provider]  fluticasone (FLONASE) 50 MCG/ACT nasal spray Place 1 spray into both nostrils daily.    [provider]  gabapentin (NEURONTIN) 600 MG tablet Take 1,800 mg by mouth at bedtime. Patient stated she takes three 600  mg tablets at bedtime.    [provider]  hydrOXYzine (ATARAX/VISTARIL) 25 MG tablet Take 50 mg by mouth every 8 (eight) hours as needed for itching.    [provider]  hyoscyamine (LEVSIN, ANASPAZ) 0.125 MG tablet Take 0.125 mg by mouth every 4 (four) hours as needed for bladder spasms.     [provider]  levocetirizine (XYZAL) 5 MG tablet Take 5 mg by mouth every evening.    [provider]  levothyroxine (SYNTHROID, LEVOTHROID) 150 MCG tablet Take 150 mcg by mouth daily before breakfast.  02/24/16   [provider]  meloxicam (MOBIC) 7.5 MG tablet Take 7.5 mg by mouth daily as needed for pain.     [provider]  methocarbamol (ROBAXIN) 500 MG tablet Take 500 mg by mouth every 8 (eight) hours as needed for muscle spasms.     [provider]  ranitidine (ZANTAC) 150 MG tablet Take 150 mg by mouth 2 (two) times daily.    [provider]  SUBOXONE 8-2 MG FILM DISSOLVE ONE AND HALF  FILMS UNDER THE TONGUE ONCE DAILY 09/14/16   [provider]    Allergies Geodon [ziprasidone hydrochloride]; Ziprasidone hcl; Sulfa antibiotics; Ace inhibitors; Erythromycin;  Hydromorphone; Lamictal [lamotrigine]; and Strawberry (diagnostic)  Family History  Problem Relation Age of Onset  . Adopted: Yes  . Family history unknown: Yes    Social History Social History  Substance Use Topics  . Smoking status: Former Smoker    Packs/day: 0.25    Years: 20.00    Types: Cigarettes    Quit date: 11/13/2012  . Smokeless tobacco: Never Used     Comment: STRESS RELATED  . Alcohol use 0.0 oz/week     Comment: occasional    Review of Systems Constitutional: Negative for fever/chills Eyes: No visual changes. ENT:  Positive for sore throat and mild difficulty swallowing Cardiovascular: Denies chest pain. Respiratory: Denies cough. Denies shortness of breath. Gastrointestinal: No abdominal pain.  No nausea, vomiting, diarrhea. Musculoskeletal: Negative for back pain. Skin: Negative for rash.  Neurological: Negative for headaches. ____________________________________________   PHYSICAL EXAM:  VITAL SIGNS: ED Triage Vitals [01/27/17 1622]  Enc Vitals Group     BP 110/65     Pulse Rate 74     Resp 18     Temp 98.7 F (37.1 C)     Temp Source Oral     SpO2 97 %     Weight 180 lb (81.6 kg)     Height 5\' 4"  (1.626 m)     Head Circumference      Peak Flow      Pain Score      Pain Loc      Pain Edu?      Excl. in [place]?     Constitutional: Alert and oriented. Well appearing and in no acute distress.  Head: Normocephalic and atraumatic. Eyes: Conjunctivae are normal. PERRL.  Ears: Canals clear. TMs intact bilaterally. Nose: No congestion/rhinorrhea Mouth/Throat: Mucous membranes are moist. Oropharynx erythematous. Tonsils symmetrical bilaterally. Neck: Supple.  Hematological/Lymphatic/Immunological: right submandibular lymphadenopathy. Cardiovascular: Normal rate, regular rhythm. Normal distal pulses. Respiratory: Normal respiratory effort. No wheezes/rales/rhonchi. Lungs CTAB Gastrointestinal: Soft and nontender. No distention. Musculoskeletal:  Nontender with normal range of motion in all extremities. Neurologic: Normal speech and language. No gross focal neurologic deficits are appreciated. No gait instability. Cranial nerves: II-X intact. No sensory loss or abnormal reflexes.  Skin:  Skin is warm, dry and intact. No rash noted. Psychiatric: Mood and affect are normal.  ____________________________________________   LABS (all labs ordered are listed, but only abnormal results are displayed)  Labs Reviewed  POCT RAPID STREP A   ____________________________________________  EKG none ____________________________________________  RADIOLOGY none ____________________________________________   PROCEDURES  Procedure(s) performed: no    Critical Care performed: no ____________________________________________   INITIAL IMPRESSION / ASSESSMENT AND PLAN / ED COURSE  Pertinent labs & imaging results that were available during my care of the patient were reviewed by me and considered in my medical decision making (see chart for details).  Patient presents to the emergency department with sore throat and mild difficulty swallowing. History, physical exam and POC strep result negative are reassuring symptoms are consistent with acute phyarigitis. Patient will be prescribed amoxicillin for antibiotic coverage and throat lozenges for throat pain symptom relief. Patient will also be encourage to rest, and rehydrate as a part of supportive care. Physical exam is reassuring at this time. Patient informed of clinical course, understand medical decision-making process, and agree with plan. Patient was advised to follow up with PCP as needed and was also advised to return to the emergency department for symptoms that change or worsen.      ____________________________________________   FINAL CLINICAL IMPRESSION(S) / ED DIAGNOSES  Final diagnoses:  Acute pharyngitis, unspecified etiology       NEW MEDICATIONS STARTED DURING  THIS VISIT:  Discharge Medication List as of 01/27/2017  5:58 PM    START taking these medications   Details  amoxicillin (AMOXIL) 500 MG tablet Take 1 tablet (500 mg total) by mouth 2 (two) times daily., Starting Sun 01/27/2017, Print    benzocaine-menthol (CHLORAEPTIC) 6-10 MG lozenge Take 1 lozenge by mouth as needed for sore throat., Starting Sun 01/27/2017, Until Sat 02/02/2017, Print         Note:  This document was prepared using Dragon voice recognition software and may include unintentional dictation errors.   Alric Quan 01/27/17 1830    Schaevitz, Randall An, MD 01/31/17 2144

## 2017-01-27 NOTE — ED Triage Notes (Signed)
Pt c/o sore throat x 3 days, pain when swallowing, denies any fevers.

## 2017-03-22 ENCOUNTER — Ambulatory Visit
Admission: RE | Admit: 2017-03-22 | Discharge: 2017-03-22 | Disposition: A | Discharge status: Home / Self Care | Payer: Medicare Other | Source: Ambulatory Visit | Attending: Otolaryngology | Admitting: Otolaryngology

## 2017-03-22 ENCOUNTER — Emergency Department
Admission: EM | Admit: 2017-03-22 | Discharge: 2017-03-22 | Disposition: A | Discharge status: Home / Self Care | Payer: Medicare Other | Attending: Emergency Medicine | Admitting: Emergency Medicine

## 2017-03-22 ENCOUNTER — Encounter: Payer: Self-pay | Admitting: Emergency Medicine

## 2017-03-22 ENCOUNTER — Emergency Department: Payer: Medicare Other

## 2017-03-22 DIAGNOSIS — N3000 Acute cystitis without hematuria: Secondary | ICD-10-CM | POA: Diagnosis not present

## 2017-03-22 DIAGNOSIS — N189 Chronic kidney disease, unspecified: Secondary | ICD-10-CM | POA: Diagnosis not present

## 2017-03-22 DIAGNOSIS — E039 Hypothyroidism, unspecified: Secondary | ICD-10-CM | POA: Diagnosis not present

## 2017-03-22 DIAGNOSIS — E042 Nontoxic multinodular goiter: Secondary | ICD-10-CM | POA: Diagnosis not present

## 2017-03-22 DIAGNOSIS — R102 Pelvic and perineal pain: Secondary | ICD-10-CM | POA: Insufficient documentation

## 2017-03-22 DIAGNOSIS — E041 Nontoxic single thyroid nodule: Secondary | ICD-10-CM

## 2017-03-22 DIAGNOSIS — F17210 Nicotine dependence, cigarettes, uncomplicated: Secondary | ICD-10-CM | POA: Diagnosis not present

## 2017-03-22 DIAGNOSIS — I5032 Chronic diastolic (congestive) heart failure: Secondary | ICD-10-CM | POA: Diagnosis not present

## 2017-03-22 LAB — COMPREHENSIVE METABOLIC PANEL
ALK PHOS: 66 U/L (ref 38–126)
ALT: 24 U/L (ref 14–54)
AST: 27 U/L (ref 15–41)
Albumin: 4.1 g/dL (ref 3.5–5.0)
Anion gap: 8 (ref 5–15)
BILIRUBIN TOTAL: 0.6 mg/dL (ref 0.3–1.2)
BUN: 14 mg/dL (ref 6–20)
CALCIUM: 9.4 mg/dL (ref 8.9–10.3)
CO2: 27 mmol/L (ref 22–32)
Chloride: 105 mmol/L (ref 101–111)
Creatinine, Ser: 0.65 mg/dL (ref 0.44–1.00)
GFR calc Af Amer: >60 mL/min (ref >60)
Glucose, Bld: 150 mg/dL — ABNORMAL HIGH (ref 65–99)
Potassium: 4.2 mmol/L (ref 3.5–5.1)
Sodium: 140 mmol/L (ref 135–145)
TOTAL PROTEIN: 7.5 g/dL (ref 6.5–8.1)

## 2017-03-22 LAB — CBC
HCT: 37.8 % (ref 35.0–47.0)
HEMOGLOBIN: 12.8 g/dL (ref 12.0–16.0)
MCH: 30.1 pg (ref 26.0–34.0)
MCHC: 33.9 g/dL (ref 32.0–36.0)
MCV: 88.7 fL (ref 80.0–100.0)
Platelets: 256 10*3/uL (ref 150–440)
RBC: 4.26 MIL/uL (ref 3.80–5.20)
RDW: 13.6 % (ref 11.5–14.5)
WBC: 7 10*3/uL (ref 3.6–11.0)

## 2017-03-22 LAB — LIPASE, BLOOD: Lipase: 17 U/L (ref 11–51)

## 2017-03-22 LAB — URINALYSIS, COMPLETE (UACMP) WITH MICROSCOPIC
Bilirubin Urine: NEGATIVE
GLUCOSE, UA: 50 mg/dL — AB
HGB URINE DIPSTICK: NEGATIVE
Ketones, ur: NEGATIVE mg/dL
NITRITE: NEGATIVE
Protein, ur: NEGATIVE mg/dL
Specific Gravity, Urine: 1.026 (ref 1.005–1.030)
pH: 6 (ref 5.0–8.0)

## 2017-03-22 MED ORDER — TRAMADOL HCL 50 MG PO TABS: 50.0000 mg | ORAL_TABLET | Freq: Four times a day (QID) | ORAL | 0 refills | Status: AC | PRN

## 2017-03-22 MED ORDER — OXYCODONE-ACETAMINOPHEN 5-325 MG PO TABS
2.0000 | ORAL_TABLET | Freq: Once | ORAL | Status: AC
  Administered 2017-03-22: 2 via ORAL
  Filled 2017-03-22: qty 2

## 2017-03-22 MED ORDER — CIPROFLOXACIN HCL 250 MG PO TABS: 250.0000 mg | ORAL_TABLET | Freq: Two times a day (BID) | ORAL | 0 refills | Status: AC

## 2017-03-22 NOTE — ED Triage Notes (Signed)
Pt to ED from home c/o right and left lower abd pain that started suddenly this morning around 0400.  States nausea but no vomiting or diarrhea.  Denies urinary symptoms.

## 2017-03-22 NOTE — ED Provider Notes (Signed)
[facility] Emergency Department Provider Note       Time seen: ----------------------------------------- 5:29 PM on 03/22/2017 -----------------------------------------     I have reviewed the triage vital signs and the nursing notes.   HISTORY   Chief Complaint Abdominal Pain    HPI Alexis Christensen is a 46 y.o. female who presents to the ED for right and left lower abdominal pain that started suddenly this morning around 4 AM. Patient states pain starts in the right side of her pelvis and radiates to the left side. She has a previous hysterectomy but still has her ovaries. She's had nausea but no vomiting or diarrhea.currently the pain is 9 out of 10 in the pelvis.   Past Medical History:  Diagnosis Date  . Adopted   . Anemia   . Anxiety   . Arthritis   . Bipolar 1 disorder ([place])   . CHF (congestive heart failure) (HCC)    Dialostic CHF  . Chronic kidney disease    H/O KIDNEY STONES  . Depression   . DVT (deep venous thrombosis) ([place]) 2016   RIGHT LEG  . Edema leg   . Effusion of knee 12/30/2013  . Family history of adverse reaction to anesthesia    ADOPTED  . GERD (gastroesophageal reflux disease)   . Headache(784.0)    MIGRAINES  . Heart burn   . Heart murmur   . History of kidney stones   . HLD (hyperlipidemia)   . Hx MRSA infection   . Hypothyroidism   . Lower extremity edema   . Post traumatic stress disorder (PTSD)   . PVD (peripheral vascular disease) ([place])   . Seasonal allergies   . Stress incontinence   . Thyroid disease   . Urge incontinence     Patient Active Problem List   Diagnosis Date Noted  . Iron deficiency anemia 07/11/2016  . Lymphedema 04/26/2016  . Chronic venous insufficiency 04/26/2016  . Swelling of limb 04/26/2016  . Chronic diarrhea 09/07/2015  . Fever 09/07/2015  . Hypothyroidism 09/07/2015  . Hypovitaminosis D 09/07/2015  . Obesity 09/07/2015  . Weight loss, unintentional 09/07/2015  . Bipolar  mixed affective disorder, moderate ([place]) 09/05/2015  . SUI (stress urinary incontinence, female) 05/18/2015  . Pain in shoulder 09/15/2014  . Carpal tunnel syndrome 09/15/2014  . Effusion of knee 12/30/2013  . 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 Gonalgia 12/30/2013  . Lipoma of arm 05/11/2013    Past Surgical History:  Procedure Laterality Date  . ABDOMINAL HYSTERECTOMY    . ANKLE SURGERY    . CHOLECYSTECTOMY    . CYSTOSCOPY N/A 04/09/2016   Procedure: CYSTOSCOPY;  Surgeon: Bjorn Loser, MD;  Location: [facility] ORS;  Service: Urology;  Laterality: N/A;  . DILATION AND CURETTAGE OF UTERUS    . INTERSTIM IMPLANT PLACEMENT N/A 11/26/2016   Procedure: Barrie Lyme IMPLANT FIRST STAGE;  Surgeon: Bjorn Loser, MD;  Location: [facility] OR[facility];  Service: Urology;  Laterality: N/A;  . INTERSTIM IMPLANT PLACEMENT N/A 11/26/2016   Procedure: Barrie Lyme IMPLANT SECOND STAGE;  Surgeon: Bjorn Loser, MD;  Location: [facility];  Service: Urology;  Laterality: N/A;  . JOINT REPLACEMENT Right    knee  . KNEE ARTHROSCOPY Bilateral   . KNEE ARTHROSCOPY WITH LATERAL RELEASE Left 10/18/2015   Procedure: KNEE ARTHROSCOPY LATERAL AND PARTIAL SYNOVECTOMY;  Surgeon: Hessie Knows, MD;  Location: [facility];  Service: Orthopedics;  Laterality: Left;  . Lymph Node removal  2015   Neck  . PUBOVAGINAL SLING  N/A 04/09/2016   Procedure: PUBO-VAGINAL SLING/ RETROPUBIC SLING;  Surgeon: Bjorn Loser, MD;  Location: [facility] OR[facility];  Service: Urology;  Laterality: N/A;  . REPLACEMENT TOTAL KNEE Right   . SHOULDER SURGERY Right 2014  . TUBAL LIGATION    . WRIST SURGERY Right    metal plate    Allergies Geodon [ziprasidone hydrochloride]; Ziprasidone hcl; Sulfa antibiotics; Ace inhibitors; Erythromycin; Hydromorphone; Lamictal [lamotrigine]; and Strawberry (diagnostic)  Social History Social History  Substance Use Topics  . Smoking status: Current Some Day Smoker    Packs/day: 0.25    Years: 20.00    Types:  Cigarettes    Last attempt to quit: 11/13/2012  . Smokeless tobacco: Never Used     Comment: STRESS RELATED  . Alcohol use 0.0 oz/week     Comment: occasional    Review of Systems Constitutional: Negative for fever. Eyes: Negative for vision changes ENT:  Negative for congestion, sore throat Cardiovascular: Negative for chest pain. Respiratory: Negative for shortness of breath. Gastrointestinal: positive for pelvic pain Genitourinary: Negative for dysuria. Musculoskeletal: Negative for back pain. Skin: Negative for rash. Neurological: Negative for headaches, focal weakness or numbness.  All systems negative/normal/unremarkable except as stated in the HPI  ____________________________________________   PHYSICAL EXAM:  VITAL SIGNS: ED Triage Vitals  Enc Vitals Group     BP 03/22/17 1654 127/68     Pulse Rate 03/22/17 1654 66     Resp 03/22/17 1654 16     Temp 03/22/17 1654 98.3 F (36.8 C)     Temp src --      SpO2 03/22/17 1654 98 %     Weight 03/22/17 1654 180 lb (81.6 kg)     Height 03/22/17 1654 5\' 7"  (1.702 m)     Head Circumference --      Peak Flow --      Pain Score 03/22/17 1653 9     Pain Loc --      Pain Edu? --      Excl. in [place]? --    Constitutional: Alert and oriented. Well appearing and in no distress. Eyes: Conjunctivae are normal. Normal extraocular movements. ENT   Head: Normocephalic and atraumatic.   Nose: No congestion/rhinnorhea.   Mouth/Throat: Mucous membranes are moist.   Neck: No stridor. Cardiovascular: Normal rate, regular rhythm. No murmurs, rubs, or gallops. Respiratory: Normal respiratory effort without tachypnea nor retractions. Breath sounds are clear and equal bilaterally. No wheezes/rales/rhonchi. Gastrointestinal: right-sided pelvic tenderness, no rebound or guarding. Normal bowel sounds. Musculoskeletal: Nontender with normal range of motion in extremities. No lower extremity tenderness nor edema. Neurologic:  Normal  speech and language. No gross focal neurologic deficits are appreciated.  Skin:  Skin is warm, dry and intact. No rash noted. Psychiatric: Mood and affect are normal. Speech and behavior are normal.  ____________________________________________  ED COURSE:  Pertinent labs & imaging results that were available during my care of the patient were reviewed by me and considered in my medical decision making (see chart for details). Patient presents for pelvic pain, we will assess with labs and imaging as indicated.   Procedures ____________________________________________   LABS (pertinent positives/negatives)  Labs Reviewed  COMPREHENSIVE METABOLIC PANEL - Abnormal; Notable for the following:       Result Value   Glucose, Bld 150 (*)    All other components within normal limits  URINALYSIS, COMPLETE (UACMP) WITH MICROSCOPIC - Abnormal; Notable for the following:    Color, Urine YELLOW (*)    APPearance CLOUDY (*)  Glucose, UA 50 (*)    Leukocytes, UA TRACE (*)    Bacteria, UA RARE (*)    Squamous Epithelial / LPF 6-30 (*)    All other components within normal limits  LIPASE, BLOOD  CBC    RADIOLOGY  pelvic ultrasound IMPRESSION: Normal sonographic appearance of the ovaries bilaterally.  No evidence to suggest torsion.  No acute process in the pelvis. ____________________________________________  FINAL ASSESSMENT AND PLAN  pelvic pain  Plan: Patient's labs and imaging were dictated above. Patient had presented for uncertain etiology for her pelvic pain. She'll be discharged with close outpatient follow-up.   Earleen Newport, MD   Note: This note was generated in part or whole with voice recognition software. Voice recognition is usually quite accurate but there are transcription errors that can and very often do occur. I apologize for any typographical errors that were not detected and corrected.     Earleen Newport, MD 03/22/17 Einar Crow

## 2017-03-25 ENCOUNTER — Other Ambulatory Visit: Payer: Self-pay | Admitting: Nurse Practitioner

## 2017-03-25 ENCOUNTER — Ambulatory Visit
Admission: RE | Admit: 2017-03-25 | Discharge: 2017-03-25 | Disposition: A | Discharge status: Home / Self Care | Payer: Medicare Other | Source: Ambulatory Visit | Attending: Nurse Practitioner | Admitting: Nurse Practitioner

## 2017-03-25 DIAGNOSIS — M545 Low back pain, unspecified: Secondary | ICD-10-CM

## 2017-03-25 DIAGNOSIS — K449 Diaphragmatic hernia without obstruction or gangrene: Secondary | ICD-10-CM | POA: Insufficient documentation

## 2017-03-25 DIAGNOSIS — K5790 Diverticulosis of intestine, part unspecified, without perforation or abscess without bleeding: Secondary | ICD-10-CM | POA: Insufficient documentation

## 2017-03-25 DIAGNOSIS — K4090 Unilateral inguinal hernia, without obstruction or gangrene, not specified as recurrent: Secondary | ICD-10-CM | POA: Diagnosis not present

## 2017-03-25 DIAGNOSIS — R1084 Generalized abdominal pain: Secondary | ICD-10-CM

## 2017-03-25 DIAGNOSIS — R109 Unspecified abdominal pain: Secondary | ICD-10-CM

## 2017-03-25 MED ORDER — IOPAMIDOL (ISOVUE-300) INJECTION 61%
100.0000 mL | Freq: Once | INTRAVENOUS | Status: AC | PRN
  Administered 2017-03-25: 100 mL via INTRAVENOUS

## 2017-03-28 ENCOUNTER — Ambulatory Visit
Admission: RE | Admit: 2017-03-28 | Discharge: 2017-03-28 | Disposition: A | Discharge status: Home / Self Care | Payer: Medicare Other | Source: Ambulatory Visit | Attending: Nurse Practitioner | Admitting: Nurse Practitioner

## 2017-03-28 DIAGNOSIS — M545 Low back pain: Secondary | ICD-10-CM | POA: Diagnosis not present

## 2017-04-04 ENCOUNTER — Ambulatory Visit (INDEPENDENT_AMBULATORY_CARE_PROVIDER_SITE_OTHER): Payer: Medicare Other | Admitting: Surgery

## 2017-04-04 ENCOUNTER — Encounter: Payer: Self-pay | Admitting: Surgery

## 2017-04-04 VITALS — BP 100/69 | HR 67 | Temp 97.8°F | Ht 67.0 in | Wt 181.2 lb

## 2017-04-04 DIAGNOSIS — K4020 Bilateral inguinal hernia, without obstruction or gangrene, not specified as recurrent: Secondary | ICD-10-CM

## 2017-04-04 NOTE — Patient Instructions (Signed)
You have chose to have your hernia repaired. This will be done by Dr. Dahlia Byes on 04/18/17 at [facility].  Please see your (blue) Pre-care information that you have been given today.  You will need to arrange to be out of work for 2 weeks and then return with a lifting restrictions for 4 more weeks. Please send any FMLA paperwork prior to surgery and we will fill this out and fax it back to your employer within 3 business days.  You may have a bruise in your groin and also swelling and brusing in your testicle area. You may use ice 4-5 times daily for 15-20 minutes each time. Make sure that you place a barrier between you and the ice pack. To decrease the swelling, you may roll up a bath towel and place it vertically in between your thighs with your testicles resting on the towel. You will want to keep this area elevated as much as possible for several days following surgery.    Inguinal Hernia, Adult Muscles help keep everything in the body in its proper place. But if a weak spot in the muscles develops, something can poke through. That is called a hernia. When this happens in the lower part of the belly (abdomen), it is called an inguinal hernia. (It takes its name from a part of the body in this region called the inguinal canal.) A weak spot in the wall of muscles lets some fat or part of the small intestine bulge through. An inguinal hernia can develop at any age. Men get them more often than women. CAUSES  In adults, an inguinal hernia develops over time.  It can be triggered by:  Suddenly straining the muscles of the lower abdomen.  Lifting heavy objects.  Straining to have a bowel movement. Difficult bowel movements (constipation) can lead to this.  Constant coughing. This may be caused by smoking or lung disease.  Being overweight.  Being pregnant.  Working at a job that requires long periods of standing or heavy lifting.  Having had an inguinal hernia before. One type can be an  emergency situation. It is called a strangulated inguinal hernia. It develops if part of the small intestine slips through the weak spot and cannot get back into the abdomen. The blood supply can be cut off. If that happens, part of the intestine may die. This situation requires emergency surgery. SYMPTOMS  Often, a small inguinal hernia has no symptoms. It is found when a healthcare provider does a physical exam. Larger hernias usually have symptoms.   In adults, symptoms may include:  A lump in the groin. This is easier to see when the person is standing. It might disappear when lying down.  In men, a lump in the scrotum.  Pain or burning in the groin. This occurs especially when lifting, straining or coughing.  A dull ache or feeling of pressure in the groin.  Signs of a strangulated hernia can include:  A bulge in the groin that becomes very painful and tender to the touch.  A bulge that turns red or purple.  Fever, nausea and vomiting.  Inability to have a bowel movement or to pass gas. DIAGNOSIS  To decide if you have an inguinal hernia, a healthcare provider will probably do a physical examination.  This will include asking questions about any symptoms you have noticed.  The healthcare provider might feel the groin area and ask you to cough. If an inguinal hernia is felt, the healthcare provider  may try to slide it back into the abdomen.  Usually no other tests are needed. TREATMENT  Treatments can vary. The size of the hernia makes a difference. Options include:  Watchful waiting. This is often suggested if the hernia is small and you have had no symptoms.  No medical procedure will be done unless symptoms develop.  You will need to watch closely for symptoms. If any occur, contact your healthcare provider right away.  Surgery. This is used if the hernia is larger or you have symptoms.  Open surgery. This is usually an outpatient procedure (you will not stay  overnight in a hospital). An cut (incision) is made through the skin in the groin. The hernia is put back inside the abdomen. The weak area in the muscles is then repaired by herniorrhaphy or hernioplasty. Herniorrhaphy: in this type of surgery, the weak muscles are sewn back together. Hernioplasty: a patch or mesh is used to close the weak area in the abdominal wall.  Laparoscopy. In this procedure, a surgeon makes small incisions. A thin tube with a tiny video camera (called a laparoscope) is put into the abdomen. The surgeon repairs the hernia with mesh by looking with the video camera and using two long instruments. HOME CARE INSTRUCTIONS   After surgery to repair an inguinal hernia:  You will need to take pain medicine prescribed by your healthcare provider. Follow all directions carefully.  You will need to take care of the wound from the incision.  Your activity will be restricted for awhile. This will probably include no heavy lifting for several weeks. You also should not do anything too active for a few weeks. When you can return to work will depend on the type of job that you have.  During "watchful waiting" periods, you should:  Maintain a healthy weight.  Eat a diet high in fiber (fruits, vegetables and whole grains).  Drink plenty of fluids to avoid constipation. This means drinking enough water and other liquids to keep your urine clear or pale yellow.  Do not lift heavy objects.  Do not stand for long periods of time.  Quit smoking. This should keep you from developing a frequent cough. SEEK MEDICAL CARE IF:   A bulge develops in your groin area.  You feel pain, a burning sensation or pressure in the groin. This might be worse if you are lifting or straining.  You develop a fever of more than 100.5 F (38.1 C). SEEK IMMEDIATE MEDICAL CARE IF:   Pain in the groin increases suddenly.  A bulge in the groin gets bigger suddenly and does not go down.  For men, there  is sudden pain in the scrotum. Or, the size of the scrotum increases.  A bulge in the groin area becomes red or purple and is painful to touch.  You have nausea or vomiting that does not go away.  You feel your heart beating much faster than normal.  You cannot have a bowel movement or pass gas.  You develop a fever of more than 102.0 F (38.9 C).   This information is not intended to replace advice given to you by your health care provider. Make sure you discuss any questions you have with your health care provider.   Document Released: 11/18/2008 Document Revised: 09/24/2011 Document Reviewed: 01/03/2015 Elsevier Interactive Patient Education [employer].

## 2017-04-05 ENCOUNTER — Telehealth: Payer: Self-pay | Admitting: Surgery

## 2017-04-05 NOTE — Telephone Encounter (Signed)
Pt advised of pre op date/time and sx date. Sx: 04/18/17 with Dr Pabon--Laparoscopic bilateral inguinal hernia repair.  Pre op: 04/09/17 @ 1:00pm--in office.   Patient made aware to call [phone], between 1-3:00pm the day before surgery, to find out what time to arrive.

## 2017-04-05 NOTE — Progress Notes (Signed)
Surgical Consultation  04/05/2017  Alexis Christensen is an 46 y.o. female.   Chief Complaint  Patient presents with  . New Patient (Initial Visit)    Bilateral inguinal hernia repair--referral from [facility]     HPI: Alexis Christensen is seen in consultation at the request of [name], NP. She stated over the last 2 weeks has onset of bilateral inguinal and lower abdominal pain. Pain is intermittent moderate in intensity and sharp. Upon is exacerbated with Valsalva maneuvers. No nausea no vomiting she does feel bulges in bilateral groins. She is able to perform more than 4 Mets of activity without any shortness of breath or chest pain. She has had a previous abdominal hysterectomy and a cholecystectomy. I have personally reviewed the CT scan that was performed 10 days ago showing bilateral inguinal hernias without incarceration. No other acute abnormalities. CBC was unremarkable as well as her creatinine.  Past Medical History:  Diagnosis Date  . Adopted   . Anemia   . Anxiety   . Arthritis   . Bipolar 1 disorder ([place])   . Carpal tunnel syndrome 09/15/2014  . CHF (congestive heart failure) ([facility])    Dialostic CHF  . Chronic diarrhea 09/07/2015  . Chronic kidney disease    H/O KIDNEY STONES  . Chronic venous insufficiency 04/26/2016  . Depression   . DVT (deep venous thrombosis) ([place]) 2016   RIGHT LEG  . Edema leg   . Effusion of knee 12/30/2013  . Family history of adverse reaction to anesthesia    ADOPTED  . GERD (gastroesophageal reflux disease)   . 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 Headache(784.0)    MIGRAINES  . Heart burn   . Heart murmur   . History of kidney stones   . HLD (hyperlipidemia)   . Hx MRSA infection   . Hypothyroidism   . Lipoma of arm 05/11/2013  . Lower extremity edema   . Lymphedema 04/26/2016  . Post traumatic stress disorder (PTSD)   . PVD (peripheral vascular disease) ([place])   . Seasonal allergies   . Stress incontinence   . Thyroid  disease   . Urge incontinence     Past Surgical History:  Procedure Laterality Date  . ABDOMINAL HYSTERECTOMY    . ANKLE SURGERY    . CHOLECYSTECTOMY    . CYSTOSCOPY N/A 04/09/2016   Procedure: CYSTOSCOPY;  Surgeon: Bjorn Loser, MD;  Location: [facility] ORS;  Service: Urology;  Laterality: N/A;  . DILATION AND CURETTAGE OF UTERUS    . INTERSTIM IMPLANT PLACEMENT N/A 11/26/2016   Procedure: Barrie Lyme IMPLANT FIRST STAGE;  Surgeon: Bjorn Loser, MD;  Location: [facility];  Service: Urology;  Laterality: N/A;  . INTERSTIM IMPLANT PLACEMENT N/A 11/26/2016   Procedure: Barrie Lyme IMPLANT SECOND STAGE;  Surgeon: Bjorn Loser, MD;  Location: [facility] OR[facility];  Service: Urology;  Laterality: N/A;  . JOINT REPLACEMENT Right    knee  . KNEE ARTHROSCOPY Bilateral   . KNEE ARTHROSCOPY WITH LATERAL RELEASE Left 10/18/2015   Procedure: KNEE ARTHROSCOPY LATERAL AND PARTIAL SYNOVECTOMY;  Surgeon: Hessie Knows, MD;  Location: [facility];  Service: Orthopedics;  Laterality: Left;  . Lymph Node removal  2015   Neck  . PUBOVAGINAL SLING N/A 04/09/2016   Procedure: PUBO-VAGINAL SLING/ RETROPUBIC SLING;  Surgeon: Bjorn Loser, MD;  Location: [facility] OR[facility];  Service: Urology;  Laterality: N/A;  . REPLACEMENT TOTAL KNEE Right   . SHOULDER SURGERY Right 2014  . TUBAL LIGATION    . WRIST SURGERY Right  metal plate    Family History  Problem Relation Age of Onset  . Adopted: Yes  . Family history unknown: Yes    Social History:  reports that she has been smoking Cigarettes.  She has a 5.00 pack-year smoking history. She has never used smokeless tobacco. She reports that she drinks alcohol. She reports that she does not use drugs.  Allergies:  Allergies  Allergen Reactions  . Geodon [Ziprasidone Hydrochloride] Other (See Comments)    Numbness ,sob, headaches, blurred vision  . Ziprasidone Hcl Anaphylaxis    Other reaction(s): Other (See Comments), Unknown Numbness ,sob, headaches, blurred vision Numbness  ,sob, headaches, blurred vision  . Sulfa Antibiotics Hives  . Ace Inhibitors Rash  . Erythromycin Rash and Hives  . Hydromorphone Rash  . Lamictal [Lamotrigine] Rash  . Strawberry (Diagnostic) Rash    Medications reviewed.     ROS Full ROS performed and is otherwise negative other than what is stated in the HPI    BP 100/69   Pulse 67   Temp 97.8 F (36.6 C) (Oral)   Ht 5\' 7"  (1.702 m)   Wt 82.2 kg (181 lb 3.2 oz)   BMI 28.38 kg/m   Physical Exam  Constitutional: She is oriented to person, place, and time and well-developed, well-nourished, and in no distress. No distress.  Eyes: Conjunctivae are normal. Right eye exhibits no discharge. Left eye exhibits no discharge. No scleral icterus.  Neck: Normal range of motion. No JVD present. No tracheal deviation present.  Cardiovascular: Normal rate and intact distal pulses.   Pulmonary/Chest: Effort normal. No stridor. No respiratory distress. She exhibits no tenderness.  Abdominal: Soft. She exhibits no distension and no mass. There is tenderness. There is no rebound and no guarding.  Bilateral reducible Inguinal hernias, Left tender to palpation  Neurological: She is alert and oriented to person, place, and time. Gait normal. GCS score is 15.  Skin: Skin is warm and dry. She is not diaphoretic.  Psychiatric: Mood, memory, affect and judgment normal.  Nursing note and vitals reviewed.    Assessment/Plan: 46 year old female with symptomatic bilateral inguinal hernias. Discussed with the patient in detail about her disease process and given that the hernias are symptomatic I deftly recommended surgical intervention. Discussed with the patient in detail about approaches and my preferred approach given the fact that the hernias are bilateral will be a laparoscopic preperitoneal approach. I discussed possibility of incarceration, strangulation, enlargement in size over time, and the risk of emergency surgery in the face of  strangulation.  Also discussed the risk of surgery including recurrence which can be up to 30% in the case of complex hernias, use of prosthetic materials (mesh) and the increased risk of infxn, post-op infxn and the possible need for re-operation and removal of mesh if used, possibility of post-op SBO or ileus, and the risks of general anesthetic including MI, CVA, sudden death or even reaction to anesthetic medications. The patient and family understands the risks, any and all questions were answered to the patient's satisfaction. Plan for elective laparoscopic bilateral inguinal hernia repair. All questions were answered , sent to the referral provider  Caroleen Hamman, MD [facility] General Surgeon

## 2017-04-08 ENCOUNTER — Encounter: Payer: Self-pay | Admitting: Physical Therapy

## 2017-04-08 ENCOUNTER — Ambulatory Visit: Payer: Medicare Other | Attending: Nurse Practitioner | Admitting: Physical Therapy

## 2017-04-08 DIAGNOSIS — M545 Low back pain: Secondary | ICD-10-CM | POA: Diagnosis present

## 2017-04-08 DIAGNOSIS — M6281 Muscle weakness (generalized): Secondary | ICD-10-CM | POA: Diagnosis present

## 2017-04-08 NOTE — Therapy (Addendum)
[place] MAIN [facility] SERVICES [address] [place], [place], [address] Phone: [phone]   Fax:  [phone]  Physical Therapy Evaluation  Patient Details  Name: KELEIGH KAZEE MRN: [id number] Date of Birth: August 23, 1970 Referring Provider: Danelle Berry  Encounter Date: 04/08/2017      PT End of Session - 04/08/17 1011    Visit Number 1   Number of Visits 17   Date for PT Re-Evaluation 06/03/17   PT Start Time 0910   PT Stop Time 0955   PT Time Calculation (min) 45 min   Activity Tolerance Patient tolerated treatment well   Behavior During Therapy [facility] for tasks assessed/performed      Past Medical History:  Diagnosis Date  . Adopted   . Anemia   . Anxiety   . Arthritis   . Bipolar 1 disorder ([place])   . Carpal tunnel syndrome 09/15/2014  . CHF (congestive heart failure) (HCC)    Dialostic CHF  . Chronic diarrhea 09/07/2015  . Chronic kidney disease    H/O KIDNEY STONES  . Chronic venous insufficiency 04/26/2016  . Depression   . DVT (deep venous thrombosis) ([place]) 2016   RIGHT LEG  . Edema leg   . Effusion of knee 12/30/2013  . Family history of adverse reaction to anesthesia    ADOPTED  . GERD (gastroesophageal reflux disease)   . 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 Headache(784.0)    MIGRAINES  . Heart burn   . Heart murmur   . History of kidney stones   . HLD (hyperlipidemia)   . Hx MRSA infection   . Hypothyroidism   . Lipoma of arm 05/11/2013  . Lower extremity edema   . Lymphedema 04/26/2016  . Post traumatic stress disorder (PTSD)   . PVD (peripheral vascular disease) ([place])   . Seasonal allergies   . Stress incontinence   . Thyroid disease   . Urge incontinence     Past Surgical History:  Procedure Laterality Date  . ABDOMINAL HYSTERECTOMY    . ANKLE SURGERY    . CHOLECYSTECTOMY    . CYSTOSCOPY N/A 04/09/2016   Procedure: CYSTOSCOPY;  Surgeon: Bjorn Loser, MD;  Location: [facility] ORS;  Service:  Urology;  Laterality: N/A;  . DILATION AND CURETTAGE OF UTERUS    . INTERSTIM IMPLANT PLACEMENT N/A 11/26/2016   Procedure: Barrie Lyme IMPLANT FIRST STAGE;  Surgeon: Bjorn Loser, MD;  Location: [facility] ORS;  Service: Urology;  Laterality: N/A;  . INTERSTIM IMPLANT PLACEMENT N/A 11/26/2016   Procedure: Barrie Lyme IMPLANT SECOND STAGE;  Surgeon: Bjorn Loser, MD;  Location: [facility] ORS;  Service: Urology;  Laterality: N/A;  . JOINT REPLACEMENT Right    knee  . KNEE ARTHROSCOPY Bilateral   . KNEE ARTHROSCOPY WITH LATERAL RELEASE Left 10/18/2015   Procedure: KNEE ARTHROSCOPY LATERAL AND PARTIAL SYNOVECTOMY;  Surgeon: Hessie Knows, MD;  Location: [facility] ORS;  Service: Orthopedics;  Laterality: Left;  . Lymph Node removal  2015   Neck  . PUBOVAGINAL SLING N/A 04/09/2016   Procedure: PUBO-VAGINAL SLING/ RETROPUBIC SLING;  Surgeon: Bjorn Loser, MD;  Location: [facility] ORS;  Service: Urology;  Laterality: N/A;  . REPLACEMENT TOTAL KNEE Right   . SHOULDER SURGERY Right 2014  . TUBAL LIGATION    . WRIST SURGERY Right    metal plate    There were no vitals filed for this visit.       Subjective Assessment - 04/08/17 1019    Subjective Pt states that  her low back hurts all the way across.  States that she has a pulling sensation, especially when she suddenly turns or after prolonged sitting or standing.   Pertinent History Pt states that her pain in her back began ~2 weeks ago.  She also had anterior abdomen pain due to inguinal hernias that began around the onset of her back pain.  Pt will be having sx repair of B hernias on 04/18/2017.   Limitations Sitting;Standing;Walking   How long can you sit comfortably? 30 minutes   How long can you stand comfortably? 1 hour   How long can you walk comfortably? 30 minutes to an hour before onset of pain   Diagnostic tests x-ray    Patient Stated Goals "help with pain"   Currently in Pain? Yes   Pain Score 4    Pain Location Back   Pain Orientation Lower    Pain Descriptors / Indicators Throbbing   Pain Type Acute pain   Pain Radiating Towards pain radiates down LE right and left   Pain Onset 1 to 4 weeks ago   Pain Frequency Several days a week   Aggravating Factors  standing , walking, sitting, bending   Pain Relieving Factors pain meds   Effect of Pain on Daily Activities reduction in activity   Multiple Pain Sites No      PAIN: 4/10 lumbar spine  POSTURE: WFL   PROM/AROM: sidebending - able to reach to joint line on B LE's Back bending - limited by pain;increase in symptoms traveling down B LE's, L > R Fwd bending - reach to knees; pulling sensation down R leg Rotational - pulling sensation, increase of symptoms B directions with L > R  STRENGTH:  Graded on a 0-5 scale Muscle Group Left Right                          Hip Flex 4-/5 4-/5  Hip Abd 4-/5 4-/5  Hip Add 3+/5 3+/5  Hip Ext 4-/5 4-/5      Knee Flex 5/5 5/5  Knee Ext 5/5 5/5  Ankle DF 5/5 5/5       SENSATION:denies any numbness or tingling traveling down LE's   SPECIAL TESTS: Slump test - positive B LE with L>R SLR - pain at end availble range 90/90 HS length - 40 degrees B LE, increase of symptoms and increase stretch noted at end available range Prone arm raise - decreased B multifidi firing L>R  Palpation: Increased tenderness to CPA L3-L5 with reproduction of symptoms with CPA at L4 and L5; UPA of L5 Tenderness of B glutes and piriformis - TrP of R piriformis   GAIT: Decreased trunk rotation, arm swing, decreased speed, decreased cadence and guarding secondary to increased LBP with movement; after treatment pt demonstrated decreased guarding with improved trunk rotation and arm swing and faster gait speed       [facility] PT Assessment - 04/08/17 0922      Assessment   Medical Diagnosis low back pain/radiculopathy   Referring Provider Evern Bio H   Onset Date/Surgical Date 03/25/17   Hand Dominance Right   Next MD Visit 05/09/17   Prior  Therapy yes      Precautions   Precautions None     Restrictions   Weight Bearing Restrictions No     Balance Screen   Has the patient fallen in the past 6 months No     Home Environment  Living Environment Private residence   Living Arrangements Children   Available Help at Discharge Family   Type of [place] to [facility] Layout Two level     Prior Function   Level of Independence Independent with basic ADLs   Vocation Part time employment;On disability   Vocation Requirements standing   Leisure cross stitch, dot coloring books     Treatment:   There Ex: Supine hamstring stretch with belt 2 x 30 sec hold on B LE's  Supine piriformis stretch 2 x 30 sec hold B LE's Supine abdominal brace 5 x 10 sec hold - cues to not hold breath with contraction   Manual: Supine lumbar distraction with swiss ball 2 x 2 min hold - pt demonstrated improved pain level after distraction going from a 4/10 at start of session to 3/10 after manual intervention      Objective measurements completed on examination: See above findings.                  PT Education - 04/08/17 1011    Education provided Yes   Education Details PT plan of care; HEP   Person(s) Educated Patient   Methods Explanation;Demonstration;Handout   Comprehension Verbalized understanding          PT Short Term Goals - 04/08/17 1020      PT SHORT TERM GOAL #1   Title Pt will have decreased overall pain levels to 3/10 with activity in order to demonstrate improved function and activty tolerance.   Baseline Worst: 9/10, Best: 4/10   Time 4   Period Weeks   Status New   Target Date 05/06/17     PT SHORT TERM GOAL #2   Title Pt will improve Mod ODI to 35% in order to demonstrate improved self-reported function and decreased pain.   Baseline 52% disability   Time 4   Period Weeks   Status New   Target Date 05/06/17           PT Long Term Goals -  04/08/17 1021      PT LONG TERM GOAL #1   Title Pt will be independent with HEP in order to manage symptoms and decrease pain levels.   Time 8   Period Weeks   Status New   Target Date 06/03/17     PT LONG TERM GOAL #2   Title Pt will decrease Mod ODI score to <10% in order to demonstrate improved self-reported function and improved pain levels.   Baseline 52% disability   Time 8   Period Weeks   Status New   Target Date 06/03/17     PT LONG TERM GOAL #3   Title Pt will demonstrate improved B hip strength to 4+/5 in all limited planes   Baseline gross hip strength assessment: 3+/5 hip ADD, 4-/5 other planes   Time 8   Period Weeks   Status New   Target Date 06/03/17     PT LONG TERM GOAL #4   Title Pt will demonstrate improved pain levels to 2/10 with activity for 2 weeks in order to demonstrate improved function and improved activity tolerance to activity.   Baseline Worst: 9/10, Best: 4/10   Time 8   Period Weeks   Status New   Target Date 06/03/17                Plan - 04/08/17 1013    Clinical Impression Statement Pt  is a 46 y/o female presenting with constant  low back pain 4/10  with symptoms radiating down B LE's.  Pt presents with increased pain with movement, especially bending and twisting, increased guarding and decreased functional abilities secondary to pain.  Pt has decreased strength of hip musculature and decreased firing of core musculature.She has mod ODI 52% disability.  Pt responded well to lumbar distraction, demonstrating improved pain levels from 4/10 to 3/10 at end of today's sesison.  Pt would benefit from skilled therapy services in order to address pain levels, decreased strength and decreaed funcitonal mobility.   Rehab Potential Good   Clinical Impairments Affecting Rehab Potential sx to address B inguinal hernias   PT Frequency 2x / week   PT Duration 8 weeks   PT Treatment/Interventions Aquatic Therapy;Cryotherapy;Moist Heat;Electrical  Stimulation;Traction;Gait training;Functional mobility training;Therapeutic activities;Therapeutic exercise;Patient/family education;Manual techniques;Passive range of motion;Dry needling   PT Next Visit Plan 5x sit to stand measure; begin strength and mobility activities   PT Home Exercise Plan piriformis stretch, hamstring stretch, abdominal brace   Consulted and Agree with Plan of Care Patient      Patient will benefit from skilled therapeutic intervention in order to improve the following deficits and impairments:  Abnormal gait, Decreased activity tolerance, Decreased mobility, Decreased range of motion, Decreased strength, Impaired flexibility  Visit Diagnosis: Muscle weakness (generalized)  Acute bilateral low back pain, with sciatica presence unspecified     Problem List Patient Active Problem List   Diagnosis Date Noted  . Redness of eye, left 08/22/2016  . Iron deficiency anemia 07/11/2016  . ANA positive 06/13/2016  . Bilateral hand pain 06/13/2016  . Lymphedema 04/26/2016  . Chronic venous insufficiency 04/26/2016  . Swelling of limb 04/26/2016  . Chronic diarrhea 09/07/2015  . Fever 09/07/2015  . Hypothyroidism 09/07/2015  . Hypovitaminosis D 09/07/2015  . Obesity 09/07/2015  . Weight loss, unintentional 09/07/2015  . Bipolar mixed affective disorder, moderate ([place]) 09/05/2015  . SUI (stress urinary incontinence, female) 05/18/2015  . Pain in shoulder 09/15/2014  . Carpal tunnel syndrome 09/15/2014  . Chronic left shoulder pain 09/15/2014  . Effusion of knee 12/30/2013  . 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 Gonalgia 12/30/2013  . Lipoma of arm 05/11/2013   This entire session was performed under direct supervision and direction of a licensed therapist/therapist assistant . I have personally read, edited and approve of the note as written. Netta Corrigan, SPT Alanson Puls, PT, DPT  04/08/2017, 11:26 AM  [place] MAIN [facility] SERVICES [address] [place], [place], [address] Phone: [phone]   Fax:  [phone]  Name: JERLISA DILIBERTO MRN: [id number] Date of Birth: [birth year]

## 2017-04-08 NOTE — Patient Instructions (Signed)
Piriformis Stretch    Lying on back, pull right knee toward opposite shoulder. Hold __30__ seconds. Repeat __2__ times. Do __2__ sessions per day.  http://gt2.exer.us/258   Copyright  VHI. All rights reserved.  (Home) Flexion: Pelvic Tilt    Lie with neck supported, knees bent, feet flat. Tighten and suck stomach in, pushing back down against surface. Do not push down with legs. Repeat __5__ times per set. Do __2__ sets per session. Do __7__ sessions per week.  Copyright  VHI. All rights reserved.  HIP: Hamstrings - Supine    Place strap around foot. Raise leg up, keep knee straight. Hold _30_ seconds. __2_ reps per set, __2_ sets per day, __7_ days per week   Copyright  VHI. All rights reserved.

## 2017-04-09 ENCOUNTER — Encounter
Admission: RE | Admit: 2017-04-09 | Discharge: 2017-04-09 | Disposition: A | Discharge status: Home / Self Care | Payer: Medicare Other | Source: Ambulatory Visit | Attending: Surgery | Admitting: Surgery

## 2017-04-09 ENCOUNTER — Other Ambulatory Visit: Payer: Self-pay

## 2017-04-09 DIAGNOSIS — Z01812 Encounter for preprocedural laboratory examination: Secondary | ICD-10-CM | POA: Diagnosis present

## 2017-04-09 DIAGNOSIS — K4020 Bilateral inguinal hernia, without obstruction or gangrene, not specified as recurrent: Secondary | ICD-10-CM | POA: Insufficient documentation

## 2017-04-09 LAB — SURGICAL PCR SCREEN
MRSA, PCR: NEGATIVE
Staphylococcus aureus: NEGATIVE

## 2017-04-09 NOTE — Patient Instructions (Addendum)
Your procedure is scheduled on: Thursday 04/18/17 Report to [place]. 2ND FLOOR MEDICAL MALL ENTRANCE. To find out your arrival time please call [phone] between 1PM - 3PM on Wednesday 04/17/17.  Remember: Instructions that are not followed completely may result in serious medical risk, up to and including death, or upon the discretion of your surgeon and anesthesiologist your surgery may need to be rescheduled.    __X__ 1. Do not eat anything after midnight the night before your    procedure.  No gum chewing or hard candies.  You may drink clear   liquids up to 2 hours before you are scheduled to arrive at the   hospital for your procedure. Do not drink clear liquids within 2   hours of scheduled arrival to the hospital as this may lead to your   procedure being delayed or rescheduled.       Clear liquids include:   Water or Apple juice without pulp   Clear carbohydrate beverage such as Clearfast or Gatorade   Black coffee or Clear Tea (no milk, no creamer, do not add anything   to the coffee or tea)    Diabetics should only drink water   __X__ 2. No Alcohol for 24 hours before or after surgery.   ____ 3. Bring all medications with you on the day of surgery if instructed.    __X__ 4. Notify your doctor if there is any change in your medical condition     (cold, fever, infections).             __X___5. No smoking within 24 hours of your surgery.     Do not wear jewelry, make-up, hairpins, clips or nail polish.  Do not wear lotions, powders, or perfumes.   Do not shave 48 hours prior to surgery. Men may shave face and neck.  Do not bring valuables to the hospital.    [facility] is not responsible for any belongings or valuables.               Contacts, dentures or bridgework may not be worn into surgery.  Leave your suitcase in the car. After surgery it may be brought to your room.  For patients admitted to the hospital, discharge time is determined by your                 treatment team.   Patients discharged the day of surgery will not be allowed to drive home.   Please read over the following fact sheets that you were given:   MRSA Information   __X__ Take these medicines the morning of surgery with A SIP OF WATER:    1. GABAPENTIN  2. LEVOTHYROXINE  3. RANITIDINE  4. colestipol  5.  6.  ____ Fleet Enema (as directed)   __X__ Use CHG Soap/SAGE wipes as directed  ____ Use inhalers on the day of surgery  ____ Stop metformin 2 days prior to surgery    ____ Take 1/2 of usual insulin dose the night before surgery and none on the morning of surgery.   ____ Stop Coumadin/Plavix/aspirin on   __X__ Stop Anti-inflammatories such as Advil, Aleve, Ibuprofen, Motrin, Naproxen, Naprosyn, Goodies,powder, or aspirin products.  OK to take Tylenol.   __X__ Stop supplements, Vitamin E, Fish Oil until after surgery.    ____ Bring C-Pap to the hospital.

## 2017-04-10 ENCOUNTER — Encounter: Payer: Self-pay | Admitting: Physical Therapy

## 2017-04-10 ENCOUNTER — Ambulatory Visit: Payer: Medicare Other | Admitting: Physical Therapy

## 2017-04-10 DIAGNOSIS — M545 Low back pain: Secondary | ICD-10-CM

## 2017-04-10 DIAGNOSIS — M6281 Muscle weakness (generalized): Secondary | ICD-10-CM

## 2017-04-10 NOTE — Therapy (Signed)
[place] MAIN [facility] SERVICES [address] [place], [place], [address] Phone: [phone]   Fax:  [phone]  Physical Therapy Treatment  Patient Details  Name: Alexis Christensen MRN: [id number] Date of Birth: 29-Jan-1971 Referring Provider: Danelle Berry  Encounter Date: 04/10/2017      PT End of Session - 04/10/17 0944    Visit Number 2   Number of Visits 17   Date for PT Re-Evaluation 06/03/17   PT Start Time 0930   PT Stop Time 1010   PT Time Calculation (min) 40 min   Activity Tolerance Patient tolerated treatment well   Behavior During Therapy [facility] for tasks assessed/performed      Past Medical History:  Diagnosis Date  . Adopted   . Anemia   . Anxiety   . Arthritis   . Bipolar 1 disorder ([place])   . Carpal tunnel syndrome 09/15/2014  . CHF (congestive heart failure) (HCC)    Dialostic CHF  . Chronic diarrhea 09/07/2015  . Chronic kidney disease    H/O KIDNEY STONES  . Chronic venous insufficiency 04/26/2016  . Depression   . DVT (deep venous thrombosis) ([place]) 2016   RIGHT LEG  . Edema leg   . Effusion of knee 12/30/2013  . Family history of adverse reaction to anesthesia    ADOPTED  . GERD (gastroesophageal reflux disease)   . 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 Headache(784.0)    MIGRAINES  . Heart burn   . Heart murmur   . History of kidney stones   . HLD (hyperlipidemia)   . Hx MRSA infection   . Hypothyroidism   . Lipoma of arm 05/11/2013  . Lower extremity edema   . Lymphedema 04/26/2016  . Post traumatic stress disorder (PTSD)   . PVD (peripheral vascular disease) ([place])   . Seasonal allergies   . Stress incontinence   . Thyroid disease   . Urge incontinence     Past Surgical History:  Procedure Laterality Date  . ABDOMINAL HYSTERECTOMY    . ANKLE SURGERY    . CHOLECYSTECTOMY    . CYSTOSCOPY N/A 04/09/2016   Procedure: CYSTOSCOPY;  Surgeon: Bjorn Loser, MD;  Location: [facility] ORS;  Service:  Urology;  Laterality: N/A;  . DILATION AND CURETTAGE OF UTERUS    . INTERSTIM IMPLANT PLACEMENT N/A 11/26/2016   Procedure: Barrie Lyme IMPLANT FIRST STAGE;  Surgeon: Bjorn Loser, MD;  Location: [facility] ORS;  Service: Urology;  Laterality: N/A;  . INTERSTIM IMPLANT PLACEMENT N/A 11/26/2016   Procedure: Barrie Lyme IMPLANT SECOND STAGE;  Surgeon: Bjorn Loser, MD;  Location: [facility] ORS;  Service: Urology;  Laterality: N/A;  . JOINT REPLACEMENT Right    knee  . KNEE ARTHROSCOPY Bilateral   . KNEE ARTHROSCOPY WITH LATERAL RELEASE Left 10/18/2015   Procedure: KNEE ARTHROSCOPY LATERAL AND PARTIAL SYNOVECTOMY;  Surgeon: Hessie Knows, MD;  Location: [facility] ORS;  Service: Orthopedics;  Laterality: Left;  . Lymph Node removal  2015   Neck  . PUBOVAGINAL SLING N/A 04/09/2016   Procedure: PUBO-VAGINAL SLING/ RETROPUBIC SLING;  Surgeon: Bjorn Loser, MD;  Location: [facility] ORS;  Service: Urology;  Laterality: N/A;  . REPLACEMENT TOTAL KNEE Right   . SHOULDER SURGERY Right 2014  . TUBAL LIGATION    . WRIST SURGERY Right    metal plate    There were no vitals filed for this visit.      Subjective Assessment - 04/10/17 0933    Subjective Pt reports having mild  to mod pain in her lumbar spine today.  She states that her pain is 5/10 and states that pain woke her up last night.  She states that she has been performing HEP since intial eval.   Pertinent History Pt states that her pain in her back began ~2 weeks ago.  She also had anterior abdomen pain due to inguinal hernias that began around the onset of her back pain.  Pt will be having sx repair of B hernias on 04/18/2017.   Limitations Sitting;Standing;Walking   How long can you sit comfortably? 30 minutes   How long can you stand comfortably? 1 hour   How long can you walk comfortably? 30 minutes to an hour before onset of pain   Diagnostic tests x-ray    Patient Stated Goals "help with pain"   Currently in Pain? Yes   Pain Score 5    Pain  Location Back   Pain Orientation Lower   Pain Descriptors / Indicators Aching  Catching   Pain Onset 1 to 4 weeks ago     Treatment:  There Ex: Prone knee extension with 3 sec hold focusing on increasing multifidi firing x 20 B LE's   Prone hip ext x 20 B LE's - cues to engage core muscles before performing hip ext  Hooklying abdominal bracing x 10 with 10 sec hold  Abdominal bracing with hip ABD/ER using red theraband x 20  Sidelying hip ABD with red theraband x 20 B LE's - cues for form and exercise technique  Lower trunk rotation with feet on swiss ball x 20 each direction - verbal and tactile cues for technique   Manual: STM and trigger point release to B paraspinals - increased tightness noted R > L Roller to B gluteals and piriformis region - R > L for increased tightness Manual traction with swissball 2 x 2 min bouts   Pain at start of session: 5/10 Pain at end of session: 3/10 Improved trunk rotation and decreased guarding noting with gait at end of treatment                        PT Education - 04/10/17 0943    Education provided Yes   Education Details continuation of HEP, core musculature training and the importance of strengthening deep stabilizing muscles   Person(s) Educated Patient   Methods Explanation   Comprehension Verbalized understanding;Returned demonstration          PT Short Term Goals - 04/08/17 1020      PT SHORT TERM GOAL #1   Title Pt will have decreased overall pain levels to 3/10 with activity in order to demonstrate improved function and activty tolerance.   Baseline Worst: 9/10, Best: 4/10   Time 4   Period Weeks   Status New   Target Date 05/06/17     PT SHORT TERM GOAL #2   Title Pt will improve Mod ODI to 35% in order to demonstrate improved self-reported function and decreased pain.   Baseline 52% disability   Time 4   Period Weeks   Status New   Target Date 05/06/17           PT Long Term Goals  - 04/08/17 1021      PT LONG TERM GOAL #1   Title Pt will be independent with HEP in order to manage symptoms and decrease pain levels.   Time 8   Period Weeks   Status New  Target Date 06/03/17     PT LONG TERM GOAL #2   Title Pt will decrease Mod ODI score to <10% in order to demonstrate improved self-reported function and improved pain levels.   Baseline 52% disability   Time 8   Period Weeks   Status New   Target Date 06/03/17     PT LONG TERM GOAL #3   Title Pt will demonstrate improved B hip strength to 4+/5 in all limited planes   Baseline gross hip strength assessment: 3+/5 hip ADD, 4-/5 other planes   Time 8   Period Weeks   Status New   Target Date 06/03/17     PT LONG TERM GOAL #4   Title Pt will demonstrate improved pain levels to 2/10 with activity for 2 weeks in order to demonstrate improved function and improved activity tolerance to activity.   Baseline Worst: 9/10, Best: 4/10   Time 8   Period Weeks   Status New   Target Date 06/03/17               Plan - 04/10/17 1011    Clinical Impression Statement Pt began core and hip muscle strengthening exercsies today, noting no increase of symptoms while performing exercises.  Pt had increased muscle guarding of B gluteals and paraspinals with R > L. Pt responded well to manual therapy interventions today, noting decreased pain and tight feelings.  Pt reports decreased pain level from 5/10 at start of session to 3/10 at end of session, and demonstrated less muscle guarding with ambulation at end of treatment.  Pt was educated on importance of improving core muscle strength and to use abdominal brace technqiue when performing daily tasks in order to decrease pain and chance of reinjury with daily tasks.  Pt would continue to benefit from skilled therapy services to futher strengthen hip and core muscles as well as reduce pain levels in order to improve overall functional mobility and quality of life.   Rehab  Potential Good   Clinical Impairments Affecting Rehab Potential sx to address B inguinal hernias   PT Frequency 2x / week   PT Duration 8 weeks   PT Treatment/Interventions Aquatic Therapy;Cryotherapy;Moist Heat;Electrical Stimulation;Traction;Gait training;Functional mobility training;Therapeutic activities;Therapeutic exercise;Patient/family education;Manual techniques;Passive range of motion;Dry needling   PT Next Visit Plan continue core and hip strengthening activities    PT Home Exercise Plan abdominal brace with daily activities; hooklying lower trunk rotation   Consulted and Agree with Plan of Care Patient      Patient will benefit from skilled therapeutic intervention in order to improve the following deficits and impairments:  Abnormal gait, Decreased activity tolerance, Decreased mobility, Decreased range of motion, Decreased strength, Impaired flexibility  Visit Diagnosis: Muscle weakness (generalized)  Acute bilateral low back pain, with sciatica presence unspecified     Problem List Patient Active Problem List   Diagnosis Date Noted  . Redness of eye, left 08/22/2016  . Iron deficiency anemia 07/11/2016  . ANA positive 06/13/2016  . Bilateral hand pain 06/13/2016  . Lymphedema 04/26/2016  . Chronic venous insufficiency 04/26/2016  . Swelling of limb 04/26/2016  . Chronic diarrhea 09/07/2015  . Fever 09/07/2015  . Hypothyroidism 09/07/2015  . Hypovitaminosis D 09/07/2015  . Obesity 09/07/2015  . Weight loss, unintentional 09/07/2015  . Bipolar mixed affective disorder, moderate ([place]) 09/05/2015  . SUI (stress urinary incontinence, female) 05/18/2015  . Pain in shoulder 09/15/2014  . Carpal tunnel syndrome 09/15/2014  . Chronic left shoulder pain 09/15/2014  .  Effusion of knee 12/30/2013  . 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 Gonalgia 12/30/2013  . Lipoma of arm 05/11/2013  This entire session was performed under direct supervision and direction of a  licensed therapist/therapist assistant . I have personally read, edited and approve of the note as written. Netta Corrigan, SPT Alanson Puls, PT, DPT 04/10/2017, 10:15 AM  [place] MAIN [facility] SERVICES [address] [place], [place], [address] Phone: [phone]   Fax:  [phone]  Name: ALTAGRACIA RONE MRN: [id number] Date of Birth: Feb 25, 1971

## 2017-04-15 ENCOUNTER — Ambulatory Visit: Payer: Medicare Other | Attending: Nurse Practitioner | Admitting: Physical Therapy

## 2017-04-15 ENCOUNTER — Encounter: Payer: Self-pay | Admitting: Physical Therapy

## 2017-04-15 DIAGNOSIS — M6281 Muscle weakness (generalized): Secondary | ICD-10-CM | POA: Insufficient documentation

## 2017-04-15 DIAGNOSIS — M545 Low back pain: Secondary | ICD-10-CM | POA: Insufficient documentation

## 2017-04-15 NOTE — Therapy (Signed)
[place] MAIN [facility] SERVICES [address] [place], [place], [address] Phone: [phone]   Fax:  [phone]  Physical Therapy Treatment  Patient Details  Name: Alexis Christensen MRN: [id number] Date of Birth: [birth year] Referring Provider: Danelle Berry  Encounter Date: 04/15/2017      PT End of Session - 04/15/17 0942    Visit Number 3   Number of Visits 17   Date for PT Re-Evaluation 06/03/17   PT Start Time 0930   PT Stop Time 2458   PT Time Calculation (min) 45 min   Activity Tolerance Patient tolerated treatment well   Behavior During Therapy [facility] for tasks assessed/performed      Past Medical History:  Diagnosis Date  . Adopted   . Anemia   . Anxiety   . Arthritis   . Bipolar 1 disorder ([place])   . Carpal tunnel syndrome 09/15/2014  . CHF (congestive heart failure) (HCC)    Dialostic CHF  . Chronic diarrhea 09/07/2015  . Chronic kidney disease    H/O KIDNEY STONES  . Chronic venous insufficiency 04/26/2016  . Depression   . DVT (deep venous thrombosis) ([place]) 2016   RIGHT LEG  . Edema leg   . Effusion of knee 12/30/2013  . Family history of adverse reaction to anesthesia    ADOPTED  . GERD (gastroesophageal reflux disease)   . 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 Headache(784.0)    MIGRAINES  . Heart burn   . Heart murmur   . History of kidney stones   . HLD (hyperlipidemia)   . Hx MRSA infection   . Hypothyroidism   . Lipoma of arm 05/11/2013  . Lower extremity edema   . Lymphedema 04/26/2016  . Post traumatic stress disorder (PTSD)   . PVD (peripheral vascular disease) ([place])   . Seasonal allergies   . Stress incontinence   . Thyroid disease   . Urge incontinence     Past Surgical History:  Procedure Laterality Date  . ABDOMINAL HYSTERECTOMY    . ANKLE SURGERY    . CHOLECYSTECTOMY    . CYSTOSCOPY N/A 04/09/2016   Procedure: CYSTOSCOPY;  Surgeon: Bjorn Loser, MD;  Location: [facility] ORS;  Service:  Urology;  Laterality: N/A;  . DILATION AND CURETTAGE OF UTERUS    . INTERSTIM IMPLANT PLACEMENT N/A 11/26/2016   Procedure: Barrie Lyme IMPLANT FIRST STAGE;  Surgeon: Bjorn Loser, MD;  Location: [facility] ORS;  Service: Urology;  Laterality: N/A;  . INTERSTIM IMPLANT PLACEMENT N/A 11/26/2016   Procedure: Barrie Lyme IMPLANT SECOND STAGE;  Surgeon: Bjorn Loser, MD;  Location: [facility] ORS;  Service: Urology;  Laterality: N/A;  . JOINT REPLACEMENT Right    knee  . KNEE ARTHROSCOPY Bilateral   . KNEE ARTHROSCOPY WITH LATERAL RELEASE Left 10/18/2015   Procedure: KNEE ARTHROSCOPY LATERAL AND PARTIAL SYNOVECTOMY;  Surgeon: Hessie Knows, MD;  Location: [facility] ORS;  Service: Orthopedics;  Laterality: Left;  . Lymph Node removal  2015   Neck  . PUBOVAGINAL SLING N/A 04/09/2016   Procedure: PUBO-VAGINAL SLING/ RETROPUBIC SLING;  Surgeon: Bjorn Loser, MD;  Location: [facility] ORS;  Service: Urology;  Laterality: N/A;  . REPLACEMENT TOTAL KNEE Right   . SHOULDER SURGERY Right 2014  . TUBAL LIGATION    . WRIST SURGERY Right    metal plate    There were no vitals filed for this visit.      Subjective Assessment - 04/15/17 0940    Subjective Pt notes increased pain  in lumbar spine today and over the weekend at 4/10.  Pt states that she is no longer having pain radiating down her LE's and states that her pain is central to her lumbar spine.  Pt will be having hernia sx on 04/18/17.   Pertinent History Pt states that her pain in her back began ~2 weeks ago.  She also had anterior abdomen pain due to inguinal hernias that began around the onset of her back pain.  Pt will be having sx repair of B hernias on 04/18/2017.   Limitations Sitting;Standing;Walking   How long can you sit comfortably? 30 minutes   How long can you stand comfortably? 1 hour   How long can you walk comfortably? 30 minutes to an hour before onset of pain   Diagnostic tests x-ray    Patient Stated Goals "help with pain"   Currently in  Pain? Yes   Pain Score 4    Pain Location Back   Pain Orientation Lower   Pain Onset 1 to 4 weeks ago      Manual: Trigger point release and soft tissue to lumbar spine region - increased guarding of paraspinals R > L; decreased tenderness and TrP's of glutes Manual traction using swiss ball - in neutral alignment and in L rotation Hip alignment sequence - before sequence, R ASIS elevated in comparison to L; after treatment R ASIS = L ASIS height  There Ex:  Prone knee extension with 5 sec hold to increase firing of multifidi x 20 reps B LE's  Hooklying abdominal brace x 10 reps with 10 sec hold  Bridges with abdominal brace x 10 reps - cues for form  Hooklying abdominal brace with hip abd/ER using red theraband x 15 reps with 5 sec pause  Hooklying marches with abdominal brace x 20 B LE's - cues to hold abdominal brace during movement  LTR x 20 reps   SLR x 10 reps B LE's - cues for proper form and exercise technique; pt noted increased pressure feeling in low back, but did not have increased pain during exercise   Lowest pain since last visit 2/10                            PT Education - 04/15/17 0942    Education provided Yes   Education Details role of core muscles in stability; educated about centralizing of symptoms as she improves   Person(s) Educated Patient   Methods Explanation   Comprehension Verbalized understanding          PT Short Term Goals - 04/08/17 1020      PT SHORT TERM GOAL #1   Title Pt will have decreased overall pain levels to 3/10 with activity in order to demonstrate improved function and activty tolerance.   Baseline Worst: 9/10, Best: 4/10   Time 4   Period Weeks   Status New   Target Date 05/06/17     PT SHORT TERM GOAL #2   Title Pt will improve Mod ODI to 35% in order to demonstrate improved self-reported function and decreased pain.   Baseline 52% disability   Time 4   Period Weeks   Status New    Target Date 05/06/17           PT Long Term Goals - 04/08/17 1021      PT LONG TERM GOAL #1   Title Pt will be independent with HEP in order  to manage symptoms and decrease pain levels.   Time 8   Period Weeks   Status New   Target Date 06/03/17     PT LONG TERM GOAL #2   Title Pt will decrease Mod ODI score to <10% in order to demonstrate improved self-reported function and improved pain levels.   Baseline 52% disability   Time 8   Period Weeks   Status New   Target Date 06/03/17     PT LONG TERM GOAL #3   Title Pt will demonstrate improved B hip strength to 4+/5 in all limited planes   Baseline gross hip strength assessment: 3+/5 hip ADD, 4-/5 other planes   Time 8   Period Weeks   Status New   Target Date 06/03/17     PT LONG TERM GOAL #4   Title Pt will demonstrate improved pain levels to 2/10 with activity for 2 weeks in order to demonstrate improved function and improved activity tolerance to activity.   Baseline Worst: 9/10, Best: 4/10   Time 8   Period Weeks   Status New   Target Date 06/03/17               Plan - 04/15/17 1014    Clinical Impression Statement Pt presented with increased pain in her lumbar spine today at 4/10, however pt notes no symptoms traveling down LE's in the past week.  Pt states that her lowest pain in the past weeks has been 2/10 and highest pain at 5/10.  Pt was able to perform core strengthening exercises today, with a focus on increasing core stability and improving activation of TA and multfidi.  Soft tissue and manual traction were implemented agin with decreased pain noted afterwards at 3/10.  Upon palpation, pt's R ASIS was elevated in comparison to L - after hip alignment sequence B ASIS were equal.  Pt noted decreased symptoms at end of session and had improved giat pattern with improved trunk rotation and decrease muscle guarding in comparison to start of today's session.  Pt would continue to benefit from skilled PT  services in order to further address core and LE weakness and improve overall pain levels with activity in order to improve functional mobility and improve quality of life.   Rehab Potential Good   Clinical Impairments Affecting Rehab Potential sx to address B inguinal hernias   PT Frequency 2x / week   PT Duration 8 weeks   PT Treatment/Interventions Aquatic Therapy;Cryotherapy;Moist Heat;Electrical Stimulation;Traction;Gait training;Functional mobility training;Therapeutic activities;Therapeutic exercise;Patient/family education;Manual techniques;Passive range of motion;Dry needling   PT Next Visit Plan continue core and hip strengthening activities    PT Home Exercise Plan abdominal brace with daily activities; hooklying lower trunk rotation   Consulted and Agree with Plan of Care Patient      Patient will benefit from skilled therapeutic intervention in order to improve the following deficits and impairments:  Abnormal gait, Decreased activity tolerance, Decreased mobility, Decreased range of motion, Decreased strength, Impaired flexibility  Visit Diagnosis: Muscle weakness (generalized)  Acute bilateral low back pain, with sciatica presence unspecified     Problem List Patient Active Problem List   Diagnosis Date Noted  . Redness of eye, left 08/22/2016  . Iron deficiency anemia 07/11/2016  . ANA positive 06/13/2016  . Bilateral hand pain 06/13/2016  . Lymphedema 04/26/2016  . Chronic venous insufficiency 04/26/2016  . Swelling of limb 04/26/2016  . Chronic diarrhea 09/07/2015  . Fever 09/07/2015  . Hypothyroidism 09/07/2015  . Hypovitaminosis  D 09/07/2015  . Obesity 09/07/2015  . Weight loss, unintentional 09/07/2015  . Bipolar mixed affective disorder, moderate ([place]) 09/05/2015  . SUI (stress urinary incontinence, female) 05/18/2015  . Pain in shoulder 09/15/2014  . Carpal tunnel syndrome 09/15/2014  . Chronic left shoulder pain 09/15/2014  . Effusion of knee  12/30/2013  . 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 Gonalgia 12/30/2013  . Lipoma of arm 05/11/2013   This entire session was performed under direct supervision and direction of a licensed therapist/therapist assistant . I have personally read, edited and approve of the note as written. Netta Corrigan, SPT Alanson Puls, PT, DPT  04/15/2017, 10:19 AM  [place] MAIN [facility] SERVICES [address] [place], [place], [address] Phone: [phone]   Fax:  [phone]  Name: CERINA LEARY MRN: [id number] Date of Birth: [birth year]

## 2017-04-17 ENCOUNTER — Ambulatory Visit: Payer: Medicare Other | Admitting: Physical Therapy

## 2017-04-17 MED ORDER — CEFAZOLIN SODIUM-DEXTROSE 2-4 GM/100ML-% IV SOLN
2.0000 g | INTRAVENOUS | Status: AC
  Administered 2017-04-18: 2 g via INTRAVENOUS

## 2017-04-18 ENCOUNTER — Ambulatory Visit
Admission: RE | Admit: 2017-04-18 | Discharge: 2017-04-18 | Disposition: A | Discharge status: Home / Self Care | Payer: Medicare Other | Source: Ambulatory Visit | Attending: Surgery | Admitting: Surgery

## 2017-04-18 ENCOUNTER — Ambulatory Visit: Payer: Medicare Other | Admitting: Registered Nurse

## 2017-04-18 ENCOUNTER — Encounter
Admission: RE | Disposition: A | Discharge status: Home / Self Care | Payer: Self-pay | Source: Ambulatory Visit | Attending: Surgery

## 2017-04-18 DIAGNOSIS — Z96659 Presence of unspecified artificial knee joint: Secondary | ICD-10-CM | POA: Insufficient documentation

## 2017-04-18 DIAGNOSIS — Z9049 Acquired absence of other specified parts of digestive tract: Secondary | ICD-10-CM | POA: Diagnosis not present

## 2017-04-18 DIAGNOSIS — N189 Chronic kidney disease, unspecified: Secondary | ICD-10-CM | POA: Insufficient documentation

## 2017-04-18 DIAGNOSIS — I872 Venous insufficiency (chronic) (peripheral): Secondary | ICD-10-CM | POA: Insufficient documentation

## 2017-04-18 DIAGNOSIS — K4020 Bilateral inguinal hernia, without obstruction or gangrene, not specified as recurrent: Secondary | ICD-10-CM | POA: Diagnosis present

## 2017-04-18 DIAGNOSIS — Z87442 Personal history of urinary calculi: Secondary | ICD-10-CM | POA: Insufficient documentation

## 2017-04-18 DIAGNOSIS — F17210 Nicotine dependence, cigarettes, uncomplicated: Secondary | ICD-10-CM | POA: Insufficient documentation

## 2017-04-18 DIAGNOSIS — I739 Peripheral vascular disease, unspecified: Secondary | ICD-10-CM | POA: Insufficient documentation

## 2017-04-18 DIAGNOSIS — Z885 Allergy status to narcotic agent status: Secondary | ICD-10-CM | POA: Diagnosis not present

## 2017-04-18 DIAGNOSIS — I509 Heart failure, unspecified: Secondary | ICD-10-CM | POA: Insufficient documentation

## 2017-04-18 DIAGNOSIS — E785 Hyperlipidemia, unspecified: Secondary | ICD-10-CM | POA: Diagnosis not present

## 2017-04-18 DIAGNOSIS — F4310 Post-traumatic stress disorder, unspecified: Secondary | ICD-10-CM | POA: Insufficient documentation

## 2017-04-18 DIAGNOSIS — Z79899 Other long term (current) drug therapy: Secondary | ICD-10-CM | POA: Diagnosis not present

## 2017-04-18 DIAGNOSIS — E039 Hypothyroidism, unspecified: Secondary | ICD-10-CM | POA: Diagnosis not present

## 2017-04-18 DIAGNOSIS — F319 Bipolar disorder, unspecified: Secondary | ICD-10-CM | POA: Insufficient documentation

## 2017-04-18 HISTORY — PX: INGUINAL HERNIA REPAIR: SHX194

## 2017-04-18 SURGERY — REPAIR, HERNIA, INGUINAL, BILATERAL, LAPAROSCOPIC
Anesthesia: General | Laterality: Bilateral

## 2017-04-18 MED ORDER — FENTANYL CITRATE (PF) 100 MCG/2ML IJ SOLN
INTRAMUSCULAR | Status: DC | PRN
  Administered 2017-04-18 (×2): 50 ug via INTRAVENOUS

## 2017-04-18 MED ORDER — ROCURONIUM BROMIDE 50 MG/5ML IV SOLN
INTRAVENOUS | Status: AC
  Filled 2017-04-18: qty 1

## 2017-04-18 MED ORDER — DEXAMETHASONE SODIUM PHOSPHATE 10 MG/ML IJ SOLN
INTRAMUSCULAR | Status: DC | PRN
  Administered 2017-04-18: 10 mg via INTRAVENOUS

## 2017-04-18 MED ORDER — HYDROCODONE-ACETAMINOPHEN 5-325 MG PO TABS: 1.0000 | ORAL_TABLET | Freq: Four times a day (QID) | ORAL | 0 refills | Status: DC | PRN

## 2017-04-18 MED ORDER — LIDOCAINE HCL (PF) 2 % IJ SOLN
INTRAMUSCULAR | Status: AC
  Filled 2017-04-18: qty 6

## 2017-04-18 MED ORDER — MIDAZOLAM HCL 2 MG/2ML IJ SOLN
INTRAMUSCULAR | Status: AC
  Filled 2017-04-18: qty 2

## 2017-04-18 MED ORDER — ACETAMINOPHEN 10 MG/ML IV SOLN
INTRAVENOUS | Status: AC
  Filled 2017-04-18: qty 100

## 2017-04-18 MED ORDER — ROCURONIUM BROMIDE 100 MG/10ML IV SOLN
INTRAVENOUS | Status: DC | PRN
  Administered 2017-04-18: 40 mg via INTRAVENOUS
  Administered 2017-04-18: 20 mg via INTRAVENOUS

## 2017-04-18 MED ORDER — PROPOFOL 10 MG/ML IV BOLUS
INTRAVENOUS | Status: AC
  Filled 2017-04-18: qty 20

## 2017-04-18 MED ORDER — HYDROCODONE-ACETAMINOPHEN 5-325 MG PO TABS
ORAL_TABLET | ORAL | Status: AC
  Filled 2017-04-18: qty 1

## 2017-04-18 MED ORDER — BUPIVACAINE-EPINEPHRINE (PF) 0.25% -1:200000 IJ SOLN
INTRAMUSCULAR | Status: AC
  Filled 2017-04-18: qty 30

## 2017-04-18 MED ORDER — ONDANSETRON HCL 4 MG/2ML IJ SOLN
INTRAMUSCULAR | Status: AC
  Filled 2017-04-18: qty 2

## 2017-04-18 MED ORDER — CHLORHEXIDINE GLUCONATE CLOTH 2 % EX PADS: 6.0000 | MEDICATED_PAD | Freq: Once | CUTANEOUS | Status: DC

## 2017-04-18 MED ORDER — GLYCOPYRROLATE 0.2 MG/ML IJ SOLN
INTRAMUSCULAR | Status: DC | PRN
  Administered 2017-04-18: 0.2 mg via INTRAVENOUS

## 2017-04-18 MED ORDER — LIDOCAINE HCL (CARDIAC) 20 MG/ML IV SOLN
INTRAVENOUS | Status: DC | PRN
  Administered 2017-04-18: 100 mg via INTRAVENOUS

## 2017-04-18 MED ORDER — ATROPINE SULFATE 1 MG/10ML IJ SOSY
PREFILLED_SYRINGE | INTRAMUSCULAR | Status: AC
  Filled 2017-04-18: qty 10

## 2017-04-18 MED ORDER — KETOROLAC TROMETHAMINE 30 MG/ML IJ SOLN
INTRAMUSCULAR | Status: DC | PRN
  Administered 2017-04-18: 30 mg via INTRAVENOUS

## 2017-04-18 MED ORDER — ACETAMINOPHEN 10 MG/ML IV SOLN
INTRAVENOUS | Status: DC | PRN
  Administered 2017-04-18: 1000 mg via INTRAVENOUS

## 2017-04-18 MED ORDER — DEXAMETHASONE SODIUM PHOSPHATE 10 MG/ML IJ SOLN
INTRAMUSCULAR | Status: AC
  Filled 2017-04-18: qty 1

## 2017-04-18 MED ORDER — ONDANSETRON HCL 4 MG/2ML IJ SOLN
INTRAMUSCULAR | Status: DC | PRN
  Administered 2017-04-18: 4 mg via INTRAVENOUS

## 2017-04-18 MED ORDER — FENTANYL CITRATE (PF) 100 MCG/2ML IJ SOLN: 25.0000 ug | INTRAMUSCULAR | Status: DC | PRN

## 2017-04-18 MED ORDER — ATROPINE SULFATE 0.4 MG/ML IJ SOLN
INTRAMUSCULAR | Status: AC
  Filled 2017-04-18: qty 1

## 2017-04-18 MED ORDER — LACTATED RINGERS IV SOLN
INTRAVENOUS | Status: DC
  Administered 2017-04-18 (×2): via INTRAVENOUS

## 2017-04-18 MED ORDER — MIDAZOLAM HCL 2 MG/2ML IJ SOLN
INTRAMUSCULAR | Status: DC | PRN
  Administered 2017-04-18: 2 mg via INTRAVENOUS

## 2017-04-18 MED ORDER — CEFAZOLIN SODIUM-DEXTROSE 2-4 GM/100ML-% IV SOLN
INTRAVENOUS | Status: AC
  Filled 2017-04-18: qty 100

## 2017-04-18 MED ORDER — HYDROCODONE-ACETAMINOPHEN 5-325 MG PO TABS
1.0000 | ORAL_TABLET | Freq: Four times a day (QID) | ORAL | Status: DC | PRN
  Administered 2017-04-18: 1 via ORAL

## 2017-04-18 MED ORDER — KETOROLAC TROMETHAMINE 30 MG/ML IJ SOLN
INTRAMUSCULAR | Status: AC
  Filled 2017-04-18: qty 1

## 2017-04-18 MED ORDER — SUGAMMADEX SODIUM 200 MG/2ML IV SOLN
INTRAVENOUS | Status: DC | PRN
  Administered 2017-04-18: 200 mg via INTRAVENOUS

## 2017-04-18 MED ORDER — BUPIVACAINE-EPINEPHRINE (PF) 0.25% -1:200000 IJ SOLN
INTRAMUSCULAR | Status: DC | PRN
  Administered 2017-04-18: 30 mL

## 2017-04-18 MED ORDER — FENTANYL CITRATE (PF) 100 MCG/2ML IJ SOLN
INTRAMUSCULAR | Status: AC
  Filled 2017-04-18: qty 2

## 2017-04-18 MED ORDER — SUGAMMADEX SODIUM 200 MG/2ML IV SOLN
INTRAVENOUS | Status: AC
  Filled 2017-04-18: qty 2

## 2017-04-18 MED ORDER — ATROPINE SULFATE 0.4 MG/ML IJ SOLN
INTRAMUSCULAR | Status: DC | PRN
  Administered 2017-04-18: 0.4 mg via INTRAVENOUS

## 2017-04-18 MED ORDER — PROPOFOL 10 MG/ML IV BOLUS
INTRAVENOUS | Status: DC | PRN
  Administered 2017-04-18: 150 mg via INTRAVENOUS

## 2017-04-18 MED ORDER — ONDANSETRON HCL 4 MG/2ML IJ SOLN: 4.0000 mg | Freq: Once | INTRAMUSCULAR | Status: DC | PRN

## 2017-04-18 SURGICAL SUPPLY — 44 items
ADH SKN CLS APL DERMABOND .7 (GAUZE/BANDAGES/DRESSINGS) ×1
APPLICATOR COTTON TIP 6IN STRL (MISCELLANEOUS) ×3 IMPLANT
BALN DSCT LAPSCP LRG KNDY DSTN (BALLOONS) ×1
CANISTER SUCT 1200ML W/VALVE (MISCELLANEOUS) ×3 IMPLANT
CHLORAPREP W/TINT 26ML (MISCELLANEOUS) ×3 IMPLANT
DEFOGGER SCOPE WARMER CLEARIFY (MISCELLANEOUS) ×3 IMPLANT
DERMABOND ADVANCED (GAUZE/BANDAGES/DRESSINGS) ×2
DERMABOND ADVANCED .7 DNX12 (GAUZE/BANDAGES/DRESSINGS) ×1 IMPLANT
DEVICE SECURE STRAP 25 ABSORB (INSTRUMENTS) ×3 IMPLANT
DISSECT BALLN SPACEMKR OVL PDB (BALLOONS) ×3
DISSECTOR BALLN SPCMKR OVL PDB (BALLOONS) ×1 IMPLANT
DISSECTOR KITTNER STICK (MISCELLANEOUS) ×2 IMPLANT
DISSECTORS/KITTNER STICK (MISCELLANEOUS) ×6
DRAPE INCISE IOBAN 66X45 STRL (DRAPES) ×3 IMPLANT
ELECT REM PT RETURN 9FT ADLT (ELECTROSURGICAL) ×3
ELECTRODE REM PT RTRN 9FT ADLT (ELECTROSURGICAL) ×1 IMPLANT
ENDOLOOP SUT PDS II  0 18 (SUTURE)
ENDOLOOP SUT PDS II 0 18 (SUTURE) IMPLANT
GLOVE BIO SURGEON STRL SZ7 (GLOVE) ×3 IMPLANT
GOWN STRL REUS W/ TWL LRG LVL3 (GOWN DISPOSABLE) ×2 IMPLANT
GOWN STRL REUS W/TWL LRG LVL3 (GOWN DISPOSABLE) ×6
HEMOSTAT SURGICEL 2X14 (HEMOSTASIS) ×2 IMPLANT
HEMOSTAT SURGICEL 2X3 (HEMOSTASIS) ×2 IMPLANT
IRRIGATION STRYKERFLOW (MISCELLANEOUS) ×1 IMPLANT
IRRIGATOR STRYKERFLOW (MISCELLANEOUS) ×3
IV NS 1000ML (IV SOLUTION) ×3
IV NS 1000ML BAXH (IV SOLUTION) ×1 IMPLANT
L-HOOK LAP DISP 36CM (ELECTROSURGICAL)
LHOOK LAP DISP 36CM (ELECTROSURGICAL) IMPLANT
MESH 3DMAX 3X5 LT MED (Mesh General) ×2 IMPLANT
MESH 3DMAX 3X5 RT MED (Mesh General) ×2 IMPLANT
NEEDLE HYPO 22GX1.5 SAFETY (NEEDLE) ×3 IMPLANT
NS IRRIG 500ML POUR BTL (IV SOLUTION) ×3 IMPLANT
PACK LAP CHOLECYSTECTOMY (MISCELLANEOUS) ×3 IMPLANT
PENCIL ELECTRO HAND CTR (MISCELLANEOUS) ×3 IMPLANT
SCISSORS METZENBAUM CVD 33 (INSTRUMENTS) ×3 IMPLANT
SPONGE LAP 18X18 5 PK (GAUZE/BANDAGES/DRESSINGS) ×3 IMPLANT
SURGILUBE 2OZ TUBE FLIPTOP (MISCELLANEOUS) ×3 IMPLANT
SUT MNCRL AB 4-0 PS2 18 (SUTURE) ×3 IMPLANT
SUT VICRYL 0 AB UR-6 (SUTURE) ×3 IMPLANT
TRAY FOLEY CATH SILVER 16FR LF (SET/KITS/TRAYS/PACK) IMPLANT
TROCAR 5MM SINGLE VERSAONE (TROCAR) ×6 IMPLANT
TROCAR BALLN 10M OMST10SB SPAC (TROCAR) ×3 IMPLANT
TUBING INSUFFLATOR HI FLOW (MISCELLANEOUS) ×3 IMPLANT

## 2017-04-18 NOTE — Anesthesia Procedure Notes (Signed)
Procedure Name: Intubation Date/Time: 04/18/2017 11:46 AM Performed by: Doreen Salvage Pre-anesthesia Checklist: Patient identified, Patient being monitored, Timeout performed, Emergency Drugs available and Suction available Patient Re-evaluated:Patient Re-evaluated prior to induction Oxygen Delivery Method: Circle system utilized Preoxygenation: Pre-oxygenation with 100% oxygen Induction Type: IV induction Ventilation: Mask ventilation without difficulty Laryngoscope Size: Mac and 3 Grade View: Grade I Tube type: Oral Tube size: 7.0 mm Number of attempts: 1 Airway Equipment and Method: Stylet Placement Confirmation: ETT inserted through vocal cords under direct vision,  positive ETCO2 and breath sounds checked- equal and bilateral Secured at: 21 cm Tube secured with: Tape Dental Injury: Teeth and Oropharynx as per pre-operative assessment

## 2017-04-18 NOTE — Anesthesia Post-op Follow-up Note (Signed)
Anesthesia QCDR form completed.        

## 2017-04-18 NOTE — Anesthesia Postprocedure Evaluation (Signed)
Anesthesia Post Note  Patient: Alexis Christensen Current  Procedure(s) Performed: LAPAROSCOPIC BILATERAL INGUINAL HERNIA REPAIR (Bilateral )  Patient location during evaluation: PACU Anesthesia Type: General Level of consciousness: awake and alert Pain management: pain level controlled Vital Signs Assessment: post-procedure vital signs reviewed and stable Respiratory status: spontaneous breathing and respiratory function stable Cardiovascular status: stable Anesthetic complications: no     Last Vitals:  Vitals:   04/18/17 1333 04/18/17 1342  BP:  130/73  Pulse: 86 90  Resp: 12 11  Temp:  (!) 36.4 C  SpO2: 98% 93%    Last Pain:  Vitals:   04/18/17 1342  TempSrc:   PainSc: 2                  KEPHART,WILLIAM K

## 2017-04-18 NOTE — Transfer of Care (Signed)
Immediate Anesthesia Transfer of Care Note  Patient: Alexis Christensen  Procedure(s) Performed: Procedure(s): LAPAROSCOPIC BILATERAL INGUINAL HERNIA REPAIR (Bilateral)  Patient Location: PACU  Anesthesia Type:General  Level of Consciousness: sedated  Airway & Oxygen Therapy: Patient Spontanous Breathing and Patient connected to face mask oxygen  Post-op Assessment: Report given to RN and Post -op Vital signs reviewed and stable  Post vital signs: Reviewed and stable  Last Vitals:  Vitals:   04/18/17 1014 04/18/17 1312  BP: 125/70 (!) 141/82  Pulse: 69 91  Resp: 18 15  Temp: 36.7 C (!) 36.4 C  SpO2: 83%     Complications: No apparent anesthesia complications

## 2017-04-18 NOTE — H&P (View-Only) (Signed)
Surgical Consultation  04/05/2017  Alexis Christensen is an 46 y.o. female.   Chief Complaint  Patient presents with  . New Patient (Initial Visit)    Bilateral inguinal hernia repair--referral from [facility]     HPI: Alexis Christensen is seen in consultation at the request of [name], NP. She stated over the last 2 weeks has onset of bilateral inguinal and lower abdominal pain. Pain is intermittent moderate in intensity and sharp. Upon is exacerbated with Valsalva maneuvers. No nausea no vomiting she does feel bulges in bilateral groins. She is able to perform more than 4 Mets of activity without any shortness of breath or chest pain. She has had a previous abdominal hysterectomy and a cholecystectomy. I have personally reviewed the CT scan that was performed 10 days ago showing bilateral inguinal hernias without incarceration. No other acute abnormalities. CBC was unremarkable as well as her creatinine.  Past Medical History:  Diagnosis Date  . Adopted   . Anemia   . Anxiety   . Arthritis   . Bipolar 1 disorder ([place])   . Carpal tunnel syndrome 09/15/2014  . CHF (congestive heart failure) (HCC)    Dialostic CHF  . Chronic diarrhea 09/07/2015  . Chronic kidney disease    H/O KIDNEY STONES  . Chronic venous insufficiency 04/26/2016  . Depression   . DVT (deep venous thrombosis) ([place]) 2016   RIGHT LEG  . Edema leg   . Effusion of knee 12/30/2013  . Family history of adverse reaction to anesthesia    ADOPTED  . GERD (gastroesophageal reflux disease)   . 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 Headache(784.0)    MIGRAINES  . Heart burn   . Heart murmur   . History of kidney stones   . HLD (hyperlipidemia)   . Hx MRSA infection   . Hypothyroidism   . Lipoma of arm 05/11/2013  . Lower extremity edema   . Lymphedema 04/26/2016  . Post traumatic stress disorder (PTSD)   . PVD (peripheral vascular disease) ([place])   . Seasonal allergies   . Stress incontinence   . Thyroid  disease   . Urge incontinence     Past Surgical History:  Procedure Laterality Date  . ABDOMINAL HYSTERECTOMY    . ANKLE SURGERY    . CHOLECYSTECTOMY    . CYSTOSCOPY N/A 04/09/2016   Procedure: CYSTOSCOPY;  Surgeon: Bjorn Loser, MD;  Location: [facility];  Service: Urology;  Laterality: N/A;  . DILATION AND CURETTAGE OF UTERUS    . INTERSTIM IMPLANT PLACEMENT N/A 11/26/2016   Procedure: Barrie Lyme IMPLANT FIRST STAGE;  Surgeon: Bjorn Loser, MD;  Location: [facility];  Service: Urology;  Laterality: N/A;  . INTERSTIM IMPLANT PLACEMENT N/A 11/26/2016   Procedure: Barrie Lyme IMPLANT SECOND STAGE;  Surgeon: Bjorn Loser, MD;  Location: [facility];  Service: Urology;  Laterality: N/A;  . JOINT REPLACEMENT Right    knee  . KNEE ARTHROSCOPY Bilateral   . KNEE ARTHROSCOPY WITH LATERAL RELEASE Left 10/18/2015   Procedure: KNEE ARTHROSCOPY LATERAL AND PARTIAL SYNOVECTOMY;  Surgeon: Hessie Knows, MD;  Location: [facility];  Service: Orthopedics;  Laterality: Left;  . Lymph Node removal  2015   Neck  . PUBOVAGINAL SLING N/A 04/09/2016   Procedure: PUBO-VAGINAL SLING/ RETROPUBIC SLING;  Surgeon: Bjorn Loser, MD;  Location: [facility];  Service: Urology;  Laterality: N/A;  . REPLACEMENT TOTAL KNEE Right   . SHOULDER SURGERY Right 2014  . TUBAL LIGATION    . WRIST SURGERY Right  metal plate    Family History  Problem Relation Age of Onset  . Adopted: Yes  . Family history unknown: Yes    Social History:  reports that she has been smoking Cigarettes.  She has a 5.00 pack-year smoking history. She has never used smokeless tobacco. She reports that she drinks alcohol. She reports that she does not use drugs.  Allergies:  Allergies  Allergen Reactions  . Geodon [Ziprasidone Hydrochloride] Other (See Comments)    Numbness ,sob, headaches, blurred vision  . Ziprasidone Hcl Anaphylaxis    Other reaction(s): Other (See Comments), Unknown Numbness ,sob, headaches, blurred vision Numbness  ,sob, headaches, blurred vision  . Sulfa Antibiotics Hives  . Ace Inhibitors Rash  . Erythromycin Rash and Hives  . Hydromorphone Rash  . Lamictal [Lamotrigine] Rash  . Strawberry (Diagnostic) Rash    Medications reviewed.     ROS Full ROS performed and is otherwise negative other than what is stated in the HPI    BP 100/69   Pulse 67   Temp 97.8 F (36.6 C) (Oral)   Ht 5\' 7"  (1.702 m)   Wt 82.2 kg (181 lb 3.2 oz)   BMI 28.38 kg/m   Physical Exam  Constitutional: She is oriented to person, place, and time and well-developed, well-nourished, and in no distress. No distress.  Eyes: Conjunctivae are normal. Right eye exhibits no discharge. Left eye exhibits no discharge. No scleral icterus.  Neck: Normal range of motion. No JVD present. No tracheal deviation present.  Cardiovascular: Normal rate and intact distal pulses.   Pulmonary/Chest: Effort normal. No stridor. No respiratory distress. She exhibits no tenderness.  Abdominal: Soft. She exhibits no distension and no mass. There is tenderness. There is no rebound and no guarding.  Bilateral reducible Inguinal hernias, Left tender to palpation  Neurological: She is alert and oriented to person, place, and time. Gait normal. GCS score is 15.  Skin: Skin is warm and dry. She is not diaphoretic.  Psychiatric: Mood, memory, affect and judgment normal.  Nursing note and vitals reviewed.    Assessment/Plan: 46 year old female with symptomatic bilateral inguinal hernias. Discussed with the patient in detail about her disease process and given that the hernias are symptomatic I deftly recommended surgical intervention. Discussed with the patient in detail about approaches and my preferred approach given the fact that the hernias are bilateral will be a laparoscopic preperitoneal approach. I discussed possibility of incarceration, strangulation, enlargement in size over time, and the risk of emergency surgery in the face of  strangulation.  Also discussed the risk of surgery including recurrence which can be up to 30% in the case of complex hernias, use of prosthetic materials (mesh) and the increased risk of infxn, post-op infxn and the possible need for re-operation and removal of mesh if used, possibility of post-op SBO or ileus, and the risks of general anesthetic including MI, CVA, sudden death or even reaction to anesthetic medications. The patient and family understands the risks, any and all questions were answered to the patient's satisfaction. Plan for elective laparoscopic bilateral inguinal hernia repair. All questions were answered , sent to the referral provider  Caroleen Hamman, MD [facility] General Surgeon

## 2017-04-18 NOTE — Interval H&P Note (Signed)
History and Physical Interval Note:  04/18/2017 10:52 AM  Alexis Christensen  has presented today for surgery, with the diagnosis of BILATERAL INGUINAL HERNIA  The various methods of treatment have been discussed with the patient and family. After consideration of risks, benefits and other options for treatment, the patient has consented to  Procedure(s): [place] (Bilateral) as a surgical intervention .  The patient's history has been reviewed, patient examined, no change in status, stable for surgery.  I have reviewed the patient's chart and labs.  Questions were answered to the patient's satisfaction.     [place]

## 2017-04-18 NOTE — Anesthesia Preprocedure Evaluation (Signed)
Anesthesia Evaluation  Patient identified by MRN, date of birth, ID band Patient awake    Reviewed: Allergy & Precautions, NPO status , Patient's Chart, lab work & pertinent test results  Airway Mallampati: III       Dental   Pulmonary asthma , neg sleep apnea, neg COPD, Current Smoker,           Cardiovascular (-) hypertension+ Peripheral Vascular Disease and +CHF  (-) Past MI (-) dysrhythmias (-) Valvular Problems/Murmurs     Neuro/Psych Anxiety Depression Bipolar Disorder    GI/Hepatic Neg liver ROS, GERD  Medicated and Controlled,  Endo/Other  neg diabetesHypothyroidism   Renal/GU Renal InsufficiencyRenal disease     Musculoskeletal   Abdominal   Peds  Hematology   Anesthesia Other Findings   Reproductive/Obstetrics                           Anesthesia Physical Anesthesia Plan  ASA: III  Anesthesia Plan: General   Post-op Pain Management:    Induction: Intravenous  PONV Risk Score and Plan: 2 and Ondansetron and Dexamethasone  Airway Management Planned: Oral ETT  Additional Equipment:   Intra-op Plan:   Post-operative Plan:   Informed Consent: I have reviewed the patients History and Physical, chart, labs and discussed the procedure including the risks, benefits and alternatives for the proposed anesthesia with the patient or authorized representative who has indicated his/her understanding and acceptance.     Plan Discussed with:   Anesthesia Plan Comments:         Anesthesia Quick Evaluation

## 2017-04-18 NOTE — Discharge Instructions (Signed)

## 2017-04-18 NOTE — Op Note (Signed)
Laparoscopic Bilateral Inguinal Hernia Repair  Alexis Christensen DOBBERSTEIN  04/18/2017  Pre-operative Diagnosis: Bilateral Inguinal Hernia  Post-operative Diagnosis: Bilateral  Inguinal hernia  Procedure: Laparoscopic preperitoneal repair of bilateral inguinal hernias w 3 D Mesh ( BARD)  Surgeon: Caroleen Hamman, MD FACS  Anesthesia: Gen. with endotracheal tube   Findings: Bilateral Indirect Inguinal hernias  Procedure Details  The patient was seen again in the Holding Room. The benefits, complications, treatment options, and expected outcomes were discussed with the patient. The risks of bleeding, infection, recurrence of symptoms, failure to resolve symptoms, recurrence of hernia, ischemic orchitis, chronic pain syndrome or neuroma, were discussed again. The likelihood of improving the patient's symptoms with return to their baseline status is good.  The patient and/or family concurred with the proposed plan, giving informed consent.  The patient was taken to Operating Room, identified as Alexis Christensen and the procedure verified as Laparoscopic Inguinal Hernia Repair. Laterality confirmed.  A Time Out was held and the above information confirmed.  Prior to the induction of general anesthesia, antibiotic prophylaxis was administered. VTE prophylaxis was in place. General endotracheal anesthesia was then administered and tolerated well. After the induction, the abdomen was prepped with Chloraprep and draped in the sterile fashion. The patient was positioned in the supine position.  Local anesthetic  was injected into the skin near the umbilicus and an incision made. An incision was made and dissection down to the rectus fascia was performed. The fascia was incised and the muscle retracted laterally. The Covidien dissecting balloon was placed followed by the structural balloon. The preperitoneal space was insufflated and under direct vision 2 midline 5 mm ports were placed.  Dissection was performed to  delineate Cooper's ligament and the lateral extent of dissection was determined on each side. The nerve on the lateral abdominal wall was identified and kept in view at all times. The round ligament was skeletonized of the indirect sac and a lipoma which was retracted cephalad on each side. There was some venous bleeding on the left side that was controlled w pressure and small piece of surgicel. We identified the iliac vein and protected at all times.  Once this was complete, a 3D BARD medium mesh was placed into the preperitoneal space on each side. They were held in place with the absorbable tacking device avoiding the area of the nerve. Once assuring that the hernias were completely repaired and adequately covered, the preperitoneal space was desufflated under direct vision. There was no sign of peritoneal rent and no sign of bowel intrusion towards the mesh.  Once assuring that hemostasis was adequate the ports were removed and a figure-of-eight 0 Vicryl suture was placed at the fascial edges. 4-0 subcuticular Monocryl was used at all skin edges. Steri-Strips and Mastisol and sterile dressings were placed.  Patient tolerated the procedure well. There were no complications. He was taken to the recovery room in stable condition.               Caroleen Hamman, MD, FACS

## 2017-04-22 ENCOUNTER — Encounter: Payer: Self-pay | Admitting: Urology

## 2017-04-22 ENCOUNTER — Ambulatory Visit (INDEPENDENT_AMBULATORY_CARE_PROVIDER_SITE_OTHER): Payer: Medicare Other | Admitting: Urology

## 2017-04-22 VITALS — BP 114/73 | HR 52 | Ht 67.0 in | Wt 180.0 lb

## 2017-04-22 DIAGNOSIS — N3946 Mixed incontinence: Secondary | ICD-10-CM

## 2017-04-22 NOTE — Progress Notes (Signed)
04/22/2017 9:11 AM   Alexis Christensen 06-01-1971 [id number]  Referring provider: Perrin Maltese, MD [place], [place] [id number]  Chief Complaint  Patient presents with  . Urinary Incontinence    HPI: The patient had InterStim on 11/26/2016 for persistent overactive bladder and urgency incontinence after sling. The patient no longer has stress incontinence and had failed multiple medications. The need for the InterStim was discussed pre-operatively.  She is continent with intermittent urgency. She used to have mild bedwetting and does not have it either. Frequency also improved.  Incisions looked normal    PMH: Past Medical History:  Diagnosis Date  . Adopted   . Anemia   . Anxiety   . Arthritis   . Bipolar 1 disorder ([place])   . Carpal tunnel syndrome 09/15/2014  . CHF (congestive heart failure) (HCC)    Dialostic CHF  . Chronic diarrhea 09/07/2015  . Chronic kidney disease    H/O KIDNEY STONES  . Chronic venous insufficiency 04/26/2016  . Depression   . DVT (deep venous thrombosis) ([place]) 2016   RIGHT LEG  . Edema leg   . Effusion of knee 12/30/2013  . Family history of adverse reaction to anesthesia    ADOPTED  . GERD (gastroesophageal reflux disease)   . 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 Headache(784.0)    MIGRAINES  . Heart burn   . Heart murmur   . History of kidney stones   . HLD (hyperlipidemia)   . Hx MRSA infection   . Hypothyroidism   . Lipoma of arm 05/11/2013  . Lower extremity edema   . Lymphedema 04/26/2016  . Post traumatic stress disorder (PTSD)   . PVD (peripheral vascular disease) ([place])   . Seasonal allergies   . Stress incontinence   . Thyroid disease   . Urge incontinence     Surgical History: Past Surgical History:  Procedure Laterality Date  . ABDOMINAL HYSTERECTOMY    . ANKLE SURGERY    . CHOLECYSTECTOMY    . CYSTOSCOPY N/A 04/09/2016   Procedure: CYSTOSCOPY;  Surgeon: Bjorn Loser, MD;  Location: [facility];   Service: Urology;  Laterality: N/A;  . DILATION AND CURETTAGE OF UTERUS    . INGUINAL HERNIA REPAIR Bilateral 04/18/2017   Procedure: LAPAROSCOPIC BILATERAL INGUINAL HERNIA REPAIR;  Surgeon: Jules Husbands, MD;  Location: [facility];  Service: General;  Laterality: Bilateral;  . INTERSTIM IMPLANT PLACEMENT N/A 11/26/2016   Procedure: Barrie Lyme IMPLANT FIRST STAGE;  Surgeon: Bjorn Loser, MD;  Location: [facility];  Service: Urology;  Laterality: N/A;  . INTERSTIM IMPLANT PLACEMENT N/A 11/26/2016   Procedure: Barrie Lyme IMPLANT SECOND STAGE;  Surgeon: Bjorn Loser, MD;  Location: [facility];  Service: Urology;  Laterality: N/A;  . JOINT REPLACEMENT Right    knee  . KNEE ARTHROSCOPY Bilateral   . KNEE ARTHROSCOPY WITH LATERAL RELEASE Left 10/18/2015   Procedure: KNEE ARTHROSCOPY LATERAL AND PARTIAL SYNOVECTOMY;  Surgeon: Hessie Knows, MD;  Location: [facility];  Service: Orthopedics;  Laterality: Left;  . Lymph Node removal  2015   Neck  . PUBOVAGINAL SLING N/A 04/09/2016   Procedure: PUBO-VAGINAL SLING/ RETROPUBIC SLING;  Surgeon: Bjorn Loser, MD;  Location: [facility];  Service: Urology;  Laterality: N/A;  . REPLACEMENT TOTAL KNEE Right   . SHOULDER SURGERY Right 2014  . TUBAL LIGATION    . WRIST SURGERY Right    metal plate    Home Medications:  Allergies as of 04/22/2017  Reactions   Geodon [ziprasidone Hydrochloride] Other (See Comments)   Numbness ,sob, headaches, blurred vision   Ziprasidone Hcl Anaphylaxis, Other (See Comments)   Numbness ,sob, headaches, blurred vision   Sulfa Antibiotics Hives   Ace Inhibitors Rash   Erythromycin Rash, Hives   Hydromorphone Rash   Lamictal [lamotrigine] Rash   Strawberry (diagnostic) Rash      Medication List       Accurate as of 04/22/17  9:11 AM. Always use your most recent med list.          ABILIFY MAINTENA IM Inject 400 mg into the muscle every 30 (thirty) days.   clobetasol cream 0.05 % Commonly known as:   TEMOVATE Apply 1 application topically 2 (two) times daily as needed (for rash).   colestipol 1 g tablet Commonly known as:  COLESTID Take 2 g by mouth daily at 12 noon.   cyclobenzaprine 10 MG tablet Commonly known as:  FLEXERIL Take 10 mg by mouth 2 (two) times daily as needed for muscle spasms.   diclofenac sodium 1 % Gel Commonly known as:  VOLTAREN Apply 1 application topically 2 (two) times daily.   DULoxetine 60 MG capsule Commonly known as:  CYMBALTA Take 60 mg by mouth at bedtime.   fluticasone 50 MCG/ACT nasal spray Commonly known as:  FLONASE Place 2 sprays into both nostrils daily.   gabapentin 600 MG tablet Commonly known as:  NEURONTIN Take 600 mg by mouth 2 (two) times daily.   HYDROcodone-acetaminophen 5-325 MG tablet Commonly known as:  NORCO/VICODIN Take 1-2 tablets by mouth every 6 (six) hours as needed for moderate pain.   hydrOXYzine 25 MG tablet Commonly known as:  ATARAX/VISTARIL Take 50 mg by mouth every 8 (eight) hours as needed for itching.   levocetirizine 5 MG tablet Commonly known as:  XYZAL Take 5 mg by mouth every evening.   levothyroxine 150 MCG tablet Commonly known as:  SYNTHROID, LEVOTHROID Take 150 mcg by mouth daily before breakfast.   prazosin 2 MG capsule Commonly known as:  MINIPRESS Take 2 mg by mouth at bedtime.   ranitidine 150 MG tablet Commonly known as:  ZANTAC Take 150 mg by mouth 2 (two) times daily.   SUBOXONE 8-2 MG Film Generic drug:  Buprenorphine HCl-Naloxone HCl Dissolve 1 film under tongue twice daily   traMADol 50 MG tablet Commonly known as:  ULTRAM Take 1 tablet (50 mg total) by mouth every 6 (six) hours as needed.       Allergies:  Allergies  Allergen Reactions  . Geodon [Ziprasidone Hydrochloride] Other (See Comments)    Numbness ,sob, headaches, blurred vision  . Ziprasidone Hcl Anaphylaxis and Other (See Comments)    Numbness ,sob, headaches, blurred vision  . Sulfa Antibiotics Hives  .  Ace Inhibitors Rash  . Erythromycin Rash and Hives  . Hydromorphone Rash  . Lamictal [Lamotrigine] Rash  . Strawberry (Diagnostic) Rash    Family History: Family History  Problem Relation Age of Onset  . Adopted: Yes  . Family history unknown: Yes    Social History:  reports that she has been smoking Cigarettes.  She has a 5.00 pack-year smoking history. She has never used smokeless tobacco. She reports that she drinks alcohol. She reports that she does not use drugs.  ROS: UROLOGY Frequent Urination?: No Hard to postpone urination?: No Burning/pain with urination?: No Get up at night to urinate?: No Leakage of urine?: No Urine stream starts and stops?: No Trouble starting stream?: No  Do you have to strain to urinate?: No Blood in urine?: No Urinary tract infection?: No Sexually transmitted disease?: No Injury to kidneys or bladder?: No Painful intercourse?: No Weak stream?: No Currently pregnant?: No Vaginal bleeding?: No Last menstrual period?: n  Gastrointestinal Nausea?: No Vomiting?: No Indigestion/heartburn?: No Diarrhea?: No Constipation?: No  Constitutional Fever: No Night sweats?: No Weight loss?: Yes Fatigue?: No  Skin Skin rash/lesions?: No Itching?: No  Eyes Blurred vision?: No Double vision?: No  Ears/Nose/Throat Sore throat?: No Sinus problems?: No  Hematologic/Lymphatic Swollen glands?: No Easy bruising?: Yes  Cardiovascular Leg swelling?: Yes Chest pain?: No  Respiratory Cough?: No Shortness of breath?: No  Endocrine Excessive thirst?: No  Musculoskeletal Back pain?: Yes Joint pain?: No  Neurological Headaches?: No Dizziness?: No  Psychologic Depression?: Yes Anxiety?: Yes  Physical Exam: BP 114/73 (BP Location: Right Arm, Patient Position: Sitting, Cuff Size: Large)   Pulse (!) 52   Ht 5\' 7"  (1.702 m)   Wt 81.6 kg (180 lb)   BMI 28.19 kg/m   Constitutional:  Alert and oriented, No acute  distress.  Laboratory Data: Lab Results  Component Value Date   WBC 7.0 03/22/2017   HGB 12.8 03/22/2017   HCT 37.8 03/22/2017   MCV 88.7 03/22/2017   PLT 256 03/22/2017    Lab Results  Component Value Date   CREATININE 0.65 03/22/2017    No results found for: PSA  No results found for: TESTOSTERONE  No results found for: HGBA1C  Urinalysis    Component Value Date/Time   COLORURINE YELLOW (A) 03/22/2017 1655   APPEARANCEUR CLOUDY (A) 03/22/2017 1655   APPEARANCEUR Cloudy (A) 09/17/2016 1020   LABSPEC 1.026 03/22/2017 1655   PHURINE 6.0 03/22/2017 1655   GLUCOSEU 50 (A) 03/22/2017 1655   HGBUR NEGATIVE 03/22/2017 1655   BILIRUBINUR NEGATIVE 03/22/2017 1655   BILIRUBINUR Negative 09/17/2016 1020   KETONESUR NEGATIVE 03/22/2017 1655   PROTEINUR NEGATIVE 03/22/2017 1655   UROBILINOGEN 1.0 06/18/2011 1258   NITRITE NEGATIVE 03/22/2017 1655   LEUKOCYTESUR TRACE (A) 03/22/2017 1655   LEUKOCYTESUR Negative 09/17/2016 1020    Pertinent Imaging: None  Assessment & Plan:  I will reassess the patient in 1 year  There are no diagnoses linked to this encounter.  No Follow-up on file.  Reece Packer, MD  [facility] [address], [place], [place] [address] [phone]

## 2017-04-24 ENCOUNTER — Ambulatory Visit: Payer: Medicare Other | Admitting: Physical Therapy

## 2017-04-29 ENCOUNTER — Ambulatory Visit: Payer: Medicare Other | Admitting: Physical Therapy

## 2017-05-01 ENCOUNTER — Ambulatory Visit: Payer: Medicare Other | Admitting: Physical Therapy

## 2017-05-06 ENCOUNTER — Ambulatory Visit (INDEPENDENT_AMBULATORY_CARE_PROVIDER_SITE_OTHER): Payer: Medicare Other | Admitting: Vascular Surgery

## 2017-05-06 ENCOUNTER — Encounter (INDEPENDENT_AMBULATORY_CARE_PROVIDER_SITE_OTHER): Payer: Self-pay | Admitting: Vascular Surgery

## 2017-05-06 VITALS — BP 100/58 | HR 52 | Resp 16 | Ht 67.0 in | Wt 183.8 lb

## 2017-05-06 DIAGNOSIS — E039 Hypothyroidism, unspecified: Secondary | ICD-10-CM

## 2017-05-06 DIAGNOSIS — I890 Lymphedema, not elsewhere classified: Secondary | ICD-10-CM

## 2017-05-06 DIAGNOSIS — I872 Venous insufficiency (chronic) (peripheral): Secondary | ICD-10-CM | POA: Diagnosis not present

## 2017-05-06 NOTE — Progress Notes (Signed)
MRN : [id number]  Alexis Christensen is a 46 y.o. ([birth year]) female who presents with chief complaint of  Chief Complaint  Patient presents with  . Follow-up    79yr follow up  .  History of Present Illness: The patient returns to the office for followup evaluation regarding leg swelling.  The swelling has improved quite a bit and the pain associated with swelling has decreased substantially. There have not been any interval development of a ulcerations or wounds.  Since the previous visit the patient has been wearing graduated compression stockings occasionally and has noted little if any improvement in the lymphedema. She has been using her lymph pump on a regular basis and finds this extremely helpful.    The patient also states elevation during the day and exercise is being done too.     Current Meds  Medication Sig  . ARIPiprazole (ABILIFY MAINTENA IM) Inject 400 mg into the muscle every 30 (thirty) days.  . clobetasol cream (TEMOVATE) 7.84 % Apply 1 application topically 2 (two) times daily as needed (for rash).   . colestipol (COLESTID) 1 g tablet Take 2 g by mouth daily at 12 noon.   . cyclobenzaprine (FLEXERIL) 10 MG tablet Take 10 mg by mouth 2 (two) times daily as needed for muscle spasms.   . diclofenac sodium (VOLTAREN) 1 % GEL Apply 1 application topically 2 (two) times daily.  . DULoxetine (CYMBALTA) 60 MG capsule Take 60 mg by mouth at bedtime.   . fluticasone (FLONASE) 50 MCG/ACT nasal spray Place 2 sprays into both nostrils daily.   Marland Kitchen gabapentin (NEURONTIN) 600 MG tablet Take 600 mg by mouth 2 (two) times daily.   . hydrOXYzine (ATARAX/VISTARIL) 25 MG tablet Take 50 mg by mouth every 8 (eight) hours as needed for itching.  . levocetirizine (XYZAL) 5 MG tablet Take 5 mg by mouth every evening.  Marland Kitchen levothyroxine (SYNTHROID, LEVOTHROID) 150 MCG tablet Take 150 mcg by mouth daily before breakfast.   . prazosin (MINIPRESS) 2 MG capsule Take 2 mg by mouth at bedtime.  .  ranitidine (ZANTAC) 150 MG tablet Take 150 mg by mouth 2 (two) times daily.  . SUBOXONE 8-2 MG FILM Dissolve 1 film under tongue twice daily    Past Medical History:  Diagnosis Date  . Adopted   . Anemia   . Anxiety   . Arthritis   . Bipolar 1 disorder ([place])   . Carpal tunnel syndrome 09/15/2014  . CHF (congestive heart failure) (HCC)    Dialostic CHF  . Chronic diarrhea 09/07/2015  . Chronic kidney disease    H/O KIDNEY STONES  . Chronic venous insufficiency 04/26/2016  . Depression   . DVT (deep venous thrombosis) ([place]) 2016   RIGHT LEG  . Edema leg   . Effusion of knee 12/30/2013  . Family history of adverse reaction to anesthesia    ADOPTED  . GERD (gastroesophageal reflux disease)   . 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 Headache(784.0)    MIGRAINES  . Heart burn   . Heart murmur   . History of kidney stones   . HLD (hyperlipidemia)   . Hx MRSA infection   . Hypothyroidism   . Lipoma of arm 05/11/2013  . Lower extremity edema   . Lymphedema 04/26/2016  . Post traumatic stress disorder (PTSD)   . PVD (peripheral vascular disease) ([place])   . Seasonal allergies   . Stress incontinence   . Thyroid disease   . Urge  incontinence     Past Surgical History:  Procedure Laterality Date  . ABDOMINAL HYSTERECTOMY    . ANKLE SURGERY    . CHOLECYSTECTOMY    . CYSTOSCOPY N/A 04/09/2016   Procedure: CYSTOSCOPY;  Surgeon: Bjorn Loser, MD;  Location: [facility];  Service: Urology;  Laterality: N/A;  . DILATION AND CURETTAGE OF UTERUS    . INGUINAL HERNIA REPAIR Bilateral 04/18/2017   Procedure: LAPAROSCOPIC BILATERAL INGUINAL HERNIA REPAIR;  Surgeon: Jules Husbands, MD;  Location: [facility];  Service: General;  Laterality: Bilateral;  . INTERSTIM IMPLANT PLACEMENT N/A 11/26/2016   Procedure: Barrie Lyme IMPLANT FIRST STAGE;  Surgeon: Bjorn Loser, MD;  Location: [facility];  Service: Urology;  Laterality: N/A;  . INTERSTIM IMPLANT PLACEMENT N/A 11/26/2016   Procedure:  Barrie Lyme IMPLANT SECOND STAGE;  Surgeon: Bjorn Loser, MD;  Location: [facility];  Service: Urology;  Laterality: N/A;  . JOINT REPLACEMENT Right    knee  . KNEE ARTHROSCOPY Bilateral   . KNEE ARTHROSCOPY WITH LATERAL RELEASE Left 10/18/2015   Procedure: KNEE ARTHROSCOPY LATERAL AND PARTIAL SYNOVECTOMY;  Surgeon: Hessie Knows, MD;  Location: [facility];  Service: Orthopedics;  Laterality: Left;  . Lymph Node removal  2015   Neck  . PUBOVAGINAL SLING N/A 04/09/2016   Procedure: PUBO-VAGINAL SLING/ RETROPUBIC SLING;  Surgeon: Bjorn Loser, MD;  Location: [facility];  Service: Urology;  Laterality: N/A;  . REPLACEMENT TOTAL KNEE Right   . SHOULDER SURGERY Right 2014  . TUBAL LIGATION    . WRIST SURGERY Right    metal plate    Social History Social History  Substance Use Topics  . Smoking status: Current Some Day Smoker    Packs/day: 0.25    Years: 20.00    Types: Cigarettes    Last attempt to quit: 11/13/2012  . Smokeless tobacco: Never Used     Comment: STRESS RELATED  . Alcohol use 0.0 oz/week     Comment: occasional    Family History Family History  Problem Relation Age of Onset  . Adopted: Yes  . Family history unknown: Yes    Allergies  Allergen Reactions  . Geodon [Ziprasidone Hydrochloride] Other (See Comments)    Numbness ,sob, headaches, blurred vision  . Ziprasidone Hcl Anaphylaxis and Other (See Comments)    Numbness ,sob, headaches, blurred vision  . Sulfa Antibiotics Hives  . Ace Inhibitors Rash  . Erythromycin Rash and Hives  . Hydromorphone Rash  . Lamictal [Lamotrigine] Rash  . Strawberry (Diagnostic) Rash     REVIEW OF SYSTEMS (Negative unless checked)  Constitutional: [] Weight loss  [] Fever  [] Chills Cardiac: [] Chest pain   [] Chest pressure   [] Palpitations   [] Shortness of breath when laying flat   [] Shortness of breath with exertion. Vascular:  [] Pain in legs with walking   [x] Pain in legs with standing  [] History of DVT   [] Phlebitis    [x] Swelling in legs   [] Varicose veins   [] Non-healing ulcers Pulmonary:   [] Uses home oxygen   [] Productive cough   [] Hemoptysis   [] Wheeze  [] COPD   [] Asthma Neurologic:  [] Dizziness   [] Seizures   [] History of stroke   [] History of TIA  [] Aphasia   [] Vissual changes   [] Weakness or numbness in arm   [] Weakness or numbness in leg Musculoskeletal:   [] Joint swelling   [] Joint pain   [] Low back pain Hematologic:  [] Easy bruising  [] Easy bleeding   [] Hypercoagulable state   [] Anemic Gastrointestinal:  [] Diarrhea   [] Vomiting  [] Gastroesophageal reflux/heartburn   []   Difficulty swallowing. Genitourinary:  [] Chronic kidney disease   [] Difficult urination  [] Frequent urination   [] Blood in urine Skin:  [] Rashes   [] Ulcers  Psychological:  [] History of anxiety   []  History of major depression.  Physical Examination  Vitals:   05/06/17 1017  BP: (!) 100/58  Pulse: (!) 52  Resp: 16  Weight: 183 lb 12.8 oz (83.4 kg)  Height: 5\' 7"  (1.702 m)   Body mass index is 28.79 kg/m. Gen: WD/WN, NAD Head: [place]/AT, No temporalis wasting.  Ear/Nose/Throat: Hearing grossly intact, nares w/o erythema or drainage Eyes: PER, EOMI, sclera nonicteric.  Neck: Supple, no large masses.   Pulmonary:  Good air movement, no audible wheezing bilaterally, no use of accessory muscles.  Cardiac: RRR, no JVD Vascular:  2-3+ edema bilaterally skin is supple and soft, no inflamation Vessel Right Left  Radial Palpable Palpable  PT Palpable Palpable  DP Palpable Palpable  Gastrointestinal: Non-distended. No guarding/no peritoneal signs.  Musculoskeletal: M/S 5/5 throughout.  No deformity or atrophy.  Neurologic: CN 2-12 intact. Symmetrical.  Speech is fluent. Motor exam as listed above. Psychiatric: Judgment intact, Mood & affect appropriate for pt's clinical situation. Dermatologic: minimal venous rashes no ulcers noted.  No changes consistent with cellulitis. Lymph : No lichenification or skin changes of chronic  lymphedema.  CBC Lab Results  Component Value Date   WBC 7.0 03/22/2017   HGB 12.8 03/22/2017   HCT 37.8 03/22/2017   MCV 88.7 03/22/2017   PLT 256 03/22/2017    BMET    Component Value Date/Time   NA 140 03/22/2017 1655   NA 138 11/07/2012 0517   K 4.2 03/22/2017 1655   K 3.9 11/07/2012 0517   CL 105 03/22/2017 1655   CL 109 (H) 11/07/2012 0517   CO2 27 03/22/2017 1655   CO2 25 11/07/2012 0517   GLUCOSE 150 (H) 03/22/2017 1655   GLUCOSE 91 11/07/2012 0517   BUN 14 03/22/2017 1655   BUN 7 11/07/2012 0517   CREATININE 0.65 03/22/2017 1655   CREATININE 0.76 11/07/2012 0517   CALCIUM 9.4 03/22/2017 1655   CALCIUM 7.9 (L) 11/07/2012 0517   GFRNONAA >60 03/22/2017 1655   GFRNONAA >60 11/07/2012 0517   GFRAA >60 03/22/2017 1655   GFRAA >60 11/07/2012 0517   CrCl cannot be calculated (Patient's most recent lab result is older than the maximum 21 days allowed.).  COAG Lab Results  Component Value Date   INR 1.03 04/03/2016    Radiology No results found.  Assessment/Plan 1. Lymphedema  No surgery or intervention at this point in time.    I have reviewed my discussion with the patient regarding lymphedema and why it  causes symptoms.  Patient will continue wearing graduated compression stockings class 1 (20-30 mmHg) on a daily basis a prescription was given. The patient is reminded to put the stockings on first thing in the morning and removing them in the evening. The patient is instructed specifically not to sleep in the stockings.   In addition, behavioral modification throughout the day will be continued.  This will include frequent elevation (such as in a recliner), use of over the counter pain medications as needed and exercise such as walking.  I have reviewed systemic causes for chronic edema such as liver, kidney and cardiac etiologies and there does not appear to be any significant changes in these organ systems over the past year.  The patient is under the  impression that these organ systems are all stable and  unchanged.    The patient will continue aggressive use of the  lymph pump.  This will continue to improve the edema control and prevent sequela such as ulcers and infections.   The patient will follow-up with me on an annual basis.    2. Chronic venous insufficiency  No surgery or intervention at this point in time.    I have reviewed my discussion with the patient regarding lymphedema and why it  causes symptoms.  Patient will continue wearing graduated compression stockings class 1 (20-30 mmHg) on a daily basis a prescription was given. The patient is reminded to put the stockings on first thing in the morning and removing them in the evening. The patient is instructed specifically not to sleep in the stockings.   In addition, behavioral modification throughout the day will be continued.  This will include frequent elevation (such as in a recliner), use of over the counter pain medications as needed and exercise such as walking.  I have reviewed systemic causes for chronic edema such as liver, kidney and cardiac etiologies and there does not appear to be any significant changes in these organ systems over the past year.  The patient is under the impression that these organ systems are all stable and unchanged.    The patient will continue aggressive use of the  lymph pump.  This will continue to improve the edema control and prevent sequela such as ulcers and infections.   The patient will follow-up with me on an annual basis.    3. Hypothyroidism, unspecified type Continue Synthroid as already ordered, these medications have been reviewed and there are no changes at this time.     Hortencia Pilar, MD  05/06/2017 11:48 AM

## 2017-05-07 ENCOUNTER — Other Ambulatory Visit: Payer: Self-pay

## 2017-05-08 ENCOUNTER — Ambulatory Visit: Payer: Medicare Other | Admitting: Physical Therapy

## 2017-05-09 ENCOUNTER — Encounter: Payer: Self-pay | Admitting: Surgery

## 2017-05-09 ENCOUNTER — Ambulatory Visit (INDEPENDENT_AMBULATORY_CARE_PROVIDER_SITE_OTHER): Payer: Medicare Other | Admitting: Surgery

## 2017-05-09 VITALS — BP 120/78 | HR 80 | Temp 98.3°F | Wt 184.0 lb

## 2017-05-09 DIAGNOSIS — Z09 Encounter for follow-up examination after completed treatment for conditions other than malignant neoplasm: Secondary | ICD-10-CM

## 2017-05-09 NOTE — Patient Instructions (Signed)

## 2017-05-09 NOTE — Progress Notes (Signed)
S/p Lap BIH 10/4 Doing great No pain Taking PO  PE NAD Abd: soft, nt, incisions c/d/i. No infection or recurrence  A/p doing well No heavy lifting RTC prn

## 2017-05-13 ENCOUNTER — Ambulatory Visit: Payer: Medicare Other | Admitting: Physical Therapy

## 2017-05-15 ENCOUNTER — Encounter: Payer: Medicare Other | Admitting: Physical Therapy

## 2017-08-01 ENCOUNTER — Other Ambulatory Visit: Payer: Self-pay | Admitting: Nurse Practitioner

## 2017-08-01 DIAGNOSIS — Z1231 Encounter for screening mammogram for malignant neoplasm of breast: Secondary | ICD-10-CM

## 2017-08-19 ENCOUNTER — Ambulatory Visit
Admission: RE | Admit: 2017-08-19 | Discharge: 2017-08-19 | Disposition: A | Discharge status: Home / Self Care | Payer: Medicare Other | Source: Ambulatory Visit | Attending: Nurse Practitioner | Admitting: Nurse Practitioner

## 2017-08-19 DIAGNOSIS — Z1231 Encounter for screening mammogram for malignant neoplasm of breast: Secondary | ICD-10-CM | POA: Insufficient documentation

## 2017-10-11 IMAGING — MG MM DIGITAL SCREENING BILAT W/ CAD
5 series · 5 of 5 positions shown · non-contrast
Comparison: Previous exam(s).

CLINICAL DATA: Screening.

EXAM:
DIGITAL SCREENING BILATERAL MAMMOGRAM WITH CAD

[L MLO]
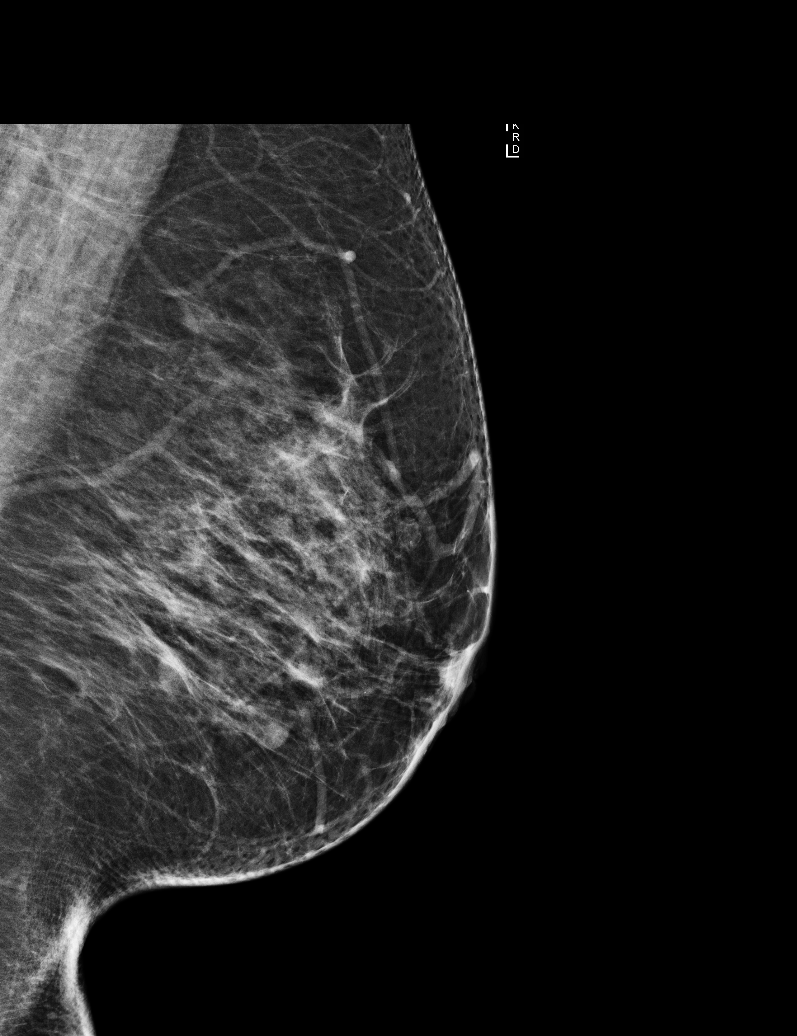

[R CC (1 of 2)]
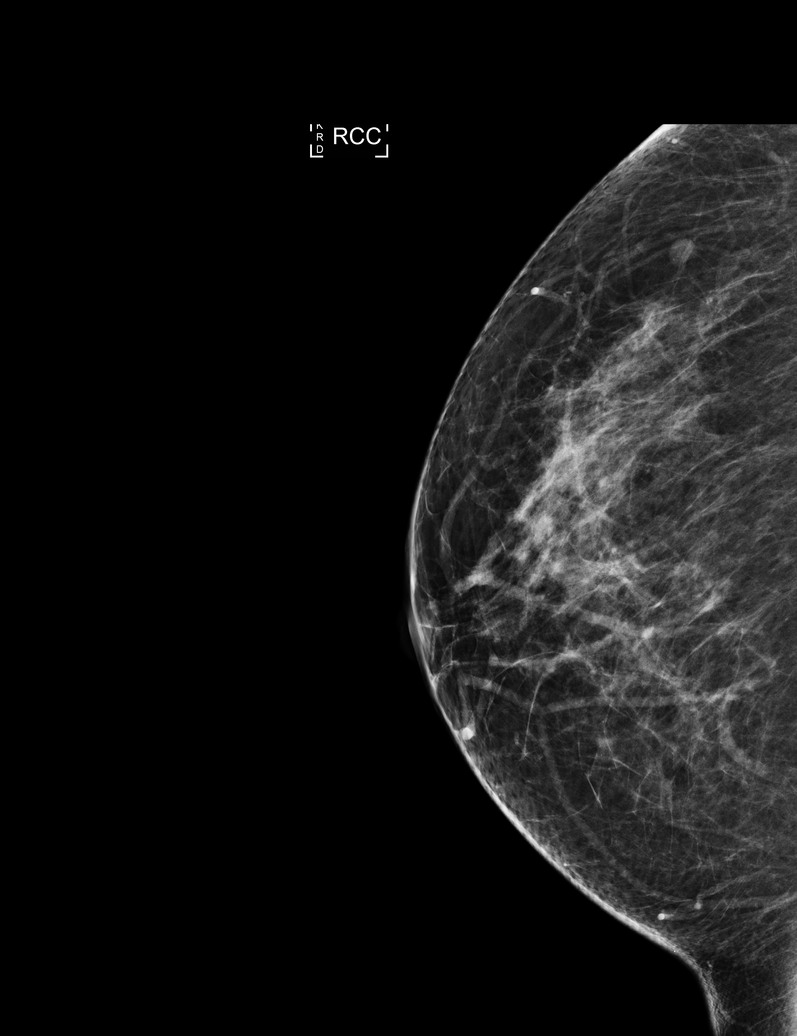

[R MLO]
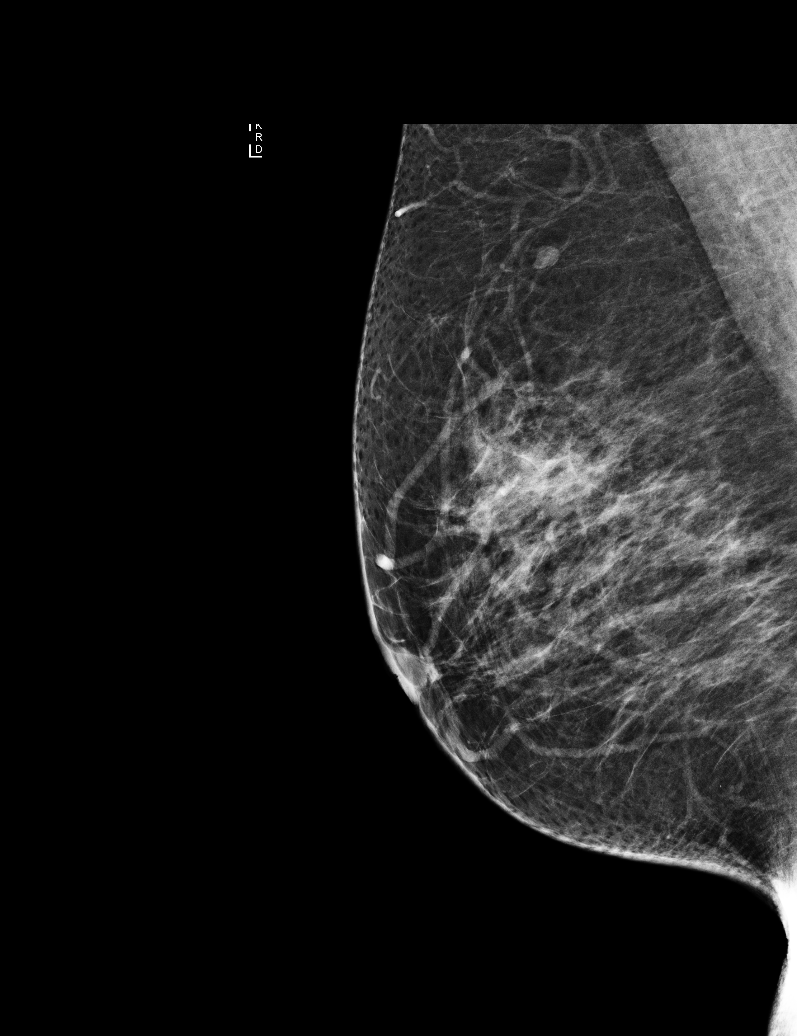

[R CC (2 of 2)]
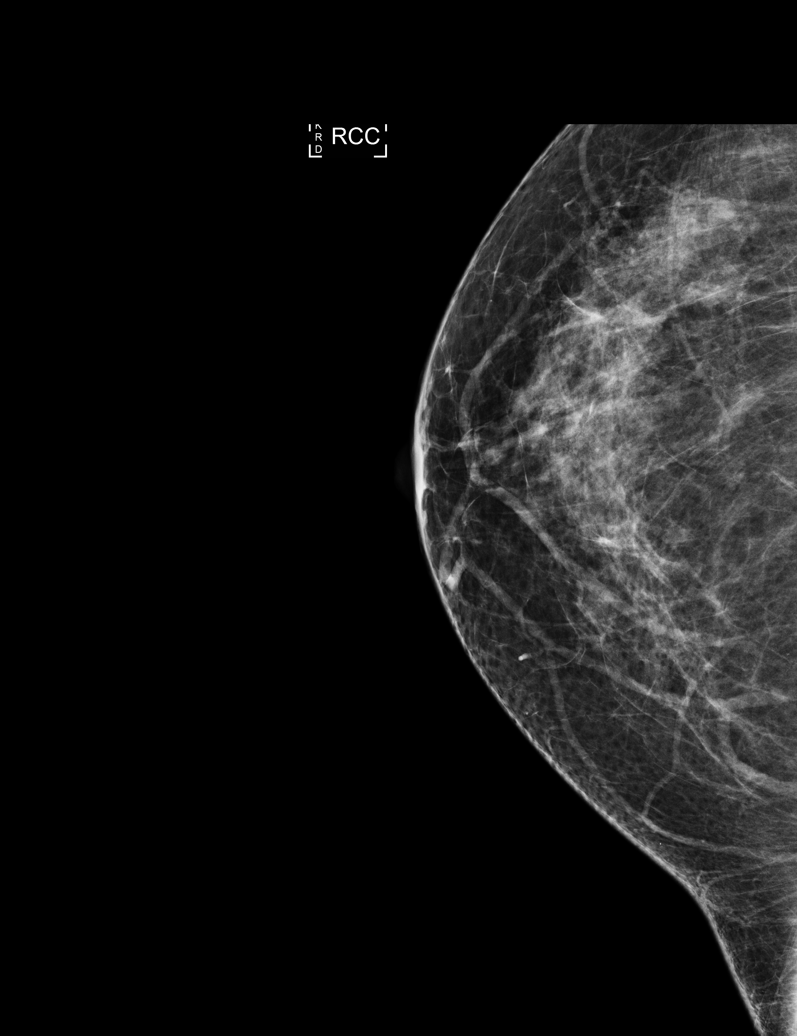

[L CC]
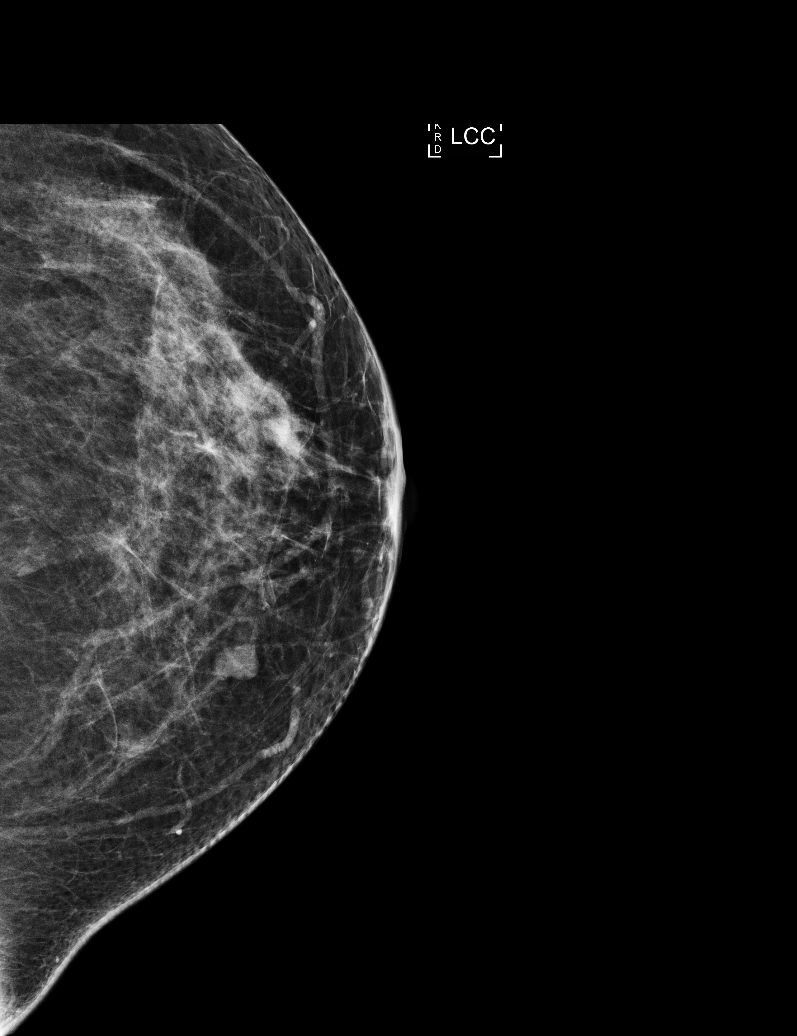

[5 of 5 positions shown; findings below may reference images not displayed]

ACR Breast Density Category b: There are scattered areas of
fibroglandular density.
FINDINGS: There are no findings suspicious for malignancy. Images were
processed with CAD.
IMPRESSION: No mammographic evidence of malignancy. A result letter of this
screening mammogram will be mailed directly to the patient.

RECOMMENDATION:
Screening mammogram in one year. (Code:AS-G-LCT)

BI-RADS CATEGORY  1: Negative.

## 2017-10-13 ENCOUNTER — Emergency Department: Payer: Medicare Other

## 2017-10-13 ENCOUNTER — Other Ambulatory Visit: Payer: Self-pay

## 2017-10-13 ENCOUNTER — Emergency Department
Admission: EM | Admit: 2017-10-13 | Discharge: 2017-10-13 | Disposition: A | Discharge status: Home / Self Care | Payer: Medicare Other | Attending: Emergency Medicine | Admitting: Emergency Medicine

## 2017-10-13 ENCOUNTER — Encounter: Payer: Self-pay | Admitting: Emergency Medicine

## 2017-10-13 DIAGNOSIS — I509 Heart failure, unspecified: Secondary | ICD-10-CM | POA: Insufficient documentation

## 2017-10-13 DIAGNOSIS — N189 Chronic kidney disease, unspecified: Secondary | ICD-10-CM | POA: Insufficient documentation

## 2017-10-13 DIAGNOSIS — R1032 Left lower quadrant pain: Secondary | ICD-10-CM | POA: Insufficient documentation

## 2017-10-13 DIAGNOSIS — Z79899 Other long term (current) drug therapy: Secondary | ICD-10-CM | POA: Insufficient documentation

## 2017-10-13 DIAGNOSIS — E039 Hypothyroidism, unspecified: Secondary | ICD-10-CM | POA: Diagnosis not present

## 2017-10-13 DIAGNOSIS — F17210 Nicotine dependence, cigarettes, uncomplicated: Secondary | ICD-10-CM | POA: Diagnosis not present

## 2017-10-13 DIAGNOSIS — R109 Unspecified abdominal pain: Secondary | ICD-10-CM

## 2017-10-13 DIAGNOSIS — Z96651 Presence of right artificial knee joint: Secondary | ICD-10-CM | POA: Diagnosis not present

## 2017-10-13 LAB — BASIC METABOLIC PANEL
ANION GAP: 8 (ref 5–15)
BUN: 12 mg/dL (ref 6–20)
CHLORIDE: 105 mmol/L (ref 101–111)
CO2: 26 mmol/L (ref 22–32)
Calcium: 8.7 mg/dL — ABNORMAL LOW (ref 8.9–10.3)
Creatinine, Ser: 0.73 mg/dL (ref 0.44–1.00)
GFR calc Af Amer: >60 mL/min (ref >60)
GLUCOSE: 103 mg/dL — AB (ref 65–99)
POTASSIUM: 3.7 mmol/L (ref 3.5–5.1)
Sodium: 139 mmol/L (ref 135–145)

## 2017-10-13 LAB — URINALYSIS, COMPLETE (UACMP) WITH MICROSCOPIC
BACTERIA UA: NONE SEEN
Bilirubin Urine: NEGATIVE
Glucose, UA: NEGATIVE mg/dL
Hgb urine dipstick: NEGATIVE
Ketones, ur: NEGATIVE mg/dL
Nitrite: NEGATIVE
PH: 7 (ref 5.0–8.0)
Protein, ur: NEGATIVE mg/dL
SPECIFIC GRAVITY, URINE: 1.012 (ref 1.005–1.030)

## 2017-10-13 LAB — CBC
HCT: 38.8 % (ref 35.0–47.0)
HEMOGLOBIN: 13 g/dL (ref 12.0–16.0)
MCH: 30.4 pg (ref 26.0–34.0)
MCHC: 33.5 g/dL (ref 32.0–36.0)
MCV: 90.9 fL (ref 80.0–100.0)
PLATELETS: 265 10*3/uL (ref 150–440)
RBC: 4.27 MIL/uL (ref 3.80–5.20)
RDW: 13.6 % (ref 11.5–14.5)
WBC: 7.7 10*3/uL (ref 3.6–11.0)

## 2017-10-13 MED ORDER — SODIUM CHLORIDE 0.9 % IV BOLUS
500.0000 mL | Freq: Once | INTRAVENOUS | Status: AC
  Administered 2017-10-13: 500 mL via INTRAVENOUS

## 2017-10-13 MED ORDER — SODIUM CHLORIDE 0.9 % IV BOLUS: 1000.0000 mL | Freq: Once | INTRAVENOUS | Status: DC

## 2017-10-13 MED ORDER — KETOROLAC TROMETHAMINE 30 MG/ML IJ SOLN
30.0000 mg | Freq: Once | INTRAMUSCULAR | Status: AC
  Administered 2017-10-13: 30 mg via INTRAVENOUS
  Filled 2017-10-13: qty 1

## 2017-10-13 NOTE — ED Triage Notes (Addendum)
Here for left flank/LLQ pain. Hx kidney stones. Pt thinks feels same.  Has had vomiting. No known fever.  No active vomiting. No distress noted.

## 2017-10-13 NOTE — ED Notes (Signed)
Pt here for abd pain LL. Pt has hx of kidney stones says feels the same.

## 2017-10-13 NOTE — ED Notes (Signed)
     [place]our[place] Rounding  Assessment Alert;Vital signs WDL  Intervention Call light w/in reach;Stretcher locked in lowest position;Pain assessed

## 2017-10-13 NOTE — ED Provider Notes (Addendum)
[facility] Emergency Department Provider Note ____________________________________________   First MD Initiated Contact with Patient 10/13/17 1126     (approximate)  I have reviewed the triage vital signs and the nursing notes.   HISTORY  Chief Complaint Flank Pain    HPI Alexis Christensen is a 47 y.o. female with past medical history as noted below who presents with left flank pain, acute onset 2 days ago, persistent course, radiating to the left lower quadrant.  And associated with some difficulty initiating her stream of urine.  Patient states it feels similar to prior kidney stones.  She reports some vomiting, but denies fever or dysuria.   Past Medical History:  Diagnosis Date  . Adopted   . Anemia   . Anxiety   . Arthritis   . Bipolar 1 disorder ([place])   . Carpal tunnel syndrome 09/15/2014  . CHF (congestive heart failure) (HCC)    Dialostic CHF  . Chronic diarrhea 09/07/2015  . Chronic kidney disease    H/O KIDNEY STONES  . Chronic venous insufficiency 04/26/2016  . Depression   . DVT (deep venous thrombosis) ([place]) 2016   RIGHT LEG  . Edema leg   . Effusion of knee 12/30/2013  . Family history of adverse reaction to anesthesia    ADOPTED  . GERD (gastroesophageal reflux disease)   . 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 Headache(784.0)    MIGRAINES  . Heart burn   . Heart murmur   . History of kidney stones   . HLD (hyperlipidemia)   . Hx MRSA infection   . Hypothyroidism   . Lipoma of arm 05/11/2013  . Lower extremity edema   . Lymphedema 04/26/2016  . Post traumatic stress disorder (PTSD)   . PVD (peripheral vascular disease) ([place])   . Seasonal allergies   . Stress incontinence   . Thyroid disease   . Urge incontinence     Patient Active Problem List   Diagnosis Date Noted  . Bilateral inguinal hernia without obstruction or gangrene   . Redness of eye, left 08/22/2016  . Iron deficiency anemia 07/11/2016  . ANA positive  06/13/2016  . Bilateral hand pain 06/13/2016  . Lymphedema 04/26/2016  . Chronic venous insufficiency 04/26/2016  . Swelling of limb 04/26/2016  . Chronic diarrhea 09/07/2015  . Fever 09/07/2015  . Hypothyroidism 09/07/2015  . Hypovitaminosis D 09/07/2015  . Obesity 09/07/2015  . Weight loss, unintentional 09/07/2015  . Bipolar mixed affective disorder, moderate ([place]) 09/05/2015  . SUI (stress urinary incontinence, female) 05/18/2015  . Pain in shoulder 09/15/2014  . Carpal tunnel syndrome 09/15/2014  . Chronic left shoulder pain 09/15/2014  . Effusion of knee 12/30/2013  . 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 Gonalgia 12/30/2013  . Lipoma of arm 05/11/2013    Past Surgical History:  Procedure Laterality Date  . ABDOMINAL HYSTERECTOMY    . ANKLE SURGERY    . CHOLECYSTECTOMY    . CYSTOSCOPY N/A 04/09/2016   Procedure: CYSTOSCOPY;  Surgeon: Bjorn Loser, MD;  Location: [facility];  Service: Urology;  Laterality: N/A;  . DILATION AND CURETTAGE OF UTERUS    . INGUINAL HERNIA REPAIR Bilateral 04/18/2017   Procedure: LAPAROSCOPIC BILATERAL INGUINAL HERNIA REPAIR;  Surgeon: Jules Husbands, MD;  Location: [facility];  Service: General;  Laterality: Bilateral;  . INTERSTIM IMPLANT PLACEMENT N/A 11/26/2016   Procedure: Barrie Lyme IMPLANT FIRST STAGE;  Surgeon: Bjorn Loser, MD;  Location: [facility];  Service: Urology;  Laterality: N/A;  .  INTERSTIM IMPLANT PLACEMENT N/A 11/26/2016   Procedure: Barrie Lyme IMPLANT SECOND STAGE;  Surgeon: Bjorn Loser, MD;  Location: [facility];  Service: Urology;  Laterality: N/A;  . JOINT REPLACEMENT Right    knee  . KNEE ARTHROSCOPY Bilateral   . KNEE ARTHROSCOPY WITH LATERAL RELEASE Left 10/18/2015   Procedure: KNEE ARTHROSCOPY LATERAL AND PARTIAL SYNOVECTOMY;  Surgeon: Hessie Knows, MD;  Location: [facility];  Service: Orthopedics;  Laterality: Left;  . Lymph Node removal  2015   Neck  . PUBOVAGINAL SLING N/A 04/09/2016   Procedure: PUBO-VAGINAL  SLING/ RETROPUBIC SLING;  Surgeon: Bjorn Loser, MD;  Location: [facility];  Service: Urology;  Laterality: N/A;  . REPLACEMENT TOTAL KNEE Right   . SHOULDER SURGERY Right 2014  . TUBAL LIGATION    . WRIST SURGERY Right    metal plate    Prior to Admission medications   Medication Sig Start Date End Date Taking? Authorizing Provider  ARIPiprazole (ABILIFY MAINTENA IM) Inject 400 mg into the muscle every 30 (thirty) days.    [provider]  clobetasol cream (TEMOVATE) 0.62 % Apply 1 application topically 2 (two) times daily as needed (for rash).     [provider]  colestipol (COLESTID) 1 g tablet Take 2 g by mouth daily at 12 noon.     [provider]  cyclobenzaprine (FLEXERIL) 10 MG tablet Take 10 mg by mouth 2 (two) times daily as needed for muscle spasms.  03/29/17   [provider]  diclofenac sodium (VOLTAREN) 1 % GEL Apply 1 application topically 2 (two) times daily.    [provider]  DULoxetine (CYMBALTA) 60 MG capsule Take 60 mg by mouth at bedtime.     [provider]  fluticasone (FLONASE) 50 MCG/ACT nasal spray Place 2 sprays into both nostrils daily.     [provider]  gabapentin (NEURONTIN) 600 MG tablet Take 600 mg by mouth 2 (two) times daily.     [provider]  hydrOXYzine (ATARAX/VISTARIL) 25 MG tablet Take 50 mg by mouth every 8 (eight) hours as needed for itching.    [provider]  levocetirizine (XYZAL) 5 MG tablet Take 5 mg by mouth every evening.    [provider]  levothyroxine (SYNTHROID, LEVOTHROID) 150 MCG tablet Take 150 mcg by mouth daily before breakfast.  02/24/16   [provider]  prazosin (MINIPRESS) 2 MG capsule Take 2 mg by mouth at bedtime.    [provider]  ranitidine (ZANTAC) 150 MG tablet Take 150 mg by mouth 2 (two) times daily.    [provider]  SUBOXONE 8-2 MG FILM Dissolve 1 film under tongue twice daily 09/14/16    [provider]  traMADol (ULTRAM) 50 MG tablet Take 1 tablet (50 mg total) by mouth every 6 (six) hours as needed. 03/22/17 03/22/18  Earleen Newport, MD    Allergies Geodon [ziprasidone hydrochloride]; Sulfacetamide sodium; Ziprasidone hcl; Sulfa antibiotics; Ace inhibitors; Erythromycin; Hydromorphone; Lamictal [lamotrigine]; and Strawberry (diagnostic)  Family History  Adopted: Yes  Family history unknown: Yes    Social History Social History   Tobacco Use  . Smoking status: Current Some Day Smoker    Packs/day: 0.25    Years: 20.00    Pack years: 5.00    Types: Cigarettes    Last attempt to quit: 11/13/2012    Years since quitting: 4.9  . Smokeless tobacco: Never Used  . Tobacco comment: STRESS RELATED  Substance Use Topics  . Alcohol use:  Yes    Alcohol/week: 0.0 oz    Comment: occasional  . Drug use: No    Review of Systems  Constitutional: No fever/chills. Eyes: No redness. ENT: No sore throat. Cardiovascular: Denies chest pain. Respiratory: Denies shortness of breath. Gastrointestinal: Positive for vomiting.  Genitourinary: Negative for hematuria.  Musculoskeletal: Positive for back pain. Skin: Negative for rash. Neurological: Negative for headache.   ____________________________________________   PHYSICAL EXAM:  VITAL SIGNS: ED Triage Vitals  Enc Vitals Group     BP 10/13/17 1048 (!) 104/53     Pulse Rate 10/13/17 1048 61     Resp 10/13/17 1048 16     Temp 10/13/17 1048 98.1 F (36.7 C)     Temp Source 10/13/17 1048 Oral     SpO2 10/13/17 1048 97 %     Weight 10/13/17 1044 180 lb (81.6 kg)     Height 10/13/17 1044 5\' 7"  (1.702 m)     Head Circumference --      Peak Flow --      Pain Score 10/13/17 1044 8     Pain Loc --      Pain Edu? --      Excl. in [place]? --     Constitutional: Alert and oriented.  Slightly uncomfortable appearing but in no acute distress. Eyes: Conjunctivae are normal.  Head: Atraumatic. Nose: No  congestion/rhinnorhea. Mouth/Throat: Mucous membranes are moist.   Neck: Normal range of motion.  Cardiovascular: Good peripheral circulation. Respiratory: Normal respiratory effort.  No retractions.  Gastrointestinal: Mild left lower quadrant tenderness.  No distention.  Genitourinary: Mild left CVA tenderness. Musculoskeletal: No lower extremity edema.  Extremities warm and well perfused.  Neurologic:  Normal speech and language. No gross focal neurologic deficits are appreciated.  Skin:  Skin is warm and dry. No rash noted. Psychiatric: Mood and affect are normal. Speech and behavior are normal.  ____________________________________________   LABS (all labs ordered are listed, but only abnormal results are displayed)  Labs Reviewed  URINALYSIS, COMPLETE (UACMP) WITH MICROSCOPIC - Abnormal; Notable for the following components:      Result Value   Color, Urine YELLOW (*)    APPearance HAZY (*)    Leukocytes, UA TRACE (*)    Squamous Epithelial / LPF 0-5 (*)    All other components within normal limits  BASIC METABOLIC PANEL - Abnormal; Notable for the following components:   Glucose, Bld 103 (*)    Calcium 8.7 (*)    All other components within normal limits  CBC   ____________________________________________  EKG  ED ECG REPORT I, Arta Silence, the attending physician, personally viewed and interpreted this ECG.  Date: 10/13/2017 EKG Time: 1209 Rate: 51 Rhythm: normal sinus rhythm QRS Axis: normal Intervals: RSR' in V1 ST/T Wave abnormalities: normal Narrative Interpretation: no evidence of acute ischemia  ____________________________________________  RADIOLOGY  CT abdomen: No ureteral stone, hydronephrosis, or other acute abnormalities  ____________________________________________   PROCEDURES  Procedure(s) performed: No  Procedures  Critical Care performed: No ____________________________________________   INITIAL IMPRESSION / ASSESSMENT  AND PLAN / ED COURSE  Pertinent labs & imaging results that were available during my care of the patient were reviewed by me and considered in my medical decision making (see chart for details).  47 year old female with past medical history as noted above presents with left flank and left lower quadrant pain with some vomiting for the last few days, similar to prior ureteral stones.  Past medical records reviewed in Epic and are  noncontributory.  On exam, the patient is relatively well-appearing, vital signs are normal, and the remainder the exam is as described above with mild left lower quadrant and left flank tenderness.  Overall this is most likely ureteral stone.  However since patient's UA shows no significant blood or other acute findings, other causes such as diverticulitis must be considered.  I will obtain a CT to evaluate for ureteral stone and rule out other etiologies.   ----------------------------------------- 12:58 PM on 10/13/2017 -----------------------------------------  CT shows no concerning acute findings.  Patient significantly more comfortable after Toradol.  Lab workup is also unremarkable.  No indication for further observation or intervention.  The patient feels well to go home.  Return precautions given, and she expresses understanding.     ____________________________________________   FINAL CLINICAL IMPRESSION(S) / ED DIAGNOSES  Final diagnoses:  Left flank pain      NEW MEDICATIONS STARTED DURING THIS VISIT:  Discharge Medication List as of 10/13/2017 12:57 PM       Note:  This document was prepared using Dragon voice recognition software and may include unintentional dictation errors.    Arta Silence, MD 10/13/17 1258    Arta Silence, MD 10/13/17 [phone]

## 2017-10-13 NOTE — Discharge Instructions (Addendum)
You can continue to take ibuprofen or Tylenol at home for pain as needed.  Return to the emergency department for new, worsening, or persistent severe pain, vomiting, fevers, weakness, or any other new or worsening symptoms that concern you.  Follow-up with your regular doctor.

## 2017-10-21 ENCOUNTER — Encounter: Payer: Self-pay | Admitting: Urology

## 2017-10-21 ENCOUNTER — Ambulatory Visit (INDEPENDENT_AMBULATORY_CARE_PROVIDER_SITE_OTHER): Payer: Medicare Other | Admitting: Urology

## 2017-10-21 VITALS — BP 106/70 | HR 51 | Ht 67.0 in | Wt 186.0 lb

## 2017-10-21 DIAGNOSIS — N393 Stress incontinence (female) (male): Secondary | ICD-10-CM | POA: Diagnosis not present

## 2017-10-21 DIAGNOSIS — R1032 Left lower quadrant pain: Secondary | ICD-10-CM | POA: Diagnosis not present

## 2017-10-21 LAB — URINALYSIS, COMPLETE
Bilirubin, UA: NEGATIVE
Glucose, UA: NEGATIVE
Ketones, UA: NEGATIVE
Leukocytes, UA: NEGATIVE
NITRITE UA: NEGATIVE
PH UA: 6 (ref 5.0–7.5)
Protein, UA: NEGATIVE
RBC, UA: NEGATIVE
Specific Gravity, UA: 1.025 (ref 1.005–1.030)
UUROB: 1 mg/dL (ref 0.2–1.0)

## 2017-10-21 LAB — MICROSCOPIC EXAMINATION
Epithelial Cells (non renal): >10 /hpf — ABNORMAL HIGH (ref 0–10)
RBC MICROSCOPIC, UA: NONE SEEN /HPF (ref 0–2)
WBC UA: NONE SEEN /HPF (ref 0–5)

## 2017-10-21 NOTE — Progress Notes (Signed)
10/21/2017 10:58 AM   Alexis Christensen [birth year] [id number]  Referring provider: Perrin Maltese, MD [place], [place] [address]  Chief Complaint  Patient presents with  . Urinary Incontinence    HPI: The patient had InterStim on 11/26/2016 for persistent overactive bladder and urgency incontinence after sling. The patient no longer has stress incontinence and had failed multiple medications. The need for the InterStim was discussed pre-operatively.  She is continent with intermittent urgency. She used to have mild bedwetting and does not have it either. Frequency also improved.    Today The patient came in today with a 3-week history of what she described initially as left upper quadrant pain but on further evaluation I believe it was a bit lower almost into her left lower quadrant.  It is daily.  It is present for 24 hours/day but waxes and wanes in severity.  She describes a negative gastrointestinal visit, CT scan and urine culture.  She had a CT scan stone protocol October 13, 2017 that was normal.  Lab test normal.  I did not see the urine culture in the medical record.  She stays quite dry during the day.  She gets up a few times at night.  If she holds it too long she can has some foot on the floor drawn but the device appears to be working very well  On physical examination there is no tenderness over the device on the left side.  All incisions look normal  Modifying factors: There are no other modifying factors  Associated signs and symptoms: There are no other associated signs and symptoms Aggravating and relieving factors: There are no other aggravating or relieving factors Severity: Moderate Duration: Persistent   PMH: Past Medical History:  Diagnosis Date  . Adopted   . Anemia   . Anxiety   . Arthritis   . Bipolar 1 disorder ([place])   . Carpal tunnel syndrome 09/15/2014  . CHF (congestive heart failure) (HCC)    Dialostic CHF  . Chronic diarrhea  09/07/2015  . Chronic kidney disease    H/O KIDNEY STONES  . Chronic venous insufficiency 04/26/2016  . Depression   . DVT (deep venous thrombosis) ([place]) 2016   RIGHT LEG  . Edema leg   . Effusion of knee 12/30/2013  . Family history of adverse reaction to anesthesia    ADOPTED  . GERD (gastroesophageal reflux disease)   . 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 Headache(784.0)    MIGRAINES  . Heart burn   . Heart murmur   . History of kidney stones   . HLD (hyperlipidemia)   . Hx MRSA infection   . Hypothyroidism   . Lipoma of arm 05/11/2013  . Lower extremity edema   . Lymphedema 04/26/2016  . Post traumatic stress disorder (PTSD)   . PVD (peripheral vascular disease) ([place])   . Seasonal allergies   . Stress incontinence   . Thyroid disease   . Urge incontinence     Surgical History: Past Surgical History:  Procedure Laterality Date  . ABDOMINAL HYSTERECTOMY    . ANKLE SURGERY    . CHOLECYSTECTOMY    . CYSTOSCOPY N/A 04/09/2016   Procedure: CYSTOSCOPY;  Surgeon: Bjorn Loser, MD;  Location: [facility] ORS;  Service: Urology;  Laterality: N/A;  . DILATION AND CURETTAGE OF UTERUS    . INGUINAL HERNIA REPAIR Bilateral 04/18/2017   Procedure: LAPAROSCOPIC BILATERAL INGUINAL HERNIA REPAIR;  Surgeon: Jules Husbands, MD;  Location: [facility];  Service: General;  Laterality: Bilateral;  . INTERSTIM IMPLANT PLACEMENT N/A 11/26/2016   Procedure: Barrie Lyme IMPLANT FIRST STAGE;  Surgeon: Bjorn Loser, MD;  Location: [facility];  Service: Urology;  Laterality: N/A;  . INTERSTIM IMPLANT PLACEMENT N/A 11/26/2016   Procedure: Barrie Lyme IMPLANT SECOND STAGE;  Surgeon: Bjorn Loser, MD;  Location: [facility];  Service: Urology;  Laterality: N/A;  . JOINT REPLACEMENT Right    knee  . KNEE ARTHROSCOPY Bilateral   . KNEE ARTHROSCOPY WITH LATERAL RELEASE Left 10/18/2015   Procedure: KNEE ARTHROSCOPY LATERAL AND PARTIAL SYNOVECTOMY;  Surgeon: Hessie Knows, MD;  Location: [facility];  Service:  Orthopedics;  Laterality: Left;  . Lymph Node removal  2015   Neck  . PUBOVAGINAL SLING N/A 04/09/2016   Procedure: PUBO-VAGINAL SLING/ RETROPUBIC SLING;  Surgeon: Bjorn Loser, MD;  Location: [facility];  Service: Urology;  Laterality: N/A;  . REPLACEMENT TOTAL KNEE Right   . SHOULDER SURGERY Right 2014  . TUBAL LIGATION    . WRIST SURGERY Right    metal plate    Home Medications:  Allergies as of 10/21/2017      Reactions   Geodon [ziprasidone Hydrochloride] Other (See Comments)   Numbness ,sob, headaches, blurred vision   Sulfacetamide Sodium Hives   Ziprasidone Hcl Anaphylaxis, Other (See Comments)   Other reaction(s): Other (See Comments), Unknown Numbness ,sob, headaches, blurred vision Numbness ,sob, headaches, blurred vision   Sulfa Antibiotics Hives   Other reaction(s): Unknown   Ace Inhibitors Rash   Erythromycin Rash, Hives   Hydromorphone Rash   Lamictal [lamotrigine] Rash   Other reaction(s): Unknown   Strawberry (diagnostic) Rash      Medication List        Accurate as of 10/21/17 10:58 AM. Always use your most recent med list.          ABILIFY MAINTENA IM Inject 400 mg into the muscle every 30 (thirty) days.   clobetasol cream 0.05 % Commonly known as:  TEMOVATE Apply 1 application topically 2 (two) times daily as needed (for rash).   colestipol 1 g tablet Commonly known as:  COLESTID Take 2 g by mouth daily at 12 noon.   cyclobenzaprine 10 MG tablet Commonly known as:  FLEXERIL Take 10 mg by mouth 2 (two) times daily as needed for muscle spasms.   diclofenac sodium 1 % Gel Commonly known as:  VOLTAREN Apply 1 application topically 2 (two) times daily.   DULoxetine 60 MG capsule Commonly known as:  CYMBALTA Take 60 mg by mouth at bedtime.   fluticasone 50 MCG/ACT nasal spray Commonly known as:  FLONASE Place 2 sprays into both nostrils daily.   gabapentin 600 MG tablet Commonly known as:  NEURONTIN Take 600 mg by mouth 2 (two) times  daily.   hydrOXYzine 25 MG tablet Commonly known as:  ATARAX/VISTARIL Take 50 mg by mouth every 8 (eight) hours as needed for itching.   levocetirizine 5 MG tablet Commonly known as:  XYZAL Take 5 mg by mouth every evening.   levothyroxine 150 MCG tablet Commonly known as:  SYNTHROID, LEVOTHROID Take 150 mcg by mouth daily before breakfast.   prazosin 2 MG capsule Commonly known as:  MINIPRESS Take 2 mg by mouth at bedtime.   ranitidine 150 MG tablet Commonly known as:  ZANTAC Take 150 mg by mouth 2 (two) times daily.   SUBOXONE 8-2 MG Film Generic drug:  Buprenorphine HCl-Naloxone HCl Dissolve 1 film under tongue twice daily   traMADol 50 MG tablet  Commonly known as:  ULTRAM Take 1 tablet (50 mg total) by mouth every 6 (six) hours as needed.       Allergies:  Allergies  Allergen Reactions  . Geodon [Ziprasidone Hydrochloride] Other (See Comments)    Numbness ,sob, headaches, blurred vision  . Sulfacetamide Sodium Hives  . Ziprasidone Hcl Anaphylaxis and Other (See Comments)    Other reaction(s): Other (See Comments), Unknown Numbness ,sob, headaches, blurred vision Numbness ,sob, headaches, blurred vision  . Sulfa Antibiotics Hives    Other reaction(s): Unknown  . Ace Inhibitors Rash  . Erythromycin Rash and Hives  . Hydromorphone Rash  . Lamictal [Lamotrigine] Rash    Other reaction(s): Unknown  . Strawberry (Diagnostic) Rash    Family History: Family History  Adopted: Yes  Family history unknown: Yes    Social History:  reports that she has been smoking cigarettes.  She has a 5.00 pack-year smoking history. She has never used smokeless tobacco. She reports that she drinks alcohol. She reports that she does not use drugs.  ROS: UROLOGY Frequent Urination?: Yes Hard to postpone urination?: Yes Burning/pain with urination?: No Get up at night to urinate?: No Leakage of urine?: No Urine stream starts and stops?: No Trouble starting stream?: No Do  you have to strain to urinate?: Yes Blood in urine?: No Urinary tract infection?: No Sexually transmitted disease?: No Injury to kidneys or bladder?: No Painful intercourse?: No Weak stream?: No Currently pregnant?: No Vaginal bleeding?: No Last menstrual period?: n  Gastrointestinal Nausea?: No Vomiting?: No Indigestion/heartburn?: No Diarrhea?: Yes Constipation?: No  Constitutional Fever: No Night sweats?: No Weight loss?: No Fatigue?: No  Skin Skin rash/lesions?: No Itching?: No  Eyes Blurred vision?: No Double vision?: No  Ears/Nose/Throat Sore throat?: No Sinus problems?: No  Hematologic/Lymphatic Swollen glands?: No Easy bruising?: No  Cardiovascular Leg swelling?: No Chest pain?: No  Respiratory Cough?: No Shortness of breath?: No  Endocrine Excessive thirst?: No  Musculoskeletal Back pain?: Yes Joint pain?: No  Neurological Headaches?: No Dizziness?: No  Psychologic Depression?: Yes Anxiety?: Yes  Physical Exam: BP 106/70 (BP Location: Right Arm, Patient Position: Sitting, Cuff Size: Large)   Pulse (!) 51   Ht 5\' 7"  (1.702 m)   Wt 186 lb (84.4 kg)   BMI 29.13 kg/m   Constitutional:  Alert and oriented, No acute distress.   Laboratory Data: Lab Results  Component Value Date   WBC 7.7 10/13/2017   HGB 13.0 10/13/2017   HCT 38.8 10/13/2017   MCV 90.9 10/13/2017   PLT 265 10/13/2017    Lab Results  Component Value Date   CREATININE 0.73 10/13/2017    No results found for: PSA  No results found for: TESTOSTERONE  No results found for: HGBA1C  Urinalysis    Component Value Date/Time   COLORURINE YELLOW (A) 10/13/2017 1051   APPEARANCEUR HAZY (A) 10/13/2017 1051   APPEARANCEUR Cloudy (A) 09/17/2016 1020   LABSPEC 1.012 10/13/2017 1051   PHURINE 7.0 10/13/2017 1051   GLUCOSEU NEGATIVE 10/13/2017 1051   HGBUR NEGATIVE 10/13/2017 1051   BILIRUBINUR NEGATIVE 10/13/2017 1051   BILIRUBINUR Negative 09/17/2016 1020    KETONESUR NEGATIVE 10/13/2017 1051   PROTEINUR NEGATIVE 10/13/2017 1051   UROBILINOGEN 1.0 06/18/2011 1258   NITRITE NEGATIVE 10/13/2017 1051   LEUKOCYTESUR TRACE (A) 10/13/2017 1051   LEUKOCYTESUR Negative 09/17/2016 1020    Pertinent Imaging: As noted  Assessment & Plan: Based upon history and today's physical examination I did not feel that the symptoms are  related to her InterStim device or genitourinary system.  I sent the urine for culture.  I will see her for her scheduled visit.  Reassurance given  1. SUI (stress urinary incontinence, female)  - Urinalysis, Complete   No follow-ups on file.  Reece Packer, MD  [facility] [address] [place], [place] [address] [phone]

## 2017-10-23 LAB — CULTURE, URINE COMPREHENSIVE

## 2017-10-28 DIAGNOSIS — N393 Stress incontinence (female) (male): Secondary | ICD-10-CM

## 2017-11-10 ENCOUNTER — Other Ambulatory Visit: Payer: Self-pay

## 2017-11-10 ENCOUNTER — Emergency Department: Payer: Medicare Other

## 2017-11-10 ENCOUNTER — Encounter: Payer: Self-pay | Admitting: *Deleted

## 2017-11-10 DIAGNOSIS — R0789 Other chest pain: Secondary | ICD-10-CM | POA: Insufficient documentation

## 2017-11-10 DIAGNOSIS — R001 Bradycardia, unspecified: Secondary | ICD-10-CM | POA: Insufficient documentation

## 2017-11-10 DIAGNOSIS — E039 Hypothyroidism, unspecified: Secondary | ICD-10-CM | POA: Insufficient documentation

## 2017-11-10 DIAGNOSIS — R200 Anesthesia of skin: Secondary | ICD-10-CM | POA: Insufficient documentation

## 2017-11-10 DIAGNOSIS — Z79899 Other long term (current) drug therapy: Secondary | ICD-10-CM | POA: Insufficient documentation

## 2017-11-10 DIAGNOSIS — Z96651 Presence of right artificial knee joint: Secondary | ICD-10-CM | POA: Diagnosis not present

## 2017-11-10 DIAGNOSIS — I5030 Unspecified diastolic (congestive) heart failure: Secondary | ICD-10-CM | POA: Insufficient documentation

## 2017-11-10 DIAGNOSIS — N189 Chronic kidney disease, unspecified: Secondary | ICD-10-CM | POA: Insufficient documentation

## 2017-11-10 DIAGNOSIS — F17210 Nicotine dependence, cigarettes, uncomplicated: Secondary | ICD-10-CM | POA: Insufficient documentation

## 2017-11-10 LAB — BASIC METABOLIC PANEL
ANION GAP: 6 (ref 5–15)
BUN: 11 mg/dL (ref 6–20)
CALCIUM: 8.4 mg/dL — AB (ref 8.9–10.3)
CHLORIDE: 106 mmol/L (ref 101–111)
CO2: 29 mmol/L (ref 22–32)
Creatinine, Ser: 0.73 mg/dL (ref 0.44–1.00)
GFR calc non Af Amer: >60 mL/min (ref >60)
Glucose, Bld: 119 mg/dL — ABNORMAL HIGH (ref 65–99)
POTASSIUM: 3.5 mmol/L (ref 3.5–5.1)
Sodium: 141 mmol/L (ref 135–145)

## 2017-11-10 LAB — CBC
HEMATOCRIT: 35.5 % (ref 35.0–47.0)
Hemoglobin: 12.8 g/dL (ref 12.0–16.0)
MCH: 32.4 pg (ref 26.0–34.0)
MCHC: 36 g/dL (ref 32.0–36.0)
MCV: 90 fL (ref 80.0–100.0)
Platelets: 314 10*3/uL (ref 150–440)
RBC: 3.94 MIL/uL (ref 3.80–5.20)
RDW: 13.2 % (ref 11.5–14.5)
WBC: 6.5 10*3/uL (ref 3.6–11.0)

## 2017-11-10 LAB — TROPONIN I

## 2017-11-10 NOTE — ED Triage Notes (Signed)
Pt reports chest heaviness since yesterday with sob.  Non smoker.  Pt also reports numbness in left arm.  Pt alert.  Speech clear.  Ambulates without diff.

## 2017-11-11 ENCOUNTER — Emergency Department
Admission: EM | Admit: 2017-11-11 | Discharge: 2017-11-11 | Disposition: A | Discharge status: Home / Self Care | Payer: Medicare Other | Attending: Emergency Medicine | Admitting: Emergency Medicine

## 2017-11-11 DIAGNOSIS — R001 Bradycardia, unspecified: Secondary | ICD-10-CM

## 2017-11-11 DIAGNOSIS — R0789 Other chest pain: Secondary | ICD-10-CM | POA: Diagnosis not present

## 2017-11-11 DIAGNOSIS — R079 Chest pain, unspecified: Secondary | ICD-10-CM

## 2017-11-11 LAB — TROPONIN I: Troponin I: <0.03 ng/mL

## 2017-11-11 MED ORDER — NITROGLYCERIN 0.4 MG SL SUBL
0.4000 mg | SUBLINGUAL_TABLET | SUBLINGUAL | Status: DC | PRN
  Administered 2017-11-11: 0.4 mg via SUBLINGUAL
  Filled 2017-11-11: qty 1

## 2017-11-11 NOTE — ED Notes (Signed)
ED Provider at bedside. 

## 2017-11-11 NOTE — ED Provider Notes (Signed)
[facility] Emergency Department Provider Note  ___________________________________________   First MD Initiated Contact with Patient 11/11/17 0153     (approximate)  I have reviewed the triage vital signs and the nursing notes.   HISTORY  Chief Complaint Chest Pain   HPI Alexis Christensen is a 47 y.o. female with a history of bipolar disorder as well as diastolic heart failure who is presenting with left-sided chest pain over the past 1-1/2 days which has been constant and is a 9 out of 10 at this time.  The patient describes the pain as a pressure over the left side of the chest.  She denies any worsening or relieving factors.  She denies any worsening with exertion.  Has tried Tylenol without relief.  Says that her left arm is also intermittently going numb and staying down for about half an hour.  She says that she has no numbness at this time.  Says that she has been experiencing shortness of breath associated with the chest pain but no nausea, vomiting or diaphoresis.  No history of heart disease.  Patient denies any smoking, drinking or drug use.  She does not know her family history secondary to being adopted.  Denies any heavy lifting or injury that could have caused the pain.  Past Medical History:  Diagnosis Date  . Adopted   . Anemia   . Anxiety   . Arthritis   . Bipolar 1 disorder ([place])   . Carpal tunnel syndrome 09/15/2014  . CHF (congestive heart failure) (HCC)    Dialostic CHF  . Chronic diarrhea 09/07/2015  . Chronic kidney disease    H/O KIDNEY STONES  . Chronic venous insufficiency 04/26/2016  . Depression   . DVT (deep venous thrombosis) ([place]) 2016   RIGHT LEG  . Edema leg   . Effusion of knee 12/30/2013  . Family history of adverse reaction to anesthesia    ADOPTED  . GERD (gastroesophageal reflux disease)   . 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 Headache(784.0)    MIGRAINES  . Heart burn   . Heart murmur   . History of kidney  stones   . HLD (hyperlipidemia)   . Hx MRSA infection   . Hypothyroidism   . Lipoma of arm 05/11/2013  . Lower extremity edema   . Lymphedema 04/26/2016  . Post traumatic stress disorder (PTSD)   . PVD (peripheral vascular disease) ([place])   . Seasonal allergies   . Stress incontinence   . Thyroid disease   . Urge incontinence     Patient Active Problem List   Diagnosis Date Noted  . Bilateral inguinal hernia without obstruction or gangrene   . Redness of eye, left 08/22/2016  . Iron deficiency anemia 07/11/2016  . ANA positive 06/13/2016  . Bilateral hand pain 06/13/2016  . Lymphedema 04/26/2016  . Chronic venous insufficiency 04/26/2016  . Swelling of limb 04/26/2016  . Chronic diarrhea 09/07/2015  . Fever 09/07/2015  . Hypothyroidism 09/07/2015  . Hypovitaminosis D 09/07/2015  . Obesity 09/07/2015  . Weight loss, unintentional 09/07/2015  . Bipolar mixed affective disorder, moderate ([place]) 09/05/2015  . SUI (stress urinary incontinence, female) 05/18/2015  . Pain in shoulder 09/15/2014  . Carpal tunnel syndrome 09/15/2014  . Chronic left shoulder pain 09/15/2014  . Effusion of knee 12/30/2013  . 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 Gonalgia 12/30/2013  . Lipoma of arm 05/11/2013    Past Surgical History:  Procedure Laterality Date  .  ABDOMINAL HYSTERECTOMY    . ANKLE SURGERY    . CHOLECYSTECTOMY    . CYSTOSCOPY N/A 04/09/2016   Procedure: CYSTOSCOPY;  Surgeon: Bjorn Loser, MD;  Location: [facility];  Service: Urology;  Laterality: N/A;  . DILATION AND CURETTAGE OF UTERUS    . INGUINAL HERNIA REPAIR Bilateral 04/18/2017   Procedure: LAPAROSCOPIC BILATERAL INGUINAL HERNIA REPAIR;  Surgeon: Jules Husbands, MD;  Location: [facility];  Service: General;  Laterality: Bilateral;  . INTERSTIM IMPLANT PLACEMENT N/A 11/26/2016   Procedure: Barrie Lyme IMPLANT FIRST STAGE;  Surgeon: Bjorn Loser, MD;  Location: [facility];  Service: Urology;  Laterality: N/A;  .  INTERSTIM IMPLANT PLACEMENT N/A 11/26/2016   Procedure: Barrie Lyme IMPLANT SECOND STAGE;  Surgeon: Bjorn Loser, MD;  Location: [facility];  Service: Urology;  Laterality: N/A;  . JOINT REPLACEMENT Right    knee  . KNEE ARTHROSCOPY Bilateral   . KNEE ARTHROSCOPY WITH LATERAL RELEASE Left 10/18/2015   Procedure: KNEE ARTHROSCOPY LATERAL AND PARTIAL SYNOVECTOMY;  Surgeon: Hessie Knows, MD;  Location: [facility];  Service: Orthopedics;  Laterality: Left;  . Lymph Node removal  2015   Neck  . PUBOVAGINAL SLING N/A 04/09/2016   Procedure: PUBO-VAGINAL SLING/ RETROPUBIC SLING;  Surgeon: Bjorn Loser, MD;  Location: [facility];  Service: Urology;  Laterality: N/A;  . REPLACEMENT TOTAL KNEE Right   . SHOULDER SURGERY Right 2014  . TUBAL LIGATION    . WRIST SURGERY Right    metal plate    Prior to Admission medications   Medication Sig Start Date End Date Taking? Authorizing Provider  ARIPiprazole (ABILIFY MAINTENA IM) Inject 400 mg into the muscle every 30 (thirty) days.    [provider]  clobetasol cream (TEMOVATE) 5.36 % Apply 1 application topically 2 (two) times daily as needed (for rash).     [provider]  colestipol (COLESTID) 1 g tablet Take 2 g by mouth daily at 12 noon.     [provider]  cyclobenzaprine (FLEXERIL) 10 MG tablet Take 10 mg by mouth 2 (two) times daily as needed for muscle spasms.  03/29/17   [provider]  diclofenac sodium (VOLTAREN) 1 % GEL Apply 1 application topically 2 (two) times daily.    [provider]  DULoxetine (CYMBALTA) 60 MG capsule Take 60 mg by mouth at bedtime.     [provider]  fluticasone (FLONASE) 50 MCG/ACT nasal spray Place 2 sprays into both nostrils daily.     [provider]  gabapentin (NEURONTIN) 600 MG tablet Take 600 mg by mouth 2 (two) times daily.     [provider]  hydrOXYzine (ATARAX/VISTARIL) 25 MG tablet Take 50 mg by mouth every 8 (eight) hours as needed  for itching.    [provider]  levocetirizine (XYZAL) 5 MG tablet Take 5 mg by mouth every evening.    [provider]  levothyroxine (SYNTHROID, LEVOTHROID) 150 MCG tablet Take 150 mcg by mouth daily before breakfast.  02/24/16   [provider]  prazosin (MINIPRESS) 2 MG capsule Take 2 mg by mouth at bedtime.    [provider]  ranitidine (ZANTAC) 150 MG tablet Take 150 mg by mouth 2 (two) times daily.    [provider]  SUBOXONE 8-2 MG FILM Dissolve 1 film under tongue twice daily 09/14/16   [provider]  traMADol (ULTRAM) 50 MG tablet Take 1 tablet (50 mg total) by mouth every 6 (six) hours as needed. Patient not taking: Reported on  10/21/2017 03/22/17 03/22/18  Earleen Newport, MD    Allergies Geodon [ziprasidone hydrochloride]; Sulfacetamide sodium; Ziprasidone hcl; Sulfa antibiotics; Ace inhibitors; Erythromycin; Hydromorphone; Lamictal [lamotrigine]; and Strawberry (diagnostic)  Family History  Adopted: Yes  Family history unknown: Yes    Social History Social History   Tobacco Use  . Smoking status: Current Some Day Smoker    Packs/day: 0.25    Years: 20.00    Pack years: 5.00    Types: Cigarettes    Last attempt to quit: 11/13/2012    Years since quitting: 4.9  . Smokeless tobacco: Never Used  . Tobacco comment: STRESS RELATED  Substance Use Topics  . Alcohol use: Yes    Alcohol/week: 0.0 oz    Comment: occasional  . Drug use: No    Review of Systems  Constitutional: No fever/chills Eyes: No visual changes. ENT: No sore throat. Cardiovascular: As above. Respiratory: As above Gastrointestinal: No abdominal pain.  No nausea, no vomiting.  No diarrhea.  No constipation. Genitourinary: Negative for dysuria. Musculoskeletal: Negative for back pain. Skin: Negative for rash. Neurological: Negative for headaches, focal weakness or numbness.   ____________________________________________   PHYSICAL  EXAM:  VITAL SIGNS: ED Triage Vitals [11/10/17 2009]  Enc Vitals Group     BP (!) 103/50     Pulse Rate (!) 52     Resp 18     Temp 98.4 F (36.9 C)     Temp Source Oral     SpO2 99 %     Weight 186 lb (84.4 kg)     Height 5\' 7"  (1.702 m)     Head Circumference      Peak Flow      Pain Score 7     Pain Loc      Pain Edu?      Excl. in [place]?     Constitutional: Alert and oriented. Well appearing and in no acute distress. Eyes: Conjunctivae are normal.  Head: Atraumatic. Nose: No congestion/rhinnorhea. Mouth/Throat: Mucous membranes are moist.  Neck: No stridor.   Cardiovascular: Normal rate, regular rhythm. Grossly normal heart sounds.  Good peripheral circulation with equal and bilateral radial pulses.  Chest pain is reproducible to palpation over the left pectoralis major muscle. Respiratory: Normal respiratory effort.  No retractions. Lungs CTAB. Gastrointestinal: Soft and nontender. No distention. Musculoskeletal: No lower extremity tenderness nor edema.  No joint effusions. Neurologic:  Normal speech and language. No gross focal neurologic deficits are appreciated. Skin:  Skin is warm, dry and intact. No rash noted. Psychiatric: Mood and affect are normal. Speech and behavior are normal.  ____________________________________________   LABS (all labs ordered are listed, but only abnormal results are displayed)  Labs Reviewed  BASIC METABOLIC PANEL - Abnormal; Notable for the following components:      Result Value   Glucose, Bld 119 (*)    Calcium 8.4 (*)    All other components within normal limits  CBC  TROPONIN I  TROPONIN I   ____________________________________________  EKG  ED ECG REPORT I, Doran Stabler, the attending physician, personally viewed and interpreted this ECG.   Date: 11/11/2017  EKG Time: 2005  Rate: 52  Rhythm: sinus bradycardia  Axis: normal  Intervals:none  ST&T Change: no st segment elevation or depression.  No abnormal t  wave inversion.    ____________________________________________  RADIOLOGY  No acute finding.   ____________________________________________   PROCEDURES  Procedure(s) performed:   Procedures  Critical Care performed:   ____________________________________________  INITIAL IMPRESSION / ASSESSMENT AND PLAN / ED COURSE  Pertinent labs & imaging results that were available during my care of the patient were reviewed by me and considered in my medical decision making (see chart for details).  Differential diagnosis includes, but is not limited to, ACS, aortic dissection, pulmonary embolism, cardiac tamponade, pneumothorax, pneumonia, pericarditis, myocarditis, GI-related causes including esophagitis/gastritis, and musculoskeletal chest wall pain.   As part of my medical decision making, I reviewed the following data within the electronic MEDICAL RECORD NUMBER Notes from prior ED visits  ----------------------------------------- 3:11 AM on 11/11/2017 -----------------------------------------  Patient without any distress at this time.  Heart rate in the 40s to 50s.  On review of past notes it appears the patient has previously had bradycardia to this range.  2- troponins.  Pain not relieved with nitroglycerin.  Reproducible to palpation.  Atypical for ACS.  Unlikely to be PE.  Patient I believe is appropriate for outpatient follow-up with cardiologist.  She will continue with Tylenol and will also try Aspercreme or icy hot to the affected area.  [facility]  She is understanding of the diagnosis as well as treatment plan willing to comply. ____________________________________________   FINAL CLINICAL IMPRESSION(S) / ED DIAGNOSES  Chest pain.  Bradycardia.    NEW MEDICATIONS STARTED DURING THIS VISIT:  New Prescriptions   No medications on file     Note:  This document was prepared using Dragon voice recognition software and may include unintentional  dictation errors.     Orbie Pyo, MD 11/11/17 [phone]

## 2017-12-13 ENCOUNTER — Other Ambulatory Visit: Payer: Self-pay | Admitting: Family Medicine

## 2017-12-13 ENCOUNTER — Ambulatory Visit
Admission: RE | Admit: 2017-12-13 | Discharge: 2017-12-13 | Disposition: A | Discharge status: Home / Self Care | Payer: Disability Insurance | Source: Ambulatory Visit | Attending: Family Medicine | Admitting: Family Medicine

## 2017-12-13 DIAGNOSIS — Z8739 Personal history of other diseases of the musculoskeletal system and connective tissue: Secondary | ICD-10-CM

## 2017-12-13 DIAGNOSIS — Z96651 Presence of right artificial knee joint: Secondary | ICD-10-CM | POA: Diagnosis not present

## 2017-12-23 ENCOUNTER — Emergency Department: Payer: Medicare Other

## 2017-12-23 ENCOUNTER — Emergency Department
Admission: EM | Admit: 2017-12-23 | Discharge: 2017-12-23 | Disposition: A | Discharge status: Home / Self Care | Payer: Medicare Other | Attending: Emergency Medicine | Admitting: Emergency Medicine

## 2017-12-23 ENCOUNTER — Other Ambulatory Visit: Payer: Self-pay

## 2017-12-23 ENCOUNTER — Encounter: Payer: Self-pay | Admitting: Emergency Medicine

## 2017-12-23 DIAGNOSIS — F319 Bipolar disorder, unspecified: Secondary | ICD-10-CM | POA: Diagnosis not present

## 2017-12-23 DIAGNOSIS — Y9389 Activity, other specified: Secondary | ICD-10-CM | POA: Insufficient documentation

## 2017-12-23 DIAGNOSIS — F419 Anxiety disorder, unspecified: Secondary | ICD-10-CM | POA: Diagnosis not present

## 2017-12-23 DIAGNOSIS — Z96651 Presence of right artificial knee joint: Secondary | ICD-10-CM | POA: Insufficient documentation

## 2017-12-23 DIAGNOSIS — Z79899 Other long term (current) drug therapy: Secondary | ICD-10-CM | POA: Diagnosis not present

## 2017-12-23 DIAGNOSIS — S8992XA Unspecified injury of left lower leg, initial encounter: Secondary | ICD-10-CM

## 2017-12-23 DIAGNOSIS — F17210 Nicotine dependence, cigarettes, uncomplicated: Secondary | ICD-10-CM | POA: Insufficient documentation

## 2017-12-23 DIAGNOSIS — N189 Chronic kidney disease, unspecified: Secondary | ICD-10-CM | POA: Insufficient documentation

## 2017-12-23 DIAGNOSIS — I130 Hypertensive heart and chronic kidney disease with heart failure and stage 1 through stage 4 chronic kidney disease, or unspecified chronic kidney disease: Secondary | ICD-10-CM | POA: Insufficient documentation

## 2017-12-23 DIAGNOSIS — E039 Hypothyroidism, unspecified: Secondary | ICD-10-CM | POA: Insufficient documentation

## 2017-12-23 DIAGNOSIS — I5030 Unspecified diastolic (congestive) heart failure: Secondary | ICD-10-CM | POA: Diagnosis not present

## 2017-12-23 DIAGNOSIS — Y998 Other external cause status: Secondary | ICD-10-CM | POA: Diagnosis not present

## 2017-12-23 DIAGNOSIS — Y929 Unspecified place or not applicable: Secondary | ICD-10-CM | POA: Diagnosis not present

## 2017-12-23 DIAGNOSIS — W109XXA Fall (on) (from) unspecified stairs and steps, initial encounter: Secondary | ICD-10-CM | POA: Diagnosis not present

## 2017-12-23 MED ORDER — DICLOFENAC SODIUM 1 % TD GEL: 2.0000 g | Freq: Four times a day (QID) | TRANSDERMAL | 0 refills | Status: DC

## 2017-12-23 NOTE — ED Provider Notes (Signed)
[facility] Emergency Department Provider Note  ____________________________________________  Time seen: Approximately 6:17 PM  I have reviewed the triage vital signs and the nursing notes.   HISTORY  Chief Complaint Knee Pain    HPI Alexis Christensen is a 47 y.o. female that presents to the emergency department for evaluation of left knee pain after falling down 4-5 steps this afternoon.  Patient states that her foot got caught, which caused her to fall.  She landed on her left knee.  She did not hit her head or lose consciousness.  No additional injuries.  She has been walking but with pain.  Pain increases when she puts pressure on the leg.  No numbness, tingling.   Past Medical History:  Diagnosis Date  . Adopted   . Anemia   . Anxiety   . Arthritis   . Bipolar 1 disorder ([place])   . Carpal tunnel syndrome 09/15/2014  . CHF (congestive heart failure) (HCC)    Dialostic CHF  . Chronic diarrhea 09/07/2015  . Chronic kidney disease    H/O KIDNEY STONES  . Chronic venous insufficiency 04/26/2016  . Depression   . DVT (deep venous thrombosis) ([place]) 2016   RIGHT LEG  . Edema leg   . Effusion of knee 12/30/2013  . Family history of adverse reaction to anesthesia    ADOPTED  . GERD (gastroesophageal reflux disease)   . 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 Headache(784.0)    MIGRAINES  . Heart burn   . Heart murmur   . History of kidney stones   . HLD (hyperlipidemia)   . Hx MRSA infection   . Hypothyroidism   . Lipoma of arm 05/11/2013  . Lower extremity edema   . Lymphedema 04/26/2016  . Post traumatic stress disorder (PTSD)   . PVD (peripheral vascular disease) ([place])   . Seasonal allergies   . Stress incontinence   . Thyroid disease   . Urge incontinence     Patient Active Problem List   Diagnosis Date Noted  . Bilateral inguinal hernia without obstruction or gangrene   . Redness of eye, left 08/22/2016  . Iron deficiency anemia  07/11/2016  . ANA positive 06/13/2016  . Bilateral hand pain 06/13/2016  . Lymphedema 04/26/2016  . Chronic venous insufficiency 04/26/2016  . Swelling of limb 04/26/2016  . Chronic diarrhea 09/07/2015  . Fever 09/07/2015  . Hypothyroidism 09/07/2015  . Hypovitaminosis D 09/07/2015  . Obesity 09/07/2015  . Weight loss, unintentional 09/07/2015  . Bipolar mixed affective disorder, moderate ([place]) 09/05/2015  . SUI (stress urinary incontinence, female) 05/18/2015  . Pain in shoulder 09/15/2014  . Carpal tunnel syndrome 09/15/2014  . Chronic left shoulder pain 09/15/2014  . Effusion of knee 12/30/2013  . 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 Gonalgia 12/30/2013  . Lipoma of arm 05/11/2013    Past Surgical History:  Procedure Laterality Date  . ABDOMINAL HYSTERECTOMY    . ANKLE SURGERY    . CHOLECYSTECTOMY    . CYSTOSCOPY N/A 04/09/2016   Procedure: CYSTOSCOPY;  Surgeon: Bjorn Loser, MD;  Location: [facility] ORS;  Service: Urology;  Laterality: N/A;  . DILATION AND CURETTAGE OF UTERUS    . INGUINAL HERNIA REPAIR Bilateral 04/18/2017   Procedure: LAPAROSCOPIC BILATERAL INGUINAL HERNIA REPAIR;  Surgeon: Jules Husbands, MD;  Location: [facility] ORS;  Service: General;  Laterality: Bilateral;  . INTERSTIM IMPLANT PLACEMENT N/A 11/26/2016   Procedure: Barrie Lyme IMPLANT FIRST STAGE;  Surgeon: Bjorn Loser, MD;  Location: [facility] ORS;  Service: Urology;  Laterality: N/A;  . INTERSTIM IMPLANT PLACEMENT N/A 11/26/2016   Procedure: Barrie Lyme IMPLANT SECOND STAGE;  Surgeon: Bjorn Loser, MD;  Location: [facility] ORS;  Service: Urology;  Laterality: N/A;  . JOINT REPLACEMENT Right    knee  . KNEE ARTHROSCOPY Bilateral   . KNEE ARTHROSCOPY WITH LATERAL RELEASE Left 10/18/2015   Procedure: KNEE ARTHROSCOPY LATERAL AND PARTIAL SYNOVECTOMY;  Surgeon: Hessie Knows, MD;  Location: [facility] ORS;  Service: Orthopedics;  Laterality: Left;  . Lymph Node removal  2015   Neck  . PUBOVAGINAL SLING N/A 04/09/2016    Procedure: PUBO-VAGINAL SLING/ RETROPUBIC SLING;  Surgeon: Bjorn Loser, MD;  Location: [facility] ORS;  Service: Urology;  Laterality: N/A;  . REPLACEMENT TOTAL KNEE Right   . SHOULDER SURGERY Right 2014  . TUBAL LIGATION    . WRIST SURGERY Right    metal plate    Prior to Admission medications   Medication Sig Start Date End Date Taking? Authorizing Provider  ARIPiprazole (ABILIFY MAINTENA IM) Inject 400 mg into the muscle every 30 (thirty) days.    [provider]  clobetasol cream (TEMOVATE) 7.89 % Apply 1 application topically 2 (two) times daily as needed (for rash).     [provider]  colestipol (COLESTID) 1 g tablet Take 2 g by mouth daily at 12 noon.     [provider]  cyclobenzaprine (FLEXERIL) 10 MG tablet Take 10 mg by mouth 2 (two) times daily as needed for muscle spasms.  03/29/17   [provider]  diclofenac sodium (VOLTAREN) 1 % GEL Apply 2 g topically 4 (four) times daily. 12/23/17   Laban Emperor, PA-C  DULoxetine (CYMBALTA) 60 MG capsule Take 60 mg by mouth at bedtime.     [provider]  fluticasone (FLONASE) 50 MCG/ACT nasal spray Place 2 sprays into both nostrils daily.     [provider]  gabapentin (NEURONTIN) 600 MG tablet Take 600 mg by mouth 2 (two) times daily.     [provider]  hydrOXYzine (ATARAX/VISTARIL) 25 MG tablet Take 50 mg by mouth every 8 (eight) hours as needed for itching.    [provider]  levocetirizine (XYZAL) 5 MG tablet Take 5 mg by mouth every evening.    [provider]  levothyroxine (SYNTHROID, LEVOTHROID) 150 MCG tablet Take 150 mcg by mouth daily before breakfast.  02/24/16   [provider]  prazosin (MINIPRESS) 2 MG capsule Take 2 mg by mouth at bedtime.    [provider]  ranitidine (ZANTAC) 150 MG tablet Take 150 mg by mouth 2 (two) times daily.    [provider]  SUBOXONE 8-2 MG FILM Dissolve 1 film under tongue twice  daily 09/14/16   [provider]  traMADol (ULTRAM) 50 MG tablet Take 1 tablet (50 mg total) by mouth every 6 (six) hours as needed. Patient not taking: Reported on 10/21/2017 03/22/17 03/22/18  Earleen Newport, MD    Allergies Geodon [ziprasidone hydrochloride]; Sulfacetamide sodium; Ziprasidone hcl; Sulfa antibiotics; Ace inhibitors; Erythromycin; Hydromorphone; Lamictal [lamotrigine]; and Strawberry (diagnostic)  Family History  Adopted: Yes  Family history unknown: Yes    Social History Social History   Tobacco Use  . Smoking status: Current Some Day Smoker    Packs/day: 0.25    Years: 20.00    Pack years: 5.00    Types: Cigarettes    Last attempt to quit: 11/13/2012    Years since quitting: 5.1  . Smokeless  tobacco: Never Used  . Tobacco comment: STRESS RELATED  Substance Use Topics  . Alcohol use: Yes    Alcohol/week: 0.0 oz    Comment: occasional  . Drug use: No     Review of Systems  Gastrointestinal: No nausea, no vomiting.  Musculoskeletal: Positive for knee pain.  Skin: Negative for rash, abrasions, lacerations, ecchymosis. Neurological: Negative for headaches, numbness or tingling   ____________________________________________   PHYSICAL EXAM:  VITAL SIGNS: ED Triage Vitals  Enc Vitals Group     BP 12/23/17 1647 95/63     Pulse Rate 12/23/17 1647 60     Resp 12/23/17 1647 18     Temp 12/23/17 1647 98 F (36.7 C)     Temp Source 12/23/17 1647 Oral     SpO2 12/23/17 1647 95 %     Weight 12/23/17 1649 180 lb (81.6 kg)     Height 12/23/17 1649 5\' 5"  (1.651 m)     Head Circumference --      Peak Flow --      Pain Score 12/23/17 1649 6     Pain Loc --      Pain Edu? --      Excl. in [place]? --      Constitutional: Alert and oriented. Well appearing and in no acute distress. Eyes: Conjunctivae are normal. PERRL. EOMI. Head: Atraumatic. ENT:      Ears:      Nose: No congestion/rhinnorhea.      Mouth/Throat: Mucous membranes are moist.   Neck: No stridor.  Cardiovascular: Normal rate, regular rhythm.  Good peripheral circulation. Respiratory: Normal respiratory effort without tachypnea or retractions. Lungs CTAB. Good air entry to the bases with no decreased or absent breath sounds. Musculoskeletal: Full range of motion to all extremities. No gross deformities appreciated. Tenderness to palpation to inferior patella. No visible swelling or ecchymosis. No effusion noted. Negative anterior drawer, posterior drawer, valgus, varus, mcMurray, patella apprehension, apley grind. Neurologic:  Normal speech and language. No gross focal neurologic deficits are appreciated.  Skin:  Skin is warm, dry and intact. No rash noted. Psychiatric: Mood and affect are normal. Speech and behavior are normal. Patient exhibits appropriate insight and judgement.   ____________________________________________   LABS (all labs ordered are listed, but only abnormal results are displayed)  Labs Reviewed - No data to display ____________________________________________  EKG   ____________________________________________  RADIOLOGY Robinette Haines, personally viewed and evaluated these images (plain radiographs) as part of my medical decision making, as well as reviewing the written report by the radiologist.  Dg Knee Complete 4 Views Left  Result Date: 12/23/2017 CLINICAL DATA:  [place] off steps landing on LEFT knee. EXAM: LEFT KNEE - COMPLETE 4+ VIEW COMPARISON:  LEFT knee radiograph Dec 13, 2017 FINDINGS: No acute fracture deformity or dislocation. Stable tibial spine speaking. Mild medial compartment marginal spurring. Patellar enthesopathy. No destructive bony lesions. Soft tissue planes are not suspicious. IMPRESSION: 1. No acute fracture deformity or dislocation. 2. Stable mild medial compartment osteoarthrosis. Electronically Signed   By: Elon Alas M.D.   On: 12/23/2017 17:48     ____________________________________________    PROCEDURES  Procedure(s) performed:    Procedures    Medications - No data to display   ____________________________________________   INITIAL IMPRESSION / ASSESSMENT AND PLAN / ED COURSE  Pertinent labs & imaging results that were available during my care of the patient were reviewed by me and considered in my medical decision making (see chart for details).  Review of the [place] CSRS was performed in accordance of the [place] prior to dispensing any controlled drugs.   Patient presented to the emergency department for evaluation of knee injury.  Vital signs and exam are reassuring.  Knee x-ray negative for acute bony abnormalities.  All findings were discussed with patient.  Knee was ace wrapped and crutches were given.  Patient will be discharged home with prescriptions for meloxicam. Patient is to follow up with PCP as directed. Patient is given ED precautions to return to the ED for any worsening or new symptoms.     ____________________________________________  FINAL CLINICAL IMPRESSION(S) / ED DIAGNOSES  Final diagnoses:  Injury of left knee, initial encounter      NEW MEDICATIONS STARTED DURING THIS VISIT:  ED Discharge Orders        Ordered    diclofenac sodium (VOLTAREN) 1 % GEL  4 times daily     12/23/17 1851          This chart was dictated using voice recognition software/Dragon. Despite best efforts to proofread, errors can occur which can change the meaning. Any change was purely unintentional.    Laban Emperor, PA-C 12/23/17 1926    Darel Hong, MD 12/23/17 2316

## 2017-12-23 NOTE — ED Notes (Signed)
See triage note  States she fell down steps   Landed on left knee  No deformity noted  Ambulates with slight limp d/t pain

## 2017-12-23 NOTE — ED Triage Notes (Signed)
Fell on stairs and twisted L knee approx 3p.

## 2017-12-27 ENCOUNTER — Other Ambulatory Visit: Payer: Self-pay | Admitting: Nurse Practitioner

## 2017-12-27 DIAGNOSIS — M5440 Lumbago with sciatica, unspecified side: Secondary | ICD-10-CM

## 2018-01-14 ENCOUNTER — Ambulatory Visit: Payer: Disability Insurance

## 2018-01-15 ENCOUNTER — Ambulatory Visit
Admission: RE | Admit: 2018-01-15 | Discharge: 2018-01-15 | Disposition: A | Discharge status: Home / Self Care | Payer: Disability Insurance | Source: Ambulatory Visit | Attending: Nurse Practitioner | Admitting: Nurse Practitioner

## 2018-01-25 ENCOUNTER — Encounter: Payer: Self-pay | Admitting: Emergency Medicine

## 2018-01-25 ENCOUNTER — Emergency Department
Admission: EM | Admit: 2018-01-25 | Discharge: 2018-01-26 | Disposition: A | Discharge status: Home / Self Care | Payer: Medicare Other | Attending: Emergency Medicine | Admitting: Emergency Medicine

## 2018-01-25 ENCOUNTER — Other Ambulatory Visit: Payer: Self-pay

## 2018-01-25 DIAGNOSIS — Z79899 Other long term (current) drug therapy: Secondary | ICD-10-CM | POA: Diagnosis not present

## 2018-01-25 DIAGNOSIS — E039 Hypothyroidism, unspecified: Secondary | ICD-10-CM | POA: Diagnosis not present

## 2018-01-25 DIAGNOSIS — F419 Anxiety disorder, unspecified: Secondary | ICD-10-CM | POA: Insufficient documentation

## 2018-01-25 DIAGNOSIS — F17210 Nicotine dependence, cigarettes, uncomplicated: Secondary | ICD-10-CM | POA: Diagnosis not present

## 2018-01-25 DIAGNOSIS — Z96651 Presence of right artificial knee joint: Secondary | ICD-10-CM | POA: Insufficient documentation

## 2018-01-25 DIAGNOSIS — R112 Nausea with vomiting, unspecified: Secondary | ICD-10-CM

## 2018-01-25 DIAGNOSIS — F319 Bipolar disorder, unspecified: Secondary | ICD-10-CM | POA: Diagnosis not present

## 2018-01-25 DIAGNOSIS — N189 Chronic kidney disease, unspecified: Secondary | ICD-10-CM | POA: Insufficient documentation

## 2018-01-25 DIAGNOSIS — I5030 Unspecified diastolic (congestive) heart failure: Secondary | ICD-10-CM | POA: Insufficient documentation

## 2018-01-25 DIAGNOSIS — Z9049 Acquired absence of other specified parts of digestive tract: Secondary | ICD-10-CM | POA: Insufficient documentation

## 2018-01-25 LAB — CBC
HCT: 37.7 % (ref 35.0–47.0)
Hemoglobin: 12.8 g/dL (ref 12.0–16.0)
MCH: 30.9 pg (ref 26.0–34.0)
MCHC: 34 g/dL (ref 32.0–36.0)
MCV: 90.8 fL (ref 80.0–100.0)
PLATELETS: 246 10*3/uL (ref 150–440)
RBC: 4.15 MIL/uL (ref 3.80–5.20)
RDW: 13.1 % (ref 11.5–14.5)
WBC: 5.7 10*3/uL (ref 3.6–11.0)

## 2018-01-25 LAB — COMPREHENSIVE METABOLIC PANEL
ALBUMIN: 4.1 g/dL (ref 3.5–5.0)
ALK PHOS: 65 U/L (ref 38–126)
ALT: 17 U/L (ref 0–44)
AST: 20 U/L (ref 15–41)
Anion gap: 7 (ref 5–15)
BUN: 14 mg/dL (ref 6–20)
CALCIUM: 8.7 mg/dL — AB (ref 8.9–10.3)
CO2: 29 mmol/L (ref 22–32)
CREATININE: 0.61 mg/dL (ref 0.44–1.00)
Chloride: 105 mmol/L (ref 98–111)
GFR calc Af Amer: >60 mL/min (ref >60)
GFR calc non Af Amer: >60 mL/min (ref >60)
GLUCOSE: 105 mg/dL — AB (ref 70–99)
Potassium: 3.6 mmol/L (ref 3.5–5.1)
SODIUM: 141 mmol/L (ref 135–145)
Total Bilirubin: 0.6 mg/dL (ref 0.3–1.2)
Total Protein: 6.9 g/dL (ref 6.5–8.1)

## 2018-01-25 LAB — LIPASE, BLOOD: Lipase: 24 U/L (ref 11–51)

## 2018-01-25 LAB — URINALYSIS, COMPLETE (UACMP) WITH MICROSCOPIC
Bacteria, UA: NONE SEEN
Bilirubin Urine: NEGATIVE
GLUCOSE, UA: NEGATIVE mg/dL
Hgb urine dipstick: NEGATIVE
KETONES UR: NEGATIVE mg/dL
Leukocytes, UA: NEGATIVE
Nitrite: NEGATIVE
PH: 5 (ref 5.0–8.0)
Protein, ur: NEGATIVE mg/dL
Specific Gravity, Urine: 1.025 (ref 1.005–1.030)

## 2018-01-25 MED ORDER — SODIUM CHLORIDE 0.9 % IV BOLUS
1000.0000 mL | Freq: Once | INTRAVENOUS | Status: AC
  Administered 2018-01-25: 1000 mL via INTRAVENOUS

## 2018-01-25 MED ORDER — ONDANSETRON 4 MG PO TBDP: 4.0000 mg | ORAL_TABLET | Freq: Three times a day (TID) | ORAL | 0 refills | Status: DC | PRN

## 2018-01-25 MED ORDER — ONDANSETRON HCL 4 MG/2ML IJ SOLN
4.0000 mg | Freq: Once | INTRAMUSCULAR | Status: AC
  Administered 2018-01-25: 4 mg via INTRAVENOUS
  Filled 2018-01-25: qty 2

## 2018-01-25 NOTE — ED Triage Notes (Signed)
C/O vomiting since 0300 this morning.  Able to keep small amounts of water down, but otherwise not tolerating PO intake. Denies c/o pain.

## 2018-01-25 NOTE — ED Provider Notes (Signed)
[facility] Emergency Department Provider Note   ____________________________________________   First MD Initiated Contact with Patient 01/25/18 2314     (approximate)  I have reviewed the triage vital signs and the nursing notes.   HISTORY  Chief Complaint Emesis    HPI Alexis Christensen is a 47 y.o. female who presents to the ED from home with a chief complaint of nausea and vomiting.  Patient awoke at 3 AM with symptoms.  The day prior patient had multiple episodes of diarrhea.  Remembers eating [employer] prior to vomiting.  Last episode of emesis approximately 5 hours ago.  Denies associated fever, chills, chest pain, shortness of breath, abdominal pain, dysuria.  Denies recent travel or trauma.   Past Medical History:  Diagnosis Date  . Adopted   . Anemia   . Anxiety   . Arthritis   . Bipolar 1 disorder ([place])   . Carpal tunnel syndrome 09/15/2014  . CHF (congestive heart failure) (HCC)    Dialostic CHF  . Chronic diarrhea 09/07/2015  . Chronic kidney disease    H/O KIDNEY STONES  . Chronic venous insufficiency 04/26/2016  . Depression   . DVT (deep venous thrombosis) ([place]) 2016   RIGHT LEG  . Edema leg   . Effusion of knee 12/30/2013  . Family history of adverse reaction to anesthesia    ADOPTED  . GERD (gastroesophageal reflux disease)   . 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 Headache(784.0)    MIGRAINES  . Heart burn   . Heart murmur   . History of kidney stones   . HLD (hyperlipidemia)   . Hx MRSA infection   . Hypothyroidism   . Lipoma of arm 05/11/2013  . Lower extremity edema   . Lymphedema 04/26/2016  . Post traumatic stress disorder (PTSD)   . PVD (peripheral vascular disease) ([place])   . Seasonal allergies   . Stress incontinence   . Thyroid disease   . Urge incontinence     Patient Active Problem List   Diagnosis Date Noted  . Bilateral inguinal hernia without obstruction or gangrene   . Redness of eye, left  08/22/2016  . Iron deficiency anemia 07/11/2016  . ANA positive 06/13/2016  . Bilateral hand pain 06/13/2016  . Lymphedema 04/26/2016  . Chronic venous insufficiency 04/26/2016  . Swelling of limb 04/26/2016  . Chronic diarrhea 09/07/2015  . Fever 09/07/2015  . Hypothyroidism 09/07/2015  . Hypovitaminosis D 09/07/2015  . Obesity 09/07/2015  . Weight loss, unintentional 09/07/2015  . Bipolar mixed affective disorder, moderate ([place]) 09/05/2015  . SUI (stress urinary incontinence, female) 05/18/2015  . Pain in shoulder 09/15/2014  . Carpal tunnel syndrome 09/15/2014  . Chronic left shoulder pain 09/15/2014  . Effusion of knee 12/30/2013  . 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 Gonalgia 12/30/2013  . Lipoma of arm 05/11/2013    Past Surgical History:  Procedure Laterality Date  . ABDOMINAL HYSTERECTOMY    . ANKLE SURGERY    . CHOLECYSTECTOMY    . CYSTOSCOPY N/A 04/09/2016   Procedure: CYSTOSCOPY;  Surgeon: Bjorn Loser, MD;  Location: [facility] ORS;  Service: Urology;  Laterality: N/A;  . DILATION AND CURETTAGE OF UTERUS    . INGUINAL HERNIA REPAIR Bilateral 04/18/2017   Procedure: LAPAROSCOPIC BILATERAL INGUINAL HERNIA REPAIR;  Surgeon: Jules Husbands, MD;  Location: [facility] ORS;  Service: General;  Laterality: Bilateral;  . INTERSTIM IMPLANT PLACEMENT N/A 11/26/2016   Procedure: Barrie Lyme IMPLANT FIRST STAGE;  Surgeon: Matilde Sprang,  Nicki Reaper, MD;  Location: [facility] ORS;  Service: Urology;  Laterality: N/A;  . INTERSTIM IMPLANT PLACEMENT N/A 11/26/2016   Procedure: Barrie Lyme IMPLANT SECOND STAGE;  Surgeon: Bjorn Loser, MD;  Location: [facility] ORS;  Service: Urology;  Laterality: N/A;  . JOINT REPLACEMENT Right    knee  . KNEE ARTHROSCOPY Bilateral   . KNEE ARTHROSCOPY WITH LATERAL RELEASE Left 10/18/2015   Procedure: KNEE ARTHROSCOPY LATERAL AND PARTIAL SYNOVECTOMY;  Surgeon: Hessie Knows, MD;  Location: [facility] ORS;  Service: Orthopedics;  Laterality: Left;  . Lymph Node removal  2015    Neck  . PUBOVAGINAL SLING N/A 04/09/2016   Procedure: PUBO-VAGINAL SLING/ RETROPUBIC SLING;  Surgeon: Bjorn Loser, MD;  Location: [facility] ORS;  Service: Urology;  Laterality: N/A;  . REPLACEMENT TOTAL KNEE Right   . SHOULDER SURGERY Right 2014  . TUBAL LIGATION    . WRIST SURGERY Right    metal plate    Prior to Admission medications   Medication Sig Start Date End Date Taking? Authorizing Provider  ARIPiprazole (ABILIFY MAINTENA IM) Inject 400 mg into the muscle every 30 (thirty) days.    [provider]  clobetasol cream (TEMOVATE) 3.79 % Apply 1 application topically 2 (two) times daily as needed (for rash).     [provider]  colestipol (COLESTID) 1 g tablet Take 2 g by mouth daily at 12 noon.     [provider]  cyclobenzaprine (FLEXERIL) 10 MG tablet Take 10 mg by mouth 2 (two) times daily as needed for muscle spasms.  03/29/17   [provider]  diclofenac sodium (VOLTAREN) 1 % GEL Apply 2 g topically 4 (four) times daily. 12/23/17   Laban Emperor, PA-C  DULoxetine (CYMBALTA) 60 MG capsule Take 60 mg by mouth at bedtime.     [provider]  fluticasone (FLONASE) 50 MCG/ACT nasal spray Place 2 sprays into both nostrils daily.     [provider]  gabapentin (NEURONTIN) 600 MG tablet Take 600 mg by mouth 2 (two) times daily.     [provider]  hydrOXYzine (ATARAX/VISTARIL) 25 MG tablet Take 50 mg by mouth every 8 (eight) hours as needed for itching.    [provider]  levocetirizine (XYZAL) 5 MG tablet Take 5 mg by mouth every evening.    [provider]  levothyroxine (SYNTHROID, LEVOTHROID) 150 MCG tablet Take 150 mcg by mouth daily before breakfast.  02/24/16   [provider]  ondansetron (ZOFRAN ODT) 4 MG disintegrating tablet Take 1 tablet (4 mg total) by mouth every 8 (eight) hours as needed for nausea or vomiting. 01/25/18   Paulette Blanch, MD  prazosin (MINIPRESS) 2 MG capsule Take 2 mg  by mouth at bedtime.    [provider]  ranitidine (ZANTAC) 150 MG tablet Take 150 mg by mouth 2 (two) times daily.    [provider]  SUBOXONE 8-2 MG FILM Dissolve 1 film under tongue twice daily 09/14/16   [provider]  traMADol (ULTRAM) 50 MG tablet Take 1 tablet (50 mg total) by mouth every 6 (six) hours as needed. Patient not taking: Reported on 10/21/2017 03/22/17 03/22/18  Earleen Newport, MD    Allergies Geodon [ziprasidone hydrochloride]; Sulfacetamide sodium; Ziprasidone hcl; Sulfa antibiotics; Ace inhibitors; Erythromycin; Hydromorphone; Lamictal [lamotrigine]; and Strawberry (diagnostic)  Family History  Adopted: Yes  Family history unknown: Yes    Social History Social History   Tobacco Use  . Smoking status: Current Some Day Smoker  Packs/day: 0.25    Years: 20.00    Pack years: 5.00    Types: Cigarettes    Last attempt to quit: 11/13/2012    Years since quitting: 5.2  . Smokeless tobacco: Never Used  . Tobacco comment: STRESS RELATED  Substance Use Topics  . Alcohol use: Yes    Alcohol/week: 0.0 oz    Comment: occasional  . Drug use: No    Review of Systems  Constitutional: No fever/chills Eyes: No visual changes. ENT: No sore throat. Cardiovascular: Denies chest pain. Respiratory: Denies shortness of breath. Gastrointestinal: No abdominal pain.  Positive for nausea and vomiting.  No diarrhea.  No constipation. Genitourinary: Negative for dysuria. Musculoskeletal: Negative for back pain. Skin: Negative for rash. Neurological: Negative for headaches, focal weakness or numbness.   ____________________________________________   PHYSICAL EXAM:  VITAL SIGNS: ED Triage Vitals  Enc Vitals Group     BP 01/25/18 1655 115/64     Pulse Rate 01/25/18 1655 71     Resp 01/25/18 1655 16     Temp 01/25/18 1655 98.3 F (36.8 C)     Temp Source 01/25/18 1655 Oral     SpO2 01/25/18 1655 95 %     Weight 01/25/18 1656 180 lb (81.6  kg)     Height 01/25/18 1656 5\' 5"  (1.651 m)     Head Circumference --      Peak Flow --      Pain Score 01/25/18 1656 0     Pain Loc --      Pain Edu? --      Excl. in [place]? --     Constitutional: Alert and oriented. Well appearing and in no acute distress. Eyes: Conjunctivae are normal. PERRL. EOMI. Head: Atraumatic. Nose: No congestion/rhinnorhea. Mouth/Throat: Mucous membranes are moist.  Oropharynx non-erythematous. Neck: No stridor.   Cardiovascular: Normal rate, regular rhythm. Grossly normal heart sounds.  Good peripheral circulation. Respiratory: Normal respiratory effort.  No retractions. Lungs CTAB. Gastrointestinal: Soft and nontender to light or deep palpation. No distention. No abdominal bruits. No CVA tenderness. Musculoskeletal: No lower extremity tenderness nor edema.  No joint effusions. Neurologic:  Normal speech and language. No gross focal neurologic deficits are appreciated. No gait instability. Skin:  Skin is warm, dry and intact. No rash noted. Psychiatric: Mood and affect are normal. Speech and behavior are normal.  ____________________________________________   LABS (all labs ordered are listed, but only abnormal results are displayed)  Labs Reviewed  COMPREHENSIVE METABOLIC PANEL - Abnormal; Notable for the following components:      Result Value   Glucose, Bld 105 (*)    Calcium 8.7 (*)    All other components within normal limits  URINALYSIS, COMPLETE (UACMP) WITH MICROSCOPIC - Abnormal; Notable for the following components:   Color, Urine YELLOW (*)    APPearance HAZY (*)    All other components within normal limits  LIPASE, BLOOD  CBC   ____________________________________________  EKG  None ____________________________________________  RADIOLOGY  ED MD interpretation: None  Official radiology report(s): No results found.  ____________________________________________   PROCEDURES  Procedure(s) performed:  None  Procedures  Critical Care performed: No  ____________________________________________   INITIAL IMPRESSION / ASSESSMENT AND PLAN / ED COURSE  As part of my medical decision making, I reviewed the following data within the [facility] notes reviewed and incorporated, Labs reviewed, Old chart reviewed and Notes from prior ED visits   47 year old female who presents with nausea and vomiting after eating  [employer]. Differential diagnosis includes, but is not limited to, biliary disease (biliary colic, acute cholecystitis, cholangitis, choledocholithiasis, etc), intrathoracic causes for epigastric abdominal pain including ACS, gastritis, duodenitis, pancreatitis, gastroenteritis, small bowel or large bowel obstruction, abdominal aortic aneurysm, hernia, and ulcer(s).  Laboratory and urinalysis results unremarkable.  Will initiate IV fluid resuscitation, 4 mg IV Zofran for nausea and try ice chips.   Clinical Course as of Jan 26 118  Nancy Fetter Jan 26, 2018  0119 Patient tolerated ice chips without emesis and is overall feeling significantly better.  Will discharge home on Zofran to use as needed.  Strict return precautions given.  Patient verbalizes understanding agrees with plan of care.   [JS]    Clinical Course User Index [JS] Paulette Blanch, MD     ____________________________________________   FINAL CLINICAL IMPRESSION(S) / ED DIAGNOSES  Final diagnoses:  Non-intractable vomiting with nausea, unspecified vomiting type     ED Discharge Orders        Ordered    ondansetron (ZOFRAN ODT) 4 MG disintegrating tablet  Every 8 hours PRN     01/25/18 2326       Note:  This document was prepared using Dragon voice recognition software and may include unintentional dictation errors.    Paulette Blanch, MD 01/26/18 [phone]

## 2018-01-25 NOTE — Discharge Instructions (Signed)
1.  You may take Zofran as needed for nausea or vomiting. 2.  Clear liquids for the next 12 hours, then bland diet x 3 days, then slowly advance diet as tolerated. 3.  Return to the ER for worsening symptoms, persistent vomiting, difficulty breathing or other concerns.

## 2018-04-28 ENCOUNTER — Ambulatory Visit (INDEPENDENT_AMBULATORY_CARE_PROVIDER_SITE_OTHER): Payer: Medicare Other | Admitting: Urology

## 2018-04-28 ENCOUNTER — Encounter: Payer: Self-pay | Admitting: Urology

## 2018-04-28 VITALS — BP 100/67 | HR 52 | Resp 16 | Ht 67.0 in | Wt 190.0 lb

## 2018-04-28 DIAGNOSIS — R350 Frequency of micturition: Secondary | ICD-10-CM | POA: Diagnosis not present

## 2018-04-28 NOTE — Progress Notes (Signed)
04/28/2018 9:58 AM   Alexis Christensen [birth year] [id number]  Referring provider: Perrin Maltese, MD [place], [place] [id number]  Chief Complaint  Patient presents with  . Follow-up    HPI: The patient had InterStim on 11/26/2016 for persistent overactive bladder and urgency incontinence after sling.The patient no longer has stress incontinence and had failed multiple medications. The need for the InterStim was discussed pre-operatively.  She is continent with intermittent urgency. She used to have mild bedwetting and does not have it either. Frequency also improved.    last The patient came in today with a 3-week history of what she described initially as left upper quadrant pain but on further evaluation I believe it was a bit lower almost into her left lower quadrant.  It is daily.  It is present for 24 hours/day but waxes and wanes in severity.  She describes a negative gastrointestinal visit, CT scan and urine culture.  She had a CT scan stone protocol October 13, 2017 that was normal.  Lab test normal.  I did not see the urine culture in the medical record.  She stays quite dry during the day.  She gets up a few times at night.  If she holds it too long she can has some foot on the floor drawn but the device appears to be working very well  On physical examination there is no tenderness over the device on the left side.  All incisions look normal.  I did not think the presentation was due to the InterStim device in the urine culture was negative on this day  Today Frequency improved.  Almost completely continent and very pleased.  No blood or infections.     PMH: Past Medical History:  Diagnosis Date  . Adopted   . Anemia   . Anxiety   . Arthritis   . Bipolar 1 disorder ([place])   . Carpal tunnel syndrome 09/15/2014  . CHF (congestive heart failure) (HCC)    Dialostic CHF  . Chronic diarrhea 09/07/2015  . Chronic kidney disease    H/O KIDNEY STONES  . Chronic  venous insufficiency 04/26/2016  . Depression   . DVT (deep venous thrombosis) ([place]) 2016   RIGHT LEG  . Edema leg   . Effusion of knee 12/30/2013  . Family history of adverse reaction to anesthesia    ADOPTED  . GERD (gastroesophageal reflux disease)   . 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 Headache(784.0)    MIGRAINES  . Heart burn   . Heart murmur   . History of kidney stones   . HLD (hyperlipidemia)   . Hx MRSA infection   . Hypothyroidism   . Lipoma of arm 05/11/2013  . Lower extremity edema   . Lymphedema 04/26/2016  . Post traumatic stress disorder (PTSD)   . PVD (peripheral vascular disease) ([place])   . Seasonal allergies   . Stress incontinence   . Thyroid disease   . Urge incontinence     Surgical History: Past Surgical History:  Procedure Laterality Date  . ABDOMINAL HYSTERECTOMY    . ANKLE SURGERY    . CHOLECYSTECTOMY    . CYSTOSCOPY N/A 04/09/2016   Procedure: CYSTOSCOPY;  Surgeon: Bjorn Loser, MD;  Location: [facility];  Service: Urology;  Laterality: N/A;  . DILATION AND CURETTAGE OF UTERUS    . INGUINAL HERNIA REPAIR Bilateral 04/18/2017   Procedure: LAPAROSCOPIC BILATERAL INGUINAL HERNIA REPAIR;  Surgeon: Jules Husbands, MD;  Location: [facility];  Service: General;  Laterality: Bilateral;  . INTERSTIM IMPLANT PLACEMENT N/A 11/26/2016   Procedure: Barrie Lyme IMPLANT FIRST STAGE;  Surgeon: Bjorn Loser, MD;  Location: [facility];  Service: Urology;  Laterality: N/A;  . INTERSTIM IMPLANT PLACEMENT N/A 11/26/2016   Procedure: Barrie Lyme IMPLANT SECOND STAGE;  Surgeon: Bjorn Loser, MD;  Location: [facility];  Service: Urology;  Laterality: N/A;  . JOINT REPLACEMENT Right    knee  . KNEE ARTHROSCOPY Bilateral   . KNEE ARTHROSCOPY WITH LATERAL RELEASE Left 10/18/2015   Procedure: KNEE ARTHROSCOPY LATERAL AND PARTIAL SYNOVECTOMY;  Surgeon: Hessie Knows, MD;  Location: [facility];  Service: Orthopedics;  Laterality: Left;  . Lymph Node removal  2015   Neck  .  PUBOVAGINAL SLING N/A 04/09/2016   Procedure: PUBO-VAGINAL SLING/ RETROPUBIC SLING;  Surgeon: Bjorn Loser, MD;  Location: [facility];  Service: Urology;  Laterality: N/A;  . REPLACEMENT TOTAL KNEE Right   . SHOULDER SURGERY Right 2014  . TUBAL LIGATION    . WRIST SURGERY Right    metal plate    Home Medications:  Allergies as of 04/28/2018      Reactions   Geodon [ziprasidone Hydrochloride] Other (See Comments)   Numbness ,sob, headaches, blurred vision   Sulfacetamide Sodium Hives   Ziprasidone Hcl Anaphylaxis, Other (See Comments)   Other reaction(s): Other (See Comments), Unknown Numbness ,sob, headaches, blurred vision Numbness ,sob, headaches, blurred vision   Sulfa Antibiotics Hives   Other reaction(s): Unknown   Ace Inhibitors Rash   Erythromycin Rash, Hives   Hydromorphone Rash   Lamictal [lamotrigine] Rash   Other reaction(s): Unknown   Strawberry (diagnostic) Rash      Medication List        Accurate as of 04/28/18  9:58 AM. Always use your most recent med list.          ABILIFY MAINTENA IM Inject 400 mg into the muscle every 30 (thirty) days.   clobetasol cream 0.05 % Commonly known as:  TEMOVATE Apply 1 application topically 2 (two) times daily as needed (for rash).   colestipol 1 g tablet Commonly known as:  COLESTID Take 2 g by mouth daily at 12 noon.   cyclobenzaprine 10 MG tablet Commonly known as:  FLEXERIL Take 10 mg by mouth 2 (two) times daily as needed for muscle spasms.   diclofenac sodium 1 % Gel Commonly known as:  VOLTAREN Apply 2 g topically 4 (four) times daily.   DULoxetine 60 MG capsule Commonly known as:  CYMBALTA Take 60 mg by mouth at bedtime.   fluticasone 50 MCG/ACT nasal spray Commonly known as:  FLONASE Place 2 sprays into both nostrils daily.   gabapentin 600 MG tablet Commonly known as:  NEURONTIN Take 600 mg by mouth 2 (two) times daily.   hydrOXYzine 25 MG tablet Commonly known as:  ATARAX/VISTARIL Take 50  mg by mouth every 8 (eight) hours as needed for itching.   levocetirizine 5 MG tablet Commonly known as:  XYZAL Take 5 mg by mouth every evening.   levothyroxine 150 MCG tablet Commonly known as:  SYNTHROID, LEVOTHROID Take 150 mcg by mouth daily before breakfast.   ondansetron 4 MG disintegrating tablet Commonly known as:  ZOFRAN-ODT Take 1 tablet (4 mg total) by mouth every 8 (eight) hours as needed for nausea or vomiting.   prazosin 2 MG capsule Commonly known as:  MINIPRESS Take 2 mg by mouth at bedtime.   ranitidine 150 MG tablet Commonly known as:  ZANTAC Take 150 mg  by mouth 2 (two) times daily.   SUBOXONE 8-2 MG Film Generic drug:  Buprenorphine HCl-Naloxone HCl Dissolve 1 film under tongue twice daily       Allergies:  Allergies  Allergen Reactions  . Geodon [Ziprasidone Hydrochloride] Other (See Comments)    Numbness ,sob, headaches, blurred vision  . Sulfacetamide Sodium Hives  . Ziprasidone Hcl Anaphylaxis and Other (See Comments)    Other reaction(s): Other (See Comments), Unknown Numbness ,sob, headaches, blurred vision Numbness ,sob, headaches, blurred vision  . Sulfa Antibiotics Hives    Other reaction(s): Unknown  . Ace Inhibitors Rash  . Erythromycin Rash and Hives  . Hydromorphone Rash  . Lamictal [Lamotrigine] Rash    Other reaction(s): Unknown  . Strawberry (Diagnostic) Rash    Family History: Family History  Adopted: Yes  Family history unknown: Yes    Social History:  reports that she quit smoking about 5 years ago. Her smoking use included cigarettes. She has a 5.00 pack-year smoking history. She has quit using smokeless tobacco. She reports that she drank alcohol. She reports that she does not use drugs.  ROS: UROLOGY Frequent Urination?: Yes Hard to postpone urination?: No Burning/pain with urination?: No Get up at night to urinate?: Yes Leakage of urine?: No Urine stream starts and stops?: No Trouble starting stream?: No Do  you have to strain to urinate?: No Blood in urine?: No Urinary tract infection?: No Sexually transmitted disease?: No Injury to kidneys or bladder?: No Painful intercourse?: No Weak stream?: No Currently pregnant?: No Vaginal bleeding?: No Last menstrual period?: n  Gastrointestinal Nausea?: No Vomiting?: No Indigestion/heartburn?: No Diarrhea?: No Constipation?: No  Constitutional Fever: No Night sweats?: No Weight loss?: No Fatigue?: No  Skin Skin rash/lesions?: No Itching?: No  Eyes Blurred vision?: No Double vision?: No  Ears/Nose/Throat Sore throat?: No Sinus problems?: No  Hematologic/Lymphatic Swollen glands?: No Easy bruising?: No  Cardiovascular Leg swelling?: No Chest pain?: No  Respiratory Cough?: No Shortness of breath?: No  Endocrine Excessive thirst?: No  Musculoskeletal Back pain?: No Joint pain?: No  Neurological Headaches?: No Dizziness?: No  Psychologic Depression?: Yes Anxiety?: Yes  Physical Exam: BP 100/67   Pulse (!) 52   Resp 16   Ht 5\' 7"  (1.702 m)   Wt 86.2 kg   BMI 29.76 kg/m   Constitutional:  Alert and oriented, No acute distress.  Laboratory Data: Lab Results  Component Value Date   WBC 5.7 01/25/2018   HGB 12.8 01/25/2018   HCT 37.7 01/25/2018   MCV 90.8 01/25/2018   PLT 246 01/25/2018    Lab Results  Component Value Date   CREATININE 0.61 01/25/2018    No results found for: PSA  No results found for: TESTOSTERONE  No results found for: HGBA1C  Urinalysis    Component Value Date/Time   COLORURINE YELLOW (A) 01/25/2018 1700   APPEARANCEUR HAZY (A) 01/25/2018 1700   APPEARANCEUR Cloudy (A) 10/21/2017 1029   LABSPEC 1.025 01/25/2018 1700   PHURINE 5.0 01/25/2018 1700   GLUCOSEU NEGATIVE 01/25/2018 1700   HGBUR NEGATIVE 01/25/2018 1700   BILIRUBINUR NEGATIVE 01/25/2018 1700   BILIRUBINUR Negative 10/21/2017 1029   KETONESUR NEGATIVE 01/25/2018 1700   PROTEINUR NEGATIVE 01/25/2018 1700     UROBILINOGEN 1.0 06/18/2011 1258   NITRITE NEGATIVE 01/25/2018 1700   LEUKOCYTESUR NEGATIVE 01/25/2018 1700   LEUKOCYTESUR Negative 10/21/2017 1029    Pertinent Imaging:   Assessment & Plan: I decided to see Ms. Hyland as needed  There are no diagnoses  linked to this encounter.  No follow-ups on file.  Reece Packer, MD  [facility] [address], [place], [place] [address] [phone]

## 2018-05-04 ENCOUNTER — Other Ambulatory Visit: Payer: Self-pay

## 2018-05-04 ENCOUNTER — Emergency Department
Admission: EM | Admit: 2018-05-04 | Discharge: 2018-05-04 | Disposition: A | Discharge status: Home / Self Care | Payer: Medicare Other | Attending: Emergency Medicine | Admitting: Emergency Medicine

## 2018-05-04 DIAGNOSIS — N189 Chronic kidney disease, unspecified: Secondary | ICD-10-CM | POA: Diagnosis not present

## 2018-05-04 DIAGNOSIS — M545 Low back pain, unspecified: Secondary | ICD-10-CM

## 2018-05-04 DIAGNOSIS — I5030 Unspecified diastolic (congestive) heart failure: Secondary | ICD-10-CM | POA: Diagnosis not present

## 2018-05-04 DIAGNOSIS — E039 Hypothyroidism, unspecified: Secondary | ICD-10-CM | POA: Diagnosis not present

## 2018-05-04 DIAGNOSIS — Z79899 Other long term (current) drug therapy: Secondary | ICD-10-CM | POA: Diagnosis not present

## 2018-05-04 DIAGNOSIS — M5489 Other dorsalgia: Secondary | ICD-10-CM | POA: Diagnosis present

## 2018-05-04 DIAGNOSIS — Z87891 Personal history of nicotine dependence: Secondary | ICD-10-CM | POA: Diagnosis not present

## 2018-05-04 LAB — URINALYSIS, COMPLETE (UACMP) WITH MICROSCOPIC
Bilirubin Urine: NEGATIVE
Glucose, UA: NEGATIVE mg/dL
Hgb urine dipstick: NEGATIVE
Ketones, ur: NEGATIVE mg/dL
Leukocytes, UA: NEGATIVE
Nitrite: NEGATIVE
Protein, ur: NEGATIVE mg/dL
Specific Gravity, Urine: 1.025 (ref 1.005–1.030)
pH: 7 (ref 5.0–8.0)

## 2018-05-04 LAB — POCT PREGNANCY, URINE: Preg Test, Ur: NEGATIVE

## 2018-05-04 MED ORDER — MELOXICAM 15 MG PO TABS: 15.0000 mg | ORAL_TABLET | Freq: Every day | ORAL | 1 refills | Status: AC

## 2018-05-04 MED ORDER — METHOCARBAMOL 500 MG PO TABS: 500.0000 mg | ORAL_TABLET | Freq: Three times a day (TID) | ORAL | 0 refills | Status: AC | PRN

## 2018-05-04 NOTE — ED Provider Notes (Signed)
[facility] Emergency Department Provider Note  ____________________________________________  Time seen: Approximately 6:47 PM  I have reviewed the triage vital signs and the nursing notes.   HISTORY  Chief Complaint Back Pain    HPI Alexis Christensen is a 47 y.o. female presents to the emergency department with low back pain for the past 2 days.  Patient denies falls or mechanisms of trauma.  She reports that pain is worsened with prolonged standing.  She denies numbness or tingling of the lower extremities.  She reports that her back feels like it is "spasming".  No dysuria, hematuria or increased urinary frequency.  No fever or bowel or bladder incontinence.  No saddle anesthesia.  No other alleviating measures have been attempted.   Past Medical History:  Diagnosis Date  . Adopted   . Anemia   . Anxiety   . Arthritis   . Bipolar 1 disorder ([place])   . Carpal tunnel syndrome 09/15/2014  . CHF (congestive heart failure) (HCC)    Dialostic CHF  . Chronic diarrhea 09/07/2015  . Chronic kidney disease    H/O KIDNEY STONES  . Chronic venous insufficiency 04/26/2016  . Depression   . DVT (deep venous thrombosis) ([place]) 2016   RIGHT LEG  . Edema leg   . Effusion of knee 12/30/2013  . Family history of adverse reaction to anesthesia    ADOPTED  . GERD (gastroesophageal reflux disease)   . 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 Headache(784.0)    MIGRAINES  . Heart burn   . Heart murmur   . History of kidney stones   . HLD (hyperlipidemia)   . Hx MRSA infection   . Hypothyroidism   . Lipoma of arm 05/11/2013  . Lower extremity edema   . Lymphedema 04/26/2016  . Post traumatic stress disorder (PTSD)   . PVD (peripheral vascular disease) ([place])   . Seasonal allergies   . Stress incontinence   . Thyroid disease   . Urge incontinence     Patient Active Problem List   Diagnosis Date Noted  . Bilateral inguinal hernia without obstruction or gangrene   .  Redness of eye, left 08/22/2016  . Iron deficiency anemia 07/11/2016  . ANA positive 06/13/2016  . Bilateral hand pain 06/13/2016  . Lymphedema 04/26/2016  . Chronic venous insufficiency 04/26/2016  . Swelling of limb 04/26/2016  . Chronic diarrhea 09/07/2015  . Fever 09/07/2015  . Hypothyroidism 09/07/2015  . Hypovitaminosis D 09/07/2015  . Obesity 09/07/2015  . Weight loss, unintentional 09/07/2015  . Bipolar mixed affective disorder, moderate ([place]) 09/05/2015  . SUI (stress urinary incontinence, female) 05/18/2015  . Pain in shoulder 09/15/2014  . Carpal tunnel syndrome 09/15/2014  . Chronic left shoulder pain 09/15/2014  . Effusion of knee 12/30/2013  . 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 Gonalgia 12/30/2013  . Lipoma of arm 05/11/2013    Past Surgical History:  Procedure Laterality Date  . ABDOMINAL HYSTERECTOMY    . ANKLE SURGERY    . CHOLECYSTECTOMY    . CYSTOSCOPY N/A 04/09/2016   Procedure: CYSTOSCOPY;  Surgeon: Bjorn Loser, MD;  Location: [facility];  Service: Urology;  Laterality: N/A;  . DILATION AND CURETTAGE OF UTERUS    . INGUINAL HERNIA REPAIR Bilateral 04/18/2017   Procedure: LAPAROSCOPIC BILATERAL INGUINAL HERNIA REPAIR;  Surgeon: Jules Husbands, MD;  Location: [facility];  Service: General;  Laterality: Bilateral;  . INTERSTIM IMPLANT PLACEMENT N/A 11/26/2016   Procedure: INTERSTIM IMPLANT FIRST STAGE;  Surgeon: Bjorn Loser, MD;  Location: [facility];  Service: Urology;  Laterality: N/A;  . INTERSTIM IMPLANT PLACEMENT N/A 11/26/2016   Procedure: Barrie Lyme IMPLANT SECOND STAGE;  Surgeon: Bjorn Loser, MD;  Location: [facility];  Service: Urology;  Laterality: N/A;  . JOINT REPLACEMENT Right    knee  . KNEE ARTHROSCOPY Bilateral   . KNEE ARTHROSCOPY WITH LATERAL RELEASE Left 10/18/2015   Procedure: KNEE ARTHROSCOPY LATERAL AND PARTIAL SYNOVECTOMY;  Surgeon: Hessie Knows, MD;  Location: [facility];  Service: Orthopedics;  Laterality: Left;  . Lymph Node  removal  2015   Neck  . PUBOVAGINAL SLING N/A 04/09/2016   Procedure: PUBO-VAGINAL SLING/ RETROPUBIC SLING;  Surgeon: Bjorn Loser, MD;  Location: [facility];  Service: Urology;  Laterality: N/A;  . REPLACEMENT TOTAL KNEE Right   . SHOULDER SURGERY Right 2014  . TUBAL LIGATION    . WRIST SURGERY Right    metal plate    Prior to Admission medications   Medication Sig Start Date End Date Taking? Authorizing Provider  ARIPiprazole (ABILIFY MAINTENA IM) Inject 400 mg into the muscle every 30 (thirty) days.    [provider]  clobetasol cream (TEMOVATE) 4.25 % Apply 1 application topically 2 (two) times daily as needed (for rash).     [provider]  colestipol (COLESTID) 1 g tablet Take 2 g by mouth daily at 12 noon.     [provider]  cyclobenzaprine (FLEXERIL) 10 MG tablet Take 10 mg by mouth 2 (two) times daily as needed for muscle spasms.  03/29/17   [provider]  diclofenac sodium (VOLTAREN) 1 % GEL Apply 2 g topically 4 (four) times daily. 12/23/17   Laban Emperor, PA-C  DULoxetine (CYMBALTA) 60 MG capsule Take 60 mg by mouth at bedtime.     [provider]  fluticasone (FLONASE) 50 MCG/ACT nasal spray Place 2 sprays into both nostrils daily.     [provider]  gabapentin (NEURONTIN) 600 MG tablet Take 600 mg by mouth 2 (two) times daily.     [provider]  hydrOXYzine (ATARAX/VISTARIL) 25 MG tablet Take 50 mg by mouth every 8 (eight) hours as needed for itching.    [provider]  levocetirizine (XYZAL) 5 MG tablet Take 5 mg by mouth every evening.    [provider]  levothyroxine (SYNTHROID, LEVOTHROID) 150 MCG tablet Take 150 mcg by mouth daily before breakfast.  02/24/16   [provider]  meloxicam (MOBIC) 15 MG tablet Take 1 tablet (15 mg total) by mouth daily for 7 days. 05/04/18 05/11/18  Lannie Fields, PA-C  methocarbamol (ROBAXIN) 500 MG tablet Take 1 tablet (500 mg total) by  mouth every 8 (eight) hours as needed for up to 5 days. 05/04/18 05/09/18  Lannie Fields, PA-C  ondansetron (ZOFRAN ODT) 4 MG disintegrating tablet Take 1 tablet (4 mg total) by mouth every 8 (eight) hours as needed for nausea or vomiting. 01/25/18   Paulette Blanch, MD  prazosin (MINIPRESS) 2 MG capsule Take 2 mg by mouth at bedtime.    [provider]  ranitidine (ZANTAC) 150 MG tablet Take 150 mg by mouth 2 (two) times daily.    [provider]  SUBOXONE 8-2 MG FILM Dissolve 1 film under tongue twice daily 09/14/16   [provider]    Allergies Geodon [ziprasidone hydrochloride]; Sulfacetamide sodium; Ziprasidone hcl; Sulfa antibiotics; Ace inhibitors; Erythromycin; Hydromorphone; Lamictal [lamotrigine]; and Strawberry (diagnostic)  Family History  Adopted: Yes  Family history unknown: Yes    Social History Social History   Tobacco Use  . Smoking status: Former Smoker    Packs/day: 0.25    Years: 20.00    Pack years: 5.00    Types: Cigarettes    Last attempt to quit: 11/13/2012    Years since quitting: 5.4  . Smokeless tobacco: Former Systems developer  . Tobacco comment: STRESS RELATED  Substance Use Topics  . Alcohol use: Not Currently    Alcohol/week: 0.0 standard drinks    Frequency: Never    Comment: occasional  . Drug use: No     Review of Systems  Constitutional: No fever/chills Eyes: No visual changes. No discharge ENT: No upper respiratory complaints. Cardiovascular: no chest pain. Respiratory: no cough. No SOB. Gastrointestinal: No abdominal pain.  No nausea, no vomiting.  No diarrhea.  No constipation. Musculoskeletal: Patient has low back pain.  Skin: Negative for rash, abrasions, lacerations, ecchymosis. Neurological: Negative for headaches, focal weakness or numbness.  ____________________________________________   PHYSICAL EXAM:  VITAL SIGNS: ED Triage Vitals [05/04/18 1538]  Enc Vitals Group     BP 99/65     Pulse Rate (!) 52      Resp 16     Temp 98.4 F (36.9 C)     Temp Source Oral     SpO2 97 %     Weight 180 lb (81.6 kg)     Height 5\' 7"  (1.702 m)     Head Circumference      Peak Flow      Pain Score 7     Pain Loc      Pain Edu?      Excl. in [place]?      Constitutional: Alert and oriented. Well appearing and in no acute distress. Eyes: Conjunctivae are normal. PERRL. EOMI. Head: Atraumatic. Hematological/Lymphatic/Immunilogical: No cervical lymphadenopathy.  Cardiovascular: Normal rate, regular rhythm. Normal S1 and S2.  Good peripheral circulation. Respiratory: Normal respiratory effort without tachypnea or retractions. Lungs CTAB. Good air entry to the bases with no decreased or absent breath sounds. Gastrointestinal: Bowel sounds 4 quadrants. Soft and nontender to palpation. No guarding or rigidity. No palpable masses. No distention. No CVA tenderness. Musculoskeletal: Full range of motion to all extremities. No gross deformities appreciated.  Patient has paraspinal muscle tenderness along the lumbar spine.  No midline lumbar spine tenderness.  Negative straight leg raise bilaterally. Neurologic:  Normal speech and language. No gross focal neurologic deficits are appreciated.  Skin:  Skin is warm, dry and intact. No rash noted. Psychiatric: Mood and affect are normal. Speech and behavior are normal. Patient exhibits appropriate insight and judgement.   ____________________________________________   LABS (all labs ordered are listed, but only abnormal results are displayed)  Labs Reviewed  URINALYSIS, COMPLETE (UACMP) WITH MICROSCOPIC - Abnormal; Notable for the following components:      Result Value   APPearance HAZY (*)    Bacteria, UA RARE (*)    All other components within normal limits  POC URINE PREG, ED  POCT PREGNANCY, URINE   ____________________________________________  EKG   ____________________________________________  RADIOLOGY   No results  found.  ____________________________________________    PROCEDURES  Procedure(s) performed:    Procedures    Medications - No data to display   ____________________________________________   INITIAL IMPRESSION / ASSESSMENT AND PLAN / ED COURSE  Pertinent labs & imaging results that were available during my care of the patient were reviewed by me and considered in my medical  decision making (see chart for details).  Review of the [place] CSRS was performed in accordance of the [place] prior to dispensing any controlled drugs.      Assessment and plan Low back pain Patient presents to the emergency department with low back pain for the past 2 days.  Differential diagnosis included lumbar strain versus cystitis.  Urinalysis was noncontributory for cystitis in the emergency department.  Patient was discharged with meloxicam and Robaxin. All patient questions were answered.      ____________________________________________  FINAL CLINICAL IMPRESSION(S) / ED DIAGNOSES  Final diagnoses:  Acute bilateral low back pain without sciatica      NEW MEDICATIONS STARTED DURING THIS VISIT:  ED Discharge Orders         Ordered    meloxicam (MOBIC) 15 MG tablet  Daily     05/04/18 1808    methocarbamol (ROBAXIN) 500 MG tablet  Every 8 hours PRN     05/04/18 1808              This chart was dictated using voice recognition software/Dragon. Despite best efforts to proofread, errors can occur which can change the meaning. Any change was purely unintentional.    Lannie Fields, PA-C 05/04/18 Alexis Christensen    Lavonia Drafts, MD 05/04/18 [phone]

## 2018-05-04 NOTE — ED Notes (Signed)
Back pain started yesterday Vomited x 4   Today  Pain is worse  On movement  Denies any injury

## 2018-05-04 NOTE — ED Triage Notes (Signed)
Pt reports back spasms that started last night, states that they are worse with sitting and denies any injury, pt ambulating without diff, steady gait

## 2018-06-04 DIAGNOSIS — E042 Nontoxic multinodular goiter: Secondary | ICD-10-CM | POA: Insufficient documentation

## 2018-06-11 ENCOUNTER — Other Ambulatory Visit: Payer: Self-pay | Admitting: Nurse Practitioner

## 2018-06-11 DIAGNOSIS — Z1231 Encounter for screening mammogram for malignant neoplasm of breast: Secondary | ICD-10-CM

## 2018-06-16 IMAGING — CR DG LUMBAR SPINE 2-3V
1 series · 3 of 3 positions shown · non-contrast
Comparison: CT abdomen pelvis of 03/25/2017

CLINICAL DATA: Severe low back pain, no acute injury

EXAM:
LUMBAR SPINE - 2-3 VIEW

[Series 1: dg lumbar spine 2-3 views · 0.14mm/px · 3 of 3 slices shown]
[im 1/3]
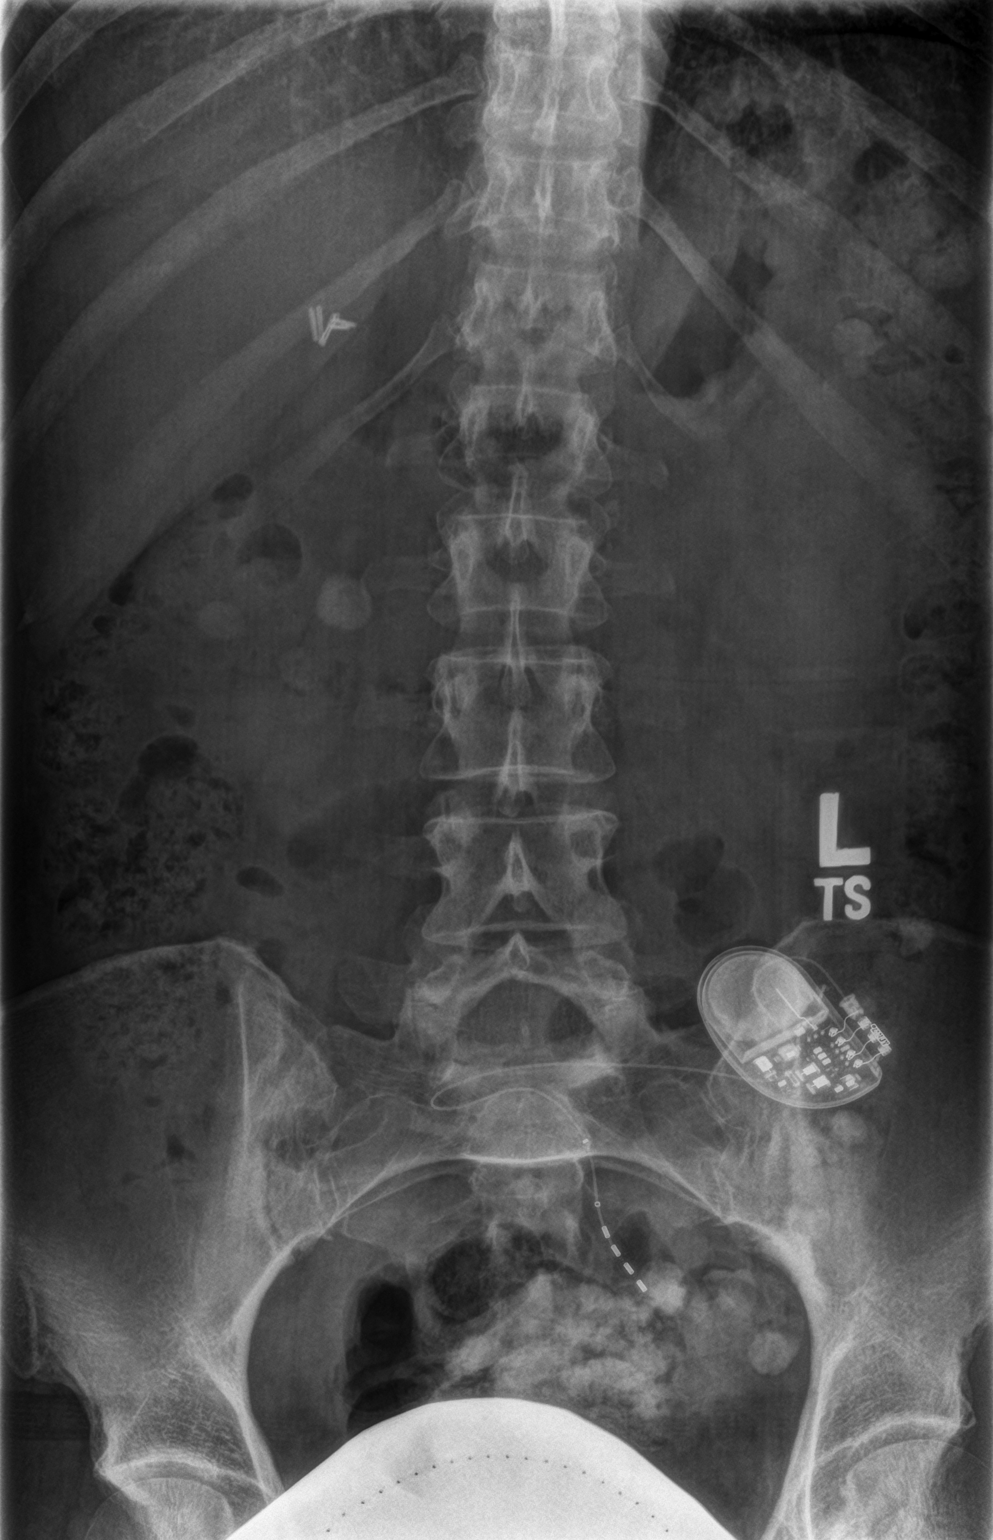
[im 2/3]
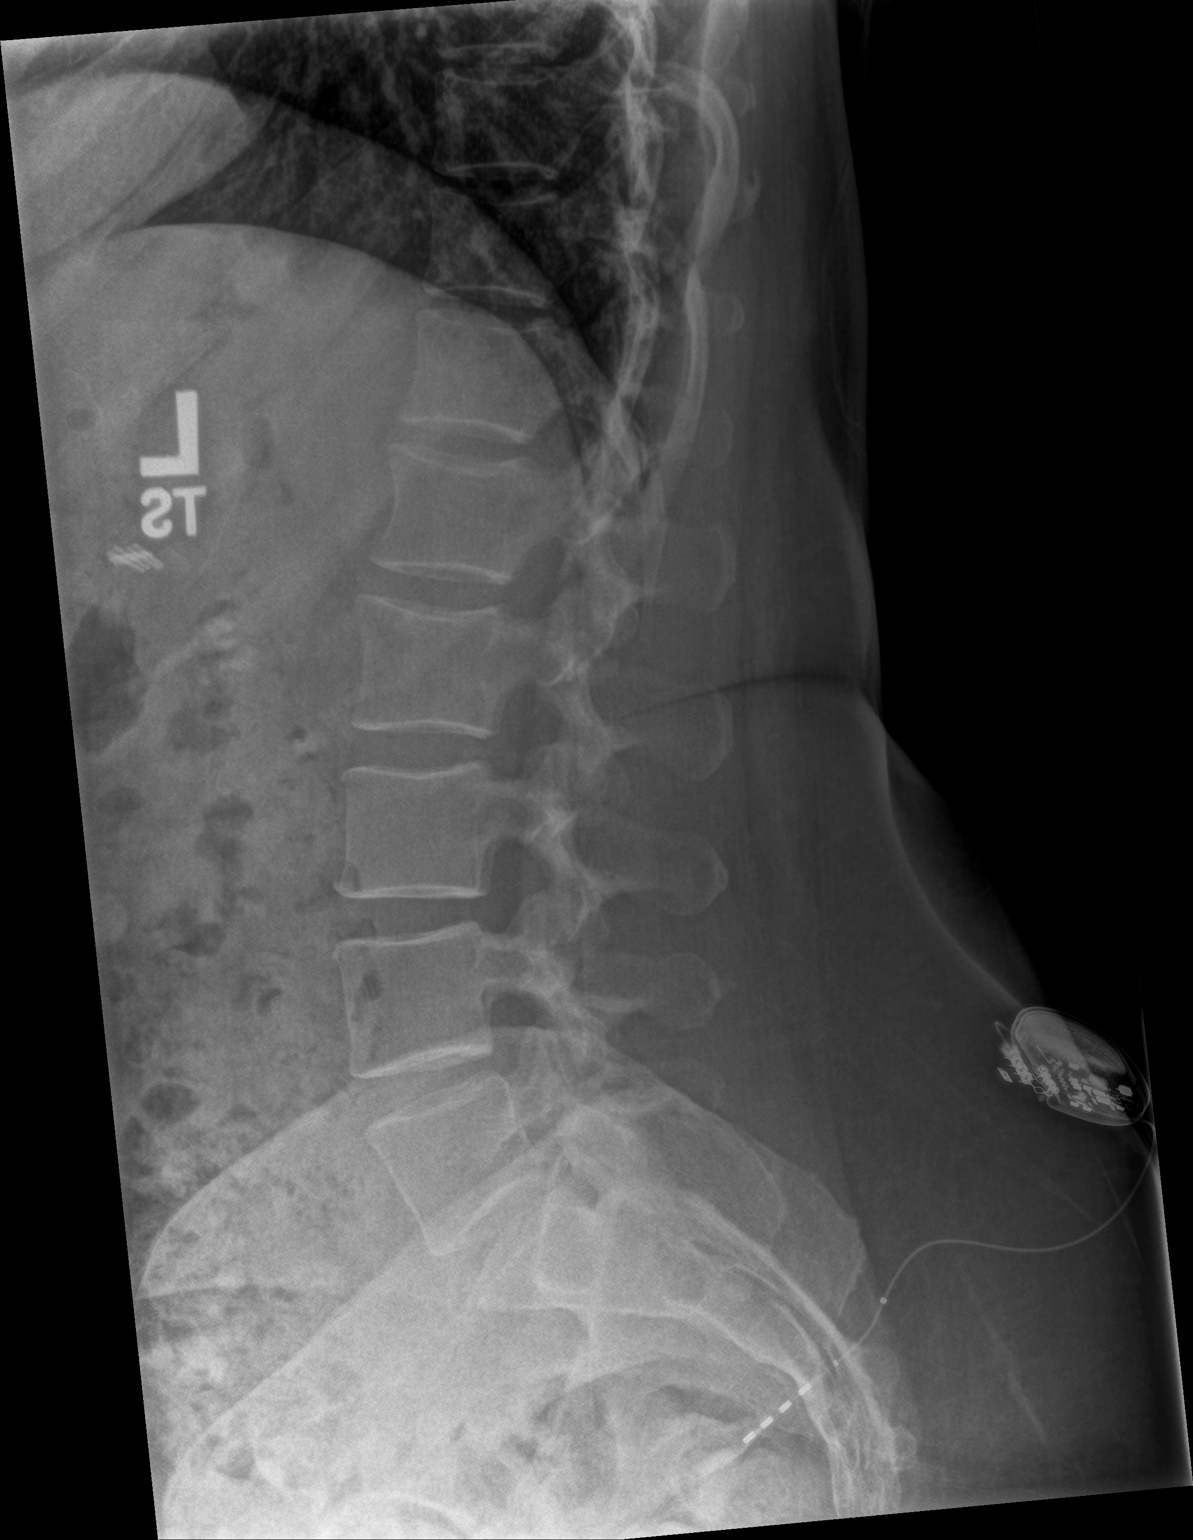
[im 3/3]
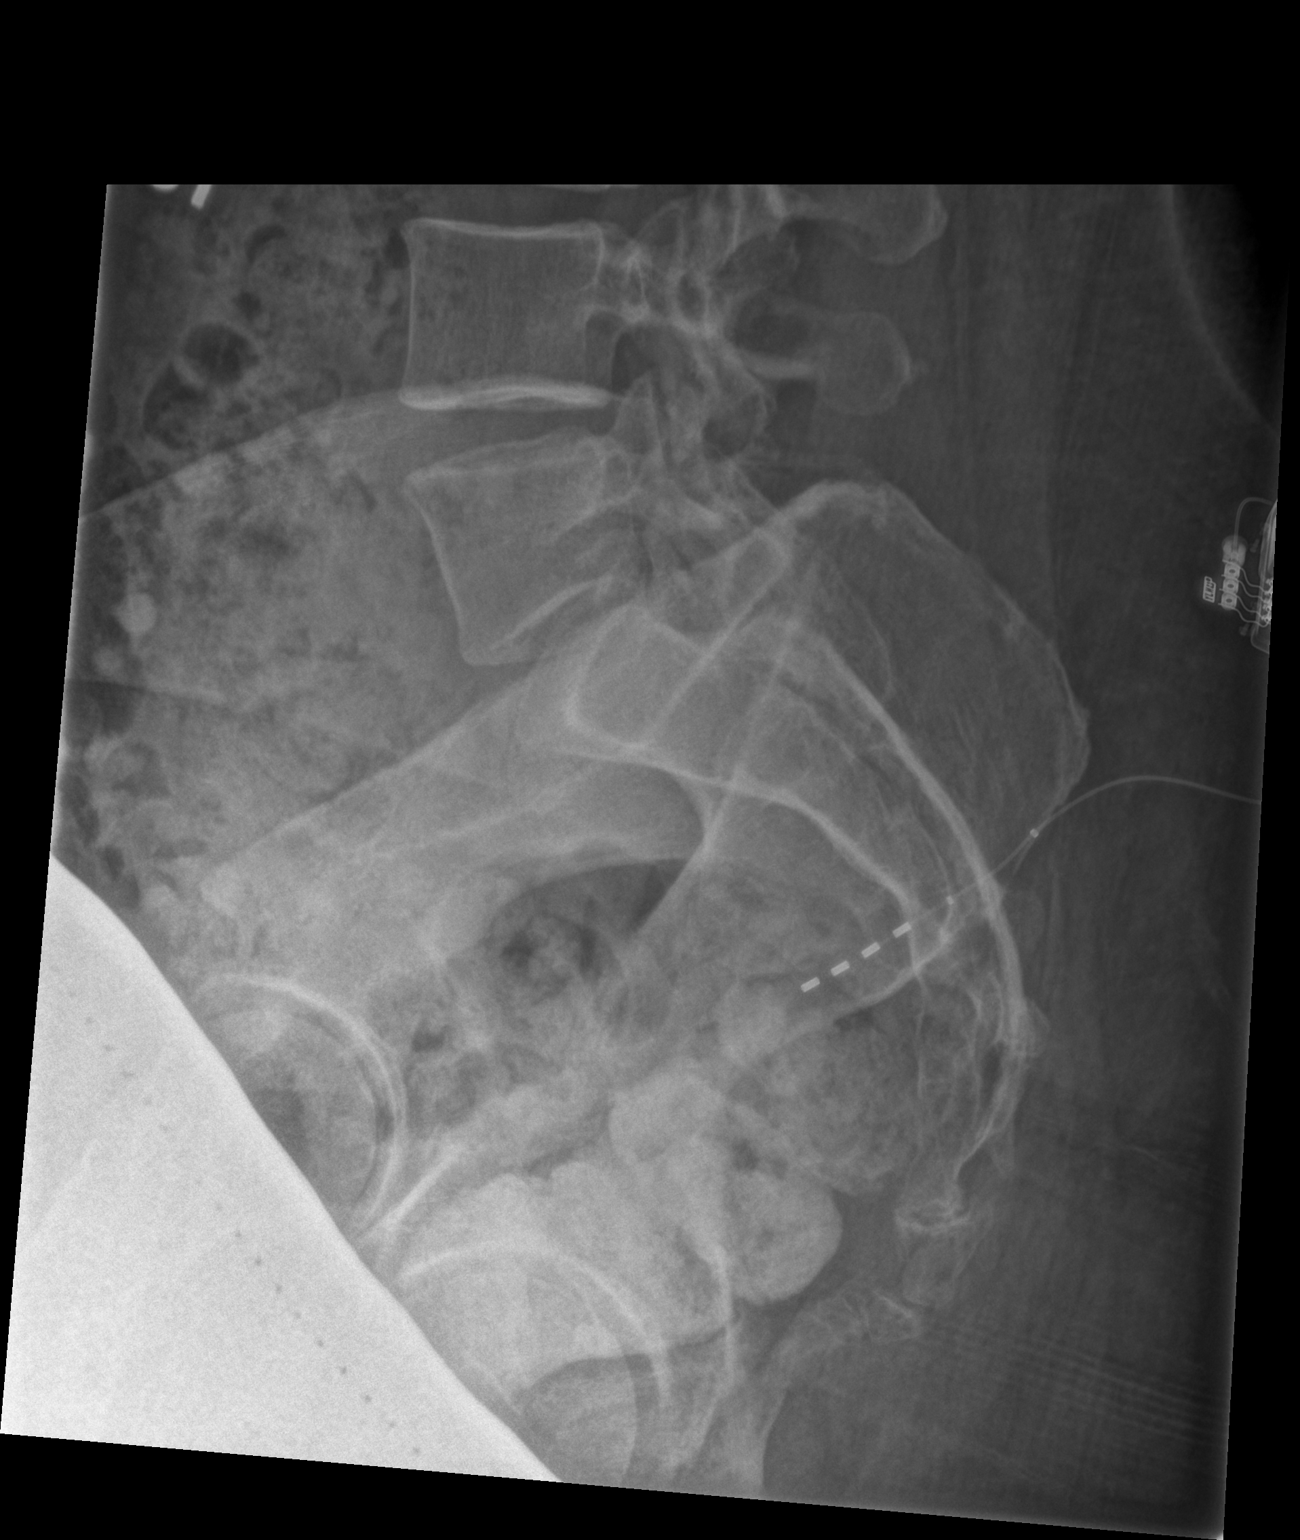

[3 of 3 positions shown; findings below may reference images not displayed]

FINDINGS: The lumbar vertebrae are in normal alignment. Intervertebral disc
spaces appear normal. No compression deformity is seen.
Neurostimulator electrode lead overlies the mid sacrococcygeal
region. Rounded areas of higher attenuation are noted along the
course of the colon most consistent with some residual contrast
within colonic diverticulae diffusely. Surgical clips are noted in
the right upper quadrant from prior cholecystectomy.
IMPRESSION: 1. Normal alignment of the lumbar vertebrae with normal
intervertebral disc spaces.
2. Probable multiple colonic diverticulae.

## 2018-08-26 ENCOUNTER — Ambulatory Visit
Admission: RE | Admit: 2018-08-26 | Discharge: 2018-08-26 | Disposition: A | Discharge status: Home / Self Care | Payer: Medicare Other | Source: Ambulatory Visit | Attending: Nurse Practitioner | Admitting: Nurse Practitioner

## 2018-08-26 DIAGNOSIS — Z1231 Encounter for screening mammogram for malignant neoplasm of breast: Secondary | ICD-10-CM | POA: Insufficient documentation

## 2019-01-28 ENCOUNTER — Other Ambulatory Visit: Payer: Self-pay

## 2019-01-28 ENCOUNTER — Emergency Department: Payer: Medicare Other

## 2019-01-28 ENCOUNTER — Emergency Department
Admission: EM | Admit: 2019-01-28 | Discharge: 2019-01-28 | Disposition: A | Discharge status: Home / Self Care | Payer: Medicare Other | Attending: Emergency Medicine | Admitting: Emergency Medicine

## 2019-01-28 ENCOUNTER — Encounter: Payer: Self-pay | Admitting: Emergency Medicine

## 2019-01-28 DIAGNOSIS — R600 Localized edema: Secondary | ICD-10-CM | POA: Diagnosis present

## 2019-01-28 DIAGNOSIS — I82401 Acute embolism and thrombosis of unspecified deep veins of right lower extremity: Secondary | ICD-10-CM | POA: Diagnosis not present

## 2019-01-28 DIAGNOSIS — N189 Chronic kidney disease, unspecified: Secondary | ICD-10-CM | POA: Insufficient documentation

## 2019-01-28 DIAGNOSIS — I5030 Unspecified diastolic (congestive) heart failure: Secondary | ICD-10-CM | POA: Insufficient documentation

## 2019-01-28 DIAGNOSIS — Z96651 Presence of right artificial knee joint: Secondary | ICD-10-CM | POA: Diagnosis not present

## 2019-01-28 DIAGNOSIS — Z87891 Personal history of nicotine dependence: Secondary | ICD-10-CM | POA: Diagnosis not present

## 2019-01-28 DIAGNOSIS — E039 Hypothyroidism, unspecified: Secondary | ICD-10-CM | POA: Diagnosis not present

## 2019-01-28 DIAGNOSIS — R609 Edema, unspecified: Secondary | ICD-10-CM

## 2019-01-28 LAB — COMPREHENSIVE METABOLIC PANEL
ALT: 90 U/L — ABNORMAL HIGH (ref 0–44)
AST: 89 U/L — ABNORMAL HIGH (ref 15–41)
Albumin: 3.9 g/dL (ref 3.5–5.0)
Alkaline Phosphatase: 106 U/L (ref 38–126)
Anion gap: 13 (ref 5–15)
BUN: 14 mg/dL (ref 6–20)
CO2: 22 mmol/L (ref 22–32)
Calcium: 8.4 mg/dL — ABNORMAL LOW (ref 8.9–10.3)
Chloride: 99 mmol/L (ref 98–111)
Creatinine, Ser: 0.72 mg/dL (ref 0.44–1.00)
GFR calc Af Amer: >60 mL/min (ref >60)
GFR calc non Af Amer: >60 mL/min (ref >60)
Glucose, Bld: 106 mg/dL — ABNORMAL HIGH (ref 70–99)
Potassium: 3.7 mmol/L (ref 3.5–5.1)
Sodium: 134 mmol/L — ABNORMAL LOW (ref 135–145)
Total Bilirubin: 1.8 mg/dL — ABNORMAL HIGH (ref 0.3–1.2)
Total Protein: 7.1 g/dL (ref 6.5–8.1)

## 2019-01-28 LAB — CBC WITH DIFFERENTIAL/PLATELET
Abs Immature Granulocytes: 0.03 10*3/uL (ref 0.00–0.07)
Basophils Absolute: 0.1 10*3/uL (ref 0.0–0.1)
Basophils Relative: 1 %
Eosinophils Absolute: 0 10*3/uL (ref 0.0–0.5)
Eosinophils Relative: 0 %
HCT: 36.8 % (ref 36.0–46.0)
Hemoglobin: 12.3 g/dL (ref 12.0–15.0)
Immature Granulocytes: 0 %
Lymphocytes Relative: 13 %
Lymphs Abs: 1.6 10*3/uL (ref 0.7–4.0)
MCH: 30.4 pg (ref 26.0–34.0)
MCHC: 33.4 g/dL (ref 30.0–36.0)
MCV: 90.9 fL (ref 80.0–100.0)
Monocytes Absolute: 0.8 10*3/uL (ref 0.1–1.0)
Monocytes Relative: 7 %
Neutro Abs: 9.5 10*3/uL — ABNORMAL HIGH (ref 1.7–7.7)
Neutrophils Relative %: 79 %
Platelets: 244 10*3/uL (ref 150–400)
RBC: 4.05 MIL/uL (ref 3.87–5.11)
RDW: 12 % (ref 11.5–15.5)
WBC: 12 10*3/uL — ABNORMAL HIGH (ref 4.0–10.5)
nRBC: 0 % (ref 0.0–0.2)

## 2019-01-28 NOTE — ED Provider Notes (Signed)
[facility] Emergency Department Provider Note       Time seen: ----------------------------------------- 7:42 AM on 01/28/2019 -----------------------------------------   I have reviewed the triage vital signs and the nursing notes.  HISTORY   Chief Complaint Leg Swelling   HPI Alexis Christensen is a 48 y.o. female with a history of anemia, anxiety, bipolar disorder, CHF, DVT, migraines who presents to the ED for peripheral edema.  Patient arrives reporting that since Sunday she was having swelling to her legs and pain.  She saw her primary care doctor yesterday and her Lasix was increased without relief.  She denies any other accompanying symptoms.  Past Medical History:  Diagnosis Date  . Adopted   . Anemia   . Anxiety   . Arthritis   . Bipolar 1 disorder ([place])   . Carpal tunnel syndrome 09/15/2014  . CHF (congestive heart failure) (HCC)    Dialostic CHF  . Chronic diarrhea 09/07/2015  . Chronic kidney disease    H/O KIDNEY STONES  . Chronic venous insufficiency 04/26/2016  . Depression   . DVT (deep venous thrombosis) ([place]) 2016   RIGHT LEG  . Edema leg   . Effusion of knee 12/30/2013  . Family history of adverse reaction to anesthesia    ADOPTED  . GERD (gastroesophageal reflux disease)   . 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 Headache(784.0)    MIGRAINES  . Heart burn   . Heart murmur   . History of kidney stones   . HLD (hyperlipidemia)   . Hx MRSA infection   . Hypothyroidism   . Lipoma of arm 05/11/2013  . Lower extremity edema   . Lymphedema 04/26/2016  . Post traumatic stress disorder (PTSD)   . PVD (peripheral vascular disease) ([place])   . Seasonal allergies   . Stress incontinence   . Thyroid disease   . Urge incontinence     Patient Active Problem List   Diagnosis Date Noted  . Bilateral inguinal hernia without obstruction or gangrene   . Redness of eye, left 08/22/2016  . Iron deficiency anemia 07/11/2016  . ANA  positive 06/13/2016  . Bilateral hand pain 06/13/2016  . Lymphedema 04/26/2016  . Chronic venous insufficiency 04/26/2016  . Swelling of limb 04/26/2016  . Chronic diarrhea 09/07/2015  . Fever 09/07/2015  . Hypothyroidism 09/07/2015  . Hypovitaminosis D 09/07/2015  . Obesity 09/07/2015  . Weight loss, unintentional 09/07/2015  . Bipolar mixed affective disorder, moderate ([place]) 09/05/2015  . SUI (stress urinary incontinence, female) 05/18/2015  . Pain in shoulder 09/15/2014  . Carpal tunnel syndrome 09/15/2014  . Chronic left shoulder pain 09/15/2014  . Effusion of knee 12/30/2013  . 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 Gonalgia 12/30/2013  . Lipoma of arm 05/11/2013    Past Surgical History:  Procedure Laterality Date  . ABDOMINAL HYSTERECTOMY    . ANKLE SURGERY    . CHOLECYSTECTOMY    . CYSTOSCOPY N/A 04/09/2016   Procedure: CYSTOSCOPY;  Surgeon: Bjorn Loser, MD;  Location: [facility] ORS;  Service: Urology;  Laterality: N/A;  . DILATION AND CURETTAGE OF UTERUS    . INGUINAL HERNIA REPAIR Bilateral 04/18/2017   Procedure: LAPAROSCOPIC BILATERAL INGUINAL HERNIA REPAIR;  Surgeon: Jules Husbands, MD;  Location: [facility] ORS;  Service: General;  Laterality: Bilateral;  . INTERSTIM IMPLANT PLACEMENT N/A 11/26/2016   Procedure: Barrie Lyme IMPLANT FIRST STAGE;  Surgeon: Bjorn Loser, MD;  Location: [facility] ORS;  Service: Urology;  Laterality: N/A;  . INTERSTIM IMPLANT  PLACEMENT N/A 11/26/2016   Procedure: Barrie Lyme IMPLANT SECOND STAGE;  Surgeon: Bjorn Loser, MD;  Location: [facility] ORS;  Service: Urology;  Laterality: N/A;  . JOINT REPLACEMENT Right    knee  . KNEE ARTHROSCOPY Bilateral   . KNEE ARTHROSCOPY WITH LATERAL RELEASE Left 10/18/2015   Procedure: KNEE ARTHROSCOPY LATERAL AND PARTIAL SYNOVECTOMY;  Surgeon: Hessie Knows, MD;  Location: [facility] ORS;  Service: Orthopedics;  Laterality: Left;  . Lymph Node removal  2015   Neck  . PUBOVAGINAL SLING N/A 04/09/2016   Procedure:  PUBO-VAGINAL SLING/ RETROPUBIC SLING;  Surgeon: Bjorn Loser, MD;  Location: [facility] ORS;  Service: Urology;  Laterality: N/A;  . REPLACEMENT TOTAL KNEE Right   . SHOULDER SURGERY Right 2014  . TUBAL LIGATION    . WRIST SURGERY Right    metal plate    Allergies Geodon [ziprasidone hydrochloride], Sulfacetamide sodium, Ziprasidone hcl, Sulfa antibiotics, Ace inhibitors, Erythromycin, Hydromorphone, Lamictal [lamotrigine], and Strawberry (diagnostic)  Social History Social History   Tobacco Use  . Smoking status: Former Smoker    Packs/day: 0.25    Years: 20.00    Pack years: 5.00    Types: Cigarettes    Quit date: 11/13/2012    Years since quitting: 6.2  . Smokeless tobacco: Former Systems developer  . Tobacco comment: STRESS RELATED  Substance Use Topics  . Alcohol use: Not Currently    Alcohol/week: 0.0 standard drinks    Frequency: Never    Comment: occasional  . Drug use: No    Review of Systems Constitutional: Negative for fever. Cardiovascular: Negative for chest pain. Respiratory: Negative for shortness of breath. Gastrointestinal: Negative for abdominal pain, vomiting and diarrhea. Musculoskeletal: Positive for bilateral edema Skin: Negative for rash. Neurological: Negative for headaches, focal weakness or numbness.  All systems negative/normal/unremarkable except as stated in the HPI  ____________________________________________   PHYSICAL EXAM:  VITAL SIGNS: ED Triage Vitals  Enc Vitals Group     BP 01/28/19 0127 115/67     Pulse Rate 01/28/19 0127 89     Resp 01/28/19 0127 18     Temp 01/28/19 0127 99.8 F (37.7 C)     Temp Source 01/28/19 0127 Oral     SpO2 01/28/19 0127 95 %     Weight 01/28/19 0124 210 lb (95.3 kg)     Height 01/28/19 0124 5\' 4"  (1.626 m)     Head Circumference --      Peak Flow --      Pain Score 01/28/19 0124 9     Pain Loc --      Pain Edu? --      Excl. in [place]? --     Constitutional: Alert and oriented. Well appearing and in no  distress. Eyes: Conjunctivae are normal. Normal extraocular movements. Cardiovascular: Normal rate, regular rhythm. No murmurs, rubs, or gallops. Respiratory: Normal respiratory effort without tachypnea nor retractions. Breath sounds are clear and equal bilaterally. No wheezes/rales/rhonchi. Gastrointestinal: Soft and nontender. Normal bowel sounds Musculoskeletal: Nontender with normal range of motion in extremities.  Bilateral edema is noted Neurologic:  Normal speech and language. No gross focal neurologic deficits are appreciated.  Skin:  Skin is warm, dry and intact. No rash noted. Psychiatric: Mood and affect are normal. Speech and behavior are normal.   ____________________________________________  ED COURSE:  As part of my medical decision making, I reviewed the following data within the [place] History obtained from family if available, nursing notes, old chart and ekg, as well as notes  from prior ED visits. Patient presented for leg swelling, we will assess with labs and imaging as indicated at this time.   Procedures  Markel MAKEYLA GOVAN was evaluated in Emergency Department on 01/28/2019 for the symptoms described in the history of present illness. She was evaluated in the context of the global COVID-19 pandemic, which necessitated consideration that the patient might be at risk for infection with the SARS-CoV-2 virus that causes COVID-19. Institutional protocols and algorithms that pertain to the evaluation of patients at risk for COVID-19 are in a state of rapid change based on information released by regulatory bodies including the CDC and federal and state organizations. These policies and algorithms were followed during the patient's care in the ED.  ____________________________________________   LABS (pertinent positives/negatives)  Labs Reviewed  CBC WITH DIFFERENTIAL/PLATELET - Abnormal; Notable for the following components:      Result Value   WBC 12.0 (*)     Neutro Abs 9.5 (*)    All other components within normal limits  COMPREHENSIVE METABOLIC PANEL - Abnormal; Notable for the following components:   Sodium 134 (*)    Glucose, Bld 106 (*)    Calcium 8.4 (*)    AST 89 (*)    ALT 90 (*)    Total Bilirubin 1.8 (*)    All other components within normal limits    RADIOLOGY  Bilateral lower extremity ultrasound Was negative for DVT ____________________________________________   DIFFERENTIAL DIAGNOSIS   Lymphedema, DVT, diastolic heart failure, cirrhosis  FINAL ASSESSMENT AND PLAN  Peripheral edema   Plan: The patient had presented for peripheral edema. Patient's labs did not reveal any acute process. Patient's imaging was negative for DVT.  I will encourage doubling her Lasix for several days to see if this improves her swelling.  I will also encourage compression stockings.   Laurence Aly, MD    Note: This note was generated in part or whole with voice recognition software. Voice recognition is usually quite accurate but there are transcription errors that can and very often do occur. I apologize for any typographical errors that were not detected and corrected.     Earleen Newport, MD 01/28/19 [phone]

## 2019-01-28 NOTE — ED Triage Notes (Signed)
Pt to triage via w/c with no distress noted, mask in place; pt reports since Sunday having swelling to legs and pain; seen PCP yesterday and lasix increased without relief; pt denies any accomp symptoms

## 2019-01-28 NOTE — ED Notes (Signed)
VS obtained by this RN. This RN rounded on patient, apologized and explained delay to patient. Pt states, "how much longer is going to be?" This RN explained change of shift and re-obtaining VS. Pt states understanding. Pt is alert and oriented. Will continue to monitor for further patient needs.

## 2019-01-28 NOTE — ED Notes (Addendum)
Pt with BLE edema since Sunday. +2 pulses. NAD. vss  Call Bell within reach, stretcher in low position, warm blanket given.

## 2019-04-06 ENCOUNTER — Other Ambulatory Visit
Admission: RE | Admit: 2019-04-06 | Discharge: 2019-04-06 | Disposition: A | Discharge status: Home / Self Care | Payer: Medicare Other | Source: Ambulatory Visit | Attending: Student | Admitting: Student

## 2019-04-06 DIAGNOSIS — A498 Other bacterial infections of unspecified site: Secondary | ICD-10-CM

## 2019-04-06 DIAGNOSIS — K529 Noninfective gastroenteritis and colitis, unspecified: Secondary | ICD-10-CM | POA: Diagnosis present

## 2019-04-06 DIAGNOSIS — K625 Hemorrhage of anus and rectum: Secondary | ICD-10-CM | POA: Insufficient documentation

## 2019-04-06 HISTORY — DX: Other bacterial infections of unspecified site: A49.8

## 2019-04-06 LAB — C DIFFICILE QUICK SCREEN W PCR REFLEX
C Diff antigen: POSITIVE — AB
C Diff toxin: NEGATIVE

## 2019-04-06 LAB — CLOSTRIDIUM DIFFICILE BY PCR, REFLEXED: Toxigenic C. Difficile by PCR: POSITIVE — AB

## 2019-04-10 LAB — GI PATHOGEN PANEL BY PCR, STOOL
Adenovirus F 40/41: NOT DETECTED
Astrovirus: NOT DETECTED
C difficile toxin A/B: DETECTED — AB
Campylobacter by PCR: DETECTED — AB
Cryptosporidium by PCR: NOT DETECTED
Cyclospora cayetanensis: NOT DETECTED
E coli (ETEC) LT/ST: NOT DETECTED
E coli (STEC): NOT DETECTED
Entamoeba histolytica: NOT DETECTED
Enteroaggregative E coli: NOT DETECTED
Enteropathogenic E coli: DETECTED — AB
G lamblia by PCR: NOT DETECTED
Norovirus GI/GII: NOT DETECTED
Plesiomonas shigelloides: NOT DETECTED
Rotavirus A by PCR: NOT DETECTED
Salmonella by PCR: NOT DETECTED
Sapovirus: NOT DETECTED
Shigella by PCR: NOT DETECTED
Vibrio cholerae: NOT DETECTED
Vibrio: NOT DETECTED
Yersinia enterocolitica: NOT DETECTED

## 2019-04-13 LAB — PANCREATIC ELASTASE, FECAL: Pancreatic Elastase-1, Stool: >500 ug Elast./g (ref >200)

## 2019-04-15 LAB — CALPROTECTIN, FECAL: Calprotectin, Fecal: 95 ug/g (ref 0–120)

## 2019-05-11 ENCOUNTER — Emergency Department
Admission: EM | Admit: 2019-05-11 | Discharge: 2019-05-12 | Disposition: A | Discharge status: Home / Self Care | Payer: Medicare Other | Attending: Emergency Medicine | Admitting: Emergency Medicine

## 2019-05-11 ENCOUNTER — Encounter: Payer: Self-pay | Admitting: Emergency Medicine

## 2019-05-11 ENCOUNTER — Ambulatory Visit (INDEPENDENT_AMBULATORY_CARE_PROVIDER_SITE_OTHER): Payer: Medicare Other | Admitting: Vascular Surgery

## 2019-05-11 DIAGNOSIS — I5030 Unspecified diastolic (congestive) heart failure: Secondary | ICD-10-CM | POA: Diagnosis not present

## 2019-05-11 DIAGNOSIS — Z96651 Presence of right artificial knee joint: Secondary | ICD-10-CM | POA: Insufficient documentation

## 2019-05-11 DIAGNOSIS — E039 Hypothyroidism, unspecified: Secondary | ICD-10-CM | POA: Insufficient documentation

## 2019-05-11 DIAGNOSIS — Z79899 Other long term (current) drug therapy: Secondary | ICD-10-CM | POA: Diagnosis not present

## 2019-05-11 DIAGNOSIS — N189 Chronic kidney disease, unspecified: Secondary | ICD-10-CM | POA: Diagnosis not present

## 2019-05-11 DIAGNOSIS — Z87891 Personal history of nicotine dependence: Secondary | ICD-10-CM | POA: Diagnosis not present

## 2019-05-11 DIAGNOSIS — I82409 Acute embolism and thrombosis of unspecified deep veins of unspecified lower extremity: Secondary | ICD-10-CM | POA: Diagnosis not present

## 2019-05-11 DIAGNOSIS — R1032 Left lower quadrant pain: Secondary | ICD-10-CM | POA: Diagnosis present

## 2019-05-11 DIAGNOSIS — N390 Urinary tract infection, site not specified: Secondary | ICD-10-CM

## 2019-05-11 DIAGNOSIS — N3090 Cystitis, unspecified without hematuria: Secondary | ICD-10-CM | POA: Insufficient documentation

## 2019-05-11 LAB — COMPREHENSIVE METABOLIC PANEL
ALT: 15 U/L (ref 0–44)
AST: 16 U/L (ref 15–41)
Albumin: 4 g/dL (ref 3.5–5.0)
Alkaline Phosphatase: 73 U/L (ref 38–126)
Anion gap: 9 (ref 5–15)
BUN: 16 mg/dL (ref 6–20)
CO2: 29 mmol/L (ref 22–32)
Calcium: 9.4 mg/dL (ref 8.9–10.3)
Chloride: 103 mmol/L (ref 98–111)
Creatinine, Ser: 0.64 mg/dL (ref 0.44–1.00)
GFR calc Af Amer: >60 mL/min (ref >60)
GFR calc non Af Amer: >60 mL/min (ref >60)
Glucose, Bld: 132 mg/dL — ABNORMAL HIGH (ref 70–99)
Potassium: 4 mmol/L (ref 3.5–5.1)
Sodium: 141 mmol/L (ref 135–145)
Total Bilirubin: 0.5 mg/dL (ref 0.3–1.2)
Total Protein: 7.2 g/dL (ref 6.5–8.1)

## 2019-05-11 LAB — URINALYSIS, COMPLETE (UACMP) WITH MICROSCOPIC
Bacteria, UA: NONE SEEN
Bilirubin Urine: NEGATIVE
Glucose, UA: NEGATIVE mg/dL
Hgb urine dipstick: NEGATIVE
Ketones, ur: NEGATIVE mg/dL
Nitrite: NEGATIVE
Protein, ur: NEGATIVE mg/dL
Specific Gravity, Urine: 1.026 (ref 1.005–1.030)
pH: 5 (ref 5.0–8.0)

## 2019-05-11 LAB — CBC
HCT: 39.2 % (ref 36.0–46.0)
Hemoglobin: 12.9 g/dL (ref 12.0–15.0)
MCH: 30.1 pg (ref 26.0–34.0)
MCHC: 32.9 g/dL (ref 30.0–36.0)
MCV: 91.4 fL (ref 80.0–100.0)
Platelets: 263 10*3/uL (ref 150–400)
RBC: 4.29 MIL/uL (ref 3.87–5.11)
RDW: 11.8 % (ref 11.5–15.5)
WBC: 8.4 10*3/uL (ref 4.0–10.5)
nRBC: 0 % (ref 0.0–0.2)

## 2019-05-11 LAB — LIPASE, BLOOD: Lipase: 26 U/L (ref 11–51)

## 2019-05-11 NOTE — ED Triage Notes (Signed)
Pt c/o lower left abdominal pain that radiates into back x1 day. Pt denies N/V/D.

## 2019-05-12 ENCOUNTER — Emergency Department: Payer: Medicare Other

## 2019-05-12 ENCOUNTER — Encounter: Payer: Self-pay | Admitting: Radiology

## 2019-05-12 MED ORDER — CEPHALEXIN 500 MG PO CAPS: 500.0000 mg | ORAL_CAPSULE | Freq: Two times a day (BID) | ORAL | 0 refills | Status: AC

## 2019-05-12 MED ORDER — IOHEXOL 300 MG/ML  SOLN
100.0000 mL | Freq: Once | INTRAMUSCULAR | Status: AC | PRN
  Administered 2019-05-12: 100 mL via INTRAVENOUS

## 2019-05-12 MED ORDER — LIDOCAINE 5 % EX PTCH: 1.0000 | MEDICATED_PATCH | Freq: Two times a day (BID) | CUTANEOUS | 0 refills | Status: DC

## 2019-05-12 NOTE — ED Notes (Signed)
Lab states they will add-on urine culture.

## 2019-05-12 NOTE — ED Provider Notes (Signed)
[facility] Emergency Department Provider Note   ____________________________________________   First MD Initiated Contact with Patient 05/12/19 0002     (approximate)  I have reviewed the triage vital signs and the nursing notes.   HISTORY  Chief Complaint Abdominal Pain and Back Pain    HPI Alexis Christensen is a 48 y.o. female with past medical history listed below who presents to the ED complaining of abdominal pain and back pain.  Patient reports she has been dealing with increasing pain in her left lower quadrant as well as her left back for the past 24 to 48 hours.  It seems to start in her abdomen and radiate into her back, described as sharp and not exacerbated or alleviated by anything.  She has not had any fevers, chills, nausea, vomiting, or diarrhea.  She also denies any dysuria or hematuria, but states it has been difficult for her to urinate and she has had to "push urine out".        Past Medical History:  Diagnosis Date  . Adopted   . Anemia   . Anxiety   . Arthritis   . Bipolar 1 disorder ([place])   . Carpal tunnel syndrome 09/15/2014  . CHF (congestive heart failure) (HCC)    Dialostic CHF  . Chronic diarrhea 09/07/2015  . Chronic kidney disease    H/O KIDNEY STONES  . Chronic venous insufficiency 04/26/2016  . Depression   . DVT (deep venous thrombosis) ([place]) 2016   RIGHT LEG  . Edema leg   . Effusion of knee 12/30/2013  . Family history of adverse reaction to anesthesia    ADOPTED  . GERD (gastroesophageal reflux disease)   . 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 Headache(784.0)    MIGRAINES  . Heart burn   . Heart murmur   . History of kidney stones   . HLD (hyperlipidemia)   . Hx MRSA infection   . Hypothyroidism   . Lipoma of arm 05/11/2013  . Lower extremity edema   . Lymphedema 04/26/2016  . Post traumatic stress disorder (PTSD)   . PVD (peripheral vascular disease) ([place])   . Seasonal allergies   . Stress  incontinence   . Thyroid disease   . Urge incontinence     Patient Active Problem List   Diagnosis Date Noted  . Bilateral inguinal hernia without obstruction or gangrene   . Redness of eye, left 08/22/2016  . Iron deficiency anemia 07/11/2016  . ANA positive 06/13/2016  . Bilateral hand pain 06/13/2016  . Lymphedema 04/26/2016  . Chronic venous insufficiency 04/26/2016  . Swelling of limb 04/26/2016  . Chronic diarrhea 09/07/2015  . Fever 09/07/2015  . Hypothyroidism 09/07/2015  . Hypovitaminosis D 09/07/2015  . Obesity 09/07/2015  . Weight loss, unintentional 09/07/2015  . Bipolar mixed affective disorder, moderate ([place]) 09/05/2015  . SUI (stress urinary incontinence, female) 05/18/2015  . Pain in shoulder 09/15/2014  . Carpal tunnel syndrome 09/15/2014  . Chronic left shoulder pain 09/15/2014  . Effusion of knee 12/30/2013  . 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 Gonalgia 12/30/2013  . Lipoma of arm 05/11/2013    Past Surgical History:  Procedure Laterality Date  . ABDOMINAL HYSTERECTOMY    . ANKLE SURGERY    . CHOLECYSTECTOMY    . CYSTOSCOPY N/A 04/09/2016   Procedure: CYSTOSCOPY;  Surgeon: Bjorn Loser, MD;  Location: [facility] ORS;  Service: Urology;  Laterality: N/A;  . DILATION AND CURETTAGE OF UTERUS    .  INGUINAL HERNIA REPAIR Bilateral 04/18/2017   Procedure: LAPAROSCOPIC BILATERAL INGUINAL HERNIA REPAIR;  Surgeon: Jules Husbands, MD;  Location: [facility] OR[facility];  Service: General;  Laterality: Bilateral;  . INTERSTIM IMPLANT PLACEMENT N/A 11/26/2016   Procedure: Barrie Lyme IMPLANT FIRST STAGE;  Surgeon: Bjorn Loser, MD;  Location: [facility];  Service: Urology;  Laterality: N/A;  . INTERSTIM IMPLANT PLACEMENT N/A 11/26/2016   Procedure: Barrie Lyme IMPLANT SECOND STAGE;  Surgeon: Bjorn Loser, MD;  Location: [facility];  Service: Urology;  Laterality: N/A;  . JOINT REPLACEMENT Right    knee  . KNEE ARTHROSCOPY Bilateral   . KNEE ARTHROSCOPY WITH LATERAL RELEASE  Left 10/18/2015   Procedure: KNEE ARTHROSCOPY LATERAL AND PARTIAL SYNOVECTOMY;  Surgeon: Hessie Knows, MD;  Location: [facility] OR[facility];  Service: Orthopedics;  Laterality: Left;  . Lymph Node removal  2015   Neck  . PUBOVAGINAL SLING N/A 04/09/2016   Procedure: PUBO-VAGINAL SLING/ RETROPUBIC SLING;  Surgeon: Bjorn Loser, MD;  Location: [facility];  Service: Urology;  Laterality: N/A;  . REPLACEMENT TOTAL KNEE Right   . SHOULDER SURGERY Right 2014  . TUBAL LIGATION    . WRIST SURGERY Right    metal plate    Prior to Admission medications   Medication Sig Start Date End Date Taking? Authorizing Provider  ARIPiprazole (ABILIFY MAINTENA IM) Inject 400 mg into the muscle every 30 (thirty) days.    [provider]  cephALEXin (KEFLEX) 500 MG capsule Take 1 capsule (500 mg total) by mouth 2 (two) times daily for 10 days. 05/12/19 05/22/19  Blake Divine, MD  clobetasol cream (TEMOVATE) [id number] % Apply 1 application topically 2 (two) times daily as needed (for rash).     [provider]  colestipol (COLESTID) 1 g tablet Take 2 g by mouth daily at 12 noon.     [provider]  cyclobenzaprine (FLEXERIL) 10 MG tablet Take 10 mg by mouth 2 (two) times daily as needed for muscle spasms.  03/29/17   [provider]  diclofenac sodium (VOLTAREN) 1 % GEL Apply 2 g topically 4 (four) times daily. 12/23/17   Laban Emperor, PA-C  DULoxetine (CYMBALTA) 60 MG capsule Take 60 mg by mouth at bedtime.     [provider]  fluticasone (FLONASE) 50 MCG/ACT nasal spray Place 2 sprays into both nostrils daily.     [provider]  gabapentin (NEURONTIN) 600 MG tablet Take 600 mg by mouth 2 (two) times daily.     [provider]  hydrOXYzine (ATARAX/VISTARIL) 25 MG tablet Take 50 mg by mouth every 8 (eight) hours as needed for itching.    [provider]  levocetirizine (XYZAL) 5 MG tablet Take 5 mg by mouth every evening.    [provider]   levothyroxine (SYNTHROID, LEVOTHROID) 150 MCG tablet Take 150 mcg by mouth daily before breakfast.  02/24/16   [provider]  ondansetron (ZOFRAN ODT) 4 MG disintegrating tablet Take 1 tablet (4 mg total) by mouth every 8 (eight) hours as needed for nausea or vomiting. 01/25/18   Paulette Blanch, MD  prazosin (MINIPRESS) 2 MG capsule Take 2 mg by mouth at bedtime.    [provider]  ranitidine (ZANTAC) 150 MG tablet Take 150 mg by mouth 2 (two) times daily.    [provider]  SUBOXONE 8-2 MG FILM Dissolve 1 film under tongue twice daily 09/14/16   [provider]    Allergies Geodon [ziprasidone hydrochloride], Sulfacetamide sodium, Ziprasidone hcl, Sulfa antibiotics, Ace inhibitors,  Erythromycin, Hydromorphone, Lamictal [lamotrigine], and Strawberry (diagnostic)  Family History  Adopted: Yes  Family history unknown: Yes    Social History Social History   Tobacco Use  . Smoking status: Former Smoker    Packs/day: 0.25    Years: 20.00    Pack years: 5.00    Types: Cigarettes    Quit date: 11/13/2012    Years since quitting: 6.4  . Smokeless tobacco: Former Systems developer  . Tobacco comment: STRESS RELATED  Substance Use Topics  . Alcohol use: Not Currently    Alcohol/week: 0.0 standard drinks    Frequency: Never    Comment: occasional  . Drug use: No    Review of Systems  Constitutional: No fever/chills Eyes: No visual changes. ENT: No sore throat. Cardiovascular: Denies chest pain. Respiratory: Denies shortness of breath. Gastrointestinal: Positive for abdominal pain.  No nausea, no vomiting.  No diarrhea.  No constipation. Genitourinary: Negative for dysuria. Musculoskeletal: Positive for back pain. Skin: Negative for rash. Neurological: Negative for headaches, focal weakness or numbness.  ____________________________________________   PHYSICAL EXAM:  VITAL SIGNS: ED Triage Vitals [05/11/19 1911]  Enc Vitals Group     BP 113/65      Pulse Rate 64     Resp 17     Temp 98.6 F (37 C)     Temp Source Oral     SpO2 98 %     Weight      Height      Head Circumference      Peak Flow      Pain Score      Pain Loc      Pain Edu?      Excl. in [place]?     Constitutional: Alert and oriented. Eyes: Conjunctivae are normal. Head: Atraumatic. Nose: No congestion/rhinnorhea. Mouth/Throat: Mucous membranes are moist. Neck: Normal ROM Cardiovascular: Normal rate, regular rhythm. Grossly normal heart sounds. Respiratory: Normal respiratory effort.  No retractions. Lungs CTAB. Gastrointestinal: Soft and tender to palpation in the left lower quadrant with no rebound or guarding. No distention.  No CVA tenderness bilaterally. Genitourinary: deferred Musculoskeletal: No lower extremity tenderness nor edema.  Left lower back tenderness to palpation, no midline spinal tenderness. Neurologic:  Normal speech and language. No gross focal neurologic deficits are appreciated. Skin:  Skin is warm, dry and intact. No rash noted. Psychiatric: Mood and affect are normal. Speech and behavior are normal.  ____________________________________________   LABS (all labs ordered are listed, but only abnormal results are displayed)  Labs Reviewed  COMPREHENSIVE METABOLIC PANEL - Abnormal; Notable for the following components:      Result Value   Glucose, Bld 132 (*)    All other components within normal limits  URINALYSIS, COMPLETE (UACMP) WITH MICROSCOPIC - Abnormal; Notable for the following components:   Color, Urine YELLOW (*)    APPearance HAZY (*)    Leukocytes,Ua MODERATE (*)    All other components within normal limits  URINE CULTURE  LIPASE, BLOOD  CBC    PROCEDURES  Procedure(s) performed (including Critical Care):  Procedures   ____________________________________________   INITIAL IMPRESSION / ASSESSMENT AND PLAN / ED COURSE       48 year old female presents to the ED complaining of left lower quadrant abdominal  pain radiating into her left lower back for the past 24 to 48 hours.  CT was obtained to assess for diverticulitis versus nephrolithiasis, however it was negative for acute process.  Her labs were also unremarkable.  Doubt acute spinal pathology,  she is neurologically intact to her bilateral lower extremities, has not had any saddle anesthesia or urinary retention.  Given the location of her symptoms and her urinary symptoms, will treat for UTI, counseled to follow-up with PCP and return to the ED for new or worsening symptoms.  Patient agrees with plan.      ____________________________________________   FINAL CLINICAL IMPRESSION(S) / ED DIAGNOSES  Final diagnoses:  Urinary tract infection without hematuria, site unspecified     ED Discharge Orders         Ordered    lidocaine (LIDODERM) 5 %  Every 12 hours,   Status:  Discontinued     05/12/19 0209    cephALEXin (KEFLEX) 500 MG capsule  2 times daily     05/12/19 0217           Note:  This document was prepared using Dragon voice recognition software and may include unintentional dictation errors.   Blake Divine, MD 05/12/19 Reece Agar

## 2019-05-13 LAB — URINE CULTURE: Culture: <10000 — AB

## 2019-05-14 ENCOUNTER — Ambulatory Visit (INDEPENDENT_AMBULATORY_CARE_PROVIDER_SITE_OTHER): Payer: Medicare Other | Admitting: Nurse Practitioner

## 2019-05-14 ENCOUNTER — Encounter (INDEPENDENT_AMBULATORY_CARE_PROVIDER_SITE_OTHER): Payer: Self-pay | Admitting: Nurse Practitioner

## 2019-05-14 ENCOUNTER — Other Ambulatory Visit: Payer: Self-pay

## 2019-05-14 VITALS — BP 105/68 | HR 53 | Resp 16 | Wt 215.2 lb

## 2019-05-14 DIAGNOSIS — I890 Lymphedema, not elsewhere classified: Secondary | ICD-10-CM | POA: Diagnosis not present

## 2019-05-14 NOTE — Progress Notes (Signed)
SUBJECTIVE:  Patient ID: Alexis Christensen, female    DOB: June 13, 1971, 48 y.o.   MRN: [id number] Chief Complaint  Patient presents with  . Follow-up    HPI  Alexis Christensen is a 48 y.o. female  The patient returns to the office for followup evaluation regarding leg swelling.  The swelling has persisted but with the lymph pump the patient states the swelling is much better controlled. The pain associated with swelling is essentially eliminated. There have not been any interval development of a ulcerations or wounds.  No episodes of cellulitis or infection over the past 24 months  The patient denies problems with the pump, noting it is working well and the leggings are in good condition.  Since the previous visit the patient has been wearing graduated compression stockings and using the lymph pump on a routine basis and  has noted significant improvement in the lymphedema.   Patient stated the lymph pump has been a very positive factor in her care.        Past Medical History:  Diagnosis Date  . Adopted   . Anemia   . Anxiety   . Arthritis   . Bipolar 1 disorder ([place])   . Carpal tunnel syndrome 09/15/2014  . CHF (congestive heart failure) (HCC)    Dialostic CHF  . Chronic diarrhea 09/07/2015  . Chronic kidney disease    H/O KIDNEY STONES  . Chronic venous insufficiency 04/26/2016  . Depression   . DVT (deep venous thrombosis) ([place]) 2016   RIGHT LEG  . Edema leg   . Effusion of knee 12/30/2013  . Family history of adverse reaction to anesthesia    ADOPTED  . GERD (gastroesophageal reflux disease)   . 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 Headache(784.0)    MIGRAINES  . Heart burn   . Heart murmur   . History of kidney stones   . HLD (hyperlipidemia)   . Hx MRSA infection   . Hypothyroidism   . Lipoma of arm 05/11/2013  . Lower extremity edema   . Lymphedema 04/26/2016  . Post traumatic stress disorder (PTSD)   . PVD (peripheral vascular disease) ([place])   . Seasonal  allergies   . Stress incontinence   . Thyroid disease   . Urge incontinence     Past Surgical History:  Procedure Laterality Date  . ABDOMINAL HYSTERECTOMY    . ANKLE SURGERY    . CHOLECYSTECTOMY    . CYSTOSCOPY N/A 04/09/2016   Procedure: CYSTOSCOPY;  Surgeon: Bjorn Loser, MD;  Location: [facility] ORS;  Service: Urology;  Laterality: N/A;  . DILATION AND CURETTAGE OF UTERUS    . INGUINAL HERNIA REPAIR Bilateral 04/18/2017   Procedure: LAPAROSCOPIC BILATERAL INGUINAL HERNIA REPAIR;  Surgeon: Jules Husbands, MD;  Location: [facility] ORS;  Service: General;  Laterality: Bilateral;  . INTERSTIM IMPLANT PLACEMENT N/A 11/26/2016   Procedure: Barrie Lyme IMPLANT FIRST STAGE;  Surgeon: Bjorn Loser, MD;  Location: [facility] ORS;  Service: Urology;  Laterality: N/A;  . INTERSTIM IMPLANT PLACEMENT N/A 11/26/2016   Procedure: Barrie Lyme IMPLANT SECOND STAGE;  Surgeon: Bjorn Loser, MD;  Location: [facility] ORS;  Service: Urology;  Laterality: N/A;  . JOINT REPLACEMENT Right    knee  . KNEE ARTHROSCOPY Bilateral   . KNEE ARTHROSCOPY WITH LATERAL RELEASE Left 10/18/2015   Procedure: KNEE ARTHROSCOPY LATERAL AND PARTIAL SYNOVECTOMY;  Surgeon: Hessie Knows, MD;  Location: [facility] OR[facility];  Service: Orthopedics;  Laterality: Left;  . Lymph Node removal  2015  Neck  . PUBOVAGINAL SLING N/A 04/09/2016   Procedure: PUBO-VAGINAL SLING/ RETROPUBIC SLING;  Surgeon: Bjorn Loser, MD;  Location: [facility] ORS;  Service: Urology;  Laterality: N/A;  . REPLACEMENT TOTAL KNEE Right   . SHOULDER SURGERY Right 2014  . TUBAL LIGATION    . WRIST SURGERY Right    metal plate    Social History   Socioeconomic History  . Marital status: Divorced    Spouse name: Not on file  . Number of children: Not on file  . Years of education: Not on file  . Highest education level: Not on file  Occupational History  . Not on file  Social Needs  . Financial resource strain: Not on file  . Food insecurity    Worry: Not on file     Inability: Not on file  . Transportation needs    Medical: Not on file    Non-medical: Not on file  Tobacco Use  . Smoking status: Former Smoker    Packs/day: 0.25    Years: 20.00    Pack years: 5.00    Types: Cigarettes    Quit date: 11/13/2012    Years since quitting: 6.5  . Smokeless tobacco: Former Systems developer  . Tobacco comment: STRESS RELATED  Substance and Sexual Activity  . Alcohol use: Not Currently    Alcohol/week: 0.0 standard drinks    Frequency: Never    Comment: occasional  . Drug use: No  . Sexual activity: Not on file  Lifestyle  . Physical activity    Days per week: Not on file    Minutes per session: Not on file  . Stress: Not on file  Relationships  . Social Herbalist on phone: Not on file    Gets together: Not on file    Attends religious service: Not on file    Active member of club or organization: Not on file    Attends meetings of clubs or organizations: Not on file    Relationship status: Not on file  . Intimate partner violence    Fear of current or ex partner: Not on file    Emotionally abused: Not on file    Physically abused: Not on file    Forced sexual activity: Not on file  Other Topics Concern  . Not on file  Social History Narrative  . Not on file    Family History  Adopted: Yes  Family history unknown: Yes    Allergies  Allergen Reactions  . Geodon [Ziprasidone Hydrochloride] Other (See Comments)    Numbness ,sob, headaches, blurred vision  . Sulfacetamide Sodium Hives  . Ziprasidone Anaphylaxis    Other reaction(s): Other (See Comments) Numbness ,sob, headaches, blurred vision  Other reaction(s): Other (See Comments), Unknown Numbness ,sob, headaches, blurred vision Numbness ,sob, headaches, blurred vision  Other reaction(s): Other (See Comments), Unknown Numbness ,sob, headaches, blurred vision Numbness ,sob, headaches, blurred vision   Numbness ,sob, headaches, blurred vision Other reaction(s): Other (See  Comments), Unknown Numbness ,sob, headaches, blurred vision Numbness ,sob, headaches, blurred vision Other reaction(s): Other (See Comments), Unknown Numbness ,sob, headaches, blurred vision Numbness ,sob, headaches, blurred vision  . Ziprasidone Hcl Anaphylaxis and Other (See Comments)    Other reaction(s): Other (See Comments), Unknown Numbness ,sob, headaches, blurred vision Numbness ,sob, headaches, blurred vision  . Ace Inhibitors Rash  . Erythromycin Rash and Hives  . Hydromorphone Rash  . Lamictal [Lamotrigine] Rash    Other reaction(s): Unknown  . Strawberry (Diagnostic) Rash  .  Sulfa Antibiotics Hives and Rash    Other reaction(s): Unknown Other reaction(s): Unknown Other reaction(s): Unknown     Review of Systems   Review of Systems: Negative Unless Checked Constitutional: [] Weight loss  [] Fever  [] Chills Cardiac: [] Chest pain   []  Atrial Fibrillation  [] Palpitations   [] Shortness of breath when laying flat   [] Shortness of breath with exertion. [] Shortness of breath at rest Vascular:  [] Pain in legs with walking   [] Pain in legs with standing [] Pain in legs when laying flat   [] Claudication    [] Pain in feet when laying flat    [] History of DVT   [] Phlebitis   [x] Swelling in legs   [] Varicose veins   [] Non-healing ulcers Pulmonary:   [] Uses home oxygen   [] Productive cough   [] Hemoptysis   [] Wheeze  [] COPD   [] Asthma Neurologic:  [] Dizziness   [] Seizures  [] Blackouts [] History of stroke   [] History of TIA  [] Aphasia   [] Temporary Blindness   [] Weakness or numbness in arm   [] Weakness or numbness in leg Musculoskeletal:   [] Joint swelling   [] Joint pain   [] Low back pain  []  History of Knee Replacement [] Arthritis [] back Surgeries  []  Spinal Stenosis    Hematologic:  [] Easy bruising  [] Easy bleeding   [] Hypercoagulable state   [] Anemic Gastrointestinal:  [x] Diarrhea   [] Vomiting  [] Gastroesophageal reflux/heartburn   [] Difficulty swallowing. [] Abdominal pain Genitourinary:   [] Chronic kidney disease   [] Difficult urination  [] Anuric   [] Blood in urine [] Frequent urination  [] Burning with urination   [] Hematuria Skin:  [] Rashes   [] Ulcers [] Wounds Psychological:  [x] History of anxiety   [x]  History of major depression  []  Memory Difficulties      OBJECTIVE:   Physical Exam  BP 105/68 (BP Location: Right Arm)   Pulse (!) 53   Resp 16   Wt 215 lb 3.2 oz (97.6 kg)   BMI 36.94 kg/m   Gen: WD/WN, NAD Head: [place]/AT, No temporalis wasting.  Ear/Nose/Throat: Hearing grossly intact, nares w/o erythema or drainage Eyes: PER, EOMI, sclera nonicteric.  Neck: Supple, no masses.  No JVD.  Pulmonary:  Good air movement, no use of accessory muscles.  Cardiac: RRR Vascular: 2+ soft edema Vessel Right Left  Radial Palpable Palpable  Dorsalis Pedis Palpable Palpable  Posterior Tibial Palpable Palpable   Gastrointestinal: soft, non-distended. No guarding/no peritoneal signs.  Musculoskeletal: M/S 5/5 throughout.  No deformity or atrophy.  Neurologic: Pain and light touch intact in extremities.  Symmetrical.  Speech is fluent. Motor exam as listed above. Psychiatric: Judgment intact, Mood & affect appropriate for pt's clinical situation. Dermatologic: No Venous rashes. No Ulcers Noted.  No changes consistent with cellulitis. Lymph : No Cervical lymphadenopathy, no lichenification or skin changes of chronic lymphedema.       ASSESSMENT AND PLAN:  1. Lymphedema  No surgery or intervention at this point in time.    I have reviewed my discussion with the patient regarding lymphedema and why it  causes symptoms.  Patient will continue wearing graduated compression stockings class 1 (20-30 mmHg) on a daily basis a prescription was given. The patient is reminded to put the stockings on first thing in the morning and removing them in the evening. The patient is instructed specifically not to sleep in the stockings.   In addition, behavioral modification throughout the day  will be continued.  This will include frequent elevation (such as in a recliner), use of over the counter pain medications as needed and exercise such  as walking.  I have reviewed systemic causes for chronic edema such as liver, kidney and cardiac etiologies and there does not appear to be any significant changes in these organ systems over the past year.  The patient is under the impression that these organ systems are all stable and unchanged.    The patient will continue aggressive use of the  lymph pump.  This will continue to improve the edema control and prevent sequela such as ulcers and infections.   The patient will follow-up with me in 24 months.     Current Outpatient Medications on File Prior to Visit  Medication Sig Dispense Refill  . ARIPiprazole (ABILIFY MAINTENA IM) Inject 400 mg into the muscle every 30 (thirty) days.    . cyclobenzaprine (FLEXERIL) 10 MG tablet Take 10 mg by mouth 2 (two) times daily as needed for muscle spasms.   0  . diclofenac sodium (VOLTAREN) 1 % GEL Apply 2 g topically 4 (four) times daily. 100 g 0  . DULoxetine (CYMBALTA) 60 MG capsule Take 60 mg by mouth at bedtime.     . furosemide (LASIX) 20 MG tablet Take 20 mg by mouth 2 (two) times daily.    Marland Kitchen gabapentin (NEURONTIN) 600 MG tablet Take 600 mg by mouth 2 (two) times daily.     Marland Kitchen levocetirizine (XYZAL) 5 MG tablet Take 5 mg by mouth every evening.    Marland Kitchen levothyroxine (SYNTHROID, LEVOTHROID) 150 MCG tablet Take 150 mcg by mouth daily before breakfast.     . SUBOXONE 8-2 MG FILM Dissolve 1 film under tongue twice daily  0  . cephALEXin (KEFLEX) 500 MG capsule Take 1 capsule (500 mg total) by mouth 2 (two) times daily for 10 days. (Patient not taking: Reported on 05/14/2019) 20 capsule 0  . clobetasol cream (TEMOVATE) AB-123456789 % Apply 1 application topically 2 (two) times daily as needed (for rash).     . colestipol (COLESTID) 1 g tablet Take 2 g by mouth daily at 12 noon.     . fluticasone (FLONASE) 50  MCG/ACT nasal spray Place 2 sprays into both nostrils daily.     . hydrOXYzine (ATARAX/VISTARIL) 25 MG tablet Take 50 mg by mouth every 8 (eight) hours as needed for itching.    . ondansetron (ZOFRAN ODT) 4 MG disintegrating tablet Take 1 tablet (4 mg total) by mouth every 8 (eight) hours as needed for nausea or vomiting. (Patient not taking: Reported on 05/14/2019) 20 tablet 0  . prazosin (MINIPRESS) 2 MG capsule Take 2 mg by mouth at bedtime.    . ranitidine (ZANTAC) 150 MG tablet Take 150 mg by mouth 2 (two) times daily.     No current facility-administered medications on file prior to visit.     There are no Patient Instructions on file for this visit. No follow-ups on file.   Kris Hartmann, NP  This note was completed with Sales executive.  Any errors are purely unintentional.

## 2019-06-05 ENCOUNTER — Other Ambulatory Visit: Payer: Self-pay

## 2019-06-05 DIAGNOSIS — Z20822 Contact with and (suspected) exposure to covid-19: Secondary | ICD-10-CM

## 2019-06-08 LAB — NOVEL CORONAVIRUS, NAA: SARS-CoV-2, NAA: NOT DETECTED

## 2019-06-09 ENCOUNTER — Other Ambulatory Visit: Payer: Self-pay

## 2019-06-09 DIAGNOSIS — Z20822 Contact with and (suspected) exposure to covid-19: Secondary | ICD-10-CM

## 2019-06-10 LAB — NOVEL CORONAVIRUS, NAA: SARS-CoV-2, NAA: NOT DETECTED

## 2019-06-19 ENCOUNTER — Other Ambulatory Visit
Admission: RE | Admit: 2019-06-19 | Discharge: 2019-06-19 | Disposition: A | Discharge status: Home / Self Care | Payer: Medicare Other | Source: Ambulatory Visit | Attending: Internal Medicine | Admitting: Internal Medicine

## 2019-06-19 DIAGNOSIS — Z20828 Contact with and (suspected) exposure to other viral communicable diseases: Secondary | ICD-10-CM | POA: Insufficient documentation

## 2019-06-19 DIAGNOSIS — Z01812 Encounter for preprocedural laboratory examination: Secondary | ICD-10-CM | POA: Diagnosis present

## 2019-06-19 LAB — SARS CORONAVIRUS 2 (TAT 6-24 HRS): SARS Coronavirus 2: NEGATIVE

## 2019-06-24 ENCOUNTER — Encounter: Payer: Self-pay | Admitting: *Deleted

## 2019-06-24 ENCOUNTER — Ambulatory Visit: Payer: Medicare Other | Admitting: Anesthesiology

## 2019-06-24 ENCOUNTER — Encounter
Admission: RE | Disposition: A | Discharge status: Home / Self Care | Payer: Self-pay | Source: Home / Self Care | Attending: Internal Medicine

## 2019-06-24 ENCOUNTER — Ambulatory Visit
Admission: RE | Admit: 2019-06-24 | Discharge: 2019-06-24 | Disposition: A | Discharge status: Home / Self Care | Payer: Medicare Other | Attending: Internal Medicine | Admitting: Internal Medicine

## 2019-06-24 ENCOUNTER — Other Ambulatory Visit: Payer: Self-pay

## 2019-06-24 DIAGNOSIS — Z86718 Personal history of other venous thrombosis and embolism: Secondary | ICD-10-CM | POA: Insufficient documentation

## 2019-06-24 DIAGNOSIS — K449 Diaphragmatic hernia without obstruction or gangrene: Secondary | ICD-10-CM | POA: Diagnosis not present

## 2019-06-24 DIAGNOSIS — D122 Benign neoplasm of ascending colon: Secondary | ICD-10-CM | POA: Insufficient documentation

## 2019-06-24 DIAGNOSIS — E039 Hypothyroidism, unspecified: Secondary | ICD-10-CM | POA: Insufficient documentation

## 2019-06-24 DIAGNOSIS — Z8614 Personal history of Methicillin resistant Staphylococcus aureus infection: Secondary | ICD-10-CM | POA: Insufficient documentation

## 2019-06-24 DIAGNOSIS — K921 Melena: Secondary | ICD-10-CM | POA: Insufficient documentation

## 2019-06-24 DIAGNOSIS — G43909 Migraine, unspecified, not intractable, without status migrainosus: Secondary | ICD-10-CM | POA: Diagnosis not present

## 2019-06-24 DIAGNOSIS — R197 Diarrhea, unspecified: Secondary | ICD-10-CM | POA: Diagnosis present

## 2019-06-24 DIAGNOSIS — Z79890 Hormone replacement therapy (postmenopausal): Secondary | ICD-10-CM | POA: Diagnosis not present

## 2019-06-24 DIAGNOSIS — Z79891 Long term (current) use of opiate analgesic: Secondary | ICD-10-CM | POA: Insufficient documentation

## 2019-06-24 DIAGNOSIS — Z96659 Presence of unspecified artificial knee joint: Secondary | ICD-10-CM | POA: Insufficient documentation

## 2019-06-24 DIAGNOSIS — N189 Chronic kidney disease, unspecified: Secondary | ICD-10-CM | POA: Insufficient documentation

## 2019-06-24 DIAGNOSIS — I509 Heart failure, unspecified: Secondary | ICD-10-CM | POA: Insufficient documentation

## 2019-06-24 DIAGNOSIS — K228 Other specified diseases of esophagus: Secondary | ICD-10-CM | POA: Diagnosis not present

## 2019-06-24 DIAGNOSIS — Z79899 Other long term (current) drug therapy: Secondary | ICD-10-CM | POA: Diagnosis not present

## 2019-06-24 DIAGNOSIS — K219 Gastro-esophageal reflux disease without esophagitis: Secondary | ICD-10-CM | POA: Diagnosis not present

## 2019-06-24 DIAGNOSIS — K591 Functional diarrhea: Secondary | ICD-10-CM | POA: Diagnosis not present

## 2019-06-24 DIAGNOSIS — K2950 Unspecified chronic gastritis without bleeding: Secondary | ICD-10-CM | POA: Insufficient documentation

## 2019-06-24 DIAGNOSIS — E785 Hyperlipidemia, unspecified: Secondary | ICD-10-CM | POA: Insufficient documentation

## 2019-06-24 DIAGNOSIS — F329 Major depressive disorder, single episode, unspecified: Secondary | ICD-10-CM | POA: Diagnosis not present

## 2019-06-24 DIAGNOSIS — K5730 Diverticulosis of large intestine without perforation or abscess without bleeding: Secondary | ICD-10-CM | POA: Insufficient documentation

## 2019-06-24 DIAGNOSIS — I739 Peripheral vascular disease, unspecified: Secondary | ICD-10-CM | POA: Insufficient documentation

## 2019-06-24 DIAGNOSIS — K640 First degree hemorrhoids: Secondary | ICD-10-CM | POA: Diagnosis not present

## 2019-06-24 HISTORY — PX: COLONOSCOPY WITH PROPOFOL: SHX5780

## 2019-06-24 HISTORY — PX: ESOPHAGOGASTRODUODENOSCOPY (EGD) WITH PROPOFOL: SHX5813

## 2019-06-24 HISTORY — DX: Bipolar disorder, unspecified: F31.9

## 2019-06-24 HISTORY — DX: Nontoxic goiter, unspecified: E04.9

## 2019-06-24 SURGERY — ESOPHAGOGASTRODUODENOSCOPY (EGD) WITH PROPOFOL
Anesthesia: General

## 2019-06-24 MED ORDER — LIDOCAINE HCL (CARDIAC) PF 100 MG/5ML IV SOSY
PREFILLED_SYRINGE | INTRAVENOUS | Status: DC | PRN
  Administered 2019-06-24: 50 mg via INTRAVENOUS

## 2019-06-24 MED ORDER — EPHEDRINE SULFATE-NACL 50-0.9 MG/10ML-% IV SOSY
PREFILLED_SYRINGE | INTRAVENOUS | Status: DC | PRN
  Administered 2019-06-24: 15 mg via INTRAVENOUS

## 2019-06-24 MED ORDER — FENTANYL CITRATE (PF) 100 MCG/2ML IJ SOLN
INTRAMUSCULAR | Status: AC
  Filled 2019-06-24: qty 2

## 2019-06-24 MED ORDER — MIDAZOLAM HCL 2 MG/2ML IJ SOLN
INTRAMUSCULAR | Status: DC | PRN
  Administered 2019-06-24 (×2): 1 mg via INTRAVENOUS

## 2019-06-24 MED ORDER — PROPOFOL 10 MG/ML IV BOLUS
INTRAVENOUS | Status: DC | PRN
  Administered 2019-06-24: 90 mg via INTRAVENOUS

## 2019-06-24 MED ORDER — SODIUM CHLORIDE 0.9 % IV SOLN
INTRAVENOUS | Status: DC
  Administered 2019-06-24: 13:00:00 via INTRAVENOUS

## 2019-06-24 MED ORDER — PROPOFOL 500 MG/50ML IV EMUL
INTRAVENOUS | Status: AC
  Filled 2019-06-24: qty 50

## 2019-06-24 MED ORDER — FENTANYL CITRATE (PF) 100 MCG/2ML IJ SOLN
INTRAMUSCULAR | Status: DC | PRN
  Administered 2019-06-24: 25 ug via INTRAVENOUS
  Administered 2019-06-24: 50 ug via INTRAVENOUS

## 2019-06-24 MED ORDER — LIDOCAINE HCL (PF) 1 % IJ SOLN
INTRAMUSCULAR | Status: AC
  Administered 2019-06-24: 0.02 mL
  Filled 2019-06-24: qty 2

## 2019-06-24 MED ORDER — PROPOFOL 500 MG/50ML IV EMUL
INTRAVENOUS | Status: DC | PRN
  Administered 2019-06-24: 140 ug/kg/min via INTRAVENOUS

## 2019-06-24 MED ORDER — MIDAZOLAM HCL 2 MG/2ML IJ SOLN
INTRAMUSCULAR | Status: AC
  Filled 2019-06-24: qty 2

## 2019-06-24 NOTE — Op Note (Signed)
[facility] Patient Name: Alexis Christensen Procedure Date: 06/24/2019 1:13 PM MRN: [id number] Account #: [id number] Date of Birth: 07/13/1971 Admit Type: Outpatient Age: 48 Room: [facility] ENDO ROOM 3 Gender: Female Note Status: Finalized Procedure:             Upper GI endoscopy Indications:           Suspected esophageal reflux, Nausea Providers:             Benay Pike. Alice Reichert MD, MD Referring MD:          Perrin Maltese, MD (Referring MD) Medicines:             Propofol per Anesthesia Complications:         No immediate complications. Procedure:             Pre-Anesthesia Assessment:                        - The risks and benefits of the procedure and the                         sedation options and risks were discussed with the                         patient. All questions were answered and informed                         consent was obtained.                        - Patient identification and proposed procedure were                         verified prior to the procedure by the nurse. The                         procedure was verified in the procedure room.                        - ASA Grade Assessment: III - A patient with severe                         systemic disease.                        - After reviewing the risks and benefits, the patient                         was deemed in satisfactory condition to undergo the                         procedure.                        After obtaining informed consent, the endoscope was                         passed under direct vision. Throughout the procedure,                         the patient's blood  pressure, pulse, and oxygen                         saturations were monitored continuously. The Endoscope                         was introduced through the mouth, and advanced to the                         third part of duodenum. The upper GI endoscopy was                         accomplished without  difficulty. The patient tolerated                         the procedure well. Findings:      The Z-line was irregular and was found 35 to 36 cm from the incisors.       Mucosa was biopsied with a cold forceps for histology. One specimen       bottle was sent to pathology.      A 2 cm hiatal hernia was present.      Localized minimal inflammation characterized by erythema was found in       the gastric antrum. Biopsies were taken with a cold forceps for       Helicobacter pylori testing.      The examined duodenum was normal.      The exam was otherwise without abnormality. Impression:            - Z-line irregular, 35 to 36 cm from the incisors.                         Biopsied.                        - 2 cm hiatal hernia.                        - Gastritis. Biopsied.                        - Normal examined duodenum.                        - The examination was otherwise normal. Recommendation:        - Await pathology results.                        - Proceed with colonoscopy Procedure Code(s):     --- Professional ---                        [phone], Esophagogastroduodenoscopy, flexible,                         transoral; with biopsy, single or multiple Diagnosis Code(s):     --- Professional ---                        R11.0, Nausea                        K29.70, Gastritis, unspecified,  without bleeding                        K44.9, Diaphragmatic hernia without obstruction or                         gangrene                        K22.8, Other specified diseases of esophagus CPT copyright 2019 American Medical Association. All rights reserved. The codes documented in this report are preliminary and upon coder review may  be revised to meet current compliance requirements. Efrain Sella MD, MD 06/24/2019 1:34:04 PM This report has been signed electronically. Number of Addenda: 0 Note Initiated On: 06/24/2019 1:13 PM Estimated Blood Loss:  Estimated blood loss: none.       [facility]

## 2019-06-24 NOTE — Transfer of Care (Signed)
Immediate Anesthesia Transfer of Care Note  Patient: Alexis Christensen  Procedure(s) Performed: ESOPHAGOGASTRODUODENOSCOPY (EGD) WITH PROPOFOL (N/A ) COLONOSCOPY WITH PROPOFOL (N/A )  Patient Location: Endoscopy Unit  Anesthesia Type:General  Level of Consciousness: drowsy and patient cooperative  Airway & Oxygen Therapy: Patient Spontanous Breathing  Post-op Assessment: Report given to RN and Post -op Vital signs reviewed and stable  Post vital signs: Reviewed and stable  Last Vitals:  Vitals Value Taken Time  BP 94/58 06/24/19 1356  Temp 36 C 06/24/19 1356  Pulse    Resp 17 06/24/19 1356  SpO2 98 % 06/24/19 1356    Last Pain:  Vitals:   06/24/19 1356  TempSrc: Temporal         Complications: No apparent anesthesia complications

## 2019-06-24 NOTE — H&P (Signed)
Outpatient short stay form Pre-procedure 06/24/2019 10:40 AM Alexis Christensen K. Alice Reichert, M.D.  Primary Physician: Lamonte Sakai, M.D.  Reason for visit:  Diarrhea (functional), hematochezia, GERD, Chronic nausea  History of present illness: As above. Overall negative lab workup. No weight loss. GERD appears controlled on acid suppression, but nausea persists. Persistent recurrent diarrhea with small amount of hematochezia.  Hx H Pylori positive gastritis as well as C dif colitis.   No current facility-administered medications for this encounter.   Current Outpatient Medications:  .  amitriptyline (ELAVIL) 50 MG tablet, Take 50 mg by mouth at bedtime., Disp: , Rfl:  .  baclofen (LIORESAL) 10 MG tablet, Take 10 mg by mouth 2 (two) times daily., Disp: , Rfl:  .  EPINEPHrine 0.3 mg/0.3 mL IJ SOAJ injection, Inject 0.3 mg into the muscle as needed for anaphylaxis., Disp: , Rfl:  .  metolazone (ZAROXOLYN) 2.5 MG tablet, Take 2.5 mg by mouth 2 (two) times daily., Disp: , Rfl:  .  Olopatadine HCl (PATADAY) 0.2 % SOLN, Apply to eye., Disp: , Rfl:  .  omeprazole (PRILOSEC) 20 MG capsule, Take 20 mg by mouth daily., Disp: , Rfl:  .  potassium chloride SA (KLOR-CON) 20 MEQ tablet, Take 20 mEq by mouth 2 (two) times daily., Disp: , Rfl:  .  rizatriptan (MAXALT) 5 MG tablet, Take 5 mg by mouth as needed for migraine. May repeat in 2 hours if needed, Disp: , Rfl:  .  rosuvastatin (CRESTOR) 40 MG tablet, Take 40 mg by mouth daily., Disp: , Rfl:  .  spironolactone (ALDACTONE) 25 MG tablet, Take 12.5 mg by mouth daily., Disp: , Rfl:  .  ARIPiprazole (ABILIFY MAINTENA IM), Inject 400 mg into the muscle every 30 (thirty) days., Disp: , Rfl:  .  clobetasol cream (TEMOVATE) AB-123456789 %, Apply 1 application topically 2 (two) times daily as needed (for rash). , Disp: , Rfl:  .  colestipol (COLESTID) 1 g tablet, Take 2 g by mouth daily at 12 noon. , Disp: , Rfl:  .  cyclobenzaprine (FLEXERIL) 10 MG tablet, Take 10 mg by mouth 2  (two) times daily as needed for muscle spasms. , Disp: , Rfl: 0 .  diclofenac sodium (VOLTAREN) 1 % GEL, Apply 2 g topically 4 (four) times daily., Disp: 100 g, Rfl: 0 .  DULoxetine (CYMBALTA) 60 MG capsule, Take 60 mg by mouth at bedtime. , Disp: , Rfl:  .  fluticasone (FLONASE) 50 MCG/ACT nasal spray, Place 2 sprays into both nostrils daily. , Disp: , Rfl:  .  furosemide (LASIX) 20 MG tablet, Take 20 mg by mouth 2 (two) times daily., Disp: , Rfl:  .  gabapentin (NEURONTIN) 600 MG tablet, Take 600 mg by mouth 2 (two) times daily. , Disp: , Rfl:  .  hydrOXYzine (ATARAX/VISTARIL) 25 MG tablet, Take 50 mg by mouth every 8 (eight) hours as needed for itching., Disp: , Rfl:  .  levocetirizine (XYZAL) 5 MG tablet, Take 5 mg by mouth every evening., Disp: , Rfl:  .  levothyroxine (SYNTHROID, LEVOTHROID) 150 MCG tablet, Take 150 mcg by mouth daily before breakfast. , Disp: , Rfl:  .  ondansetron (ZOFRAN ODT) 4 MG disintegrating tablet, Take 1 tablet (4 mg total) by mouth every 8 (eight) hours as needed for nausea or vomiting. (Patient not taking: Reported on 05/14/2019), Disp: 20 tablet, Rfl: 0 .  prazosin (MINIPRESS) 2 MG capsule, Take 2 mg by mouth at bedtime., Disp: , Rfl:  .  ranitidine (ZANTAC) 150  MG tablet, Take 150 mg by mouth 2 (two) times daily., Disp: , Rfl:  .  SUBOXONE 8-2 MG FILM, Dissolve 1 film under tongue twice daily, Disp: , Rfl: 0  No medications prior to admission.     Allergies  Allergen Reactions  . Geodon [Ziprasidone Hydrochloride] Other (See Comments)    Numbness ,sob, headaches, blurred vision  . Sulfacetamide Sodium Hives  . Ziprasidone Anaphylaxis    Other reaction(s): Other (See Comments) Numbness ,sob, headaches, blurred vision  Other reaction(s): Other (See Comments), Unknown Numbness ,sob, headaches, blurred vision Numbness ,sob, headaches, blurred vision  Other reaction(s): Other (See Comments), Unknown Numbness ,sob, headaches, blurred vision Numbness  ,sob, headaches, blurred vision   Numbness ,sob, headaches, blurred vision Other reaction(s): Other (See Comments), Unknown Numbness ,sob, headaches, blurred vision Numbness ,sob, headaches, blurred vision Other reaction(s): Other (See Comments), Unknown Numbness ,sob, headaches, blurred vision Numbness ,sob, headaches, blurred vision  . Ziprasidone Hcl Anaphylaxis and Other (See Comments)    Other reaction(s): Other (See Comments), Unknown Numbness ,sob, headaches, blurred vision Numbness ,sob, headaches, blurred vision  . Ace Inhibitors Rash  . Erythromycin Rash and Hives  . Hydromorphone Rash  . Lamictal [Lamotrigine] Rash    Other reaction(s): Unknown  . Strawberry (Diagnostic) Rash  . Sulfa Antibiotics Hives and Rash    Other reaction(s): Unknown Other reaction(s): Unknown Other reaction(s): Unknown     Past Medical History:  Diagnosis Date  . Adopted   . Anemia   . Anxiety   . Arthritis   . Asthma   . Bipolar 1 disorder ([place])   . Bipolar disorder ([place])   . Carpal tunnel syndrome 09/15/2014  . CHF (congestive heart failure) (HCC)    Dialostic CHF  . Chronic diarrhea 09/07/2015  . Chronic kidney disease    H/O KIDNEY STONES  . Chronic venous insufficiency 04/26/2016  . Depression   . DVT (deep venous thrombosis) ([place]) 2016   RIGHT LEG  . Edema leg   . Effusion of knee 12/30/2013  . Family history of adverse reaction to anesthesia    ADOPTED  . GERD (gastroesophageal reflux disease)   . Goiter   . 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 Headache(784.0)    MIGRAINES  . Heart burn   . Heart murmur   . History of kidney stones   . HLD (hyperlipidemia)   . Hx MRSA infection   . Hypothyroidism   . Lipoma of arm 05/11/2013  . Lower extremity edema   . Lymphedema 04/26/2016  . Post traumatic stress disorder (PTSD)   . PVD (peripheral vascular disease) ([place])   . Seasonal allergies   . Stress incontinence   . Thyroid disease   . Urge incontinence      Review of systems:  Otherwise negative.    Physical Exam  Gen: Alert, oriented. Appears stated age.  HEENT: [place]/AT. PERRLA. Lungs: CTA, no wheezes. CV: RR nl S1, S2. Abd: soft, benign, no masses. BS+ Ext: No edema. Pulses 2+    Planned procedures: Proceed with EGD and  colonoscopy. The patient understands the nature of the planned procedure, indications, risks, alternatives and potential complications including but not limited to bleeding, infection, perforation, damage to internal organs and possible oversedation/side effects from anesthesia. The patient agrees and gives consent to proceed.  Please refer to procedure notes for findings, recommendations and patient disposition/instructions.     Neha Waight K. Alice Reichert, M.D. Gastroenterology 06/24/2019  10:40 AM

## 2019-06-24 NOTE — Op Note (Signed)
[facility] Gastroenterology Patient Name: Alexis Christensen Procedure Date: 06/24/2019 1:12 PM MRN: [id number] Account #: [id number] Date of Birth: April 22, 1971 Admit Type: Outpatient Age: 48 Room: [facility] ENDO ROOM 3 Gender: Female Note Status: Finalized Procedure:             Colonoscopy Indications:           Functional diarrhea, Hematochezia Providers:             Benay Pike. Alice Reichert MD, MD Referring MD:          Perrin Maltese, MD (Referring MD) Medicines:             Propofol per Anesthesia Complications:         No immediate complications. Procedure:             Pre-Anesthesia Assessment:                        - The risks and benefits of the procedure and the                         sedation options and risks were discussed with the                         patient. All questions were answered and informed                         consent was obtained.                        - Patient identification and proposed procedure were                         verified prior to the procedure by the nurse. The                         procedure was verified in the procedure room.                        - ASA Grade Assessment: III - A patient with severe                         systemic disease.                        - After reviewing the risks and benefits, the patient                         was deemed in satisfactory condition to undergo the                         procedure.                        After obtaining informed consent, the colonoscope was                         passed under direct vision. Throughout the procedure,                         the patient's blood pressure, pulse, and  oxygen                         saturations were monitored continuously. The                         Colonoscope was introduced through the anus and                         advanced to the the terminal ileum, with                         identification of the appendiceal orifice and IC                          valve. The colonoscopy was performed without                         difficulty. The patient tolerated the procedure well.                         The quality of the bowel preparation was good. The                         terminal ileum, ileocecal valve, appendiceal orifice,                         and rectum were photographed. Findings:      The perianal and digital rectal examinations were normal. Pertinent       negatives include normal sphincter tone and no palpable rectal lesions.      Many small and large-mouthed diverticula were found in the sigmoid colon.      A 5 mm polyp was found in the ascending colon. The polyp was sessile.       The polyp was removed with a jumbo cold forceps. Resection and retrieval       were complete.      The terminal ileum appeared normal. Biopsies were taken with a cold       forceps for histology.      Normal mucosa was found in the entire colon. Biopsies for histology were       taken with a cold forceps from the random colon for evaluation of       microscopic colitis.      Non-bleeding internal hemorrhoids were found during retroflexion. The       hemorrhoids were Grade I (internal hemorrhoids that do not prolapse).      The exam was otherwise without abnormality. Impression:            - Diverticulosis in the sigmoid colon.                        - One 5 mm polyp in the ascending colon, removed with                         a jumbo cold forceps. Resected and retrieved.                        - The examined portion of the ileum was normal.  Biopsied.                        - Normal mucosa in the entire examined colon. Biopsied.                        - Non-bleeding internal hemorrhoids.                        - The examination was otherwise normal. Recommendation:        - Patient has a contact number available for                         emergencies. The signs and symptoms of potential                          delayed complications were discussed with the patient.                         Return to normal activities tomorrow. Written                         discharge instructions were provided to the patient.                        - Resume previous diet.                        - Continue present medications.                        - Await pathology results.                        - Repeat colonoscopy is recommended for surveillance.                         The colonoscopy date will be determined after                         pathology results from today's exam become available                         for review.                        - Await pathology results from EGD, also performed                         today.                        - Return to physician assistant in 6 weeks.                        - The findings and recommendations were discussed with                         the patient. Procedure Code(s):     --- Professional ---  45380, Colonoscopy, flexible; with biopsy, single or                         multiple Diagnosis Code(s):     --- Professional ---                        K64.0, First degree hemorrhoids                        K63.5, Polyp of colon                        K59.1, Functional diarrhea                        K92.1, Melena (includes Hematochezia)                        K57.30, Diverticulosis of large intestine without                         perforation or abscess without bleeding CPT copyright 2019 [employer]. All rights reserved. The codes documented in this report are preliminary and upon coder review may  be revised to meet current compliance requirements. Efrain Sella MD, MD 06/24/2019 1:54:49 PM This report has been signed electronically. Number of Addenda: 0 Note Initiated On: 06/24/2019 1:12 PM Scope Withdrawal Time: 0 hours 7 minutes 10 seconds  Total Procedure Duration: 0 hours 13 minutes 12 seconds   Estimated Blood Loss:  Estimated blood loss: none.      [facility]

## 2019-06-24 NOTE — Anesthesia Preprocedure Evaluation (Signed)
Anesthesia Evaluation  Patient identified by MRN, date of birth, ID band Patient awake    Reviewed: Allergy & Precautions, H&P , NPO status , Patient's Chart, lab work & pertinent test results, reviewed documented beta blocker date and time   Airway Mallampati: II   Neck ROM: full    Dental  (+) Teeth Intact   Pulmonary asthma , Patient abstained from smoking., former smoker,    Pulmonary exam normal        Cardiovascular Exercise Tolerance: Good + Peripheral Vascular Disease and +CHF  (-) Orthopnea, (-) PND and (-) DOE Normal cardiovascular exam+ Valvular Problems/Murmurs  Rhythm:regular Rate:Normal     Neuro/Psych  Headaches,  Neuromuscular disease negative psych ROS   GI/Hepatic Neg liver ROS, GERD  Medicated,  Endo/Other  Hypothyroidism   Renal/GU Renal disease  negative genitourinary   Musculoskeletal   Abdominal   Peds  Hematology  (+) Blood dyscrasia, anemia ,   Anesthesia Other Findings Past Medical History: No date: Adopted No date: Anemia No date: Anxiety No date: Arthritis No date: Asthma No date: Bipolar 1 disorder (HCC) No date: Bipolar disorder ([place]) 09/15/2014: Carpal tunnel syndrome No date: CHF (congestive heart failure) (HCC)     Comment:  Dialostic CHF 09/07/2015: Chronic diarrhea No date: Chronic kidney disease     Comment:  H/O KIDNEY STONES 04/26/2016: Chronic venous insufficiency No date: Depression 2016: DVT (deep venous thrombosis) ([place])     Comment:  RIGHT LEG No date: Edema leg 12/30/2013: Effusion of knee No date: Family history of adverse reaction to anesthesia     Comment:  ADOPTED No date: GERD (gastroesophageal reflux disease) No date: Goiter 12/30/2013: H/O total knee replacement No date: Headache(784.0)     Comment:  MIGRAINES No date: Heart burn No date: Heart murmur No date: History of kidney stones No date: HLD (hyperlipidemia) No date: Hx MRSA infection No date:  Hypothyroidism 05/11/2013: Lipoma of arm No date: Lower extremity edema 04/26/2016: Lymphedema No date: Post traumatic stress disorder (PTSD) No date: PVD (peripheral vascular disease) (HCC) No date: Seasonal allergies No date: Stress incontinence No date: Thyroid disease No date: Urge incontinence Past Surgical History: No date: ABDOMINAL HYSTERECTOMY No date: ANKLE SURGERY No date: CHOLECYSTECTOMY 04/09/2016: CYSTOSCOPY; N/A     Comment:  Procedure: CYSTOSCOPY;  Surgeon: Bjorn Loser, MD;                Location: [facility] ORS;  Service: Urology;  Laterality: N/A; No date: DILATION AND CURETTAGE OF UTERUS 04/18/2017: INGUINAL HERNIA REPAIR; Bilateral     Comment:  Procedure: LAPAROSCOPIC BILATERAL INGUINAL HERNIA               REPAIR;  Surgeon: Jules Husbands, MD;  Location: [facility]               ORS;  Service: General;  Laterality: Bilateral; 11/26/2016: Barrie Lyme IMPLANT PLACEMENT; N/A     Comment:  Procedure: Barrie Lyme IMPLANT FIRST STAGE;  Surgeon:               Bjorn Loser, MD;  Location: [facility] ORS;  Service:               Urology;  Laterality: N/A; 11/26/2016: Barrie Lyme IMPLANT PLACEMENT; N/A     Comment:  Procedure: INTERSTIM IMPLANT SECOND STAGE;  Surgeon:               Bjorn Loser, MD;  Location: [facility] ORS;  Service:  Urology;  Laterality: N/A; No date: JOINT REPLACEMENT; Right     Comment:  knee No date: KNEE ARTHROSCOPY; Bilateral 10/18/2015: KNEE ARTHROSCOPY WITH LATERAL RELEASE; Left     Comment:  Procedure: KNEE ARTHROSCOPY LATERAL AND PARTIAL               SYNOVECTOMY;  Surgeon: Hessie Knows, MD;  Location: [facility]               ORS;  Service: Orthopedics;  Laterality: Left; 2015: Lymph Node removal     Comment:  Neck 04/09/2016: PUBOVAGINAL SLING; N/A     Comment:  Procedure: PUBO-VAGINAL SLING/ RETROPUBIC SLING;                Surgeon: Bjorn Loser, MD;  Location: [facility] ORS;                Service: Urology;  Laterality: N/A; No date:  REPLACEMENT TOTAL KNEE; Right 2014: SHOULDER SURGERY; Right No date: TUBAL LIGATION No date: WRIST SURGERY; Right     Comment:  metal plate   Reproductive/Obstetrics negative OB ROS                             Anesthesia Physical Anesthesia Plan  ASA: III  Anesthesia Plan: General   Post-op Pain Management:    Induction:   PONV Risk Score and Plan:   Airway Management Planned:   Additional Equipment:   Intra-op Plan:   Post-operative Plan:   Informed Consent: I have reviewed the patients History and Physical, chart, labs and discussed the procedure including the risks, benefits and alternatives for the proposed anesthesia with the patient or authorized representative who has indicated his/her understanding and acceptance.     Dental Advisory Given  Plan Discussed with: CRNA  Anesthesia Plan Comments:         Anesthesia Quick Evaluation

## 2019-06-24 NOTE — Anesthesia Post-op Follow-up Note (Signed)
Anesthesia QCDR form completed.        

## 2019-06-24 NOTE — Interval H&P Note (Signed)
History and Physical Interval Note:  06/24/2019 12:27 PM  Alexis Christensen  has presented today for surgery, with the diagnosis of [place].  The various methods of treatment have been discussed with the patient and family. After consideration of risks, benefits and other options for treatment, the patient has consented to  Procedure(s): ESOPHAGOGASTRODUODENOSCOPY (EGD) WITH PROPOFOL (N/A) COLONOSCOPY WITH PROPOFOL (N/A) as a surgical intervention.  The patient's history has been reviewed, patient examined, no change in status, stable for surgery.  I have reviewed the patient's chart and labs.  Questions were answered to the patient's satisfaction.     [place], [place]

## 2019-06-25 ENCOUNTER — Encounter: Payer: Self-pay | Admitting: *Deleted

## 2019-06-25 LAB — SURGICAL PATHOLOGY

## 2019-07-02 NOTE — Anesthesia Postprocedure Evaluation (Signed)
Anesthesia Post Note  Patient: Alexis Christensen  Procedure(s) Performed: ESOPHAGOGASTRODUODENOSCOPY (EGD) WITH PROPOFOL (N/A ) COLONOSCOPY WITH PROPOFOL (N/A )  Patient location during evaluation: PACU Anesthesia Type: General Level of consciousness: awake and alert Pain management: pain level controlled Vital Signs Assessment: post-procedure vital signs reviewed and stable Respiratory status: spontaneous breathing, nonlabored ventilation, respiratory function stable and patient connected to nasal cannula oxygen Cardiovascular status: blood pressure returned to baseline and stable Postop Assessment: no apparent nausea or vomiting Anesthetic complications: no     Last Vitals:  Vitals:   06/24/19 1406 06/24/19 1416  BP: 119/76 114/77  Pulse: 73 63  Resp: 16 19  Temp:    SpO2: 100%     Last Pain:  Vitals:   06/25/19 0752  TempSrc:   PainSc: 0-No pain                 Molli Barrows

## 2019-08-28 ENCOUNTER — Other Ambulatory Visit: Payer: Self-pay | Admitting: Nurse Practitioner

## 2019-08-28 DIAGNOSIS — Z1231 Encounter for screening mammogram for malignant neoplasm of breast: Secondary | ICD-10-CM

## 2019-10-06 ENCOUNTER — Ambulatory Visit
Admission: RE | Admit: 2019-10-06 | Discharge: 2019-10-06 | Disposition: A | Discharge status: Home / Self Care | Payer: Medicare Other | Source: Ambulatory Visit | Attending: Nurse Practitioner | Admitting: Nurse Practitioner

## 2019-10-06 DIAGNOSIS — Z1231 Encounter for screening mammogram for malignant neoplasm of breast: Secondary | ICD-10-CM | POA: Insufficient documentation

## 2020-02-16 ENCOUNTER — Encounter: Payer: Self-pay | Admitting: *Deleted

## 2020-02-16 ENCOUNTER — Other Ambulatory Visit: Payer: Self-pay

## 2020-02-16 DIAGNOSIS — E039 Hypothyroidism, unspecified: Secondary | ICD-10-CM | POA: Insufficient documentation

## 2020-02-16 DIAGNOSIS — R1032 Left lower quadrant pain: Secondary | ICD-10-CM | POA: Insufficient documentation

## 2020-02-16 DIAGNOSIS — K219 Gastro-esophageal reflux disease without esophagitis: Secondary | ICD-10-CM | POA: Diagnosis not present

## 2020-02-16 DIAGNOSIS — N189 Chronic kidney disease, unspecified: Secondary | ICD-10-CM | POA: Insufficient documentation

## 2020-02-16 DIAGNOSIS — M79605 Pain in left leg: Secondary | ICD-10-CM | POA: Diagnosis not present

## 2020-02-16 DIAGNOSIS — Z87891 Personal history of nicotine dependence: Secondary | ICD-10-CM | POA: Insufficient documentation

## 2020-02-16 DIAGNOSIS — Z96651 Presence of right artificial knee joint: Secondary | ICD-10-CM | POA: Diagnosis not present

## 2020-02-16 DIAGNOSIS — I5030 Unspecified diastolic (congestive) heart failure: Secondary | ICD-10-CM | POA: Insufficient documentation

## 2020-02-16 DIAGNOSIS — J45909 Unspecified asthma, uncomplicated: Secondary | ICD-10-CM | POA: Diagnosis not present

## 2020-02-16 NOTE — ED Triage Notes (Signed)
LLQ abd pain shooting into left leg for the past couple days. Pt denies NVD, SOB, Dizziness lightheadedness or fevers at home. No known changes in urine. No tenderness upon palpation of abd.

## 2020-02-17 ENCOUNTER — Emergency Department
Admission: EM | Admit: 2020-02-17 | Discharge: 2020-02-17 | Disposition: A | Discharge status: Home / Self Care | Payer: Medicare Other | Attending: Student in an Organized Health Care Education/Training Program | Admitting: Student in an Organized Health Care Education/Training Program

## 2020-02-17 ENCOUNTER — Emergency Department: Payer: Medicare Other

## 2020-02-17 DIAGNOSIS — R1032 Left lower quadrant pain: Secondary | ICD-10-CM

## 2020-02-17 LAB — URINALYSIS, COMPLETE (UACMP) WITH MICROSCOPIC
Bilirubin Urine: NEGATIVE
Glucose, UA: NEGATIVE mg/dL
Hgb urine dipstick: NEGATIVE
Ketones, ur: NEGATIVE mg/dL
Nitrite: NEGATIVE
Protein, ur: NEGATIVE mg/dL
Specific Gravity, Urine: 1.029 (ref 1.005–1.030)
WBC, UA: >50 WBC/hpf — ABNORMAL HIGH (ref 0–5)
pH: 5 (ref 5.0–8.0)

## 2020-02-17 LAB — COMPREHENSIVE METABOLIC PANEL
ALT: 19 U/L (ref 0–44)
AST: 19 U/L (ref 15–41)
Albumin: 3.9 g/dL (ref 3.5–5.0)
Alkaline Phosphatase: 71 U/L (ref 38–126)
Anion gap: 9 (ref 5–15)
BUN: 12 mg/dL (ref 6–20)
CO2: 28 mmol/L (ref 22–32)
Calcium: 8.5 mg/dL — ABNORMAL LOW (ref 8.9–10.3)
Chloride: 104 mmol/L (ref 98–111)
Creatinine, Ser: 0.61 mg/dL (ref 0.44–1.00)
GFR calc Af Amer: >60 mL/min (ref >60)
GFR calc non Af Amer: >60 mL/min (ref >60)
Glucose, Bld: 98 mg/dL (ref 70–99)
Potassium: 3.7 mmol/L (ref 3.5–5.1)
Sodium: 141 mmol/L (ref 135–145)
Total Bilirubin: 0.5 mg/dL (ref 0.3–1.2)
Total Protein: 6.9 g/dL (ref 6.5–8.1)

## 2020-02-17 LAB — CBC
HCT: 38.1 % (ref 36.0–46.0)
Hemoglobin: 12.3 g/dL (ref 12.0–15.0)
MCH: 29.8 pg (ref 26.0–34.0)
MCHC: 32.3 g/dL (ref 30.0–36.0)
MCV: 92.3 fL (ref 80.0–100.0)
Platelets: 250 10*3/uL (ref 150–400)
RBC: 4.13 MIL/uL (ref 3.87–5.11)
RDW: 11.9 % (ref 11.5–15.5)
WBC: 7.6 10*3/uL (ref 4.0–10.5)
nRBC: 0 % (ref 0.0–0.2)

## 2020-02-17 LAB — LIPASE, BLOOD: Lipase: 24 U/L (ref 11–51)

## 2020-02-17 MED ORDER — MORPHINE SULFATE (PF) 4 MG/ML IV SOLN
4.0000 mg | INTRAVENOUS | Status: DC | PRN
  Administered 2020-02-17: 4 mg via INTRAVENOUS
  Filled 2020-02-17: qty 1

## 2020-02-17 MED ORDER — ONDANSETRON HCL 4 MG/2ML IJ SOLN
4.0000 mg | Freq: Once | INTRAMUSCULAR | Status: AC
  Administered 2020-02-17: 4 mg via INTRAVENOUS
  Filled 2020-02-17: qty 2

## 2020-02-17 MED ORDER — IOHEXOL 300 MG/ML  SOLN
100.0000 mL | Freq: Once | INTRAMUSCULAR | Status: AC | PRN
  Administered 2020-02-17: 100 mL via INTRAVENOUS

## 2020-02-17 MED ORDER — SODIUM CHLORIDE 0.9 % IV BOLUS
500.0000 mL | Freq: Once | INTRAVENOUS | Status: AC
  Administered 2020-02-17: 500 mL via INTRAVENOUS

## 2020-02-17 NOTE — Discharge Instructions (Signed)

## 2020-02-17 NOTE — ED Provider Notes (Signed)
[facility] Emergency Department Provider Note    First MD Initiated Contact with Patient 02/17/20 [phone]     (approximate)  I have reviewed the triage vital signs and the nursing notes.   HISTORY  Chief Complaint Abdominal Pain and Leg Pain    HPI Alexis Christensen is a 49 y.o. female the below listed past medical history presents to the ER for 24 hours of severe left lower quadrant abdominal pain it is right PDA and to the left leg associated with some bloating.  Is never had pain like this before.  Does have history of diverticulitis as well as C. difficile colitis nephrolithiasis.  Denies any bleeding or discharge.  No dysuria.  No flank pain.  Currently rates the pain is mild to moderate.    Past Medical History:  Diagnosis Date  . Adopted   . Anemia   . Anxiety   . Arthritis   . Asthma   . Bipolar 1 disorder ([place])   . Bipolar disorder ([place])   . Carpal tunnel syndrome 09/15/2014  . CHF (congestive heart failure) (HCC)    Dialostic CHF  . Chronic diarrhea 09/07/2015  . Chronic kidney disease    H/O KIDNEY STONES  . Chronic venous insufficiency 04/26/2016  . Depression   . DVT (deep venous thrombosis) ([place]) 2016   RIGHT LEG  . Edema leg   . Effusion of knee 12/30/2013  . Family history of adverse reaction to anesthesia    ADOPTED  . GERD (gastroesophageal reflux disease)   . Goiter   . 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 Headache(784.0)    MIGRAINES  . Heart burn   . Heart murmur   . History of kidney stones   . HLD (hyperlipidemia)   . Hx MRSA infection   . Hypothyroidism   . Lipoma of arm 05/11/2013  . Lower extremity edema   . Lymphedema 04/26/2016  . Post traumatic stress disorder (PTSD)   . PVD (peripheral vascular disease) ([place])   . Seasonal allergies   . Stress incontinence   . Thyroid disease   . Urge incontinence    Family History  Adopted: Yes  Family history unknown: Yes   Past Surgical History:  Procedure  Laterality Date  . ABDOMINAL HYSTERECTOMY    . ANKLE SURGERY    . CHOLECYSTECTOMY    . COLONOSCOPY WITH PROPOFOL N/A 06/24/2019   Procedure: COLONOSCOPY WITH PROPOFOL;  Surgeon: Toledo, Benay Pike, MD;  Location: [facility] ENDOSCOPY;  Service: Gastroenterology;  Laterality: N/A;  . CYSTOSCOPY N/A 04/09/2016   Procedure: CYSTOSCOPY;  Surgeon: Bjorn Loser, MD;  Location: [facility] ORS;  Service: Urology;  Laterality: N/A;  . DILATION AND CURETTAGE OF UTERUS    . ESOPHAGOGASTRODUODENOSCOPY (EGD) WITH PROPOFOL N/A 06/24/2019   Procedure: ESOPHAGOGASTRODUODENOSCOPY (EGD) WITH PROPOFOL;  Surgeon: Toledo, Benay Pike, MD;  Location: [facility] ENDOSCOPY;  Service: Gastroenterology;  Laterality: N/A;  . INGUINAL HERNIA REPAIR Bilateral 04/18/2017   Procedure: LAPAROSCOPIC BILATERAL INGUINAL HERNIA REPAIR;  Surgeon: Jules Husbands, MD;  Location: [facility] ORS;  Service: General;  Laterality: Bilateral;  . INTERSTIM IMPLANT PLACEMENT N/A 11/26/2016   Procedure: Barrie Lyme IMPLANT FIRST STAGE;  Surgeon: Bjorn Loser, MD;  Location: [facility] ORS;  Service: Urology;  Laterality: N/A;  . INTERSTIM IMPLANT PLACEMENT N/A 11/26/2016   Procedure: Barrie Lyme IMPLANT SECOND STAGE;  Surgeon: Bjorn Loser, MD;  Location: [facility] ORS;  Service: Urology;  Laterality: N/A;  . JOINT REPLACEMENT Right    knee  . KNEE  ARTHROSCOPY Bilateral   . KNEE ARTHROSCOPY WITH LATERAL RELEASE Left 10/18/2015   Procedure: KNEE ARTHROSCOPY LATERAL AND PARTIAL SYNOVECTOMY;  Surgeon: Hessie Knows, MD;  Location: [facility] ORS;  Service: Orthopedics;  Laterality: Left;  . Lymph Node removal  2015   Neck  . PUBOVAGINAL SLING N/A 04/09/2016   Procedure: PUBO-VAGINAL SLING/ RETROPUBIC SLING;  Surgeon: Bjorn Loser, MD;  Location: [facility] ORS;  Service: Urology;  Laterality: N/A;  . REPLACEMENT TOTAL KNEE Right   . SHOULDER SURGERY Right 2014  . TUBAL LIGATION    . WRIST SURGERY Right    metal plate   Patient Active Problem List   Diagnosis Date Noted  .  Bilateral inguinal hernia without obstruction or gangrene   . Redness of eye, left 08/22/2016  . Iron deficiency anemia 07/11/2016  . ANA positive 06/13/2016  . Bilateral hand pain 06/13/2016  . Lymphedema 04/26/2016  . Chronic venous insufficiency 04/26/2016  . Swelling of limb 04/26/2016  . Chronic diarrhea 09/07/2015  . Fever 09/07/2015  . Hypothyroidism 09/07/2015  . Hypovitaminosis D 09/07/2015  . Obesity 09/07/2015  . Weight loss, unintentional 09/07/2015  . Bipolar mixed affective disorder, moderate ([place]) 09/05/2015  . SUI (stress urinary incontinence, female) 05/18/2015  . Pain in shoulder 09/15/2014  . Carpal tunnel syndrome 09/15/2014  . Chronic left shoulder pain 09/15/2014  . Effusion of knee 12/30/2013  . 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 Gonalgia 12/30/2013  . Lipoma of arm 05/11/2013      Prior to Admission medications   Medication Sig Start Date End Date Taking? Authorizing Provider  amitriptyline (ELAVIL) 50 MG tablet Take 50 mg by mouth at bedtime.    [provider]  ARIPiprazole (ABILIFY MAINTENA IM) Inject 400 mg into the muscle every 30 (thirty) days.    [provider]  baclofen (LIORESAL) 10 MG tablet Take 10 mg by mouth 2 (two) times daily.    [provider]  clobetasol cream (TEMOVATE) 0.26 % Apply 1 application topically 2 (two) times daily as needed (for rash).     [provider]  colestipol (COLESTID) 1 g tablet Take 2 g by mouth daily at 12 noon.     [provider]  cyclobenzaprine (FLEXERIL) 10 MG tablet Take 10 mg by mouth 2 (two) times daily as needed for muscle spasms.  03/29/17   [provider]  diclofenac sodium (VOLTAREN) 1 % GEL Apply 2 g topically 4 (four) times daily. 12/23/17   Laban Emperor, PA-C  DULoxetine (CYMBALTA) 60 MG capsule Take 60 mg by mouth at bedtime.     [provider]  EPINEPHrine 0.3 mg/0.3 mL IJ SOAJ injection Inject 0.3 mg into the muscle as needed  for anaphylaxis.    [provider]  fluticasone (FLONASE) 50 MCG/ACT nasal spray Place 2 sprays into both nostrils daily.     [provider]  furosemide (LASIX) 20 MG tablet Take 20 mg by mouth 2 (two) times daily. 02/19/19   [provider]  gabapentin (NEURONTIN) 600 MG tablet Take 600 mg by mouth 2 (two) times daily.     [provider]  hydrOXYzine (ATARAX/VISTARIL) 25 MG tablet Take 50 mg by mouth every 8 (eight) hours as needed for itching.    [provider]  levocetirizine (XYZAL) 5 MG tablet Take 5 mg by mouth every evening.    [provider]  levothyroxine (SYNTHROID, LEVOTHROID) 150 MCG tablet Take 150 mcg by mouth daily before breakfast.  02/24/16  [provider]  metolazone (ZAROXOLYN) 2.5 MG tablet Take 2.5 mg by mouth 2 (two) times daily.    [provider]  Olopatadine HCl (PATADAY) 0.2 % SOLN Apply to eye.    [provider]  omeprazole (PRILOSEC) 20 MG capsule Take 20 mg by mouth daily.    [provider]  ondansetron (ZOFRAN ODT) 4 MG disintegrating tablet Take 1 tablet (4 mg total) by mouth every 8 (eight) hours as needed for nausea or vomiting. Patient not taking: Reported on 05/14/2019 01/25/18   Paulette Blanch, MD  potassium chloride SA (KLOR-CON) 20 MEQ tablet Take 20 mEq by mouth 2 (two) times daily.    [provider]  prazosin (MINIPRESS) 2 MG capsule Take 2 mg by mouth at bedtime.    [provider]  ranitidine (ZANTAC) 150 MG tablet Take 150 mg by mouth 2 (two) times daily.    [provider]  rizatriptan (MAXALT) 5 MG tablet Take 5 mg by mouth as needed for migraine. May repeat in 2 hours if needed    [provider]  rosuvastatin (CRESTOR) 40 MG tablet Take 40 mg by mouth daily.    [provider]  spironolactone (ALDACTONE) 25 MG tablet Take 12.5 mg by mouth daily.    [provider]  SUBOXONE 8-2 MG FILM Dissolve 1 film under  tongue twice daily 09/14/16   [provider]    Allergies Geodon [ziprasidone hydrochloride], Sulfacetamide sodium, Ziprasidone, Ziprasidone hcl, Ace inhibitors, Erythromycin, Hydromorphone, Lamictal [lamotrigine], Strawberry (diagnostic), and Sulfa antibiotics    Social History Social History   Tobacco Use  . Smoking status: Former Smoker    Packs/day: 0.25    Years: 20.00    Pack years: 5.00    Types: Cigarettes    Quit date: 11/13/2012    Years since quitting: 7.2  . Smokeless tobacco: Former Systems developer  . Tobacco comment: STRESS RELATED  Vaping Use  . Vaping Use: Never used  Substance Use Topics  . Alcohol use: Not Currently    Alcohol/week: 0.0 standard drinks    Comment: occasional  . Drug use: No    Review of Systems Patient denies headaches, rhinorrhea, blurry vision, numbness, shortness of breath, chest pain, edema, cough, abdominal pain, nausea, vomiting, diarrhea, dysuria, fevers, rashes or hallucinations unless otherwise stated above in HPI. ____________________________________________   PHYSICAL EXAM:  VITAL SIGNS: Vitals:   02/17/20 0743 02/17/20 0808  BP: 113/73 110/65  Pulse: (!) 50 (!) 55  Resp: 16 16  Temp:    SpO2: 99% 98%    Constitutional: Alert and oriented.  Eyes: Conjunctivae are normal.  Head: Atraumatic. Nose: No congestion/rhinnorhea. Mouth/Throat: Mucous membranes are moist.   Neck: No stridor. Painless ROM.  Cardiovascular: Normal rate, regular rhythm. Grossly normal heart sounds.  Good peripheral circulation. Respiratory: Normal respiratory effort.  No retractions. Lungs CTAB. Gastrointestinal: Soft and nontender. Mild ttp in llq. No distention. No abdominal bruits. No CVA tenderness. Genitourinary:  Musculoskeletal: No lower extremity tenderness nor edema.  No joint effusions. Neurologic:  Normal speech and language. No gross focal neurologic deficits are appreciated. No facial droop Skin:  Skin is warm, dry and intact. No rash  noted. Psychiatric: Mood and affect are normal. Speech and behavior are normal.  ____________________________________________   LABS (all labs ordered are listed, but only abnormal results are displayed)  Results for orders placed or performed during the hospital encounter of 02/17/20 (from the past 24 hour(s))  Lipase, blood  Status: None   Collection Time: 02/17/20 12:00 AM  Result Value Ref Range   Lipase 24 11 - 51 U/L  Comprehensive metabolic panel     Status: Abnormal   Collection Time: 02/17/20 12:00 AM  Result Value Ref Range   Sodium 141 135 - 145 mmol/L   Potassium 3.7 3.5 - 5.1 mmol/L   Chloride 104 98 - 111 mmol/L   CO2 28 22 - 32 mmol/L   Glucose, Bld 98 70 - 99 mg/dL   BUN 12 6 - 20 mg/dL   Creatinine, Ser 0.61 0.44 - 1.00 mg/dL   Calcium 8.5 (L) 8.9 - 10.3 mg/dL   Total Protein 6.9 6.5 - 8.1 g/dL   Albumin 3.9 3.5 - 5.0 g/dL   AST 19 15 - 41 U/L   ALT 19 0 - 44 U/L   Alkaline Phosphatase 71 38 - 126 U/L   Total Bilirubin 0.5 0.3 - 1.2 mg/dL   GFR calc non Af Amer >60 >60 mL/min   GFR calc Af Amer >60 >60 mL/min   Anion gap 9 5 - 15  CBC     Status: None   Collection Time: 02/17/20 12:00 AM  Result Value Ref Range   WBC 7.6 4.0 - 10.5 K/uL   RBC 4.13 3.87 - 5.11 MIL/uL   Hemoglobin 12.3 12.0 - 15.0 g/dL   HCT 38.1 36 - 46 %   MCV 92.3 80.0 - 100.0 fL   MCH 29.8 26.0 - 34.0 pg   MCHC 32.3 30.0 - 36.0 g/dL   RDW 11.9 11.5 - 15.5 %   Platelets 250 150 - 400 K/uL   nRBC 0.0 0.0 - 0.2 %  Urinalysis, Complete w Microscopic     Status: Abnormal   Collection Time: 02/17/20 12:00 AM  Result Value Ref Range   Color, Urine YELLOW (A) YELLOW   APPearance CLOUDY (A) CLEAR   Specific Gravity, Urine 1.029 1.005 - 1.030   pH 5.0 5.0 - 8.0   Glucose, UA NEGATIVE NEGATIVE mg/dL   Hgb urine dipstick NEGATIVE NEGATIVE   Bilirubin Urine NEGATIVE NEGATIVE   Ketones, ur NEGATIVE NEGATIVE mg/dL   Protein, ur NEGATIVE NEGATIVE mg/dL   Nitrite NEGATIVE NEGATIVE    Leukocytes,Ua MODERATE (A) NEGATIVE   RBC / HPF 0-5 0 - 5 RBC/hpf   WBC, UA >50 (H) 0 - 5 WBC/hpf   Bacteria, UA RARE (A) NONE SEEN   Squamous Epithelial / LPF 11-20 0 - 5   Mucus PRESENT    ____________________________________________  EKG________________________________  RADIOLOGY  I personally reviewed all radiographic images ordered to evaluate for the above acute complaints and reviewed radiology reports and findings.  These findings were personally discussed with the patient.  Please see medical record for radiology report.  ____________________________________________   PROCEDURES  Procedure(s) performed:  Procedures    Critical Care performed: no ____________________________________________   INITIAL IMPRESSION / ASSESSMENT AND PLAN / ED COURSE  Pertinent labs & imaging results that were available during my care of the patient were reviewed by me and considered in my medical decision making (see chart for details).   DDX: diverticulitis, msk strain, hernia, uti, pyelo, stone, mass, ovarian cyst, torsion,   Alexis Christensen is a 49 y.o. who presents to the ED with chief complaint of left-sided abdominal pain as described above.  She is nontoxic-appearing was able to ambulate into the room.  Does not have any focal peritonitis.  Blood work is reassuring but does have a history  of diverticulitis and after discussion patient would like to proceed with CT imaging to exclude acute intra-abdominal process.  Clinical Course as of Feb 16 1158  Wed Feb 17, 2020  0913 Patient's work-up is grossly reassuring no sign of acute intra-abdominal process.  She is adamantly denying any symptoms of dysuria hematuria or frequency.  I do suspect the urinalysis likely contaminant.  Do not feel that antibiotics are clinically indicated at this time.  I do believe she stable and appropriate for outpatient follow-up.   [PR]    Clinical Course User Index [PR] Merlyn Lot, MD    The  patient was evaluated in Emergency Department today for the symptoms described in the history of present illness. He/she was evaluated in the context of the global COVID-19 pandemic, which necessitated consideration that the patient might be at risk for infection with the SARS-CoV-2 virus that causes COVID-19. Institutional protocols and algorithms that pertain to the evaluation of patients at risk for COVID-19 are in a state of rapid change based on information released by regulatory bodies including the CDC and federal and state organizations. These policies and algorithms were followed during the patient's care in the ED.  As part of my medical decision making, I reviewed the following data within the [place] notes reviewed and incorporated, Labs reviewed, notes from prior ED visits and [place] Controlled Substance Database   ____________________________________________   FINAL CLINICAL IMPRESSION(S) / ED DIAGNOSES  Final diagnoses:  Left lower quadrant abdominal pain      NEW MEDICATIONS STARTED DURING THIS VISIT:  Discharge Medication List as of 02/17/2020  9:14 AM       Note:  This document was prepared using Dragon voice recognition software and may include unintentional dictation errors.    Merlyn Lot, MD 02/17/20 1159

## 2020-06-11 ENCOUNTER — Emergency Department: Payer: Medicare Other

## 2020-06-11 ENCOUNTER — Encounter: Payer: Self-pay | Admitting: Emergency Medicine

## 2020-06-11 ENCOUNTER — Emergency Department
Admission: EM | Admit: 2020-06-11 | Discharge: 2020-06-11 | Disposition: A | Discharge status: Home / Self Care | Payer: Medicare Other | Attending: Emergency Medicine | Admitting: Emergency Medicine

## 2020-06-11 ENCOUNTER — Other Ambulatory Visit: Payer: Self-pay

## 2020-06-11 DIAGNOSIS — M722 Plantar fascial fibromatosis: Secondary | ICD-10-CM | POA: Insufficient documentation

## 2020-06-11 DIAGNOSIS — N189 Chronic kidney disease, unspecified: Secondary | ICD-10-CM | POA: Diagnosis not present

## 2020-06-11 DIAGNOSIS — E039 Hypothyroidism, unspecified: Secondary | ICD-10-CM | POA: Insufficient documentation

## 2020-06-11 DIAGNOSIS — Z86718 Personal history of other venous thrombosis and embolism: Secondary | ICD-10-CM | POA: Insufficient documentation

## 2020-06-11 DIAGNOSIS — I5030 Unspecified diastolic (congestive) heart failure: Secondary | ICD-10-CM | POA: Insufficient documentation

## 2020-06-11 DIAGNOSIS — Z79899 Other long term (current) drug therapy: Secondary | ICD-10-CM | POA: Insufficient documentation

## 2020-06-11 DIAGNOSIS — J45909 Unspecified asthma, uncomplicated: Secondary | ICD-10-CM | POA: Diagnosis not present

## 2020-06-11 DIAGNOSIS — Z96651 Presence of right artificial knee joint: Secondary | ICD-10-CM | POA: Insufficient documentation

## 2020-06-11 DIAGNOSIS — I739 Peripheral vascular disease, unspecified: Secondary | ICD-10-CM | POA: Diagnosis not present

## 2020-06-11 DIAGNOSIS — M79672 Pain in left foot: Secondary | ICD-10-CM | POA: Diagnosis present

## 2020-06-11 DIAGNOSIS — Z87891 Personal history of nicotine dependence: Secondary | ICD-10-CM | POA: Diagnosis not present

## 2020-06-11 NOTE — ED Provider Notes (Signed)
[facility] Emergency Department Provider Note  ____________________________________________   First MD Initiated Contact with Patient 06/11/20 1501     (approximate)  I have reviewed the triage vital signs and the nursing notes.   HISTORY  Chief Complaint Foot Pain    HPI Alexis Christensen is a 49 y.o. female presents emergency department complaining of left foot pain.  Patient was placed in a boot by podiatry to calm down plantar fasciitis.  Patient states she thinks she has a spur and he did not do an x-ray.  She is here for pain control.  Patient currently takes Suboxone.    Past Medical History:  Diagnosis Date  . Adopted   . Anemia   . Anxiety   . Arthritis   . Asthma   . Bipolar 1 disorder ([place])   . Bipolar disorder ([place])   . Carpal tunnel syndrome 09/15/2014  . CHF (congestive heart failure) (HCC)    Dialostic CHF  . Chronic diarrhea 09/07/2015  . Chronic kidney disease    H/O KIDNEY STONES  . Chronic venous insufficiency 04/26/2016  . Depression   . DVT (deep venous thrombosis) ([place]) 2016   RIGHT LEG  . Edema leg   . Effusion of knee 12/30/2013  . Family history of adverse reaction to anesthesia    ADOPTED  . GERD (gastroesophageal reflux disease)   . Goiter   . 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 Headache(784.0)    MIGRAINES  . Heart burn   . Heart murmur   . History of kidney stones   . HLD (hyperlipidemia)   . Hx MRSA infection   . Hypothyroidism   . Lipoma of arm 05/11/2013  . Lower extremity edema   . Lymphedema 04/26/2016  . Post traumatic stress disorder (PTSD)   . PVD (peripheral vascular disease) ([place])   . Seasonal allergies   . Stress incontinence   . Thyroid disease   . Urge incontinence     Patient Active Problem List   Diagnosis Date Noted  . Bilateral inguinal hernia without obstruction or gangrene   . Redness of eye, left 08/22/2016  . Iron deficiency anemia 07/11/2016  . ANA positive 06/13/2016  .  Bilateral hand pain 06/13/2016  . Lymphedema 04/26/2016  . Chronic venous insufficiency 04/26/2016  . Swelling of limb 04/26/2016  . Chronic diarrhea 09/07/2015  . Fever 09/07/2015  . Hypothyroidism 09/07/2015  . Hypovitaminosis D 09/07/2015  . Obesity 09/07/2015  . Weight loss, unintentional 09/07/2015  . Bipolar mixed affective disorder, moderate ([place]) 09/05/2015  . SUI (stress urinary incontinence, female) 05/18/2015  . Pain in shoulder 09/15/2014  . Carpal tunnel syndrome 09/15/2014  . Chronic left shoulder pain 09/15/2014  . Effusion of knee 12/30/2013  . 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 Gonalgia 12/30/2013  . Lipoma of arm 05/11/2013    Past Surgical History:  Procedure Laterality Date  . ABDOMINAL HYSTERECTOMY    . ANKLE SURGERY    . CHOLECYSTECTOMY    . COLONOSCOPY WITH PROPOFOL N/A 06/24/2019   Procedure: COLONOSCOPY WITH PROPOFOL;  Surgeon: Toledo, Benay Pike, MD;  Location: [facility] ENDOSCOPY;  Service: Gastroenterology;  Laterality: N/A;  . CYSTOSCOPY N/A 04/09/2016   Procedure: CYSTOSCOPY;  Surgeon: Bjorn Loser, MD;  Location: [facility] ORS;  Service: Urology;  Laterality: N/A;  . DILATION AND CURETTAGE OF UTERUS    . ESOPHAGOGASTRODUODENOSCOPY (EGD) WITH PROPOFOL N/A 06/24/2019   Procedure: ESOPHAGOGASTRODUODENOSCOPY (EGD) WITH PROPOFOL;  Surgeon: Alice Reichert, Benay Pike, MD;  Location: [facility] ENDOSCOPY;  Service: Gastroenterology;  Laterality: N/A;  . INGUINAL HERNIA REPAIR Bilateral 04/18/2017   Procedure: LAPAROSCOPIC BILATERAL INGUINAL HERNIA REPAIR;  Surgeon: Jules Husbands, MD;  Location: [facility] ORS;  Service: General;  Laterality: Bilateral;  . INTERSTIM IMPLANT PLACEMENT N/A 11/26/2016   Procedure: Barrie Lyme IMPLANT FIRST STAGE;  Surgeon: Bjorn Loser, MD;  Location: [facility] ORS;  Service: Urology;  Laterality: N/A;  . INTERSTIM IMPLANT PLACEMENT N/A 11/26/2016   Procedure: Barrie Lyme IMPLANT SECOND STAGE;  Surgeon: Bjorn Loser, MD;  Location: [facility] ORS;  Service:  Urology;  Laterality: N/A;  . JOINT REPLACEMENT Right    knee  . KNEE ARTHROSCOPY Bilateral   . KNEE ARTHROSCOPY WITH LATERAL RELEASE Left 10/18/2015   Procedure: KNEE ARTHROSCOPY LATERAL AND PARTIAL SYNOVECTOMY;  Surgeon: Hessie Knows, MD;  Location: [facility] ORS;  Service: Orthopedics;  Laterality: Left;  . Lymph Node removal  2015   Neck  . PUBOVAGINAL SLING N/A 04/09/2016   Procedure: PUBO-VAGINAL SLING/ RETROPUBIC SLING;  Surgeon: Bjorn Loser, MD;  Location: [facility] ORS;  Service: Urology;  Laterality: N/A;  . REPLACEMENT TOTAL KNEE Right   . SHOULDER SURGERY Right 2014  . TUBAL LIGATION    . WRIST SURGERY Right    metal plate    Prior to Admission medications   Medication Sig Start Date End Date Taking? Authorizing Provider  amitriptyline (ELAVIL) 50 MG tablet Take 50 mg by mouth at bedtime.    [provider]  ARIPiprazole (ABILIFY MAINTENA IM) Inject 400 mg into the muscle every 30 (thirty) days.    [provider]  baclofen (LIORESAL) 10 MG tablet Take 10 mg by mouth 2 (two) times daily.    [provider]  clobetasol cream (TEMOVATE) 2.70 % Apply 1 application topically 2 (two) times daily as needed (for rash).     [provider]  colestipol (COLESTID) 1 g tablet Take 2 g by mouth daily at 12 noon.     [provider]  cyclobenzaprine (FLEXERIL) 10 MG tablet Take 10 mg by mouth 2 (two) times daily as needed for muscle spasms.  03/29/17   [provider]  diclofenac sodium (VOLTAREN) 1 % GEL Apply 2 g topically 4 (four) times daily. 12/23/17   Laban Emperor, PA-C  DULoxetine (CYMBALTA) 60 MG capsule Take 60 mg by mouth at bedtime.     [provider]  EPINEPHrine 0.3 mg/0.3 mL IJ SOAJ injection Inject 0.3 mg into the muscle as needed for anaphylaxis.    [provider]  fluticasone (FLONASE) 50 MCG/ACT nasal spray Place 2 sprays into both nostrils daily.     [provider]  furosemide (LASIX) 20 MG  tablet Take 20 mg by mouth 2 (two) times daily. 02/19/19   [provider]  gabapentin (NEURONTIN) 600 MG tablet Take 600 mg by mouth 2 (two) times daily.     [provider]  hydrOXYzine (ATARAX/VISTARIL) 25 MG tablet Take 50 mg by mouth every 8 (eight) hours as needed for itching.    [provider]  levocetirizine (XYZAL) 5 MG tablet Take 5 mg by mouth every evening.    [provider]  levothyroxine (SYNTHROID, LEVOTHROID) 150 MCG tablet Take 150 mcg by mouth daily before breakfast.  02/24/16   [provider]  metolazone (ZAROXOLYN) 2.5 MG tablet Take 2.5 mg by mouth 2 (two) times daily.    [provider]  Olopatadine HCl (PATADAY) 0.2 % SOLN Apply to eye.    [provider]  omeprazole (PRILOSEC) 20 MG capsule Take 20 mg by mouth daily.    [provider]  ondansetron (ZOFRAN ODT) 4 MG disintegrating tablet Take 1 tablet (4 mg total) by mouth every 8 (eight) hours as needed for nausea or vomiting. Patient not taking: Reported on 05/14/2019 01/25/18   Paulette Blanch, MD  potassium chloride SA (KLOR-CON) 20 MEQ tablet Take 20 mEq by mouth 2 (two) times daily.    [provider]  prazosin (MINIPRESS) 2 MG capsule Take 2 mg by mouth at bedtime.    [provider]  ranitidine (ZANTAC) 150 MG tablet Take 150 mg by mouth 2 (two) times daily.    [provider]  rizatriptan (MAXALT) 5 MG tablet Take 5 mg by mouth as needed for migraine. May repeat in 2 hours if needed    [provider]  rosuvastatin (CRESTOR) 40 MG tablet Take 40 mg by mouth daily.    [provider]  spironolactone (ALDACTONE) 25 MG tablet Take 12.5 mg by mouth daily.    [provider]  SUBOXONE 8-2 MG FILM Dissolve 1 film under tongue twice daily 09/14/16   [provider]    Allergies Geodon [ziprasidone hydrochloride], Sulfacetamide sodium, Ziprasidone, Ziprasidone hcl, Ace inhibitors, Erythromycin,  Hydromorphone, Lamictal [lamotrigine], Strawberry (diagnostic), and Sulfa antibiotics  Family History  Adopted: Yes  Family history unknown: Yes    Social History Social History   Tobacco Use  . Smoking status: Former Smoker    Packs/day: 0.25    Years: 20.00    Pack years: 5.00    Types: Cigarettes    Quit date: 11/13/2012    Years since quitting: 7.5  . Smokeless tobacco: Former Systems developer  . Tobacco comment: STRESS RELATED  Vaping Use  . Vaping Use: Never used  Substance Use Topics  . Alcohol use: Not Currently    Alcohol/week: 0.0 standard drinks    Comment: occasional  . Drug use: No    Review of Systems  Constitutional: No fever/chills Eyes: No visual changes. ENT: No sore throat. Respiratory: Denies cough Genitourinary: Negative for dysuria. Musculoskeletal: Negative for back pain.  Positive for left foot pain Skin: Negative for rash. Psychiatric: no mood changes,     ____________________________________________   PHYSICAL EXAM:  VITAL SIGNS: ED Triage Vitals  Enc Vitals Group     BP 06/11/20 1408 112/62     Pulse Rate 06/11/20 1408 71     Resp 06/11/20 1408 16     Temp 06/11/20 1408 98.6 F (37 C)     Temp Source 06/11/20 1408 Oral     SpO2 06/11/20 1408 97 %     Weight 06/11/20 1405 220 lb 0.3 oz (99.8 kg)     Height 06/11/20 1405 5\' 4"  (1.626 m)     Head Circumference --      Peak Flow --      Pain Score 06/11/20 1405 9     Pain Loc --      Pain Edu? --      Excl. in [place]? --     Constitutional: Alert and oriented. Well appearing and in no acute distress. Eyes: Conjunctivae are normal.  Head: Atraumatic. Nose: No congestion/rhinnorhea. Mouth/Throat: Mucous membranes are moist.   Neck:  supple no lymphadenopathy noted Cardiovascular: Normal rate, regular rhythm. Respiratory: Normal respiratory effort.  No retractions,  GU: deferred Musculoskeletal: FROM all extremities, warm and well perfused, left heel is tender to palpation, fossa and soft  tissue areas surrounding the  heel leading to the Achilles are tender to palpation, Achilles is minimally tender with no deficit noted.  Neurovascular is intact Neurologic:  Normal speech and language.  Skin:  Skin is warm, dry and intact. No rash noted. Psychiatric: Mood and affect are normal. Speech and behavior are normal.  ____________________________________________   LABS (all labs ordered are listed, but only abnormal results are displayed)  Labs Reviewed - No data to display ____________________________________________   ____________________________________________  RADIOLOGY  X-ray of the left foot  ____________________________________________   PROCEDURES  Procedure(s) performed: No  Procedures    ____________________________________________   INITIAL IMPRESSION / ASSESSMENT AND PLAN / ED COURSE  Pertinent labs & imaging results that were available during my care of the patient were reviewed by me and considered in my medical decision making (see chart for details).   Patient is a 49 year old female with history of plantar fasciitis presents with increasing left foot pain.  See HPI.  Physical exam shows the left heel to be tender.  Explained findings to the patient.  Went into detail about plantar fasciitis.  We will do x-ray to ensure there is no other pathology.  X-ray of the left foot   X-ray of the left foot shows questionable fracture along cuboid bone but is not in an area for the patient is tender.  Feel this is a benign finding.  Also she has the beginnings of spurs at the calcaneus.  I did review the images and the radiologist report.  I explained all results to the patient.  She is to remain in her boot.  Follow-up with orthopedics.  She is asking for second opinion gave her the phone number to Dr. Milinda Pointer.  She is taking over-the-counter ibuprofen or Aleve.  Explained her narcotics will not help her at this time.  She was discharged in stable  condition.  Alexis Christensen was evaluated in Emergency Department on 06/11/2020 for the symptoms described in the history of present illness. She was evaluated in the context of the global COVID-19 pandemic, which necessitated consideration that the patient might be at risk for infection with the SARS-CoV-2 virus that causes COVID-19. Institutional protocols and algorithms that pertain to the evaluation of patients at risk for COVID-19 are in a state of rapid change based on information released by regulatory bodies including the CDC and federal and state organizations. These policies and algorithms were followed during the patient's care in the ED.    As part of my medical decision making, I reviewed the following data within the [place] notes reviewed and incorporated, Old chart reviewed, Radiograph reviewed , Notes from prior ED visits and [place] Controlled Substance Database  ____________________________________________   FINAL CLINICAL IMPRESSION(S) / ED DIAGNOSES  Final diagnoses:  Plantar fasciitis of left foot      NEW MEDICATIONS STARTED DURING THIS VISIT:  Discharge Medication List as of 06/11/2020  4:46 PM       Note:  This document was prepared using Dragon voice recognition software and may include unintentional dictation errors.    Versie Starks, PA-C 06/11/20 1746    Lavonia Drafts, MD 06/12/20 [phone]

## 2020-06-11 NOTE — Discharge Instructions (Addendum)
Follow-up with either [facility] clinic podiatry or Dr. Milinda Pointer.  Please call for an appointment.  Apply ice to the foot.  Take over-the-counter ibuprofen or Aleve as needed.  Return if worsening This is a chronic problem and most likely will give you intermittent pain.  Anti-inflammatories are usually in the medication of choice.

## 2020-06-11 NOTE — ED Notes (Signed)
Pt reports was seen by her MD and dx'd with plantar fascitis. Pt states she wears the boot like she is supposed to but needs something for pain control.

## 2020-06-11 NOTE — ED Triage Notes (Signed)
Pt reports pain to her left heel, pt states was dx;d with plantar fascitis and placed in a boot and came here for pain control.

## 2020-07-09 ENCOUNTER — Emergency Department: Payer: Medicare Other

## 2020-07-09 ENCOUNTER — Emergency Department
Admission: EM | Admit: 2020-07-09 | Discharge: 2020-07-09 | Disposition: A | Discharge status: Home / Self Care | Payer: Medicare Other | Attending: Emergency Medicine | Admitting: Emergency Medicine

## 2020-07-09 ENCOUNTER — Other Ambulatory Visit: Payer: Self-pay

## 2020-07-09 DIAGNOSIS — Z7952 Long term (current) use of systemic steroids: Secondary | ICD-10-CM | POA: Insufficient documentation

## 2020-07-09 DIAGNOSIS — R059 Cough, unspecified: Secondary | ICD-10-CM | POA: Insufficient documentation

## 2020-07-09 DIAGNOSIS — Z20822 Contact with and (suspected) exposure to covid-19: Secondary | ICD-10-CM | POA: Diagnosis not present

## 2020-07-09 DIAGNOSIS — I5031 Acute diastolic (congestive) heart failure: Secondary | ICD-10-CM | POA: Diagnosis not present

## 2020-07-09 DIAGNOSIS — Z79899 Other long term (current) drug therapy: Secondary | ICD-10-CM | POA: Diagnosis not present

## 2020-07-09 DIAGNOSIS — R0602 Shortness of breath: Secondary | ICD-10-CM | POA: Insufficient documentation

## 2020-07-09 DIAGNOSIS — Z96651 Presence of right artificial knee joint: Secondary | ICD-10-CM | POA: Diagnosis not present

## 2020-07-09 DIAGNOSIS — E039 Hypothyroidism, unspecified: Secondary | ICD-10-CM | POA: Diagnosis not present

## 2020-07-09 DIAGNOSIS — N189 Chronic kidney disease, unspecified: Secondary | ICD-10-CM | POA: Insufficient documentation

## 2020-07-09 DIAGNOSIS — Z7901 Long term (current) use of anticoagulants: Secondary | ICD-10-CM | POA: Diagnosis not present

## 2020-07-09 DIAGNOSIS — J45909 Unspecified asthma, uncomplicated: Secondary | ICD-10-CM | POA: Insufficient documentation

## 2020-07-09 DIAGNOSIS — Z87891 Personal history of nicotine dependence: Secondary | ICD-10-CM | POA: Insufficient documentation

## 2020-07-09 LAB — BASIC METABOLIC PANEL
Anion gap: 8 (ref 5–15)
BUN: 13 mg/dL (ref 6–20)
CO2: 27 mmol/L (ref 22–32)
Calcium: 8.7 mg/dL — ABNORMAL LOW (ref 8.9–10.3)
Chloride: 105 mmol/L (ref 98–111)
Creatinine, Ser: 0.74 mg/dL (ref 0.44–1.00)
GFR, Estimated: >60 mL/min (ref >60)
Glucose, Bld: 116 mg/dL — ABNORMAL HIGH (ref 70–99)
Potassium: 4.1 mmol/L (ref 3.5–5.1)
Sodium: 140 mmol/L (ref 135–145)

## 2020-07-09 LAB — CBC
HCT: 39.1 % (ref 36.0–46.0)
Hemoglobin: 12.6 g/dL (ref 12.0–15.0)
MCH: 29.9 pg (ref 26.0–34.0)
MCHC: 32.2 g/dL (ref 30.0–36.0)
MCV: 92.9 fL (ref 80.0–100.0)
Platelets: 257 10*3/uL (ref 150–400)
RBC: 4.21 MIL/uL (ref 3.87–5.11)
RDW: 12.1 % (ref 11.5–15.5)
WBC: 7.4 10*3/uL (ref 4.0–10.5)
nRBC: 0 % (ref 0.0–0.2)

## 2020-07-09 LAB — RESP PANEL BY RT-PCR (FLU A&B, COVID) ARPGX2
Influenza A by PCR: NEGATIVE
Influenza B by PCR: NEGATIVE
SARS Coronavirus 2 by RT PCR: NEGATIVE

## 2020-07-09 MED ORDER — IPRATROPIUM-ALBUTEROL 0.5-2.5 (3) MG/3ML IN SOLN
RESPIRATORY_TRACT | Status: AC
  Filled 2020-07-09: qty 3

## 2020-07-09 MED ORDER — IPRATROPIUM-ALBUTEROL 0.5-2.5 (3) MG/3ML IN SOLN
3.0000 mL | Freq: Once | RESPIRATORY_TRACT | Status: AC
  Administered 2020-07-09: 3 mL via RESPIRATORY_TRACT

## 2020-07-09 NOTE — ED Triage Notes (Signed)
Pt was exposed to someone Monday who tested positive today. Pt has been having sob, diarrhea.

## 2020-07-10 ENCOUNTER — Other Ambulatory Visit: Payer: Self-pay

## 2020-07-10 ENCOUNTER — Encounter: Payer: Self-pay | Admitting: Emergency Medicine

## 2020-07-10 ENCOUNTER — Emergency Department
Admission: EM | Admit: 2020-07-10 | Discharge: 2020-07-10 | Disposition: A | Discharge status: Home / Self Care | Payer: Medicare Other | Attending: Emergency Medicine | Admitting: Emergency Medicine

## 2020-07-10 DIAGNOSIS — Z96651 Presence of right artificial knee joint: Secondary | ICD-10-CM | POA: Diagnosis not present

## 2020-07-10 DIAGNOSIS — R197 Diarrhea, unspecified: Secondary | ICD-10-CM

## 2020-07-10 DIAGNOSIS — Z87891 Personal history of nicotine dependence: Secondary | ICD-10-CM | POA: Diagnosis not present

## 2020-07-10 DIAGNOSIS — J45909 Unspecified asthma, uncomplicated: Secondary | ICD-10-CM | POA: Insufficient documentation

## 2020-07-10 DIAGNOSIS — Z20822 Contact with and (suspected) exposure to covid-19: Secondary | ICD-10-CM | POA: Diagnosis not present

## 2020-07-10 DIAGNOSIS — R112 Nausea with vomiting, unspecified: Secondary | ICD-10-CM | POA: Insufficient documentation

## 2020-07-10 DIAGNOSIS — I509 Heart failure, unspecified: Secondary | ICD-10-CM | POA: Diagnosis not present

## 2020-07-10 DIAGNOSIS — N189 Chronic kidney disease, unspecified: Secondary | ICD-10-CM | POA: Diagnosis not present

## 2020-07-10 DIAGNOSIS — E039 Hypothyroidism, unspecified: Secondary | ICD-10-CM | POA: Insufficient documentation

## 2020-07-10 DIAGNOSIS — Z79899 Other long term (current) drug therapy: Secondary | ICD-10-CM | POA: Insufficient documentation

## 2020-07-10 DIAGNOSIS — R059 Cough, unspecified: Secondary | ICD-10-CM

## 2020-07-10 LAB — CBC
HCT: 39.3 % (ref 36.0–46.0)
Hemoglobin: 12.7 g/dL (ref 12.0–15.0)
MCH: 29.8 pg (ref 26.0–34.0)
MCHC: 32.3 g/dL (ref 30.0–36.0)
MCV: 92.3 fL (ref 80.0–100.0)
Platelets: 266 10*3/uL (ref 150–400)
RBC: 4.26 MIL/uL (ref 3.87–5.11)
RDW: 12.2 % (ref 11.5–15.5)
WBC: 7.8 10*3/uL (ref 4.0–10.5)
nRBC: 0 % (ref 0.0–0.2)

## 2020-07-10 LAB — COMPREHENSIVE METABOLIC PANEL
ALT: 18 U/L (ref 0–44)
AST: 18 U/L (ref 15–41)
Albumin: 3.7 g/dL (ref 3.5–5.0)
Alkaline Phosphatase: 80 U/L (ref 38–126)
Anion gap: 6 (ref 5–15)
BUN: 12 mg/dL (ref 6–20)
CO2: 29 mmol/L (ref 22–32)
Calcium: 8.8 mg/dL — ABNORMAL LOW (ref 8.9–10.3)
Chloride: 108 mmol/L (ref 98–111)
Creatinine, Ser: 0.69 mg/dL (ref 0.44–1.00)
GFR, Estimated: >60 mL/min (ref >60)
Glucose, Bld: 103 mg/dL — ABNORMAL HIGH (ref 70–99)
Potassium: 4 mmol/L (ref 3.5–5.1)
Sodium: 143 mmol/L (ref 135–145)
Total Bilirubin: 0.5 mg/dL (ref 0.3–1.2)
Total Protein: 6.7 g/dL (ref 6.5–8.1)

## 2020-07-10 LAB — RESP PANEL BY RT-PCR (FLU A&B, COVID) ARPGX2
Influenza A by PCR: NEGATIVE
Influenza B by PCR: NEGATIVE
SARS Coronavirus 2 by RT PCR: NEGATIVE

## 2020-07-10 LAB — GROUP A STREP BY PCR: Group A Strep by PCR: NOT DETECTED

## 2020-07-10 LAB — LIPASE, BLOOD: Lipase: 26 U/L (ref 11–51)

## 2020-07-10 MED ORDER — ONDANSETRON 4 MG PO TBDP: 4.0000 mg | ORAL_TABLET | Freq: Three times a day (TID) | ORAL | 0 refills | Status: DC | PRN

## 2020-07-10 MED ORDER — ONDANSETRON 4 MG PO TBDP
8.0000 mg | ORAL_TABLET | Freq: Once | ORAL | Status: AC
  Administered 2020-07-10: 22:00:00 8 mg via ORAL
  Filled 2020-07-10: qty 2

## 2020-07-10 MED ORDER — IBUPROFEN 600 MG PO TABS
600.0000 mg | ORAL_TABLET | Freq: Once | ORAL | Status: AC
  Administered 2020-07-10: 22:00:00 600 mg via ORAL
  Filled 2020-07-10: qty 1

## 2020-07-10 NOTE — ED Triage Notes (Signed)
Patient with complaint of diarrhea that started yesterday. Patient with complaint of sore throat, vomiting and cough that started today.

## 2020-07-10 NOTE — ED Provider Notes (Signed)
[facility] Emergency Department Provider Note   ____________________________________________   Event Date/Time   First MD Initiated Contact with Patient 07/10/20 2135     (approximate)  I have reviewed the triage vital signs and the nursing notes.   HISTORY  Chief Complaint Diarrhea, Cough, Sore Throat, and Emesis    HPI Alexis Christensen is a 49 y.o. female with a past medical history of bipolar disorder, CHF, PTSD, and chronic diarrhea who presents for diarrhea that began yesterday as well as associated sore throat, vomiting, and cough that began today.  Patient denies any exacerbating or relieving factors.  Patient states that she has been p.o. intolerant for the last 12 hours.  Patient denies any abdominal pain.  Patient currently denies any vision changes, tinnitus, difficulty speaking, facial droop, chest pain, shortness of breath, abdominal pain, dysuria, or weakness/numbness/paresthesias in any extremity         Past Medical History:  Diagnosis Date  . Adopted   . Anemia   . Anxiety   . Arthritis   . Asthma   . Bipolar 1 disorder (HCC)   . Bipolar disorder (HCC)   . Carpal tunnel syndrome 09/15/2014  . CHF (congestive heart failure) (HCC)    Dialostic CHF  . Chronic diarrhea 09/07/2015  . Chronic kidney disease    H/O KIDNEY STONES  . Chronic venous insufficiency 04/26/2016  . Depression   . DVT (deep venous thrombosis) (HCC) 2016   RIGHT LEG  . Edema leg   . Effusion of knee 12/30/2013  . Family history of adverse reaction to anesthesia    ADOPTED  . GERD (gastroesophageal reflux disease)   . Goiter   . 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 Headache(784.0)    MIGRAINES  . Heart burn   . Heart murmur   . History of kidney stones   . HLD (hyperlipidemia)   . Hx MRSA infection   . Hypothyroidism   . Lipoma of arm 05/11/2013  . Lower extremity edema   . Lymphedema 04/26/2016  . Post traumatic stress disorder (PTSD)   . PVD  (peripheral vascular disease) (HCC)   . Seasonal allergies   . Stress incontinence   . Thyroid disease   . Urge incontinence     Patient Active Problem List   Diagnosis Date Noted  . Bilateral inguinal hernia without obstruction or gangrene   . Redness of eye, left 08/22/2016  . Iron deficiency anemia 07/11/2016  . ANA positive 06/13/2016  . Bilateral hand pain 06/13/2016  . Lymphedema 04/26/2016  . Chronic venous insufficiency 04/26/2016  . Swelling of limb 04/26/2016  . Chronic diarrhea 09/07/2015  . Fever 09/07/2015  . Hypothyroidism 09/07/2015  . Hypovitaminosis D 09/07/2015  . Obesity 09/07/2015  . Weight loss, unintentional 09/07/2015  . Bipolar mixed affective disorder, moderate (HCC) 09/05/2015  . SUI (stress urinary incontinence, female) 05/18/2015  . Pain in shoulder 09/15/2014  . Carpal tunnel syndrome 09/15/2014  . Chronic left shoulder pain 09/15/2014  . Effusion of knee 12/30/2013  . 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 Gonalgia 12/30/2013  . Lipoma of arm 05/11/2013    Past Surgical History:  Procedure Laterality Date  . ABDOMINAL HYSTERECTOMY    . ANKLE SURGERY    . CHOLECYSTECTOMY    . COLONOSCOPY WITH PROPOFOL N/A 06/24/2019   Procedure: COLONOSCOPY WITH PROPOFOL;  Surgeon: Toledo, Boykin Nearing, MD;  Location: [facility] ENDOSCOPY;  Service: Gastroenterology;  Laterality: N/A;  . CYSTOSCOPY N/A 04/09/2016  Procedure: CYSTOSCOPY;  Surgeon: Bjorn Loser, MD;  Location: [facility] ORS;  Service: Urology;  Laterality: N/A;  . DILATION AND CURETTAGE OF UTERUS    . ESOPHAGOGASTRODUODENOSCOPY (EGD) WITH PROPOFOL N/A 06/24/2019   Procedure: ESOPHAGOGASTRODUODENOSCOPY (EGD) WITH PROPOFOL;  Surgeon: Toledo, Benay Pike, MD;  Location: [facility] ENDOSCOPY;  Service: Gastroenterology;  Laterality: N/A;  . INGUINAL HERNIA REPAIR Bilateral 04/18/2017   Procedure: LAPAROSCOPIC BILATERAL INGUINAL HERNIA REPAIR;  Surgeon: Jules Husbands, MD;  Location: [facility] ORS;  Service: General;   Laterality: Bilateral;  . INTERSTIM IMPLANT PLACEMENT N/A 11/26/2016   Procedure: Barrie Lyme IMPLANT FIRST STAGE;  Surgeon: Bjorn Loser, MD;  Location: [facility] ORS;  Service: Urology;  Laterality: N/A;  . INTERSTIM IMPLANT PLACEMENT N/A 11/26/2016   Procedure: Barrie Lyme IMPLANT SECOND STAGE;  Surgeon: Bjorn Loser, MD;  Location: [facility] ORS;  Service: Urology;  Laterality: N/A;  . JOINT REPLACEMENT Right    knee  . KNEE ARTHROSCOPY Bilateral   . KNEE ARTHROSCOPY WITH LATERAL RELEASE Left 10/18/2015   Procedure: KNEE ARTHROSCOPY LATERAL AND PARTIAL SYNOVECTOMY;  Surgeon: Hessie Knows, MD;  Location: [facility] ORS;  Service: Orthopedics;  Laterality: Left;  . Lymph Node removal  2015   Neck  . PUBOVAGINAL SLING N/A 04/09/2016   Procedure: PUBO-VAGINAL SLING/ RETROPUBIC SLING;  Surgeon: Bjorn Loser, MD;  Location: [facility] ORS;  Service: Urology;  Laterality: N/A;  . REPLACEMENT TOTAL KNEE Right   . SHOULDER SURGERY Right 2014  . TUBAL LIGATION    . WRIST SURGERY Right    metal plate    Prior to Admission medications   Medication Sig Start Date End Date Taking? Authorizing Provider  amitriptyline (ELAVIL) 50 MG tablet Take 50 mg by mouth at bedtime.    [provider]  ARIPiprazole (ABILIFY MAINTENA IM) Inject 400 mg into the muscle every 30 (thirty) days.    [provider]  baclofen (LIORESAL) 10 MG tablet Take 10 mg by mouth 2 (two) times daily.    [provider]  clobetasol cream (TEMOVATE) 0.97 % Apply 1 application topically 2 (two) times daily as needed (for rash).     [provider]  colestipol (COLESTID) 1 g tablet Take 2 g by mouth daily at 12 noon.     [provider]  cyclobenzaprine (FLEXERIL) 10 MG tablet Take 10 mg by mouth 2 (two) times daily as needed for muscle spasms.  03/29/17   [provider]  diclofenac sodium (VOLTAREN) 1 % GEL Apply 2 g topically 4 (four) times daily. 12/23/17   Laban Emperor, PA-C  DULoxetine  (CYMBALTA) 60 MG capsule Take 60 mg by mouth at bedtime.     [provider]  EPINEPHrine 0.3 mg/0.3 mL IJ SOAJ injection Inject 0.3 mg into the muscle as needed for anaphylaxis.    [provider]  fluticasone (FLONASE) 50 MCG/ACT nasal spray Place 2 sprays into both nostrils daily.     [provider]  furosemide (LASIX) 20 MG tablet Take 20 mg by mouth 2 (two) times daily. 02/19/19   [provider]  gabapentin (NEURONTIN) 600 MG tablet Take 600 mg by mouth 2 (two) times daily.     [provider]  hydrOXYzine (ATARAX/VISTARIL) 25 MG tablet Take 50 mg by mouth every 8 (eight) hours as needed for itching.    [provider]  levocetirizine (XYZAL) 5 MG tablet Take 5 mg by mouth every evening.    [provider]  levothyroxine (SYNTHROID, LEVOTHROID) 150 MCG tablet Take 150 mcg by  mouth daily before breakfast.  02/24/16   [provider]  metolazone (ZAROXOLYN) 2.5 MG tablet Take 2.5 mg by mouth 2 (two) times daily.    [provider]  Olopatadine HCl (PATADAY) 0.2 % SOLN Apply to eye.    [provider]  omeprazole (PRILOSEC) 20 MG capsule Take 20 mg by mouth daily.    [provider]  ondansetron (ZOFRAN ODT) 4 MG disintegrating tablet Take 1 tablet (4 mg total) by mouth every 8 (eight) hours as needed for nausea or vomiting. Patient not taking: Reported on 05/14/2019 01/25/18   Paulette Blanch, MD  ondansetron (ZOFRAN ODT) 4 MG disintegrating tablet Take 1 tablet (4 mg total) by mouth every 8 (eight) hours as needed for nausea or vomiting. 07/10/20   Naaman Plummer, MD  potassium chloride SA (KLOR-CON) 20 MEQ tablet Take 20 mEq by mouth 2 (two) times daily.    [provider]  prazosin (MINIPRESS) 2 MG capsule Take 2 mg by mouth at bedtime.    [provider]  ranitidine (ZANTAC) 150 MG tablet Take 150 mg by mouth 2 (two) times daily.    [provider]  rizatriptan (MAXALT) 5  MG tablet Take 5 mg by mouth as needed for migraine. May repeat in 2 hours if needed    [provider]  rosuvastatin (CRESTOR) 40 MG tablet Take 40 mg by mouth daily.    [provider]  spironolactone (ALDACTONE) 25 MG tablet Take 12.5 mg by mouth daily.    [provider]  SUBOXONE 8-2 MG FILM Dissolve 1 film under tongue twice daily 09/14/16   [provider]    Allergies Geodon [ziprasidone hydrochloride], Sulfacetamide sodium, Ziprasidone, Ziprasidone hcl, Ace inhibitors, Erythromycin, Hydromorphone, Lamictal [lamotrigine], Strawberry (diagnostic), and Sulfa antibiotics  Family History  Adopted: Yes  Family history unknown: Yes    Social History Social History   Tobacco Use  . Smoking status: Former Smoker    Packs/day: 0.25    Years: 20.00    Pack years: 5.00    Types: Cigarettes    Quit date: 11/13/2012    Years since quitting: 7.6  . Smokeless tobacco: Former Systems developer  . Tobacco comment: STRESS RELATED  Vaping Use  . Vaping Use: Never used  Substance Use Topics  . Alcohol use: Not Currently    Alcohol/week: 0.0 standard drinks    Comment: occasional  . Drug use: No    Review of Systems Constitutional: No fever/chills Eyes: No visual changes. ENT: Endorses sore throat. Cardiovascular: Denies chest pain. Respiratory: Denies shortness of breath. Gastrointestinal: No abdominal pain.  Endorses nausea/vomiting/diarrhea Genitourinary: Negative for dysuria. Musculoskeletal: Negative for acute arthralgias Skin: Negative for rash. Neurological: Negative for headaches, weakness/numbness/paresthesias in any extremity Psychiatric: Negative for suicidal ideation/homicidal ideation   ____________________________________________   PHYSICAL EXAM:  VITAL SIGNS: ED Triage Vitals  Enc Vitals Group     BP 07/10/20 1939 124/69     Pulse Rate 07/10/20 1939 69     Resp 07/10/20 1939 18     Temp 07/10/20 1939 98.9 F (37.2 C)     Temp src --       SpO2 07/10/20 1939 96 %     Weight 07/10/20 1940 220 lb (99.8 kg)     Height 07/10/20 1940 5\' 4"  (1.626 m)     Head Circumference --      Peak Flow --      Pain Score 07/10/20 1940 8     Pain  Loc --      Pain Edu? --      Excl. in [place]? --    Constitutional: Alert and oriented. Well appearing and in no acute distress. Eyes: Conjunctivae are normal. PERRL. Head: Atraumatic. Nose: No congestion/rhinnorhea. Mouth/Throat: Mucous membranes are moist. Neck: No stridor Cardiovascular: Grossly normal heart sounds.  Good peripheral circulation. Respiratory: Normal respiratory effort.  No retractions. Gastrointestinal: Soft and nontender. No distention. Musculoskeletal: No obvious deformities Neurologic:  Normal speech and language. No gross focal neurologic deficits are appreciated. Skin:  Skin is warm and dry. No rash noted. Psychiatric: Mood and affect are normal. Speech and behavior are normal.  ____________________________________________   LABS (all labs ordered are listed, but only abnormal results are displayed)  Labs Reviewed  COMPREHENSIVE METABOLIC PANEL - Abnormal; Notable for the following components:      Result Value   Glucose, Bld 103 (*)    Calcium 8.8 (*)    All other components within normal limits  RESP PANEL BY RT-PCR (FLU A&B, COVID) ARPGX2  GROUP A STREP BY PCR  LIPASE, BLOOD  CBC  URINALYSIS, COMPLETE (UACMP) WITH MICROSCOPIC    PROCEDURES  Procedure(s) performed (including Critical Care):  Procedures   ____________________________________________   INITIAL IMPRESSION / ASSESSMENT AND PLAN / ED COURSE  As part of my medical decision making, I reviewed the following data within the [facility] notes reviewed and incorporated, Labs reviewed, EKG interpreted, Old chart reviewed, Radiograph reviewed and Notes from prior ED visits reviewed and incorporated        Patient presents for acute nausea/vomiting The cause of the  patients symptoms is not clear, but the patient is overall well appearing and is suspected to have a transient course of illness.  Given History and Exam there does not appear to be an emergent cause of the symptoms such as small bowel obstruction, coronary syndrome, bowel ischemia, DKA, pancreatitis, appendicitis, other acute abdomen or other emergent problem.  Reassessment: After treatment, the patient is feeling much better, tolerating PO fluids, and shows no signs of dehydration.   Disposition: Discharge home with prompt primary care physician follow up in the next 48 hours. Strict return precautions discussed.      ____________________________________________   FINAL CLINICAL IMPRESSION(S) / ED DIAGNOSES  Final diagnoses:  Nausea vomiting and diarrhea  Cough     ED Discharge Orders         Ordered    ondansetron (ZOFRAN ODT) 4 MG disintegrating tablet  Every 8 hours PRN        07/10/20 2202           Note:  This document was prepared using Dragon voice recognition software and may include unintentional dictation errors.   Naaman Plummer, MD 07/10/20 [phone]

## 2020-07-10 NOTE — ED Notes (Signed)
Po challenge. Pt given gingerale and crackers.

## 2020-07-10 NOTE — ED Provider Notes (Signed)
[facility] Emergency Department Provider Note   ____________________________________________    I have reviewed the triage vital signs and the nursing notes.   HISTORY  Chief Complaint Shortness of Breath     HPI Alexis Christensen is a 49 y.o. female with history as noted below who presents with complaints of cough and shortness of breath.  Patient reports that she was quite short of breath prior to arrival but feeling better now.  She denies fevers or chills.  Had exposure to Covid positive patient on Monday without realizing it.  She reports her husband is having symptoms now as well.  Has not take anything for this.  Is vaccinated against COVID-19  Past Medical History:  Diagnosis Date  . Adopted   . Anemia   . Anxiety   . Arthritis   . Asthma   . Bipolar 1 disorder ([place])   . Bipolar disorder ([place])   . Carpal tunnel syndrome 09/15/2014  . CHF (congestive heart failure) (HCC)    Dialostic CHF  . Chronic diarrhea 09/07/2015  . Chronic kidney disease    H/O KIDNEY STONES  . Chronic venous insufficiency 04/26/2016  . Depression   . DVT (deep venous thrombosis) ([place]) 2016   RIGHT LEG  . Edema leg   . Effusion of knee 12/30/2013  . Family history of adverse reaction to anesthesia    ADOPTED  . GERD (gastroesophageal reflux disease)   . Goiter   . 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 Headache(784.0)    MIGRAINES  . Heart burn   . Heart murmur   . History of kidney stones   . HLD (hyperlipidemia)   . Hx MRSA infection   . Hypothyroidism   . Lipoma of arm 05/11/2013  . Lower extremity edema   . Lymphedema 04/26/2016  . Post traumatic stress disorder (PTSD)   . PVD (peripheral vascular disease) ([place] )   . Seasonal allergies   . Stress incontinence   . Thyroid disease   . Urge incontinence     Patient Active Problem List   Diagnosis Date Noted  . Bilateral inguinal hernia without obstruction or gangrene   . Redness of eye, left  08/22/2016  . Iron deficiency anemia 07/11/2016  . ANA positive 06/13/2016  . Bilateral hand pain 06/13/2016  . Lymphedema 04/26/2016  . Chronic venous insufficiency 04/26/2016  . Swelling of limb 04/26/2016  . Chronic diarrhea 09/07/2015  . Fever 09/07/2015  . Hypothyroidism 09/07/2015  . Hypovitaminosis D 09/07/2015  . Obesity 09/07/2015  . Weight loss, unintentional 09/07/2015  . Bipolar mixed affective disorder, moderate ([place]) 09/05/2015  . SUI (stress urinary incontinence, female) 05/18/2015  . Pain in shoulder 09/15/2014  . Carpal tunnel syndrome 09/15/2014  . Chronic left shoulder pain 09/15/2014  . Effusion of knee 12/30/2013  . 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 Gonalgia 12/30/2013  . Lipoma of arm 05/11/2013    Past Surgical History:  Procedure Laterality Date  . ABDOMINAL HYSTERECTOMY    . ANKLE SURGERY    . CHOLECYSTECTOMY    . COLONOSCOPY WITH PROPOFOL N/A 06/24/2019   Procedure: COLONOSCOPY WITH PROPOFOL;  Surgeon: Toledo, Benay Pike, MD;  Location: [facility];  Service: Gastroenterology;  Laterality: N/A;  . CYSTOSCOPY N/A 04/09/2016   Procedure: CYSTOSCOPY;  Surgeon: Bjorn Loser, MD;  Location: [facility];  Service: Urology;  Laterality: N/A;  . DILATION AND CURETTAGE OF UTERUS    . ESOPHAGOGASTRODUODENOSCOPY (EGD) WITH PROPOFOL N/A 06/24/2019  Procedure: ESOPHAGOGASTRODUODENOSCOPY (EGD) WITH PROPOFOL;  Surgeon: Toledo, Benay Pike, MD;  Location: [facility];  Service: Gastroenterology;  Laterality: N/A;  . INGUINAL HERNIA REPAIR Bilateral 04/18/2017   Procedure: LAPAROSCOPIC BILATERAL INGUINAL HERNIA REPAIR;  Surgeon: Jules Husbands, MD;  Location: [facility];  Service: General;  Laterality: Bilateral;  . INTERSTIM IMPLANT PLACEMENT N/A 11/26/2016   Procedure: Barrie Lyme IMPLANT FIRST STAGE;  Surgeon: Bjorn Loser, MD;  Location: [facility];  Service: Urology;  Laterality: N/A;  . INTERSTIM IMPLANT PLACEMENT N/A 11/26/2016   Procedure: Barrie Lyme  IMPLANT SECOND STAGE;  Surgeon: Bjorn Loser, MD;  Location: [facility];  Service: Urology;  Laterality: N/A;  . JOINT REPLACEMENT Right    knee  . KNEE ARTHROSCOPY Bilateral   . KNEE ARTHROSCOPY WITH LATERAL RELEASE Left 10/18/2015   Procedure: KNEE ARTHROSCOPY LATERAL AND PARTIAL SYNOVECTOMY;  Surgeon: Hessie Knows, MD;  Location: [facility];  Service: Orthopedics;  Laterality: Left;  . Lymph Node removal  2015   Neck  . PUBOVAGINAL SLING N/A 04/09/2016   Procedure: PUBO-VAGINAL SLING/ RETROPUBIC SLING;  Surgeon: Bjorn Loser, MD;  Location: [facility];  Service: Urology;  Laterality: N/A;  . REPLACEMENT TOTAL KNEE Right   . SHOULDER SURGERY Right 2014  . TUBAL LIGATION    . WRIST SURGERY Right    metal plate    Prior to Admission medications   Medication Sig Start Date End Date Taking? Authorizing Provider  amitriptyline (ELAVIL) 50 MG tablet Take 50 mg by mouth at bedtime.    [provider]  ARIPiprazole (ABILIFY MAINTENA IM) Inject 400 mg into the muscle every 30 (thirty) days.    [provider]  baclofen (LIORESAL) 10 MG tablet Take 10 mg by mouth 2 (two) times daily.    [provider]  clobetasol cream (TEMOVATE) [id number] % Apply 1 application topically 2 (two) times daily as needed (for rash).     [provider]  colestipol (COLESTID) 1 g tablet Take 2 g by mouth daily at 12 noon.     [provider]  cyclobenzaprine (FLEXERIL) 10 MG tablet Take 10 mg by mouth 2 (two) times daily as needed for muscle spasms.  03/29/17   [provider]  diclofenac sodium (VOLTAREN) 1 % GEL Apply 2 g topically 4 (four) times daily. 12/23/17   Laban Emperor, PA-C  DULoxetine (CYMBALTA) 60 MG capsule Take 60 mg by mouth at bedtime.     [provider]  EPINEPHrine 0.3 mg/0.3 mL IJ SOAJ injection Inject 0.3 mg into the muscle as needed for anaphylaxis.    [provider]  fluticasone (FLONASE) 50 MCG/ACT nasal spray Place 2 sprays  into both nostrils daily.     [provider]  furosemide (LASIX) 20 MG tablet Take 20 mg by mouth 2 (two) times daily. 02/19/19   [provider]  gabapentin (NEURONTIN) 600 MG tablet Take 600 mg by mouth 2 (two) times daily.     [provider]  hydrOXYzine (ATARAX/VISTARIL) 25 MG tablet Take 50 mg by mouth every 8 (eight) hours as needed for itching.    [provider]  levocetirizine (XYZAL) 5 MG tablet Take 5 mg by mouth every evening.    [provider]  levothyroxine (SYNTHROID, LEVOTHROID) 150 MCG tablet Take 150 mcg by mouth daily before breakfast.  02/24/16   [provider]  metolazone (ZAROXOLYN) 2.5 MG tablet Take 2.5 mg by mouth 2 (two) times daily.    [provider]  Olopatadine HCl (PATADAY)  0.2 % SOLN Apply to eye.    [provider]  omeprazole (PRILOSEC) 20 MG capsule Take 20 mg by mouth daily.    [provider]  ondansetron (ZOFRAN ODT) 4 MG disintegrating tablet Take 1 tablet (4 mg total) by mouth every 8 (eight) hours as needed for nausea or vomiting. Patient not taking: Reported on 05/14/2019 01/25/18   Paulette Blanch, MD  potassium chloride SA (KLOR-CON) 20 MEQ tablet Take 20 mEq by mouth 2 (two) times daily.    [provider]  prazosin (MINIPRESS) 2 MG capsule Take 2 mg by mouth at bedtime.    [provider]  ranitidine (ZANTAC) 150 MG tablet Take 150 mg by mouth 2 (two) times daily.    [provider]  rizatriptan (MAXALT) 5 MG tablet Take 5 mg by mouth as needed for migraine. May repeat in 2 hours if needed    [provider]  rosuvastatin (CRESTOR) 40 MG tablet Take 40 mg by mouth daily.    [provider]  spironolactone (ALDACTONE) 25 MG tablet Take 12.5 mg by mouth daily.    [provider]  SUBOXONE 8-2 MG FILM Dissolve 1 film under tongue twice daily 09/14/16   [provider]     Allergies Geodon [ziprasidone hydrochloride],  Sulfacetamide sodium, Ziprasidone, Ziprasidone hcl, Ace inhibitors, Erythromycin, Hydromorphone, Lamictal [lamotrigine], Strawberry (diagnostic), and Sulfa antibiotics  Family History  Adopted: Yes  Family history unknown: Yes    Social History Social History   Tobacco Use  . Smoking status: Former Smoker    Packs/day: 0.25    Years: 20.00    Pack years: 5.00    Types: Cigarettes    Quit date: 11/13/2012    Years since quitting: 7.6  . Smokeless tobacco: Former Systems developer  . Tobacco comment: STRESS RELATED  Vaping Use  . Vaping Use: Never used  Substance Use Topics  . Alcohol use: Not Currently    Alcohol/week: 0.0 standard drinks    Comment: occasional  . Drug use: No    Review of Systems  Constitutional: No fever/chills  ENT: No sore throat.   Gastrointestinal: No abdominal pain.   Genitourinary: Negative for dysuria. Musculoskeletal: Negative for back pain. Skin: Negative for rash. Neurological: Negative for headaches     ____________________________________________   PHYSICAL EXAM:  VITAL SIGNS: ED Triage Vitals [07/09/20 1933]  Enc Vitals Group     BP 127/68     Pulse Rate 66     Resp 18     Temp 98 F (36.7 C)     Temp Source Oral     SpO2 98 %     Weight 99 kg (218 lb 4.1 oz)     Height 1.626 m (5\' 4" )     Head Circumference      Peak Flow      Pain Score 5     Pain Loc      Pain Edu?      Excl. in [place]?     Constitutional: Alert and oriented. No acute distress. Pleasant and interactive Eyes: Conjunctivae are normal.  Head: Atraumatic. Nose: No congestion/rhinnorhea. Mouth/Throat: Mucous membranes are moist.   Cardiovascular: Normal rate, regular rhythm.  Respiratory: Normal respiratory effort.  No retractions.  Clear to auscultation bilaterally Genitourinary: deferred Musculoskeletal: No lower extremity tenderness nor edema.   Neurologic:  Normal speech and language. No gross focal neurologic deficits are appreciated.   Skin:  Skin is warm,  dry and intact. No rash  noted.   ____________________________________________   LABS (all labs ordered are listed, but only abnormal results are displayed)  Labs Reviewed  BASIC METABOLIC PANEL - Abnormal; Notable for the following components:      Result Value   Glucose, Bld 116 (*)    Calcium 8.7 (*)    All other components within normal limits  RESP PANEL BY RT-PCR (FLU A&B, COVID) ARPGX2  CBC   ____________________________________________  EKG  ED ECG REPORT I, Lavonia Drafts, the attending physician, personally viewed and interpreted this ECG.  Date: 07/10/2020  Rhythm: normal sinus rhythm QRS Axis: normal Intervals: Incomplete right bundle branch block ST/T Wave abnormalities: normal Narrative Interpretation: no evidence of acute ischemia  ____________________________________________  RADIOLOGY  Chest x-ray reviewed by me, no infiltrate or effusion ____________________________________________   PROCEDURES  Procedure(s) performed: No  Procedures   Critical Care performed: No ____________________________________________   INITIAL IMPRESSION / ASSESSMENT AND PLAN / ED COURSE  Pertinent labs & imaging results that were available during my care of the patient were reviewed by me and considered in my medical decision making (see chart for details).  Patient well-appearing and in no acute distress, chest x-ray is reassuring.  Lab work is unremarkable, Covid test is negative.  No pleurisy chest pain or leg swelling.  Treated with DuoNeb for possible bronchospasm.  Patient quite comfortable, vitals reassuring, no increased work of breathing, appropriate for discharge at this time   ____________________________________________   FINAL CLINICAL IMPRESSION(S) / ED DIAGNOSES  Final diagnoses:  Shortness of breath      NEW MEDICATIONS STARTED DURING THIS VISIT:  Discharge Medication List as of 07/09/2020 11:16 PM       Note:  This document was  prepared using Dragon voice recognition software and may include unintentional dictation errors.   Lavonia Drafts, MD 07/10/20 [phone]

## 2020-07-18 ENCOUNTER — Encounter: Payer: Self-pay | Admitting: *Deleted

## 2020-07-18 ENCOUNTER — Emergency Department
Admission: EM | Admit: 2020-07-18 | Discharge: 2020-07-19 | Disposition: A | Discharge status: Home / Self Care | Payer: Medicare Other | Attending: Emergency Medicine | Admitting: Emergency Medicine

## 2020-07-18 ENCOUNTER — Other Ambulatory Visit: Payer: Self-pay

## 2020-07-18 DIAGNOSIS — Z87891 Personal history of nicotine dependence: Secondary | ICD-10-CM | POA: Diagnosis not present

## 2020-07-18 DIAGNOSIS — E039 Hypothyroidism, unspecified: Secondary | ICD-10-CM | POA: Insufficient documentation

## 2020-07-18 DIAGNOSIS — R197 Diarrhea, unspecified: Secondary | ICD-10-CM | POA: Insufficient documentation

## 2020-07-18 DIAGNOSIS — J45909 Unspecified asthma, uncomplicated: Secondary | ICD-10-CM | POA: Diagnosis not present

## 2020-07-18 DIAGNOSIS — Z79899 Other long term (current) drug therapy: Secondary | ICD-10-CM | POA: Diagnosis not present

## 2020-07-18 DIAGNOSIS — N189 Chronic kidney disease, unspecified: Secondary | ICD-10-CM | POA: Diagnosis not present

## 2020-07-18 DIAGNOSIS — R1030 Lower abdominal pain, unspecified: Secondary | ICD-10-CM | POA: Insufficient documentation

## 2020-07-18 DIAGNOSIS — Z96652 Presence of left artificial knee joint: Secondary | ICD-10-CM | POA: Diagnosis not present

## 2020-07-18 DIAGNOSIS — R112 Nausea with vomiting, unspecified: Secondary | ICD-10-CM

## 2020-07-18 DIAGNOSIS — I5021 Acute systolic (congestive) heart failure: Secondary | ICD-10-CM | POA: Diagnosis not present

## 2020-07-18 LAB — COMPREHENSIVE METABOLIC PANEL
ALT: 20 U/L (ref 0–44)
AST: 21 U/L (ref 15–41)
Albumin: 3.8 g/dL (ref 3.5–5.0)
Alkaline Phosphatase: 76 U/L (ref 38–126)
Anion gap: 8 (ref 5–15)
BUN: 16 mg/dL (ref 6–20)
CO2: 29 mmol/L (ref 22–32)
Calcium: 8.8 mg/dL — ABNORMAL LOW (ref 8.9–10.3)
Chloride: 103 mmol/L (ref 98–111)
Creatinine, Ser: 0.54 mg/dL (ref 0.44–1.00)
GFR, Estimated: >60 mL/min (ref >60)
Glucose, Bld: 89 mg/dL (ref 70–99)
Potassium: 3.6 mmol/L (ref 3.5–5.1)
Sodium: 140 mmol/L (ref 135–145)
Total Bilirubin: 0.5 mg/dL (ref 0.3–1.2)
Total Protein: 7.2 g/dL (ref 6.5–8.1)

## 2020-07-18 LAB — CBC
HCT: 38.1 % (ref 36.0–46.0)
Hemoglobin: 12.3 g/dL (ref 12.0–15.0)
MCH: 29.4 pg (ref 26.0–34.0)
MCHC: 32.3 g/dL (ref 30.0–36.0)
MCV: 91.1 fL (ref 80.0–100.0)
Platelets: 275 10*3/uL (ref 150–400)
RBC: 4.18 MIL/uL (ref 3.87–5.11)
RDW: 12.2 % (ref 11.5–15.5)
WBC: 8.5 10*3/uL (ref 4.0–10.5)
nRBC: 0 % (ref 0.0–0.2)

## 2020-07-18 LAB — URINALYSIS, COMPLETE (UACMP) WITH MICROSCOPIC
Bilirubin Urine: NEGATIVE
Glucose, UA: NEGATIVE mg/dL
Hgb urine dipstick: NEGATIVE
Ketones, ur: NEGATIVE mg/dL
Leukocytes,Ua: NEGATIVE
Nitrite: NEGATIVE
Protein, ur: NEGATIVE mg/dL
Specific Gravity, Urine: 1.028 (ref 1.005–1.030)
pH: 6 (ref 5.0–8.0)

## 2020-07-18 LAB — LIPASE, BLOOD: Lipase: 24 U/L (ref 11–51)

## 2020-07-18 MED ORDER — KETOROLAC TROMETHAMINE 30 MG/ML IJ SOLN
15.0000 mg | Freq: Once | INTRAMUSCULAR | Status: AC
  Administered 2020-07-18: 15 mg via INTRAVENOUS
  Filled 2020-07-18: qty 1

## 2020-07-18 MED ORDER — ONDANSETRON HCL 4 MG/2ML IJ SOLN
4.0000 mg | INTRAMUSCULAR | Status: AC
  Administered 2020-07-18: 4 mg via INTRAVENOUS
  Filled 2020-07-18: qty 2

## 2020-07-18 NOTE — ED Triage Notes (Signed)
"  sharp shooting" abd pain starting this morning with diarrhea. Pt reports it feels similar to when she had Cdiff last year. Pt reporting she took antibiotics 1 month ago for a UTI. Nausea and 1 episode of vomiting also reported. No fevers at home. Pt took imodium at 1900 without relief.

## 2020-07-18 NOTE — ED Notes (Signed)
Pt aware a stool sample will be collected if she has a BM.

## 2020-07-18 NOTE — ED Provider Notes (Signed)
[facility] Emergency Department Provider Note  ____________________________________________   Event Date/Time   First MD Initiated Contact with Patient 07/18/20 2309     (approximate)  I have reviewed the triage vital signs and the nursing notes.   HISTORY  Chief Complaint Abdominal Pain    HPI Alexis Christensen is a 50 y.o. female    with medical history as listed below which notably includes a diarrheal illness about 15 months ago that turned out to be C. difficile, Campylobacter, and E. coli, as well as a history of extensive diverticular disease and prior diverticulitis.  She presents tonight for evaluation of acute onset sharp stabbing pain in the center part of her lower abdomen.  This is accompanied with extensive diarrhea, at least one episode every hour.  She last had antibiotics about a month ago for a urinary tract infection.  Her pain and diarrhea today are associated with nausea and one episode of emesis.  The nausea persist but she has not vomited anymore.  However she said that she has had extreme bowel urgency with the diarrhea and even had accidental loss of bowel continence while she was at work.  She describes the symptoms as severe, acute in onset, nothing in particular make it better or worse.  Of note, she takes Suboxone but said that she has had all of her recent doses and should not be experiencing withdrawal.  She denies fever, sore throat, chest pain, shortness of breath, and dysuria.  She is fully vaccinated for COVID-19.      Past Medical History:  Diagnosis Date  . Adopted   . Anemia   . Anxiety   . Arthritis   . Asthma   . Bipolar 1 disorder ([place])   . Bipolar disorder ([place])   . Carpal tunnel syndrome 09/15/2014  . CHF (congestive heart failure) (HCC)    Dialostic CHF  . Chronic diarrhea 09/07/2015  . Chronic kidney disease    H/O KIDNEY STONES  . Chronic venous insufficiency 04/26/2016  . Depression   . DVT (deep venous  thrombosis) ([place]) 2016   RIGHT LEG  . Edema leg   . Effusion of knee 12/30/2013  . Family history of adverse reaction to anesthesia    ADOPTED  . GERD (gastroesophageal reflux disease)   . Goiter   . 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 Headache(784.0)    MIGRAINES  . Heart burn   . Heart murmur   . History of kidney stones   . HLD (hyperlipidemia)   . Hx MRSA infection   . Hypothyroidism   . Lipoma of arm 05/11/2013  . Lower extremity edema   . Lymphedema 04/26/2016  . Post traumatic stress disorder (PTSD)   . PVD (peripheral vascular disease) ([place])   . Seasonal allergies   . Stress incontinence   . Thyroid disease   . Urge incontinence     Patient Active Problem List   Diagnosis Date Noted  . Bilateral inguinal hernia without obstruction or gangrene   . Redness of eye, left 08/22/2016  . Iron deficiency anemia 07/11/2016  . ANA positive 06/13/2016  . Bilateral hand pain 06/13/2016  . Lymphedema 04/26/2016  . Chronic venous insufficiency 04/26/2016  . Swelling of limb 04/26/2016  . Chronic diarrhea 09/07/2015  . Fever 09/07/2015  . Hypothyroidism 09/07/2015  . Hypovitaminosis D 09/07/2015  . Obesity 09/07/2015  . Weight loss, unintentional 09/07/2015  . Bipolar mixed affective disorder, moderate ([place]) 09/05/2015  . SUI (  stress urinary incontinence, female) 05/18/2015  . Pain in shoulder 09/15/2014  . Carpal tunnel syndrome 09/15/2014  . Chronic left shoulder pain 09/15/2014  . Effusion of knee 12/30/2013  . 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 Gonalgia 12/30/2013  . Lipoma of arm 05/11/2013    Past Surgical History:  Procedure Laterality Date  . ABDOMINAL HYSTERECTOMY    . ANKLE SURGERY    . CHOLECYSTECTOMY    . COLONOSCOPY WITH PROPOFOL N/A 06/24/2019   Procedure: COLONOSCOPY WITH PROPOFOL;  Surgeon: Toledo, Benay Pike, MD;  Location: [facility] ENDOSCOPY;  Service: Gastroenterology;  Laterality: N/A;  . CYSTOSCOPY N/A 04/09/2016   Procedure: CYSTOSCOPY;   Surgeon: Bjorn Loser, MD;  Location: [facility] ORS;  Service: Urology;  Laterality: N/A;  . DILATION AND CURETTAGE OF UTERUS    . ESOPHAGOGASTRODUODENOSCOPY (EGD) WITH PROPOFOL N/A 06/24/2019   Procedure: ESOPHAGOGASTRODUODENOSCOPY (EGD) WITH PROPOFOL;  Surgeon: Toledo, Benay Pike, MD;  Location: [facility] END[facility]COPY;  Service: Gastroenterology;  Laterality: N/A;  . INGUINAL HERNIA REPAIR Bilateral 04/18/2017   Procedure: LAPAROSCOPIC BILATERAL INGUINAL HERNIA REPAIR;  Surgeon: Jules Husbands, MD;  Location: [facility] ORS;  Service: General;  Laterality: Bilateral;  . INTERSTIM IMPLANT PLACEMENT N/A 11/26/2016   Procedure: Barrie Lyme IMPLANT FIRST STAGE;  Surgeon: Bjorn Loser, MD;  Location: [facility] ORS;  Service: Urology;  Laterality: N/A;  . INTERSTIM IMPLANT PLACEMENT N/A 11/26/2016   Procedure: Barrie Lyme IMPLANT SECOND STAGE;  Surgeon: Bjorn Loser, MD;  Location: [facility] ORS;  Service: Urology;  Laterality: N/A;  . JOINT REPLACEMENT Right    knee  . KNEE ARTHROSCOPY Bilateral   . KNEE ARTHROSCOPY WITH LATERAL RELEASE Left 10/18/2015   Procedure: KNEE ARTHROSCOPY LATERAL AND PARTIAL SYNOVECTOMY;  Surgeon: Hessie Knows, MD;  Location: [facility] ORS;  Service: Orthopedics;  Laterality: Left;  . Lymph Node removal  2015   Neck  . PUBOVAGINAL SLING N/A 04/09/2016   Procedure: PUBO-VAGINAL SLING/ RETROPUBIC SLING;  Surgeon: Bjorn Loser, MD;  Location: [facility] ORS;  Service: Urology;  Laterality: N/A;  . REPLACEMENT TOTAL KNEE Right   . SHOULDER SURGERY Right 2014  . TUBAL LIGATION    . WRIST SURGERY Right    metal plate    Prior to Admission medications   Medication Sig Start Date End Date Taking? Authorizing Provider  amitriptyline (ELAVIL) 50 MG tablet Take 50 mg by mouth at bedtime.    [provider]  ARIPiprazole (ABILIFY MAINTENA IM) Inject 400 mg into the muscle every 30 (thirty) days.    [provider]  baclofen (LIORESAL) 10 MG tablet Take 10 mg by mouth 2 (two) times daily.     [provider]  clobetasol cream (TEMOVATE) [id number] % Apply 1 application topically 2 (two) times daily as needed (for rash).     [provider]  colestipol (COLESTID) 1 g tablet Take 2 g by mouth daily at 12 noon.     [provider]  cyclobenzaprine (FLEXERIL) 10 MG tablet Take 10 mg by mouth 2 (two) times daily as needed for muscle spasms.  03/29/17   [provider]  diclofenac sodium (VOLTAREN) 1 % GEL Apply 2 g topically 4 (four) times daily. 12/23/17   Laban Emperor, PA-C  DULoxetine (CYMBALTA) 60 MG capsule Take 60 mg by mouth at bedtime.     [provider]  EPINEPHrine 0.3 mg/0.3 mL IJ SOAJ injection Inject 0.3 mg into the muscle as needed for anaphylaxis.    [provider]  fluticasone (FLONASE) 50 MCG/ACT nasal spray Place 2  sprays into both nostrils daily.     [provider]  furosemide (LASIX) 20 MG tablet Take 20 mg by mouth 2 (two) times daily. 02/19/19   [provider]  gabapentin (NEURONTIN) 600 MG tablet Take 600 mg by mouth 2 (two) times daily.     [provider]  hydrOXYzine (ATARAX/VISTARIL) 25 MG tablet Take 50 mg by mouth every 8 (eight) hours as needed for itching.    [provider]  levocetirizine (XYZAL) 5 MG tablet Take 5 mg by mouth every evening.    [provider]  levothyroxine (SYNTHROID, LEVOTHROID) 150 MCG tablet Take 150 mcg by mouth daily before breakfast.  02/24/16   [provider]  metolazone (ZAROXOLYN) 2.5 MG tablet Take 2.5 mg by mouth 2 (two) times daily.    [provider]  Olopatadine HCl (PATADAY) 0.2 % SOLN Apply to eye.    [provider]  omeprazole (PRILOSEC) 20 MG capsule Take 20 mg by mouth daily.    [provider]  ondansetron (ZOFRAN ODT) 4 MG disintegrating tablet Take 1 tablet (4 mg total) by mouth every 8 (eight) hours as needed for nausea or vomiting. Patient not taking: Reported on 05/14/2019 01/25/18    Irean Hong, MD  ondansetron (ZOFRAN ODT) 4 MG disintegrating tablet Take 1 tablet (4 mg total) by mouth every 8 (eight) hours as needed for nausea or vomiting. 07/10/20   Merwyn Katos, MD  potassium chloride SA (KLOR-CON) 20 MEQ tablet Take 20 mEq by mouth 2 (two) times daily.    [provider]  prazosin (MINIPRESS) 2 MG capsule Take 2 mg by mouth at bedtime.    [provider]  ranitidine (ZANTAC) 150 MG tablet Take 150 mg by mouth 2 (two) times daily.    [provider]  rizatriptan (MAXALT) 5 MG tablet Take 5 mg by mouth as needed for migraine. May repeat in 2 hours if needed    [provider]  rosuvastatin (CRESTOR) 40 MG tablet Take 40 mg by mouth daily.    [provider]  spironolactone (ALDACTONE) 25 MG tablet Take 12.5 mg by mouth daily.    [provider]  SUBOXONE 8-2 MG FILM Dissolve 1 film under tongue twice daily 09/14/16   [provider]    Allergies Geodon [ziprasidone hydrochloride], Sulfacetamide sodium, Ziprasidone, Ziprasidone hcl, Ace inhibitors, Erythromycin, Hydromorphone, Lamictal [lamotrigine], Strawberry (diagnostic), and Sulfa antibiotics  Family History  Adopted: Yes  Family history unknown: Yes    Social History Social History   Tobacco Use  . Smoking status: Former Smoker    Packs/day: 0.25    Years: 20.00    Pack years: 5.00    Types: Cigarettes    Quit date: 11/13/2012    Years since quitting: 7.6  . Smokeless tobacco: Former Neurosurgeon  . Tobacco comment: STRESS RELATED  Vaping Use  . Vaping Use: Never used  Substance Use Topics  . Alcohol use: Not Currently    Alcohol/week: 0.0 standard drinks    Comment: occasional  . Drug use: No    Review of Systems Constitutional: No fever/chills Eyes: No visual changes. ENT: No sore throat. Cardiovascular: Denies chest pain. Respiratory: Denies shortness of breath. Gastrointestinal: Positive for sharp central lower abdominal pain,  extensive diarrhea, and one episode of emesis with persistent nausea Genitourinary: Negative for dysuria. Musculoskeletal: Negative for neck pain.  Negative for back pain. Integumentary: Negative for rash. Neurological: Negative for headaches, focal weakness or numbness.  ____________________________________________   PHYSICAL EXAM:  VITAL SIGNS: ED Triage Vitals [07/18/20 2231]  Enc Vitals Group     BP 123/67     Pulse Rate 66     Resp 16     Temp 97.7 F (36.5 C)     Temp Source Oral     SpO2 97 %     Weight 99.8 kg (220 lb)     Height 1.626 m (5\' 4" )     Head Circumference      Peak Flow      Pain Score 8     Pain Loc      Pain Edu?      Excl. in [place]?     Constitutional: Alert and oriented.  Eyes: Conjunctivae are normal.  Head: Atraumatic. Nose: No congestion/rhinnorhea. Mouth/Throat: Patient is wearing a mask. Neck: No stridor.  No meningeal signs.   Cardiovascular: Normal rate, regular rhythm. Good peripheral circulation. Respiratory: Normal respiratory effort.  No retractions. Gastrointestinal: Soft and nondistended.  Moderate tenderness to palpation in the right lower quadrant and the suprapubic region, minimal tenderness to palpation in the left lower quadrant.  No rebound or guarding.  No upper abdominal tenderness. Musculoskeletal: No lower extremity tenderness nor edema. No gross deformities of extremities. Neurologic:  Normal speech and language. No gross focal neurologic deficits are appreciated.  Skin:  Skin is warm, dry and intact. Psychiatric: Mood and affect are normal. Speech and behavior are normal.  ____________________________________________   LABS (all labs ordered are listed, but only abnormal results are displayed)  Labs Reviewed  COMPREHENSIVE METABOLIC PANEL - Abnormal; Notable for the following components:      Result Value   Calcium 8.8 (*)    All other components within normal limits  URINALYSIS, COMPLETE (UACMP) WITH MICROSCOPIC  - Abnormal; Notable for the following components:   Color, Urine YELLOW (*)    APPearance HAZY (*)    Bacteria, UA RARE (*)    All other components within normal limits  C DIFFICILE QUICK SCREEN W PCR REFLEX  GASTROINTESTINAL PANEL BY PCR, STOOL (REPLACES STOOL CULTURE)  LIPASE, BLOOD  CBC   ____________________________________________  EKG  No indication for emergent EKG ____________________________________________  RADIOLOGY Ursula Alert, personally viewed and evaluated these images (plain radiographs) as part of my medical decision making, as well as reviewing the written report by the radiologist.  ED MD interpretation:  No acute abnormalities on CT abd/pelvis  Official radiology report(s): CT ABDOMEN PELVIS W CONTRAST  Result Date: 07/19/2020 CLINICAL DATA:  Nausea and vomiting for 1 day, abdominal pain EXAM: CT ABDOMEN AND PELVIS WITH CONTRAST TECHNIQUE: Multidetector CT imaging of the abdomen and pelvis was performed using the standard protocol following bolus administration of intravenous contrast. CONTRAST:  120mL OMNIPAQUE IOHEXOL 300 MG/ML  SOLN COMPARISON:  02/17/2020 FINDINGS: Lower chest: No acute pleural or parenchymal lung disease. Small hiatal hernia. Hepatobiliary: No focal liver abnormality is seen. Status post cholecystectomy. No biliary dilatation. Pancreas: Unremarkable. No pancreatic ductal dilatation or surrounding inflammatory changes. Spleen: Normal in size without focal abnormality. Adrenals/Urinary Tract: Adrenal glands are unremarkable. Kidneys are normal, without renal calculi, focal lesion, or hydronephrosis. Bladder is unremarkable. Stomach/Bowel: No bowel obstruction or ileus. Colonic diverticulosis without acute diverticulitis. Normal appendix right lower quadrant. No bowel wall thickening or inflammatory change. Vascular/Lymphatic: No significant vascular findings are present. No enlarged abdominal or pelvic lymph nodes. Reproductive: Status post  hysterectomy. No adnexal masses. Other: Nerve stimulator unchanged within the left buttock. No free intraperitoneal  fluid or free gas. No abdominal wall hernia. Musculoskeletal: No acute or destructive bony lesions. Reconstructed images demonstrate no additional findings. IMPRESSION: 1. No acute intra-abdominal or intrapelvic process. 2. Colonic diverticulosis without acute diverticulitis. 3. Small hiatal hernia. Electronically Signed   By: Sharlet Salina M.D.   On: 07/19/2020 00:31    ____________________________________________   PROCEDURES   Procedure(s) performed (including Critical Care):  Procedures   ____________________________________________   INITIAL IMPRESSION / MDM / ASSESSMENT AND PLAN / ED COURSE  As part of my medical decision making, I reviewed the following data within the electronic MEDICAL RECORD NUMBER Nursing notes reviewed and incorporated, Labs reviewed , EKG interpreted , Old chart reviewed, Notes from prior ED visits and [place] Controlled Substance Database   Differential diagnosis includes, but is not limited to, nonspecific viral gastroenteritis, any 1 of a number of specific viral or bacterial GI infections including though she has had in the past (Campylobacter, E. coli, C. difficile), diverticulitis, less likely appendicitis or ovarian cyst/torsion.  Patient's pain and tenderness is relatively minimal but it is present on exam.  I reviewed her medical record and saw that she has extensive diverticular disease and has had diverticulitis in the past, as well as the prior colitis pathogens seen on previous stool studies.  Fortunately her vital signs are within normal limits.  Her lab work is also normal including comprehensive metabolic panel, lipase, and CBC.  Urinalysis is unremarkable and she has no dysuria.  However, she has tenderness to palpation of the abdomen and the risk factors as described above.  I talked with her about the appropriate plan we decided to  proceed as follows: Toradol 15 mg IV, Zofran 4 mg IV, CT scan with IV contrast to evaluate for possible diverticulitis as well as colitis in general, and stool studies to include the GI pathogen panel and C. difficile.  Patient understands and agrees with the plan.     Clinical Course as of 07/19/20 0228  Tue Jul 19, 2020  0048 CT ABDOMEN PELVIS W CONTRAST No acute abnormalities identified on CT scan of the abdomen pelvis including no obvious colitis, no megacolon, no diverticulitis. [CF]  0227 It has been 4 hours and the patient has not been able to provide a stool specimen.  Given her reassuring vitals, normal labs, and reassuring CT scan, I recommended that we discharge her for symptomatic treatment at home and she agrees.  I gave my usual and customary management recommendations and return precautions. [CF]    Clinical Course User Index [CF] Loleta Rose, MD     ____________________________________________  FINAL CLINICAL IMPRESSION(S) / ED DIAGNOSES  Final diagnoses:  Nausea vomiting and diarrhea  Lower abdominal pain     MEDICATIONS GIVEN DURING THIS VISIT:  Medications  ondansetron (ZOFRAN) injection 4 mg (4 mg Intravenous Given 07/18/20 2342)  ketorolac (TORADOL) 30 MG/ML injection 15 mg (15 mg Intravenous Given 07/18/20 2342)  iohexol (OMNIPAQUE) 300 MG/ML solution 100 mL (100 mLs Intravenous Contrast Given 07/19/20 0016)     ED Discharge Orders    None      *Please note:  Alexis Christensen was evaluated in Emergency Department on 07/19/2020 for the symptoms described in the history of present illness. She was evaluated in the context of the global COVID-19 pandemic, which necessitated consideration that the patient might be at risk for infection with the SARS-CoV-2 virus that causes COVID-19. Institutional protocols and algorithms that pertain to the evaluation of patients at risk for COVID-19 are  in a state of rapid change based on information released by regulatory bodies  including the CDC and federal and state organizations. These policies and algorithms were followed during the patient's care in the ED.  Some ED evaluations and interventions may be delayed as a result of limited staffing during and after the pandemic.*  Note:  This document was prepared using Dragon voice recognition software and may include unintentional dictation errors.   Hinda Kehr, MD 07/19/20 [phone]

## 2020-07-19 ENCOUNTER — Encounter: Payer: Self-pay | Admitting: Radiology

## 2020-07-19 ENCOUNTER — Emergency Department: Payer: Medicare Other

## 2020-07-19 MED ORDER — IOHEXOL 300 MG/ML  SOLN
100.0000 mL | Freq: Once | INTRAMUSCULAR | Status: AC | PRN
  Administered 2020-07-19: 100 mL via INTRAVENOUS

## 2020-07-19 NOTE — ED Notes (Signed)
Pt resting in bed, unable to provide stool sample at this time. Pt denies needs

## 2020-07-19 NOTE — Discharge Instructions (Signed)
We believe your symptoms are caused by either a viral infection or possibly a bad food exposure.  Either way, since your symptoms have improved, we feel it is safe for you to go home and follow up with your regular doctor.  Please read the included information and stick to a bland diet for the next two days.  Drink plenty of clear fluids, and if you were provided with a prescription, please take it according to the label instructions.    If you develop any new or worsening symptoms, including persistent vomiting not controlled with medication, fever greater than 101, severe or worsening abdominal pain, or other symptoms that concern you, please return immediately to the Emergency Department. 

## 2020-07-23 ENCOUNTER — Other Ambulatory Visit
Admission: RE | Admit: 2020-07-23 | Discharge: 2020-07-23 | Disposition: A | Discharge status: Home / Self Care | Payer: Medicare Other | Source: Ambulatory Visit | Attending: Gastroenterology | Admitting: Gastroenterology

## 2020-07-23 DIAGNOSIS — R197 Diarrhea, unspecified: Secondary | ICD-10-CM | POA: Diagnosis present

## 2020-07-23 DIAGNOSIS — Z8619 Personal history of other infectious and parasitic diseases: Secondary | ICD-10-CM | POA: Diagnosis present

## 2020-07-23 LAB — GASTROINTESTINAL PANEL BY PCR, STOOL (REPLACES STOOL CULTURE)

## 2020-07-23 LAB — C DIFFICILE QUICK SCREEN W PCR REFLEX
C Diff antigen: NEGATIVE
C Diff interpretation: NOT DETECTED
C Diff toxin: NEGATIVE

## 2020-07-26 LAB — CALPROTECTIN, FECAL: Calprotectin, Fecal: 111 ug/g (ref 0–120)

## 2020-07-27 LAB — PANCREATIC ELASTASE, FECAL: Pancreatic Elastase-1, Stool: >500 ug Elast./g (ref >200)

## 2020-08-17 DIAGNOSIS — E785 Hyperlipidemia, unspecified: Secondary | ICD-10-CM | POA: Diagnosis not present

## 2020-08-17 DIAGNOSIS — E039 Hypothyroidism, unspecified: Secondary | ICD-10-CM | POA: Diagnosis not present

## 2020-08-17 DIAGNOSIS — R7301 Impaired fasting glucose: Secondary | ICD-10-CM | POA: Diagnosis not present

## 2020-08-17 DIAGNOSIS — E876 Hypokalemia: Secondary | ICD-10-CM | POA: Diagnosis not present

## 2020-08-17 DIAGNOSIS — I10 Essential (primary) hypertension: Secondary | ICD-10-CM | POA: Diagnosis not present

## 2020-08-22 DIAGNOSIS — E785 Hyperlipidemia, unspecified: Secondary | ICD-10-CM | POA: Diagnosis not present

## 2020-08-22 DIAGNOSIS — K219 Gastro-esophageal reflux disease without esophagitis: Secondary | ICD-10-CM | POA: Diagnosis not present

## 2020-08-22 DIAGNOSIS — E559 Vitamin D deficiency, unspecified: Secondary | ICD-10-CM | POA: Diagnosis not present

## 2020-08-22 DIAGNOSIS — R609 Edema, unspecified: Secondary | ICD-10-CM | POA: Diagnosis not present

## 2020-08-23 DIAGNOSIS — Z79899 Other long term (current) drug therapy: Secondary | ICD-10-CM | POA: Diagnosis not present

## 2020-08-23 DIAGNOSIS — G8929 Other chronic pain: Secondary | ICD-10-CM | POA: Diagnosis not present

## 2020-09-09 ENCOUNTER — Other Ambulatory Visit: Payer: Self-pay

## 2020-09-09 ENCOUNTER — Encounter: Payer: Self-pay | Admitting: Emergency Medicine

## 2020-09-09 ENCOUNTER — Emergency Department
Admission: EM | Admit: 2020-09-09 | Discharge: 2020-09-09 | Disposition: A | Discharge status: Home / Self Care | Payer: Medicare Other | Attending: Student in an Organized Health Care Education/Training Program | Admitting: Student in an Organized Health Care Education/Training Program

## 2020-09-09 DIAGNOSIS — E039 Hypothyroidism, unspecified: Secondary | ICD-10-CM | POA: Insufficient documentation

## 2020-09-09 DIAGNOSIS — Z79899 Other long term (current) drug therapy: Secondary | ICD-10-CM | POA: Insufficient documentation

## 2020-09-09 DIAGNOSIS — I130 Hypertensive heart and chronic kidney disease with heart failure and stage 1 through stage 4 chronic kidney disease, or unspecified chronic kidney disease: Secondary | ICD-10-CM | POA: Diagnosis not present

## 2020-09-09 DIAGNOSIS — I5030 Unspecified diastolic (congestive) heart failure: Secondary | ICD-10-CM | POA: Insufficient documentation

## 2020-09-09 DIAGNOSIS — N189 Chronic kidney disease, unspecified: Secondary | ICD-10-CM | POA: Diagnosis not present

## 2020-09-09 DIAGNOSIS — J029 Acute pharyngitis, unspecified: Secondary | ICD-10-CM | POA: Insufficient documentation

## 2020-09-09 DIAGNOSIS — B349 Viral infection, unspecified: Secondary | ICD-10-CM

## 2020-09-09 DIAGNOSIS — J45909 Unspecified asthma, uncomplicated: Secondary | ICD-10-CM | POA: Diagnosis not present

## 2020-09-09 DIAGNOSIS — U071 COVID-19: Secondary | ICD-10-CM | POA: Diagnosis not present

## 2020-09-09 DIAGNOSIS — Z87891 Personal history of nicotine dependence: Secondary | ICD-10-CM | POA: Insufficient documentation

## 2020-09-09 DIAGNOSIS — B9789 Other viral agents as the cause of diseases classified elsewhere: Secondary | ICD-10-CM | POA: Diagnosis not present

## 2020-09-09 DIAGNOSIS — Z96651 Presence of right artificial knee joint: Secondary | ICD-10-CM | POA: Diagnosis not present

## 2020-09-09 DIAGNOSIS — Z8616 Personal history of COVID-19: Secondary | ICD-10-CM

## 2020-09-09 DIAGNOSIS — R509 Fever, unspecified: Secondary | ICD-10-CM | POA: Diagnosis present

## 2020-09-09 HISTORY — DX: Personal history of COVID-19: Z86.16

## 2020-09-09 LAB — GROUP A STREP BY PCR: Group A Strep by PCR: NOT DETECTED

## 2020-09-09 MED ORDER — KETOROLAC TROMETHAMINE 60 MG/2ML IM SOLN
60.0000 mg | Freq: Once | INTRAMUSCULAR | Status: AC
  Administered 2020-09-09: 60 mg via INTRAMUSCULAR
  Filled 2020-09-09: qty 2

## 2020-09-09 NOTE — ED Triage Notes (Signed)
Pt comes into the ED via [place] exposure and now having sore throat, body aches, and fever x3 days.  Pt has even and unlabored respirations and in NAD.

## 2020-09-09 NOTE — ED Provider Notes (Signed)
[facility] Emergency Department Provider Note   ____________________________________________   Event Date/Time   First MD Initiated Contact with Patient 09/09/20 1747     (approximate)  I have reviewed the triage vital signs and the nursing notes.   HISTORY  Chief Complaint Fever and Covid Exposure    HPI Alexis Christensen is a 50 y.o. female patient presents with sore throat, body aches, and fever for 3 days.  Patient stated recent exposure to COVID-19.  Patient states she has taken the COVID-19 vaccine.  Last doses were given in April 2021.  Patient is not taking a booster.         Past Medical History:  Diagnosis Date  . Adopted   . Anemia   . Anxiety   . Arthritis   . Asthma   . Bipolar 1 disorder ([place])   . Bipolar disorder ([place])   . Carpal tunnel syndrome 09/15/2014  . CHF (congestive heart failure) (HCC)    Dialostic CHF  . Chronic diarrhea 09/07/2015  . Chronic kidney disease    H/O KIDNEY STONES  . Chronic venous insufficiency 04/26/2016  . Depression   . DVT (deep venous thrombosis) ([place]) 2016   RIGHT LEG  . Edema leg   . Effusion of knee 12/30/2013  . Family history of adverse reaction to anesthesia    ADOPTED  . GERD (gastroesophageal reflux disease)   . Goiter   . 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 Headache(784.0)    MIGRAINES  . Heart burn   . Heart murmur   . History of kidney stones   . HLD (hyperlipidemia)   . Hx MRSA infection   . Hypothyroidism   . Lipoma of arm 05/11/2013  . Lower extremity edema   . Lymphedema 04/26/2016  . Post traumatic stress disorder (PTSD)   . PVD (peripheral vascular disease) ([place])   . Seasonal allergies   . Stress incontinence   . Thyroid disease   . Urge incontinence     Patient Active Problem List   Diagnosis Date Noted  . Bilateral inguinal hernia without obstruction or gangrene   . Redness of eye, left 08/22/2016  . Iron deficiency anemia 07/11/2016  . ANA positive  06/13/2016  . Bilateral hand pain 06/13/2016  . Lymphedema 04/26/2016  . Chronic venous insufficiency 04/26/2016  . Swelling of limb 04/26/2016  . Chronic diarrhea 09/07/2015  . Fever 09/07/2015  . Hypothyroidism 09/07/2015  . Hypovitaminosis D 09/07/2015  . Obesity 09/07/2015  . Weight loss, unintentional 09/07/2015  . Bipolar mixed affective disorder, moderate ([place]) 09/05/2015  . SUI (stress urinary incontinence, female) 05/18/2015  . Pain in shoulder 09/15/2014  . Carpal tunnel syndrome 09/15/2014  . Chronic left shoulder pain 09/15/2014  . Effusion of knee 12/30/2013  . 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 Gonalgia 12/30/2013  . Lipoma of arm 05/11/2013    Past Surgical History:  Procedure Laterality Date  . ABDOMINAL HYSTERECTOMY    . ANKLE SURGERY    . CHOLECYSTECTOMY    . COLONOSCOPY WITH PROPOFOL N/A 06/24/2019   Procedure: COLONOSCOPY WITH PROPOFOL;  Surgeon: Toledo, Benay Pike, MD;  Location: [facility] ENDOSCOPY;  Service: Gastroenterology;  Laterality: N/A;  . CYSTOSCOPY N/A 04/09/2016   Procedure: CYSTOSCOPY;  Surgeon: Bjorn Loser, MD;  Location: [facility] ORS;  Service: Urology;  Laterality: N/A;  . DILATION AND CURETTAGE OF UTERUS    . ESOPHAGOGASTRODUODENOSCOPY (EGD) WITH PROPOFOL N/A 06/24/2019   Procedure: ESOPHAGOGASTRODUODENOSCOPY (EGD) WITH PROPOFOL;  Surgeon:  Toledo, Benay Pike, MD;  Location: [facility] ENDOSCOPY;  Service: Gastroenterology;  Laterality: N/A;  . INGUINAL HERNIA REPAIR Bilateral 04/18/2017   Procedure: LAPAROSCOPIC BILATERAL INGUINAL HERNIA REPAIR;  Surgeon: Jules Husbands, MD;  Location: [facility] ORS;  Service: General;  Laterality: Bilateral;  . INTERSTIM IMPLANT PLACEMENT N/A 11/26/2016   Procedure: Barrie Lyme IMPLANT FIRST STAGE;  Surgeon: Bjorn Loser, MD;  Location: [facility] ORS;  Service: Urology;  Laterality: N/A;  . INTERSTIM IMPLANT PLACEMENT N/A 11/26/2016   Procedure: Barrie Lyme IMPLANT SECOND STAGE;  Surgeon: Bjorn Loser, MD;  Location: [facility]  ORS;  Service: Urology;  Laterality: N/A;  . JOINT REPLACEMENT Right    knee  . KNEE ARTHROSCOPY Bilateral   . KNEE ARTHROSCOPY WITH LATERAL RELEASE Left 10/18/2015   Procedure: KNEE ARTHROSCOPY LATERAL AND PARTIAL SYNOVECTOMY;  Surgeon: Hessie Knows, MD;  Location: [facility] ORS;  Service: Orthopedics;  Laterality: Left;  . Lymph Node removal  2015   Neck  . PUBOVAGINAL SLING N/A 04/09/2016   Procedure: PUBO-VAGINAL SLING/ RETROPUBIC SLING;  Surgeon: Bjorn Loser, MD;  Location: [facility] ORS;  Service: Urology;  Laterality: N/A;  . REPLACEMENT TOTAL KNEE Right   . SHOULDER SURGERY Right 2014  . TUBAL LIGATION    . WRIST SURGERY Right    metal plate    Prior to Admission medications   Medication Sig Start Date End Date Taking? Authorizing Provider  amitriptyline (ELAVIL) 50 MG tablet Take 50 mg by mouth at bedtime.    [provider]  ARIPiprazole (ABILIFY MAINTENA IM) Inject 400 mg into the muscle every 30 (thirty) days.    [provider]  baclofen (LIORESAL) 10 MG tablet Take 10 mg by mouth 2 (two) times daily.    [provider]  clobetasol cream (TEMOVATE) 4.09 % Apply 1 application topically 2 (two) times daily as needed (for rash).     [provider]  colestipol (COLESTID) 1 g tablet Take 2 g by mouth daily at 12 noon.     [provider]  cyclobenzaprine (FLEXERIL) 10 MG tablet Take 10 mg by mouth 2 (two) times daily as needed for muscle spasms.  03/29/17   [provider]  diclofenac sodium (VOLTAREN) 1 % GEL Apply 2 g topically 4 (four) times daily. 12/23/17   Laban Emperor, PA-C  DULoxetine (CYMBALTA) 60 MG capsule Take 60 mg by mouth at bedtime.     [provider]  EPINEPHrine 0.3 mg/0.3 mL IJ SOAJ injection Inject 0.3 mg into the muscle as needed for anaphylaxis.    [provider]  fluticasone (FLONASE) 50 MCG/ACT nasal spray Place 2 sprays into both nostrils daily.     [provider]  furosemide  (LASIX) 20 MG tablet Take 20 mg by mouth 2 (two) times daily. 02/19/19   [provider]  gabapentin (NEURONTIN) 600 MG tablet Take 600 mg by mouth 2 (two) times daily.     [provider]  hydrOXYzine (ATARAX/VISTARIL) 25 MG tablet Take 50 mg by mouth every 8 (eight) hours as needed for itching.    [provider]  levocetirizine (XYZAL) 5 MG tablet Take 5 mg by mouth every evening.    [provider]  levothyroxine (SYNTHROID, LEVOTHROID) 150 MCG tablet Take 150 mcg by mouth daily before breakfast.  02/24/16   [provider]  metolazone (ZAROXOLYN) 2.5 MG tablet Take 2.5 mg by mouth 2 (two) times daily.    [provider]  Olopatadine HCl (PATADAY) 0.2 % SOLN Apply to eye.  [provider]  omeprazole (PRILOSEC) 20 MG capsule Take 20 mg by mouth daily.    [provider]  ondansetron (ZOFRAN ODT) 4 MG disintegrating tablet Take 1 tablet (4 mg total) by mouth every 8 (eight) hours as needed for nausea or vomiting. Patient not taking: Reported on 05/14/2019 01/25/18   Paulette Blanch, MD  ondansetron (ZOFRAN ODT) 4 MG disintegrating tablet Take 1 tablet (4 mg total) by mouth every 8 (eight) hours as needed for nausea or vomiting. 07/10/20   Naaman Plummer, MD  potassium chloride SA (KLOR-CON) 20 MEQ tablet Take 20 mEq by mouth 2 (two) times daily.    [provider]  prazosin (MINIPRESS) 2 MG capsule Take 2 mg by mouth at bedtime.    [provider]  ranitidine (ZANTAC) 150 MG tablet Take 150 mg by mouth 2 (two) times daily.    [provider]  rizatriptan (MAXALT) 5 MG tablet Take 5 mg by mouth as needed for migraine. May repeat in 2 hours if needed    [provider]  rosuvastatin (CRESTOR) 40 MG tablet Take 40 mg by mouth daily.    [provider]  spironolactone (ALDACTONE) 25 MG tablet Take 12.5 mg by mouth daily.    [provider]  SUBOXONE 8-2 MG FILM Dissolve 1 film  under tongue twice daily 09/14/16   [provider]    Allergies Geodon [ziprasidone hydrochloride], Sulfacetamide sodium, Ziprasidone, Ziprasidone hcl, Ace inhibitors, Erythromycin, Hydromorphone, Lamictal [lamotrigine], Strawberry (diagnostic), and Sulfa antibiotics  Family History  Adopted: Yes  Family history unknown: Yes    Social History Social History   Tobacco Use  . Smoking status: Former Smoker    Packs/day: 0.25    Years: 20.00    Pack years: 5.00    Types: Cigarettes    Quit date: 11/13/2012    Years since quitting: 7.8  . Smokeless tobacco: Former Systems developer  . Tobacco comment: STRESS RELATED  Vaping Use  . Vaping Use: Never used  Substance Use Topics  . Alcohol use: Not Currently    Alcohol/week: 0.0 standard drinks    Comment: occasional  . Drug use: No    Review of Systems Constitutional: No fever/chills Eyes: No visual changes. ENT: No sore throat. Cardiovascular: Denies chest pain. Respiratory: Denies shortness of breath. Gastrointestinal: No abdominal pain.  No nausea, no vomiting.  No diarrhea.  No constipation. Genitourinary: Negative for dysuria. Musculoskeletal: Negative for back pain. Skin: Negative for rash. Neurological: Negative for headaches, focal weakness or numbness. Psychiatric:  Bipolar Endocrine:  Hypothyroidism Hematological/Lymphatic:  Iron deficiency anemia Allergic/Immunilogical: Geodon, sulfa, ACE inhibitor, erythromycin, hydromorphone, Lamictal, and strawberries. ____________________________________________   PHYSICAL EXAM:  VITAL SIGNS: ED Triage Vitals [09/09/20 1740]  Enc Vitals Group     BP 112/68     Pulse Rate 84     Resp 17     Temp 99.8 F (37.7 C)     Temp Source Oral     SpO2 96 %     Weight 230 lb (104.3 kg)     Height 5\' 4"  (1.626 m)     Head Circumference      Peak Flow      Pain Score 7     Pain Loc      Pain Edu?      Excl. in [place]?     Constitutional: Alert and oriented. Well appearing and in  no acute distress. Eyes: Conjunctivae are normal. PERRL. EOMI. Head: Atraumatic. Nose: No  congestion/rhinnorhea. Mouth/Throat: Mucous membranes are moist.  Oropharynx erythematous.  No visible exudate. Neck: No stridor.  No cervical spine tenderness to palpation. Hematological/Lymphatic/Immunilogical: No cervical lymphadenopathy. Cardiovascular: Normal rate, regular rhythm. Grossly normal heart sounds.  Good peripheral circulation. Respiratory: Normal respiratory effort.  No retractions. Lungs CTAB. Gastrointestinal: Soft and nontender. No distention. No abdominal bruits. No CVA tenderness. Genitourinary: Deferred Musculoskeletal: No lower extremity tenderness nor edema.  No joint effusions. Neurologic:  Normal speech and language. No gross focal neurologic deficits are appreciated. No gait instability. Skin:  Skin is warm, dry and intact. No rash noted. Psychiatric: Mood and affect are normal. Speech and behavior are normal.  ____________________________________________   LABS (all labs ordered are listed, but only abnormal results are displayed)  Labs Reviewed  GROUP A STREP BY PCR  SARS CORONAVIRUS 2 (TAT 6-24 HRS)   ____________________________________________  EKG   ____________________________________________  RADIOLOGY Cecilio Asper, personally viewed and evaluated these images (plain radiographs) as part of my medical decision making, as well as reviewing the written report by the radiologist.  ED MD interpretation:    Official radiology report(s): No results found.  ____________________________________________   PROCEDURES  Procedure(s) performed (including Critical Care):  Procedures   ____________________________________________   INITIAL IMPRESSION / ASSESSMENT AND PLAN / ED COURSE  As part of my medical decision making, I reviewed the following data within the [place]         Patient presents with 3 days of sore throat  body ache and fever.  Patient states exposed to COVID-19.  Patient is vaccinated.  Discussed negative strep results test with patient.  Advised patient COVID-19 test results can be viewed later in the MyChart app.  Patient advised to self quarantine till she received results.  If test is positive was quarantine per CDC recommendations.  Advise over-the-counter ibuprofen as needed for fever/pain.      ____________________________________________   FINAL CLINICAL IMPRESSION(S) / ED DIAGNOSES  Final diagnoses:  Viral pharyngitis  Viral illness     ED Discharge Orders    None      *Please note:  Alexis Christensen was evaluated in Emergency Department on 09/09/2020 for the symptoms described in the history of present illness. She was evaluated in the context of the global COVID-19 pandemic, which necessitated consideration that the patient might be at risk for infection with the SARS-CoV-2 virus that causes COVID-19. Institutional protocols and algorithms that pertain to the evaluation of patients at risk for COVID-19 are in a state of rapid change based on information released by regulatory bodies including the CDC and federal and state organizations. These policies and algorithms were followed during the patient's care in the ED.  Some ED evaluations and interventions may be delayed as a result of limited staffing during and the pandemic.*   Note:  This document was prepared using Dragon voice recognition software and may include unintentional dictation errors.    Sable Feil, PA-C 09/09/20 1910    Merlyn Lot, MD 09/09/20 2041

## 2020-09-09 NOTE — Discharge Instructions (Addendum)
Your test was negative for strep pharyngitis.  COVID-19 test is pending.  You may reviewed the results later today in the MyChart app.  Advised self quarantine pending results of COVID-19 test.  If test is positive quarantine per CDC recommendations.  If test is negative recommend taking the booster shot.  Follow discharge care instructions for viral pharyngitis.

## 2020-09-10 LAB — SARS CORONAVIRUS 2 (TAT 6-24 HRS): SARS Coronavirus 2: POSITIVE — AB

## 2020-09-11 ENCOUNTER — Telehealth: Payer: Self-pay | Admitting: Family

## 2020-09-11 ENCOUNTER — Ambulatory Visit (HOSPITAL_COMMUNITY): Payer: Medicare Other

## 2020-09-11 ENCOUNTER — Other Ambulatory Visit: Payer: Self-pay | Admitting: Family

## 2020-09-11 DIAGNOSIS — U071 COVID-19: Secondary | ICD-10-CM

## 2020-09-11 DIAGNOSIS — E785 Hyperlipidemia, unspecified: Secondary | ICD-10-CM

## 2020-09-11 DIAGNOSIS — Z609 Problem related to social environment, unspecified: Secondary | ICD-10-CM

## 2020-09-11 NOTE — Progress Notes (Signed)
I connected by phone with Alexis Christensen on 09/11/2020 at 11:28 AM to discuss the potential use of a new treatment for mild to moderate COVID-19 viral infection in non-hospitalized patients.  This patient is a 50 y.o. female that meets the FDA criteria for Emergency Use Authorization of COVID monoclonal antibody sotrovimab.  Has a (+) direct SARS-CoV-2 viral test result  Has mild or moderate COVID-19   Is NOT hospitalized due to COVID-19  Is within 10 days of symptom onset  Has at least one of the high risk factor(s) for progression to severe COVID-19 and/or hospitalization as defined in EUA.  Specific high risk criteria : BMI > 25, Cardiovascular disease or hypertension and Other high risk medical condition per CDC:  Elevated SVI score   I have spoken and communicated the following to the patient or parent/caregiver regarding COVID monoclonal antibody treatment:  1. FDA has authorized the emergency use for the treatment of mild to moderate COVID-19 in adults and pediatric patients with positive results of direct SARS-CoV-2 viral testing who are 32 years of age and older weighing at least 40 kg, and who are at high risk for progressing to severe COVID-19 and/or hospitalization.  2. The significant known and potential risks and benefits of COVID monoclonal antibody, and the extent to which such potential risks and benefits are unknown.  3. Information on available alternative treatments and the risks and benefits of those alternatives, including clinical trials.  4. Patients treated with COVID monoclonal antibody should continue to self-isolate and use infection control measures (e.g., wear mask, isolate, social distance, avoid sharing personal items, clean and disinfect "high touch" surfaces, and frequent handwashing) according to CDC guidelines.   5. The patient or parent/caregiver has the option to accept or refuse COVID monoclonal antibody treatment.  After reviewing this information  with the patient, the patient has agreed to receive one of the available covid 19 monoclonal antibodies and will be provided an appropriate fact sheet prior to infusion.   Mauricio Po, FNP 09/11/2020 11:28 AM

## 2020-09-11 NOTE — Telephone Encounter (Signed)
Returned call received from Ms. Catala. Symptoms started on 2/23. Continues to have body aches, headaches, and sore throat. She is vaccinated and has not received booster. Discussed the risks, benefits, and potential financial costs associated with Sotrovimab treatment and she wishes to continue.  Hello Jadah Smitty Cords,   We contacted you because you were recently diagnosed with COVID-19 and may benefit from a new treatment for mild to moderate disease. This treatment helps reduce the chance of being hospitalized. For some patients with medical conditions that may increase the chances of an infection, the treatment also decreases the risk for serious symptoms related to COVID-19.   The Food and Drug Administration (FDA) approved emergency use of a new drug to treat patients with mild to moderate symptoms who have risk factors that could cause severe symptoms related to COVID-19. This new treatment is a monoclonal antibody. It works by attaching like a magnet to the SARS-CoV2 virus (the virus that causes COVID-19) and stops it from infecting more cells in your body. It does not kill the virus, but it prevents it from spreading throughout your body with the hope that it will decrease your symptoms after it is administered.   This new drug is an intravenous (IV) infusion called Sotrovimab that is given over one 30-minute session in our [facility] outpatient infusion clinic. You will need to stay about 60 minutes after the infusion to ensure you are tolerating it well and to watch for any allergic reaction to the medication. More information will be given to you at the time of your appointment.   Important information:  . The potential side effects: 2-4% of recipients experience nausea, vomiting, diarrhea, dizziness, headaches, itching, worsening fevers or chills for around 24 hours. . There have been no serious infusion-related reactions. . Of the more than 3,000 patients who received the infusion, only one  had an allergic response that ended once the infusion was stopped. This is why we monitor all of our patients closely for 60 minutes after the infusion.  . The COVID-19 vaccine (including boosters) no longer needs to be delayed after this infusion and can be safely administered after you have completed your isolation period.  . The medication itself is free, but your insurance will be charged an infusion fee. The amount you may owe later varies from insurance to insurance. If you do not have insurance, we can put you in touch with our billing department. Please contact your insurance agent to discuss prior to your appointment if you would like further details about billing specific to your policy. The CMS code is: M25  . If you have been tested outside of a [facility], you MUST bring a copy of your positive test with you the morning of your appointment. You may take a photo of this and upload to your MyChart portal,  have the testing facility fax the result to [phone] or email a copy to [email or web address].    You have been scheduled to receive the monoclonal antibody therapy at [facility]:  09/12/20 at 11:30am   The address for the infusion clinic site is:   --The GPS address is [address], and the parking is located near the [employer] building where you will see a "COVID19 Infusion" feather banner marking the entrance to parking. (See photos below.)            --Enter into the 2nd entrance where the "wave, flag banner" is at the road. Turn into this  2nd entrance and immediately turn left or right to park in one of the marked spaces.   --Please stay in your car and call the desk for assistance inside at [phone]. Let us know which space you are in.    --The average time in the department is roughly 90 minutes to two hours for monoclonal treatment. This includes preparation of the medication, IV start and the required 60-minute monitoring after the  infusion.     Should you develop worsening shortness of breath, chest pain or severe breathing problems, please do not wait for this appointment and go to the emergency room for evaluation and treatment instead. You will undergo another oxygen screen before your infusion to ensure this is the best treatment option for you. There is a chance that the best decision may be to send you to the emergency room for evaluation at the time of your appointment.    The day of your visit, you should: Marland Kitchen Get plenty of rest the night before and drink plenty of water. . Eat a light meal/snack before coming and take your medications as prescribed.  . Wear warm, comfortable clothes with a shirt that can roll-up over the elbow (will need IV start).  . Wear a mask.  . Consider bringing an activity to help pass the time.  Terri Piedra, NP 09/11/2020 11:28 AM

## 2020-09-11 NOTE — Telephone Encounter (Signed)
Called to discuss with patient about COVID-19 symptoms and the use of one of the available treatments for those with mild to moderate Covid symptoms and at a high risk of hospitalization.  Pt appears to qualify for outpatient treatment due to co-morbid conditions and/or a member of an at-risk group in accordance with the FDA Emergency Use Authorization.    Symptom onset: 09/06/20 Vaccinated: Yes Booster? No Immunocompromised? No Qualifiers: BMI 39, hyperlipidemia, elevated SVI score  Alexis Christensen was seen at the [facility] ED on 2/25 with sore throat, body aches, and fever x 3 days. She is vaccinated per chart without booster.  Unable to reach Ms. Bruss via phone and left a voicemail with call back number and MyChart message.   Terri Piedra, NP 09/11/2020 11:07 AM

## 2020-09-12 ENCOUNTER — Ambulatory Visit (HOSPITAL_COMMUNITY)
Admission: RE | Admit: 2020-09-12 | Discharge: 2020-09-12 | Disposition: A | Discharge status: Home / Self Care | Payer: Medicare Other | Source: Ambulatory Visit | Attending: Pulmonary Disease | Admitting: Pulmonary Disease

## 2020-09-12 DIAGNOSIS — Z6839 Body mass index (BMI) 39.0-39.9, adult: Secondary | ICD-10-CM | POA: Insufficient documentation

## 2020-09-12 DIAGNOSIS — Z609 Problem related to social environment, unspecified: Secondary | ICD-10-CM | POA: Diagnosis not present

## 2020-09-12 DIAGNOSIS — E785 Hyperlipidemia, unspecified: Secondary | ICD-10-CM

## 2020-09-12 DIAGNOSIS — U071 COVID-19: Secondary | ICD-10-CM | POA: Diagnosis not present

## 2020-09-12 MED ORDER — SOTROVIMAB 500 MG/8ML IV SOLN
500.0000 mg | Freq: Once | INTRAVENOUS | Status: AC
  Administered 2020-09-12: 500 mg via INTRAVENOUS

## 2020-09-12 MED ORDER — ALBUTEROL SULFATE HFA 108 (90 BASE) MCG/ACT IN AERS: 2.0000 | INHALATION_SPRAY | Freq: Once | RESPIRATORY_TRACT | Status: DC | PRN

## 2020-09-12 MED ORDER — EPINEPHRINE 0.3 MG/0.3ML IJ SOAJ: 0.3000 mg | Freq: Once | INTRAMUSCULAR | Status: DC | PRN

## 2020-09-12 MED ORDER — FAMOTIDINE IN NACL 20-0.9 MG/50ML-% IV SOLN: 20.0000 mg | Freq: Once | INTRAVENOUS | Status: DC | PRN

## 2020-09-12 MED ORDER — METHYLPREDNISOLONE SODIUM SUCC 125 MG IJ SOLR: 125.0000 mg | Freq: Once | INTRAMUSCULAR | Status: DC | PRN

## 2020-09-12 MED ORDER — SODIUM CHLORIDE 0.9 % IV SOLN: INTRAVENOUS | Status: DC | PRN

## 2020-09-12 MED ORDER — DIPHENHYDRAMINE HCL 50 MG/ML IJ SOLN: 50.0000 mg | Freq: Once | INTRAMUSCULAR | Status: DC | PRN

## 2020-09-12 NOTE — Progress Notes (Signed)
Patient reviewed Fact Sheet for Patients, Parents, and Caregivers for Emergency Use Authorization (EUA) of sotrovimab for the Treatment of Coronavirus. Patient also reviewed and is agreeable to the estimated cost of treatment. Patient is agreeable to proceed.   

## 2020-09-12 NOTE — Discharge Instructions (Signed)

## 2020-09-12 NOTE — Progress Notes (Signed)
Diagnosis: COVID-19  Physician: Dr. Patrick Wright  Procedure: Covid Infusion Clinic Med: Sotrovimab infusion - Provided patient with sotrovimab fact sheet for patients, parents, and caregivers prior to infusion.   Complications: No immediate complications noted  Discharge: Discharged home    

## 2020-09-20 DIAGNOSIS — Z79899 Other long term (current) drug therapy: Secondary | ICD-10-CM | POA: Diagnosis not present

## 2020-09-20 DIAGNOSIS — F17200 Nicotine dependence, unspecified, uncomplicated: Secondary | ICD-10-CM | POA: Diagnosis not present

## 2020-09-20 DIAGNOSIS — G8929 Other chronic pain: Secondary | ICD-10-CM | POA: Diagnosis not present

## 2020-09-23 DIAGNOSIS — R932 Abnormal findings on diagnostic imaging of liver and biliary tract: Secondary | ICD-10-CM | POA: Diagnosis not present

## 2020-10-04 DIAGNOSIS — Z79899 Other long term (current) drug therapy: Secondary | ICD-10-CM | POA: Diagnosis not present

## 2020-10-04 DIAGNOSIS — F17200 Nicotine dependence, unspecified, uncomplicated: Secondary | ICD-10-CM | POA: Diagnosis not present

## 2020-10-18 DIAGNOSIS — Z79899 Other long term (current) drug therapy: Secondary | ICD-10-CM | POA: Diagnosis not present

## 2020-10-18 DIAGNOSIS — G8929 Other chronic pain: Secondary | ICD-10-CM | POA: Diagnosis not present

## 2020-10-18 DIAGNOSIS — F17200 Nicotine dependence, unspecified, uncomplicated: Secondary | ICD-10-CM | POA: Diagnosis not present

## 2020-11-08 DIAGNOSIS — F17200 Nicotine dependence, unspecified, uncomplicated: Secondary | ICD-10-CM | POA: Diagnosis not present

## 2020-11-15 DIAGNOSIS — G8929 Other chronic pain: Secondary | ICD-10-CM | POA: Diagnosis not present

## 2020-11-15 DIAGNOSIS — Z79899 Other long term (current) drug therapy: Secondary | ICD-10-CM | POA: Diagnosis not present

## 2020-11-23 DIAGNOSIS — E538 Deficiency of other specified B group vitamins: Secondary | ICD-10-CM | POA: Diagnosis not present

## 2020-11-23 DIAGNOSIS — I10 Essential (primary) hypertension: Secondary | ICD-10-CM | POA: Diagnosis not present

## 2020-11-23 DIAGNOSIS — E785 Hyperlipidemia, unspecified: Secondary | ICD-10-CM | POA: Diagnosis not present

## 2020-11-23 DIAGNOSIS — R5383 Other fatigue: Secondary | ICD-10-CM | POA: Diagnosis not present

## 2020-11-23 DIAGNOSIS — E039 Hypothyroidism, unspecified: Secondary | ICD-10-CM | POA: Diagnosis not present

## 2020-11-23 DIAGNOSIS — R7303 Prediabetes: Secondary | ICD-10-CM | POA: Diagnosis not present

## 2020-11-28 DIAGNOSIS — E785 Hyperlipidemia, unspecified: Secondary | ICD-10-CM | POA: Diagnosis not present

## 2020-11-28 DIAGNOSIS — R7303 Prediabetes: Secondary | ICD-10-CM | POA: Diagnosis not present

## 2020-11-28 DIAGNOSIS — K219 Gastro-esophageal reflux disease without esophagitis: Secondary | ICD-10-CM | POA: Diagnosis not present

## 2020-11-29 DIAGNOSIS — R7302 Impaired glucose tolerance (oral): Secondary | ICD-10-CM | POA: Insufficient documentation

## 2020-11-29 DIAGNOSIS — Z79899 Other long term (current) drug therapy: Secondary | ICD-10-CM | POA: Diagnosis not present

## 2020-11-29 DIAGNOSIS — F17200 Nicotine dependence, unspecified, uncomplicated: Secondary | ICD-10-CM | POA: Diagnosis not present

## 2020-12-05 ENCOUNTER — Ambulatory Visit (INDEPENDENT_AMBULATORY_CARE_PROVIDER_SITE_OTHER): Payer: Medicare Other | Admitting: Urology

## 2020-12-05 ENCOUNTER — Other Ambulatory Visit: Payer: Self-pay

## 2020-12-05 VITALS — BP 124/82 | HR 65 | Ht 64.0 in | Wt 230.0 lb

## 2020-12-05 DIAGNOSIS — N3946 Mixed incontinence: Secondary | ICD-10-CM | POA: Diagnosis not present

## 2020-12-05 DIAGNOSIS — N393 Stress incontinence (female) (male): Secondary | ICD-10-CM | POA: Diagnosis not present

## 2020-12-05 NOTE — Progress Notes (Signed)
12/05/2020 3:50 PM   Alexis Christensen Oct 23, 1970 [id number]  Referring provider: Perrin Maltese, MD [place],  [place] [address]  No chief complaint on file.   HPI: The patient had InterStim on 11/26/2016 for persistent overactive bladder and urgency incontinence after sling.The patient no longer has stress incontinence and had failed multiple medications. The need for the InterStim was discussed pre-operatively.  She is continent with intermittent urgency. She used to have mild bedwetting and does not have it either. Frequency also improved.  The patient came in today with a 3-week history of what she described initially as left upper quadrant pain but on further evaluation I believe it was a bit lower almost into her left lower quadrant. It is daily. It is present for 24 hours/day but waxes and wanes in severity. She describes a negative gastrointestinal visit, CT scan and urine culture.She had a CT scan stone protocol October 13, 2017 that was normal. Lab test normal. I did not see the urine culture in the medical record.  She stays quite dry during the day. She gets up a few times at night. If she holds it too long she can has some foot on the floor drawn but the device appears to be working very well  On physical examination there is no tenderness over the device on the left side. All incisions look normal.  I did not think the presentation was due to the InterStim device in the urine culture was negative on this day   Almost completely continent and very pleased.  No blood or infections.  Day Patient has not been seen for a few years.  Frequency stable And was doing great until 2 months ago.  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  High-volume leakage.  No cystitis symptoms.  She has never changed the program or amplitude.    PMH: Past Medical History:  Diagnosis Date  . Adopted   . Anemia   . Anxiety   . Arthritis   . Asthma    . Bipolar 1 disorder ([facility])   . Bipolar disorder ([facility] [place])   . Carpal tunnel syndrome 09/15/2014  . CHF (congestive heart failure) ([facility])    Dialostic CHF  . Chronic diarrhea 09/07/2015  . Chronic kidney disease    H/O KIDNEY STONES  . Chronic venous insufficiency 04/26/2016  . Depression   . DVT (deep venous thrombosis) ([facility]) 2016   RIGHT LEG  . Edema leg   . Effusion of knee 12/30/2013  . Family history of adverse reaction to anesthesia    ADOPTED  . GERD (gastroesophageal reflux disease)   . Goiter   . 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 Headache(784.0)    MIGRAINES  . Heart burn   . Heart murmur   . History of kidney stones   . HLD (hyperlipidemia)   . Hx MRSA infection   . Hypothyroidism   . Lipoma of arm 05/11/2013  . Lower extremity edema   . Lymphedema 04/26/2016  . Post traumatic stress disorder (PTSD)   . PVD (peripheral vascular disease) ([facility])   . Seasonal allergies   . Stress incontinence   . Thyroid disease   . Urge incontinence     Surgical History: Past Surgical History:  Procedure Laterality Date  . ABDOMINAL HYSTERECTOMY    . ANKLE SURGERY    . CHOLECYSTECTOMY    . COLONOSCOPY WITH PROPOFOL N/A 06/24/2019   Procedure: COLONOSCOPY WITH PROPOFOL;  Surgeon: Cairo,  Benay Pike, MD;  Location: [facility];  Service: Gastroenterology;  Laterality: N/A;  . CYSTOSCOPY N/A 04/09/2016   Procedure: CYSTOSCOPY;  Surgeon: Bjorn Loser, MD;  Location: [facility];  Service: Urology;  Laterality: N/A;  . DILATION AND CURETTAGE OF UTERUS    . ESOPHAGOGASTRODUODENOSCOPY (EGD) WITH PROPOFOL N/A 06/24/2019   Procedure: ESOPHAGOGASTRODUODENOSCOPY (EGD) WITH PROPOFOL;  Surgeon: [place], Benay Pike, MD;  Location: [facility];  Service: Gastroenterology;  Laterality: N/A;  . INGUINAL HERNIA REPAIR Bilateral 04/18/2017   Procedure: LAPAROSCOPIC BILATERAL INGUINAL HERNIA REPAIR;  Surgeon: Jules Husbands, MD;  Location: [facility];  Service: General;  Laterality: Bilateral;  .  INTERSTIM IMPLANT PLACEMENT N/A 11/26/2016   Procedure: Barrie Lyme IMPLANT FIRST STAGE;  Surgeon: Bjorn Loser, MD;  Location: [facility];  Service: Urology;  Laterality: N/A;  . INTERSTIM IMPLANT PLACEMENT N/A 11/26/2016   Procedure: Barrie Lyme IMPLANT SECOND STAGE;  Surgeon: Bjorn Loser, MD;  Location: [facility];  Service: Urology;  Laterality: N/A;  . JOINT REPLACEMENT Right    knee  . KNEE ARTHROSCOPY Bilateral   . KNEE ARTHROSCOPY WITH LATERAL RELEASE Left 10/18/2015   Procedure: KNEE ARTHROSCOPY LATERAL AND PARTIAL SYNOVECTOMY;  Surgeon: Hessie Knows, MD;  Location: [facility];  Service: Orthopedics;  Laterality: Left;  . Lymph Node removal  2015   Neck  . PUBOVAGINAL SLING N/A 04/09/2016   Procedure: PUBO-VAGINAL SLING/ RETROPUBIC SLING;  Surgeon: Bjorn Loser, MD;  Location: [facility];  Service: Urology;  Laterality: N/A;  . REPLACEMENT TOTAL KNEE Right   . SHOULDER SURGERY Right 2014  . TUBAL LIGATION    . WRIST SURGERY Right    metal plate    Home Medications:  Allergies as of 12/05/2020      Reactions   Geodon [ziprasidone Hydrochloride] Other (See Comments)   Numbness ,sob, headaches, blurred vision   Sulfacetamide Sodium Hives   Ziprasidone Anaphylaxis   Other reaction(s): Other (See Comments) Numbness ,sob, headaches, blurred vision Other reaction(s): Other (See Comments), Unknown Numbness ,sob, headaches, blurred vision Numbness ,sob, headaches, blurred vision Other reaction(s): Other (See Comments), Unknown Numbness ,sob, headaches, blurred vision Numbness ,sob, headaches, blurred vision Numbness ,sob, headaches, blurred vision Other reaction(s): Other (See Comments), Unknown Numbness ,sob, headaches, blurred vision Numbness ,sob, headaches, blurred vision Other reaction(s): Other (See Comments), Unknown Numbness ,sob, headaches, blurred vision Numbness ,sob, headaches, blurred vision   Ziprasidone Hcl Anaphylaxis, Other (See Comments)   Other reaction(s):  Other (See Comments), Unknown Numbness ,sob, headaches, blurred vision Numbness ,sob, headaches, blurred vision   Ace Inhibitors Rash   Erythromycin Rash, Hives   Hydromorphone Rash   Lamictal [lamotrigine] Rash   Other reaction(s): Unknown   Strawberry (diagnostic) Rash   Sulfa Antibiotics Hives, Rash   Other reaction(s): Unknown Other reaction(s): Unknown Other reaction(s): Unknown      Medication List       Accurate as of Dec 05, 2020  3:50 PM. If you have any questions, ask your nurse or doctor.        ABILIFY MAINTENA IM Inject 400 mg into the muscle every 30 (thirty) days.   amitriptyline 50 MG tablet Commonly known as: ELAVIL Take 50 mg by mouth at bedtime.   baclofen 10 MG tablet Commonly known as: LIORESAL Take 10 mg by mouth 2 (two) times daily.   clobetasol cream 0.05 % Commonly known as: TEMOVATE Apply 1 application topically 2 (two) times daily as needed (for rash).   colestipol 1 g tablet Commonly known as: COLESTID Take 2 g by  mouth daily at 12 noon.   cyclobenzaprine 10 MG tablet Commonly known as: FLEXERIL Take 10 mg by mouth 2 (two) times daily as needed for muscle spasms.   diclofenac sodium 1 % Gel Commonly known as: Voltaren Apply 2 g topically 4 (four) times daily.   DULoxetine 60 MG capsule Commonly known as: CYMBALTA Take 60 mg by mouth at bedtime.   EPINEPHrine 0.3 mg/0.3 mL Soaj injection Commonly known as: EPI-PEN Inject 0.3 mg into the muscle as needed for anaphylaxis.   fluticasone 50 MCG/ACT nasal spray Commonly known as: FLONASE Place 2 sprays into both nostrils daily.   furosemide 20 MG tablet Commonly known as: LASIX Take 20 mg by mouth 2 (two) times daily.   gabapentin 600 MG tablet Commonly known as: NEURONTIN Take 600 mg by mouth 2 (two) times daily.   hydrOXYzine 25 MG tablet Commonly known as: ATARAX/VISTARIL Take 50 mg by mouth every 8 (eight) hours as needed for itching.   levocetirizine 5 MG  tablet Commonly known as: XYZAL Take 5 mg by mouth every evening.   levothyroxine 150 MCG tablet Commonly known as: SYNTHROID Take 150 mcg by mouth daily before breakfast.   metolazone 2.5 MG tablet Commonly known as: ZAROXOLYN Take 2.5 mg by mouth 2 (two) times daily.   omeprazole 20 MG capsule Commonly known as: PRILOSEC Take 20 mg by mouth daily.   ondansetron 4 MG disintegrating tablet Commonly known as: Zofran ODT Take 1 tablet (4 mg total) by mouth every 8 (eight) hours as needed for nausea or vomiting.   ondansetron 4 MG disintegrating tablet Commonly known as: Zofran ODT Take 1 tablet (4 mg total) by mouth every 8 (eight) hours as needed for nausea or vomiting.   Pataday 0.2 % Soln Generic drug: Olopatadine HCl Apply to eye.   potassium chloride SA 20 MEQ tablet Commonly known as: KLOR-CON Take 20 mEq by mouth 2 (two) times daily.   prazosin 2 MG capsule Commonly known as: MINIPRESS Take 2 mg by mouth at bedtime.   ranitidine 150 MG tablet Commonly known as: ZANTAC Take 150 mg by mouth 2 (two) times daily.   rizatriptan 5 MG tablet Commonly known as: MAXALT Take 5 mg by mouth as needed for migraine. May repeat in 2 hours if needed   rosuvastatin 40 MG tablet Commonly known as: CRESTOR Take 40 mg by mouth daily.   spironolactone 25 MG tablet Commonly known as: ALDACTONE Take 12.5 mg by mouth daily.   Suboxone 8-2 MG Film Generic drug: Buprenorphine HCl-Naloxone HCl Dissolve 1 film under tongue twice daily       Allergies:  Allergies  Allergen Reactions  . Geodon [Ziprasidone Hydrochloride] Other (See Comments)    Numbness ,sob, headaches, blurred vision  . Sulfacetamide Sodium Hives  . Ziprasidone Anaphylaxis    Other reaction(s): Other (See Comments) Numbness ,sob, headaches, blurred vision  Other reaction(s): Other (See Comments), Unknown Numbness ,sob, headaches, blurred vision Numbness ,sob, headaches, blurred vision  Other  reaction(s): Other (See Comments), Unknown Numbness ,sob, headaches, blurred vision Numbness ,sob, headaches, blurred vision   Numbness ,sob, headaches, blurred vision Other reaction(s): Other (See Comments), Unknown Numbness ,sob, headaches, blurred vision Numbness ,sob, headaches, blurred vision Other reaction(s): Other (See Comments), Unknown Numbness ,sob, headaches, blurred vision Numbness ,sob, headaches, blurred vision  . Ziprasidone Hcl Anaphylaxis and Other (See Comments)    Other reaction(s): Other (See Comments), Unknown Numbness ,sob, headaches, blurred vision Numbness ,sob, headaches, blurred vision  . Ace Inhibitors Rash  .  Erythromycin Rash and Hives  . Hydromorphone Rash  . Lamictal [Lamotrigine] Rash    Other reaction(s): Unknown  . Strawberry (Diagnostic) Rash  . Sulfa Antibiotics Hives and Rash    Other reaction(s): Unknown Other reaction(s): Unknown Other reaction(s): Unknown    Family History: Family History  Adopted: Yes  Family history unknown: Yes    Social History:  reports that she quit smoking about 8 years ago. Her smoking use included cigarettes. She has a 5.00 pack-year smoking history. She has quit using smokeless tobacco. She reports previous alcohol use. She reports that she does not use drugs.  ROS:                                        Physical Exam: There were no vitals taken for this visit.   Laboratory Data: Lab Results  Component Value Date   WBC 8.5 07/18/2020   HGB 12.3 07/18/2020   HCT 38.1 07/18/2020   MCV 91.1 07/18/2020   PLT 275 07/18/2020    Lab Results  Component Value Date   CREATININE 0.54 07/18/2020    No results found for: PSA  No results found for: TESTOSTERONE  No results found for: HGBA1C  Urinalysis    Component Value Date/Time   COLORURINE YELLOW (A) 07/18/2020 2233   APPEARANCEUR HAZY (A) 07/18/2020 2233   APPEARANCEUR Cloudy (A) 10/21/2017 1029   LABSPEC 1.028  07/18/2020 2233   PHURINE 6.0 07/18/2020 2233   GLUCOSEU NEGATIVE 07/18/2020 2233   HGBUR NEGATIVE 07/18/2020 2233   BILIRUBINUR NEGATIVE 07/18/2020 2233   BILIRUBINUR Negative 10/21/2017 [place] 07/18/2020 2233   PROTEINUR NEGATIVE 07/18/2020 2233   UROBILINOGEN 1.0 06/18/2011 1258   NITRITE NEGATIVE 07/18/2020 2233   LEUKOCYTESUR NEGATIVE 07/18/2020 2233    Pertinent Imaging:   Assessment & Plan: Patient has a new onset urge incontinence.  During troubleshooting that was only 1 program.  Impedance was normal.  It was turned up amplitude and she felt it in the vagina.  Judson Roch will speak to the representative and start up programming exercise and look into its data  There are no diagnoses linked to this encounter.  No follow-ups on file.  Reece Packer, MD  [facility] [address], [place], [place] [address] [phone]

## 2020-12-05 NOTE — Progress Notes (Signed)
Interstim Management   Patient is present today for interstim check. Patient complains of symptoms incontinence, symptoms started 2 months Impedance check was done and there is no Impedance noted Battery life was noted to be 27-48 month Patient is currently on Program # 3 setting 0.9, patient is not currently feeling sensation Program was not changed she was kept on Program #3 setting 1.1, patient is feeling sensation in vaginal area  Patient should keep a voiding dairy for 1 week and return for nurse visit to reassess and see if symptoms improve or if more adjustments need to be made. Patient's device does not have any other programs currently loaded and no personal information came up when her devise synced with ours. Will contact the rep to trouble shoot and see if a program load needs to be done or if further evaluation of her devise is required.

## 2020-12-06 LAB — MICROSCOPIC EXAMINATION: Epithelial Cells (non renal): >10 /hpf — AB (ref 0–10)

## 2020-12-06 LAB — URINALYSIS, COMPLETE
Bilirubin, UA: NEGATIVE
Glucose, UA: NEGATIVE
Ketones, UA: NEGATIVE
Nitrite, UA: NEGATIVE
Specific Gravity, UA: >1.03 — ABNORMAL HIGH (ref 1.005–1.030)
Urobilinogen, Ur: 1 mg/dL (ref 0.2–1.0)
pH, UA: 5.5 (ref 5.0–7.5)

## 2020-12-08 LAB — CULTURE, URINE COMPREHENSIVE

## 2020-12-13 DIAGNOSIS — F17200 Nicotine dependence, unspecified, uncomplicated: Secondary | ICD-10-CM | POA: Diagnosis not present

## 2020-12-13 DIAGNOSIS — G8929 Other chronic pain: Secondary | ICD-10-CM | POA: Diagnosis not present

## 2020-12-13 DIAGNOSIS — Z79899 Other long term (current) drug therapy: Secondary | ICD-10-CM | POA: Diagnosis not present

## 2020-12-16 ENCOUNTER — Ambulatory Visit (INDEPENDENT_AMBULATORY_CARE_PROVIDER_SITE_OTHER): Payer: Medicare Other

## 2020-12-16 ENCOUNTER — Other Ambulatory Visit: Payer: Self-pay

## 2020-12-16 ENCOUNTER — Encounter: Payer: Self-pay | Admitting: Oncology

## 2020-12-16 DIAGNOSIS — N3946 Mixed incontinence: Secondary | ICD-10-CM

## 2020-12-16 NOTE — Progress Notes (Signed)
Interstim Management   Patient is present today for interstim check. Patient complains of symptoms incontinence recheck after changing amp at last visit over a week ago. Patient states she is doing much better with the increased amp and has had almost no accidents since the change has been made. Voiding dairy today shows decreased frequency/urgency and only 1-2 accidents   Patient is currently on Program # 3 setting 1.1, patient is feeling sensation in vaginal/ perineal area Settings checked on Program #4 sensation found at setting 0.5  Close to the rectum Settings check on Program #2 sensation found at setting 0.7 close to the rectum Settings checked on Program #1 sensation found at setting 0.5 in the vaginal area Program was changed to Program #1 setting 0.5 and will stay here for 1 week to see how her symptoms maintain on this program. Since her amp setting is lower on this program it was changed to see if she does well and if so will keep going forward to extend battery life. Patient agrees with this plan. Demonstration of program change was given to the patient today along with amp adjustment.   Patient should keep a voiding dairy for 1 week and will call with an update on symptoms. At that time will reassess and advise over the phone if adjustments need to be made.

## 2020-12-22 ENCOUNTER — Telehealth: Payer: Self-pay

## 2020-12-22 NOTE — Telephone Encounter (Signed)
Patient called to follow up on her symptoms after interstim program change. She states she is doing well, no episodes of incontinence and one day of frequency noted. Otherwise she is doing well and would like to continue on the current program #1 setting 0.5. She was scheduled for a 28mo follow up w/Dr. Matilde Sprang for symptom recheck and will call if she has any issues in the interm.

## 2020-12-27 DIAGNOSIS — Z79899 Other long term (current) drug therapy: Secondary | ICD-10-CM | POA: Diagnosis not present

## 2021-01-02 ENCOUNTER — Other Ambulatory Visit: Payer: Self-pay | Admitting: Nurse Practitioner

## 2021-01-02 DIAGNOSIS — E039 Hypothyroidism, unspecified: Secondary | ICD-10-CM | POA: Diagnosis not present

## 2021-01-02 DIAGNOSIS — R609 Edema, unspecified: Secondary | ICD-10-CM | POA: Diagnosis not present

## 2021-01-02 DIAGNOSIS — R7303 Prediabetes: Secondary | ICD-10-CM | POA: Diagnosis not present

## 2021-01-02 DIAGNOSIS — Z1231 Encounter for screening mammogram for malignant neoplasm of breast: Secondary | ICD-10-CM

## 2021-01-02 DIAGNOSIS — E785 Hyperlipidemia, unspecified: Secondary | ICD-10-CM | POA: Diagnosis not present

## 2021-01-10 DIAGNOSIS — Z79899 Other long term (current) drug therapy: Secondary | ICD-10-CM | POA: Diagnosis not present

## 2021-01-10 DIAGNOSIS — G8929 Other chronic pain: Secondary | ICD-10-CM | POA: Diagnosis not present

## 2021-01-10 DIAGNOSIS — F17200 Nicotine dependence, unspecified, uncomplicated: Secondary | ICD-10-CM | POA: Diagnosis not present

## 2021-01-11 DIAGNOSIS — H6503 Acute serous otitis media, bilateral: Secondary | ICD-10-CM | POA: Diagnosis not present

## 2021-01-11 DIAGNOSIS — R109 Unspecified abdominal pain: Secondary | ICD-10-CM | POA: Diagnosis not present

## 2021-01-11 DIAGNOSIS — R1084 Generalized abdominal pain: Secondary | ICD-10-CM | POA: Diagnosis not present

## 2021-01-11 DIAGNOSIS — H9203 Otalgia, bilateral: Secondary | ICD-10-CM | POA: Diagnosis not present

## 2021-01-11 DIAGNOSIS — N390 Urinary tract infection, site not specified: Secondary | ICD-10-CM | POA: Diagnosis not present

## 2021-01-12 ENCOUNTER — Emergency Department: Payer: Medicare Other

## 2021-01-12 ENCOUNTER — Encounter: Payer: Self-pay | Admitting: Oncology

## 2021-01-12 ENCOUNTER — Emergency Department
Admission: EM | Admit: 2021-01-12 | Discharge: 2021-01-12 | Disposition: A | Discharge status: Home / Self Care | Payer: Medicare Other | Attending: Emergency Medicine | Admitting: Emergency Medicine

## 2021-01-12 ENCOUNTER — Encounter: Payer: Self-pay | Admitting: Emergency Medicine

## 2021-01-12 ENCOUNTER — Other Ambulatory Visit: Payer: Self-pay

## 2021-01-12 DIAGNOSIS — J45909 Unspecified asthma, uncomplicated: Secondary | ICD-10-CM | POA: Insufficient documentation

## 2021-01-12 DIAGNOSIS — Q283 Other malformations of cerebral vessels: Secondary | ICD-10-CM | POA: Diagnosis not present

## 2021-01-12 DIAGNOSIS — I5032 Chronic diastolic (congestive) heart failure: Secondary | ICD-10-CM | POA: Diagnosis not present

## 2021-01-12 DIAGNOSIS — M79605 Pain in left leg: Secondary | ICD-10-CM | POA: Diagnosis not present

## 2021-01-12 DIAGNOSIS — M79602 Pain in left arm: Secondary | ICD-10-CM | POA: Diagnosis not present

## 2021-01-12 DIAGNOSIS — Z79899 Other long term (current) drug therapy: Secondary | ICD-10-CM | POA: Insufficient documentation

## 2021-01-12 DIAGNOSIS — U071 COVID-19: Secondary | ICD-10-CM | POA: Diagnosis not present

## 2021-01-12 DIAGNOSIS — Z96651 Presence of right artificial knee joint: Secondary | ICD-10-CM | POA: Insufficient documentation

## 2021-01-12 DIAGNOSIS — R29818 Other symptoms and signs involving the nervous system: Secondary | ICD-10-CM | POA: Insufficient documentation

## 2021-01-12 DIAGNOSIS — E039 Hypothyroidism, unspecified: Secondary | ICD-10-CM | POA: Diagnosis not present

## 2021-01-12 DIAGNOSIS — M79622 Pain in left upper arm: Secondary | ICD-10-CM | POA: Diagnosis present

## 2021-01-12 DIAGNOSIS — N189 Chronic kidney disease, unspecified: Secondary | ICD-10-CM | POA: Diagnosis not present

## 2021-01-12 DIAGNOSIS — I639 Cerebral infarction, unspecified: Secondary | ICD-10-CM | POA: Diagnosis not present

## 2021-01-12 DIAGNOSIS — R0602 Shortness of breath: Secondary | ICD-10-CM | POA: Diagnosis not present

## 2021-01-12 LAB — RESP PANEL BY RT-PCR (FLU A&B, COVID) ARPGX2
Influenza A by PCR: NEGATIVE
Influenza B by PCR: NEGATIVE
SARS Coronavirus 2 by RT PCR: POSITIVE — AB

## 2021-01-12 LAB — CBC
HCT: 34.1 % — ABNORMAL LOW (ref 36.0–46.0)
Hemoglobin: 11.3 g/dL — ABNORMAL LOW (ref 12.0–15.0)
MCH: 30.3 pg (ref 26.0–34.0)
MCHC: 33.1 g/dL (ref 30.0–36.0)
MCV: 91.4 fL (ref 80.0–100.0)
Platelets: 234 10*3/uL (ref 150–400)
RBC: 3.73 MIL/uL — ABNORMAL LOW (ref 3.87–5.11)
RDW: 12.2 % (ref 11.5–15.5)
WBC: 5.8 10*3/uL (ref 4.0–10.5)
nRBC: 0 % (ref 0.0–0.2)

## 2021-01-12 LAB — TSH: TSH: 22.424 u[IU]/mL — ABNORMAL HIGH (ref 0.350–4.500)

## 2021-01-12 LAB — BASIC METABOLIC PANEL
Anion gap: 7 (ref 5–15)
BUN: 19 mg/dL (ref 6–20)
CO2: 28 mmol/L (ref 22–32)
Calcium: 8.5 mg/dL — ABNORMAL LOW (ref 8.9–10.3)
Chloride: 102 mmol/L (ref 98–111)
Creatinine, Ser: 0.81 mg/dL (ref 0.44–1.00)
GFR, Estimated: >60 mL/min (ref >60)
Glucose, Bld: 109 mg/dL — ABNORMAL HIGH (ref 70–99)
Potassium: 3.7 mmol/L (ref 3.5–5.1)
Sodium: 137 mmol/L (ref 135–145)

## 2021-01-12 LAB — D-DIMER, QUANTITATIVE: D-Dimer, Quant: 0.39 ug/mL-FEU (ref 0.00–0.50)

## 2021-01-12 LAB — T4, FREE: Free T4: 0.74 ng/dL (ref 0.61–1.12)

## 2021-01-12 LAB — TROPONIN I (HIGH SENSITIVITY)
Troponin I (High Sensitivity): 2 ng/L (ref <18)
Troponin I (High Sensitivity): 3 ng/L (ref <18)

## 2021-01-12 LAB — CK: Total CK: 106 U/L (ref 38–234)

## 2021-01-12 MED ORDER — ACETAMINOPHEN 500 MG PO TABS
1000.0000 mg | ORAL_TABLET | Freq: Once | ORAL | Status: AC
  Administered 2021-01-12: 1000 mg via ORAL
  Filled 2021-01-12: qty 2

## 2021-01-12 MED ORDER — KETOROLAC TROMETHAMINE 30 MG/ML IJ SOLN
15.0000 mg | Freq: Once | INTRAMUSCULAR | Status: AC
  Administered 2021-01-12: 15 mg via INTRAVENOUS
  Filled 2021-01-12: qty 1

## 2021-01-12 MED ORDER — IOHEXOL 350 MG/ML SOLN
75.0000 mL | Freq: Once | INTRAVENOUS | Status: AC | PRN
  Administered 2021-01-12: 75 mL via INTRAVENOUS

## 2021-01-12 MED ORDER — LIDOCAINE 5 % EX PTCH
1.0000 | MEDICATED_PATCH | CUTANEOUS | Status: DC
  Administered 2021-01-12: 1 via TRANSDERMAL
  Filled 2021-01-12: qty 1

## 2021-01-12 NOTE — ED Provider Notes (Addendum)
[facility] Emergency Department Provider Note  ____________________________________________   Event Date/Time   First MD Initiated Contact with Patient 01/12/21 1926     (approximate)  I have reviewed the triage vital signs and the nursing notes.   HISTORY  Chief Complaint Left-sided pain   HPI Alexis Christensen is a 50 y.o. female with bipolar, CHF, prior DVT, thyroid disease who comes in with left-sided pain.  Patient reports continuous pain from her shoulder down into her hand constant, worse with movement, better at rest.  States that she tries to open up a water bottle she feels like she cannot do it secondary to the pain.  She also reports some intermittent shooting pains in the front of her left leg.  She denies any swelling of the leg, calf tenderness.  She denies this ever happening previously.  She denies any chest pain.  She denies any changes in her medications.          Past Medical History:  Diagnosis Date   Adopted    Anemia    Anxiety    Arthritis    Asthma    Bipolar 1 disorder ([place])    Bipolar disorder ([place])    Carpal tunnel syndrome 09/15/2014   CHF (congestive heart failure) (HCC)    Dialostic CHF   Chronic diarrhea 09/07/2015   Chronic kidney disease    H/O KIDNEY STONES   Chronic venous insufficiency 04/26/2016   Depression    DVT (deep venous thrombosis) ([place]) 2016   RIGHT LEG   Edema leg    Effusion of knee 12/30/2013   Family history of adverse reaction to anesthesia    ADOPTED   GERD (gastroesophageal reflux disease)    Goiter    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   Headache(784.0)    MIGRAINES   Heart burn    Heart murmur    History of kidney stones    HLD (hyperlipidemia)    Hx MRSA infection    Hypothyroidism    Lipoma of arm 05/11/2013   Lower extremity edema    Lymphedema 04/26/2016   Post traumatic stress disorder (PTSD)    PVD (peripheral vascular disease) (HCC)    Seasonal allergies    Stress  incontinence    Thyroid disease    Urge incontinence     Patient Active Problem List   Diagnosis Date Noted   Bilateral inguinal hernia without obstruction or gangrene    Redness of eye, left 08/22/2016   Iron deficiency anemia 07/11/2016   ANA positive 06/13/2016   Bilateral hand pain 06/13/2016   Lymphedema 04/26/2016   Chronic venous insufficiency 04/26/2016   Swelling of limb 04/26/2016   Chronic diarrhea 09/07/2015   Fever 09/07/2015   Hypothyroidism 09/07/2015   Hypovitaminosis D 09/07/2015   Obesity 09/07/2015   Weight loss, unintentional 09/07/2015   Bipolar mixed affective disorder, moderate ([place]) 09/05/2015   SUI (stress urinary incontinence, female) 05/18/2015   Pain in shoulder 09/15/2014   Carpal tunnel syndrome 09/15/2014   Chronic left shoulder pain 09/15/2014   Effusion of knee 12/30/2013   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   Gonalgia 12/30/2013   Lipoma of arm 05/11/2013    Past Surgical History:  Procedure Laterality Date   ABDOMINAL HYSTERECTOMY     ANKLE SURGERY     CHOLECYSTECTOMY     COLONOSCOPY WITH PROPOFOL N/A 06/24/2019   Procedure: COLONOSCOPY WITH PROPOFOL;  Surgeon: Toledo, Benay Pike, MD;  Location: [facility] ENDOSCOPY;  Service:  Gastroenterology;  Laterality: N/A;   CYSTOSCOPY N/A 04/09/2016   Procedure: CYSTOSCOPY;  Surgeon: Bjorn Loser, MD;  Location: [facility] ORS;  Service: Urology;  Laterality: N/A;   DILATION AND CURETTAGE OF UTERUS     ESOPHAGOGASTRODUODENOSCOPY (EGD) WITH PROPOFOL N/A 06/24/2019   Procedure: ESOPHAGOGASTRODUODENOSCOPY (EGD) WITH PROPOFOL;  Surgeon: Toledo, Benay Pike, MD;  Location: [facility] ENDOSCOPY;  Service: Gastroenterology;  Laterality: N/A;   INGUINAL HERNIA REPAIR Bilateral 04/18/2017   Procedure: LAPAROSCOPIC BILATERAL INGUINAL HERNIA REPAIR;  Surgeon: Jules Husbands, MD;  Location: [facility] ORS;  Service: General;  Laterality: Bilateral;   INTERSTIM IMPLANT PLACEMENT N/A 11/26/2016   Procedure: Barrie Lyme IMPLANT FIRST  STAGE;  Surgeon: Bjorn Loser, MD;  Location: [facility] ORS;  Service: Urology;  Laterality: N/A;   INTERSTIM IMPLANT PLACEMENT N/A 11/26/2016   Procedure: Barrie Lyme IMPLANT SECOND STAGE;  Surgeon: Bjorn Loser, MD;  Location: [facility] ORS;  Service: Urology;  Laterality: N/A;   JOINT REPLACEMENT Right    knee   KNEE ARTHROSCOPY Bilateral    KNEE ARTHROSCOPY WITH LATERAL RELEASE Left 10/18/2015   Procedure: KNEE ARTHROSCOPY LATERAL AND PARTIAL SYNOVECTOMY;  Surgeon: Hessie Knows, MD;  Location: [facility] ORS;  Service: Orthopedics;  Laterality: Left;   Lymph Node removal  2015   Neck   PUBOVAGINAL SLING N/A 04/09/2016   Procedure: PUBO-VAGINAL SLING/ RETROPUBIC SLING;  Surgeon: Bjorn Loser, MD;  Location: [facility] ORS;  Service: Urology;  Laterality: N/A;   REPLACEMENT TOTAL KNEE Right    SHOULDER SURGERY Right 2014   TUBAL LIGATION     WRIST SURGERY Right    metal plate    Prior to Admission medications   Medication Sig Start Date End Date Taking? Authorizing Provider  ABILIFY MAINTENA 400 MG PRSY prefilled syringe SMARTSIG:1 Each IM Once a Month 11/28/20   [provider]  amitriptyline (ELAVIL) 75 MG tablet Take 75 mg by mouth at bedtime. 11/19/20   [provider]  baclofen (LIORESAL) 10 MG tablet Take 10 mg by mouth 2 (two) times daily.    [provider]  clobetasol cream (TEMOVATE) 3.66 % Apply 1 application topically 2 (two) times daily as needed (for rash).     [provider]  colestipol (COLESTID) 1 g tablet Take 2 g by mouth daily at 12 noon.     [provider]  cyclobenzaprine (FLEXERIL) 10 MG tablet Take 10 mg by mouth 2 (two) times daily as needed for muscle spasms.  03/29/17   [provider]  diclofenac sodium (VOLTAREN) 1 % GEL Apply 2 g topically 4 (four) times daily. 12/23/17   Laban Emperor, PA-C  dicyclomine (BENTYL) 10 MG capsule Take 20 mg by mouth every 8 (eight) hours as needed. 11/19/20   [provider]   DULoxetine (CYMBALTA) 60 MG capsule Take 60 mg by mouth at bedtime.     [provider]  EPINEPHrine 0.3 mg/0.3 mL IJ SOAJ injection Inject 0.3 mg into the muscle as needed for anaphylaxis.    [provider]  fluticasone (FLONASE) 50 MCG/ACT nasal spray Place 2 sprays into both nostrils daily.     [provider]  furosemide (LASIX) 20 MG tablet Take 20 mg by mouth 2 (two) times daily. 02/19/19   [provider]  gabapentin (NEURONTIN) 600 MG tablet Take 600 mg by mouth 2 (two) times daily.     [provider]  hydrOXYzine (ATARAX/VISTARIL) 25 MG tablet Take 50 mg by mouth every 8 (eight) hours as needed for itching.  [provider]  levocetirizine (XYZAL) 5 MG tablet Take 5 mg by mouth every evening.    [provider]  levothyroxine (SYNTHROID) 300 MCG tablet Take by mouth. 10/02/18   [provider]  metolazone (ZAROXOLYN) 2.5 MG tablet Take 2.5 mg by mouth 2 (two) times daily.    [provider]  Olopatadine HCl 0.2 % SOLN Apply to eye.    [provider]  omeprazole (PRILOSEC) 20 MG capsule Take 20 mg by mouth daily.    [provider]  ondansetron (ZOFRAN ODT) 4 MG disintegrating tablet Take 1 tablet (4 mg total) by mouth every 8 (eight) hours as needed for nausea or vomiting. 01/25/18   Paulette Blanch, MD  ondansetron (ZOFRAN ODT) 4 MG disintegrating tablet Take 1 tablet (4 mg total) by mouth every 8 (eight) hours as needed for nausea or vomiting. 07/10/20   Naaman Plummer, MD  pantoprazole (PROTONIX) 20 MG tablet Take 20 mg by mouth daily. 11/18/20   [provider]  potassium chloride SA (KLOR-CON) 20 MEQ tablet Take 20 mEq by mouth 2 (two) times daily.    [provider]  prazosin (MINIPRESS) 2 MG capsule Take 2 mg by mouth at bedtime.    [provider]  ranitidine (ZANTAC) 150 MG tablet Take 150 mg by mouth 2 (two) times daily.    [provider]   rizatriptan (MAXALT) 5 MG tablet Take 5 mg by mouth as needed for migraine. May repeat in 2 hours if needed    [provider]  rosuvastatin (CRESTOR) 40 MG tablet Take 40 mg by mouth daily.    [provider]  simvastatin (ZOCOR) 20 MG tablet  05/04/20   [provider]  simvastatin (ZOCOR) 40 MG tablet Take 40 mg by mouth at bedtime. 10/29/20   [provider]  spironolactone (ALDACTONE) 25 MG tablet Take 12.5 mg by mouth daily.    [provider]  SUBOXONE 8-2 MG FILM Dissolve 1 film under tongue twice daily 09/14/16   [provider]    Allergies Geodon [ziprasidone hydrochloride], Sulfacetamide sodium, Ziprasidone, Ziprasidone hcl, Ace inhibitors, Erythromycin, Hydromorphone, Lamictal [lamotrigine], Strawberry (diagnostic), and Sulfa antibiotics  Family History  Adopted: Yes  Family history unknown: Yes    Social History Social History   Tobacco Use   Smoking status: Former    Packs/day: 0.25    Years: 20.00    Pack years: 5.00    Types: Cigarettes    Quit date: 11/13/2012    Years since quitting: 8.1   Smokeless tobacco: Former   Tobacco comments:    STRESS RELATED  Vaping Use   Vaping Use: Never used  Substance Use Topics   Alcohol use: Not Currently    Alcohol/week: 0.0 standard drinks    Comment: occasional   Drug use: No      Review of Systems Constitutional: No fever/chills Eyes: No visual changes. ENT: No sore throat. Cardiovascular: Denies chest pain. Respiratory: Denies shortness of breath. Gastrointestinal: No abdominal pain.  No nausea, no vomiting.  No diarrhea.  No constipation. Genitourinary: Negative for dysuria. Musculoskeletal: Left arm and left leg pain Skin: Negative for rash. Neurological: Negative for headaches, focal weakness or numbness. All other ROS negative ____________________________________________   PHYSICAL EXAM:  VITAL SIGNS: ED Triage Vitals  Enc Vitals Group     BP  01/12/21 1815 105/72     Pulse Rate 01/12/21 1815 63     Resp 01/12/21 1815 18  Temp 01/12/21 1815 98.6 F (37 C)     Temp Source 01/12/21 1815 Oral     SpO2 01/12/21 1815 97 %     Weight 01/12/21 1821 230 lb (104.3 kg)     Height 01/12/21 1821 5\' 4"  (1.626 m)     Head Circumference --      Peak Flow --      Pain Score 01/12/21 1820 7     Pain Loc --      Pain Edu? --      Excl. in [place]? --     Constitutional: Alert and oriented. Well appearing and in no acute distress. Eyes: Conjunctivae are normal. EOMI. Head: Atraumatic. Nose: No congestion/rhinnorhea. Mouth/Throat: Mucous membranes are moist.   TM are clear  Neck: No stridor. Trachea Midline. FROM Cardiovascular: Normal rate, regular rhythm. Grossly normal heart sounds.  Good peripheral circulation. Respiratory: Normal respiratory effort.  No retractions. Lungs CTAB. Gastrointestinal: Soft and nontender. No distention. No abdominal bruits.  Musculoskeletal: No lower extremity tenderness nor edema.  No joint effusions.  2+ distal pulses DP and radial.  Patient has full range of motion of her left shoulder.  She does report some pain when pushing on her arm.  She also reports pain to light touch onto her leg.  No swelling of the leg.  No calf tenderness.  No redness or warmth of any of the joints or skin. Neurologic:  Normal speech and language.  Cranial nerves appear intact.  Equal strength in arms and legs.  No leg drift.  No pronator drift.  Sensation intact throughout. Skin:  Skin is warm, dry and intact. No rash noted. Psychiatric: Mood and affect are normal. Speech and behavior are normal. GU: Deferred   ____________________________________________   LABS (all labs ordered are listed, but only abnormal results are displayed)  Labs Reviewed  BASIC METABOLIC PANEL - Abnormal; Notable for the following components:      Result Value   Glucose, Bld 109 (*)    Calcium 8.5 (*)    All other components within normal limits   CBC - Abnormal; Notable for the following components:   RBC 3.73 (*)    Hemoglobin 11.3 (*)    HCT 34.1 (*)    All other components within normal limits  POC URINE PREG, ED  TROPONIN I (HIGH SENSITIVITY)   ____________________________________________   ED ECG REPORT I, Vanessa , the attending physician, personally viewed and interpreted this ECG.  Normal sinus rate 61, no ST elevation, no T wave inversions, normal intervals ____________________________________________  RADIOLOGY Robert Bellow, personally viewed and evaluated these images (plain radiographs) as part of my medical decision making, as well as reviewing the written report by the radiologist.  ED MD interpretation: No pneumonia  Official radiology report(s): DG Chest 2 View  Result Date: 01/12/2021 CLINICAL DATA:  Left arm pain, shortness of breath EXAM: CHEST - 2 VIEW COMPARISON:  None. FINDINGS: The heart size and mediastinal contours are within normal limits. Both lungs are clear. The visualized skeletal structures are unremarkable. IMPRESSION: Normal study. Electronically Signed   By: Rolm Baptise M.D.   On: 01/12/2021 19:04    ____________________________________________   PROCEDURES  Procedure(s) performed (including Critical Care):  Procedures   ____________________________________________   INITIAL IMPRESSION / ASSESSMENT AND PLAN / ED COURSE  Alexis Christensen was evaluated in Emergency Department on 01/12/2021 for the symptoms described in the history of present illness. She was evaluated in the context of the  global COVID-19 pandemic, which necessitated consideration that the patient might be at risk for infection with the SARS-CoV-2 virus that causes COVID-19. Institutional protocols and algorithms that pertain to the evaluation of patients at risk for COVID-19 are in a state of rapid change based on information released by regulatory bodies including the CDC and federal and state organizations.  These policies and algorithms were followed during the patient's care in the ED.    Patient is a 50 year old comes in with left arm pain and some intermittent left leg pain.  On my neuro exam she appears to be neuro intact without any's weakness.  She is no pronator drift.  No leg drift.  Sensation is intact throughout.  Is little strange is only on the left side.  I consider the possibility of a PE, dissection, blood clots but D-dimer was negative, chest x-ray without evidence of widened mediastinu she m.  Labs ordered evaluate for Electra abnormalities, AKI, rhabdo, thyroid dysfunction.  She denies any recent change to her medications to suggest this is a cause although she was recently started on ciprofloxacin for an ear infection so not sure if that could be causing patient's symptoms.  She denies any falls to suggest fractures and there is no bruising or swelling noted on exam  9:57 PM work-up is reassuring.  No evidence of PE, DVT and I have low suspicion given no swelling.  She reports the pain in her left leg is not always there currently she denies any pain.  She reports the pain in her arm is constant.  She really does not feel weak to me.  She is unable to get MRI due to bladder stimulator my suspicion for stroke is low.  I think be reasonable to progress with a CTA to make sure no evidence of vessel narrowing that would put her at high risk for stroke given unable to get MRI.  Patient CTA was negative.  The thoracic aorta was normal and vessels all appeared normal.  She had a positive COVID test.  Denies having COVID in the last 3 months Did have it Feb 2022.  She does report just this ear infection has been going on for the past few days.  I suspect that her red ears were from COVID and not a true ear infection.  On my examination do not see any signs of an ear infection today.  I discussed her talking to PCP about dc the cipro due to these new symptoms.  We discussed paxlovid treatment benefits  and risk but at this time she has declined.  We discussed return to the ER if she develops worsening weakness in the arm but again at this time I do not see any weakness and patient's unable to get MRI she feels comfortable with discharge home will continue taking Tylenol ibuprofen to help with pain and to return to the ER if her symptoms are worsening  I discussed the provisional nature of ED diagnosis, the treatment so far, the ongoing plan of care, follow up appointments and return precautions with the patient and any family or support people present. They expressed understanding and agreed with the plan, discharged home.         ____________________________________________   FINAL CLINICAL IMPRESSION(S) / ED DIAGNOSES   Final diagnoses:  COVID-19  Pain of left upper extremity      MEDICATIONS GIVEN DURING THIS VISIT:  Medications  lidocaine (LIDODERM) 5 % 1 patch (1 patch Transdermal Patch Applied 01/12/21 2037)  acetaminophen (TYLENOL) tablet 1,000 mg (1,000 mg Oral Given 01/12/21 2034)  ketorolac (TORADOL) 30 MG/ML injection 15 mg (15 mg Intravenous Given 01/12/21 2035)  iohexol (OMNIPAQUE) 350 MG/ML injection 75 mL (75 mLs Intravenous Contrast Given 01/12/21 2213)     ED Discharge Orders     None        Note:  This document was prepared using Dragon voice recognition software and may include unintentional dictation errors.    Vanessa Glidden, MD 01/12/21 2317    Vanessa Argos, MD 01/12/21 [phone]

## 2021-01-12 NOTE — Discharge Instructions (Addendum)
Your COVID test was positive.  Usually you would not be positive this far out from your prior COVID infection.  Possible could just be a false positive.  However I would discuss with your primary care doctor about whether or not she should continue the ciprofloxacin in case this could be causing some of your pain because it can cause inflammation of your tendons.  I do not really see any signs of an ear infection today.  Your CTA of your head and neck was negative for any signs of stroke and you are unable to get MRI but if you develop worsening weakness in your arm or leg you need to return to the ER immediately for further work-up

## 2021-01-12 NOTE — ED Triage Notes (Signed)
Pt reports that she is having pain down her left arm and also in her left leg. She has had some N/V with it. She reports that it started today.

## 2021-01-12 NOTE — ED Notes (Signed)
Dr. Jari Pigg notified face to face of positive covid result at this time.

## 2021-01-18 DIAGNOSIS — R1084 Generalized abdominal pain: Secondary | ICD-10-CM | POA: Diagnosis not present

## 2021-01-23 DIAGNOSIS — I10 Essential (primary) hypertension: Secondary | ICD-10-CM | POA: Diagnosis not present

## 2021-01-23 DIAGNOSIS — R0602 Shortness of breath: Secondary | ICD-10-CM | POA: Diagnosis not present

## 2021-01-23 DIAGNOSIS — R079 Chest pain, unspecified: Secondary | ICD-10-CM | POA: Diagnosis not present

## 2021-01-25 ENCOUNTER — Ambulatory Visit
Admission: RE | Admit: 2021-01-25 | Discharge: 2021-01-25 | Disposition: A | Discharge status: Home / Self Care | Payer: Medicare Other | Source: Ambulatory Visit | Attending: Nurse Practitioner | Admitting: Nurse Practitioner

## 2021-01-25 ENCOUNTER — Other Ambulatory Visit: Payer: Self-pay

## 2021-01-25 DIAGNOSIS — Z1231 Encounter for screening mammogram for malignant neoplasm of breast: Secondary | ICD-10-CM | POA: Diagnosis not present

## 2021-01-30 DIAGNOSIS — R079 Chest pain, unspecified: Secondary | ICD-10-CM | POA: Diagnosis not present

## 2021-02-01 DIAGNOSIS — R079 Chest pain, unspecified: Secondary | ICD-10-CM | POA: Diagnosis not present

## 2021-02-03 DIAGNOSIS — I10 Essential (primary) hypertension: Secondary | ICD-10-CM | POA: Diagnosis not present

## 2021-02-03 DIAGNOSIS — R0602 Shortness of breath: Secondary | ICD-10-CM | POA: Diagnosis not present

## 2021-02-03 DIAGNOSIS — R079 Chest pain, unspecified: Secondary | ICD-10-CM | POA: Diagnosis not present

## 2021-02-07 DIAGNOSIS — G8929 Other chronic pain: Secondary | ICD-10-CM | POA: Diagnosis not present

## 2021-02-07 DIAGNOSIS — Z79899 Other long term (current) drug therapy: Secondary | ICD-10-CM | POA: Diagnosis not present

## 2021-02-07 DIAGNOSIS — F17200 Nicotine dependence, unspecified, uncomplicated: Secondary | ICD-10-CM | POA: Diagnosis not present

## 2021-02-09 DIAGNOSIS — R072 Precordial pain: Secondary | ICD-10-CM | POA: Diagnosis not present

## 2021-02-13 DIAGNOSIS — I10 Essential (primary) hypertension: Secondary | ICD-10-CM | POA: Diagnosis not present

## 2021-02-13 DIAGNOSIS — R0602 Shortness of breath: Secondary | ICD-10-CM | POA: Diagnosis not present

## 2021-02-13 DIAGNOSIS — R079 Chest pain, unspecified: Secondary | ICD-10-CM | POA: Diagnosis not present

## 2021-02-14 DIAGNOSIS — M545 Low back pain, unspecified: Secondary | ICD-10-CM | POA: Diagnosis not present

## 2021-02-14 DIAGNOSIS — M25512 Pain in left shoulder: Secondary | ICD-10-CM | POA: Diagnosis not present

## 2021-02-14 DIAGNOSIS — M5135 Other intervertebral disc degeneration, thoracolumbar region: Secondary | ICD-10-CM | POA: Diagnosis not present

## 2021-02-14 DIAGNOSIS — G5692 Unspecified mononeuropathy of left upper limb: Secondary | ICD-10-CM | POA: Diagnosis not present

## 2021-02-15 DIAGNOSIS — E063 Autoimmune thyroiditis: Secondary | ICD-10-CM | POA: Diagnosis not present

## 2021-02-20 ENCOUNTER — Other Ambulatory Visit: Payer: Self-pay

## 2021-02-20 ENCOUNTER — Encounter: Payer: Self-pay | Admitting: Dietician

## 2021-02-20 ENCOUNTER — Encounter: Payer: Medicare Other | Attending: Nurse Practitioner | Admitting: Dietician

## 2021-02-20 VITALS — Ht 64.0 in | Wt 231.9 lb

## 2021-02-20 DIAGNOSIS — E669 Obesity, unspecified: Secondary | ICD-10-CM | POA: Diagnosis not present

## 2021-02-20 DIAGNOSIS — E785 Hyperlipidemia, unspecified: Secondary | ICD-10-CM

## 2021-02-20 DIAGNOSIS — E7800 Pure hypercholesterolemia, unspecified: Secondary | ICD-10-CM | POA: Insufficient documentation

## 2021-02-20 DIAGNOSIS — R7303 Prediabetes: Secondary | ICD-10-CM | POA: Insufficient documentation

## 2021-02-20 DIAGNOSIS — Z6839 Body mass index (BMI) 39.0-39.9, adult: Secondary | ICD-10-CM

## 2021-02-20 NOTE — Progress Notes (Signed)
Medical Nutrition Therapy: Visit start time: [id number]  end time: 1445  Assessment:  Diagnosis: pre-diabetes, hyperlipidemia, obesity Past medical history: CHF, diverticulosis Psychosocial issues/ stress concerns: high stress level, has hoby to help manage stress  Preferred learning method:  Visual Hands-on   Current weight: 231.9lbs Height: 5'4" BMI: 39.81 Medications, supplements: reconciled list in medical record  Progress and evaluation:  Patient reports high LDL cholesterol for several years; she has started a second medication recently She reports recent HbA1C of 5.?%   Physical activity: some on the job activity  Dietary Intake:  Usual eating pattern includes 2 meals and 0-1 snacks per day. Dining out frequency: 5 meals per week.  Breakfast: Egg McMuffin Snack: none Lunch: skips Snack: none; occasionally fruit Supper: tacos, hamburger, spaghetti, chicken and rice/potatoes Snack: none Beverages: water 4-5 cups daily; diet soda, trying to decrease  Nutrition Care Education: Topics covered:  Basic nutrition: basic food groups, appropriate nutrient balance, appropriate meal and snack schedule, general nutrition guidelines    Weight control: importance of low sugar and low fat choices; portion control of starchy foods; estimated energy needs for weight loss at 1400kcal, provided guidance for 45% CHO, 25% pro, 30% fat Pre-diabetes: appropriate meal and snack schedule; appropriate carb intake and balance, healthy carb choices, role of protein; physical activity Hyperlipidemia: healthy and unhealthy fats; role of fiber; role of exercise   Nutritional Diagnosis:  [place]-2.2 Altered nutrition-related laboratory As related to pre-diabetes and hyperlipidemia.  As evidenced by elevated HbA1C and elevated LDL. Ault-3.3 Overweight/obesity As related to hypothyroidism, history of excess calories.  As evidenced by patient with current BMI of 39.81, working on diet and lifestyle changes to promote  weight loss and reduce health risk.  Intervention:  Instruction and discussion as noted above. Patient has been making some lifestyle changes and is motivated to continue. Established goals for additional change with direction from patient.  No follow up scheduled at this time; patient prefers to schedule later if needed.  Education Materials given:  [employer] guidelines for Diabetes Plate Planner with food lists, sample meal pattern Sample menus Snacking handout Visit summary with goals/ instructions   Learner/ who was taught:  Patient   Level of understanding: Verbalizes/ demonstrates competency   Demonstrated degree of understanding via:   Teach back Learning barriers: None  Willingness to learn/ readiness for change: Eager, change in progress   Monitoring and Evaluation:  Dietary intake, exercise, blood sugar, blood lipids, and body weight      follow up: prn

## 2021-02-20 NOTE — Patient Instructions (Addendum)
Control portions of starchy foods to control blood sugar. Keep to the size of a fisted hand, or less, with each meal.  Increase low carb veggies to fill the plate. Work to have a meal or snack every 3-5 hours during the day. If needed, you can have a protein shake or low sugar breakfast drink (like carnation) in place of a small meal or snack.

## 2021-02-21 DIAGNOSIS — Z79899 Other long term (current) drug therapy: Secondary | ICD-10-CM | POA: Diagnosis not present

## 2021-02-28 ENCOUNTER — Other Ambulatory Visit: Payer: Self-pay

## 2021-02-28 ENCOUNTER — Emergency Department
Admission: EM | Admit: 2021-02-28 | Discharge: 2021-02-28 | Disposition: A | Discharge status: Home / Self Care | Payer: Medicare Other | Attending: Emergency Medicine | Admitting: Emergency Medicine

## 2021-02-28 ENCOUNTER — Encounter: Payer: Self-pay | Admitting: Intensive Care

## 2021-02-28 DIAGNOSIS — Z87891 Personal history of nicotine dependence: Secondary | ICD-10-CM | POA: Diagnosis not present

## 2021-02-28 DIAGNOSIS — Z96651 Presence of right artificial knee joint: Secondary | ICD-10-CM | POA: Insufficient documentation

## 2021-02-28 DIAGNOSIS — H5711 Ocular pain, right eye: Secondary | ICD-10-CM | POA: Diagnosis present

## 2021-02-28 DIAGNOSIS — N189 Chronic kidney disease, unspecified: Secondary | ICD-10-CM | POA: Insufficient documentation

## 2021-02-28 DIAGNOSIS — I5031 Acute diastolic (congestive) heart failure: Secondary | ICD-10-CM | POA: Diagnosis not present

## 2021-02-28 DIAGNOSIS — Z79899 Other long term (current) drug therapy: Secondary | ICD-10-CM | POA: Insufficient documentation

## 2021-02-28 DIAGNOSIS — Z7982 Long term (current) use of aspirin: Secondary | ICD-10-CM | POA: Insufficient documentation

## 2021-02-28 DIAGNOSIS — H11441 Conjunctival cysts, right eye: Secondary | ICD-10-CM | POA: Diagnosis not present

## 2021-02-28 DIAGNOSIS — H11421 Conjunctival edema, right eye: Secondary | ICD-10-CM | POA: Diagnosis not present

## 2021-02-28 DIAGNOSIS — J45909 Unspecified asthma, uncomplicated: Secondary | ICD-10-CM | POA: Diagnosis not present

## 2021-02-28 DIAGNOSIS — E039 Hypothyroidism, unspecified: Secondary | ICD-10-CM | POA: Diagnosis not present

## 2021-02-28 NOTE — ED Provider Notes (Signed)
[facility] Provider Note   CSN: [id number] Arrival date & time: 02/28/21  1445     History Chief Complaint  Patient presents with   Eye Pain    Alexis Christensen is a 50 y.o. female presents to the ED for evaluation of right eye cyst. Cyst has been present x 1-2 days. No trauma/injury/vision changes. No FB sensation.   HPI     Past Medical History:  Diagnosis Date   Adopted    Anemia    Anxiety    Arthritis    Asthma    Bipolar 1 disorder ([place])    Bipolar disorder ([place])    Carpal tunnel syndrome 09/15/2014   CHF (congestive heart failure) (HCC)    Dialostic CHF   Chronic diarrhea 09/07/2015   Chronic kidney disease    H/O KIDNEY STONES   Chronic venous insufficiency 04/26/2016   Depression    DVT (deep venous thrombosis) ([place]) 2016   RIGHT LEG   Edema leg    Effusion of knee 12/30/2013   Family history of adverse reaction to anesthesia    ADOPTED   GERD (gastroesophageal reflux disease)    Goiter    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   Headache(784.0)    MIGRAINES   Heart burn    Heart murmur    History of kidney stones    HLD (hyperlipidemia)    Hx MRSA infection    Hypothyroidism    Lipoma of arm 05/11/2013   Lower extremity edema    Lymphedema 04/26/2016   Post traumatic stress disorder (PTSD)    PVD (peripheral vascular disease) (HCC)    Seasonal allergies    Stress incontinence    Thyroid disease    Urge incontinence     Patient Active Problem List   Diagnosis Date Noted   Bilateral inguinal hernia without obstruction or gangrene    Redness of eye, left 08/22/2016   Iron deficiency anemia 07/11/2016   ANA positive 06/13/2016   Bilateral hand pain 06/13/2016   Lymphedema 04/26/2016   Chronic venous insufficiency 04/26/2016   Swelling of limb 04/26/2016   Chronic diarrhea 09/07/2015   Fever 09/07/2015   Hypothyroidism 09/07/2015   Hypovitaminosis D 09/07/2015   Obesity 09/07/2015   Weight loss,  unintentional 09/07/2015   Bipolar mixed affective disorder, moderate ([place]) 09/05/2015   SUI (stress urinary incontinence, female) 05/18/2015   Pain in shoulder 09/15/2014   Carpal tunnel syndrome 09/15/2014   Chronic left shoulder pain 09/15/2014   Effusion of knee 12/30/2013   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   Gonalgia 12/30/2013   Lipoma of arm 05/11/2013    Past Surgical History:  Procedure Laterality Date   ABDOMINAL HYSTERECTOMY     ANKLE SURGERY     CHOLECYSTECTOMY     COLONOSCOPY WITH PROPOFOL N/A 06/24/2019   Procedure: COLONOSCOPY WITH PROPOFOL;  Surgeon: Toledo, Benay Pike, MD;  Location: [facility];  Service: Gastroenterology;  Laterality: N/A;   CYSTOSCOPY N/A 04/09/2016   Procedure: CYSTOSCOPY;  Surgeon: Bjorn Loser, MD;  Location: [facility];  Service: Urology;  Laterality: N/A;   DILATION AND CURETTAGE OF UTERUS     ESOPHAGOGASTRODUODENOSCOPY (EGD) WITH PROPOFOL N/A 06/24/2019   Procedure: ESOPHAGOGASTRODUODENOSCOPY (EGD) WITH PROPOFOL;  Surgeon: Toledo, Benay Pike, MD;  Location: [facility];  Service: Gastroenterology;  Laterality: N/A;   INGUINAL HERNIA REPAIR Bilateral 04/18/2017   Procedure: LAPAROSCOPIC BILATERAL INGUINAL HERNIA REPAIR;  Surgeon: Jules Husbands, MD;  Location: [facility];  Service:  General;  Laterality: Bilateral;   INTERSTIM IMPLANT PLACEMENT N/A 11/26/2016   Procedure: Barrie Lyme IMPLANT FIRST STAGE;  Surgeon: Bjorn Loser, MD;  Location: [facility];  Service: Urology;  Laterality: N/A;   INTERSTIM IMPLANT PLACEMENT N/A 11/26/2016   Procedure: Barrie Lyme IMPLANT SECOND STAGE;  Surgeon: Bjorn Loser, MD;  Location: [facility];  Service: Urology;  Laterality: N/A;   JOINT REPLACEMENT Right    knee   KNEE ARTHROSCOPY Bilateral    KNEE ARTHROSCOPY WITH LATERAL RELEASE Left 10/18/2015   Procedure: KNEE ARTHROSCOPY LATERAL AND PARTIAL SYNOVECTOMY;  Surgeon: Hessie Knows, MD;  Location: [facility];  Service: Orthopedics;  Laterality: Left;    Lymph Node removal  2015   Neck   PUBOVAGINAL SLING N/A 04/09/2016   Procedure: PUBO-VAGINAL SLING/ RETROPUBIC SLING;  Surgeon: Bjorn Loser, MD;  Location: [facility];  Service: Urology;  Laterality: N/A;   REPLACEMENT TOTAL KNEE Right    SHOULDER SURGERY Right 2014   TUBAL LIGATION     WRIST SURGERY Right    metal plate     OB History     Gravida  4   Para  2   Term      Preterm      AB  2   Living  2      SAB  2   IAB      Ectopic      Multiple      Live Births           Obstetric Comments  1st Menstrual Cycle:  10 1st Pregnancy:  31         Family History  Adopted: Yes  Family history unknown: Yes    Social History   Tobacco Use   Smoking status: Former    Packs/day: 0.25    Years: 20.00    Pack years: 5.00    Types: Cigarettes    Quit date: 11/13/2012    Years since quitting: 8.2   Smokeless tobacco: Former   Tobacco comments:    STRESS RELATED  Vaping Use   Vaping Use: Never used  Substance Use Topics   Alcohol use: Not Currently    Alcohol/week: 0.0 standard drinks    Comment: occasional   Drug use: No    Home Medications Prior to Admission medications   Medication Sig Start Date End Date Taking? Authorizing Provider  ABILIFY MAINTENA 400 MG PRSY prefilled syringe SMARTSIG:1 Each IM Once a Month 11/28/20   [provider]  albuterol (VENTOLIN HFA) 108 (90 Base) MCG/ACT inhaler Albuterol Sulfate HFA 108 (90 Base) MCG/ACT Inhalation Aerosol Solution QTY: 8.5 inhaler Days: 30 Refills: 1  Written: 06/09/19 Patient Instructions: Inhale 1 puff orally every 6 hours as needed for shortness of breath 06/09/19   [provider]  amitriptyline (ELAVIL) 75 MG tablet Take 1 tablet by mouth at bedtime. 01/23/21   [provider]  aspirin 81 MG chewable tablet Aspirin 81 MG Oral Tablet Chewable QTY: 30 tablet Days: 30 Refills: 0  Written: 01/23/21 Patient Instructions: once a day 01/23/21   [provider]   baclofen (LIORESAL) 10 MG tablet Take 10 mg by mouth 2 (two) times daily.    [provider]  clobetasol cream (TEMOVATE) AB-123456789 % Apply 1 application topically 2 (two) times daily as needed (for rash).     [provider]  colestipol (COLESTID) 1 g tablet Take 2 g by mouth daily at 12 noon.     [provider]  cyclobenzaprine (FLEXERIL)  10 MG tablet Take 10 mg by mouth 2 (two) times daily as needed for muscle spasms.  03/29/17   [provider]  diclofenac sodium (VOLTAREN) 1 % GEL Apply 2 g topically 4 (four) times daily. 12/23/17   Laban Emperor, PA-C  dicyclomine (BENTYL) 10 MG capsule Take 20 mg by mouth every 8 (eight) hours as needed. 11/19/20   [provider]  doxycycline (VIBRA-TABS) 100 MG tablet  10/01/19   [provider]  DULoxetine (CYMBALTA) 60 MG capsule Take 60 mg by mouth at bedtime.     [provider]  EPINEPHrine 0.3 mg/0.3 mL IJ SOAJ injection Inject 0.3 mg into the muscle as needed for anaphylaxis.    [provider]  fluticasone (FLONASE) 50 MCG/ACT nasal spray Place 2 sprays into both nostrils daily.     [provider]  furosemide (LASIX) 20 MG tablet Take 20 mg by mouth 2 (two) times daily. 02/19/19   [provider]  gabapentin (NEURONTIN) 300 MG capsule Take by mouth. 02/14/21   [provider]  gabapentin (NEURONTIN) 600 MG tablet Take 600 mg by mouth 2 (two) times daily.     [provider]  hydrOXYzine (ATARAX/VISTARIL) 25 MG tablet Take 50 mg by mouth every 8 (eight) hours as needed for itching.    [provider]  levocetirizine (XYZAL) 5 MG tablet Take 5 mg by mouth every evening.    [provider]  levothyroxine (SYNTHROID) 150 MCG tablet Take by mouth. 02/17/21 02/17/22  [provider]  lisinopril (ZESTRIL) 5 MG tablet Take 5 mg by mouth daily. 02/03/21   [provider]  mupirocin ointment (BACTROBAN) 2 %  02/25/20   [provider]  nitroGLYCERIN (NITROSTAT) 0.3 MG SL tablet Place under the tongue. 02/19/21   [provider]  nystatin (MYCOSTATIN/NYSTOP) powder SMARTSIG:Packet(s) Topical Every 8 Hours 02/16/21   [provider]  Olopatadine HCl 0.2 % SOLN Apply to eye.    [provider]  omeprazole (PRILOSEC) 20 MG capsule Take 20 mg by mouth daily.    [provider]  ondansetron (ZOFRAN ODT) 4 MG disintegrating tablet Take 1 tablet (4 mg total) by mouth every 8 (eight) hours as needed for nausea or vomiting. 01/25/18   Paulette Blanch, MD  pantoprazole (PROTONIX) 20 MG tablet Take 20 mg by mouth daily. 11/18/20   [provider]  potassium chloride SA (KLOR-CON) 20 MEQ tablet Take 20 mEq by mouth 2 (two) times daily.    [provider]  rizatriptan (MAXALT) 5 MG tablet Take 5 mg by mouth as needed for migraine. May repeat in 2 hours if needed    [provider]  rosuvastatin (CRESTOR) 40 MG tablet Take 40 mg by mouth daily.    [provider]  Semaglutide-Weight Management (WEGOVY) 0.25 MG/0.5ML SOAJ Wegovy 0.25 MG/0.5ML Subcutaneous Solution Auto-injector QTY: 2 mL Days: 30 Refills: 0  Written: 11/28/20 Patient Instructions: Inject 0.25 mg under the skin once per week x 4 weeks (if insurance does not cover, please do no fill) 11/28/20   [provider]  SUBOXONE 8-2 MG FILM Dissolve 1 film under tongue twice daily 09/14/16   [provider]    Allergies    Geodon [ziprasidone hydrochloride], Sulfacetamide sodium, Ziprasidone, Ziprasidone hcl, Ace inhibitors, Erythromycin, Hydromorphone, Lamictal [lamotrigine], Strawberry (diagnostic), and Sulfa antibiotics  Review of Systems   Review of Systems  Constitutional:  Negative for chills and fever.  Eyes:  Negative for photophobia, pain,  discharge and visual disturbance.  Gastrointestinal:  Negative for nausea and vomiting.  Skin:  Negative for rash and wound.   Physical  Exam Updated Vital Signs BP 122/81 (BP Location: Left Arm)   Pulse 64   Temp 97.8 F (36.6 C) (Oral)   Resp 20   Ht '5\' 4"'$  (1.626 m)   Wt 104.3 kg   SpO2 98%   BMI 39.48 kg/m   Physical Exam Constitutional:      Appearance: She is well-developed.  HENT:     Head: Normocephalic and atraumatic.     Nose: Nose normal.     Mouth/Throat:     Mouth: Mucous membranes are moist.  Eyes:     General:        Right eye: No discharge.        Left eye: No discharge.     Extraocular Movements: Extraocular movements intact.     Pupils: Pupils are equal, round, and reactive to light.     Comments: Small 1-2 mm clear fluid filled cyst along the conjunctiva with no tenderness to palpation. No drainage  Cardiovascular:     Rate and Rhythm: Normal rate.  Pulmonary:     Effort: Pulmonary effort is normal. No respiratory distress.  Musculoskeletal:        General: Normal range of motion.     Cervical back: Normal range of motion.  Skin:    General: Skin is warm.     Findings: No rash.  Neurological:     Mental Status: She is alert and oriented to person, place, and time.  Psychiatric:        Behavior: Behavior normal.        Thought Content: Thought content normal.    ED Results / Procedures / Treatments   Labs (all labs ordered are listed, but only abnormal results are displayed) Labs Reviewed - No data to display  EKG None  Radiology No results found.  Procedures Procedures   Medications Ordered in ED Medications - No data to display  ED Course  I have reviewed the triage vital signs and the nursing notes.  Pertinent labs & imaging results that were available during my care of the patient were reviewed by me and considered in my medical decision making (see chart for details).    MDM Rules/Calculators/A&P                         50 yo female with right eye conjunctival cyst. Patient with no pain or vision changes. Follow up with Dr. Edison Pace. Patient understands signs and  symptoms to return to the ER for Final Clinical Impression(s) / ED Diagnoses Final diagnoses:  Conjunctival cyst of right eye    Rx / DC Orders ED Discharge Orders     None        Renata Caprice 02/28/21 1548    Arta Silence, MD 02/28/21 2013

## 2021-02-28 NOTE — ED Triage Notes (Signed)
Patient c/o eye pain since yesterday. Reports there is a bump on the lower part of right eye

## 2021-02-28 NOTE — Discharge Instructions (Addendum)
Please call Dr. Edison Pace office tomorrow to schedule a follow up appointment. Return to the ER for any worsening symptoms or any urgent changes in your health.

## 2021-02-28 NOTE — ED Notes (Signed)
See triage note  Presents with h/a and pain in right eye  States she noticed a small bump to eyelid

## 2021-03-01 DIAGNOSIS — H11441 Conjunctival cysts, right eye: Secondary | ICD-10-CM | POA: Diagnosis not present

## 2021-03-06 ENCOUNTER — Emergency Department: Payer: Medicare Other

## 2021-03-06 ENCOUNTER — Emergency Department
Admission: EM | Admit: 2021-03-06 | Discharge: 2021-03-06 | Disposition: A | Discharge status: Home / Self Care | Payer: Medicare Other | Attending: Emergency Medicine | Admitting: Emergency Medicine

## 2021-03-06 ENCOUNTER — Encounter: Payer: Self-pay | Admitting: Emergency Medicine

## 2021-03-06 ENCOUNTER — Other Ambulatory Visit: Payer: Self-pay

## 2021-03-06 DIAGNOSIS — I509 Heart failure, unspecified: Secondary | ICD-10-CM | POA: Insufficient documentation

## 2021-03-06 DIAGNOSIS — J45909 Unspecified asthma, uncomplicated: Secondary | ICD-10-CM | POA: Diagnosis not present

## 2021-03-06 DIAGNOSIS — E039 Hypothyroidism, unspecified: Secondary | ICD-10-CM | POA: Diagnosis not present

## 2021-03-06 DIAGNOSIS — N189 Chronic kidney disease, unspecified: Secondary | ICD-10-CM | POA: Insufficient documentation

## 2021-03-06 DIAGNOSIS — Z87891 Personal history of nicotine dependence: Secondary | ICD-10-CM | POA: Diagnosis not present

## 2021-03-06 DIAGNOSIS — R7303 Prediabetes: Secondary | ICD-10-CM | POA: Diagnosis not present

## 2021-03-06 DIAGNOSIS — M7732 Calcaneal spur, left foot: Secondary | ICD-10-CM | POA: Diagnosis not present

## 2021-03-06 DIAGNOSIS — Z7982 Long term (current) use of aspirin: Secondary | ICD-10-CM | POA: Insufficient documentation

## 2021-03-06 DIAGNOSIS — M79672 Pain in left foot: Secondary | ICD-10-CM | POA: Insufficient documentation

## 2021-03-06 DIAGNOSIS — E785 Hyperlipidemia, unspecified: Secondary | ICD-10-CM | POA: Diagnosis not present

## 2021-03-06 DIAGNOSIS — M25572 Pain in left ankle and joints of left foot: Secondary | ICD-10-CM | POA: Diagnosis not present

## 2021-03-06 DIAGNOSIS — I10 Essential (primary) hypertension: Secondary | ICD-10-CM | POA: Diagnosis not present

## 2021-03-06 LAB — BASIC METABOLIC PANEL
BUN: 17 (ref 4–21)
CO2: 23 — AB (ref 13–22)
Chloride: 99 (ref 99–108)
Creatinine: 0.8 (ref <=1.1)
Glucose: 108
Potassium: 3.9 (ref 3.4–5.3)
Sodium: 139 (ref 137–147)

## 2021-03-06 LAB — HEPATIC FUNCTION PANEL
ALT: 12 (ref 7–35)
AST: 13 (ref 13–35)
Bilirubin, Total: 0.5

## 2021-03-06 LAB — TSH: TSH: 10.4 — AB (ref <=5.90)

## 2021-03-06 LAB — LIPID PANEL
Cholesterol: 232 — AB (ref 0–200)
HDL: 61 (ref 35–70)
LDL Cholesterol: 156
Triglycerides: 88 (ref 40–160)

## 2021-03-06 LAB — COMPREHENSIVE METABOLIC PANEL
Albumin: 4 (ref 3.5–5.0)
Calcium: 8.7 (ref 8.7–10.7)
GFR calc non Af Amer: 91
Globulin: 2.5

## 2021-03-06 LAB — CBC AND DIFFERENTIAL
HCT: 37 (ref 36–46)
Hemoglobin: 12.4 (ref 12.0–16.0)
Platelets: 248 (ref 150–399)
WBC: 5.8

## 2021-03-06 LAB — HEMOGLOBIN A1C: Hemoglobin A1C: 6

## 2021-03-06 LAB — CBC: RBC: 4.21 (ref 3.87–5.11)

## 2021-03-06 MED ORDER — IBUPROFEN 400 MG PO TABS
400.0000 mg | ORAL_TABLET | Freq: Once | ORAL | Status: AC
  Administered 2021-03-06: 400 mg via ORAL
  Filled 2021-03-06: qty 1

## 2021-03-06 NOTE — ED Triage Notes (Signed)
Pt comes into the ED via POV c/o left ankle pain after stepping off a stair "wrong".  Pt ambulatory at this time and in NAD.

## 2021-03-06 NOTE — ED Provider Notes (Signed)
[facility]  ____________________________________________   Event Date/Time   First MD Initiated Contact with Patient 03/06/21 1114     (approximate)  I have reviewed the triage vital signs and the nursing notes.   HISTORY  Chief Complaint Ankle Pain    HPI Alexis Christensen is a 50 y.o. female past medical history of DVT, bipolar disorder, CKD who presents with left foot pain.  Last night she was walking down the stairs when she twisted the foot internally, has had pain in the left lateral foot since.  Has been able to ambulate.  Took Tylenol for it.  Denies numbness or weakness.  Has a history of of ligaments in the left ankle.  No history of fracture.         Past Medical History:  Diagnosis Date   Adopted    Anemia    Anxiety    Arthritis    Asthma    Bipolar 1 disorder ([place])    Bipolar disorder ([place])    Carpal tunnel syndrome 09/15/2014   CHF (congestive heart failure) (HCC)    Dialostic CHF   Chronic diarrhea 09/07/2015   Chronic kidney disease    H/O KIDNEY STONES   Chronic venous insufficiency 04/26/2016   Depression    DVT (deep venous thrombosis) ([place]) 2016   RIGHT LEG   Edema leg    Effusion of knee 12/30/2013   Family history of adverse reaction to anesthesia    ADOPTED   GERD (gastroesophageal reflux disease)    Goiter    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   Headache(784.0)    MIGRAINES   Heart burn    Heart murmur    History of kidney stones    HLD (hyperlipidemia)    Hx MRSA infection    Hypothyroidism    Lipoma of arm 05/11/2013   Lower extremity edema    Lymphedema 04/26/2016   Post traumatic stress disorder (PTSD)    PVD (peripheral vascular disease) (HCC)    Seasonal allergies    Stress incontinence    Thyroid disease    Urge incontinence     Patient Active Problem List   Diagnosis Date Noted   Bilateral inguinal hernia without obstruction or gangrene    Redness of eye, left 08/22/2016   Iron  deficiency anemia 07/11/2016   ANA positive 06/13/2016   Bilateral hand pain 06/13/2016   Lymphedema 04/26/2016   Chronic venous insufficiency 04/26/2016   Swelling of limb 04/26/2016   Chronic diarrhea 09/07/2015   Fever 09/07/2015   Hypothyroidism 09/07/2015   Hypovitaminosis D 09/07/2015   Obesity 09/07/2015   Weight loss, unintentional 09/07/2015   Bipolar mixed affective disorder, moderate ([place]) 09/05/2015   SUI (stress urinary incontinence, female) 05/18/2015   Pain in shoulder 09/15/2014   Carpal tunnel syndrome 09/15/2014   Chronic left shoulder pain 09/15/2014   Effusion of knee 12/30/2013   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   Gonalgia 12/30/2013   Lipoma of arm 05/11/2013    Past Surgical History:  Procedure Laterality Date   ABDOMINAL HYSTERECTOMY     ANKLE SURGERY     CHOLECYSTECTOMY     COLONOSCOPY WITH PROPOFOL N/A 06/24/2019   Procedure: COLONOSCOPY WITH PROPOFOL;  Surgeon: Toledo, Benay Pike, MD;  Location: [facility];  Service: Gastroenterology;  Laterality: N/A;   CYSTOSCOPY N/A 04/09/2016   Procedure: CYSTOSCOPY;  Surgeon: Bjorn Loser, MD;  Location: [facility];  Service: Urology;  Laterality: N/A;   DILATION AND CURETTAGE  OF UTERUS     ESOPHAGOGASTRODUODENOSCOPY (EGD) WITH PROPOFOL N/A 06/24/2019   Procedure: ESOPHAGOGASTRODUODENOSCOPY (EGD) WITH PROPOFOL;  Surgeon: Toledo, Benay Pike, MD;  Location: [facility];  Service: Gastroenterology;  Laterality: N/A;   INGUINAL HERNIA REPAIR Bilateral 04/18/2017   Procedure: LAPAROSCOPIC BILATERAL INGUINAL HERNIA REPAIR;  Surgeon: Jules Husbands, MD;  Location: [facility];  Service: General;  Laterality: Bilateral;   INTERSTIM IMPLANT PLACEMENT N/A 11/26/2016   Procedure: Barrie Lyme IMPLANT FIRST STAGE;  Surgeon: Bjorn Loser, MD;  Location: [facility];  Service: Urology;  Laterality: N/A;   INTERSTIM IMPLANT PLACEMENT N/A 11/26/2016   Procedure: Barrie Lyme IMPLANT SECOND STAGE;  Surgeon: Bjorn Loser, MD;   Location: [facility];  Service: Urology;  Laterality: N/A;   JOINT REPLACEMENT Right    knee   KNEE ARTHROSCOPY Bilateral    KNEE ARTHROSCOPY WITH LATERAL RELEASE Left 10/18/2015   Procedure: KNEE ARTHROSCOPY LATERAL AND PARTIAL SYNOVECTOMY;  Surgeon: Hessie Knows, MD;  Location: [facility];  Service: Orthopedics;  Laterality: Left;   Lymph Node removal  2015   Neck   PUBOVAGINAL SLING N/A 04/09/2016   Procedure: PUBO-VAGINAL SLING/ RETROPUBIC SLING;  Surgeon: Bjorn Loser, MD;  Location: [facility];  Service: Urology;  Laterality: N/A;   REPLACEMENT TOTAL KNEE Right    SHOULDER SURGERY Right 2014   TUBAL LIGATION     WRIST SURGERY Right    metal plate    Prior to Admission medications   Medication Sig Start Date End Date Taking? Authorizing Provider  ABILIFY MAINTENA 400 MG PRSY prefilled syringe SMARTSIG:1 Each IM Once a Month 11/28/20   [provider]  albuterol (VENTOLIN HFA) 108 (90 Base) MCG/ACT inhaler Albuterol Sulfate HFA 108 (90 Base) MCG/ACT Inhalation Aerosol Solution QTY: 8.5 inhaler Days: 30 Refills: 1  Written: 06/09/19 Patient Instructions: Inhale 1 puff orally every 6 hours as needed for shortness of breath 06/09/19   [provider]  amitriptyline (ELAVIL) 75 MG tablet Take 1 tablet by mouth at bedtime. 01/23/21   [provider]  aspirin 81 MG chewable tablet Aspirin 81 MG Oral Tablet Chewable QTY: 30 tablet Days: 30 Refills: 0  Written: 01/23/21 Patient Instructions: once a day 01/23/21   [provider]  baclofen (LIORESAL) 10 MG tablet Take 10 mg by mouth 2 (two) times daily.    [provider]  clobetasol cream (TEMOVATE) [id number] % Apply 1 application topically 2 (two) times daily as needed (for rash).     [provider]  colestipol (COLESTID) 1 g tablet Take 2 g by mouth daily at 12 noon.     [provider]  cyclobenzaprine (FLEXERIL) 10 MG tablet Take 10 mg by mouth 2 (two) times daily as needed for muscle  spasms.  03/29/17   [provider]  diclofenac sodium (VOLTAREN) 1 % GEL Apply 2 g topically 4 (four) times daily. 12/23/17   Laban Emperor, PA-C  dicyclomine (BENTYL) 10 MG capsule Take 20 mg by mouth every 8 (eight) hours as needed. 11/19/20   [provider]  doxycycline (VIBRA-TABS) 100 MG tablet  10/01/19   [provider]  DULoxetine (CYMBALTA) 60 MG capsule Take 60 mg by mouth at bedtime.     [provider]  EPINEPHrine 0.3 mg/0.3 mL IJ SOAJ injection Inject 0.3 mg into the muscle as needed for anaphylaxis.    [provider]  fluticasone (FLONASE) 50 MCG/ACT nasal spray Place 2 sprays into both nostrils daily.     [provider]  furosemide (  LASIX) 20 MG tablet Take 20 mg by mouth 2 (two) times daily. 02/19/19   [provider]  gabapentin (NEURONTIN) 300 MG capsule Take by mouth. 02/14/21   [provider]  gabapentin (NEURONTIN) 600 MG tablet Take 600 mg by mouth 2 (two) times daily.     [provider]  hydrOXYzine (ATARAX/VISTARIL) 25 MG tablet Take 50 mg by mouth every 8 (eight) hours as needed for itching.    [provider]  levocetirizine (XYZAL) 5 MG tablet Take 5 mg by mouth every evening.    [provider]  levothyroxine (SYNTHROID) 150 MCG tablet Take by mouth. 02/17/21 02/17/22  [provider]  lisinopril (ZESTRIL) 5 MG tablet Take 5 mg by mouth daily. 02/03/21   [provider]  mupirocin ointment (BACTROBAN) 2 %  02/25/20   [provider]  nitroGLYCERIN (NITROSTAT) 0.3 MG SL tablet Place under the tongue. 02/19/21   [provider]  nystatin (MYCOSTATIN/NYSTOP) powder SMARTSIG:Packet(s) Topical Every 8 Hours 02/16/21   [provider]  Olopatadine HCl 0.2 % SOLN Apply to eye.    [provider]  omeprazole (PRILOSEC) 20 MG capsule Take 20 mg by mouth daily.    [provider]  ondansetron (ZOFRAN ODT) 4 MG disintegrating tablet  Take 1 tablet (4 mg total) by mouth every 8 (eight) hours as needed for nausea or vomiting. 01/25/18   Paulette Blanch, MD  pantoprazole (PROTONIX) 20 MG tablet Take 20 mg by mouth daily. 11/18/20   [provider]  potassium chloride SA (KLOR-CON) 20 MEQ tablet Take 20 mEq by mouth 2 (two) times daily.    [provider]  rizatriptan (MAXALT) 5 MG tablet Take 5 mg by mouth as needed for migraine. May repeat in 2 hours if needed    [provider]  rosuvastatin (CRESTOR) 40 MG tablet Take 40 mg by mouth daily.    [provider]  Semaglutide-Weight Management (WEGOVY) 0.25 MG/0.5ML SOAJ Wegovy 0.25 MG/0.5ML Subcutaneous Solution Auto-injector QTY: 2 mL Days: 30 Refills: 0  Written: 11/28/20 Patient Instructions: Inject 0.25 mg under the skin once per week x 4 weeks (if insurance does not cover, please do no fill) 11/28/20   [provider]  SUBOXONE 8-2 MG FILM Dissolve 1 film under tongue twice daily 09/14/16   [provider]    Allergies Geodon [ziprasidone hydrochloride], Sulfacetamide sodium, Ziprasidone, Ziprasidone hcl, Ace inhibitors, Erythromycin, Hydromorphone, Lamictal [lamotrigine], Strawberry (diagnostic), and Sulfa antibiotics  Family History  Adopted: Yes  Family history unknown: Yes    Social History Social History   Tobacco Use   Smoking status: Former    Packs/day: 0.25    Years: 20.00    Pack years: 5.00    Types: Cigarettes    Quit date: 11/13/2012    Years since quitting: 8.3   Smokeless tobacco: Former   Tobacco comments:    STRESS RELATED  Vaping Use   Vaping Use: Never used  Substance Use Topics   Alcohol use: Not Currently    Alcohol/week: 0.0 standard drinks    Comment: occasional   Drug use: No    Review of Systems   Review of Systems  Musculoskeletal:  Positive for arthralgias, gait problem, joint swelling and myalgias.  Neurological:  Negative for weakness and numbness.  All other systems reviewed and  are negative.  Physical Exam Updated Vital Signs BP 112/67 (BP Location: Left Arm)   Pulse (!) 57   Temp 98.2 F (36.8 C) (  Oral)   Resp 18   Ht '5\' 4"'$  (AB-123456789 m)   Wt 104.3 kg   SpO2 98%   BMI 39.48 kg/m   Physical Exam Vitals and nursing note reviewed.  Constitutional:      General: She is not in acute distress.    Appearance: Normal appearance.  HENT:     Head: Normocephalic and atraumatic.  Eyes:     General: No scleral icterus.    Conjunctiva/sclera: Conjunctivae normal.  Pulmonary:     Effort: Pulmonary effort is normal. No respiratory distress.     Breath sounds: Normal breath sounds. No wheezing.  Musculoskeletal:        General: No deformity or signs of injury.     Cervical back: Normal range of motion.     Comments: No obvious swelling or deformity of the left foot or ankle No malleoli tenderness bilaterally Tenderness to palpation over the base of the fifth metatarsal without bruising 2+ DP pulse 5 and 5 strength with plantar flexion dorsiflexion Tenderness of the tib-fib or fibular head  Skin:    Coloration: Skin is not jaundiced or pale.  Neurological:     General: No focal deficit present.     Mental Status: She is alert and oriented to person, place, and time. Mental status is at baseline.  Psychiatric:        Mood and Affect: Mood normal.        Behavior: Behavior normal.     LABS (all labs ordered are listed, but only abnormal results are displayed)  Labs Reviewed - No data to display ____________________________________________  EKG  N/a ____________________________________________  RADIOLOGY Almeta Monas, personally viewed and evaluated these images (plain radiographs) as part of my medical decision making, as well as reviewing the written report by the radiologist.  ED MD interpretation: I reviewed the x-ray of the ankle and foot which did not show any acute fracture or  dislocation    ____________________________________________   PROCEDURES  Procedure(s) performed (including Critical Care):  Procedures   ____________________________________________   INITIAL IMPRESSION / ASSESSMENT AND PLAN / ED COURSE     50 year old female presents with foot pain after an inversion injury.  Her pain is localized over the fifth metatarsal base on exam.  Ankle and foot films did not show any fracture.  Advised NSAIDs and RICE.  PCP follow-up.      ____________________________________________   FINAL CLINICAL IMPRESSION(S) / ED DIAGNOSES  Final diagnoses:  Left foot pain     ED Discharge Orders     None        Note:  This document was prepared using Dragon voice recognition software and may include unintentional dictation errors.    Rada Hay, MD 03/06/21 [phone]

## 2021-03-06 NOTE — Discharge Instructions (Addendum)
Your Xray did not show any broken bones. You can take ibuprofen for your pain.

## 2021-03-06 NOTE — ED Notes (Signed)
See triage note  Presents with pain to left foot and ankle   States missed a step  Good pulses  Unable to bear wt

## 2021-03-07 DIAGNOSIS — Z79899 Other long term (current) drug therapy: Secondary | ICD-10-CM | POA: Diagnosis not present

## 2021-03-07 DIAGNOSIS — F17200 Nicotine dependence, unspecified, uncomplicated: Secondary | ICD-10-CM | POA: Diagnosis not present

## 2021-03-07 DIAGNOSIS — G8929 Other chronic pain: Secondary | ICD-10-CM | POA: Diagnosis not present

## 2021-03-10 DIAGNOSIS — I10 Essential (primary) hypertension: Secondary | ICD-10-CM | POA: Diagnosis not present

## 2021-03-10 DIAGNOSIS — E785 Hyperlipidemia, unspecified: Secondary | ICD-10-CM | POA: Diagnosis not present

## 2021-03-10 DIAGNOSIS — E039 Hypothyroidism, unspecified: Secondary | ICD-10-CM | POA: Diagnosis not present

## 2021-03-10 DIAGNOSIS — G5602 Carpal tunnel syndrome, left upper limb: Secondary | ICD-10-CM | POA: Diagnosis not present

## 2021-03-10 DIAGNOSIS — R7303 Prediabetes: Secondary | ICD-10-CM | POA: Diagnosis not present

## 2021-03-19 DIAGNOSIS — Z0289 Encounter for other administrative examinations: Secondary | ICD-10-CM

## 2021-03-21 ENCOUNTER — Encounter: Payer: Self-pay | Admitting: Oncology

## 2021-03-21 DIAGNOSIS — F17200 Nicotine dependence, unspecified, uncomplicated: Secondary | ICD-10-CM | POA: Diagnosis not present

## 2021-03-24 DIAGNOSIS — E039 Hypothyroidism, unspecified: Secondary | ICD-10-CM | POA: Diagnosis not present

## 2021-03-24 DIAGNOSIS — M25532 Pain in left wrist: Secondary | ICD-10-CM | POA: Diagnosis not present

## 2021-03-24 DIAGNOSIS — E785 Hyperlipidemia, unspecified: Secondary | ICD-10-CM | POA: Diagnosis not present

## 2021-03-27 ENCOUNTER — Ambulatory Visit (INDEPENDENT_AMBULATORY_CARE_PROVIDER_SITE_OTHER): Payer: Medicare Other | Admitting: Urology

## 2021-03-27 ENCOUNTER — Other Ambulatory Visit: Payer: Self-pay

## 2021-03-27 VITALS — BP 108/74 | HR 71

## 2021-03-27 DIAGNOSIS — N3946 Mixed incontinence: Secondary | ICD-10-CM

## 2021-03-27 NOTE — Progress Notes (Signed)
03/27/2021 1:01 PM   Alexis Christensen Nov 03, 1970 [id number]  Referring provider: Perrin Maltese, MD [place],  [place] [address]  Chief Complaint  Patient presents with   SUI (stress urinary incontinence, female)    HPI: The patient had InterStim on 11/26/2016 for persistent overactive bladder and urgency incontinence after sling. The patient no longer has stress incontinence and had failed multiple medications. The need for the InterStim was discussed pre-operatively.   She is continent with intermittent urgency. She used to have mild bedwetting and does not have it either. Frequency also improved.    She stays quite dry during the day.  She gets up a few times at night.  If she holds it too long she can has some foot on the floor syndrome but the device appears to be working very well  Almost completely continent and very pleased.  N   Patient has not been seen for a few years.   And was doing great until 2 months ago.  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  High-volume leakage.  No cystitis symptoms.  She has never changed the program or amplitude.   Patient has a new onset urge incontinence.  During troubleshooting that was only 1 program.  Impedance was normal.  It was turned up amplitude and she felt it in the vagina.  Judson Roch will speak to the representative and start up programming exercise and look into its data  Today Frequency stable.  When amplitude was increased patient did well.  She was not leaking.  Patient is completely continent and very pleased.  No infections.  Has not changed the amplitude or any settings.   PMH: Past Medical History:  Diagnosis Date   Adopted    Anemia    Anxiety    Arthritis    Asthma    Bipolar 1 disorder ([facility])    Bipolar disorder ([place])    Carpal tunnel syndrome 09/15/2014   CHF (congestive heart failure) ([facility])    Dialostic CHF   Chronic diarrhea 09/07/2015   Chronic kidney disease    H/O KIDNEY STONES    Chronic venous insufficiency 04/26/2016   Depression    DVT (deep venous thrombosis) ([place]) 2016   RIGHT LEG   Edema leg    Effusion of knee 12/30/2013   Family history of adverse reaction to anesthesia    ADOPTED   GERD (gastroesophageal reflux disease)    Goiter    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   Headache(784.0)    MIGRAINES   Heart burn    Heart murmur    History of kidney stones    HLD (hyperlipidemia)    Hx MRSA infection    Hypothyroidism    Lipoma of arm 05/11/2013   Lower extremity edema    Lymphedema 04/26/2016   Post traumatic stress disorder (PTSD)    PVD (peripheral vascular disease) ([facility])    Seasonal allergies    Stress incontinence    Thyroid disease    Urge incontinence     Surgical History: Past Surgical History:  Procedure Laterality Date   ABDOMINAL HYSTERECTOMY     ANKLE SURGERY     CHOLECYSTECTOMY     COLONOSCOPY WITH PROPOFOL N/A 06/24/2019   Procedure: COLONOSCOPY WITH PROPOFOL;  Surgeon: Toledo, Benay Pike, MD;  Location: [facility];  Service: Gastroenterology;  Laterality: N/A;   CYSTOSCOPY N/A 04/09/2016   Procedure: CYSTOSCOPY;  Surgeon: Bjorn Loser, MD;  Location: [facility];  Service:  Urology;  Laterality: N/A;   DILATION AND CURETTAGE OF UTERUS     ESOPHAGOGASTRODUODENOSCOPY (EGD) WITH PROPOFOL N/A 06/24/2019   Procedure: ESOPHAGOGASTRODUODENOSCOPY (EGD) WITH PROPOFOL;  Surgeon: Toledo, Benay Pike, MD;  Location: [facility];  Service: Gastroenterology;  Laterality: N/A;   INGUINAL HERNIA REPAIR Bilateral 04/18/2017   Procedure: LAPAROSCOPIC BILATERAL INGUINAL HERNIA REPAIR;  Surgeon: Jules Husbands, MD;  Location: [facility];  Service: General;  Laterality: Bilateral;   INTERSTIM IMPLANT PLACEMENT N/A 11/26/2016   Procedure: Barrie Lyme IMPLANT FIRST STAGE;  Surgeon: Bjorn Loser, MD;  Location: [facility];  Service: Urology;  Laterality: N/A;   INTERSTIM IMPLANT PLACEMENT N/A 11/26/2016   Procedure: Barrie Lyme IMPLANT SECOND STAGE;   Surgeon: Bjorn Loser, MD;  Location: [facility];  Service: Urology;  Laterality: N/A;   JOINT REPLACEMENT Right    knee   KNEE ARTHROSCOPY Bilateral    KNEE ARTHROSCOPY WITH LATERAL RELEASE Left 10/18/2015   Procedure: KNEE ARTHROSCOPY LATERAL AND PARTIAL SYNOVECTOMY;  Surgeon: Hessie Knows, MD;  Location: [facility];  Service: Orthopedics;  Laterality: Left;   Lymph Node removal  2015   Neck   PUBOVAGINAL SLING N/A 04/09/2016   Procedure: PUBO-VAGINAL SLING/ RETROPUBIC SLING;  Surgeon: Bjorn Loser, MD;  Location: [facility];  Service: Urology;  Laterality: N/A;   REPLACEMENT TOTAL KNEE Right    SHOULDER SURGERY Right 2014   TUBAL LIGATION     WRIST SURGERY Right    metal plate    Home Medications:  Allergies as of 03/27/2021       Reactions   Geodon [ziprasidone Hydrochloride] Other (See Comments)   Numbness ,sob, headaches, blurred vision   Sulfacetamide Sodium Hives   Ziprasidone Anaphylaxis   Other reaction(s): Other (See Comments) Numbness ,sob, headaches, blurred vision Other reaction(s): Other (See Comments), Unknown Numbness ,sob, headaches, blurred vision Numbness ,sob, headaches, blurred vision Other reaction(s): Other (See Comments), Unknown Numbness ,sob, headaches, blurred vision Numbness ,sob, headaches, blurred vision Numbness ,sob, headaches, blurred vision Other reaction(s): Other (See Comments), Unknown Numbness ,sob, headaches, blurred vision Numbness ,sob, headaches, blurred vision Other reaction(s): Other (See Comments), Unknown Numbness ,sob, headaches, blurred vision Numbness ,sob, headaches, blurred vision   Ziprasidone Hcl Anaphylaxis, Other (See Comments)   Other reaction(s): Other (See Comments), Unknown Numbness ,sob, headaches, blurred vision Numbness ,sob, headaches, blurred vision   Ace Inhibitors Rash   Erythromycin Rash, Hives   Hydromorphone Rash   Lamictal [lamotrigine] Rash   Other reaction(s): Unknown   Strawberry (diagnostic)  Rash   Sulfa Antibiotics Hives, Rash   Other reaction(s): Unknown Other reaction(s): Unknown Other reaction(s): Unknown        Medication List        Accurate as of March 27, 2021  1:01 PM. If you have any questions, ask your nurse or doctor.          Abilify Maintena 400 MG Prsy prefilled syringe Generic drug: ARIPiprazole ER SMARTSIG:1 Each IM Once a Month   albuterol 108 (90 Base) MCG/ACT inhaler Commonly known as: VENTOLIN HFA Albuterol Sulfate HFA 108 (90 Base) MCG/ACT Inhalation Aerosol Solution QTY: 8.5 inhaler Days: 30 Refills: 1  Written: 06/09/19 Patient Instructions: Inhale 1 puff orally every 6 hours as needed for shortness of breath   amitriptyline 75 MG tablet Commonly known as: ELAVIL Take 1 tablet by mouth at bedtime.   aspirin 81 MG chewable tablet Aspirin 81 MG Oral Tablet Chewable QTY: 30 tablet Days: 30 Refills: 0  Written: 01/23/21 Patient Instructions: once a day  baclofen 10 MG tablet Commonly known as: LIORESAL Take 10 mg by mouth 2 (two) times daily.   cephALEXin 500 MG capsule Commonly known as: KEFLEX Take 500 mg by mouth every 8 (eight) hours.   clobetasol cream 0.05 % Commonly known as: TEMOVATE Apply 1 application topically 2 (two) times daily as needed (for rash).   colestipol 1 g tablet Commonly known as: COLESTID Take 2 g by mouth daily at 12 noon.   cyclobenzaprine 10 MG tablet Commonly known as: FLEXERIL Take 10 mg by mouth 2 (two) times daily as needed for muscle spasms.   diclofenac sodium 1 % Gel Commonly known as: Voltaren Apply 2 g topically 4 (four) times daily.   dicyclomine 10 MG capsule Commonly known as: BENTYL Take 20 mg by mouth every 8 (eight) hours as needed.   doxycycline 100 MG tablet Commonly known as: VIBRA-TABS   DULoxetine 60 MG capsule Commonly known as: CYMBALTA Take 60 mg by mouth at bedtime.   EPINEPHrine 0.3 mg/0.3 mL Soaj injection Commonly known as: EPI-PEN Inject 0.3 mg into the  muscle as needed for anaphylaxis.   fluticasone 50 MCG/ACT nasal spray Commonly known as: FLONASE Place 2 sprays into both nostrils daily.   furosemide 20 MG tablet Commonly known as: LASIX Take 20 mg by mouth 2 (two) times daily.   gabapentin 600 MG tablet Commonly known as: NEURONTIN Take 600 mg by mouth 2 (two) times daily.   gabapentin 300 MG capsule Commonly known as: NEURONTIN Take by mouth.   hydrOXYzine 25 MG tablet Commonly known as: ATARAX/VISTARIL Take 50 mg by mouth every 8 (eight) hours as needed for itching.   levocetirizine 5 MG tablet Commonly known as: XYZAL Take 5 mg by mouth every evening.   levothyroxine 150 MCG tablet Commonly known as: SYNTHROID Take by mouth.   lisinopril 5 MG tablet Commonly known as: ZESTRIL Take 5 mg by mouth daily.   mupirocin ointment 2 % Commonly known as: BACTROBAN   nitroGLYCERIN 0.3 MG SL tablet Commonly known as: NITROSTAT Place under the tongue.   nystatin powder Commonly known as: MYCOSTATIN/NYSTOP SMARTSIG:Packet(s) Topical Every 8 Hours   Olopatadine HCl 0.2 % Soln Apply to eye.   omeprazole 20 MG capsule Commonly known as: PRILOSEC Take 20 mg by mouth daily.   ondansetron 4 MG disintegrating tablet Commonly known as: Zofran ODT Take 1 tablet (4 mg total) by mouth every 8 (eight) hours as needed for nausea or vomiting.   pantoprazole 20 MG tablet Commonly known as: PROTONIX Take 20 mg by mouth daily.   potassium chloride SA 20 MEQ tablet Commonly known as: KLOR-CON Take 20 mEq by mouth 2 (two) times daily.   rizatriptan 5 MG tablet Commonly known as: MAXALT Take 5 mg by mouth as needed for migraine. May repeat in 2 hours if needed   rosuvastatin 20 MG tablet Commonly known as: CRESTOR Take 20 mg by mouth daily. What changed: Another medication with the same name was removed. Continue taking this medication, and follow the directions you see here. Changed by: Reece Packer, MD    Suboxone 8-2 MG Film Generic drug: Buprenorphine HCl-Naloxone HCl Dissolve 1 film under tongue twice daily   Wegovy 0.25 MG/0.5ML Soaj Generic drug: Semaglutide-Weight Management Wegovy 0.25 MG/0.5ML Subcutaneous Solution Auto-injector QTY: 2 mL Days: 30 Refills: 0  Written: 11/28/20 Patient Instructions: Inject 0.25 mg under the skin once per week x 4 weeks (if insurance does not cover, please do no fill)  Allergies:  Allergies  Allergen Reactions   Geodon [Ziprasidone Hydrochloride] Other (See Comments)    Numbness ,sob, headaches, blurred vision   Sulfacetamide Sodium Hives   Ziprasidone Anaphylaxis    Other reaction(s): Other (See Comments) Numbness ,sob, headaches, blurred vision  Other reaction(s): Other (See Comments), Unknown Numbness ,sob, headaches, blurred vision Numbness ,sob, headaches, blurred vision  Other reaction(s): Other (See Comments), Unknown Numbness ,sob, headaches, blurred vision Numbness ,sob, headaches, blurred vision   Numbness ,sob, headaches, blurred vision Other reaction(s): Other (See Comments), Unknown Numbness ,sob, headaches, blurred vision Numbness ,sob, headaches, blurred vision Other reaction(s): Other (See Comments), Unknown Numbness ,sob, headaches, blurred vision Numbness ,sob, headaches, blurred vision   Ziprasidone Hcl Anaphylaxis and Other (See Comments)    Other reaction(s): Other (See Comments), Unknown Numbness ,sob, headaches, blurred vision Numbness ,sob, headaches, blurred vision   Ace Inhibitors Rash   Erythromycin Rash and Hives   Hydromorphone Rash   Lamictal [Lamotrigine] Rash    Other reaction(s): Unknown   Strawberry (Diagnostic) Rash   Sulfa Antibiotics Hives and Rash    Other reaction(s): Unknown Other reaction(s): Unknown Other reaction(s): Unknown    Family History: Family History  Adopted: Yes  Family history unknown: Yes    Social History:  reports that she quit smoking about 8 years ago.  Her smoking use included cigarettes. She has a 5.00 pack-year smoking history. She has quit using smokeless tobacco. She reports that she does not currently use alcohol. She reports that she does not use drugs.  ROS:                                        Physical Exam: BP 108/74   Pulse 71   Constitutional:  Alert and oriented, No acute distress. HEENT: [place] AT, moist mucus membranes.  Trachea midline, no masses.   Laboratory Data: Lab Results  Component Value Date   WBC 5.8 01/12/2021   HGB 11.3 (L) 01/12/2021   HCT 34.1 (L) 01/12/2021   MCV 91.4 01/12/2021   PLT 234 01/12/2021    Lab Results  Component Value Date   CREATININE 0.81 01/12/2021    No results found for: PSA  No results found for: TESTOSTERONE  No results found for: HGBA1C  Urinalysis    Component Value Date/Time   COLORURINE YELLOW (A) 07/18/2020 2233   APPEARANCEUR Cloudy (A) 12/05/2020 1603   LABSPEC 1.028 07/18/2020 2233   PHURINE 6.0 07/18/2020 2233   GLUCOSEU Negative 12/05/2020 1603   HGBUR NEGATIVE 07/18/2020 2233   BILIRUBINUR Negative 12/05/2020 1603   KETONESUR NEGATIVE 07/18/2020 2233   PROTEINUR Trace (A) 12/05/2020 1603   PROTEINUR NEGATIVE 07/18/2020 2233   UROBILINOGEN 1.0 06/18/2011 1258   NITRITE Negative 12/05/2020 1603   NITRITE NEGATIVE 07/18/2020 2233   LEUKOCYTESUR Trace (A) 12/05/2020 1603   LEUKOCYTESUR NEGATIVE 07/18/2020 2233    Pertinent Imaging:   Assessment & Plan: I will see the patient as needed.  Battery life discussed  There are no diagnoses linked to this encounter.  No follow-ups on file.  Reece Packer, MD  [facility] [address], [place], [place] [address] [phone]

## 2021-03-28 ENCOUNTER — Ambulatory Visit (INDEPENDENT_AMBULATORY_CARE_PROVIDER_SITE_OTHER): Payer: Medicare Other | Admitting: Family Medicine

## 2021-03-28 ENCOUNTER — Encounter (INDEPENDENT_AMBULATORY_CARE_PROVIDER_SITE_OTHER): Payer: Self-pay | Admitting: Family Medicine

## 2021-03-28 ENCOUNTER — Other Ambulatory Visit: Payer: Self-pay

## 2021-03-28 ENCOUNTER — Encounter: Payer: Self-pay | Admitting: Oncology

## 2021-03-28 VITALS — BP 127/80 | HR 64 | Temp 97.7°F | Ht 64.0 in | Wt 229.0 lb

## 2021-03-28 DIAGNOSIS — R5383 Other fatigue: Secondary | ICD-10-CM

## 2021-03-28 DIAGNOSIS — Z1331 Encounter for screening for depression: Secondary | ICD-10-CM | POA: Diagnosis not present

## 2021-03-28 DIAGNOSIS — E038 Other specified hypothyroidism: Secondary | ICD-10-CM | POA: Diagnosis not present

## 2021-03-28 DIAGNOSIS — R7303 Prediabetes: Secondary | ICD-10-CM

## 2021-03-28 DIAGNOSIS — Z6839 Body mass index (BMI) 39.0-39.9, adult: Secondary | ICD-10-CM | POA: Diagnosis not present

## 2021-03-28 DIAGNOSIS — F3162 Bipolar disorder, current episode mixed, moderate: Secondary | ICD-10-CM

## 2021-03-28 DIAGNOSIS — E66812 Obesity, class 2: Secondary | ICD-10-CM

## 2021-03-28 DIAGNOSIS — E7849 Other hyperlipidemia: Secondary | ICD-10-CM

## 2021-03-28 DIAGNOSIS — R0602 Shortness of breath: Secondary | ICD-10-CM

## 2021-03-28 DIAGNOSIS — E559 Vitamin D deficiency, unspecified: Secondary | ICD-10-CM

## 2021-03-29 DIAGNOSIS — E063 Autoimmune thyroiditis: Secondary | ICD-10-CM | POA: Diagnosis not present

## 2021-03-29 LAB — VITAMIN D 25 HYDROXY (VIT D DEFICIENCY, FRACTURES): Vit D, 25-Hydroxy: 25 ng/mL — ABNORMAL LOW (ref 30.0–100.0)

## 2021-03-29 LAB — INSULIN, RANDOM: INSULIN: 7.4 u[IU]/mL (ref 2.6–24.9)

## 2021-03-29 NOTE — Progress Notes (Signed)
Dear Alexis Ohm, PA-C,   Thank you for referring Alexis Christensen to our clinic. The following note includes my evaluation and treatment recommendations.  Chief Complaint:   OBESITY [name] (MR# [id number]) is a 50 y.o. female who presents for evaluation and treatment of obesity and related comorbidities. Current BMI is Body mass index is 39.31 kg/m. Alexis Christensen has been struggling with her weight for many years and has been unsuccessful in either losing weight, maintaining weight loss, or reaching her healthy weight goal.  Alexis Christensen is currently in the action stage of change and ready to dedicate time achieving and maintaining a healthier weight. Alexis Christensen is interested in becoming our patient and working on intensive lifestyle modifications including (but not limited to) diet and exercise for weight loss.  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 Her 34 year old daughter, granddaughter, grandson, and other 6 year old daughter and granddaughter all live with her.   Alexis Christensen's habits were reviewed today and are as follows: Her family eats meals together, she thinks her family will eat healthier with her, her desired weight loss is 79 lbs, she has been heavy most of her life, she started gaining weight last year, her heaviest weight ever was 275 pounds, she is a picky eater and doesn't like to eat healthier foods, she has significant food cravings issues, she snacks frequently in the evenings, she skips meals frequently, she is frequently drinking liquids with calories, she frequently makes poor food choices, she has problems with excessive hunger, she frequently eats larger portions than normal, she has binge eating behaviors, and she struggles with emotional eating.  Depression Screen Alexis Christensen's Food and Mood (modified PHQ-9) score was 17.  Depression screen [facility] 2/9 03/28/2021  Decreased Interest 3  Down, Depressed, Hopeless 2  PHQ - 2 Score 5  Altered sleeping 1   Tired, decreased energy 3  Change in appetite 3  Feeling bad or failure about yourself  3  Trouble concentrating 1  Moving slowly or fidgety/restless 0  Suicidal thoughts 1  PHQ-9 Score 17  Difficult doing work/chores Somewhat difficult  Some recent data might be hidden   Subjective:   1. Other fatigue Alexis Christensen admits to daytime somnolence and admits to waking up still tired. Patent has a history of symptoms of daytime fatigue, morning fatigue, and morning headache. Alexis Christensen generally gets 4 or 5 hours of sleep per night, and states that she has poor sleep quality. Snoring is present. Apneic episodes are not present. Epworth Sleepiness Score is 13.  She was last tested 3-4 years ago and no OSA appreciated.  2. Shortness of breath on exertion Alexis Christensen notes increasing shortness of breath with exercising and seems to be worsening over time with weight gain. She notes getting out of breath sooner with activity than she used to. This has gotten worse recently. Alexis Christensen denies shortness of breath at rest or orthopnea.  3. Bipolar mixed affective disorder, moderate (HCC) Infantof has depression and anxiety. Pt denies suicidal or homicidal ideations. History of PTSD. PHQ= 17. She has a Social worker and is managed by Dr. Johnn Hai in [place].  4. Other specified hypothyroidism Alexis Christensen is managed by [facility]. Her TSH was 10.4 on 02/2021. Dose recently changed and pt has follow up appt.  5. Vitamin D deficiency She is currently taking no vitamin D supplement. She denies nausea, vomiting or muscle weakness.  6. Pre-diabetes Diagnosed 2-3 years ago. Pt's A1c was 6.0 on  August 2022. Medication: None.  7. Other hyperlipidemia Cardiology recently changed cholesterol medication from simvastatin to Crestor 20 mg based on LDL 156 on 03/06/2021. 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  Assessment/Plan:   1. Other fatigue Alexis Christensen does feel that her weight is causing her energy to be lower than it should  be. Fatigue may be related to obesity, depression or many other causes. Labs will be ordered, and in the meanwhile, Alexis Christensen will focus on self care including making healthy food choices, increasing physical activity and focusing on stress reduction.  2. Shortness of breath on exertion Alexis Christensen does feel that she gets out of breath more easily that she used to when she exercises. Alexis Christensen's shortness of breath appears to be obesity related and exercise induced. She has agreed to work on weight loss and gradually increase exercise to treat her exercise induced shortness of breath. Will continue to monitor closely.  3. Bipolar mixed affective disorder, moderate ([name]) Treatment per psychiatry and counselor.  4. Other specified hypothyroidism Patient with long-standing hypothyroidism, on levothyroxine therapy. She appears euthyroid. Orders and follow up as documented in patient record. Continue treatment per endocrinology.   Counseling Good thyroid control is important for overall health. Supratherapeutic thyroid levels are dangerous and will not improve weight loss results. The correct way to take levothyroxine is fasting, with water, separated by at least 30 minutes from breakfast, and separated by more than 4 hours from calcium, iron, multivitamins, acid reflux medications (PPIs).   5. Vitamin D deficiency Low Vitamin D level contributes to fatigue and are associated with obesity, breast, and colon cancer. She a will follow-up for routine testing of Vitamin D, at least 2-3 times per year to avoid over-replacement. Check labs today.  - VITAMIN D 25 Hydroxy (Vit-D Deficiency, Fractures)  6. Pre-diabetes Alexis Christensen will continue to work on prudent nutritional plan, weight loss, exercise, and decreasing simple carbohydrates to help decrease the risk of diabetes. Check labs today.  - Insulin, random  7. Other hyperlipidemia Cardiovascular risk and specific lipid/LDL goals reviewed.  We discussed several lifestyle  modifications today and Alexis Christensen will continue to work on diet, exercise and weight loss efforts. Orders and follow up as documented in patient record. Pt is to follow up with Dr. Humphrey Rolls regarding cholesterol. Focus on prudent nutritional plan and weight loss.  Counseling Intensive lifestyle modifications are the first line treatment for this issue. Dietary changes: Increase soluble fiber. Decrease simple carbohydrates. Exercise changes: Moderate to vigorous-intensity aerobic activity 150 minutes per week if tolerated. Lipid-lowering medications: see documented in medical record.  8. Screening for depression Lameka had a positive depression screening. Depression is commonly associated with obesity and often results in emotional eating behaviors. We will monitor this closely and work on CBT to help improve the non-hunger eating patterns. Referral to Psychology may be required if no improvement is seen as she continues in our clinic.  9. Obesity with current BMI of 39.9  Britnay is currently in the action stage of change and her goal is to continue with weight loss efforts. I recommend Kalen begin the structured treatment plan as follows:  She has agreed to the Category 1 Plan.  03/06/2021 labs done with provider = [place], NP, per PCP.  Exercise goals:  As is    Behavioral modification strategies: planning for success.  She was informed of the importance of frequent follow-up visits to maximize her success with intensive lifestyle modifications for her multiple health conditions. She was informed we would discuss her lab results  at her next visit unless there is a critical issue that needs to be addressed sooner. Aretta agreed to keep her next visit at the agreed upon time to discuss these results.  Objective:   Blood pressure 127/80, pulse 64, temperature 97.7 F (36.5 C), height '5\' 4"'$  (1.626 m), weight 229 lb (103.9 kg), SpO2 96 %. Body mass index is 39.31 kg/m.  EKG: 01/17/2021 Abnormal- sinus  rhythm, rate 61. (At [facility])  Indirect Calorimeter completed today shows a VO2 of 228 and a REE of 1570.  Her calculated basal metabolic rate is 123456 thus her basal metabolic rate is worse than expected.  General: Cooperative, alert, well developed, in no acute distress. HEENT: Conjunctivae and lids unremarkable. Cardiovascular: Regular rhythm.  Lungs: Normal work of breathing. Neurologic: No focal deficits.   Lab Results  Component Value Date   CREATININE 0.81 01/12/2021   BUN 19 01/12/2021   NA 137 01/12/2021   K 3.7 01/12/2021   CL 102 01/12/2021   CO2 28 01/12/2021   Lab Results  Component Value Date   ALT 20 07/18/2020   AST 21 07/18/2020   ALKPHOS 76 07/18/2020   BILITOT 0.5 07/18/2020   No results found for: HGBA1C Lab Results  Component Value Date   INSULIN 7.4 03/28/2021   Lab Results  Component Value Date   TSH 22.424 (H) 01/12/2021   No results found for: CHOL, HDL, LDLCALC, LDLDIRECT, TRIG, CHOLHDL Lab Results  Component Value Date   WBC 5.8 01/12/2021   HGB 11.3 (L) 01/12/2021   HCT 34.1 (L) 01/12/2021   MCV 91.4 01/12/2021   PLT 234 01/12/2021   Lab Results  Component Value Date   IRON 39 07/13/2016   TIBC 408 07/13/2016   FERRITIN 11 07/13/2016   Obesity Behavioral Intervention:   Approximately 15 minutes were spent on the discussion below.  ASK: We discussed the diagnosis of obesity with Kaloni today and Hye agreed to give [name] permission to discuss obesity behavioral modification therapy today.  ASSESS: Chanci has the diagnosis of obesity and her BMI today is 39.3. Alexis is in the action stage of change.   ADVISE: Ameka was educated on the multiple health risks of obesity as well as the benefit of weight loss to improve her health. She was advised of the need for long term treatment and the importance of lifestyle modifications to improve her current health and to decrease her risk of future health problems.  AGREE: Multiple dietary  modification options and treatment options were discussed and Kassi agreed to follow the recommendations documented in the above note.  ARRANGE: Nannette was educated on the importance of frequent visits to treat obesity as outlined per CMS and USPSTF guidelines and agreed to schedule her next follow up appointment today.  Attestation Statements:   Reviewed by clinician on day of visit: allergies, medications, problem list, medical history, surgical history, family history, social history, and previous encounter notes.  Coral Ceo, CMA, am acting as transcriptionist for [employer], DO.  I have reviewed the above documentation for accuracy and completeness, and I agree with the above. Marjory Sneddon, D.O.  The [place] was signed into law in 2016 which includes the topic of electronic health records.  This provides immediate access to information in MyChart.  This includes consultation notes, operative notes, office notes, lab results and pathology reports.  If you have any questions about what you read please let us know at your next visit  so we can discuss your concerns and take corrective action if need be.  We are right here with you.

## 2021-03-30 DIAGNOSIS — Z86010 Personal history of colonic polyps: Secondary | ICD-10-CM | POA: Diagnosis not present

## 2021-03-30 DIAGNOSIS — R1032 Left lower quadrant pain: Secondary | ICD-10-CM | POA: Diagnosis not present

## 2021-03-30 DIAGNOSIS — K5730 Diverticulosis of large intestine without perforation or abscess without bleeding: Secondary | ICD-10-CM | POA: Diagnosis not present

## 2021-03-30 DIAGNOSIS — R1031 Right lower quadrant pain: Secondary | ICD-10-CM | POA: Diagnosis not present

## 2021-03-30 DIAGNOSIS — K580 Irritable bowel syndrome with diarrhea: Secondary | ICD-10-CM | POA: Diagnosis not present

## 2021-04-04 DIAGNOSIS — Z79899 Other long term (current) drug therapy: Secondary | ICD-10-CM | POA: Diagnosis not present

## 2021-04-04 DIAGNOSIS — G8929 Other chronic pain: Secondary | ICD-10-CM | POA: Diagnosis not present

## 2021-04-04 DIAGNOSIS — F17200 Nicotine dependence, unspecified, uncomplicated: Secondary | ICD-10-CM | POA: Diagnosis not present

## 2021-04-11 ENCOUNTER — Encounter (INDEPENDENT_AMBULATORY_CARE_PROVIDER_SITE_OTHER): Payer: Self-pay | Admitting: Family Medicine

## 2021-04-11 ENCOUNTER — Other Ambulatory Visit: Payer: Self-pay

## 2021-04-11 ENCOUNTER — Ambulatory Visit (INDEPENDENT_AMBULATORY_CARE_PROVIDER_SITE_OTHER): Payer: Medicare Other | Admitting: Family Medicine

## 2021-04-11 ENCOUNTER — Encounter (INDEPENDENT_AMBULATORY_CARE_PROVIDER_SITE_OTHER): Payer: Self-pay

## 2021-04-11 VITALS — BP 100/68 | HR 60 | Temp 97.6°F | Ht 64.0 in | Wt 224.0 lb

## 2021-04-11 DIAGNOSIS — I10 Essential (primary) hypertension: Secondary | ICD-10-CM | POA: Diagnosis not present

## 2021-04-11 DIAGNOSIS — E559 Vitamin D deficiency, unspecified: Secondary | ICD-10-CM

## 2021-04-11 DIAGNOSIS — Z6839 Body mass index (BMI) 39.0-39.9, adult: Secondary | ICD-10-CM

## 2021-04-11 DIAGNOSIS — Z9189 Other specified personal risk factors, not elsewhere classified: Secondary | ICD-10-CM | POA: Diagnosis not present

## 2021-04-11 DIAGNOSIS — E038 Other specified hypothyroidism: Secondary | ICD-10-CM | POA: Diagnosis not present

## 2021-04-11 DIAGNOSIS — R7303 Prediabetes: Secondary | ICD-10-CM | POA: Diagnosis not present

## 2021-04-11 DIAGNOSIS — E7849 Other hyperlipidemia: Secondary | ICD-10-CM

## 2021-04-11 MED ORDER — VITAMIN D (ERGOCALCIFEROL) 1.25 MG (50000 UNIT) PO CAPS: 50000.0000 [IU] | ORAL_CAPSULE | ORAL | 0 refills | Status: DC

## 2021-04-12 NOTE — Progress Notes (Signed)
Chief Complaint:   OBESITY Alexis Christensen is here to discuss her progress with her obesity treatment plan along with follow-up of her obesity related diagnoses. Alexis Christensen is on the Category 1 Plan and states she is following her eating plan approximately 85-90% of the time. Alexis Christensen states she is not currently exercising.  Today's visit was #: 2 Starting weight: 229 lbs Starting date: 03/28/2021 Today's weight: 224 lbs Today's date: 04/11/2021 Total lbs lost to date: 5 Total lbs lost since last in-office visit: 5  Interim History: Alexis Christensen is here today for her first follow-up office visit since starting the program with [name].  All blood work/ lab tests that were recently ordered by myself or an outside provider were reviewed with patient today per their request.   Extended time was spent counseling her on all new disease processes that were discovered or preexisting ones that are affected by BMI.  she understands that many of these abnormalities will need to monitored regularly along with the current treatment plan of prudent dietary changes, in which we are making each and every office visit, to improve these health parameters. Alexis Christensen is here for a follow up office visit.  We reviewed her meal plan and questions were answered.  Patient's food recall appears to be accurate and consistent with what is on plan when she is following it.   When eating on plan, her hunger and cravings are well controlled.   Pt measured her proteins and is drinking 4-5 bottles of water per day.   Subjective:   1. Pre-diabetes Discussed labs with patient today. Alexis Christensen's fasting insulin is 7.4, and she has never been on medication. She denies sweet cravings.  2. Other hyperlipidemia Pt was changed from simvastatin to Crestor 20 mg several weeks ago. She has an upcoming lab appt for recheck on December 9th.   3. Other specified hypothyroidism Discussed labs with patient today. Alexis Christensen works with endocrinologist at  [facility] and they are adjusting Synthroid dose. Her TSH is 13.  4. Vitamin D deficiency Worsening. Discussed labs with patient today. Alexis Christensen is not on supplementation or Vit D currently. Last script was months ago.   5. Essential hypertension Asymptomatic. Pt has no concerns and denies dizziness. She has a BP monitor at home but is not using it. Medication: Lasix, lisinopril  6. At risk for heart disease Alexis Christensen is at a higher than average risk for cardiovascular disease due to hyperlipidemia, hypertension, and pre-diabetes.   Assessment/Plan:  No orders of the defined types were placed in this encounter.   Medications Discontinued During This Encounter  Medication Reason   nitroGLYCERIN (NITROSTAT) 0.3 MG SL tablet Error   levothyroxine (SYNTHROID) 150 MCG tablet Error     Meds ordered this encounter  Medications   Vitamin D, Ergocalciferol, (DRISDOL) 1.25 MG (50000 UNIT) CAPS capsule    Sig: Take 1 capsule (50,000 Units total) by mouth every 7 (seven) days.    Dispense:  4 capsule    Refill:  0    30 d supply;  ** OV for RF **   Do not send RF request     1. Pre-diabetes Tryphena will continue to work on weight loss, exercise, and decreasing simple carbohydrates to help decrease the risk of diabetes. Continue prudent nutritional plan and weight loss. We will monitor and recheck fasting insulin and A1c in 3 months.  2. Other hyperlipidemia Cardiovascular risk and specific lipid/LDL goals reviewed.  We discussed several lifestyle modifications  today and Renie will continue to work on diet, exercise and weight loss efforts. Orders and follow up as documented in patient record. Pt is having labs done with PCP again on Dec. 9.  Counseling Intensive lifestyle modifications are the first line treatment for this issue. Dietary changes: Increase soluble fiber. Decrease simple carbohydrates. Exercise changes: Moderate to vigorous-intensity aerobic activity 150 minutes per week if  tolerated. Lipid-lowering medications: see documented in medical record.  3. Other specified hypothyroidism Patient with long-standing hypothyroidism, on levothyroxine therapy. She appears euthyroid. Orders and follow up as documented in patient record. Take Synthroid first thing in the morning and take Protonix, multivitamin, calcium, TUMS, and cholesterol meds in the evening.  Counseling Good thyroid control is important for overall health. Supratherapeutic thyroid levels are dangerous and will not improve weight loss results. The correct way to take levothyroxine is fasting, with water, separated by at least 30 minutes from breakfast, and separated by more than 4 hours from calcium, iron, multivitamins, acid reflux medications (PPIs).   4. Vitamin D deficiency Plan: - Discussed importance of vitamin D to their health and well-being.  - possible symptoms of low Vitamin D can be low energy, depressed mood, muscle aches, joint aches, osteoporosis etc. - low Vitamin D levels may be linked to an increased risk of cardiovascular events and even increased risk of cancers- such as colon and breast.  - I recommend pt take a 50,000 IU weekly prescription vit D - see script below   - Informed patient this may be a lifelong thing, and she was encouraged to continue to take the medicine until told otherwise.   - we will need to monitor levels regularly (every 3-4 mo on average) to keep levels within normal limits.  - weight loss will likely improve availability of vitamin D, thus encouraged Arnisha to continue with meal plan and their weight loss efforts to further improve this condition - pt's questions and concerns regarding this condition addressed.  Start- Vitamin D, Ergocalciferol, (DRISDOL) 1.25 MG (50000 UNIT) CAPS capsule; Take 1 capsule (50,000 Units total) by mouth every 7 (seven) days.  Dispense: 4 capsule; Refill: 0  5. Essential hypertension Alexis Christensen is working on healthy weight loss and exercise  to improve blood pressure control. We will watch for signs of hypotension as she continues her lifestyle modifications.  6. At risk for heart disease Alexis Christensen was given approximately 23 minutes of coronary artery disease prevention counseling today. She is 50 y.o. female and has risk factors for heart disease including obesity. We discussed intensive lifestyle modifications today with an emphasis on specific weight loss instructions and strategies.   Repetitive spaced learning was employed today to elicit superior memory formation and behavioral change.  7. Obesity with current BMI of 38.6  Alexis Christensen is currently in the action stage of change. As such, her goal is to continue with weight loss efforts. She has agreed to the Category 1 Plan.   Increase water intake to 6-7 bottles per day.  Exercise goals:  As is  Behavioral modification strategies: increasing lean protein intake, decreasing simple carbohydrates, and planning for success.  Samoria has agreed to follow-up with our clinic in 2 weeks. She was informed of the importance of frequent follow-up visits to maximize her success with intensive lifestyle modifications for her multiple health conditions.   Objective:   Blood pressure 100/68, pulse 60, temperature 97.6 F (36.4 C), height 5\' 4"  (1.626 m), weight 224 lb (101.6 kg), SpO2 97 %. Body mass index  is 38.45 kg/m.  General: Cooperative, alert, well developed, in no acute distress. HEENT: Conjunctivae and lids unremarkable. Cardiovascular: Regular rhythm.  Lungs: Normal work of breathing. Neurologic: No focal deficits.   Lab Results  Component Value Date   CREATININE 0.8 03/06/2021   BUN 17 03/06/2021   NA 139 03/06/2021   K 3.9 03/06/2021   CL 99 03/06/2021   CO2 23 (A) 03/06/2021   Lab Results  Component Value Date   ALT 12 03/06/2021   AST 13 03/06/2021   ALKPHOS 76 07/18/2020   BILITOT 0.5 07/18/2020   Lab Results  Component Value Date   HGBA1C 6.0 03/06/2021   Lab  Results  Component Value Date   INSULIN 7.4 03/28/2021   Lab Results  Component Value Date   TSH 10.40 (A) 03/06/2021   Lab Results  Component Value Date   CHOL 232 (A) 03/06/2021   HDL 61 03/06/2021   LDLCALC 156 03/06/2021   TRIG 88 03/06/2021   Lab Results  Component Value Date   VD25OH 25.0 (L) 03/28/2021   Lab Results  Component Value Date   WBC 5.8 03/06/2021   HGB 12.4 03/06/2021   HCT 37 03/06/2021   MCV 91.4 01/12/2021   PLT 248 03/06/2021   Lab Results  Component Value Date   IRON 39 07/13/2016   TIBC 408 07/13/2016   FERRITIN 11 07/13/2016   Attestation Statements:   Reviewed by clinician on day of visit: allergies, medications, problem list, medical history, surgical history, family history, social history, and previous encounter notes.  Coral Ceo, CMA, am acting as transcriptionist for [employer], DO.  I have reviewed the above documentation for accuracy and completeness, and I agree with the above. Marjory Sneddon, D.O.  The [place] was signed into law in 2016 which includes the topic of electronic health records.  This provides immediate access to information in MyChart.  This includes consultation notes, operative notes, office notes, lab results and pathology reports.  If you have any questions about what you read please let us know at your next visit so we can discuss your concerns and take corrective action if need be.  We are right here with you.

## 2021-04-18 DIAGNOSIS — Z79899 Other long term (current) drug therapy: Secondary | ICD-10-CM | POA: Diagnosis not present

## 2021-04-18 DIAGNOSIS — F17200 Nicotine dependence, unspecified, uncomplicated: Secondary | ICD-10-CM | POA: Diagnosis not present

## 2021-04-25 ENCOUNTER — Ambulatory Visit (INDEPENDENT_AMBULATORY_CARE_PROVIDER_SITE_OTHER): Payer: Medicare Other | Admitting: Adult Health

## 2021-04-25 DIAGNOSIS — G5621 Lesion of ulnar nerve, right upper limb: Secondary | ICD-10-CM | POA: Diagnosis not present

## 2021-04-25 DIAGNOSIS — G5622 Lesion of ulnar nerve, left upper limb: Secondary | ICD-10-CM | POA: Diagnosis not present

## 2021-04-25 DIAGNOSIS — G5603 Carpal tunnel syndrome, bilateral upper limbs: Secondary | ICD-10-CM | POA: Diagnosis not present

## 2021-04-26 ENCOUNTER — Ambulatory Visit (INDEPENDENT_AMBULATORY_CARE_PROVIDER_SITE_OTHER): Payer: Medicare Other | Admitting: Bariatrics

## 2021-04-26 ENCOUNTER — Other Ambulatory Visit: Payer: Self-pay

## 2021-04-26 ENCOUNTER — Encounter (INDEPENDENT_AMBULATORY_CARE_PROVIDER_SITE_OTHER): Payer: Self-pay | Admitting: Bariatrics

## 2021-04-26 VITALS — BP 95/64 | HR 70 | Temp 97.9°F | Ht 64.0 in | Wt 223.0 lb

## 2021-04-26 DIAGNOSIS — I10 Essential (primary) hypertension: Secondary | ICD-10-CM | POA: Diagnosis not present

## 2021-04-26 DIAGNOSIS — Z6839 Body mass index (BMI) 39.0-39.9, adult: Secondary | ICD-10-CM

## 2021-04-26 DIAGNOSIS — E038 Other specified hypothyroidism: Secondary | ICD-10-CM

## 2021-04-26 NOTE — Progress Notes (Signed)
Chief Complaint:   OBESITY Alexis Christensen is here to discuss her progress with her obesity treatment plan along with follow-up of her obesity related diagnoses. Alexis Christensen is on the Category 1 Plan and states she is following her eating plan approximately 85% of the time. Alexis Christensen states she is doing 0 minutes 0 times per week.  Today's visit was #: 3 Starting weight: 229 lbs Starting date: 03/28/2021 Today's weight: 223 lbs Today's date: 04/26/2021 Total lbs lost to date: 6 lbs Total lbs lost since last in-office visit: 1 lb  Interim History: Alexis Christensen is down 1 lb since her last visit. She has been sick with allergies and on vacations. She is doing well with her water intake.   Subjective:   1. Essential hypertension [name] is taking Zestril currently. Her blood pressure was low.  2. Other specified hypothyroidism Alexis Christensen is currently taking Synthroid.  Assessment/Plan:   1. Essential hypertension Alexis Christensen is working on healthy weight loss and exercise to improve blood pressure control. We will watch for signs of hypotension as she continues her lifestyle modifications.  2. Other specified hypothyroidism [name] will continue Synthroid. Orders and follow up as documented in patient record.  Counseling Good thyroid control is important for overall health. Supratherapeutic thyroid levels are dangerous and will not improve weight loss results. Counseling: The correct way to take levothyroxine is fasting, with water, separated by at least 30 minutes from breakfast, and separated by more than 4 hours from calcium, iron, multivitamins, acid reflux medications (PPIs).    3. Obesity with current BMI of 38.4 Alexis Christensen is currently in the action stage of change. As such, her goal is to continue with weight loss efforts. She has agreed to the Category 1 Plan.   Alexis Christensen will adhere closely to the plan. She will be mindful eating. She will increase vegetables.   Exercise goals:  Alexis Christensen will begin walking.  Behavioral  modification strategies: increasing lean protein intake, decreasing simple carbohydrates, increasing vegetables, increasing water intake, decreasing eating out, no skipping meals, meal planning and cooking strategies, keeping healthy foods in the home, and planning for success.  Alexis Christensen has agreed to follow-up with our clinic in 2 weeks with Dr. [name]. She was informed of the importance of frequent follow-up visits to maximize her success with intensive lifestyle modifications for her multiple health conditions.   Objective:   Blood pressure 95/64, pulse 70, temperature 97.9 F (36.6 C), height 5\' 4"  (1.626 m), weight 223 lb (101.2 kg), SpO2 96 %. Body mass index is 38.28 kg/m.  General: Cooperative, alert, well developed, in no acute distress. HEENT: Conjunctivae and lids unremarkable. Cardiovascular: Regular rhythm.  Lungs: Normal work of breathing. Neurologic: No focal deficits.   Lab Results  Component Value Date   CREATININE 0.8 03/06/2021   BUN 17 03/06/2021   NA 139 03/06/2021   K 3.9 03/06/2021   CL 99 03/06/2021   CO2 23 (A) 03/06/2021   Lab Results  Component Value Date   ALT 12 03/06/2021   AST 13 03/06/2021   ALKPHOS 76 07/18/2020   BILITOT 0.5 07/18/2020   Lab Results  Component Value Date   HGBA1C 6.0 03/06/2021   Lab Results  Component Value Date   INSULIN 7.4 03/28/2021   Lab Results  Component Value Date   TSH 10.40 (A) 03/06/2021   Lab Results  Component Value Date   CHOL 232 (A) 03/06/2021   HDL 61 03/06/2021   LDLCALC 156 03/06/2021   TRIG 88 03/06/2021  Lab Results  Component Value Date   VD25OH 25.0 (L) 03/28/2021   Lab Results  Component Value Date   WBC 5.8 03/06/2021   HGB 12.4 03/06/2021   HCT 37 03/06/2021   MCV 91.4 01/12/2021   PLT 248 03/06/2021   Lab Results  Component Value Date   IRON 39 07/13/2016   TIBC 408 07/13/2016   FERRITIN 11 07/13/2016    Obesity Behavioral Intervention:   Approximately 15 minutes were  spent on the discussion below.  ASK: We discussed the diagnosis of obesity with Alexis Christensen today and Alexis Christensen agreed to give [name] permission to discuss obesity behavioral modification therapy today.  ASSESS: Alexis Christensen has the diagnosis of obesity and her BMI today is 38.4. Alexis Christensen is in the action stage of change.   ADVISE: [name] was educated on the multiple health risks of obesity as well as the benefit of weight loss to improve her health. She was advised of the need for long term treatment and the importance of lifestyle modifications to improve her current health and to decrease her risk of future health problems.  AGREE: Multiple dietary modification options and treatment options were discussed and Alexis Christensen agreed to follow the recommendations documented in the above note.  ARRANGE: Alexis Christensen was educated on the importance of frequent visits to treat obesity as outlined per CMS and USPSTF guidelines and agreed to schedule her next follow up appointment today.  Attestation Statements:   Reviewed by clinician on day of visit: allergies, medications, problem list, medical history, surgical history, family history, social history, and previous encounter notes.  I, [name], RMA, am acting as Location manager for [employer], DO.   I have reviewed the above documentation for accuracy and completeness, and I agree with the above. [name], DO

## 2021-04-27 ENCOUNTER — Encounter (INDEPENDENT_AMBULATORY_CARE_PROVIDER_SITE_OTHER): Payer: Self-pay | Admitting: Bariatrics

## 2021-04-29 ENCOUNTER — Encounter: Payer: Self-pay | Admitting: Emergency Medicine

## 2021-04-29 ENCOUNTER — Encounter: Payer: Self-pay | Admitting: Oncology

## 2021-04-29 ENCOUNTER — Other Ambulatory Visit: Payer: Self-pay

## 2021-04-29 ENCOUNTER — Emergency Department
Admission: EM | Admit: 2021-04-29 | Discharge: 2021-04-29 | Disposition: A | Discharge status: Home / Self Care | Payer: Medicare Other | Attending: Student in an Organized Health Care Education/Training Program | Admitting: Student in an Organized Health Care Education/Training Program

## 2021-04-29 DIAGNOSIS — I5031 Acute diastolic (congestive) heart failure: Secondary | ICD-10-CM | POA: Diagnosis not present

## 2021-04-29 DIAGNOSIS — E039 Hypothyroidism, unspecified: Secondary | ICD-10-CM | POA: Diagnosis not present

## 2021-04-29 DIAGNOSIS — Z87891 Personal history of nicotine dependence: Secondary | ICD-10-CM | POA: Insufficient documentation

## 2021-04-29 DIAGNOSIS — J45909 Unspecified asthma, uncomplicated: Secondary | ICD-10-CM | POA: Insufficient documentation

## 2021-04-29 DIAGNOSIS — J0100 Acute maxillary sinusitis, unspecified: Secondary | ICD-10-CM | POA: Insufficient documentation

## 2021-04-29 DIAGNOSIS — Z79899 Other long term (current) drug therapy: Secondary | ICD-10-CM | POA: Diagnosis not present

## 2021-04-29 DIAGNOSIS — Z96653 Presence of artificial knee joint, bilateral: Secondary | ICD-10-CM | POA: Insufficient documentation

## 2021-04-29 DIAGNOSIS — Z7982 Long term (current) use of aspirin: Secondary | ICD-10-CM | POA: Insufficient documentation

## 2021-04-29 DIAGNOSIS — R0981 Nasal congestion: Secondary | ICD-10-CM | POA: Diagnosis present

## 2021-04-29 MED ORDER — AMOXICILLIN-POT CLAVULANATE 875-125 MG PO TABS
1.0000 | ORAL_TABLET | Freq: Once | ORAL | Status: AC
  Administered 2021-04-29: 1 via ORAL
  Filled 2021-04-29: qty 1

## 2021-04-29 MED ORDER — AMOXICILLIN-POT CLAVULANATE 875-125 MG PO TABS: 1.0000 | ORAL_TABLET | Freq: Two times a day (BID) | ORAL | 0 refills | Status: DC

## 2021-04-29 NOTE — ED Triage Notes (Signed)
Pt repots congestion, cough, nasal drainage and bodyaches for about 2 weeks.

## 2021-04-29 NOTE — ED Provider Notes (Signed)
[facility] Emergency Department Provider Note  ____________________________________________  Time seen: Approximately 2:32 PM  I have reviewed the triage vital signs and the nursing notes.   HISTORY  Chief Complaint Nasal Congestion, Cough, and Chills    HPI Alexis Christensen is a 50 y.o. female who presents emergency department for evaluation of sinus pressure and congestion.  Patient states that if she lays back, she will have postnasal drip and start coughing as well.  No ongoing cough.  She denies any fevers.  Her ears feel "full" but no pain.  No loss of hearing.  No sore throat.  Patient denies any chest pain.  No abdominal symptoms.  Medical history as described below.  Patient denies complaints with chronic medical issues.       Past Medical History:  Diagnosis Date   Acute diastolic (congestive) heart failure (HCC)    ADHD    Adopted    Anemia    Anxiety    Arthritis    Asthma    B12 deficiency    Bilateral swelling of feet and ankles    Bipolar 1 disorder (HCC)    Bipolar disorder (HCC)    Carpal tunnel syndrome 09/15/2014   Chest pain    CHF (congestive heart failure) ([place])    Dialostic CHF   Chronic diarrhea 09/07/2015   Chronic kidney disease    H/O KIDNEY STONES   Chronic venous insufficiency 04/26/2016   Depression    DVT (deep venous thrombosis) ([place]) 2016   RIGHT LEG   Edema leg    Effusion of knee 12/30/2013   Family history of adverse reaction to anesthesia    ADOPTED   Food allergy    GERD (gastroesophageal reflux disease)    Goiter    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   Headache(784.0)    MIGRAINES   Heart burn    Heart murmur    History of blood clots    History of kidney stones    HLD (hyperlipidemia)    Hx MRSA infection    Hyperlipidemia    Hypothyroidism    Lipoma of arm 05/11/2013   Lower extremity edema    Lymphedema 04/26/2016   Other fatigue    Post traumatic stress disorder (PTSD)    Prediabetes     PVD (peripheral vascular disease) (HCC)    Seasonal allergies    Shortness of breath    Shortness of breath on exertion    Stress incontinence    Thyroid disease    Urge incontinence    Vitamin D deficiency     Patient Active Problem List   Diagnosis Date Noted   Bilateral inguinal hernia without obstruction or gangrene    Redness of eye, left 08/22/2016   Iron deficiency anemia 07/11/2016   ANA positive 06/13/2016   Bilateral hand pain 06/13/2016   Lymphedema 04/26/2016   Chronic venous insufficiency 04/26/2016   Swelling of limb 04/26/2016   Chronic diarrhea 09/07/2015   Fever 09/07/2015   Hypothyroidism 09/07/2015   Hypovitaminosis D 09/07/2015   Obesity 09/07/2015   Weight loss, unintentional 09/07/2015   Bipolar mixed affective disorder, moderate ([place]) 09/05/2015   SUI (stress urinary incontinence, female) 05/18/2015   Pain in shoulder 09/15/2014   Carpal tunnel syndrome 09/15/2014   Chronic left shoulder pain 09/15/2014   Effusion of knee 12/30/2013   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   Gonalgia 12/30/2013   Lipoma of arm 05/11/2013    Past Surgical History:  Procedure Laterality  Date   ABDOMINAL HYSTERECTOMY     ANKLE SURGERY     CHOLECYSTECTOMY     COLONOSCOPY WITH PROPOFOL N/A 06/24/2019   Procedure: COLONOSCOPY WITH PROPOFOL;  Surgeon: Toledo, Benay Pike, MD;  Location: [facility] END[facility]COPY;  Service: Gastroenterology;  Laterality: N/A;   CYSTOSCOPY N/A 04/09/2016   Procedure: CYSTOSCOPY;  Surgeon: Bjorn Loser, MD;  Location: [facility] ORS;  Service: Urology;  Laterality: N/A;   DILATION AND CURETTAGE OF UTERUS     ESOPHAGOGASTRODUODENOSCOPY (EGD) WITH PROPOFOL N/A 06/24/2019   Procedure: ESOPHAGOGASTRODUODENOSCOPY (EGD) WITH PROPOFOL;  Surgeon: Toledo, Benay Pike, MD;  Location: [facility]COPY;  Service: Gastroenterology;  Laterality: N/A;   INGUINAL HERNIA REPAIR Bilateral 04/18/2017   Procedure: LAPAROSCOPIC BILATERAL INGUINAL HERNIA REPAIR;  Surgeon: Jules Husbands, MD;  Location: [facility] ORS;  Service: General;  Laterality: Bilateral;   INTERSTIM IMPLANT PLACEMENT N/A 11/26/2016   Procedure: Barrie Lyme IMPLANT FIRST STAGE;  Surgeon: Bjorn Loser, MD;  Location: [facility] ORS;  Service: Urology;  Laterality: N/A;   INTERSTIM IMPLANT PLACEMENT N/A 11/26/2016   Procedure: Barrie Lyme IMPLANT SECOND STAGE;  Surgeon: Bjorn Loser, MD;  Location: [facility] ORS;  Service: Urology;  Laterality: N/A;   JOINT REPLACEMENT Right    knee   KNEE ARTHROSCOPY Bilateral    KNEE ARTHROSCOPY WITH LATERAL RELEASE Left 10/18/2015   Procedure: KNEE ARTHROSCOPY LATERAL AND PARTIAL SYNOVECTOMY;  Surgeon: Hessie Knows, MD;  Location: [facility] ORS;  Service: Orthopedics;  Laterality: Left;   Lymph Node removal  2015   Neck   PUBOVAGINAL SLING N/A 04/09/2016   Procedure: PUBO-VAGINAL SLING/ RETROPUBIC SLING;  Surgeon: Bjorn Loser, MD;  Location: [facility] ORS;  Service: Urology;  Laterality: N/A;   REPLACEMENT TOTAL KNEE Right    SHOULDER SURGERY Right 2014   TUBAL LIGATION     WRIST SURGERY Right    metal plate    Prior to Admission medications   Medication Sig Start Date End Date Taking? Authorizing Provider  amoxicillin-clavulanate (AUGMENTIN) 875-125 MG tablet Take 1 tablet by mouth 2 (two) times daily. 04/29/21  Yes Kinzey Sheriff, Charline Bills, PA-C  ABILIFY MAINTENA 400 MG PRSY prefilled syringe SMARTSIG:1 Each IM Once a Month 11/28/20   [provider]  amitriptyline (ELAVIL) 75 MG tablet Take 1 tablet by mouth at bedtime. 01/23/21   [provider]  aspirin 81 MG chewable tablet Aspirin 81 MG Oral Tablet Chewable QTY: 30 tablet Days: 30 Refills: 0  Written: 01/23/21 Patient Instructions: once a day 01/23/21   [provider]  cephALEXin (KEFLEX) 500 MG capsule Take 500 mg by mouth every 8 (eight) hours. 03/24/21   [provider]  colestipol (COLESTID) 1 g tablet Take 2 g by mouth 2 (two) times daily.    [provider]  dicyclomine  (BENTYL) 10 MG capsule Take 10 mg by mouth every 8 (eight) hours as needed. 11/19/20   [provider]  DULoxetine (CYMBALTA) 60 MG capsule Take 60 mg by mouth 2 (two) times daily.    [provider]  furosemide (LASIX) 20 MG tablet Take 40 mg by mouth daily. 02/19/19   [provider]  gabapentin (NEURONTIN) 600 MG tablet Take 600 mg by mouth 2 (two) times daily.     [provider]  hydrOXYzine (ATARAX/VISTARIL) 25 MG tablet Take 50 mg by mouth every 8 (eight) hours as needed for itching.    [provider]  levothyroxine (SYNTHROID) 175 MCG tablet Take 175 mcg by mouth daily before breakfast.    [provider]  lisinopril (ZESTRIL) 5 MG tablet Take 5 mg by mouth daily. 02/03/21   [provider]  nystatin (MYCOSTATIN/NYSTOP) powder SMARTSIG:Packet(s) Topical Every 8 Hours 02/16/21   [provider]  pantoprazole (PROTONIX) 20 MG tablet Take 20 mg by mouth daily. 11/18/20   [provider]  rosuvastatin (CRESTOR) 20 MG tablet Take 20 mg by mouth daily. 03/12/21   [provider]  SUBOXONE 8-2 MG FILM Dissolve 1 film under tongue twice daily 09/14/16   [provider]  triamcinolone cream (KENALOG) 0.1 % in the morning and at bedtime. Use BID for up to 10 days. 03/24/21   [provider]  Vitamin D, Ergocalciferol, (DRISDOL) 1.25 MG (50000 UNIT) CAPS capsule Take 1 capsule (50,000 Units total) by mouth every 7 (seven) days. 04/11/21   Opalski, Neoma Laming, DO    Allergies Geodon [ziprasidone hydrochloride], Sulfacetamide sodium, Ziprasidone, Ziprasidone hcl, Ace inhibitors, Erythromycin, Hydromorphone, Lamictal [lamotrigine], Strawberry (diagnostic), and Sulfa antibiotics  Family History  Adopted: Yes  Family history unknown: Yes    Social History Social History   Tobacco Use   Smoking status: Former    Packs/day: 0.50    Years: 10.00    Pack years: 5.00    Types: Cigarettes    Start date:  07/16/2008    Quit date: 07/16/2019    Years since quitting: 1.7   Smokeless tobacco: Former   Tobacco comments:    STRESS RELATED  Vaping Use   Vaping Use: Never used  Substance Use Topics   Alcohol use: Not Currently    Comment: Hisotry of ETOH abouse ASB patient   Drug use: Yes    Types: Other-see comments    Comment: Pt listed history of abuse of "prescription drugs"     Review of Systems  Constitutional: No fever/chills Eyes: No visual changes. No discharge ENT: Positive for nasal congestion and sinus pressure.  Positive for postnasal drip when laying down Cardiovascular: no chest pain. Respiratory: Cough and laying down.  No SOB. Gastrointestinal: No abdominal pain.  No nausea, no vomiting.  No diarrhea.  No constipation. Musculoskeletal: Negative for musculoskeletal pain. Skin: Negative for rash, abrasions, lacerations, ecchymosis. Neurological: Negative for headaches, focal weakness or numbness.  10 System ROS otherwise negative.  ____________________________________________   PHYSICAL EXAM:  VITAL SIGNS: ED Triage Vitals  Enc Vitals Group     BP 04/29/21 1414 105/68     Pulse Rate 04/29/21 1414 79     Resp 04/29/21 1414 18     Temp 04/29/21 1414 98.3 F (36.8 C)     Temp Source 04/29/21 1414 Oral     SpO2 04/29/21 1414 99 %     Weight 04/29/21 1412 222 lb 10.6 oz (101 kg)     Height 04/29/21 1412 5\' 4"  (1.626 m)     Head Circumference --      Peak Flow --      Pain Score 04/29/21 1412 8     Pain Loc --      Pain Edu? --      Excl. in [place]? --      Constitutional: Alert and oriented. Well appearing and in no acute distress. Eyes: Conjunctivae are normal. PERRL. EOMI. Head: Atraumatic. ENT:      Ears:       Nose: No congestion/rhinnorhea.  Turbinates are erythematous and edematous.  Tender to percussion over the frontal sinuses but much more tender over the maxillary sinuses to percussion.      Mouth/Throat: Mucous membranes are moist.  Neck: No  stridor.  Neck is supple full range of motion Hematological/Lymphatic/Immunilogical: No cervical lymphadenopathy. Cardiovascular: Normal rate, regular rhythm. Normal S1 and S2.  Good peripheral circulation. Respiratory: Normal respiratory effort without tachypnea or retractions. Lungs CTAB. Good air entry to the bases with no decreased or absent breath sounds. Musculoskeletal: Full range of motion to all extremities. No gross deformities appreciated. Neurologic:  Normal speech and language. No gross focal neurologic deficits are appreciated.  Skin:  Skin is warm, dry and intact. No rash noted. Psychiatric: Mood and affect are normal. Speech and behavior are normal. Patient exhibits appropriate insight and judgement.   ____________________________________________   LABS (all labs ordered are listed, but only abnormal results are displayed)  Labs Reviewed - No data to display ____________________________________________  EKG   ____________________________________________  RADIOLOGY   No results found.  ____________________________________________    PROCEDURES  Procedure(s) performed:    Procedures    Medications  amoxicillin-clavulanate (AUGMENTIN) 875-125 MG per tablet 1 tablet (has no administration in time range)     ____________________________________________   INITIAL IMPRESSION / ASSESSMENT AND PLAN / ED COURSE  Pertinent labs & imaging results that were available during my care of the patient were reviewed by me and considered in my medical decision making (see chart for details).  Review of the [place] CSRS was performed in accordance of the [place] prior to dispensing any controlled drugs.           Patient's diagnosis is consistent with bacterial sinusitis.  Patient presents emergency department with sinus pressure, congestion.  Symptoms have been ongoing x2 to 3 weeks.  She has considerable amount of pressure in the sinuses.  Tender to percussion on  exam.  Patient states that she does have a cough that is is when laying down.  I feel that this is likely postnasal drip induced cough she has no adventitious lung sounds.  Patient will be placed on antibiotics for sinus infection.  Continue to use allergy medication for improvement of congestion..  Return precautions discussed with the patient, follow-up primary care as needed.  Patient is given ED precautions to return to the ED for any worsening or new symptoms.     ____________________________________________  FINAL CLINICAL IMPRESSION(S) / ED DIAGNOSES  Final diagnoses:  Acute non-recurrent maxillary sinusitis      NEW MEDICATIONS STARTED DURING THIS VISIT:  ED Discharge Orders          Ordered    amoxicillin-clavulanate (AUGMENTIN) 875-125 MG tablet  2 times daily        04/29/21 1449                This chart was dictated using voice recognition software/Dragon. Despite best efforts to proofread, errors can occur which can change the meaning. Any change was purely unintentional.    Darletta Moll, PA-C 04/29/21 1450    Merlyn Lot, MD 04/29/21 1958

## 2021-05-02 DIAGNOSIS — G8929 Other chronic pain: Secondary | ICD-10-CM | POA: Diagnosis not present

## 2021-05-02 DIAGNOSIS — Z79899 Other long term (current) drug therapy: Secondary | ICD-10-CM | POA: Diagnosis not present

## 2021-05-09 ENCOUNTER — Encounter (INDEPENDENT_AMBULATORY_CARE_PROVIDER_SITE_OTHER): Payer: Self-pay | Admitting: Family Medicine

## 2021-05-09 ENCOUNTER — Other Ambulatory Visit: Payer: Self-pay

## 2021-05-09 ENCOUNTER — Ambulatory Visit (INDEPENDENT_AMBULATORY_CARE_PROVIDER_SITE_OTHER): Payer: Medicare Other | Admitting: Family Medicine

## 2021-05-09 VITALS — BP 101/66 | HR 69 | Temp 97.6°F | Ht 64.0 in | Wt 223.0 lb

## 2021-05-09 DIAGNOSIS — R7303 Prediabetes: Secondary | ICD-10-CM

## 2021-05-09 DIAGNOSIS — Z6839 Body mass index (BMI) 39.0-39.9, adult: Secondary | ICD-10-CM | POA: Diagnosis not present

## 2021-05-09 DIAGNOSIS — E559 Vitamin D deficiency, unspecified: Secondary | ICD-10-CM | POA: Diagnosis not present

## 2021-05-09 MED ORDER — METFORMIN HCL 500 MG PO TABS: 500.0000 mg | ORAL_TABLET | Freq: Every day | ORAL | 3 refills | Status: DC

## 2021-05-09 MED ORDER — VITAMIN D (ERGOCALCIFEROL) 1.25 MG (50000 UNIT) PO CAPS: 50000.0000 [IU] | ORAL_CAPSULE | ORAL | 0 refills | Status: DC

## 2021-05-09 MED ORDER — METFORMIN HCL 500 MG PO TABS: 500.0000 mg | ORAL_TABLET | Freq: Every day | ORAL | 0 refills | Status: DC

## 2021-05-09 NOTE — Progress Notes (Signed)
Chief Complaint:   OBESITY Alexis Christensen is here to discuss her progress with her obesity treatment plan along with follow-up of her obesity related diagnoses. Alexis Christensen is on the Category 1 Plan and states she is following her eating plan approximately 85% of the time. Alexis Christensen states she is doing 0 minutes 0 times per week.  Today's visit was #: 4 Starting weight: 229 lbs Starting date: 03/28/2021 Today's weight: 223 lbs Today's date: 05/09/2021 Total lbs lost to date: 6 Total lbs lost since last in-office visit: 0  Interim History: Alexis Christensen has done well maintaining her weight. She notes increased PM hunger and she is struggling with late night snacking.  Subjective:   1. Pre-diabetes Alexis Christensen's last A1c was elevated at 6.0. She notes increased PM polyphagia and she is open to looking at medication options.  2. Vitamin D deficiency Alexis Christensen's Vit D level is not yet at goal. She needs a refill today.  Assessment/Plan:   1. Pre-diabetes Alexis Christensen agreed to start metformin 500 mg q daily with food, with no refills. She will continue to work on weight loss, exercise, and decreasing simple carbohydrates to help decrease the risk of diabetes. We will recheck labs in 2 months.  - metFORMIN (GLUCOPHAGE) 500 MG tablet; Take 1 tablet (500 mg total) by mouth daily with breakfast.  Dispense: 30 tablet; Refill: 0  2. Vitamin D deficiency Low Vitamin D level contributes to fatigue and are associated with obesity, breast, and colon cancer. We will refill prescription Vitamin D for 1 month, and we will recheck labs in 2 months. Alexis Christensen will follow-up for routine testing of Vitamin D, at least 2-3 times per year to avoid over-replacement.  - Vitamin D, Ergocalciferol, (DRISDOL) 1.25 MG (50000 UNIT) CAPS capsule; Take 1 capsule (50,000 Units total) by mouth every 7 (seven) days.  Dispense: 4 capsule; Refill: 0  3. Obesity with current BMI of 38.3 Alexis Christensen is currently in the action stage of change. As such, her goal is to  continue with weight loss efforts. She has agreed to the Category 1 Plan.   Behavioral modification strategies: increasing lean protein intake, meal planning and cooking strategies, and better snacking choices.  Alexis Christensen has agreed to follow-up with our clinic in 2 to 3 weeks. She was informed of the importance of frequent follow-up visits to maximize her success with intensive lifestyle modifications for her multiple health conditions.   Objective:   Blood pressure 101/66, pulse 69, temperature 97.6 F (36.4 C), height 5\' 4"  (1.626 m), weight 223 lb (101.2 kg), SpO2 97 %. Body mass index is 38.28 kg/m.  General: Cooperative, alert, well developed, in no acute distress. HEENT: Conjunctivae and lids unremarkable. Cardiovascular: Regular rhythm.  Lungs: Normal work of breathing. Neurologic: No focal deficits.   Lab Results  Component Value Date   CREATININE 0.8 03/06/2021   BUN 17 03/06/2021   NA 139 03/06/2021   K 3.9 03/06/2021   CL 99 03/06/2021   CO2 23 (A) 03/06/2021   Lab Results  Component Value Date   ALT 12 03/06/2021   AST 13 03/06/2021   ALKPHOS 76 07/18/2020   BILITOT 0.5 07/18/2020   Lab Results  Component Value Date   HGBA1C 6.0 03/06/2021   Lab Results  Component Value Date   INSULIN 7.4 03/28/2021   Lab Results  Component Value Date   TSH 10.40 (A) 03/06/2021   Lab Results  Component Value Date   CHOL 232 (A) 03/06/2021   HDL 61 03/06/2021  [place] 156 03/06/2021   TRIG 88 03/06/2021   Lab Results  Component Value Date   VD25OH 25.0 (L) 03/28/2021   Lab Results  Component Value Date   WBC 5.8 03/06/2021   HGB 12.4 03/06/2021   HCT 37 03/06/2021   MCV 91.4 01/12/2021   PLT 248 03/06/2021   Lab Results  Component Value Date   IRON 39 07/13/2016   TIBC 408 07/13/2016   FERRITIN 11 07/13/2016    Obesity Behavioral Intervention:   Approximately 15 minutes were spent on the discussion below.  ASK: We discussed the diagnosis of obesity  with Alexis Christensen today and Alexis Christensen agreed to give [name] permission to discuss obesity behavioral modification therapy today.  ASSESS: Shinita has the diagnosis of obesity and her BMI today is 38.3. Elizabeht is in the action stage of change.   ADVISE: Jeymi was educated on the multiple health risks of obesity as well as the benefit of weight loss to improve her health. She was advised of the need for long term treatment and the importance of lifestyle modifications to improve her current health and to decrease her risk of future health problems.  AGREE: Multiple dietary modification options and treatment options were discussed and Masiyah agreed to follow the recommendations documented in the above note.  ARRANGE: Shawnta was educated on the importance of frequent visits to treat obesity as outlined per CMS and USPSTF guidelines and agreed to schedule her next follow up appointment today.  Attestation Statements:   Reviewed by clinician on day of visit: allergies, medications, problem list, medical history, surgical history, family history, social history, and previous encounter notes.   I, Trixie Dredge, am acting as transcriptionist for Dennard Nip, MD.  I have reviewed the above documentation for accuracy and completeness, and I agree with the above. -  Dennard Nip, MD

## 2021-05-11 DIAGNOSIS — E785 Hyperlipidemia, unspecified: Secondary | ICD-10-CM | POA: Diagnosis not present

## 2021-05-11 DIAGNOSIS — E039 Hypothyroidism, unspecified: Secondary | ICD-10-CM | POA: Diagnosis not present

## 2021-05-11 DIAGNOSIS — J329 Chronic sinusitis, unspecified: Secondary | ICD-10-CM | POA: Diagnosis not present

## 2021-05-11 DIAGNOSIS — G5602 Carpal tunnel syndrome, left upper limb: Secondary | ICD-10-CM | POA: Diagnosis not present

## 2021-05-11 DIAGNOSIS — J309 Allergic rhinitis, unspecified: Secondary | ICD-10-CM | POA: Diagnosis not present

## 2021-05-16 DIAGNOSIS — Z79899 Other long term (current) drug therapy: Secondary | ICD-10-CM | POA: Diagnosis not present

## 2021-05-16 DIAGNOSIS — F17200 Nicotine dependence, unspecified, uncomplicated: Secondary | ICD-10-CM | POA: Diagnosis not present

## 2021-05-22 DIAGNOSIS — J309 Allergic rhinitis, unspecified: Secondary | ICD-10-CM | POA: Diagnosis not present

## 2021-05-22 DIAGNOSIS — E039 Hypothyroidism, unspecified: Secondary | ICD-10-CM | POA: Diagnosis not present

## 2021-05-22 DIAGNOSIS — E785 Hyperlipidemia, unspecified: Secondary | ICD-10-CM | POA: Diagnosis not present

## 2021-05-22 DIAGNOSIS — R5383 Other fatigue: Secondary | ICD-10-CM | POA: Diagnosis not present

## 2021-05-22 DIAGNOSIS — R7303 Prediabetes: Secondary | ICD-10-CM | POA: Diagnosis not present

## 2021-05-23 ENCOUNTER — Encounter (INDEPENDENT_AMBULATORY_CARE_PROVIDER_SITE_OTHER): Payer: Self-pay | Admitting: Family Medicine

## 2021-05-23 ENCOUNTER — Other Ambulatory Visit: Payer: Self-pay

## 2021-05-23 ENCOUNTER — Ambulatory Visit (INDEPENDENT_AMBULATORY_CARE_PROVIDER_SITE_OTHER): Payer: Medicare Other | Admitting: Family Medicine

## 2021-05-23 VITALS — BP 97/66 | HR 61 | Temp 97.5°F | Ht 64.0 in | Wt 222.0 lb

## 2021-05-23 DIAGNOSIS — E66812 Obesity, class 2: Secondary | ICD-10-CM

## 2021-05-23 DIAGNOSIS — Z6839 Body mass index (BMI) 39.0-39.9, adult: Secondary | ICD-10-CM | POA: Diagnosis not present

## 2021-05-23 DIAGNOSIS — M654 Radial styloid tenosynovitis [de Quervain]: Secondary | ICD-10-CM | POA: Diagnosis not present

## 2021-05-23 DIAGNOSIS — E559 Vitamin D deficiency, unspecified: Secondary | ICD-10-CM

## 2021-05-23 DIAGNOSIS — R7303 Prediabetes: Secondary | ICD-10-CM

## 2021-05-23 DIAGNOSIS — I5030 Unspecified diastolic (congestive) heart failure: Secondary | ICD-10-CM | POA: Diagnosis not present

## 2021-05-23 MED ORDER — METFORMIN HCL 500 MG PO TABS: ORAL_TABLET | ORAL | 0 refills | Status: DC

## 2021-05-23 MED ORDER — VITAMIN D (ERGOCALCIFEROL) 1.25 MG (50000 UNIT) PO CAPS: 50000.0000 [IU] | ORAL_CAPSULE | ORAL | 0 refills | Status: DC

## 2021-05-23 NOTE — Progress Notes (Signed)
Chief Complaint:   OBESITY Alexis Christensen is here to discuss her progress with her obesity treatment plan along with follow-up of her obesity related diagnoses. Alexis Christensen is on the Category 1 Plan and states she is following her eating plan approximately 85% of the time. Alexis Christensen states she is doing 0 minutes 0 times per week.  Today's visit was #: 5 Starting weight: 229 lbs Starting date: 03/28/2021 Today's weight: 222 lbs Today's date: 05/23/2021 Total lbs lost to date: 7 Total lbs lost since last in-office visit: 11  Interim History: Alexis Christensen has done better with food prepping and looking at home. She also tries to not eat after 7 pm. She has been focusing on eating all her protein. She denies hunger or cravings and she feels satisfied.   Subjective:   1. Pre-diabetes Alexis Christensen started metformin at her last office visit. Her sweet cravings has decreased a lot and she is doing less snacking. She takes metformin at lunch, and she is tolerating it well with no side effects.  2. Vitamin D deficiency Alexis Christensen is currently taking prescription vitamin D 50,000 IU each week. She denies nausea, vomiting or muscle weakness.  3. Diastolic congestive heart failure, unspecified HF chronicity (HCC) Alexis Christensen has no concerns, shortness of breath, or PN dyspnea. She is taking lisinopril and Lasix, and she is managed by Cardiology. She sees Dr. Humphrey Rolls at [facility] in [place].  Assessment/Plan:  No orders of the defined types were placed in this encounter.   Medications Discontinued During This Encounter  Medication Reason   amoxicillin-clavulanate (AUGMENTIN) 875-125 MG tablet Error   cephALEXin (KEFLEX) 500 MG capsule Error   Vitamin D, Ergocalciferol, (DRISDOL) 1.25 MG (50000 UNIT) CAPS capsule Reorder   metFORMIN (GLUCOPHAGE) 500 MG tablet Reorder     Meds ordered this encounter  Medications   metFORMIN (GLUCOPHAGE) 500 MG tablet    Sig: 1 po with lunch daily    Dispense:  30 tablet    Refill:  0    Vitamin D, Ergocalciferol, (DRISDOL) 1.25 MG (50000 UNIT) CAPS capsule    Sig: Take 1 capsule (50,000 Units total) by mouth every 7 (seven) days.    Dispense:  4 capsule    Refill:  0    30 d supply;  ** OV for RF **   Do not send RF request     1. Pre-diabetes Alexis Christensen will continue to work on weight loss, exercise, and decreasing simple carbohydrates to help decrease the risk of diabetes. We will refill metformin for 1 month.  - metFORMIN (GLUCOPHAGE) 500 MG tablet; 1 po with lunch daily  Dispense: 30 tablet; Refill: 0  2. Vitamin D deficiency Low Vitamin D level contributes to fatigue and are associated with obesity, breast, and colon cancer. We will refill prescription Vitamin D for 1 month. Alexis Christensen will follow-up for routine testing of Vitamin D, at least 2-3 times per year to avoid over-replacement.  - Vitamin D, Ergocalciferol, (DRISDOL) 1.25 MG (50000 UNIT) CAPS capsule; Take 1 capsule (50,000 Units total) by mouth every 7 (seven) days.  Dispense: 4 capsule; Refill: 0  3. Diastolic congestive heart failure, unspecified HF chronicity (HCC) Alexis Christensen is stable with no concerns, and her blood pressure is stable as well. She is to start walking for 5-10 minutes q daily.  4. Obesity with current BMI of 38.2 Alexis Christensen is currently in the action stage of change. As such, her goal is to continue with weight loss efforts. She has agreed to the Category  1 Plan.   Exercise goals: Start 5-10 minutes of walking q daily.  Behavioral modification strategies: increasing lean protein intake and decreasing simple carbohydrates.  Alexis Christensen has agreed to follow-up with our clinic in 2 weeks. She was informed of the importance of frequent follow-up visits to maximize her success with intensive lifestyle modifications for her multiple health conditions.   Objective:   Blood pressure 97/66, pulse 61, temperature (!) 97.5 F (36.4 C), height 5\' 4"  (1.626 m), weight 222 lb (100.7 kg), SpO2 97 %. Body mass index is  38.11 kg/m.  General: Cooperative, alert, well developed, in no acute distress. HEENT: Conjunctivae and lids unremarkable. Cardiovascular: Regular rhythm.  Lungs: Normal work of breathing. Neurologic: No focal deficits.   Lab Results  Component Value Date   CREATININE 0.8 03/06/2021   BUN 17 03/06/2021   NA 139 03/06/2021   K 3.9 03/06/2021   CL 99 03/06/2021   CO2 23 (A) 03/06/2021   Lab Results  Component Value Date   ALT 12 03/06/2021   AST 13 03/06/2021   ALKPHOS 76 07/18/2020   BILITOT 0.5 07/18/2020   Lab Results  Component Value Date   HGBA1C 6.0 03/06/2021   Lab Results  Component Value Date   INSULIN 7.4 03/28/2021   Lab Results  Component Value Date   TSH 10.40 (A) 03/06/2021   Lab Results  Component Value Date   CHOL 232 (A) 03/06/2021   HDL 61 03/06/2021   LDLCALC 156 03/06/2021   TRIG 88 03/06/2021   Lab Results  Component Value Date   VD25OH 25.0 (L) 03/28/2021   Lab Results  Component Value Date   WBC 5.8 03/06/2021   HGB 12.4 03/06/2021   HCT 37 03/06/2021   MCV 91.4 01/12/2021   PLT 248 03/06/2021   Lab Results  Component Value Date   IRON 39 07/13/2016   TIBC 408 07/13/2016   FERRITIN 11 07/13/2016    Obesity Behavioral Intervention:   Approximately 15 minutes were spent on the discussion below.  ASK: We discussed the diagnosis of obesity with Cing today and Ovie agreed to give [name] permission to discuss obesity behavioral modification therapy today.  ASSESS: Raymonde has the diagnosis of obesity and her BMI today is 38.2. Zareah is in the action stage of change.   ADVISE: Ryonna was educated on the multiple health risks of obesity as well as the benefit of weight loss to improve her health. She was advised of the need for long term treatment and the importance of lifestyle modifications to improve her current health and to decrease her risk of future health problems.  AGREE: Multiple dietary modification options and treatment  options were discussed and Madysyn agreed to follow the recommendations documented in the above note.  ARRANGE: Monicka was educated on the importance of frequent visits to treat obesity as outlined per CMS and USPSTF guidelines and agreed to schedule her next follow up appointment today.  Attestation Statements:   Reviewed by clinician on day of visit: allergies, medications, problem list, medical history, surgical history, family history, social history, and previous encounter notes.   Wilhemena Durie, am acting as transcriptionist for [employer], DO.  I have reviewed the above documentation for accuracy and completeness, and I agree with the above. Marjory Sneddon, D.O.  The [place] was signed into law in 2016 which includes the topic of electronic health records.  This provides immediate access to information in MyChart.  This includes  consultation notes, operative notes, office notes, lab results and pathology reports.  If you have any questions about what you read please let us know at your next visit so we can discuss your concerns and take corrective action if need be.  We are right here with you.

## 2021-05-30 DIAGNOSIS — Z79899 Other long term (current) drug therapy: Secondary | ICD-10-CM | POA: Diagnosis not present

## 2021-05-31 DIAGNOSIS — B351 Tinea unguium: Secondary | ICD-10-CM | POA: Diagnosis not present

## 2021-05-31 DIAGNOSIS — M79675 Pain in left toe(s): Secondary | ICD-10-CM | POA: Diagnosis not present

## 2021-05-31 DIAGNOSIS — M79674 Pain in right toe(s): Secondary | ICD-10-CM | POA: Diagnosis not present

## 2021-05-31 DIAGNOSIS — L600 Ingrowing nail: Secondary | ICD-10-CM | POA: Diagnosis not present

## 2021-05-31 DIAGNOSIS — R7303 Prediabetes: Secondary | ICD-10-CM | POA: Diagnosis not present

## 2021-06-06 ENCOUNTER — Ambulatory Visit (INDEPENDENT_AMBULATORY_CARE_PROVIDER_SITE_OTHER): Payer: Medicare Other | Admitting: Family Medicine

## 2021-06-06 ENCOUNTER — Encounter: Payer: Self-pay | Admitting: Oncology

## 2021-06-06 ENCOUNTER — Encounter (INDEPENDENT_AMBULATORY_CARE_PROVIDER_SITE_OTHER): Payer: Self-pay | Admitting: Family Medicine

## 2021-06-06 ENCOUNTER — Other Ambulatory Visit: Payer: Self-pay

## 2021-06-06 ENCOUNTER — Encounter (INDEPENDENT_AMBULATORY_CARE_PROVIDER_SITE_OTHER): Payer: Self-pay

## 2021-06-06 VITALS — BP 121/78 | HR 61 | Temp 97.7°F | Ht 64.0 in | Wt 225.0 lb

## 2021-06-06 DIAGNOSIS — E559 Vitamin D deficiency, unspecified: Secondary | ICD-10-CM

## 2021-06-06 DIAGNOSIS — I10 Essential (primary) hypertension: Secondary | ICD-10-CM | POA: Diagnosis not present

## 2021-06-06 DIAGNOSIS — R7303 Prediabetes: Secondary | ICD-10-CM

## 2021-06-06 DIAGNOSIS — Z6839 Body mass index (BMI) 39.0-39.9, adult: Secondary | ICD-10-CM | POA: Diagnosis not present

## 2021-06-06 MED ORDER — VITAMIN D (ERGOCALCIFEROL) 1.25 MG (50000 UNIT) PO CAPS: 50000.0000 [IU] | ORAL_CAPSULE | ORAL | 0 refills | Status: DC

## 2021-06-07 NOTE — Progress Notes (Signed)
Chief Complaint:   OBESITY Alexis Christensen is here to discuss her progress with her obesity treatment plan along with follow-up of her obesity related diagnoses. Alexis Christensen is on the Category 1 Plan and states she is following her eating plan approximately 80% of the time. Alexis Christensen states she is walking 20 minutes 1 times per week.  Today's visit was #: 6 Starting weight: 229 lbs Starting date: 03/28/2021 Today's weight: 225 lbs Today's date: 06/06/2021 Total lbs lost to date: 4 Total lbs lost since last in-office visit: +3  Interim History: Alexis Christensen went out to eat twice the past few weeks, otherwise, followed the plan and denies hunger or cravings. She went to [employer] and a fried seafood restaurant.  Subjective:   1. Essential hypertension At goal. Alexis Christensen's BP was low at her last OV but she was asymptomatic. BP improved today.  2. Vitamin D deficiency She is currently taking prescription vitamin D 50,000 IU each week. She denies nausea, vomiting or muscle weakness.  Assessment/Plan:  No orders of the defined types were placed in this encounter.   Medications Discontinued During This Encounter  Medication Reason   Vitamin D, Ergocalciferol, (DRISDOL) 1.25 MG (50000 UNIT) CAPS capsule Reorder     Meds ordered this encounter  Medications   Vitamin D, Ergocalciferol, (DRISDOL) 1.25 MG (50000 UNIT) CAPS capsule    Sig: Take 1 capsule (50,000 Units total) by mouth every 7 (seven) days.    Dispense:  4 capsule    Refill:  0    30 d supply;  ** OV for RF **   Do not send RF request     1. Essential hypertension Alexis Christensen is working on healthy weight loss and exercise to improve blood pressure control. We will watch for signs of hypotension as she continues her lifestyle modifications. Check BP twice a week at home- pt has BP monitor.   2. Vitamin D deficiency Low Vitamin D level contributes to fatigue and are associated with obesity, breast, and colon cancer. She agrees to continue to take  prescription Vitamin D 50,000 IU every week and will follow-up for routine testing of Vitamin D, at least 2-3 times per year to avoid over-replacement.  Refill- Vitamin D, Ergocalciferol, (DRISDOL) 1.25 MG (50000 UNIT) CAPS capsule; Take 1 capsule (50,000 Units total) by mouth every 7 (seven) days.  Dispense: 4 capsule; Refill: 0  3. Obesity with current BMI of 38.62  Alexis Christensen is currently in the action stage of change. As such, her goal is to continue with weight loss efforts. She has agreed to the Category 1 Plan.   Handout: Eating Out Guide and Thanksgiving Day Strategies Strategies discussed with pt.  Exercise goals:  As is  Behavioral modification strategies: planning for success.  Alexis Christensen has agreed to follow-up with our clinic in 3 weeks. She was informed of the importance of frequent follow-up visits to maximize her success with intensive lifestyle modifications for her multiple health conditions.   Objective:   Blood pressure 121/78, pulse 61, temperature 97.7 F (36.5 C), height 5\' 4"  (1.626 m), weight 225 lb (102.1 kg), SpO2 97 %. Body mass index is 38.62 kg/m.  General: Cooperative, alert, well developed, in no acute distress. HEENT: Conjunctivae and lids unremarkable. Cardiovascular: Regular rhythm.  Lungs: Normal work of breathing. Neurologic: No focal deficits.   Lab Results  Component Value Date   CREATININE 0.8 03/06/2021   BUN 17 03/06/2021   NA 139 03/06/2021   K 3.9 03/06/2021   CL  99 03/06/2021   CO2 23 (A) 03/06/2021   Lab Results  Component Value Date   ALT 12 03/06/2021   AST 13 03/06/2021   ALKPHOS 76 07/18/2020   BILITOT 0.5 07/18/2020   Lab Results  Component Value Date   HGBA1C 6.0 03/06/2021   Lab Results  Component Value Date   INSULIN 7.4 03/28/2021   Lab Results  Component Value Date   TSH 10.40 (A) 03/06/2021   Lab Results  Component Value Date   CHOL 232 (A) 03/06/2021   HDL 61 03/06/2021   LDLCALC 156 03/06/2021   TRIG 88  03/06/2021   Lab Results  Component Value Date   VD25OH 25.0 (L) 03/28/2021   Lab Results  Component Value Date   WBC 5.8 03/06/2021   HGB 12.4 03/06/2021   HCT 37 03/06/2021   MCV 91.4 01/12/2021   PLT 248 03/06/2021   Lab Results  Component Value Date   IRON 39 07/13/2016   TIBC 408 07/13/2016   FERRITIN 11 07/13/2016    Obesity Behavioral Intervention:   Approximately 15 minutes were spent on the discussion below.  ASK: We discussed the diagnosis of obesity with [name] today and [name] agreed to give [name] permission to discuss obesity behavioral modification therapy today.  ASSESS: [name] has the diagnosis of obesity and her BMI today is 38.6. [name] is in the action stage of change.   ADVISE: [name] was educated on the multiple health risks of obesity as well as the benefit of weight loss to improve her health. She was advised of the need for long term treatment and the importance of lifestyle modifications to improve her current health and to decrease her risk of future health problems.  AGREE: Multiple dietary modification options and treatment options were discussed and [name] agreed to follow the recommendations documented in the above note.  ARRANGE: [name] was educated on the importance of frequent visits to treat obesity as outlined per CMS and USPSTF guidelines and agreed to schedule her next follow up appointment today.  Attestation Statements:   Reviewed by clinician on day of visit: allergies, medications, problem list, medical history, surgical history, family history, social history, and previous encounter notes.  Coral Ceo, CMA, am acting as transcriptionist for [employer], DO.  I have reviewed the above documentation for accuracy and completeness, and I agree with the above. Marjory Sneddon, D.O.  The [place] was signed into law in 2016 which includes the topic of electronic health records.  This provides immediate access to  information in MyChart.  This includes consultation notes, operative notes, office notes, lab results and pathology reports.  If you have any questions about what you read please let us know at your next visit so we can discuss your concerns and take corrective action if need be.  We are right here with you.

## 2021-06-13 DIAGNOSIS — F17200 Nicotine dependence, unspecified, uncomplicated: Secondary | ICD-10-CM | POA: Diagnosis not present

## 2021-06-13 DIAGNOSIS — G8929 Other chronic pain: Secondary | ICD-10-CM | POA: Diagnosis not present

## 2021-06-20 DIAGNOSIS — E785 Hyperlipidemia, unspecified: Secondary | ICD-10-CM | POA: Diagnosis not present

## 2021-06-20 DIAGNOSIS — R7303 Prediabetes: Secondary | ICD-10-CM | POA: Diagnosis not present

## 2021-06-20 DIAGNOSIS — E039 Hypothyroidism, unspecified: Secondary | ICD-10-CM | POA: Diagnosis not present

## 2021-06-20 DIAGNOSIS — R5383 Other fatigue: Secondary | ICD-10-CM | POA: Diagnosis not present

## 2021-06-20 LAB — LIPID PANEL
Cholesterol: 216 — AB (ref 0–200)
HDL: 61 (ref 35–70)
LDL Cholesterol: 141
Triglycerides: 80 (ref 40–160)

## 2021-06-20 LAB — HEPATIC FUNCTION PANEL
ALT: 13 (ref 7–35)
AST: 16 (ref 13–35)
Alkaline Phosphatase: 96 (ref 25–125)
Bilirubin, Total: 0.4

## 2021-06-20 LAB — CBC: RBC: 3.96 (ref 3.87–5.11)

## 2021-06-20 LAB — BASIC METABOLIC PANEL
BUN: 14 (ref 4–21)
CO2: 27 — AB (ref 13–22)
Chloride: 102 (ref 99–108)
Creatinine: 0.7 (ref <=1.1)
Glucose: 96
Potassium: 4.2 (ref 3.4–5.3)
Sodium: 138 (ref 137–147)

## 2021-06-20 LAB — CBC AND DIFFERENTIAL
HCT: 34 — AB (ref 36–46)
Hemoglobin: 11.6 — AB (ref 12.0–16.0)
WBC: 6.6

## 2021-06-20 LAB — COMPREHENSIVE METABOLIC PANEL
Albumin: 4.2 (ref 3.5–5.0)
Calcium: 9.1 (ref 8.7–10.7)
GFR calc non Af Amer: 97
Globulin: 2.5

## 2021-06-20 LAB — TSH: TSH: 8.66 — AB (ref 0.41–5.90)

## 2021-06-20 LAB — HEMOGLOBIN A1C: Hemoglobin A1C: 5.7

## 2021-06-23 DIAGNOSIS — R7303 Prediabetes: Secondary | ICD-10-CM | POA: Diagnosis not present

## 2021-06-23 DIAGNOSIS — E039 Hypothyroidism, unspecified: Secondary | ICD-10-CM | POA: Diagnosis not present

## 2021-06-23 DIAGNOSIS — J309 Allergic rhinitis, unspecified: Secondary | ICD-10-CM | POA: Diagnosis not present

## 2021-06-23 DIAGNOSIS — I10 Essential (primary) hypertension: Secondary | ICD-10-CM | POA: Diagnosis not present

## 2021-06-23 DIAGNOSIS — E785 Hyperlipidemia, unspecified: Secondary | ICD-10-CM | POA: Diagnosis not present

## 2021-06-27 ENCOUNTER — Ambulatory Visit (INDEPENDENT_AMBULATORY_CARE_PROVIDER_SITE_OTHER): Payer: Medicare Other | Admitting: Adult Health

## 2021-06-27 DIAGNOSIS — Z79899 Other long term (current) drug therapy: Secondary | ICD-10-CM | POA: Diagnosis not present

## 2021-06-30 DIAGNOSIS — Z7984 Long term (current) use of oral hypoglycemic drugs: Secondary | ICD-10-CM | POA: Diagnosis not present

## 2021-06-30 DIAGNOSIS — M654 Radial styloid tenosynovitis [de Quervain]: Secondary | ICD-10-CM | POA: Diagnosis not present

## 2021-06-30 DIAGNOSIS — Z86718 Personal history of other venous thrombosis and embolism: Secondary | ICD-10-CM | POA: Diagnosis not present

## 2021-06-30 DIAGNOSIS — Z7982 Long term (current) use of aspirin: Secondary | ICD-10-CM | POA: Diagnosis not present

## 2021-07-17 ENCOUNTER — Encounter: Payer: Self-pay | Admitting: Oncology

## 2021-07-17 ENCOUNTER — Emergency Department
Admission: EM | Admit: 2021-07-17 | Discharge: 2021-07-17 | Disposition: A | Discharge status: Home / Self Care | Payer: Medicare Other | Attending: Emergency Medicine | Admitting: Emergency Medicine

## 2021-07-17 ENCOUNTER — Other Ambulatory Visit: Payer: Self-pay

## 2021-07-17 DIAGNOSIS — U071 COVID-19: Secondary | ICD-10-CM | POA: Insufficient documentation

## 2021-07-17 DIAGNOSIS — I509 Heart failure, unspecified: Secondary | ICD-10-CM | POA: Diagnosis not present

## 2021-07-17 DIAGNOSIS — J45909 Unspecified asthma, uncomplicated: Secondary | ICD-10-CM | POA: Insufficient documentation

## 2021-07-17 DIAGNOSIS — A084 Viral intestinal infection, unspecified: Secondary | ICD-10-CM | POA: Insufficient documentation

## 2021-07-17 DIAGNOSIS — K529 Noninfective gastroenteritis and colitis, unspecified: Secondary | ICD-10-CM

## 2021-07-17 DIAGNOSIS — N390 Urinary tract infection, site not specified: Secondary | ICD-10-CM | POA: Diagnosis not present

## 2021-07-17 DIAGNOSIS — R112 Nausea with vomiting, unspecified: Secondary | ICD-10-CM | POA: Diagnosis present

## 2021-07-17 LAB — RESP PANEL BY RT-PCR (FLU A&B, COVID) ARPGX2
Influenza A by PCR: NEGATIVE
Influenza B by PCR: NEGATIVE
SARS Coronavirus 2 by RT PCR: POSITIVE — AB

## 2021-07-17 LAB — URINALYSIS, ROUTINE W REFLEX MICROSCOPIC
Bacteria, UA: NONE SEEN
Bilirubin Urine: NEGATIVE
Glucose, UA: NEGATIVE mg/dL
Hgb urine dipstick: NEGATIVE
Ketones, ur: NEGATIVE mg/dL
Nitrite: NEGATIVE
Protein, ur: 30 mg/dL — AB
Specific Gravity, Urine: 1.028 (ref 1.005–1.030)
WBC, UA: >50 WBC/hpf — ABNORMAL HIGH (ref 0–5)
pH: 5 (ref 5.0–8.0)

## 2021-07-17 LAB — CBC
HCT: 41.3 % (ref 36.0–46.0)
Hemoglobin: 13.5 g/dL (ref 12.0–15.0)
MCH: 29.7 pg (ref 26.0–34.0)
MCHC: 32.7 g/dL (ref 30.0–36.0)
MCV: 90.8 fL (ref 80.0–100.0)
Platelets: 265 10*3/uL (ref 150–400)
RBC: 4.55 MIL/uL (ref 3.87–5.11)
RDW: 12.5 % (ref 11.5–15.5)
WBC: 5.6 10*3/uL (ref 4.0–10.5)
nRBC: 0 % (ref 0.0–0.2)

## 2021-07-17 LAB — COMPREHENSIVE METABOLIC PANEL
ALT: 15 U/L (ref 0–44)
AST: 17 U/L (ref 15–41)
Albumin: 4.2 g/dL (ref 3.5–5.0)
Alkaline Phosphatase: 77 U/L (ref 38–126)
Anion gap: 9 (ref 5–15)
BUN: 15 mg/dL (ref 6–20)
CO2: 28 mmol/L (ref 22–32)
Calcium: 9 mg/dL (ref 8.9–10.3)
Chloride: 104 mmol/L (ref 98–111)
Creatinine, Ser: 0.92 mg/dL (ref 0.44–1.00)
GFR, Estimated: >60 mL/min (ref >60)
Glucose, Bld: 116 mg/dL — ABNORMAL HIGH (ref 70–99)
Potassium: 3.8 mmol/L (ref 3.5–5.1)
Sodium: 141 mmol/L (ref 135–145)
Total Bilirubin: 1.2 mg/dL (ref 0.3–1.2)
Total Protein: 7.4 g/dL (ref 6.5–8.1)

## 2021-07-17 LAB — LIPASE, BLOOD: Lipase: 25 U/L (ref 11–51)

## 2021-07-17 MED ORDER — ONDANSETRON HCL 4 MG PO TABS: 4.0000 mg | ORAL_TABLET | Freq: Every day | ORAL | 0 refills | Status: DC | PRN

## 2021-07-17 MED ORDER — ONDANSETRON HCL 4 MG/2ML IJ SOLN
4.0000 mg | Freq: Once | INTRAMUSCULAR | Status: AC
  Administered 2021-07-17: 4 mg via INTRAVENOUS
  Filled 2021-07-17: qty 2

## 2021-07-17 MED ORDER — KETOROLAC TROMETHAMINE 30 MG/ML IJ SOLN
15.0000 mg | Freq: Once | INTRAMUSCULAR | Status: AC
  Administered 2021-07-17: 15 mg via INTRAVENOUS
  Filled 2021-07-17: qty 1

## 2021-07-17 MED ORDER — CEPHALEXIN 500 MG PO CAPS: 500.0000 mg | ORAL_CAPSULE | Freq: Three times a day (TID) | ORAL | 0 refills | Status: DC

## 2021-07-17 MED ORDER — SODIUM CHLORIDE 0.9 % IV BOLUS
1000.0000 mL | Freq: Once | INTRAVENOUS | Status: AC
  Administered 2021-07-17: 1000 mL via INTRAVENOUS

## 2021-07-17 NOTE — ED Provider Notes (Signed)
[facility] Provider Note    Event Date/Time   First MD Initiated Contact with Patient 07/17/21 1010     (approximate)  History   Diarrhea and Emesis  HPI  Alexis Christensen is a 51 y.o. female with a past medical history of CHF, asthma, bipolar, depression, hyperlipidemia, presents to the emergency department for nausea vomiting diarrhea.  According to the patient since yesterday evening she has been experiencing nausea vomiting diarrhea.  States she is here with her family member who has similar symptoms as well.  Patient denies any abdominal pain.  Denies any fever cough or congestion.  Physical Exam   Triage Vital Signs: ED Triage Vitals [07/17/21 0908]  Enc Vitals Group     BP 107/69     Pulse Rate 76     Resp 18     Temp 98.7 F (37.1 C)     Temp Source Oral     SpO2 93 %     Weight 225 lb (102.1 kg)     Height 5\' 4"  (1.626 m)     Head Circumference      Peak Flow      Pain Score 0     Pain Loc      Pain Edu?      Excl. in [place]?     Most recent vital signs: Vitals:   07/17/21 0908  BP: 107/69  Pulse: 76  Resp: 18  Temp: 98.7 F (37.1 C)  SpO2: 93%    General: Awake, no distress.  CV:  Good peripheral perfusion.  Regular rate and rhythm around 80 bpm. Resp:  Normal effort.  Equal breath sounds bilaterally.  Without wheeze rales or rhonchi Abd:  No distention.  Soft, nontender.  No rebound or guarding.  No reaction to palpation.    ED Results / Procedures / Treatments   MEDICATIONS ORDERED IN ED:  Zofran, Toradol, normal saline bolus   IMPRESSION / MDM / ASSESSMENT AND PLAN / ED COURSE  I reviewed the triage vital signs and the nursing notes.  Patient presents emergency department for nausea vomiting diarrhea since last night.  Patient's family ember is here with similar symptoms as well.  Highly suspect gastroenteritis to be the cause of the patient's symptoms.  Differential would also include viral illnesses such as COVID or  influenza as well as gastritis, colitis or less likely diverticulitis or other intra-abdominal pathology.  Patient's lab work is largely [place].  Reassuringly normal white blood cell count.  Patient does have greater than 50 white blood cells in her urine but no bacteria seen.  We will cover with antibiotics as a precaution for possible urinary tract infection and send a urine culture.  We will place an IV and treat the patient with 1 L of normal saline, Zofran and Toradol to help with symptom management.  Does not appear to require admission to the hospital, will likely discharge after fluids and medications.  Patient states she is feeling much better after fluids and medication.  Patient's urinalysis does show greater than 50 white blood cells.  Possibly indicative of a urinary tract infection.  We will place the patient on antibiotics.  COVID and flu test is pending.  Patient will follow-up with MyChart for the results.  Patient agreeable to plan of care.   FINAL CLINICAL IMPRESSION(S) / ED DIAGNOSES   Viral gastroenteritis   Rx / DC Orders   Zofran for nausea Keflex for UTI   Note:  This  document was prepared using Systems analyst and may include unintentional dictation errors.   Harvest Dark, MD 07/17/21 [phone]

## 2021-07-17 NOTE — ED Triage Notes (Signed)
Pt reports diarrhea and emesis since yesterday afternoon. NAD noted. Ambulatory.

## 2021-07-18 ENCOUNTER — Encounter (INDEPENDENT_AMBULATORY_CARE_PROVIDER_SITE_OTHER): Payer: Self-pay | Admitting: Family Medicine

## 2021-07-18 ENCOUNTER — Telehealth (INDEPENDENT_AMBULATORY_CARE_PROVIDER_SITE_OTHER): Payer: Commercial Managed Care - HMO | Admitting: Family Medicine

## 2021-07-18 ENCOUNTER — Encounter: Payer: Self-pay | Admitting: Oncology

## 2021-07-18 DIAGNOSIS — Z9189 Other specified personal risk factors, not elsewhere classified: Secondary | ICD-10-CM

## 2021-07-18 DIAGNOSIS — R7303 Prediabetes: Secondary | ICD-10-CM | POA: Diagnosis not present

## 2021-07-18 DIAGNOSIS — Z6838 Body mass index (BMI) 38.0-38.9, adult: Secondary | ICD-10-CM | POA: Diagnosis not present

## 2021-07-18 DIAGNOSIS — E559 Vitamin D deficiency, unspecified: Secondary | ICD-10-CM | POA: Diagnosis not present

## 2021-07-18 MED ORDER — METFORMIN HCL 500 MG PO TABS: ORAL_TABLET | ORAL | 0 refills | Status: DC

## 2021-07-18 MED ORDER — VITAMIN D (ERGOCALCIFEROL) 1.25 MG (50000 UNIT) PO CAPS: 50000.0000 [IU] | ORAL_CAPSULE | ORAL | 0 refills | Status: DC

## 2021-07-20 NOTE — Progress Notes (Signed)
TeleHealth Visit:  Due to the COVID-19 pandemic, this visit was completed with telemedicine (audio/video) technology to reduce patient and provider exposure as well as to preserve personal protective equipment.   Alexis Christensen has verbally consented to this TeleHealth visit. The patient is located at home, the provider is located at the [facility] office. The participants in this visit include the listed provider and patient. The visit was conducted today via MyChart Video.   Chief Complaint: OBESITY Alexis Christensen is here to discuss her progress with her obesity treatment plan along with follow-up of her obesity related diagnoses. Alexis Christensen is on the Category 1 Plan and states she is following her eating plan approximately 90% of the time. Alexis Christensen states she is doing 0 minutes 0 times per week.  Today's visit was #: 7 Starting weight: 229 lbs Starting date: 03/28/2021  Interim History: Alexis Christensen notes nausea and diarrhea that started 2 days ago. She notes head congestion, but no fever and she notes feeling very tired. Her COVID test was positive, and she was in the emergency room yesterday for fluids and supportive care.   Subjective:   1. Pre-diabetes Alexis Christensen's A1c was 6.0 four months ago. Per the patient, her recent labs were showing her A1c was 5.6 she thinks. She is tolerating metformin well with no side effects, even with her current illness.  2. Vitamin D deficiency Alexis Christensen is currently taking prescription vitamin D 50,000 IU each week. She denies nausea, vomiting or muscle weakness.  3. At risk for malnutrition Alexis Christensen is at increased risk for malnutrition.  Assessment/Plan:  No orders of the defined types were placed in this encounter.   Medications Discontinued During This Encounter  Medication Reason   metFORMIN (GLUCOPHAGE) 500 MG tablet Reorder   Vitamin D, Ergocalciferol, (DRISDOL) 1.25 MG (50000 UNIT) CAPS capsule Reorder     Meds ordered this encounter  Medications   metFORMIN  (GLUCOPHAGE) 500 MG tablet    Sig: 1 po with lunch daily    Dispense:  30 tablet    Refill:  0   Vitamin D, Ergocalciferol, (DRISDOL) 1.25 MG (50000 UNIT) CAPS capsule    Sig: Take 1 capsule (50,000 Units total) by mouth every 7 (seven) days.    Dispense:  4 capsule    Refill:  0    30 d supply;  ** OV for RF **   Do not send RF request     1. Pre-diabetes We will refill metformin for 1 month. Alexis Christensen will bring in all of her recent labs from her primary care physician and specialists to her next office visit. We will need to recheck labs that were not done recently.  - metFORMIN (GLUCOPHAGE) 500 MG tablet; 1 po with lunch daily  Dispense: 30 tablet; Refill: 0  2. Vitamin D deficiency We will refill prescription Vitamin D for 1 month. Alexis Christensen will follow-up for routine testing of Vitamin D, at least 2-3 times per year to avoid over-replacement.  - Vitamin D, Ergocalciferol, (DRISDOL) 1.25 MG (50000 UNIT) CAPS capsule; Take 1 capsule (50,000 Units total) by mouth every 7 (seven) days.  Dispense: 4 capsule; Refill: 0  3. At risk for malnutrition Alexis Christensen was given approximately 9 minutes of counseling today regarding prevention of malnutrition and ways to meet macronutrient goals..   4. Obesity with current BMI of 38.62 Alexis Christensen is currently in the action stage of change. As such, her goal is to continue with weight loss efforts. She has agreed to the Category 1  Plan.   Alexis Christensen is to come fasting for labs to her next office visit.  Exercise goals: As is.  Behavioral modification strategies: avoiding temptations and planning for success.  Alexis Christensen has agreed to follow-up with our clinic in 2 weeks. She was informed of the importance of frequent follow-up visits to maximize her success with intensive lifestyle modifications for her multiple health conditions.  Objective:   VITALS: Per patient if applicable, see vitals. GENERAL: Alert and in no acute distress. CARDIOPULMONARY: No increased WOB. Speaking  in clear sentences.  PSYCH: Pleasant and cooperative. Speech normal rate and rhythm. Affect is appropriate. Insight and judgement are appropriate. Attention is focused, linear, and appropriate.  NEURO: Oriented as arrived to appointment on time with no prompting.   Lab Results  Component Value Date   CREATININE 0.92 07/17/2021   BUN 15 07/17/2021   NA 141 07/17/2021   K 3.8 07/17/2021   CL 104 07/17/2021   CO2 28 07/17/2021   Lab Results  Component Value Date   ALT 15 07/17/2021   AST 17 07/17/2021   ALKPHOS 77 07/17/2021   BILITOT 1.2 07/17/2021   Lab Results  Component Value Date   HGBA1C 6.0 03/06/2021   Lab Results  Component Value Date   INSULIN 7.4 03/28/2021   Lab Results  Component Value Date   TSH 10.40 (A) 03/06/2021   Lab Results  Component Value Date   CHOL 232 (A) 03/06/2021   HDL 61 03/06/2021   LDLCALC 156 03/06/2021   TRIG 88 03/06/2021   Lab Results  Component Value Date   VD25OH 25.0 (L) 03/28/2021   Lab Results  Component Value Date   WBC 5.6 07/17/2021   HGB 13.5 07/17/2021   HCT 41.3 07/17/2021   MCV 90.8 07/17/2021   PLT 265 07/17/2021   Lab Results  Component Value Date   IRON 39 07/13/2016   TIBC 408 07/13/2016   FERRITIN 11 07/13/2016    Attestation Statements:   Reviewed by clinician on day of visit: allergies, medications, problem list, medical history, surgical history, family history, social history, and previous encounter notes.   Wilhemena Durie, am acting as transcriptionist for [employer], DO.  I have reviewed the above documentation for accuracy and completeness, and I agree with the above. Marjory Sneddon, D.O.  The [place] was signed into law in 2016 which includes the topic of electronic health records.  This provides immediate access to information in MyChart.  This includes consultation notes, operative notes, office notes, lab results and pathology reports.  If you have any questions  about what you read please let us know at your next visit so we can discuss your concerns and take corrective action if need be.  We are right here with you.

## 2021-07-21 DIAGNOSIS — E063 Autoimmune thyroiditis: Secondary | ICD-10-CM | POA: Diagnosis not present

## 2021-07-25 DIAGNOSIS — G8929 Other chronic pain: Secondary | ICD-10-CM | POA: Diagnosis not present

## 2021-07-26 DIAGNOSIS — E063 Autoimmune thyroiditis: Secondary | ICD-10-CM | POA: Diagnosis not present

## 2021-07-26 DIAGNOSIS — E042 Nontoxic multinodular goiter: Secondary | ICD-10-CM | POA: Diagnosis not present

## 2021-07-28 DIAGNOSIS — F17200 Nicotine dependence, unspecified, uncomplicated: Secondary | ICD-10-CM | POA: Diagnosis not present

## 2021-08-01 ENCOUNTER — Encounter (INDEPENDENT_AMBULATORY_CARE_PROVIDER_SITE_OTHER): Payer: Self-pay

## 2021-08-01 ENCOUNTER — Encounter (INDEPENDENT_AMBULATORY_CARE_PROVIDER_SITE_OTHER): Payer: Self-pay | Admitting: Family Medicine

## 2021-08-01 ENCOUNTER — Ambulatory Visit (INDEPENDENT_AMBULATORY_CARE_PROVIDER_SITE_OTHER): Payer: Commercial Managed Care - HMO | Admitting: Family Medicine

## 2021-08-01 ENCOUNTER — Other Ambulatory Visit: Payer: Self-pay

## 2021-08-01 VITALS — BP 107/70 | HR 58 | Temp 97.9°F | Ht 64.0 in | Wt 215.0 lb

## 2021-08-01 DIAGNOSIS — E038 Other specified hypothyroidism: Secondary | ICD-10-CM

## 2021-08-01 DIAGNOSIS — E559 Vitamin D deficiency, unspecified: Secondary | ICD-10-CM

## 2021-08-01 DIAGNOSIS — E669 Obesity, unspecified: Secondary | ICD-10-CM | POA: Diagnosis not present

## 2021-08-01 DIAGNOSIS — T887XXA Unspecified adverse effect of drug or medicament, initial encounter: Secondary | ICD-10-CM | POA: Diagnosis not present

## 2021-08-01 DIAGNOSIS — R7303 Prediabetes: Secondary | ICD-10-CM

## 2021-08-01 DIAGNOSIS — Z6836 Body mass index (BMI) 36.0-36.9, adult: Secondary | ICD-10-CM

## 2021-08-01 DIAGNOSIS — Z9189 Other specified personal risk factors, not elsewhere classified: Secondary | ICD-10-CM

## 2021-08-01 MED ORDER — METFORMIN HCL 500 MG PO TABS: ORAL_TABLET | ORAL | 0 refills | Status: DC

## 2021-08-01 MED ORDER — VITAMIN D (ERGOCALCIFEROL) 1.25 MG (50000 UNIT) PO CAPS: 50000.0000 [IU] | ORAL_CAPSULE | ORAL | 0 refills | Status: DC

## 2021-08-01 NOTE — Progress Notes (Signed)
Chief Complaint:   OBESITY Alexis Christensen is here to discuss her progress with her obesity treatment plan along with follow-up of her obesity related diagnoses. Alexis Christensen is on the Category 1 Plan and states she is following her eating plan approximately 85-90% of the time. Alexis Christensen states she is not currently exercising..  Today's visit was #: 8 Starting weight: 229 lbs Starting date: 03/28/2021 Today's weight: 215 lbs Today's date: 08/01/2021 Total lbs lost to date: 14 Total lbs lost since last in-office visit: 10  Interim History: Alexis Christensen last in-office visit was 06/06/2021. She has lost 10 lbs since then by eating the plan. She denies hunger or cravings. She had labs with [facility] on 06/20/2021, CBC, CMP, FLP; showed elevated LDL to 141, HDL 61, A1c 5.7 (prior 6.0 four months ago), and TSH 8.660.  Subjective:   1. Pre-diabetes Alexis Christensen is taking metformin and she is tolerating it well, and she notes it helps with carbohydrate cravings. She has decreased snacks since being on it. She is tolerating it well with no side effects or concerns. Her A1c has improved from 6.0 to 5.7 one month ago. I discussed labs with the patient today.  2. Other specified hypothyroidism Alexis Christensen was recently seen on 07/26/2021 by her Endocrinologist at [facility]. They increased her dose of her thyroid medications t 175 mcg. She had been taking 150 mcg consistently for several months. She feels good with no concerns.  3. Vitamin D deficiency Alexis Christensen is tolerating medication(s) well without side effects.  Medication compliance is good and patient appears to be taking it as prescribed.  Denies additional concerns regarding this condition.   4. Rule out side effect of drug Alexis Christensen has been taking metformin for 3-4 months now. She has no issues or concerns, but she is tired a lot.   5. At risk for side effect of medication Alexis Christensen is at risk for drug side effects due to metformin use, which can cause magnesium and other  deficiencies.  Assessment/Plan:   Orders Placed This Encounter  Procedures   Magnesium   Insulin, random   VITAMIN D 25 Hydroxy (Vit-D Deficiency, Fractures)   Vitamin B12    Medications Discontinued During This Encounter  Medication Reason   cephALEXin (KEFLEX) 500 MG capsule    metFORMIN (GLUCOPHAGE) 500 MG tablet Reorder   Vitamin D, Ergocalciferol, (DRISDOL) 1.25 MG (50000 UNIT) CAPS capsule Reorder   rosuvastatin (CRESTOR) 20 MG tablet      Meds ordered this encounter  Medications   metFORMIN (GLUCOPHAGE) 500 MG tablet    Sig: 1 po with lunch daily    Dispense:  30 tablet    Refill:  0    30 d supply;  ** OV for RF **   Do not send RF request   Vitamin D, Ergocalciferol, (DRISDOL) 1.25 MG (50000 UNIT) CAPS capsule    Sig: Take 1 capsule (50,000 Units total) by mouth every 7 (seven) days.    Dispense:  4 capsule    Refill:  0    30 d supply;  ** OV for RF **   Do not send RF request     1. Pre-diabetes We will check labs today, and we will refill metformin for 1 month. Alexis Christensen will continue to work on weight loss, exercise, and decreasing simple carbohydrates to help decrease the risk of diabetes.   - metFORMIN (GLUCOPHAGE) 500 MG tablet; 1 po with lunch daily  Dispense: 30 tablet; Refill: 0 - Insulin, random  2. Other specified hypothyroidism Alexis Christensen has follow-up labs and thyroid ultrasound scheduled in 3-4 months with Endocrinology. Orders and follow up as documented in patient record.  Counseling Good thyroid control is important for overall health. Supratherapeutic thyroid levels are dangerous and will not improve weight loss results. Counseling: The correct way to take levothyroxine is fasting, with water, separated by at least 30 minutes from breakfast, and separated by more than 4 hours from calcium, iron, multivitamins, acid reflux medications (PPIs).   3. Vitamin D deficiency Low Vitamin D level contributes to fatigue and are associated with obesity, breast,  and colon cancer. We will check labs today, and we will refill prescription Vitamin D for 1 month. Alexis Christensen will follow-up for routine testing of Vitamin D, at least 2-3 times per year to avoid over-replacement.  - Vitamin D, Ergocalciferol, (DRISDOL) 1.25 MG (50000 UNIT) CAPS capsule; Take 1 capsule (50,000 Units total) by mouth every 7 (seven) days.  Dispense: 4 capsule; Refill: 0 - VITAMIN D 25 Hydroxy (Vit-D Deficiency, Fractures)  4. Rule out side effect of drug We will check B12 and magnesium levels since she is on metformin, and does have fatigue, tiredness. This is likely from her hypothyroidism as well.  - Magnesium - Vitamin B12  5. At risk for side effect of medication Alexis Christensen was given approximately 15 minutes of drug side effect counseling today.  We discussed side effect possibility and risk versus benefits. Alexis Christensen agreed to the medication and will contact this office if these side effects are intolerable.  Repetitive spaced learning was employed today to elicit superior memory formation and behavioral change.  6. Obesity with current BMI of 36.9 Lillyn is currently in the action stage of change. As such, her goal is to continue with weight loss efforts. She has agreed to the Category 1 Plan.   Exercise goals: Alexis Christensen will walk for 30 minutes to goal of 3 days per week.  Behavioral modification strategies: increasing lean protein intake, decreasing simple carbohydrates, and planning for success.  Alexis Christensen has agreed to follow-up with our clinic in 3 weeks. She was informed of the importance of frequent follow-up visits to maximize her success with intensive lifestyle modifications for her multiple health conditions.   Alexis Christensen was informed we would discuss her lab results at her next visit unless there is a critical issue that needs to be addressed sooner. Alexis Christensen agreed to keep her next visit at the agreed upon time to discuss these results.  Objective:   Blood pressure 107/70, pulse (!) 58,  temperature 97.9 F (36.6 C), height 5\' 4"  (1.626 m), weight 215 lb (97.5 kg), SpO2 95 %. Body mass index is 36.9 kg/m.  General: Cooperative, alert, well developed, in no acute distress. HEENT: Conjunctivae and lids unremarkable. Cardiovascular: Regular rhythm.  Lungs: Normal work of breathing. Neurologic: No focal deficits.   Lab Results  Component Value Date   CREATININE 0.92 07/17/2021   BUN 15 07/17/2021   NA 141 07/17/2021   K 3.8 07/17/2021   CL 104 07/17/2021   CO2 28 07/17/2021   Lab Results  Component Value Date   ALT 15 07/17/2021   AST 17 07/17/2021   ALKPHOS 77 07/17/2021   BILITOT 1.2 07/17/2021   Lab Results  Component Value Date   HGBA1C 5.7 06/20/2021   HGBA1C 6.0 03/06/2021   Lab Results  Component Value Date   INSULIN 7.4 03/28/2021   Lab Results  Component Value Date   TSH 8.66 (A) 06/20/2021   Lab  Results  Component Value Date   CHOL 216 (A) 06/20/2021   HDL 61 06/20/2021   LDLCALC 141 06/20/2021   TRIG 80 06/20/2021   Lab Results  Component Value Date   VD25OH 25.0 (L) 03/28/2021   Lab Results  Component Value Date   WBC 5.6 07/17/2021   HGB 13.5 07/17/2021   HCT 41.3 07/17/2021   MCV 90.8 07/17/2021   PLT 265 07/17/2021   Lab Results  Component Value Date   IRON 39 07/13/2016   TIBC 408 07/13/2016   FERRITIN 11 07/13/2016   Attestation Statements:   Reviewed by clinician on day of visit: allergies, medications, problem list, medical history, surgical history, family history, social history, and previous encounter notes.   Wilhemena Durie, am acting as transcriptionist for [employer], DO.  I have reviewed the above documentation for accuracy and completeness, and I agree with the above. Marjory Sneddon, D.O.  The [place] was signed into law in 2016 which includes the topic of electronic health records.  This provides immediate access to information in MyChart.  This includes consultation notes,  operative notes, office notes, lab results and pathology reports.  If you have any questions about what you read please let us know at your next visit so we can discuss your concerns and take corrective action if need be.  We are right here with you.

## 2021-08-02 LAB — MAGNESIUM: Magnesium: 2.1 mg/dL (ref 1.6–2.3)

## 2021-08-02 LAB — VITAMIN D 25 HYDROXY (VIT D DEFICIENCY, FRACTURES): Vit D, 25-Hydroxy: 25.7 ng/mL — ABNORMAL LOW (ref 30.0–100.0)

## 2021-08-02 LAB — VITAMIN B12: Vitamin B-12: 342 pg/mL (ref 232–1245)

## 2021-08-02 LAB — INSULIN, RANDOM: INSULIN: 6.4 u[IU]/mL (ref 2.6–24.9)

## 2021-08-07 ENCOUNTER — Encounter: Payer: Self-pay | Admitting: Oncology

## 2021-08-08 ENCOUNTER — Encounter: Payer: Self-pay | Admitting: Oncology

## 2021-08-15 ENCOUNTER — Ambulatory Visit (INDEPENDENT_AMBULATORY_CARE_PROVIDER_SITE_OTHER): Payer: Medicare Other | Admitting: Family Medicine

## 2021-08-15 ENCOUNTER — Encounter (INDEPENDENT_AMBULATORY_CARE_PROVIDER_SITE_OTHER): Payer: Self-pay | Admitting: Family Medicine

## 2021-08-15 ENCOUNTER — Other Ambulatory Visit: Payer: Self-pay

## 2021-08-15 VITALS — BP 104/69 | HR 51 | Temp 97.8°F | Ht 64.0 in | Wt 214.0 lb

## 2021-08-15 DIAGNOSIS — Z6839 Body mass index (BMI) 39.0-39.9, adult: Secondary | ICD-10-CM

## 2021-08-15 DIAGNOSIS — E559 Vitamin D deficiency, unspecified: Secondary | ICD-10-CM | POA: Diagnosis not present

## 2021-08-15 DIAGNOSIS — R7303 Prediabetes: Secondary | ICD-10-CM | POA: Diagnosis not present

## 2021-08-15 DIAGNOSIS — Z6836 Body mass index (BMI) 36.0-36.9, adult: Secondary | ICD-10-CM | POA: Diagnosis not present

## 2021-08-15 DIAGNOSIS — E538 Deficiency of other specified B group vitamins: Secondary | ICD-10-CM

## 2021-08-15 DIAGNOSIS — E669 Obesity, unspecified: Secondary | ICD-10-CM

## 2021-08-15 MED ORDER — VITAMIN D (ERGOCALCIFEROL) 1.25 MG (50000 UNIT) PO CAPS: 50000.0000 [IU] | ORAL_CAPSULE | ORAL | 0 refills | Status: DC

## 2021-08-15 MED ORDER — CYANOCOBALAMIN 500 MCG PO TABS: 500.0000 ug | ORAL_TABLET | Freq: Every day | ORAL | Status: DC

## 2021-08-15 MED ORDER — METFORMIN HCL 500 MG PO TABS: ORAL_TABLET | ORAL | 0 refills | Status: DC

## 2021-08-15 MED ORDER — VITAMIN D3 125 MCG (5000 UT) PO TABS: ORAL_TABLET | ORAL | 3 refills | Status: DC

## 2021-08-16 NOTE — Progress Notes (Signed)
Chief Complaint:   OBESITY Alexis Christensen is here to discuss her progress with her obesity treatment plan along with follow-up of her obesity related diagnoses. Alexis Christensen is on the Category 1 Plan and states she is following her eating plan approximately 85% of the time. Alexis Christensen states she is walking for 30 minutes 2 times per week.  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  She was stressed about finding a job prior and she did more emotional eating than normal, but that has improved now. - She is snacking on crackers, cookies, and gummy bears, and she is not weighing or measuring those snack amounts.  - She is here to review labs results that were recently drawn (B12, magnesium, Vit D, and fasting insulin).   Subjective:   1. B12 deficiency Alexis Christensen notes some tiredness/fatigue but nothing unusual. Her B12 level has improved but is still on the low side, and is not at goal. I discussed labs with the patient today.  2. Pre-diabetes Alexis Christensen increased snacking lately likely due to increased stress, but she feels increasing her metformin dose would help.  Her magnesium is within normal limits on her recent labs.  I discussed labs with the patient today.  3. Vitamin D deficiency Alexis Christensen's Vitamin level is still not at goal at 25.7.   She is tolerating medication(s) well without side effects.  Medication compliance is good and patient appears to be taking it as prescribed.  Denies additional concerns regarding this condition.   I discussed labs with the patient today.    Assessment/Plan:   Medications Discontinued During This Encounter  Medication Reason   metFORMIN (GLUCOPHAGE) 500 MG tablet Reorder   Vitamin D, Ergocalciferol, (DRISDOL) 1.25 MG (50000 UNIT)  CAPS capsule Reorder     Meds ordered this encounter  Medications   metFORMIN (GLUCOPHAGE) 500 MG tablet    Sig: 1 po with lunch and 1 po with dinner QD    Dispense:  60 tablet    Refill:  0    30 d supply;  ** OV for RF **   Do not send RF request   Vitamin D, Ergocalciferol, (DRISDOL) 1.25 MG (50000 UNIT) CAPS capsule    Sig: Take 1 capsule (50,000 Units total) by mouth every 7 (seven) days.    Dispense:  4 capsule    Refill:  0    30 d supply;  ** OV for RF **   Do not send RF request   Cholecalciferol (VITAMIN D3) 125 MCG (5000 UT) TABS    Sig: 5,000 IU OTC vitamin D3 daily.( In addition to [employer] of vit D)    Dispense:  90 tablet    Refill:  3   vitamin B-12 (CYANOCOBALAMIN) 500 MCG tablet    Sig: Take 1 tablet (500 mcg total) by mouth daily.     1. B12 deficiency Alexis Christensen agreed to start Vitamin B12 400-500 mcg daily OTC. She will continue her prudent nutritional plan. We recheck labs in 3 months or so.  - vitamin B-12 (CYANOCOBALAMIN) 500 MCG tablet; Take 1 tablet (500 mcg total) by mouth daily.   2. Pre-diabetes Alexis Christensen agreed to increase metformin to 500 mg BID (up from once daily). Risk and benefits on the  medication were discussed. She will also start B12 supplementation.  - metFORMIN (GLUCOPHAGE) 500 MG tablet; 1 po with lunch and 1 po with dinner QD  Dispense: 60 tablet; Refill: 0   3. Vitamin D deficiency We will refill prescription Vitamin D 50,000 IU every week for 1 month. -  In ADDITION, Alexis Christensen agreed to start OTC Vitamin D3 5,000 IU daily, with a 90 days supply with 3 refills. -  I wrote down instructions for the patient to follow.  - Counseling was done, and all questions were answered.   - Vitamin D, Ergocalciferol, (DRISDOL) 1.25 MG (50000 UNIT) CAPS capsule; Take 1 capsule (50,000 Units total) by mouth every 7 (seven) days.  Dispense: 4 capsule; Refill: 0 - Cholecalciferol (VITAMIN D3) 125 MCG (5000 UT) TABS; 5,000 IU OTC vitamin D3 daily.( In addition to  [employer] of vit D)  Dispense: 90 tablet; Refill: 3   4. Obesity with current BMI of 36.8 Alexis Christensen is currently in the action stage of change. As such, her goal is to continue with weight loss efforts. She has agreed to the Category 1 Plan.   Exercise goals: Increase her exercise to 30 minutes 3-4 times per week.  Behavioral modification strategies: As is.Alexis Christensen has agreed to follow-up with our clinic in 2 to 3 weeks. She was informed of the importance of frequent follow-up visits to maximize her success with intensive lifestyle modifications for her multiple health conditions.   Objective:   Blood pressure 104/69, pulse (!) 51, temperature 97.8 F (36.6 C), height 5\' 4"  (1.626 m), weight 214 lb (97.1 kg), SpO2 98 %. Body mass index is 36.73 kg/m.  General: Cooperative, alert, well developed, in no acute distress. HEENT: Conjunctivae and lids unremarkable. Cardiovascular: Regular rhythm.  Lungs: Normal work of breathing. Neurologic: No focal deficits.   Lab Results  Component Value Date   CREATININE 0.92 07/17/2021   BUN 15 07/17/2021   NA 141 07/17/2021   K 3.8 07/17/2021   CL 104 07/17/2021   CO2 28 07/17/2021   Lab Results  Component Value Date   ALT 15 07/17/2021   AST 17 07/17/2021   ALKPHOS 77 07/17/2021   BILITOT 1.2 07/17/2021   Lab Results  Component Value Date   HGBA1C 5.7 06/20/2021   HGBA1C 6.0 03/06/2021   Lab Results  Component Value Date   INSULIN 6.4 08/01/2021   INSULIN 7.4 03/28/2021   Lab Results  Component Value Date   TSH 8.66 (A) 06/20/2021   Lab Results  Component Value Date   CHOL 216 (A) 06/20/2021   HDL 61 06/20/2021   LDLCALC 141 06/20/2021   TRIG 80 06/20/2021   Lab Results  Component Value Date   VD25OH 25.7 (L) 08/01/2021   VD25OH 25.0 (L) 03/28/2021   Lab Results  Component Value Date   WBC 5.6 07/17/2021   HGB 13.5 07/17/2021   HCT 41.3 07/17/2021   MCV 90.8 07/17/2021   PLT 265 07/17/2021   Lab Results   Component Value Date   IRON 39 07/13/2016   TIBC 408 07/13/2016   FERRITIN 11 07/13/2016    Obesity Behavioral Intervention:   Approximately 15 minutes were spent on the discussion below.  ASK: We discussed the diagnosis of obesity with Alexis Christensen today and Alexis Christensen agreed to give [name] permission to discuss obesity behavioral modification therapy today.  ASSESS: Alexis Christensen has the diagnosis of obesity and her BMI today is 36.8. Alexis Christensen is in the action stage of change.  ADVISE: Alexis Christensen was educated on the multiple health risks of obesity as well as the benefit of weight loss to improve her health. She was advised of the need for long term treatment and the importance of lifestyle modifications to improve her current health and to decrease her risk of future health problems.  AGREE: Multiple dietary modification options and treatment options were discussed and Alexis Christensen agreed to follow the recommendations documented in the above note.  ARRANGE: Alexis Christensen was educated on the importance of frequent visits to treat obesity as outlined per CMS and USPSTF guidelines and agreed to schedule her next follow up appointment today.  Attestation Statements:   Reviewed by clinician on day of visit: allergies, medications, problem list, medical history, surgical history, family history, social history, and previous encounter notes.   Wilhemena Durie, am acting as transcriptionist for [employer], DO.  I have reviewed the above documentation for accuracy and completeness, and I agree with the above. Marjory Sneddon, D.O.  The [place] was signed into law in 2016 which includes the topic of electronic health records.  This provides immediate access to information in MyChart.  This includes consultation notes, operative notes, office notes, lab results and pathology reports.  If you have any questions about what you read please let us know at your next visit so we can discuss your concerns and take  corrective action if need be.  We are right here with you.

## 2021-08-22 DIAGNOSIS — Z79899 Other long term (current) drug therapy: Secondary | ICD-10-CM | POA: Diagnosis not present

## 2021-08-22 DIAGNOSIS — G8929 Other chronic pain: Secondary | ICD-10-CM | POA: Diagnosis not present

## 2021-08-23 ENCOUNTER — Encounter: Payer: Self-pay | Admitting: Oncology

## 2021-08-27 ENCOUNTER — Other Ambulatory Visit: Payer: Self-pay

## 2021-08-27 ENCOUNTER — Emergency Department
Admission: EM | Admit: 2021-08-27 | Discharge: 2021-08-27 | Disposition: A | Discharge status: Home / Self Care | Payer: Medicare Other | Attending: Emergency Medicine | Admitting: Emergency Medicine

## 2021-08-27 DIAGNOSIS — N189 Chronic kidney disease, unspecified: Secondary | ICD-10-CM | POA: Diagnosis not present

## 2021-08-27 DIAGNOSIS — N390 Urinary tract infection, site not specified: Secondary | ICD-10-CM | POA: Diagnosis not present

## 2021-08-27 DIAGNOSIS — N3000 Acute cystitis without hematuria: Secondary | ICD-10-CM | POA: Diagnosis not present

## 2021-08-27 DIAGNOSIS — I509 Heart failure, unspecified: Secondary | ICD-10-CM | POA: Insufficient documentation

## 2021-08-27 DIAGNOSIS — J45909 Unspecified asthma, uncomplicated: Secondary | ICD-10-CM | POA: Insufficient documentation

## 2021-08-27 DIAGNOSIS — R0981 Nasal congestion: Secondary | ICD-10-CM | POA: Insufficient documentation

## 2021-08-27 DIAGNOSIS — R350 Frequency of micturition: Secondary | ICD-10-CM | POA: Diagnosis present

## 2021-08-27 LAB — URINALYSIS, ROUTINE W REFLEX MICROSCOPIC
Bacteria, UA: NONE SEEN
Bilirubin Urine: NEGATIVE
Glucose, UA: NEGATIVE mg/dL
Hgb urine dipstick: NEGATIVE
Ketones, ur: NEGATIVE mg/dL
Nitrite: NEGATIVE
Protein, ur: NEGATIVE mg/dL
Specific Gravity, Urine: 1.008 (ref 1.005–1.030)
pH: 8 (ref 5.0–8.0)

## 2021-08-27 MED ORDER — CEPHALEXIN 500 MG PO CAPS: 500.0000 mg | ORAL_CAPSULE | Freq: Three times a day (TID) | ORAL | 0 refills | Status: DC

## 2021-08-27 MED ORDER — PHENAZOPYRIDINE HCL 100 MG PO TABS: 100.0000 mg | ORAL_TABLET | Freq: Three times a day (TID) | ORAL | 0 refills | Status: DC

## 2021-08-27 NOTE — ED Triage Notes (Signed)
Pt comes pov with nasal congestion and UTI symptoms for about 3 days. Lower back started hurting this morning.

## 2021-08-27 NOTE — ED Provider Notes (Signed)
[facility] Provider Note    Event Date/Time   First MD Initiated Contact with Patient 08/27/21 [phone]     (approximate)   History   Urinary Tract Infection and Nasal Congestion   HPI  Alexis Christensen is a 51 y.o. female   presents to the ED with complaint of urinary frequency and burning.  Patient states symptoms started approximately 3 days ago and today she has low back pain with this.  Patient has a history of urinary tract infections, chronic kidney disease, ADHD, anxiety, asthma, bipolar 1 disorder, CHF, GERD, kidney stones and PTSD.  She also complains of some nasal congestion.  She denies any fever, nausea or vomiting.  She rates her pain as an 8 out of 10.      Physical Exam   Triage Vital Signs: ED Triage Vitals  Enc Vitals Group     BP 08/27/21 0920 130/77     Pulse Rate 08/27/21 0920 74     Resp 08/27/21 0920 18     Temp 08/27/21 0920 98.1 F (36.7 C)     Temp Source 08/27/21 0920 Oral     SpO2 08/27/21 0920 99 %     Weight 08/27/21 0916 220 lb (99.8 kg)     Height 08/27/21 0916 5\' 4"  (1.626 m)     Head Circumference --      Peak Flow --      Pain Score 08/27/21 0916 8     Pain Loc --      Pain Edu? --      Excl. in [place]? --     Most recent vital signs: Vitals:   08/27/21 0920  BP: 130/77  Pulse: 74  Resp: 18  Temp: 98.1 F (36.7 C)  SpO2: 99%     General: Awake, no distress.  Mild congestion. CV:  Good peripheral perfusion.  Heart regular rate and rhythm without murmur. Resp:  Normal effort.  Clear bilaterally. Abd:  No distention.  Soft, nontender, bowel sounds normoactive x4 quadrants. Other:  No CVA tenderness on percussion, no point tenderness on palpation of the lumbar spine however bilateral paravertebral muscles produced tenderness which is where patient is having pain.  Patient is ambulatory without any assistance.   ED Results / Procedures / Treatments   Labs (all labs ordered are listed, but only abnormal  results are displayed) Labs Reviewed  URINALYSIS, ROUTINE W REFLEX MICROSCOPIC - Abnormal; Notable for the following components:      Result Value   Color, Urine STRAW (*)    APPearance CLEAR (*)    Leukocytes,Ua MODERATE (*)    All other components within normal limits     PROCEDURES:  Critical Care performed:   Procedures   MEDICATIONS ORDERED IN ED: Medications - No data to display   IMPRESSION / MDM / [place] / ED COURSE  I reviewed the triage vital signs and the nursing notes.   Differential diagnosis includes, but is not limited to, lumbosacral pain, lumbar strain, UTI, kidney stone, degenerative disc disease, acute cystitis.  51 year old female presents to the ED with complaint of urinary frequency and burning that started 3 days ago.  Patient also complains of low back pain but on exam there is no CVA tenderness and she is actually mildly tender in the lower sacral area.  Patient is amatory without any assistance and no difficulty with range of motion.  Urinalysis is consistent with a UTI with moderate leukocytes and 6-10 WBCs.  Patient has a history of UTIs.  She is encouraged to follow-up with her PCP for recheck of her urine.  A prescription for Keflex 5 mg 3 times daily for 10 days and Pyridium 100 mg 3 times daily with food #12.  She is encouraged to increase fluids to stay hydrated.  She was also made aware that the Pyridium could cause discoloration of her urine which is temporary.   FINAL CLINICAL IMPRESSION(S) / ED DIAGNOSES   Final diagnoses:  Acute cystitis without hematuria     Rx / DC Orders   ED Discharge Orders          Ordered    cephALEXin (KEFLEX) 500 MG capsule  3 times daily        08/27/21 1022    phenazopyridine (PYRIDIUM) 100 MG tablet  3 times daily with meals        08/27/21 1022             Note:  This document was prepared using Dragon voice recognition software and may include unintentional dictation errors.    Johnn Hai, PA-C 08/27/21 1039    Carrie Mew, MD 08/29/21 [phone]

## 2021-08-27 NOTE — Discharge Instructions (Addendum)
Follow-up with your primary care provider if any continued problems or concerns.  Increase fluids.  Take antibiotics until completely finished.  You may take Tylenol or ibuprofen as needed for low back pain.  The antibiotic and medication to decrease frequency and pain with urination was sent to your pharmacy.  This medication may turn your urine a bright yellowish-orange which is the medication and not your urine.  This is temporary.  Return to the emergency department if any severe worsening of your symptoms such as fever, chills, nausea, vomiting or inability to take the antibiotic.

## 2021-08-27 NOTE — ED Notes (Signed)
Lower back hurts. Sinus congestion.  Peeing more frequently. All for about 3 days.  Has not taken any otc or home meds for complaints.

## 2021-09-04 DIAGNOSIS — N390 Urinary tract infection, site not specified: Secondary | ICD-10-CM | POA: Diagnosis not present

## 2021-09-04 DIAGNOSIS — E785 Hyperlipidemia, unspecified: Secondary | ICD-10-CM | POA: Diagnosis not present

## 2021-09-04 DIAGNOSIS — K219 Gastro-esophageal reflux disease without esophagitis: Secondary | ICD-10-CM | POA: Diagnosis not present

## 2021-09-04 DIAGNOSIS — E039 Hypothyroidism, unspecified: Secondary | ICD-10-CM | POA: Diagnosis not present

## 2021-09-05 DIAGNOSIS — Z79899 Other long term (current) drug therapy: Secondary | ICD-10-CM | POA: Diagnosis not present

## 2021-09-14 DIAGNOSIS — R300 Dysuria: Secondary | ICD-10-CM | POA: Diagnosis not present

## 2021-09-15 ENCOUNTER — Encounter (INDEPENDENT_AMBULATORY_CARE_PROVIDER_SITE_OTHER): Payer: Self-pay | Admitting: Family Medicine

## 2021-09-15 DIAGNOSIS — R300 Dysuria: Secondary | ICD-10-CM | POA: Diagnosis not present

## 2021-09-19 ENCOUNTER — Ambulatory Visit (INDEPENDENT_AMBULATORY_CARE_PROVIDER_SITE_OTHER): Payer: Medicare Other | Admitting: Family Medicine

## 2021-09-19 DIAGNOSIS — G8929 Other chronic pain: Secondary | ICD-10-CM | POA: Diagnosis not present

## 2021-09-19 DIAGNOSIS — Z79899 Other long term (current) drug therapy: Secondary | ICD-10-CM | POA: Diagnosis not present

## 2021-09-20 ENCOUNTER — Other Ambulatory Visit (INDEPENDENT_AMBULATORY_CARE_PROVIDER_SITE_OTHER): Payer: Self-pay | Admitting: Family Medicine

## 2021-09-20 ENCOUNTER — Ambulatory Visit (INDEPENDENT_AMBULATORY_CARE_PROVIDER_SITE_OTHER): Payer: Medicare Other | Admitting: Family Medicine

## 2021-09-20 ENCOUNTER — Encounter (INDEPENDENT_AMBULATORY_CARE_PROVIDER_SITE_OTHER): Payer: Self-pay | Admitting: Family Medicine

## 2021-09-20 ENCOUNTER — Other Ambulatory Visit: Payer: Self-pay

## 2021-09-20 VITALS — BP 102/67 | HR 57 | Temp 97.4°F | Ht 64.0 in | Wt 214.0 lb

## 2021-09-20 DIAGNOSIS — E538 Deficiency of other specified B group vitamins: Secondary | ICD-10-CM | POA: Diagnosis not present

## 2021-09-20 DIAGNOSIS — R7303 Prediabetes: Secondary | ICD-10-CM | POA: Diagnosis not present

## 2021-09-20 DIAGNOSIS — Z6836 Body mass index (BMI) 36.0-36.9, adult: Secondary | ICD-10-CM | POA: Diagnosis not present

## 2021-09-20 DIAGNOSIS — Z6839 Body mass index (BMI) 39.0-39.9, adult: Secondary | ICD-10-CM

## 2021-09-20 DIAGNOSIS — E559 Vitamin D deficiency, unspecified: Secondary | ICD-10-CM

## 2021-09-20 DIAGNOSIS — E669 Obesity, unspecified: Secondary | ICD-10-CM | POA: Diagnosis not present

## 2021-09-20 MED ORDER — VITAMIN D (ERGOCALCIFEROL) 1.25 MG (50000 UNIT) PO CAPS: ORAL_CAPSULE | ORAL | 0 refills | Status: DC

## 2021-09-20 MED ORDER — METFORMIN HCL 500 MG PO TABS: ORAL_TABLET | ORAL | 0 refills | Status: DC

## 2021-09-21 NOTE — Progress Notes (Signed)
Chief Complaint:   OBESITY Alexis Christensen is here to discuss her progress with her obesity treatment plan along with follow-up of her obesity related diagnoses. Alexis Christensen is on the Category 1 Plan and states she is following her eating plan approximately 85% of the time. Alexis Christensen states she is walking for 30 minutes 4 times per week.  Today's visit was #: 10 Starting weight: 229 lbs Starting date: 03/28/2021 Today's weight: 214 lbs Today's date: 09/20/2020 Total lbs lost to date: 15 lbs Total lbs lost since last in-office visit: 0  Interim History: Alexis Christensen says she has been consuming more carbs and has been doing more off plan eating lately.  She gained in fat mass and lost 5 pounds in muscle mass.  She is eating only 2 oz of protein at lunch and 4 oz of protein at dinner.  She was advised about "healthy options" at [facility] and other fast food restaurants that will help her stay on plan.  Subjective:   1. Pre-diabetes Alexis Christensen's metformin was increased to BID at last office visit.  Tolerating well.  She takes 1 tablet in the morning and 1 tablet in the late afternoon.  She denies GI side effects and says it has decreased cravings (less snacking).  2. B12 deficiency She started OTC vitamin B12 500 mcg daily.  Tolerating well.  3. Vitamin D deficiency Alexis Christensen says she would like to stop the OTC vitamin D 5000 IU and take her prescription vitamin D twice weekly.  She says that it is "too expensive" over the counter.  Assessment/Plan:  No orders of the defined types were placed in this encounter.   Medications Discontinued During This Encounter  Medication Reason   ABILIFY MAINTENA 400 MG PRSY prefilled syringe    Cholecalciferol (VITAMIN D3) 125 MCG (5000 UT) TABS    metFORMIN (GLUCOPHAGE) 500 MG tablet Reorder   Vitamin D, Ergocalciferol, (DRISDOL) 1.25 MG (50000 UNIT) CAPS capsule Reorder     Meds ordered this encounter  Medications   metFORMIN (GLUCOPHAGE) 500 MG tablet    Sig: 1 po with lunch and 1  po with dinner QD    Dispense:  60 tablet    Refill:  0    30 d supply;  ** OV for RF **   Do not send RF request   Vitamin D, Ergocalciferol, (DRISDOL) 1.25 MG (50000 UNIT) CAPS capsule    Sig: 1 po q wed and 1 po q sun    Dispense:  8 capsule    Refill:  0    30 d supply;  ** OV for RF **   Do not send RF request     1. Pre-diabetes Ryanna will continue to work on weight loss, exercise, and decreasing simple carbohydrates to help decrease the risk of diabetes. We will refill metformin 500 mg twice daily.  - Refill metFORMIN (GLUCOPHAGE) 500 MG tablet; 1 po with lunch and 1 po with dinner QD  Dispense: 60 tablet; Refill: 0  2. B12 deficiency The diagnosis was reviewed with the patient. Counseling provided today, see below. We will continue to monitor. Orders and follow up as documented in patient record.  Continue vitamin B12 supplement.  Counseling The body needs vitamin B12: to make red blood cells; to make DNA; and to help the nerves work properly so they can carry messages from the brain to the body.  The main causes of vitamin B12 deficiency include dietary deficiency, digestive diseases, pernicious anemia, and having a surgery in  which part of the stomach or small intestine is removed.  Certain medicines can make it harder for the body to absorb vitamin B12. These medicines include: heartburn medications; some antibiotics; some medications used to treat diabetes, gout, and high cholesterol.  In some cases, there are no symptoms of this condition. If the condition leads to anemia or nerve damage, various symptoms can occur, such as weakness or fatigue, shortness of breath, and numbness or tingling in your hands and feet.   Treatment:  May include taking vitamin B12 supplements.  Avoid alcohol.  Eat lots of healthy foods that contain vitamin B12: Beef, pork, chicken, [place], and organ meats, such as liver.  Seafood: This includes clams, rainbow trout, salmon, tuna, and haddock. Eggs.   Cereal and dairy products that are fortified: This means that vitamin B12 has been added to the food.   3. Vitamin D deficiency Increase ergocalciferol to twice weekly and refill for 30 days.  Discontinue OTC vitamin D supplement.  - Refill Vitamin D, Ergocalciferol, (DRISDOL) 1.25 MG (50000 UNIT) CAPS capsule; 1 po q wed and 1 po q sun  Dispense: 8 capsule; Refill: 0  4. Obesity with current BMI of 36.9  Alexis Christensen is currently in the action stage of change. As such, her goal is to continue with weight loss efforts. She has agreed to the Category 1 Plan.   Exercise goals:  As is.  Great job!  Increase as tolerated.  Behavioral modification strategies: increasing lean protein intake, decreasing simple carbohydrates, decreasing eating out, and planning for success.  Alexis Christensen has agreed to follow-up with our clinic in 2-4 weeks. She was informed of the importance of frequent follow-up visits to maximize her success with intensive lifestyle modifications for her multiple health conditions.   Objective:   Blood pressure 102/67, pulse (!) 57, temperature (!) 97.4 F (36.3 C), temperature source Oral, height '5\' 4"'$  (1.626 m), weight 214 lb (97.1 kg), SpO2 99 %. Body mass index is 36.73 kg/m.  General: Cooperative, alert, well developed, in no acute distress. HEENT: Conjunctivae and lids unremarkable. Cardiovascular: Regular rhythm.  Lungs: Normal work of breathing. Neurologic: No focal deficits.   Lab Results  Component Value Date   CREATININE 0.92 07/17/2021   BUN 15 07/17/2021   NA 141 07/17/2021   K 3.8 07/17/2021   CL 104 07/17/2021   CO2 28 07/17/2021   Lab Results  Component Value Date   ALT 15 07/17/2021   AST 17 07/17/2021   ALKPHOS 77 07/17/2021   BILITOT 1.2 07/17/2021   Lab Results  Component Value Date   HGBA1C 5.7 06/20/2021   HGBA1C 6.0 03/06/2021   Lab Results  Component Value Date   INSULIN 6.4 08/01/2021   INSULIN 7.4 03/28/2021   Lab Results  Component  Value Date   TSH 8.66 (A) 06/20/2021   Lab Results  Component Value Date   CHOL 216 (A) 06/20/2021   HDL 61 06/20/2021   LDLCALC 141 06/20/2021   TRIG 80 06/20/2021   Lab Results  Component Value Date   VD25OH 25.7 (L) 08/01/2021   VD25OH 25.0 (L) 03/28/2021   Lab Results  Component Value Date   WBC 5.6 07/17/2021   HGB 13.5 07/17/2021   HCT 41.3 07/17/2021   MCV 90.8 07/17/2021   PLT 265 07/17/2021   Lab Results  Component Value Date   IRON 39 07/13/2016   TIBC 408 07/13/2016   FERRITIN 11 07/13/2016   Obesity Behavioral Intervention:   Approximately 15 minutes  were spent on the discussion below.  ASK: We discussed the diagnosis of obesity with Breyah today and Rozalia agreed to give [name] permission to discuss obesity behavioral modification therapy today.  ASSESS: Jordon has the diagnosis of obesity and her BMI today is 36.9. Ladonya is in the action stage of change.   ADVISE: Danicka was educated on the multiple health risks of obesity as well as the benefit of weight loss to improve her health. She was advised of the need for long term treatment and the importance of lifestyle modifications to improve her current health and to decrease her risk of future health problems.  AGREE: Multiple dietary modification options and treatment options were discussed and Dynastie agreed to follow the recommendations documented in the above note.  ARRANGE: Jaquesha was educated on the importance of frequent visits to treat obesity as outlined per CMS and USPSTF guidelines and agreed to schedule her next follow up appointment today.  Attestation Statements:   Reviewed by clinician on day of visit: allergies, medications, problem list, medical history, surgical history, family history, social history, and previous encounter notes.  I, Water quality scientist, CMA, am acting as Location manager for [employer], DO.  I have reviewed the above documentation for accuracy and completeness, and I agree with the  above. Marjory Sneddon, D.O.  The [place] was signed into law in 2016 which includes the topic of electronic health records.  This provides immediate access to information in MyChart.  This includes consultation notes, operative notes, office notes, lab results and pathology reports.  If you have any questions about what you read please let us know at your next visit so we can discuss your concerns and take corrective action if need be.  We are right here with you.

## 2021-09-22 DIAGNOSIS — J029 Acute pharyngitis, unspecified: Secondary | ICD-10-CM | POA: Diagnosis not present

## 2021-09-23 ENCOUNTER — Emergency Department: Payer: Medicare Other

## 2021-09-23 ENCOUNTER — Emergency Department
Admission: EM | Admit: 2021-09-23 | Discharge: 2021-09-23 | Disposition: A | Discharge status: Home / Self Care | Payer: Medicare Other | Attending: Emergency Medicine | Admitting: Emergency Medicine

## 2021-09-23 ENCOUNTER — Other Ambulatory Visit: Payer: Self-pay

## 2021-09-23 DIAGNOSIS — S199XXA Unspecified injury of neck, initial encounter: Secondary | ICD-10-CM | POA: Insufficient documentation

## 2021-09-23 DIAGNOSIS — R001 Bradycardia, unspecified: Secondary | ICD-10-CM | POA: Insufficient documentation

## 2021-09-23 DIAGNOSIS — S3992XA Unspecified injury of lower back, initial encounter: Secondary | ICD-10-CM | POA: Insufficient documentation

## 2021-09-23 DIAGNOSIS — M545 Low back pain, unspecified: Secondary | ICD-10-CM | POA: Diagnosis not present

## 2021-09-23 DIAGNOSIS — M542 Cervicalgia: Secondary | ICD-10-CM | POA: Diagnosis not present

## 2021-09-23 DIAGNOSIS — M546 Pain in thoracic spine: Secondary | ICD-10-CM | POA: Diagnosis not present

## 2021-09-23 DIAGNOSIS — Y92410 Unspecified street and highway as the place of occurrence of the external cause: Secondary | ICD-10-CM | POA: Insufficient documentation

## 2021-09-23 MED ORDER — MELOXICAM 15 MG PO TABS: 15.0000 mg | ORAL_TABLET | Freq: Every day | ORAL | 1 refills | Status: AC

## 2021-09-23 MED ORDER — METHOCARBAMOL 500 MG PO TABS
500.0000 mg | ORAL_TABLET | Freq: Three times a day (TID) | ORAL | 0 refills | Status: AC | PRN
Start: 2021-09-23 — End: 2021-09-28

## 2021-09-23 NOTE — Discharge Instructions (Addendum)
Take meloxicam and Robaxin as needed for pain and inflammation. ?
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
## 2021-10-02 DIAGNOSIS — R300 Dysuria: Secondary | ICD-10-CM | POA: Diagnosis not present

## 2021-10-05 DIAGNOSIS — I10 Essential (primary) hypertension: Secondary | ICD-10-CM | POA: Diagnosis not present

## 2021-10-05 DIAGNOSIS — E785 Hyperlipidemia, unspecified: Secondary | ICD-10-CM | POA: Diagnosis not present

## 2021-10-05 DIAGNOSIS — R7303 Prediabetes: Secondary | ICD-10-CM | POA: Diagnosis not present

## 2021-10-05 DIAGNOSIS — E039 Hypothyroidism, unspecified: Secondary | ICD-10-CM | POA: Diagnosis not present

## 2021-10-06 DIAGNOSIS — R7303 Prediabetes: Secondary | ICD-10-CM | POA: Diagnosis not present

## 2021-10-06 DIAGNOSIS — M545 Low back pain, unspecified: Secondary | ICD-10-CM | POA: Diagnosis not present

## 2021-10-06 DIAGNOSIS — I10 Essential (primary) hypertension: Secondary | ICD-10-CM | POA: Diagnosis not present

## 2021-10-06 DIAGNOSIS — E039 Hypothyroidism, unspecified: Secondary | ICD-10-CM | POA: Diagnosis not present

## 2021-10-06 DIAGNOSIS — E785 Hyperlipidemia, unspecified: Secondary | ICD-10-CM | POA: Diagnosis not present

## 2021-10-10 DIAGNOSIS — Z79899 Other long term (current) drug therapy: Secondary | ICD-10-CM | POA: Diagnosis not present

## 2021-10-11 ENCOUNTER — Ambulatory Visit (INDEPENDENT_AMBULATORY_CARE_PROVIDER_SITE_OTHER): Payer: Medicare Other | Admitting: Family Medicine

## 2021-10-17 DIAGNOSIS — Z79899 Other long term (current) drug therapy: Secondary | ICD-10-CM | POA: Diagnosis not present

## 2021-10-17 DIAGNOSIS — G8929 Other chronic pain: Secondary | ICD-10-CM | POA: Diagnosis not present

## 2021-10-31 ENCOUNTER — Ambulatory Visit (INDEPENDENT_AMBULATORY_CARE_PROVIDER_SITE_OTHER): Payer: Medicare Other | Admitting: Family Medicine

## 2021-10-31 ENCOUNTER — Encounter (INDEPENDENT_AMBULATORY_CARE_PROVIDER_SITE_OTHER): Payer: Self-pay | Admitting: Family Medicine

## 2021-10-31 VITALS — BP 94/61 | HR 75 | Temp 97.9°F | Ht 64.0 in | Wt 205.0 lb

## 2021-10-31 DIAGNOSIS — R7303 Prediabetes: Secondary | ICD-10-CM

## 2021-10-31 DIAGNOSIS — E559 Vitamin D deficiency, unspecified: Secondary | ICD-10-CM

## 2021-10-31 DIAGNOSIS — Z6839 Body mass index (BMI) 39.0-39.9, adult: Secondary | ICD-10-CM

## 2021-10-31 DIAGNOSIS — E669 Obesity, unspecified: Secondary | ICD-10-CM | POA: Diagnosis not present

## 2021-10-31 DIAGNOSIS — E538 Deficiency of other specified B group vitamins: Secondary | ICD-10-CM

## 2021-10-31 DIAGNOSIS — Z6835 Body mass index (BMI) 35.0-35.9, adult: Secondary | ICD-10-CM

## 2021-10-31 MED ORDER — CYANOCOBALAMIN 500 MCG PO TABS: 500.0000 ug | ORAL_TABLET | Freq: Every day | ORAL | 12 refills | Status: DC

## 2021-10-31 MED ORDER — METFORMIN HCL 500 MG PO TABS: ORAL_TABLET | ORAL | 0 refills | Status: DC

## 2021-10-31 MED ORDER — VITAMIN D (ERGOCALCIFEROL) 1.25 MG (50000 UNIT) PO CAPS: ORAL_CAPSULE | ORAL | 0 refills | Status: DC

## 2021-11-02 ENCOUNTER — Other Ambulatory Visit (INDEPENDENT_AMBULATORY_CARE_PROVIDER_SITE_OTHER): Payer: Self-pay | Admitting: Family Medicine

## 2021-11-02 DIAGNOSIS — R7303 Prediabetes: Secondary | ICD-10-CM

## 2021-11-07 DIAGNOSIS — G8929 Other chronic pain: Secondary | ICD-10-CM | POA: Diagnosis not present

## 2021-11-07 DIAGNOSIS — Z79899 Other long term (current) drug therapy: Secondary | ICD-10-CM | POA: Diagnosis not present

## 2021-11-12 DIAGNOSIS — E039 Hypothyroidism, unspecified: Secondary | ICD-10-CM | POA: Diagnosis not present

## 2021-11-12 DIAGNOSIS — I10 Essential (primary) hypertension: Secondary | ICD-10-CM | POA: Diagnosis not present

## 2021-11-12 DIAGNOSIS — E785 Hyperlipidemia, unspecified: Secondary | ICD-10-CM | POA: Diagnosis not present

## 2021-11-13 DIAGNOSIS — G8929 Other chronic pain: Secondary | ICD-10-CM | POA: Diagnosis not present

## 2021-11-14 DIAGNOSIS — Z79899 Other long term (current) drug therapy: Secondary | ICD-10-CM | POA: Diagnosis not present
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
## 2021-11-23 DIAGNOSIS — M79604 Pain in right leg: Secondary | ICD-10-CM | POA: Diagnosis not present

## 2021-11-23 DIAGNOSIS — T148XXA Other injury of unspecified body region, initial encounter: Secondary | ICD-10-CM | POA: Diagnosis not present

## 2021-11-23 DIAGNOSIS — R0600 Dyspnea, unspecified: Secondary | ICD-10-CM | POA: Diagnosis not present

## 2021-11-23 DIAGNOSIS — R609 Edema, unspecified: Secondary | ICD-10-CM | POA: Diagnosis not present

## 2021-11-23 DIAGNOSIS — R5383 Other fatigue: Secondary | ICD-10-CM | POA: Diagnosis not present

## 2021-11-23 DIAGNOSIS — M79605 Pain in left leg: Secondary | ICD-10-CM | POA: Diagnosis not present

## 2021-11-25 DIAGNOSIS — Z79899 Other long term (current) drug therapy: Secondary | ICD-10-CM | POA: Diagnosis not present

## 2021-11-25 DIAGNOSIS — M79605 Pain in left leg: Secondary | ICD-10-CM | POA: Diagnosis not present

## 2021-11-25 DIAGNOSIS — R600 Localized edema: Secondary | ICD-10-CM | POA: Diagnosis not present

## 2021-11-25 DIAGNOSIS — F17210 Nicotine dependence, cigarettes, uncomplicated: Secondary | ICD-10-CM | POA: Diagnosis not present

## 2021-11-25 DIAGNOSIS — Z880 Allergy status to penicillin: Secondary | ICD-10-CM | POA: Diagnosis not present

## 2021-11-25 DIAGNOSIS — E785 Hyperlipidemia, unspecified: Secondary | ICD-10-CM | POA: Diagnosis not present

## 2021-11-25 DIAGNOSIS — K449 Diaphragmatic hernia without obstruction or gangrene: Secondary | ICD-10-CM | POA: Diagnosis not present

## 2021-11-25 DIAGNOSIS — E039 Hypothyroidism, unspecified: Secondary | ICD-10-CM | POA: Diagnosis not present

## 2021-11-25 DIAGNOSIS — R0789 Other chest pain: Secondary | ICD-10-CM | POA: Diagnosis not present

## 2021-11-25 DIAGNOSIS — R791 Abnormal coagulation profile: Secondary | ICD-10-CM | POA: Diagnosis not present

## 2021-11-25 DIAGNOSIS — R079 Chest pain, unspecified: Secondary | ICD-10-CM | POA: Diagnosis not present

## 2021-11-28 ENCOUNTER — Ambulatory Visit (INDEPENDENT_AMBULATORY_CARE_PROVIDER_SITE_OTHER): Payer: Medicare Other | Admitting: Family Medicine

## 2021-11-28 DIAGNOSIS — Z79899 Other long term (current) drug therapy: Secondary | ICD-10-CM | POA: Diagnosis not present

## 2021-11-28 DIAGNOSIS — G8929 Other chronic pain: Secondary | ICD-10-CM | POA: Diagnosis not present

## 2021-12-07 DIAGNOSIS — E042 Nontoxic multinodular goiter: Secondary | ICD-10-CM | POA: Diagnosis not present

## 2021-12-07 DIAGNOSIS — E063 Autoimmune thyroiditis: Secondary | ICD-10-CM | POA: Diagnosis not present

## 2021-12-11 ENCOUNTER — Encounter (INDEPENDENT_AMBULATORY_CARE_PROVIDER_SITE_OTHER): Payer: Self-pay | Admitting: Family Medicine

## 2021-12-12 DIAGNOSIS — G8929 Other chronic pain: Secondary | ICD-10-CM | POA: Diagnosis not present

## 2021-12-12 DIAGNOSIS — Z79899 Other long term (current) drug therapy: Secondary | ICD-10-CM | POA: Diagnosis not present

## 2021-12-12 NOTE — Telephone Encounter (Signed)
Patient appt has been cancelled.

## 2021-12-13 ENCOUNTER — Ambulatory Visit (INDEPENDENT_AMBULATORY_CARE_PROVIDER_SITE_OTHER): Payer: Medicare Other | Admitting: Family Medicine

## 2021-12-14 ENCOUNTER — Encounter: Payer: Self-pay | Admitting: Oncology

## 2021-12-14 DIAGNOSIS — E063 Autoimmune thyroiditis: Secondary | ICD-10-CM | POA: Diagnosis not present

## 2021-12-14 DIAGNOSIS — E042 Nontoxic multinodular goiter: Secondary | ICD-10-CM | POA: Diagnosis not present

## 2021-12-26 DIAGNOSIS — Z79899 Other long term (current) drug therapy: Secondary | ICD-10-CM | POA: Diagnosis not present

## 2022-01-01 ENCOUNTER — Encounter (INDEPENDENT_AMBULATORY_CARE_PROVIDER_SITE_OTHER): Payer: Self-pay | Admitting: Family Medicine

## 2022-01-01 ENCOUNTER — Other Ambulatory Visit (INDEPENDENT_AMBULATORY_CARE_PROVIDER_SITE_OTHER): Payer: Self-pay | Admitting: Family Medicine

## 2022-01-01 ENCOUNTER — Ambulatory Visit (INDEPENDENT_AMBULATORY_CARE_PROVIDER_SITE_OTHER): Payer: Medicare Other | Admitting: Family Medicine

## 2022-01-01 VITALS — BP 108/68 | HR 54 | Temp 98.1°F | Ht 64.0 in | Wt 202.6 lb

## 2022-01-01 DIAGNOSIS — E669 Obesity, unspecified: Secondary | ICD-10-CM

## 2022-01-01 DIAGNOSIS — Z7984 Long term (current) use of oral hypoglycemic drugs: Secondary | ICD-10-CM

## 2022-01-01 DIAGNOSIS — E538 Deficiency of other specified B group vitamins: Secondary | ICD-10-CM

## 2022-01-01 DIAGNOSIS — R7303 Prediabetes: Secondary | ICD-10-CM

## 2022-01-01 DIAGNOSIS — E559 Vitamin D deficiency, unspecified: Secondary | ICD-10-CM

## 2022-01-01 DIAGNOSIS — E7849 Other hyperlipidemia: Secondary | ICD-10-CM | POA: Diagnosis not present

## 2022-01-01 DIAGNOSIS — Z6834 Body mass index (BMI) 34.0-34.9, adult: Secondary | ICD-10-CM

## 2022-01-01 MED ORDER — VITAMIN D (ERGOCALCIFEROL) 1.25 MG (50000 UNIT) PO CAPS: ORAL_CAPSULE | ORAL | 0 refills | Status: DC

## 2022-01-01 MED ORDER — METFORMIN HCL 500 MG PO TABS: ORAL_TABLET | ORAL | 0 refills | Status: DC

## 2022-01-03 DIAGNOSIS — E042 Nontoxic multinodular goiter: Secondary | ICD-10-CM | POA: Diagnosis not present

## 2022-01-04 DIAGNOSIS — E042 Nontoxic multinodular goiter: Secondary | ICD-10-CM | POA: Diagnosis not present

## 2022-01-09 DIAGNOSIS — G8929 Other chronic pain: Secondary | ICD-10-CM | POA: Diagnosis not present

## 2022-01-09 DIAGNOSIS — Z79899 Other long term (current) drug therapy: Secondary | ICD-10-CM | POA: Diagnosis not present

## 2022-01-21 ENCOUNTER — Encounter: Payer: Self-pay | Admitting: Oncology

## 2022-01-21 ENCOUNTER — Emergency Department
Admission: EM | Admit: 2022-01-21 | Discharge: 2022-01-21 | Disposition: A | Discharge status: Home / Self Care | Payer: Medicare Other | Attending: Student in an Organized Health Care Education/Training Program | Admitting: Student in an Organized Health Care Education/Training Program

## 2022-01-21 ENCOUNTER — Other Ambulatory Visit: Payer: Self-pay

## 2022-01-21 DIAGNOSIS — M778 Other enthesopathies, not elsewhere classified: Secondary | ICD-10-CM

## 2022-01-21 DIAGNOSIS — E039 Hypothyroidism, unspecified: Secondary | ICD-10-CM | POA: Insufficient documentation

## 2022-01-21 DIAGNOSIS — J0140 Acute pansinusitis, unspecified: Secondary | ICD-10-CM | POA: Insufficient documentation

## 2022-01-21 DIAGNOSIS — N189 Chronic kidney disease, unspecified: Secondary | ICD-10-CM | POA: Diagnosis not present

## 2022-01-21 DIAGNOSIS — J45909 Unspecified asthma, uncomplicated: Secondary | ICD-10-CM | POA: Diagnosis not present

## 2022-01-21 DIAGNOSIS — I5030 Unspecified diastolic (congestive) heart failure: Secondary | ICD-10-CM | POA: Insufficient documentation

## 2022-01-21 DIAGNOSIS — M65241 Calcific tendinitis, right hand: Secondary | ICD-10-CM | POA: Diagnosis not present

## 2022-01-21 DIAGNOSIS — R0981 Nasal congestion: Secondary | ICD-10-CM | POA: Diagnosis present

## 2022-01-21 MED ORDER — PREDNISONE 20 MG PO TABS
50.0000 mg | ORAL_TABLET | Freq: Once | ORAL | Status: AC
  Administered 2022-01-21: 50 mg via ORAL
  Filled 2022-01-21: qty 3

## 2022-01-21 MED ORDER — METHYLPREDNISOLONE 4 MG PO TBPK: ORAL_TABLET | ORAL | 0 refills | Status: DC

## 2022-01-21 MED ORDER — AMOXICILLIN 500 MG PO TABS: 500.0000 mg | ORAL_TABLET | Freq: Three times a day (TID) | ORAL | 0 refills | Status: DC

## 2022-01-21 MED ORDER — AMOXICILLIN 500 MG PO CAPS
500.0000 mg | ORAL_CAPSULE | Freq: Once | ORAL | Status: AC
  Administered 2022-01-21: 500 mg via ORAL
  Filled 2022-01-21: qty 1

## 2022-01-21 NOTE — ED Triage Notes (Signed)
Pt comes with c/o sinus infection for two days. Pt states runny nose and cough.

## 2022-01-21 NOTE — ED Provider Notes (Signed)
[facility] Provider Note    Event Date/Time   First MD Initiated Contact with Patient 01/21/22 1641     (approximate)   History   Nasal Congestion   HPI  Alexis Christensen is a 51 y.o. female with history as listed below presents to the emergency department for treatment and evaluation of sinus pressure, headache, rhinorrhea for the last week that has progressively worsened over the last 2 days. No relief with allergy medication. No known fever.  Past Medical History:  Diagnosis Date   Acute diastolic (congestive) heart failure (HCC)    ADHD    Adopted    Anemia    Anxiety    Arthritis    Asthma    B12 deficiency    Bilateral swelling of feet and ankles    Bipolar 1 disorder (HCC)    Bipolar disorder (HCC)    Carpal tunnel syndrome 09/15/2014   Chest pain    CHF (congestive heart failure) ([place])    Dialostic CHF   Chronic diarrhea 09/07/2015   Chronic kidney disease    H/O KIDNEY STONES   Chronic venous insufficiency 04/26/2016   Depression    DVT (deep venous thrombosis) ([place]) 2016   RIGHT LEG   Edema leg    Effusion of knee 12/30/2013   Family history of adverse reaction to anesthesia    ADOPTED   Food allergy    GERD (gastroesophageal reflux disease)    Goiter    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   Headache(784.0)    MIGRAINES   Heart burn    Heart murmur    History of blood clots    History of kidney stones    HLD (hyperlipidemia)    Hx MRSA infection    Hyperlipidemia    Hypothyroidism    Lipoma of arm 05/11/2013   Lower extremity edema    Lymphedema 04/26/2016   Other fatigue    Post traumatic stress disorder (PTSD)    Prediabetes    PVD (peripheral vascular disease) (HCC)    Seasonal allergies    Shortness of breath    Shortness of breath on exertion    Stress incontinence    Thyroid disease    Urge incontinence    Vitamin D deficiency      Physical Exam   Triage Vital Signs: ED Triage Vitals  Enc Vitals  Group     BP 01/21/22 1609 126/81     Pulse Rate 01/21/22 1609 80     Resp 01/21/22 1609 16     Temp 01/21/22 1609 98.4 F (36.9 C)     Temp Source 01/21/22 1609 Oral     SpO2 01/21/22 1609 98 %     Weight 01/21/22 1610 200 lb (90.7 kg)     Height 01/21/22 1610 '5\' 4"'$  (1.626 m)     Head Circumference --      Peak Flow --      Pain Score 01/21/22 1608 0     Pain Loc --      Pain Edu? --      Excl. in [place]? --     Most recent vital signs: Vitals:   01/21/22 1609 01/21/22 1750  BP: 126/81 111/64  Pulse: 80 62  Resp: 16 18  Temp: 98.4 F (36.9 C) 98 F (36.7 C)  SpO2: 98% 97%    General: Awake, no distress.  CV:  Good peripheral perfusion.  Resp:  Normal effort. Breath sounds clear to auscultation.  Abd:  No distention.  Other:  Maxillary, ethmoid, and frontal sinus tenderness on palpation.   ED Results / Procedures / Treatments   Labs (all labs ordered are listed, but only abnormal results are displayed) Labs Reviewed - No data to display   EKG  Not indicated   RADIOLOGY  Not indicated  I have independently reviewed and interpreted imaging as well as reviewed report from radiology.  PROCEDURES:  Critical Care performed: No  Procedures   MEDICATIONS ORDERED IN ED:  Medications  predniSONE (DELTASONE) tablet 50 mg (50 mg Oral Given 01/21/22 1731)  amoxicillin (AMOXIL) capsule 500 mg (500 mg Oral Given 01/21/22 1733)     IMPRESSION / MDM / ASSESSMENT AND PLAN / ED COURSE   I reviewed the triage vital signs and the nursing notes.  Differential diagnosis includes, but is not limited to: Sinus infection, viral URI, COVID, influenza  Patient's presentation is most consistent with acute, uncomplicated illness.  51 year old female presenting to the emergency department for treatment and evaluation of sinusitis as described in the HPI.  We discussed treatment options.  Patient would like to be treated with antibiotic and prednisone.  Patient also mentions  that she has pain in her right hand in the area where she had previous surgery over 20 years ago.  She had attempted to pick up her granddaughter yesterday and had sudden onset of pain.  She did not fall or have any specific injury. No imaging necessary at this time. She is able to form a fist without pain. Motor and sensory function is intact. She will follow up with PCP if not improving over the week.     FINAL CLINICAL IMPRESSION(S) / ED DIAGNOSES   Final diagnoses:  Acute pansinusitis, recurrence not specified  Tendonitis of right hand     Rx / DC Orders   ED Discharge Orders          Ordered    amoxicillin (AMOXIL) 500 MG tablet  3 times daily        01/21/22 1722    methylPREDNISolone (MEDROL DOSEPAK) 4 MG TBPK tablet        01/21/22 1722             Note:  This document was prepared using Dragon voice recognition software and may include unintentional dictation errors.   Victorino Dike, FNP 01/21/22 Alfonzo Beers, MD 01/21/22 [phone]

## 2022-01-21 NOTE — Discharge Instructions (Signed)
Follow-up with primary care if not improving over the week.  Return to the emergency department for symptoms that change or worsen if you are unable to schedule an appointment.

## 2022-01-27 DIAGNOSIS — Z7289 Other problems related to lifestyle: Secondary | ICD-10-CM | POA: Diagnosis not present

## 2022-01-27 DIAGNOSIS — Z79899 Other long term (current) drug therapy: Secondary | ICD-10-CM | POA: Diagnosis not present

## 2022-01-27 DIAGNOSIS — J45909 Unspecified asthma, uncomplicated: Secondary | ICD-10-CM | POA: Diagnosis not present

## 2022-01-27 DIAGNOSIS — R0982 Postnasal drip: Secondary | ICD-10-CM | POA: Diagnosis not present

## 2022-01-27 DIAGNOSIS — Z880 Allergy status to penicillin: Secondary | ICD-10-CM | POA: Diagnosis not present

## 2022-01-27 DIAGNOSIS — J329 Chronic sinusitis, unspecified: Secondary | ICD-10-CM | POA: Diagnosis not present

## 2022-01-27 DIAGNOSIS — M1990 Unspecified osteoarthritis, unspecified site: Secondary | ICD-10-CM | POA: Diagnosis not present

## 2022-01-27 DIAGNOSIS — R519 Headache, unspecified: Secondary | ICD-10-CM | POA: Diagnosis not present

## 2022-01-27 DIAGNOSIS — F17210 Nicotine dependence, cigarettes, uncomplicated: Secondary | ICD-10-CM | POA: Diagnosis not present

## 2022-01-27 DIAGNOSIS — R0789 Other chest pain: Secondary | ICD-10-CM | POA: Diagnosis not present

## 2022-01-27 DIAGNOSIS — Z885 Allergy status to narcotic agent status: Secondary | ICD-10-CM | POA: Diagnosis not present

## 2022-01-27 DIAGNOSIS — I5032 Chronic diastolic (congestive) heart failure: Secondary | ICD-10-CM | POA: Diagnosis not present

## 2022-01-27 DIAGNOSIS — R0600 Dyspnea, unspecified: Secondary | ICD-10-CM | POA: Diagnosis not present

## 2022-01-27 DIAGNOSIS — R079 Chest pain, unspecified: Secondary | ICD-10-CM | POA: Diagnosis not present

## 2022-01-27 DIAGNOSIS — E785 Hyperlipidemia, unspecified: Secondary | ICD-10-CM | POA: Diagnosis not present

## 2022-01-27 DIAGNOSIS — E079 Disorder of thyroid, unspecified: Secondary | ICD-10-CM | POA: Diagnosis not present

## 2022-01-27 DIAGNOSIS — R059 Cough, unspecified: Secondary | ICD-10-CM | POA: Diagnosis not present

## 2022-01-27 DIAGNOSIS — E039 Hypothyroidism, unspecified: Secondary | ICD-10-CM | POA: Diagnosis not present

## 2022-01-28 DIAGNOSIS — R079 Chest pain, unspecified: Secondary | ICD-10-CM | POA: Diagnosis not present

## 2022-01-29 DIAGNOSIS — J069 Acute upper respiratory infection, unspecified: Secondary | ICD-10-CM | POA: Diagnosis not present

## 2022-01-29 DIAGNOSIS — R5383 Other fatigue: Secondary | ICD-10-CM | POA: Diagnosis not present

## 2022-01-29 DIAGNOSIS — R0600 Dyspnea, unspecified: Secondary | ICD-10-CM | POA: Diagnosis not present

## 2022-01-29 DIAGNOSIS — E039 Hypothyroidism, unspecified: Secondary | ICD-10-CM | POA: Diagnosis not present

## 2022-01-29 DIAGNOSIS — J302 Other seasonal allergic rhinitis: Secondary | ICD-10-CM | POA: Diagnosis not present

## 2022-01-30 DIAGNOSIS — R059 Cough, unspecified: Secondary | ICD-10-CM | POA: Diagnosis not present

## 2022-01-30 DIAGNOSIS — F17200 Nicotine dependence, unspecified, uncomplicated: Secondary | ICD-10-CM | POA: Diagnosis not present

## 2022-01-30 DIAGNOSIS — Z79899 Other long term (current) drug therapy: Secondary | ICD-10-CM | POA: Diagnosis not present

## 2022-02-01 ENCOUNTER — Other Ambulatory Visit (INDEPENDENT_AMBULATORY_CARE_PROVIDER_SITE_OTHER): Payer: Self-pay | Admitting: Family Medicine

## 2022-02-01 DIAGNOSIS — R0600 Dyspnea, unspecified: Secondary | ICD-10-CM | POA: Diagnosis not present

## 2022-02-01 DIAGNOSIS — E785 Hyperlipidemia, unspecified: Secondary | ICD-10-CM | POA: Diagnosis not present

## 2022-02-01 DIAGNOSIS — I10 Essential (primary) hypertension: Secondary | ICD-10-CM | POA: Diagnosis not present

## 2022-02-01 DIAGNOSIS — E039 Hypothyroidism, unspecified: Secondary | ICD-10-CM | POA: Diagnosis not present

## 2022-02-01 DIAGNOSIS — R7303 Prediabetes: Secondary | ICD-10-CM

## 2022-02-01 DIAGNOSIS — J309 Allergic rhinitis, unspecified: Secondary | ICD-10-CM | POA: Diagnosis not present

## 2022-02-01 LAB — LIPID PANEL
Cholesterol: 199 (ref 0–200)
HDL: 63 (ref 35–70)
LDL Cholesterol: 121
Triglycerides: 81 (ref 40–160)

## 2022-02-01 LAB — BASIC METABOLIC PANEL
BUN: 19 (ref 4–21)
CO2: 24 — AB (ref 13–22)
Chloride: 104 (ref 99–108)
Creatinine: 0.7 (ref 0.5–1.1)
Glucose: 115
Potassium: 3.8 mEq/L (ref 3.5–5.1)
Sodium: 144 (ref 137–147)

## 2022-02-01 LAB — TSH: TSH: 15.9 — AB (ref 0.41–5.90)

## 2022-02-01 LAB — HEPATIC FUNCTION PANEL
ALT: 12 U/L (ref 7–35)
AST: 12 — AB (ref 13–35)
Bilirubin, Total: 0.4

## 2022-02-01 LAB — HEMOGLOBIN A1C: Hemoglobin A1C: 5.9

## 2022-02-01 LAB — COMPREHENSIVE METABOLIC PANEL
Albumin: 4 (ref 3.5–5.0)
Calcium: 9.2 (ref 8.7–10.7)
Globulin: 2.2
eGFR: 97

## 2022-02-05 DIAGNOSIS — E039 Hypothyroidism, unspecified: Secondary | ICD-10-CM | POA: Diagnosis not present

## 2022-02-05 DIAGNOSIS — R0600 Dyspnea, unspecified: Secondary | ICD-10-CM | POA: Diagnosis not present

## 2022-02-05 DIAGNOSIS — R7303 Prediabetes: Secondary | ICD-10-CM | POA: Diagnosis not present

## 2022-02-05 DIAGNOSIS — E785 Hyperlipidemia, unspecified: Secondary | ICD-10-CM | POA: Diagnosis not present

## 2022-02-05 DIAGNOSIS — J309 Allergic rhinitis, unspecified: Secondary | ICD-10-CM | POA: Diagnosis not present

## 2022-02-06 ENCOUNTER — Ambulatory Visit (INDEPENDENT_AMBULATORY_CARE_PROVIDER_SITE_OTHER): Payer: Medicare Other | Admitting: Family Medicine

## 2022-02-06 ENCOUNTER — Other Ambulatory Visit (INDEPENDENT_AMBULATORY_CARE_PROVIDER_SITE_OTHER): Payer: Self-pay | Admitting: Family Medicine

## 2022-02-06 DIAGNOSIS — R7303 Prediabetes: Secondary | ICD-10-CM

## 2022-02-06 DIAGNOSIS — E559 Vitamin D deficiency, unspecified: Secondary | ICD-10-CM

## 2022-02-12 DIAGNOSIS — F17210 Nicotine dependence, cigarettes, uncomplicated: Secondary | ICD-10-CM | POA: Diagnosis not present

## 2022-02-12 DIAGNOSIS — R0602 Shortness of breath: Secondary | ICD-10-CM | POA: Diagnosis not present

## 2022-02-12 DIAGNOSIS — I10 Essential (primary) hypertension: Secondary | ICD-10-CM | POA: Diagnosis not present

## 2022-02-12 DIAGNOSIS — I509 Heart failure, unspecified: Secondary | ICD-10-CM | POA: Diagnosis not present

## 2022-02-12 DIAGNOSIS — R609 Edema, unspecified: Secondary | ICD-10-CM | POA: Diagnosis not present

## 2022-02-13 DIAGNOSIS — G8929 Other chronic pain: Secondary | ICD-10-CM | POA: Diagnosis not present

## 2022-02-13 DIAGNOSIS — Z79899 Other long term (current) drug therapy: Secondary | ICD-10-CM | POA: Diagnosis not present

## 2022-02-14 ENCOUNTER — Ambulatory Visit (INDEPENDENT_AMBULATORY_CARE_PROVIDER_SITE_OTHER): Payer: Medicare Other | Admitting: Family Medicine

## 2022-02-14 ENCOUNTER — Other Ambulatory Visit (INDEPENDENT_AMBULATORY_CARE_PROVIDER_SITE_OTHER): Payer: Self-pay | Admitting: Family Medicine

## 2022-02-14 ENCOUNTER — Encounter (INDEPENDENT_AMBULATORY_CARE_PROVIDER_SITE_OTHER): Payer: Self-pay | Admitting: Family Medicine

## 2022-02-14 VITALS — BP 100/64 | HR 67 | Temp 98.2°F | Ht 64.0 in | Wt 211.0 lb

## 2022-02-14 DIAGNOSIS — E559 Vitamin D deficiency, unspecified: Secondary | ICD-10-CM

## 2022-02-14 DIAGNOSIS — Z6836 Body mass index (BMI) 36.0-36.9, adult: Secondary | ICD-10-CM

## 2022-02-14 DIAGNOSIS — R7303 Prediabetes: Secondary | ICD-10-CM | POA: Diagnosis not present

## 2022-02-14 DIAGNOSIS — Z7984 Long term (current) use of oral hypoglycemic drugs: Secondary | ICD-10-CM | POA: Diagnosis not present

## 2022-02-14 DIAGNOSIS — E669 Obesity, unspecified: Secondary | ICD-10-CM

## 2022-02-14 MED ORDER — VITAMIN D (ERGOCALCIFEROL) 1.25 MG (50000 UNIT) PO CAPS: ORAL_CAPSULE | ORAL | 0 refills | Status: DC

## 2022-02-14 MED ORDER — METFORMIN HCL 500 MG PO TABS: 500.0000 mg | ORAL_TABLET | Freq: Three times a day (TID) | ORAL | 0 refills | Status: DC

## 2022-02-15 LAB — CMP14+EGFR
ALT: 22 IU/L (ref 0–32)
AST: 17 IU/L (ref 0–40)
Albumin/Globulin Ratio: 1.6 (ref 1.2–2.2)
Albumin: 3.9 g/dL (ref 3.8–4.9)
Alkaline Phosphatase: 80 IU/L (ref 44–121)
BUN/Creatinine Ratio: 24 — ABNORMAL HIGH (ref 9–23)
BUN: 17 mg/dL (ref 6–24)
Bilirubin Total: 0.5 mg/dL (ref 0.0–1.2)
CO2: 24 mmol/L (ref 20–29)
Calcium: 8.7 mg/dL (ref 8.7–10.2)
Chloride: 100 mmol/L (ref 96–106)
Creatinine, Ser: 0.72 mg/dL (ref 0.57–1.00)
Globulin, Total: 2.4 g/dL (ref 1.5–4.5)
Glucose: 97 mg/dL (ref 70–99)
Potassium: 3.8 mmol/L (ref 3.5–5.2)
Sodium: 139 mmol/L (ref 134–144)
Total Protein: 6.3 g/dL (ref 6.0–8.5)
eGFR: 100 mL/min/{1.73_m2} (ref >59)

## 2022-02-15 LAB — INSULIN, RANDOM: INSULIN: 13.8 u[IU]/mL (ref 2.6–24.9)

## 2022-02-15 LAB — VITAMIN D 25 HYDROXY (VIT D DEFICIENCY, FRACTURES): Vit D, 25-Hydroxy: 19.6 ng/mL — ABNORMAL LOW (ref 30.0–100.0)

## 2022-02-19 ENCOUNTER — Encounter (INDEPENDENT_AMBULATORY_CARE_PROVIDER_SITE_OTHER): Payer: Self-pay | Admitting: Family Medicine

## 2022-02-19 DIAGNOSIS — R609 Edema, unspecified: Secondary | ICD-10-CM | POA: Diagnosis not present

## 2022-02-19 DIAGNOSIS — I509 Heart failure, unspecified: Secondary | ICD-10-CM | POA: Diagnosis not present

## 2022-02-19 DIAGNOSIS — I10 Essential (primary) hypertension: Secondary | ICD-10-CM | POA: Diagnosis not present

## 2022-02-19 DIAGNOSIS — E785 Hyperlipidemia, unspecified: Secondary | ICD-10-CM | POA: Diagnosis not present

## 2022-02-19 NOTE — Telephone Encounter (Signed)
Please review

## 2022-02-20 ENCOUNTER — Other Ambulatory Visit: Payer: Self-pay | Admitting: Cardiovascular Disease

## 2022-02-20 ENCOUNTER — Other Ambulatory Visit (HOSPITAL_COMMUNITY): Payer: Self-pay | Admitting: Cardiovascular Disease

## 2022-02-20 DIAGNOSIS — Z79899 Other long term (current) drug therapy: Secondary | ICD-10-CM | POA: Diagnosis not present

## 2022-02-20 DIAGNOSIS — M7989 Other specified soft tissue disorders: Secondary | ICD-10-CM

## 2022-02-20 DIAGNOSIS — G8929 Other chronic pain: Secondary | ICD-10-CM | POA: Diagnosis not present

## 2022-02-21 ENCOUNTER — Encounter (INDEPENDENT_AMBULATORY_CARE_PROVIDER_SITE_OTHER): Payer: Self-pay

## 2022-02-22 ENCOUNTER — Ambulatory Visit
Admission: RE | Admit: 2022-02-22 | Discharge: 2022-02-22 | Disposition: A | Discharge status: Home / Self Care | Payer: Medicare Other | Source: Ambulatory Visit | Attending: Cardiovascular Disease | Admitting: Cardiovascular Disease

## 2022-02-22 DIAGNOSIS — M7989 Other specified soft tissue disorders: Secondary | ICD-10-CM | POA: Insufficient documentation

## 2022-02-22 DIAGNOSIS — R0602 Shortness of breath: Secondary | ICD-10-CM | POA: Diagnosis not present

## 2022-02-25 ENCOUNTER — Other Ambulatory Visit: Payer: Self-pay

## 2022-02-25 ENCOUNTER — Emergency Department
Admission: EM | Admit: 2022-02-25 | Discharge: 2022-02-25 | Disposition: A | Discharge status: Home / Self Care | Payer: Medicare Other | Attending: Student in an Organized Health Care Education/Training Program | Admitting: Student in an Organized Health Care Education/Training Program

## 2022-02-25 DIAGNOSIS — R600 Localized edema: Secondary | ICD-10-CM | POA: Diagnosis not present

## 2022-02-25 DIAGNOSIS — J45909 Unspecified asthma, uncomplicated: Secondary | ICD-10-CM | POA: Diagnosis not present

## 2022-02-25 DIAGNOSIS — Z79899 Other long term (current) drug therapy: Secondary | ICD-10-CM | POA: Diagnosis not present

## 2022-02-25 DIAGNOSIS — E039 Hypothyroidism, unspecified: Secondary | ICD-10-CM | POA: Diagnosis not present

## 2022-02-25 DIAGNOSIS — Z7982 Long term (current) use of aspirin: Secondary | ICD-10-CM | POA: Insufficient documentation

## 2022-02-25 DIAGNOSIS — M79661 Pain in right lower leg: Secondary | ICD-10-CM | POA: Diagnosis not present

## 2022-02-25 DIAGNOSIS — M7989 Other specified soft tissue disorders: Secondary | ICD-10-CM | POA: Diagnosis present

## 2022-02-25 DIAGNOSIS — I5031 Acute diastolic (congestive) heart failure: Secondary | ICD-10-CM | POA: Insufficient documentation

## 2022-02-25 DIAGNOSIS — N189 Chronic kidney disease, unspecified: Secondary | ICD-10-CM | POA: Insufficient documentation

## 2022-02-25 DIAGNOSIS — Z96651 Presence of right artificial knee joint: Secondary | ICD-10-CM | POA: Diagnosis not present

## 2022-02-25 DIAGNOSIS — M79662 Pain in left lower leg: Secondary | ICD-10-CM | POA: Diagnosis not present

## 2022-02-25 DIAGNOSIS — Z87891 Personal history of nicotine dependence: Secondary | ICD-10-CM | POA: Diagnosis not present

## 2022-02-25 LAB — URINALYSIS, ROUTINE W REFLEX MICROSCOPIC
Bacteria, UA: NONE SEEN
Bilirubin Urine: NEGATIVE
Glucose, UA: NEGATIVE mg/dL
Hgb urine dipstick: NEGATIVE
Ketones, ur: NEGATIVE mg/dL
Nitrite: NEGATIVE
Protein, ur: 30 mg/dL — AB
Specific Gravity, Urine: 1.029 (ref 1.005–1.030)
pH: 5 (ref 5.0–8.0)

## 2022-02-25 LAB — BASIC METABOLIC PANEL
Anion gap: 10 (ref 5–15)
BUN: 15 mg/dL (ref 6–20)
CO2: 29 mmol/L (ref 22–32)
Calcium: 8.9 mg/dL (ref 8.9–10.3)
Chloride: 101 mmol/L (ref 98–111)
Creatinine, Ser: 0.69 mg/dL (ref 0.44–1.00)
GFR, Estimated: >60 mL/min (ref >60)
Glucose, Bld: 91 mg/dL (ref 70–99)
Potassium: 3.4 mmol/L — ABNORMAL LOW (ref 3.5–5.1)
Sodium: 140 mmol/L (ref 135–145)

## 2022-02-25 LAB — CBC
HCT: 38.8 % (ref 36.0–46.0)
Hemoglobin: 12.4 g/dL (ref 12.0–15.0)
MCH: 28.5 pg (ref 26.0–34.0)
MCHC: 32 g/dL (ref 30.0–36.0)
MCV: 89.2 fL (ref 80.0–100.0)
Platelets: 339 10*3/uL (ref 150–400)
RBC: 4.35 MIL/uL (ref 3.87–5.11)
RDW: 12.7 % (ref 11.5–15.5)
WBC: 6.2 10*3/uL (ref 4.0–10.5)
nRBC: 0 % (ref 0.0–0.2)

## 2022-02-25 LAB — BRAIN NATRIURETIC PEPTIDE: B Natriuretic Peptide: 19.3 pg/mL (ref 0.0–100.0)

## 2022-02-25 NOTE — ED Triage Notes (Signed)
Pt via POV from home. Pt c/o bilateral leg swelling and pain for the past 2 weeks, pt has a hx of diastolic CHF. States that she is not more SOB than normal but the pain is bothering her. States that they increased her '20mg'$  Lasix to '40mg'$  on Monday but the swelling has gotten worse. Pt got an [name] of her R leg last Thursday that was negative for blood clot. Pt is A&Ox4 and NAD

## 2022-02-25 NOTE — Discharge Instructions (Signed)
You have been seen today in the emergency room for swelling to bilateral lower extremities. Your exam and lab work were both reassuring today. I encourage you to continue taking your Lasix 40 mg daily. If you start to experience any shortness of breath, dyspnea on exertion, dizziness, change to the swelling/redness to your extremities please report back to the emergency room or urgent care promptly. Otherwise, follow-up with your primary care doctor or cardiology this coming week.

## 2022-02-25 NOTE — Progress Notes (Unsigned)
Chief Complaint:   OBESITY Wafaa is here to discuss her progress with her obesity treatment plan along with follow-up of her obesity related diagnoses. Geneviene is on the Category 1 Plan and states she is following her eating plan approximately 85% of the time. Nalayah states she is walking for 30 minutes 4 times per week.  Today's visit was #: 13 Starting weight: 229 lbs Starting date: 03/28/2021 Today's weight: 211 lbs Today's date: 02/14/2022 Total lbs lost to date: 18 Total lbs lost since last in-office visit: 0  Interim History: Dorisann lost her eating plan, but she has been trying to be mindful of her food choices.  She notes swelling in her legs and she is getting this worked-up.  It appears some of her weight gain is indeed due to fluid retention.  Subjective:   1. Vitamin D deficiency Mallissa is on vitamin D, and she is due for labs.  2. Pre-diabetes Edith and A1c done at her PCP recently and it was elevated even on metformin twice daily.  She notes polyphagia.  I discussed labs with the patient today.  Assessment/Plan:   1. Vitamin D deficiency We will check labs today, and we will refill prescription Vitamin D 50,000 IU twice weekly for 1 month. Anuradha will follow-up for routine testing of Vitamin D, at least 2-3 times per year to avoid over-replacement.  - VITAMIN D 25 Hydroxy (Vit-D Deficiency, Fractures) - Vitamin D, Ergocalciferol, (DRISDOL) 1.25 MG (50000 UNIT) CAPS capsule; 1 po q wed and 1 po q sun  Dispense: 8 capsule; Refill: 0  2. Pre-diabetes We will check labs today. Tabita agreed to increase metformin to 500 mg 3 times daily, with no refills.  - CMP14+EGFR - Insulin, random - metFORMIN (GLUCOPHAGE) 500 MG tablet; Take 1 tablet (500 mg total) by mouth 3 (three) times daily.  Dispense: 90 tablet; Refill: 0  3. Obesity, Current BMI 36.4 Sherlynn is currently in the action stage of change. As such, her goal is to continue with weight loss efforts. She has agreed to the  Category 1 Plan.   Exercise goals: As is.  Behavioral modification strategies: decreasing sodium intake.  Sherlene has agreed to follow-up with our clinic in 4 weeks. She was informed of the importance of frequent follow-up visits to maximize her success with intensive lifestyle modifications for her multiple health conditions.   Shevelle was informed we would discuss her lab results at her next visit unless there is a critical issue that needs to be addressed sooner. Tijah agreed to keep her next visit at the agreed upon time to discuss these results.  Objective:   Blood pressure 100/64, pulse 67, temperature 98.2 F (36.8 C), height '5\' 4"'  (1.626 m), weight 211 lb (95.7 kg), SpO2 92 %. Body mass index is 36.22 kg/m.  General: Cooperative, alert, well developed, in no acute distress. HEENT: Conjunctivae and lids unremarkable. Cardiovascular: Regular rhythm.  Lungs: Normal work of breathing. Neurologic: No focal deficits.   Lab Results  Component Value Date   CREATININE 0.72 02/14/2022   BUN 17 02/14/2022   NA 139 02/14/2022   K 3.8 02/14/2022   CL 100 02/14/2022   CO2 24 02/14/2022   Lab Results  Component Value Date   ALT 22 02/14/2022   AST 17 02/14/2022   ALKPHOS 80 02/14/2022   BILITOT 0.5 02/14/2022   Lab Results  Component Value Date   HGBA1C 5.7 06/20/2021   HGBA1C 6.0 03/06/2021   Lab Results  Component Value Date   INSULIN 13.8 02/14/2022   INSULIN 6.4 08/01/2021   INSULIN 7.4 03/28/2021   Lab Results  Component Value Date   TSH 8.66 (A) 06/20/2021   Lab Results  Component Value Date   CHOL 216 (A) 06/20/2021   HDL 61 06/20/2021   LDLCALC 141 06/20/2021   TRIG 80 06/20/2021   Lab Results  Component Value Date   VD25OH 19.6 (L) 02/14/2022   VD25OH 25.7 (L) 08/01/2021   VD25OH 25.0 (L) 03/28/2021   Lab Results  Component Value Date   WBC 5.6 07/17/2021   HGB 13.5 07/17/2021   HCT 41.3 07/17/2021   MCV 90.8 07/17/2021   PLT 265 07/17/2021   Lab  Results  Component Value Date   IRON 39 07/13/2016   TIBC 408 07/13/2016   FERRITIN 11 07/13/2016    Obesity Behavioral Intervention:   Approximately 15 minutes were spent on the discussion below.  ASK: We discussed the diagnosis of obesity with Zenora today and Hawa agreed to give [name] permission to discuss obesity behavioral modification therapy today.  ASSESS: Columbia has the diagnosis of obesity and her BMI today is 36.4. Margie is in the action stage of change.   ADVISE: Hallelujah was educated on the multiple health risks of obesity as well as the benefit of weight loss to improve her health. She was advised of the need for long term treatment and the importance of lifestyle modifications to improve her current health and to decrease her risk of future health problems.  AGREE: Multiple dietary modification options and treatment options were discussed and Modine agreed to follow the recommendations documented in the above note.  ARRANGE: Emagene was educated on the importance of frequent visits to treat obesity as outlined per CMS and USPSTF guidelines and agreed to schedule her next follow up appointment today.  Attestation Statements:   Reviewed by clinician on day of visit: allergies, medications, problem list, medical history, surgical history, family history, social history, and previous encounter notes.   I, Trixie Dredge, am acting as transcriptionist for Dennard Nip, MD.  I have reviewed the above documentation for accuracy and completeness, and I agree with the above. -  Dennard Nip, MD

## 2022-02-25 NOTE — ED Provider Notes (Addendum)
[facility] Emergency Department Provider Note   ____________________________________________   Event Date/Time   First MD Initiated Contact with Patient 02/25/22 1514     (approximate)  I have reviewed the triage vital signs and the nursing notes.   HISTORY  Chief Complaint Leg Swelling    HPI Alexis Christensen is a 51 y.o. female presents to the emergency room with her spouse today. Patient's complaint is bilateral lower extremity swelling. Patient reports that for the past 2 weeks she has had swelling to bilateral lower legs. She reports that she was seen by her cardiologist at [facility] 2 weeks ago and her furosemide was increased from 20 mg a day to 40 mg a day. She reports that the swelling continued and then she began to have pain in bilateral calves.  She returned to cardiology on 10 August and they ordered a venous ultrasound of the right leg.  There was no DVT noted. Patient presents to the emergency room today continuing to complain of bilateral lower extremity swelling. She endorses pain in bilateral calves when standing.  She also states that during the night she will have shooting pains up the backs of both her legs.  Patient also describes neuropathy pain in bilateral feet. She reports the pain as a 6-8 out of 10 and aching/throbbing in nature unless she feels the shooting pain that happens at night when she is asleep. Patient reports that she has not taken anything for the pain other than the furosemide.  On exam patient is noted to have 2+ swelling to bilateral lower extremities nonpitting.  She does have 1+ pitting edema in bilateral ankles. Patient has no pain with palpation. There is no redness/skin discoloration noted to either leg. Negative Homans' sign. Pedal pulses normal. Patient denies any shortness of breath/dyspnea on exertion.  Patient denies any dizziness/headaches.  Patient denies any cough.    Past Medical  History:  Diagnosis Date   Acute diastolic (congestive) heart failure (HCC)    ADHD    Adopted    Anemia    Anxiety    Arthritis    Asthma    B12 deficiency    Bilateral swelling of feet and ankles    Bipolar 1 disorder (HCC)    Bipolar disorder (HCC)    Carpal tunnel syndrome 09/15/2014   Chest pain    CHF (congestive heart failure) ([place])    Dialostic CHF   Chronic diarrhea 09/07/2015   Chronic kidney disease    H/O KIDNEY STONES   Chronic venous insufficiency 04/26/2016   Depression    DVT (deep venous thrombosis) ([place]) 2016   RIGHT LEG   Edema leg    Effusion of knee 12/30/2013   Family history of adverse reaction to anesthesia    ADOPTED   Food allergy    GERD (gastroesophageal reflux disease)    Goiter    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   Headache(784.0)    MIGRAINES   Heart burn    Heart murmur    History of blood clots    History of kidney stones    HLD (hyperlipidemia)    Hx MRSA infection    Hyperlipidemia    Hypothyroidism    Lipoma of arm 05/11/2013   Lower extremity edema    Lymphedema 04/26/2016   Other fatigue    Post traumatic stress disorder (PTSD)    Prediabetes    PVD (peripheral vascular disease) (HCC)    Seasonal allergies  Shortness of breath    Shortness of breath on exertion    Stress incontinence    Thyroid disease    Urge incontinence    Vitamin D deficiency     Patient Active Problem List   Diagnosis Date Noted   Pre-diabetes 01/01/2022   Vitamin D deficiency 73/53/2992   Diastolic congestive heart failure ([place]) 05/23/2021   Bilateral inguinal hernia without obstruction or gangrene    Redness of eye, left 08/22/2016   Iron deficiency anemia 07/11/2016   ANA positive 06/13/2016   Bilateral hand pain 06/13/2016   Lymphedema 04/26/2016   Chronic venous insufficiency 04/26/2016   Swelling of limb 04/26/2016   Chronic diarrhea 09/07/2015   Fever 09/07/2015   Hypothyroidism 09/07/2015   Hypovitaminosis D 09/07/2015    Obesity 09/07/2015   Weight loss, unintentional 09/07/2015   Bipolar mixed affective disorder, moderate ([place]) 09/05/2015   SUI (stress urinary incontinence, female) 05/18/2015   Pain in shoulder 09/15/2014   Carpal tunnel syndrome 09/15/2014   Chronic left shoulder pain 09/15/2014   Effusion of knee 12/30/2013   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   Gonalgia 12/30/2013   Lipoma of arm 05/11/2013    Past Surgical History:  Procedure Laterality Date   ABDOMINAL HYSTERECTOMY     ANKLE SURGERY     CHOLECYSTECTOMY     COLONOSCOPY WITH PROPOFOL N/A 06/24/2019   Procedure: COLONOSCOPY WITH PROPOFOL;  Surgeon: Toledo, Benay Pike, MD;  Location: [facility] ENDOSCOPY;  Service: Gastroenterology;  Laterality: N/A;   CYSTOSCOPY N/A 04/09/2016   Procedure: CYSTOSCOPY;  Surgeon: Bjorn Loser, MD;  Location: [facility] ORS;  Service: Urology;  Laterality: N/A;   DILATION AND CURETTAGE OF UTERUS     ESOPHAGOGASTRODUODENOSCOPY (EGD) WITH PROPOFOL N/A 06/24/2019   Procedure: ESOPHAGOGASTRODUODENOSCOPY (EGD) WITH PROPOFOL;  Surgeon: Toledo, Benay Pike, MD;  Location: [facility] ENDOSCOPY;  Service: Gastroenterology;  Laterality: N/A;   INGUINAL HERNIA REPAIR Bilateral 04/18/2017   Procedure: LAPAROSCOPIC BILATERAL INGUINAL HERNIA REPAIR;  Surgeon: Jules Husbands, MD;  Location: [facility] ORS;  Service: General;  Laterality: Bilateral;   INTERSTIM IMPLANT PLACEMENT N/A 11/26/2016   Procedure: Barrie Lyme IMPLANT FIRST STAGE;  Surgeon: Bjorn Loser, MD;  Location: [facility] ORS;  Service: Urology;  Laterality: N/A;   INTERSTIM IMPLANT PLACEMENT N/A 11/26/2016   Procedure: Barrie Lyme IMPLANT SECOND STAGE;  Surgeon: Bjorn Loser, MD;  Location: [facility] ORS;  Service: Urology;  Laterality: N/A;   JOINT REPLACEMENT Right    knee   KNEE ARTHROSCOPY Bilateral    KNEE ARTHROSCOPY WITH LATERAL RELEASE Left 10/18/2015   Procedure: KNEE ARTHROSCOPY LATERAL AND PARTIAL SYNOVECTOMY;  Surgeon: Hessie Knows, MD;  Location: [facility] ORS;   Service: Orthopedics;  Laterality: Left;   Lymph Node removal  2015   Neck   PUBOVAGINAL SLING N/A 04/09/2016   Procedure: PUBO-VAGINAL SLING/ RETROPUBIC SLING;  Surgeon: Bjorn Loser, MD;  Location: [facility] ORS;  Service: Urology;  Laterality: N/A;   REPLACEMENT TOTAL KNEE Right    SHOULDER SURGERY Right 2014   TUBAL LIGATION     WRIST SURGERY Right    metal plate    Prior to Admission medications   Medication Sig Start Date End Date Taking? Authorizing Provider  amitriptyline (ELAVIL) 75 MG tablet Take 1 tablet by mouth at bedtime. 01/23/21   [provider]  amoxicillin (AMOXIL) 500 MG tablet Take 1 tablet (500 mg total) by mouth 3 (three) times daily. 01/21/22   Triplett, Cari B, FNP  ARIPiprazole (ABILIFY) 20 MG tablet Take 20 mg by mouth at  bedtime as needed. 09/19/21   [provider]  aspirin 81 MG chewable tablet Aspirin 81 MG Oral Tablet Chewable QTY: 30 tablet Days: 30 Refills: 0  Written: 01/23/21 Patient Instructions: once a day 01/23/21   [provider]  colestipol (COLESTID) 1 g tablet Take 2 g by mouth 2 (two) times daily.    [provider]  dicyclomine (BENTYL) 10 MG capsule Take 10 mg by mouth every 8 (eight) hours as needed. 11/19/20   [provider]  DULoxetine (CYMBALTA) 60 MG capsule Take 60 mg by mouth 2 (two) times daily.    [provider]  furosemide (LASIX) 20 MG tablet Take 40 mg by mouth daily. 02/19/19   [provider]  gabapentin (NEURONTIN) 600 MG tablet Take 600 mg by mouth 2 (two) times daily.     [provider]  hydrOXYzine (ATARAX/VISTARIL) 25 MG tablet Take 50 mg by mouth every 8 (eight) hours as needed for itching.    [provider]  levothyroxine (SYNTHROID) 175 MCG tablet Take 175 mcg by mouth daily before breakfast.    [provider]  lisinopril (ZESTRIL) 5 MG tablet Take 5 mg by mouth daily. 02/03/21   [provider]  metFORMIN (GLUCOPHAGE) 500 MG tablet  Take 1 tablet (500 mg total) by mouth 3 (three) times daily. 02/14/22   Dennard Nip D, MD  methylPREDNISolone (MEDROL DOSEPAK) 4 MG TBPK tablet 6 tablets on day one and decrease by one tablet each day until finished. 01/21/22   Sherrie George B, FNP  nystatin (MYCOSTATIN/NYSTOP) powder SMARTSIG:Packet(s) Topical Every 8 Hours 02/16/21   [provider]  ondansetron (ZOFRAN) 4 MG tablet Take 1 tablet (4 mg total) by mouth daily as needed for nausea or vomiting. 07/17/21   Harvest Dark, MD  pantoprazole (PROTONIX) 20 MG tablet Take 20 mg by mouth daily. 11/18/20   [provider]  phenazopyridine (PYRIDIUM) 100 MG tablet Take 1 tablet (100 mg total) by mouth 3 (three) times daily with meals. 08/27/21   Johnn Hai, PA-C  rosuvastatin (CRESTOR) 40 MG tablet Take 40 mg by mouth daily.    [provider]  SUBOXONE 8-2 MG FILM Dissolve 1 film under tongue twice daily 09/14/16   [provider]  triamcinolone cream (KENALOG) 0.1 % in the morning and at bedtime. Use BID for up to 10 days. 03/24/21   [provider]  vitamin B-12 (CYANOCOBALAMIN) 500 MCG tablet Take 1 tablet (500 mcg total) by mouth daily. 10/31/21   Mellody Dance, DO  Vitamin D, Ergocalciferol, (DRISDOL) 1.25 MG (50000 UNIT) CAPS capsule 1 po q wed and 1 po q sun 02/14/22   Dennard Nip D, MD    Allergies Geodon [ziprasidone hydrochloride], Sulfacetamide sodium, Ziprasidone, Ziprasidone hcl, Ace inhibitors, Erythromycin, Hydromorphone, Lamictal [lamotrigine], Strawberry (diagnostic), and Sulfa antibiotics  Family History  Adopted: Yes  Family history unknown: Yes    Social History Social History   Tobacco Use   Smoking status: Former    Packs/day: 0.50    Years: 10.00    Total pack years: 5.00    Types: Cigarettes    Start date: 07/16/2008    Quit date: 07/16/2019    Years since quitting: 2.6   Smokeless tobacco: Former   Tobacco comments:    STRESS RELATED  Vaping Use    Vaping Use: Never used  Substance Use Topics   Alcohol use: Not Currently    Comment: Hisotry of ETOH abouse ASB patient   Drug  use: Yes    Types: Other-see comments    Comment: Pt listed history of abuse of "prescription drugs"    Review of Systems  Constitutional: No fever/chills Eyes: No visual changes. ENT: No sore throat. Cardiovascular: Denies chest pain. Respiratory: Denies shortness of breath. Gastrointestinal: No abdominal pain.  No nausea, no vomiting.  No diarrhea.  No constipation. Genitourinary: Negative for dysuria. Musculoskeletal: Negative for back pain.  Positive for swelling to bilateral lower extremities.  Positive for pain to bilateral lower extremities. Skin: Negative for rash. Neurological: Negative for headaches, focal weakness or numbness.   ____________________________________________   PHYSICAL EXAM:  VITAL SIGNS: ED Triage Vitals  Enc Vitals Group     BP 02/25/22 1434 (!) 144/76     Pulse Rate 02/25/22 1434 66     Resp 02/25/22 1434 20     Temp 02/25/22 1434 98.4 F (36.9 C)     Temp src --      SpO2 02/25/22 1434 96 %     Weight 02/25/22 1431 200 lb (90.7 kg)     Height 02/25/22 1431 '5\' 5"'$  (1.651 m)     Head Circumference --      Peak Flow --      Pain Score 02/25/22 1431 8     Pain Loc --      Pain Edu? --      Excl. in [place]? --     Constitutional: Alert and oriented. Well appearing and in no acute distress. Eyes: Conjunctivae are normal. PERRL. EOMI. Head: Atraumatic. Nose: No congestion/rhinnorhea. Mouth/Throat: Mucous membranes are moist.  Oropharynx non-erythematous. Neck: No stridor.   Cardiovascular: Normal rate, regular rhythm. Grossly normal heart sounds.  Good peripheral circulation. Respiratory: Normal respiratory effort.  No retractions. Lungs CTAB. Gastrointestinal: Soft and nontender. No distention. No abdominal bruits. No CVA tenderness. Musculoskeletal: Patient has +2 nonpitting edema noted to bilateral lower  extremities.  She has 1+ pitting edema noted to bilateral ankles.  Patient is not tender to palpation.  There is no redness/skin discoloration to bilateral lower extremities.  There is no warmth to bilateral lower extremities.  Negative Homans' sign. Neurologic:  Normal speech and language. No gross focal neurologic deficits are appreciated. No gait instability. Skin:  Skin is warm, dry and intact. No rash noted. Psychiatric: Mood and affect are normal. Speech and behavior are normal.  ____________________________________________   LABS (all labs ordered are listed, but only abnormal results are displayed)  Labs Reviewed  BASIC METABOLIC PANEL - Abnormal; Notable for the following components:      Result Value   Potassium 3.4 (*)    All other components within normal limits  CBC  BRAIN NATRIURETIC PEPTIDE  URINALYSIS, ROUTINE W REFLEX MICROSCOPIC   ____________________________________________  EKG   ____________________________________________  RADIOLOGY  ED MD interpretation:    Official radiology report(s): No results found.  ____________________________________________   PROCEDURES  Procedure(s) performed: None  Procedures  Critical Care performed: No  ____________________________________________   INITIAL IMPRESSION / ASSESSMENT AND PLAN / ED COURSE     Alexis Christensen is a 51 y.o. female presents to the emergency room with her spouse today. Patient's complaint is bilateral lower extremity swelling. Patient reports that for the past 2 weeks she has had swelling to bilateral lower legs. She reports that she was seen by her cardiologist at [facility] 2 weeks ago and her furosemide was increased from 20 mg a day to 40 mg a day. She reports that the swelling continued  and then she began to have pain in bilateral calves.  She returned to cardiology on 10 August and they ordered a venous ultrasound of the right leg.  There was no DVT noted. Patient  presents to the emergency room today continuing to complain of bilateral lower extremity swelling. She endorses pain in bilateral calves when standing.  She also states that during the night she will have shooting pains up the backs of both her legs.  Patient also describes neuropathy pain in bilateral feet.  On exam patient is noted to have 2+ swelling to bilateral lower extremities nonpitting.  She does have 1+ pitting edema in bilateral ankles. Patient has no pain with palpation. There is no redness/skin discoloration noted to either leg. Negative Homans' sign. Pedal pulses normal. Patient denies any shortness of breath/dyspnea on exertion.  Patient denies any dizziness/headaches.  Patient denies any cough Symptoms have not changed since last appointment with cardiology.  She is just concerned since the swelling has not improved and she is continuing to have pain in bilateral lower extremities..  Patient has history of congestive heart failure however no symptoms are noted/reported today and BNP is normal. Patient is PERC negative  CBC is normal, BMP is normal, BNP is also normal. Vital signs are stable.  O2 saturation is 96%, heart rate is 66, BP 144/76.  Lungs are clear to auscultation bilaterally in all lung fields.  Patient is currently on furosemide 40 mg daily.  She is also taking gabapentin 600 mg twice a day.  Patient is encouraged to decrease salt intake.  I also discussed with her that she should review sodium content of canned foods/drinks.  She should also be sure that she is taking at least 64 ounces of water a day.  Based on reassuring labs and reassuring exam, I will discharge patient home in stable condition at this time.  She should follow-up with her primary care doctor and/or cardiology this week. If she starts to experience any shortness of breath, dyspnea on exertion, dizziness, change in swelling/redness to the extremities she should report back to the emergency room or  urgent care.      ____________________________________________   FINAL CLINICAL IMPRESSION(S) / ED DIAGNOSES  Final diagnoses:  Bilateral lower extremity edema  Pain in both lower legs     ED Discharge Orders     None        Note:  This document was prepared using Dragon voice recognition software and may include unintentional dictation errors.     Willaim Rayas, NP 02/25/22 1547    Willaim Rayas, NP 02/25/22 1550    Merlyn Lot, MD 02/25/22 1556

## 2022-02-26 ENCOUNTER — Telehealth: Payer: Self-pay | Admitting: *Deleted

## 2022-02-26 ENCOUNTER — Encounter (INDEPENDENT_AMBULATORY_CARE_PROVIDER_SITE_OTHER): Payer: Self-pay

## 2022-02-26 NOTE — Telephone Encounter (Signed)
        Patient  visited [facility] 02/25/2022  for edema   Telephone encounter attempt :  1st  A HIPAA compliant voice message was left requesting a return call.  Instructed patient to call back at [phone]. [place] [phone] [address] , [place] [address] Email : [email or web address]

## 2022-02-27 ENCOUNTER — Telehealth: Payer: Self-pay | Admitting: *Deleted

## 2022-02-27 NOTE — Telephone Encounter (Signed)
      Patient  visit on 02/25/2022  at [facility] ED was for edema    Telephone encounter attempt : 2nd   A HIPAA compliant voice message was left requesting a return call.  Instructed patient to call back at [phone].  [place] [phone] [address] , [place] [address] Email : [email or web address]

## 2022-02-28 DIAGNOSIS — R0602 Shortness of breath: Secondary | ICD-10-CM | POA: Diagnosis not present

## 2022-03-02 ENCOUNTER — Telehealth: Payer: Self-pay | Admitting: *Deleted

## 2022-03-02 DIAGNOSIS — E785 Hyperlipidemia, unspecified: Secondary | ICD-10-CM | POA: Diagnosis not present

## 2022-03-02 DIAGNOSIS — I10 Essential (primary) hypertension: Secondary | ICD-10-CM | POA: Diagnosis not present

## 2022-03-02 DIAGNOSIS — I509 Heart failure, unspecified: Secondary | ICD-10-CM | POA: Diagnosis not present

## 2022-03-02 DIAGNOSIS — R609 Edema, unspecified: Secondary | ICD-10-CM | POA: Diagnosis not present

## 2022-03-02 NOTE — Telephone Encounter (Signed)
     Patient  visit on 02/25/2022  at [facility] ED  was for DVt  Have you been able to follow up with your primary care physician? Yes   The patient was able to obtain any needed medicine or equipment.  Are there diet recommendations that you are having difficulty following?  Patient expresses understanding of discharge instructions and education provided has no other needs at this time.   [place] [phone] [address] , [place] [address] Email : [email or web address]

## 2022-03-07 DIAGNOSIS — I10 Essential (primary) hypertension: Secondary | ICD-10-CM | POA: Diagnosis not present

## 2022-03-07 DIAGNOSIS — I509 Heart failure, unspecified: Secondary | ICD-10-CM | POA: Diagnosis not present

## 2022-03-07 DIAGNOSIS — N390 Urinary tract infection, site not specified: Secondary | ICD-10-CM | POA: Diagnosis not present

## 2022-03-15 DIAGNOSIS — E785 Hyperlipidemia, unspecified: Secondary | ICD-10-CM | POA: Diagnosis not present

## 2022-03-15 DIAGNOSIS — E039 Hypothyroidism, unspecified: Secondary | ICD-10-CM | POA: Diagnosis not present

## 2022-03-15 DIAGNOSIS — I10 Essential (primary) hypertension: Secondary | ICD-10-CM | POA: Diagnosis not present

## 2022-03-20 DIAGNOSIS — G8929 Other chronic pain: Secondary | ICD-10-CM | POA: Diagnosis not present

## 2022-03-20 DIAGNOSIS — Z79899 Other long term (current) drug therapy: Secondary | ICD-10-CM | POA: Diagnosis not present

## 2022-03-21 ENCOUNTER — Encounter (INDEPENDENT_AMBULATORY_CARE_PROVIDER_SITE_OTHER): Payer: Self-pay | Admitting: Family Medicine

## 2022-03-21 ENCOUNTER — Ambulatory Visit (INDEPENDENT_AMBULATORY_CARE_PROVIDER_SITE_OTHER): Payer: Medicare Other | Admitting: Family Medicine

## 2022-03-21 VITALS — BP 105/71 | HR 60 | Temp 98.0°F | Ht 64.0 in | Wt 204.0 lb

## 2022-03-21 DIAGNOSIS — E559 Vitamin D deficiency, unspecified: Secondary | ICD-10-CM

## 2022-03-21 DIAGNOSIS — R7303 Prediabetes: Secondary | ICD-10-CM

## 2022-03-21 DIAGNOSIS — Z6835 Body mass index (BMI) 35.0-35.9, adult: Secondary | ICD-10-CM | POA: Diagnosis not present

## 2022-03-21 DIAGNOSIS — E669 Obesity, unspecified: Secondary | ICD-10-CM | POA: Diagnosis not present

## 2022-03-21 MED ORDER — VITAMIN D (ERGOCALCIFEROL) 1.25 MG (50000 UNIT) PO CAPS: ORAL_CAPSULE | ORAL | 0 refills | Status: DC

## 2022-03-21 MED ORDER — METFORMIN HCL 500 MG PO TABS: 500.0000 mg | ORAL_TABLET | Freq: Three times a day (TID) | ORAL | 0 refills | Status: DC

## 2022-03-27 ENCOUNTER — Other Ambulatory Visit (INDEPENDENT_AMBULATORY_CARE_PROVIDER_SITE_OTHER): Payer: Self-pay | Admitting: Nurse Practitioner

## 2022-03-27 DIAGNOSIS — R300 Dysuria: Secondary | ICD-10-CM | POA: Diagnosis not present

## 2022-03-27 DIAGNOSIS — N390 Urinary tract infection, site not specified: Secondary | ICD-10-CM | POA: Diagnosis not present

## 2022-03-27 DIAGNOSIS — M7989 Other specified soft tissue disorders: Secondary | ICD-10-CM

## 2022-03-28 NOTE — Progress Notes (Signed)
Chief Complaint:   OBESITY Alexis Christensen is here to discuss her progress with her obesity treatment plan along with follow-up of her obesity related diagnoses. Alexis Christensen is on the Category 1 Plan and states she is following her eating plan approximately 90% of the time. Alexis Christensen states she is walking 30 minutes 3 times per week.  Today's visit was #: 14 Starting weight: 229 lbs Starting date: 03/28/2021 Today's weight: 204 lbs Today's date: 03/21/2022 Total lbs lost to date: 25 Total lbs lost since last in-office visit: 7  Interim History: Alexis Christensen is here for a follow up office visit.  We reviewed her meal plan and all questions were answered.  Patient's food recall appears to be accurate and consistent with what is on plan when she is following it.   When eating on plan, her hunger and cravings are well controlled.   Alexis Christensen saw Dr. Leafy Ro for her last OV and had labs drawn- CMP, fasting insulin, Vit D. I do not see labs documented (A1c recently obtained at PCP's office) in chart.  Subjective:   1. Pre-diabetes Discussed labs with patient today. At [place] last OV, we increased Metformin to three times a day and is tolerating it well. It helped with cravings, and she lost weight and was very successful.  2. Vitamin D deficiency Worsening. Discussed labs with patient today. Pt compliance is good. She takes supplement weekly on Wed and Sunday. Labs showed worsening Vit D level at 19.6.  Assessment/Plan:  No orders of the defined types were placed in this encounter.   Medications Discontinued During This Encounter  Medication Reason   Vitamin D, Ergocalciferol, (DRISDOL) 1.25 MG (50000 UNIT) CAPS capsule Reorder   metFORMIN (GLUCOPHAGE) 500 MG tablet Reorder     Meds ordered this encounter  Medications   metFORMIN (GLUCOPHAGE) 500 MG tablet    Sig: Take 1 tablet (500 mg total) by mouth 3 (three) times daily.    Dispense:  90 tablet    Refill:  0    30 d supply;  ** OV for RF **   Do not  send RF request   Vitamin D, Ergocalciferol, (DRISDOL) 1.25 MG (50000 UNIT) CAPS capsule    Sig: 1 po q wed and 1 po q sun    Dispense:  8 capsule    Refill:  0    30 d supply;  ** OV for RF **   Do not send RF request     1. Pre-diabetes Worsening fasting insulin is now up to 13.8 from 6.4. Alexis Christensen will continue to work on weight loss, exercise, and decreasing simple carbohydrates to help decrease the risk of diabetes.   Refill- metFORMIN (GLUCOPHAGE) 500 MG tablet; Take 1 tablet (500 mg total) by mouth 3 (three) times daily.  Dispense: 90 tablet; Refill: 0  2. Vitamin D deficiency Low Vitamin D level contributes to fatigue and are associated with obesity, breast, and colon cancer. She agrees to continue to take prescription Vitamin D '@50'$ ,000 IU twice a week and will follow-up for routine testing of Vitamin D, at least 2-3 times per year to avoid over-replacement.  Refill- Vitamin D, Ergocalciferol, (DRISDOL) 1.25 MG (50000 UNIT) CAPS capsule; 1 po q wed and 1 po q sun  Dispense: 8 capsule; Refill: 0  3. Obesity, Current BMI 35.2 Alexis Christensen is currently in the action stage of change. As such, her goal is to continue with weight loss efforts. She has agreed to the Category 1 Plan.  Pt lives over 35 minutes away and money is tight, therefore, she will do telemed every other visit moving forward.  Exercise goals:  Start at [facility], doing water aerobics plus 30 minutes of walking 3 days a week.  Behavioral modification strategies: increasing lean protein intake, no skipping meals, and avoiding temptations.  Alexis Christensen has agreed to follow-up with our clinic in 3-4 weeks with FNP Dawn via telemed; then with Dr. [name] 3-4 weeks later for repeat IC. She was informed of the importance of frequent follow-up visits to maximize her success with intensive lifestyle modifications for her multiple health conditions.   Objective:   Blood pressure 105/71, pulse 60, temperature 98 F (36.7 C), height '5\' 4"'$  (1.626  m), weight 204 lb (92.5 kg), SpO2 98 %. Body mass index is 35.02 kg/m.  General: Cooperative, alert, well developed, in no acute distress. HEENT: Conjunctivae and lids unremarkable. Cardiovascular: Regular rhythm.  Lungs: Normal work of breathing. Neurologic: No focal deficits.   Lab Results  Component Value Date   CREATININE 0.69 02/25/2022   BUN 15 02/25/2022   NA 140 02/25/2022   K 3.4 (L) 02/25/2022   CL 101 02/25/2022   CO2 29 02/25/2022   Lab Results  Component Value Date   ALT 22 02/14/2022   AST 17 02/14/2022   ALKPHOS 80 02/14/2022   BILITOT 0.5 02/14/2022   Lab Results  Component Value Date   HGBA1C 5.7 06/20/2021   HGBA1C 6.0 03/06/2021   Lab Results  Component Value Date   INSULIN 13.8 02/14/2022   INSULIN 6.4 08/01/2021   INSULIN 7.4 03/28/2021   Lab Results  Component Value Date   TSH 8.66 (A) 06/20/2021   Lab Results  Component Value Date   CHOL 216 (A) 06/20/2021   HDL 61 06/20/2021   LDLCALC 141 06/20/2021   TRIG 80 06/20/2021   Lab Results  Component Value Date   VD25OH 19.6 (L) 02/14/2022   VD25OH 25.7 (L) 08/01/2021   VD25OH 25.0 (L) 03/28/2021   Lab Results  Component Value Date   WBC 6.2 02/25/2022   HGB 12.4 02/25/2022   HCT 38.8 02/25/2022   MCV 89.2 02/25/2022   PLT 339 02/25/2022   Lab Results  Component Value Date   IRON 39 07/13/2016   TIBC 408 07/13/2016   FERRITIN 11 07/13/2016    Obesity Behavioral Intervention:   Approximately 15 minutes were spent on the discussion below.  ASK: We discussed the diagnosis of obesity with Alexis Christensen today and Alexis Christensen agreed to give [name] permission to discuss obesity behavioral modification therapy today.  ASSESS: Alexis Christensen has the diagnosis of obesity and her BMI today is 35.2. Alexis Christensen is in the action stage of change.   ADVISE: Alexis Christensen was educated on the multiple health risks of obesity as well as the benefit of weight loss to improve her health. She was advised of the need for long term  treatment and the importance of lifestyle modifications to improve her current health and to decrease her risk of future health problems.  AGREE: Multiple dietary modification options and treatment options were discussed and Alexis Christensen agreed to follow the recommendations documented in the above note.  ARRANGE: Alexis Christensen was educated on the importance of frequent visits to treat obesity as outlined per CMS and USPSTF guidelines and agreed to schedule her next follow up appointment today.  Attestation Statements:   Reviewed by clinician on day of visit: allergies, medications, problem list, medical history, surgical history, family history, social history, and previous encounter  notes.  I, Kathlene November, BS, CMA, am acting as transcriptionist for [employer], DO.  I have reviewed the above documentation for accuracy and completeness, and I agree with the above. Marjory Sneddon, D.O.  The [place] was signed into law in 2016 which includes the topic of electronic health records.  This provides immediate access to information in MyChart.  This includes consultation notes, operative notes, office notes, lab results and pathology reports.  If you have any questions about what you read please let us know at your next visit so we can discuss your concerns and take corrective action if need be.  We are right here with you.

## 2022-03-29 ENCOUNTER — Ambulatory Visit (INDEPENDENT_AMBULATORY_CARE_PROVIDER_SITE_OTHER): Payer: Medicare Other | Admitting: Nurse Practitioner

## 2022-03-29 ENCOUNTER — Ambulatory Visit (INDEPENDENT_AMBULATORY_CARE_PROVIDER_SITE_OTHER): Payer: Medicare Other

## 2022-03-29 ENCOUNTER — Encounter (INDEPENDENT_AMBULATORY_CARE_PROVIDER_SITE_OTHER): Payer: Self-pay | Admitting: Nurse Practitioner

## 2022-03-29 VITALS — BP 116/76 | HR 51 | Resp 18 | Ht 64.0 in | Wt 209.8 lb

## 2022-03-29 DIAGNOSIS — M7989 Other specified soft tissue disorders: Secondary | ICD-10-CM | POA: Diagnosis not present

## 2022-03-29 DIAGNOSIS — I890 Lymphedema, not elsewhere classified: Secondary | ICD-10-CM | POA: Diagnosis not present

## 2022-03-29 DIAGNOSIS — I5030 Unspecified diastolic (congestive) heart failure: Secondary | ICD-10-CM

## 2022-03-29 DIAGNOSIS — M79604 Pain in right leg: Secondary | ICD-10-CM | POA: Diagnosis not present

## 2022-03-29 DIAGNOSIS — M79605 Pain in left leg: Secondary | ICD-10-CM | POA: Diagnosis not present

## 2022-03-29 NOTE — Progress Notes (Signed)
Subjective:    Patient ID: Alexis Christensen, female    DOB: 1971/05/03, 51 y.o.   MRN: [id number] No chief complaint on file.   The patient is a 51 year old female previously seen in 2020 for lymphedema.  The patient notes that for the last few weeks that she has been having severe pain in her lower extremities.  She notes that she has swelling and becomes very tender to touch and very difficult to put on shoes or walk.  The patient does have known lymphedema.  She notes that initially she was very diligent with the use of compression socks but she started having issues with swelling.  Due to this the patient does not wear compression.  She also has a lymphedema pump but she notes that she does this for an hour twice a day.  Currently there are no open wounds or ulcerations.  She denies any fevers or chills.  Today noninvasive study showed no evidence of DVT or superficial thrombophlebitis bilaterally.  There is evidence of deep venous insufficiency in right lower extremity.    Review of Systems  Cardiovascular:  Positive for leg swelling.  All other systems reviewed and are negative.      Objective:   Physical Exam Vitals reviewed.  HENT:     Head: Normocephalic.  Cardiovascular:     Rate and Rhythm: Normal rate.     Pulses:          Dorsalis pedis pulses are 1+ on the right side and 1+ on the left side.  Pulmonary:     Effort: Pulmonary effort is normal.  Skin:    General: Skin is warm and dry.  Neurological:     Mental Status: She is alert and oriented to person, place, and time.  Psychiatric:        Mood and Affect: Mood normal.        Behavior: Behavior normal.        Thought Content: Thought content normal.        Judgment: Judgment normal.     BP 116/76 (BP Location: Left Arm)   Pulse (!) 51   Resp 18   Ht '5\' 4"'$  (1.626 m)   Wt 209 lb 12.8 oz (95.2 kg)   BMI 36.01 kg/m   Past Medical History:  Diagnosis Date   Acute diastolic (congestive) heart failure (HCC)     ADHD    Adopted    Anemia    Anxiety    Arthritis    Asthma    B12 deficiency    Bilateral swelling of feet and ankles    Bipolar 1 disorder (HCC)    Bipolar disorder (HCC)    Carpal tunnel syndrome 09/15/2014   Chest pain    CHF (congestive heart failure) ([place])    Dialostic CHF   Chronic diarrhea 09/07/2015   Chronic kidney disease    H/O KIDNEY STONES   Chronic venous insufficiency 04/26/2016   Depression    DVT (deep venous thrombosis) ([place]) 2016   RIGHT LEG   Edema leg    Effusion of knee 12/30/2013   Family history of adverse reaction to anesthesia    ADOPTED   Food allergy    GERD (gastroesophageal reflux disease)    Goiter    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   Headache(784.0)    MIGRAINES   Heart burn    Heart murmur    History of blood clots    History of kidney stones  HLD (hyperlipidemia)    Hx MRSA infection    Hyperlipidemia    Hypothyroidism    Lipoma of arm 05/11/2013   Lower extremity edema    Lymphedema 04/26/2016   Other fatigue    Post traumatic stress disorder (PTSD)    Prediabetes    PVD (peripheral vascular disease) (HCC)    Seasonal allergies    Shortness of breath    Shortness of breath on exertion    Stress incontinence    Thyroid disease    Urge incontinence    Vitamin D deficiency     Social History   Socioeconomic History   Marital status: Divorced    Spouse name: Not on file   Number of children: Not on file   Years of education: Not on file   Highest education level: Not on file  Occupational History   Occupation: Customer Service    Employer: [employer]  Tobacco Use   Smoking status: Former    Packs/day: 0.50    Years: 10.00    Total pack years: 5.00    Types: Cigarettes    Start date: 07/16/2008    Quit date: 07/16/2019    Years since quitting: 2.7   Smokeless tobacco: Former   Tobacco comments:    STRESS RELATED  Vaping Use   Vaping Use: Never used  Substance and Sexual Activity   Alcohol use: Not  Currently    Comment: Hisotry of ETOH abouse ASB patient   Drug use: Yes    Types: Other-see comments    Comment: Pt listed history of abuse of "prescription drugs"   Sexual activity: Not Currently  Other Topics Concern   Not on file  Social History Narrative   Not on file   Social Determinants of Health   Financial Resource Strain: Not on file  Food Insecurity: Not on file  Transportation Needs: Not on file  Physical Activity: Not on file  Stress: Not on file  Social Connections: Not on file  Intimate Partner Violence: Not on file    Past Surgical History:  Procedure Laterality Date   ABDOMINAL HYSTERECTOMY     ANKLE SURGERY     CHOLECYSTECTOMY     COLONOSCOPY WITH PROPOFOL N/A 06/24/2019   Procedure: COLONOSCOPY WITH PROPOFOL;  Surgeon: Toledo, Benay Pike, MD;  Location: [facility];  Service: Gastroenterology;  Laterality: N/A;   CYSTOSCOPY N/A 04/09/2016   Procedure: CYSTOSCOPY;  Surgeon: Bjorn Loser, MD;  Location: [facility];  Service: Urology;  Laterality: N/A;   DILATION AND CURETTAGE OF UTERUS     ESOPHAGOGASTRODUODENOSCOPY (EGD) WITH PROPOFOL N/A 06/24/2019   Procedure: ESOPHAGOGASTRODUODENOSCOPY (EGD) WITH PROPOFOL;  Surgeon: Toledo, Benay Pike, MD;  Location: [facility];  Service: Gastroenterology;  Laterality: N/A;   INGUINAL HERNIA REPAIR Bilateral 04/18/2017   Procedure: LAPAROSCOPIC BILATERAL INGUINAL HERNIA REPAIR;  Surgeon: Jules Husbands, MD;  Location: [facility];  Service: General;  Laterality: Bilateral;   INTERSTIM IMPLANT PLACEMENT N/A 11/26/2016   Procedure: Barrie Lyme IMPLANT FIRST STAGE;  Surgeon: Bjorn Loser, MD;  Location: [facility];  Service: Urology;  Laterality: N/A;   INTERSTIM IMPLANT PLACEMENT N/A 11/26/2016   Procedure: Barrie Lyme IMPLANT SECOND STAGE;  Surgeon: Bjorn Loser, MD;  Location: [facility];  Service: Urology;  Laterality: N/A;   JOINT REPLACEMENT Right    knee   KNEE ARTHROSCOPY Bilateral    KNEE ARTHROSCOPY WITH LATERAL  RELEASE Left 10/18/2015   Procedure: KNEE ARTHROSCOPY LATERAL AND PARTIAL SYNOVECTOMY;  Surgeon: Hessie Knows, MD;  Location: [facility];  Service: Orthopedics;  Laterality: Left;   Lymph Node removal  2015   Neck   PUBOVAGINAL SLING N/A 04/09/2016   Procedure: PUBO-VAGINAL SLING/ RETROPUBIC SLING;  Surgeon: Bjorn Loser, MD;  Location: [facility];  Service: Urology;  Laterality: N/A;   REPLACEMENT TOTAL KNEE Right    SHOULDER SURGERY Right 2014   TUBAL LIGATION     WRIST SURGERY Right    metal plate    Family History  Adopted: Yes  Family history unknown: Yes    Allergies  Allergen Reactions   Geodon [Ziprasidone Hydrochloride] Other (See Comments)    Numbness ,sob, headaches, blurred vision   Sulfacetamide Sodium Hives   Ziprasidone Anaphylaxis    Other reaction(s): Other (See Comments) Numbness ,sob, headaches, blurred vision  Other reaction(s): Other (See Comments), Unknown Numbness ,sob, headaches, blurred vision Numbness ,sob, headaches, blurred vision  Other reaction(s): Other (See Comments), Unknown Numbness ,sob, headaches, blurred vision Numbness ,sob, headaches, blurred vision   Numbness ,sob, headaches, blurred vision Other reaction(s): Other (See Comments), Unknown Numbness ,sob, headaches, blurred vision Numbness ,sob, headaches, blurred vision Other reaction(s): Other (See Comments), Unknown Numbness ,sob, headaches, blurred vision Numbness ,sob, headaches, blurred vision   Ziprasidone Hcl Anaphylaxis and Other (See Comments)    Other reaction(s): Other (See Comments), Unknown Numbness ,sob, headaches, blurred vision Numbness ,sob, headaches, blurred vision   Ace Inhibitors Rash   Erythromycin Rash and Hives   Hydromorphone Rash   Lamictal [Lamotrigine] Rash    Other reaction(s): Unknown   Strawberry (Diagnostic) Rash   Sulfa Antibiotics Hives and Rash    Other reaction(s): Unknown Other reaction(s): Unknown Other reaction(s): Unknown        Latest Ref Rng & Units 02/25/2022    2:35 PM 07/17/2021    9:10 AM 06/20/2021   12:00 AM  CBC  WBC 4.0 - 10.5 K/uL 6.2  5.6  6.6   Hemoglobin 12.0 - 15.0 g/dL 12.4  13.5  11.6   Hematocrit 36.0 - 46.0 % 38.8  41.3  34   Platelets 150 - 400 K/uL 339  265        CMP     Component Value Date/Time   NA 140 02/25/2022 1435   NA 139 02/14/2022 1234   NA 138 11/07/2012 0517   K 3.4 (L) 02/25/2022 1435   K 3.9 11/07/2012 0517   CL 101 02/25/2022 1435   CL 109 (H) 11/07/2012 0517   CO2 29 02/25/2022 1435   CO2 25 11/07/2012 0517   GLUCOSE 91 02/25/2022 1435   GLUCOSE 91 11/07/2012 0517   BUN 15 02/25/2022 1435   BUN 17 02/14/2022 1234   BUN 7 11/07/2012 0517   CREATININE 0.69 02/25/2022 1435   CREATININE 0.76 11/07/2012 0517   CALCIUM 8.9 02/25/2022 1435   CALCIUM 7.9 (L) 11/07/2012 0517   PROT 6.3 02/14/2022 1234   ALBUMIN 3.9 02/14/2022 1234   AST 17 02/14/2022 1234   ALT 22 02/14/2022 1234   ALKPHOS 80 02/14/2022 1234   BILITOT 0.5 02/14/2022 1234   GFRNONAA >60 02/25/2022 1435   GFRNONAA >60 11/07/2012 0517   GFRAA >60 02/17/2020 0000   GFRAA >60 11/07/2012 0517     No results found.     Assessment & Plan:   1. Lymphedema It is possible that the inflammation from the swelling is causing her pain and discomfort.  Because she is not able to tolerate her compression socks currently we will place the patient in Unna boots to gain control of  the swelling.  Patient is instructed that lymphedema is a chronic condition and requires the use of conservative therapy consistently.  Without use of consistent conservative therapy the patient can have flares resulting in increased swelling pain and discomfort.  Conservative therapy for lymphedema typically consist of utilizing medical grade compression on a daily basis.  20 to 30 mmHg.  The patient should also elevate her legs when she is not active as well as attempt to gain 20 to 30 minutes of exercise 4 to 5 days a week.  The patient  is compliant with her lymphedema pump.  She should continue to use that twice daily.  The patient will be getting wraps to be changed on a weekly basis.  We will reevaluate swelling in 4 weeks  2. Pain in both lower extremities The patient's description of pain is not quite consistent with typical PAD symptoms however she does have risk factors for atherosclerosis as well as diminished pulses.  We will evaluate the patient's arterial circulation at her follow-up to ensure there is no arterial component to her lower extremity pain.  Based on patient description of pain it may not be 100% related to her swelling.  There are noted Baker's cyst but this would not be the likely cause of her discomfort.  We were able to continue her swelling and she still continues to have severe pain patient will be expected to have further work-up done by her PCP.  3. Diastolic congestive heart failure, unspecified HF chronicity ([place]) This can also contribute to patient's lower extremity edema.    Current Outpatient Medications on File Prior to Visit  Medication Sig Dispense Refill   amitriptyline (ELAVIL) 75 MG tablet Take 1 tablet by mouth at bedtime.     ARIPiprazole (ABILIFY) 20 MG tablet Take 20 mg by mouth at bedtime as needed.     aspirin 81 MG chewable tablet Aspirin 81 MG Oral Tablet Chewable QTY: 30 tablet Days: 30 Refills: 0  Written: 01/23/21 Patient Instructions: once a day     colestipol (COLESTID) 1 g tablet Take 2 g by mouth 2 (two) times daily.     dicyclomine (BENTYL) 10 MG capsule Take 10 mg by mouth every 8 (eight) hours as needed.     DULoxetine (CYMBALTA) 60 MG capsule Take 60 mg by mouth 2 (two) times daily.     furosemide (LASIX) 20 MG tablet Take 40 mg by mouth daily.     gabapentin (NEURONTIN) 600 MG tablet Take 600 mg by mouth 2 (two) times daily.      hydrOXYzine (ATARAX/VISTARIL) 25 MG tablet Take 50 mg by mouth every 8 (eight) hours as needed for itching.     KLOR-CON M10 10 MEQ tablet  Take 10 mEq by mouth daily.     levothyroxine (SYNTHROID) 175 MCG tablet Take 175 mcg by mouth daily before breakfast.     lisinopril (ZESTRIL) 5 MG tablet Take 5 mg by mouth daily.     metFORMIN (GLUCOPHAGE) 500 MG tablet Take 1 tablet (500 mg total) by mouth 3 (three) times daily. 90 tablet 0   nitrofurantoin, macrocrystal-monohydrate, (MACROBID) 100 MG capsule Take 100 mg by mouth 2 (two) times daily.     pantoprazole (PROTONIX) 20 MG tablet Take 20 mg by mouth daily.     rosuvastatin (CRESTOR) 40 MG tablet Take 40 mg by mouth daily.     spironolactone (ALDACTONE) 25 MG tablet Take 25 mg by mouth daily. Take 1/2 tab daily  SUBOXONE 8-2 MG FILM Dissolve 1 film under tongue twice daily  0   vitamin B-12 (CYANOCOBALAMIN) 500 MCG tablet Take 1 tablet (500 mcg total) by mouth daily. 90 tablet 12   Vitamin D, Ergocalciferol, (DRISDOL) 1.25 MG (50000 UNIT) CAPS capsule 1 po q wed and 1 po q sun 8 capsule 0   No current facility-administered medications on file prior to visit.    There are no Patient Instructions on file for this visit. No follow-ups on file.   Kris Hartmann, NP

## 2022-04-03 DIAGNOSIS — G8929 Other chronic pain: Secondary | ICD-10-CM | POA: Diagnosis not present

## 2022-04-03 DIAGNOSIS — Z79899 Other long term (current) drug therapy: Secondary | ICD-10-CM | POA: Diagnosis not present

## 2022-04-05 ENCOUNTER — Encounter (INDEPENDENT_AMBULATORY_CARE_PROVIDER_SITE_OTHER): Payer: Self-pay

## 2022-04-05 ENCOUNTER — Ambulatory Visit (INDEPENDENT_AMBULATORY_CARE_PROVIDER_SITE_OTHER): Payer: Medicare Other | Admitting: Nurse Practitioner

## 2022-04-05 VITALS — BP 102/66 | HR 60 | Resp 17 | Ht 64.0 in | Wt 206.6 lb

## 2022-04-05 DIAGNOSIS — I890 Lymphedema, not elsewhere classified: Secondary | ICD-10-CM

## 2022-04-05 NOTE — Progress Notes (Signed)
History of Present Illness  There is no documented history at this time  Assessments & Plan   There are no diagnoses linked to this encounter.    Additional instructions  Subjective:  Patient presents with venous ulcer of the Bilateral lower extremity.    Procedure:  3 layer unna wrap was placed Bilateral lower extremity.   Plan:   Follow up in one week.  

## 2022-04-06 DIAGNOSIS — I10 Essential (primary) hypertension: Secondary | ICD-10-CM | POA: Diagnosis not present

## 2022-04-06 DIAGNOSIS — I509 Heart failure, unspecified: Secondary | ICD-10-CM | POA: Diagnosis not present

## 2022-04-06 DIAGNOSIS — E785 Hyperlipidemia, unspecified: Secondary | ICD-10-CM | POA: Diagnosis not present

## 2022-04-06 DIAGNOSIS — R609 Edema, unspecified: Secondary | ICD-10-CM | POA: Diagnosis not present

## 2022-04-06 DIAGNOSIS — I340 Nonrheumatic mitral (valve) insufficiency: Secondary | ICD-10-CM | POA: Diagnosis not present

## 2022-04-09 NOTE — Progress Notes (Unsigned)
TeleHealth Visit:  This visit was completed with telemedicine (audio/video) technology. Alexis Christensen has verbally consented to this TeleHealth visit. The patient is located at home, the provider is located at home. The participants in this visit include the listed provider and patient. The visit was conducted today via MyChart video.  OBESITY Alexis Christensen is here to discuss her progress with her obesity treatment plan along with follow-up of her obesity related diagnoses.   Today's visit was # 15 Starting weight: 229 lbs Starting date: 03/28/2021 Weight at last in office visit: 204 lbs on 03/28/21 Total weight loss: 25 lbs at last in office visit on 03/28/21. Today's reported weight: *** lbs No weight reported.  Nutrition Plan: the Category 1 Plan.  Hunger is {EWCONTROLASSESSMENT:24261}. Cravings are {EWCONTROLASSESSMENT:24261}.  Current exercise: {exercise types:16438}  Interim History: ***  Assessment/Plan:  1. ***  2. ***  3. ***  Obesity: Current BMI *** Alexis Christensen {CHL AMB IS/IS NOT:210130109} currently in the action stage of change. As such, her goal is to {MWMwtloss#1:210800005}.  She has agreed to {MWMwtlossportion/plan2:23431}.   Exercise goals: {MWM EXERCISE RECS:23473}  Behavioral modification strategies: {MWMwtlossdietstrategies3:23432}.  Alexis Christensen has agreed to follow-up with our clinic in {NUMBER 1-10:22536} weeks.   No orders of the defined types were placed in this encounter.   There are no discontinued medications.   No orders of the defined types were placed in this encounter.     Objective:   VITALS: Per patient if applicable, see vitals. GENERAL: Alert and in no acute distress. CARDIOPULMONARY: No increased WOB. Speaking in clear sentences.  PSYCH: Pleasant and cooperative. Speech normal rate and rhythm. Affect is appropriate. Insight and judgement are appropriate. Attention is focused, linear, and appropriate.  NEURO: Oriented as arrived to appointment on time  with no prompting.   Lab Results  Component Value Date   CREATININE 0.69 02/25/2022   BUN 15 02/25/2022   NA 140 02/25/2022   K 3.4 (L) 02/25/2022   CL 101 02/25/2022   CO2 29 02/25/2022   Lab Results  Component Value Date   ALT 22 02/14/2022   AST 17 02/14/2022   ALKPHOS 80 02/14/2022   BILITOT 0.5 02/14/2022   Lab Results  Component Value Date   HGBA1C 5.7 06/20/2021   HGBA1C 6.0 03/06/2021   Lab Results  Component Value Date   INSULIN 13.8 02/14/2022   INSULIN 6.4 08/01/2021   INSULIN 7.4 03/28/2021   Lab Results  Component Value Date   TSH 8.66 (A) 06/20/2021   Lab Results  Component Value Date   CHOL 216 (A) 06/20/2021   HDL 61 06/20/2021   LDLCALC 141 06/20/2021   TRIG 80 06/20/2021   Lab Results  Component Value Date   WBC 6.2 02/25/2022   HGB 12.4 02/25/2022   HCT 38.8 02/25/2022   MCV 89.2 02/25/2022   PLT 339 02/25/2022   Lab Results  Component Value Date   IRON 39 07/13/2016   TIBC 408 07/13/2016   FERRITIN 11 07/13/2016   Lab Results  Component Value Date   VD25OH 19.6 (L) 02/14/2022   VD25OH 25.7 (L) 08/01/2021   VD25OH 25.0 (L) 03/28/2021    Attestation Statements:   Reviewed by clinician on day of visit: allergies, medications, problem list, medical history, surgical history, family history, social history, and previous encounter notes.  ***(delete if time-based billing not used) Time spent on visit including the items listed below was *** minutes.  -preparing to see the patient (e.g., review of tests, history, previous notes) -obtaining  and/or reviewing separately obtained history -counseling and educating the patient/family/caregiver -documenting clinical information in the electronic or other health record -ordering medications, tests, or procedures -independently interpreting results and communicating results to the patient/ family/caregiver -referring and communicating with other health care professionals  -care  coordination

## 2022-04-10 ENCOUNTER — Telehealth (INDEPENDENT_AMBULATORY_CARE_PROVIDER_SITE_OTHER): Payer: Medicare Other | Admitting: Family Medicine

## 2022-04-10 ENCOUNTER — Encounter (INDEPENDENT_AMBULATORY_CARE_PROVIDER_SITE_OTHER): Payer: Self-pay | Admitting: Family Medicine

## 2022-04-10 ENCOUNTER — Other Ambulatory Visit (INDEPENDENT_AMBULATORY_CARE_PROVIDER_SITE_OTHER): Payer: Self-pay | Admitting: Family Medicine

## 2022-04-10 DIAGNOSIS — I890 Lymphedema, not elsewhere classified: Secondary | ICD-10-CM

## 2022-04-10 DIAGNOSIS — Z6835 Body mass index (BMI) 35.0-35.9, adult: Secondary | ICD-10-CM

## 2022-04-10 DIAGNOSIS — E669 Obesity, unspecified: Secondary | ICD-10-CM | POA: Diagnosis not present

## 2022-04-10 DIAGNOSIS — E559 Vitamin D deficiency, unspecified: Secondary | ICD-10-CM

## 2022-04-10 DIAGNOSIS — R7303 Prediabetes: Secondary | ICD-10-CM

## 2022-04-10 MED ORDER — VITAMIN D (ERGOCALCIFEROL) 1.25 MG (50000 UNIT) PO CAPS: ORAL_CAPSULE | ORAL | 0 refills | Status: DC

## 2022-04-10 MED ORDER — METFORMIN HCL 500 MG PO TABS: 500.0000 mg | ORAL_TABLET | Freq: Three times a day (TID) | ORAL | 0 refills | Status: DC

## 2022-04-11 DIAGNOSIS — E785 Hyperlipidemia, unspecified: Secondary | ICD-10-CM | POA: Diagnosis not present

## 2022-04-11 DIAGNOSIS — J309 Allergic rhinitis, unspecified: Secondary | ICD-10-CM | POA: Diagnosis not present

## 2022-04-11 DIAGNOSIS — J329 Chronic sinusitis, unspecified: Secondary | ICD-10-CM | POA: Diagnosis not present

## 2022-04-12 ENCOUNTER — Encounter (INDEPENDENT_AMBULATORY_CARE_PROVIDER_SITE_OTHER): Payer: Self-pay

## 2022-04-12 ENCOUNTER — Ambulatory Visit (INDEPENDENT_AMBULATORY_CARE_PROVIDER_SITE_OTHER): Payer: Medicare Other | Admitting: Vascular Surgery

## 2022-04-12 VITALS — BP 109/72 | HR 61 | Resp 17 | Ht 64.0 in | Wt 207.8 lb

## 2022-04-12 DIAGNOSIS — I872 Venous insufficiency (chronic) (peripheral): Secondary | ICD-10-CM | POA: Diagnosis not present

## 2022-04-12 DIAGNOSIS — I83009 Varicose veins of unspecified lower extremity with ulcer of unspecified site: Secondary | ICD-10-CM

## 2022-04-12 DIAGNOSIS — L97909 Non-pressure chronic ulcer of unspecified part of unspecified lower leg with unspecified severity: Secondary | ICD-10-CM | POA: Insufficient documentation

## 2022-04-12 NOTE — Progress Notes (Signed)
History of Present Illness  There is no documented history at this time  Assessments & Plan   There are no diagnoses linked to this encounter.    Additional instructions  Subjective:  Patient presents with venous ulcer of the Bilateral lower extremity.    Procedure:  3 layer unna wrap was placed Bilateral lower extremity.   Plan:   Follow up in one week.  

## 2022-04-14 DIAGNOSIS — I10 Essential (primary) hypertension: Secondary | ICD-10-CM | POA: Diagnosis not present

## 2022-04-14 DIAGNOSIS — E785 Hyperlipidemia, unspecified: Secondary | ICD-10-CM | POA: Diagnosis not present

## 2022-04-17 ENCOUNTER — Encounter (INDEPENDENT_AMBULATORY_CARE_PROVIDER_SITE_OTHER): Payer: Self-pay

## 2022-04-17 DIAGNOSIS — Z79899 Other long term (current) drug therapy: Secondary | ICD-10-CM | POA: Diagnosis not present

## 2022-04-17 DIAGNOSIS — G8929 Other chronic pain: Secondary | ICD-10-CM | POA: Diagnosis not present

## 2022-04-17 NOTE — Telephone Encounter (Signed)
She certainly can wait until Thursday, and less the swelling is too painful for her to wait.  The traveling to [place] to definitely did cause worsening swelling.  Whenever you are writing for an extended timeframe that is always a risk factor for extended swelling.  The patient is advised to elevate her lower extremities to help with the swelling.

## 2022-04-17 NOTE — Telephone Encounter (Signed)
Please advise if she can wait to get unnaboots changed on Thursday.  She states her legs are swollen behind her knees as shown in the picture but there is no pain at all.  Pt traveled to [facility] over the weekend and thinks all that riding may have caused this

## 2022-04-19 ENCOUNTER — Ambulatory Visit (INDEPENDENT_AMBULATORY_CARE_PROVIDER_SITE_OTHER): Payer: Medicare Other | Admitting: Nurse Practitioner

## 2022-04-19 ENCOUNTER — Encounter (INDEPENDENT_AMBULATORY_CARE_PROVIDER_SITE_OTHER): Payer: Self-pay | Admitting: Nurse Practitioner

## 2022-04-19 VITALS — BP 117/76 | HR 61 | Resp 16 | Wt 211.6 lb

## 2022-04-19 DIAGNOSIS — I83009 Varicose veins of unspecified lower extremity with ulcer of unspecified site: Secondary | ICD-10-CM | POA: Diagnosis not present

## 2022-04-19 DIAGNOSIS — L97909 Non-pressure chronic ulcer of unspecified part of unspecified lower leg with unspecified severity: Secondary | ICD-10-CM

## 2022-04-19 NOTE — Progress Notes (Signed)
History of Present Illness  There is no documented history at this time  Assessments & Plan   There are no diagnoses linked to this encounter.    Additional instructions  Subjective:  Patient presents with venous ulcer of the Bilateral lower extremity.    Procedure:  3 layer unna wrap was placed Bilateral lower extremity.   Plan:   Follow up in one week.  

## 2022-04-20 ENCOUNTER — Encounter (INDEPENDENT_AMBULATORY_CARE_PROVIDER_SITE_OTHER): Payer: Self-pay | Admitting: Nurse Practitioner

## 2022-04-25 ENCOUNTER — Other Ambulatory Visit (INDEPENDENT_AMBULATORY_CARE_PROVIDER_SITE_OTHER): Payer: Self-pay | Admitting: Nurse Practitioner

## 2022-04-25 DIAGNOSIS — M79606 Pain in leg, unspecified: Secondary | ICD-10-CM

## 2022-04-26 ENCOUNTER — Ambulatory Visit (INDEPENDENT_AMBULATORY_CARE_PROVIDER_SITE_OTHER): Payer: Medicare Other | Admitting: Nurse Practitioner

## 2022-04-26 VITALS — BP 103/68 | HR 63 | Resp 17 | Ht 64.0 in | Wt 208.0 lb

## 2022-04-26 DIAGNOSIS — I83009 Varicose veins of unspecified lower extremity with ulcer of unspecified site: Secondary | ICD-10-CM | POA: Diagnosis not present

## 2022-04-26 DIAGNOSIS — L97909 Non-pressure chronic ulcer of unspecified part of unspecified lower leg with unspecified severity: Secondary | ICD-10-CM

## 2022-04-26 NOTE — Progress Notes (Signed)
History of Present Illness  There is no documented history at this time  Assessments & Plan   There are no diagnoses linked to this encounter.    Additional instructions  Subjective:  Patient presents with venous ulcer of the Bilateral lower extremity.    Procedure:  3 layer unna wrap was placed Bilateral lower extremity.   Plan:   Follow up in one week.  

## 2022-04-28 ENCOUNTER — Encounter (INDEPENDENT_AMBULATORY_CARE_PROVIDER_SITE_OTHER): Payer: Self-pay | Admitting: Nurse Practitioner

## 2022-04-30 DIAGNOSIS — E785 Hyperlipidemia, unspecified: Secondary | ICD-10-CM | POA: Diagnosis not present

## 2022-04-30 DIAGNOSIS — R109 Unspecified abdominal pain: Secondary | ICD-10-CM | POA: Diagnosis not present

## 2022-05-01 ENCOUNTER — Ambulatory Visit (INDEPENDENT_AMBULATORY_CARE_PROVIDER_SITE_OTHER): Payer: Medicare Other

## 2022-05-01 ENCOUNTER — Encounter (INDEPENDENT_AMBULATORY_CARE_PROVIDER_SITE_OTHER): Payer: Self-pay | Admitting: Nurse Practitioner

## 2022-05-01 ENCOUNTER — Ambulatory Visit (INDEPENDENT_AMBULATORY_CARE_PROVIDER_SITE_OTHER): Payer: Medicare Other | Admitting: Nurse Practitioner

## 2022-05-01 VITALS — BP 108/68 | HR 58 | Resp 18 | Wt 208.8 lb

## 2022-05-01 DIAGNOSIS — M79605 Pain in left leg: Secondary | ICD-10-CM | POA: Diagnosis not present

## 2022-05-01 DIAGNOSIS — I83009 Varicose veins of unspecified lower extremity with ulcer of unspecified site: Secondary | ICD-10-CM

## 2022-05-01 DIAGNOSIS — I890 Lymphedema, not elsewhere classified: Secondary | ICD-10-CM | POA: Diagnosis not present

## 2022-05-01 DIAGNOSIS — I5030 Unspecified diastolic (congestive) heart failure: Secondary | ICD-10-CM | POA: Diagnosis not present

## 2022-05-01 DIAGNOSIS — M79606 Pain in leg, unspecified: Secondary | ICD-10-CM | POA: Diagnosis not present

## 2022-05-01 DIAGNOSIS — L97909 Non-pressure chronic ulcer of unspecified part of unspecified lower leg with unspecified severity: Secondary | ICD-10-CM

## 2022-05-01 DIAGNOSIS — M79604 Pain in right leg: Secondary | ICD-10-CM | POA: Diagnosis not present

## 2022-05-02 ENCOUNTER — Other Ambulatory Visit: Payer: Self-pay | Admitting: Nurse Practitioner

## 2022-05-02 DIAGNOSIS — R1032 Left lower quadrant pain: Secondary | ICD-10-CM

## 2022-05-06 ENCOUNTER — Encounter (INDEPENDENT_AMBULATORY_CARE_PROVIDER_SITE_OTHER): Payer: Self-pay | Admitting: Nurse Practitioner

## 2022-05-06 NOTE — Progress Notes (Signed)
Subjective:    Patient ID: Alexis Christensen, female    DOB: 19-Feb-1971, 52 y.o.   MRN: [id number] No chief complaint on file.    The patient is a 51 year old female previously seen in 2020 for lymphedema.  The patient notes that for the last few weeks that she has been having severe pain in her lower extremities.  She notes that she has swelling and becomes very tender to touch and very difficult to put on shoes or walk.  The patient does have known lymphedema.  She notes that initially she was very diligent with the use of compression socks but she started having issues with swelling.  Due to this the patient does not wear compression.  She also has a lymphedema pump but she notes that she does this for an hour twice a day.  Currently there are no open wounds or ulcerations.  She denies any fevers or chills.  The patient was previously placed in [place] boots.  This is helped her swelling drastically but she still continues to have some discomfort.   Previous noninvasive study showed no evidence of DVT or superficial thrombophlebitis bilaterally.  There is evidence of deep venous insufficiency in right lower extremity.  Today noninvasive studies show a right ABI of 1.23 and a left 1.41.  The patient has triphasic tibial artery waveforms bilaterally with normal toe waveforms bilaterally.      Review of Systems  Cardiovascular:  Positive for leg swelling.  All other systems reviewed and are negative.      Objective:   Physical Exam Vitals reviewed.  HENT:     Head: Normocephalic.  Cardiovascular:     Rate and Rhythm: Normal rate.     Pulses:          Dorsalis pedis pulses are detected w/ Doppler on the right side and detected w/ Doppler on the left side.       Posterior tibial pulses are detected w/ Doppler on the right side and detected w/ Doppler on the left side.  Pulmonary:     Effort: Pulmonary effort is normal.  Skin:    General: Skin is warm and dry.  Neurological:     Mental  Status: She is alert and oriented to person, place, and time.  Psychiatric:        Mood and Affect: Mood normal.        Behavior: Behavior normal.        Thought Content: Thought content normal.        Judgment: Judgment normal.     BP 108/68 (BP Location: Right Arm)   Pulse (!) 58   Resp 18   Wt 208 lb 12.8 oz (94.7 kg)   BMI 35.84 kg/m   Past Medical History:  Diagnosis Date   Acute diastolic (congestive) heart failure (HCC)    ADHD    Adopted    Anemia    Anxiety    Arthritis    Asthma    B12 deficiency    Bilateral swelling of feet and ankles    Bipolar 1 disorder (HCC)    Bipolar disorder (HCC)    Carpal tunnel syndrome 09/15/2014   Chest pain    CHF (congestive heart failure) ([place])    Dialostic CHF   Chronic diarrhea 09/07/2015   Chronic kidney disease    H/O KIDNEY STONES   Chronic venous insufficiency 04/26/2016   Depression    DVT (deep venous thrombosis) ([place]) 2016   RIGHT LEG  Edema leg    Effusion of knee 12/30/2013   Family history of adverse reaction to anesthesia    ADOPTED   Food allergy    GERD (gastroesophageal reflux disease)    Goiter    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   Headache(784.0)    MIGRAINES   Heart burn    Heart murmur    History of blood clots    History of kidney stones    HLD (hyperlipidemia)    Hx MRSA infection    Hyperlipidemia    Hypothyroidism    Lipoma of arm 05/11/2013   Lower extremity edema    Lymphedema 04/26/2016   Other fatigue    Post traumatic stress disorder (PTSD)    Prediabetes    PVD (peripheral vascular disease) (HCC)    Seasonal allergies    Shortness of breath    Shortness of breath on exertion    Stress incontinence    Thyroid disease    Urge incontinence    Vitamin D deficiency     Social History   Socioeconomic History   Marital status: Divorced    Spouse name: Not on file   Number of children: Not on file   Years of education: Not on file   Highest education level: Not on  file  Occupational History   Occupation: Customer Service    Employer: [employer]  Tobacco Use   Smoking status: Former    Packs/day: 0.50    Years: 10.00    Total pack years: 5.00    Types: Cigarettes    Start date: 07/16/2008    Quit date: 07/16/2019    Years since quitting: 2.8   Smokeless tobacco: Former   Tobacco comments:    STRESS RELATED  Vaping Use   Vaping Use: Never used  Substance and Sexual Activity   Alcohol use: Not Currently    Comment: Hisotry of ETOH abouse ASB patient   Drug use: Yes    Types: Other-see comments    Comment: Pt listed history of abuse of "prescription drugs"   Sexual activity: Not Currently  Other Topics Concern   Not on file  Social History Narrative   Not on file   Social Determinants of Health   Financial Resource Strain: Not on file  Food Insecurity: Not on file  Transportation Needs: Not on file  Physical Activity: Not on file  Stress: Not on file  Social Connections: Not on file  Intimate Partner Violence: Not on file    Past Surgical History:  Procedure Laterality Date   ABDOMINAL HYSTERECTOMY     ANKLE SURGERY     CHOLECYSTECTOMY     COLONOSCOPY WITH PROPOFOL N/A 06/24/2019   Procedure: COLONOSCOPY WITH PROPOFOL;  Surgeon: Toledo, Benay Pike, MD;  Location: [facility] ENDOSCOPY;  Service: Gastroenterology;  Laterality: N/A;   CYSTOSCOPY N/A 04/09/2016   Procedure: CYSTOSCOPY;  Surgeon: Bjorn Loser, MD;  Location: [facility] ORS;  Service: Urology;  Laterality: N/A;   DILATION AND CURETTAGE OF UTERUS     ESOPHAGOGASTRODUODENOSCOPY (EGD) WITH PROPOFOL N/A 06/24/2019   Procedure: ESOPHAGOGASTRODUODENOSCOPY (EGD) WITH PROPOFOL;  Surgeon: Toledo, Benay Pike, MD;  Location: [facility] ENDOSCOPY;  Service: Gastroenterology;  Laterality: N/A;   INGUINAL HERNIA REPAIR Bilateral 04/18/2017   Procedure: LAPAROSCOPIC BILATERAL INGUINAL HERNIA REPAIR;  Surgeon: Jules Husbands, MD;  Location: [facility] ORS;  Service: General;  Laterality: Bilateral;    INTERSTIM IMPLANT PLACEMENT N/A 11/26/2016   Procedure: Barrie Lyme IMPLANT FIRST STAGE;  Surgeon: Bjorn Loser, MD;  Location: [facility]  ORS;  Service: Urology;  Laterality: N/A;   INTERSTIM IMPLANT PLACEMENT N/A 11/26/2016   Procedure: Barrie Lyme IMPLANT SECOND STAGE;  Surgeon: Bjorn Loser, MD;  Location: [facility] ORS;  Service: Urology;  Laterality: N/A;   JOINT REPLACEMENT Right    knee   KNEE ARTHROSCOPY Bilateral    KNEE ARTHROSCOPY WITH LATERAL RELEASE Left 10/18/2015   Procedure: KNEE ARTHROSCOPY LATERAL AND PARTIAL SYNOVECTOMY;  Surgeon: Hessie Knows, MD;  Location: [facility] ORS;  Service: Orthopedics;  Laterality: Left;   Lymph Node removal  2015   Neck   PUBOVAGINAL SLING N/A 04/09/2016   Procedure: PUBO-VAGINAL SLING/ RETROPUBIC SLING;  Surgeon: Bjorn Loser, MD;  Location: [facility] ORS;  Service: Urology;  Laterality: N/A;   REPLACEMENT TOTAL KNEE Right    SHOULDER SURGERY Right 2014   TUBAL LIGATION     WRIST SURGERY Right    metal plate    Family History  Adopted: Yes  Family history unknown: Yes    Allergies  Allergen Reactions   Geodon [Ziprasidone Hydrochloride] Other (See Comments)    Numbness ,sob, headaches, blurred vision   Sulfacetamide Sodium Hives   Ziprasidone Anaphylaxis    Other reaction(s): Other (See Comments) Numbness ,sob, headaches, blurred vision  Other reaction(s): Other (See Comments), Unknown Numbness ,sob, headaches, blurred vision Numbness ,sob, headaches, blurred vision  Other reaction(s): Other (See Comments), Unknown Numbness ,sob, headaches, blurred vision Numbness ,sob, headaches, blurred vision   Numbness ,sob, headaches, blurred vision Other reaction(s): Other (See Comments), Unknown Numbness ,sob, headaches, blurred vision Numbness ,sob, headaches, blurred vision Other reaction(s): Other (See Comments), Unknown Numbness ,sob, headaches, blurred vision Numbness ,sob, headaches, blurred vision   Ziprasidone Hcl Anaphylaxis and Other  (See Comments)    Other reaction(s): Other (See Comments), Unknown Numbness ,sob, headaches, blurred vision Numbness ,sob, headaches, blurred vision   Ace Inhibitors Rash   Erythromycin Rash and Hives   Hydromorphone Rash   Lamictal [Lamotrigine] Rash    Other reaction(s): Unknown   Strawberry (Diagnostic) Rash   Sulfa Antibiotics Hives and Rash    Other reaction(s): Unknown Other reaction(s): Unknown Other reaction(s): Unknown       Latest Ref Rng & Units 02/25/2022    2:35 PM 07/17/2021    9:10 AM 06/20/2021   12:00 AM  CBC  WBC 4.0 - 10.5 K/uL 6.2  5.6  6.6   Hemoglobin 12.0 - 15.0 g/dL 12.4  13.5  11.6   Hematocrit 36.0 - 46.0 % 38.8  41.3  34   Platelets 150 - 400 K/uL 339  265        CMP     Component Value Date/Time   NA 140 02/25/2022 1435   NA 139 02/14/2022 1234   NA 138 11/07/2012 0517   K 3.4 (L) 02/25/2022 1435   K 3.9 11/07/2012 0517   CL 101 02/25/2022 1435   CL 109 (H) 11/07/2012 0517   CO2 29 02/25/2022 1435   CO2 25 11/07/2012 0517   GLUCOSE 91 02/25/2022 1435   GLUCOSE 91 11/07/2012 0517   BUN 15 02/25/2022 1435   BUN 17 02/14/2022 1234   BUN 7 11/07/2012 0517   CREATININE 0.69 02/25/2022 1435   CREATININE 0.76 11/07/2012 0517   CALCIUM 8.9 02/25/2022 1435   CALCIUM 7.9 (L) 11/07/2012 0517   PROT 6.3 02/14/2022 1234   ALBUMIN 3.9 02/14/2022 1234   AST 17 02/14/2022 1234   ALT 22 02/14/2022 1234   ALKPHOS 80 02/14/2022 1234   BILITOT 0.5 02/14/2022 1234  GFRNONAA >60 02/25/2022 1435   GFRNONAA >60 11/07/2012 0517   GFRAA >60 02/17/2020 0000   GFRAA >60 11/07/2012 0517     VAS [name] ABI WITH/WO TBI  Result Date: 05/01/2022  LOWER EXTREMITY DOPPLER STUDY Patient Name:  DIMA MINI  Date of Exam:   05/01/2022 Medical Rec #: [id number]      Accession #:    [id number] Date of Birth: 1970-10-25      Patient Gender: F Patient Age:   50 years Exam Location:  [facility] Procedure:      VAS [name] ABI WITH/WO TBI Referring Phys: Eulogio Ditch --------------------------------------------------------------------------------  Indications: Claudication. High Risk Factors: Past history of smoking.  Performing Technologist: Delorise Shiner RVT  Examination Guidelines: A complete evaluation includes at minimum, Doppler waveform signals and systolic blood pressure reading at the level of bilateral brachial, anterior tibial, and posterior tibial arteries, when vessel segments are accessible. Bilateral testing is considered an integral part of a complete examination. Photoelectric Plethysmograph (PPG) waveforms and toe systolic pressure readings are included as required and additional duplex testing as needed. Limited examinations for reoccurring indications may be performed as noted.  ABI Findings: +---------+------------------+-----+---------+--------+ Right    Rt Pressure (mmHg)IndexWaveform Comment  +---------+------------------+-----+---------+--------+ Brachial 102                                      +---------+------------------+-----+---------+--------+ ATA      118               1.13 triphasic         +---------+------------------+-----+---------+--------+ PTA      128               1.23 triphasic         +---------+------------------+-----+---------+--------+ Great Toe106               1.02                   +---------+------------------+-----+---------+--------+ +---------+------------------+-----+---------+-------+ Left     Lt Pressure (mmHg)IndexWaveform Comment +---------+------------------+-----+---------+-------+ Brachial 104                                     +---------+------------------+-----+---------+-------+ ATA      125               1.20 triphasic        +---------+------------------+-----+---------+-------+ PTA      147               1.41 triphasic        +---------+------------------+-----+---------+-------+ Great Toe101               0.97                   +---------+------------------+-----+---------+-------+ +-------+-----------+-----------+------------+------------+ ABI/TBIToday's ABIToday's TBIPrevious ABIPrevious TBI +-------+-----------+-----------+------------+------------+ Right  1.23       1.02                                +-------+-----------+-----------+------------+------------+ Left   1.41       0.97                                +-------+-----------+-----------+------------+------------+ Arterial wall calcification precludes accurate ankle  pressures and ABIs.  Summary: Right: Resting right ankle-brachial index is within normal range. The right toe-brachial index is normal. Left: Resting left ankle-brachial index indicates noncompressible left lower extremity arteries. The left toe-brachial index is normal. *See table(s) above for measurements and observations.  Electronically signed by Leotis Pain MD on 05/01/2022 at 4:16:23 PM.    Final        Assessment & Plan:   1. Venous ulcer ([place]) Currently resolved  2. Pain in both lower extremities Recommend:  The patient has atypical pain symptoms for vascular disease and on exam I do not find evidence of vascular pathology that would explain the patient's symptoms.  Noninvasive studies do not identify significant vascular problems  I suspect the patient is c/o possible pseudoclaudication or possible neuropathy.  I defer further work-up to the primary service.  3. Lymphedema Patient is advised to continue with the use of her lymphedema pump.  4. Diastolic congestive heart failure, unspecified HF chronicity (HCC) Complicate the patient's lower extremity edema   Current Outpatient Medications on File Prior to Visit  Medication Sig Dispense Refill   amitriptyline (ELAVIL) 75 MG tablet Take 1 tablet by mouth at bedtime.     ARIPiprazole (ABILIFY) 20 MG tablet Take 20 mg by mouth at bedtime as needed.     aspirin 81 MG chewable tablet Aspirin 81 MG Oral Tablet Chewable  QTY: 30 tablet Days: 30 Refills: 0  Written: 01/23/21 Patient Instructions: once a day     colestipol (COLESTID) 1 g tablet Take 2 g by mouth 2 (two) times daily.     dicyclomine (BENTYL) 10 MG capsule Take 10 mg by mouth every 8 (eight) hours as needed.     DULoxetine (CYMBALTA) 60 MG capsule Take 60 mg by mouth 2 (two) times daily.     furosemide (LASIX) 20 MG tablet Take 40 mg by mouth daily.     gabapentin (NEURONTIN) 600 MG tablet Take 600 mg by mouth 2 (two) times daily.      hydrOXYzine (ATARAX/VISTARIL) 25 MG tablet Take 50 mg by mouth every 8 (eight) hours as needed for itching.     KLOR-CON M10 10 MEQ tablet Take 10 mEq by mouth daily.     levothyroxine (SYNTHROID) 175 MCG tablet Take 175 mcg by mouth daily before breakfast.     lisinopril (ZESTRIL) 5 MG tablet Take 5 mg by mouth daily.     metFORMIN (GLUCOPHAGE) 500 MG tablet Take 1 tablet (500 mg total) by mouth 3 (three) times daily. 90 tablet 0   nitrofurantoin, macrocrystal-monohydrate, (MACROBID) 100 MG capsule Take 100 mg by mouth 2 (two) times daily.     pantoprazole (PROTONIX) 20 MG tablet Take 20 mg by mouth daily.     rosuvastatin (CRESTOR) 40 MG tablet Take 40 mg by mouth daily.     spironolactone (ALDACTONE) 25 MG tablet Take 25 mg by mouth daily. Take 1/2 tab daily     SUBOXONE 8-2 MG FILM Dissolve 1 film under tongue twice daily  0   vitamin B-12 (CYANOCOBALAMIN) 500 MCG tablet Take 1 tablet (500 mcg total) by mouth daily. 90 tablet 12   Vitamin D, Ergocalciferol, (DRISDOL) 1.25 MG (50000 UNIT) CAPS capsule 1 po q wed and 1 po q sun 8 capsule 0   No current facility-administered medications on file prior to visit.    There are no Patient Instructions on file for this visit. No follow-ups on file.   Kris Hartmann, NP

## 2022-05-08 ENCOUNTER — Encounter (INDEPENDENT_AMBULATORY_CARE_PROVIDER_SITE_OTHER): Payer: Self-pay | Admitting: Family Medicine

## 2022-05-08 ENCOUNTER — Ambulatory Visit (INDEPENDENT_AMBULATORY_CARE_PROVIDER_SITE_OTHER): Payer: Medicare Other | Admitting: Family Medicine

## 2022-05-08 ENCOUNTER — Other Ambulatory Visit (INDEPENDENT_AMBULATORY_CARE_PROVIDER_SITE_OTHER): Payer: Self-pay | Admitting: Family Medicine

## 2022-05-08 VITALS — BP 99/64 | HR 52 | Temp 98.0°F | Ht 64.0 in | Wt 206.0 lb

## 2022-05-08 DIAGNOSIS — E669 Obesity, unspecified: Secondary | ICD-10-CM

## 2022-05-08 DIAGNOSIS — E559 Vitamin D deficiency, unspecified: Secondary | ICD-10-CM | POA: Diagnosis not present

## 2022-05-08 DIAGNOSIS — R0602 Shortness of breath: Secondary | ICD-10-CM

## 2022-05-08 DIAGNOSIS — R7303 Prediabetes: Secondary | ICD-10-CM | POA: Diagnosis not present

## 2022-05-08 DIAGNOSIS — Z6835 Body mass index (BMI) 35.0-35.9, adult: Secondary | ICD-10-CM

## 2022-05-08 MED ORDER — METFORMIN HCL 500 MG PO TABS: 500.0000 mg | ORAL_TABLET | Freq: Three times a day (TID) | ORAL | 0 refills | Status: DC

## 2022-05-08 MED ORDER — VITAMIN D (ERGOCALCIFEROL) 1.25 MG (50000 UNIT) PO CAPS: ORAL_CAPSULE | ORAL | 0 refills | Status: DC

## 2022-05-09 ENCOUNTER — Ambulatory Visit
Admission: RE | Admit: 2022-05-09 | Discharge: 2022-05-09 | Disposition: A | Discharge status: Home / Self Care | Payer: Medicare Other | Source: Ambulatory Visit | Attending: Nurse Practitioner | Admitting: Nurse Practitioner

## 2022-05-09 DIAGNOSIS — R1032 Left lower quadrant pain: Secondary | ICD-10-CM | POA: Insufficient documentation

## 2022-05-09 DIAGNOSIS — Z780 Asymptomatic menopausal state: Secondary | ICD-10-CM | POA: Diagnosis not present

## 2022-05-10 DIAGNOSIS — E063 Autoimmune thyroiditis: Secondary | ICD-10-CM | POA: Diagnosis not present

## 2022-05-14 ENCOUNTER — Encounter (INDEPENDENT_AMBULATORY_CARE_PROVIDER_SITE_OTHER): Payer: Self-pay

## 2022-05-14 DIAGNOSIS — R5383 Other fatigue: Secondary | ICD-10-CM | POA: Diagnosis not present

## 2022-05-14 DIAGNOSIS — E039 Hypothyroidism, unspecified: Secondary | ICD-10-CM | POA: Diagnosis not present

## 2022-05-14 DIAGNOSIS — G629 Polyneuropathy, unspecified: Secondary | ICD-10-CM | POA: Diagnosis not present

## 2022-05-14 DIAGNOSIS — M79606 Pain in leg, unspecified: Secondary | ICD-10-CM | POA: Diagnosis not present

## 2022-05-14 DIAGNOSIS — I890 Lymphedema, not elsewhere classified: Secondary | ICD-10-CM | POA: Diagnosis not present

## 2022-05-15 DIAGNOSIS — G8929 Other chronic pain: Secondary | ICD-10-CM | POA: Diagnosis not present

## 2022-05-17 DIAGNOSIS — I509 Heart failure, unspecified: Secondary | ICD-10-CM | POA: Diagnosis not present

## 2022-05-17 DIAGNOSIS — R5383 Other fatigue: Secondary | ICD-10-CM | POA: Diagnosis not present

## 2022-05-17 DIAGNOSIS — I10 Essential (primary) hypertension: Secondary | ICD-10-CM | POA: Diagnosis not present

## 2022-05-17 DIAGNOSIS — E785 Hyperlipidemia, unspecified: Secondary | ICD-10-CM | POA: Diagnosis not present

## 2022-05-17 DIAGNOSIS — I340 Nonrheumatic mitral (valve) insufficiency: Secondary | ICD-10-CM | POA: Diagnosis not present

## 2022-05-18 DIAGNOSIS — E785 Hyperlipidemia, unspecified: Secondary | ICD-10-CM | POA: Diagnosis not present

## 2022-05-18 DIAGNOSIS — E039 Hypothyroidism, unspecified: Secondary | ICD-10-CM | POA: Diagnosis not present

## 2022-05-18 DIAGNOSIS — J069 Acute upper respiratory infection, unspecified: Secondary | ICD-10-CM | POA: Diagnosis not present

## 2022-05-18 DIAGNOSIS — Z885 Allergy status to narcotic agent status: Secondary | ICD-10-CM | POA: Diagnosis not present

## 2022-05-18 DIAGNOSIS — J45909 Unspecified asthma, uncomplicated: Secondary | ICD-10-CM | POA: Diagnosis not present

## 2022-05-18 DIAGNOSIS — Z79899 Other long term (current) drug therapy: Secondary | ICD-10-CM | POA: Diagnosis not present

## 2022-05-18 DIAGNOSIS — Z20822 Contact with and (suspected) exposure to covid-19: Secondary | ICD-10-CM | POA: Diagnosis not present

## 2022-05-18 DIAGNOSIS — I5032 Chronic diastolic (congestive) heart failure: Secondary | ICD-10-CM | POA: Diagnosis not present

## 2022-05-18 DIAGNOSIS — B338 Other specified viral diseases: Secondary | ICD-10-CM | POA: Diagnosis not present

## 2022-05-18 DIAGNOSIS — Z87891 Personal history of nicotine dependence: Secondary | ICD-10-CM | POA: Diagnosis not present

## 2022-05-18 DIAGNOSIS — Z881 Allergy status to other antibiotic agents status: Secondary | ICD-10-CM | POA: Diagnosis not present

## 2022-05-18 DIAGNOSIS — Z880 Allergy status to penicillin: Secondary | ICD-10-CM | POA: Diagnosis not present

## 2022-05-18 DIAGNOSIS — E079 Disorder of thyroid, unspecified: Secondary | ICD-10-CM | POA: Diagnosis not present

## 2022-05-21 NOTE — Progress Notes (Unsigned)
Chief Complaint:   OBESITY Alexis Christensen is here to discuss her progress with her obesity treatment plan along with follow-up of her obesity related diagnoses. Alexis Christensen is on the Category 1 Plan + breakfast and lunch options and states she is following her eating plan approximately 85% of the time. Alexis Christensen states she is walking 30 minutes 3 times per week.  Today's visit was #: 14 Starting weight: 229 lbs Starting date: 03/28/2021 Today's weight: 206 lbs Today's date: 05/08/2022 Total lbs lost to date: 23 lbs Total lbs lost since last in-office visit: +2 lbs  Interim History: Father in law passed away unexpectedly.  Recently patient has been off her meal plan, more so than usual.  Due to gatherings, she did the best she could, but more off plan eating.  She has right leg pain, abdominal pain, she is getting an ultrasound tomorrow.  She is also complaining of eye floaters.  She is able to walk 3-4 days per week for about 30 minutes or so.   Subjective:   1. Pre-diabetes She has less cravings while using medications. Bowl movements WNL, no GI upsets.  Tolerating medications well.    2. Vitamin D deficiency Alexis Christensen is tolerating medication(s) well without side effects.  Medication compliance is good as patient endorses taking it as prescribed.  The patient denies additional concerns regarding this condition.      3. SOB (shortness of breath) on exertion Alexis Christensen notes increasing shortness of breath with exercising and seems to be worsening over time with weight gain. She notes getting out of breath sooner with activity than she used to. This has not gotten worse recently. Alexis Christensen denies shortness of breath at rest or orthopnea.   Assessment/Plan:  No orders of the defined types were placed in this encounter.   Medications Discontinued During This Encounter  Medication Reason   Vitamin D, Ergocalciferol, (DRISDOL) 1.25 MG (50000 UNIT) CAPS capsule Reorder   metFORMIN (GLUCOPHAGE) 500 MG tablet  Reorder     Meds ordered this encounter  Medications   metFORMIN (GLUCOPHAGE) 500 MG tablet    Sig: Take 1 tablet (500 mg total) by mouth 3 (three) times daily.    Dispense:  90 tablet    Refill:  0    30 d supply;  ** OV for RF **   Do not send RF request   Vitamin D, Ergocalciferol, (DRISDOL) 1.25 MG (50000 UNIT) CAPS capsule    Sig: 1 po q wed and 1 po q sun    Dispense:  8 capsule    Refill:  0    30 d supply;  ** OV for RF **   Do not send RF request     1. Pre-diabetes Refill - metFORMIN (GLUCOPHAGE) 500 MG tablet; Take 1 tablet (500 mg total) by mouth 3 (three) times daily.  Dispense: 90 tablet; Refill: 0  2. Vitamin D deficiency Refill - Vitamin D, Ergocalciferol, (DRISDOL) 1.25 MG (50000 UNIT) CAPS capsule; 1 po q wed and 1 po q sun  Dispense: 8 capsule; Refill: 0  3. SOB (shortness of breath) on exertion New IC, 09/22 IC/RMR=1570.  05/08/2022 RMR/IC, 1426 with her 10% weight loss, less than calculated at 1531.  We discussed ways to increase REE (rest, energy, expend).  Patient will start with resistance bands and doing 3 sets of 10 at least 2 days per week.   4. Obesity, Current BMI 35.5 Labs are not due yet, it has been less than 3  months.  She will come in fasting to next office visit.  Shivonne is currently in the action stage of change. As such, her goal is to continue with weight loss efforts. She has agreed to the Category 1 Plan + breakfast and lunch options.   Exercise goals:  add in strength training.   Behavioral modification strategies: increasing lean protein intake and decreasing simple carbohydrates.  Alexis Christensen has agreed to follow-up with our clinic in 4 weeks. She was informed of the importance of frequent follow-up visits to maximize her success with intensive lifestyle modifications for her multiple health conditions.   Objective:   Blood pressure 99/64, pulse (!) 52, temperature 98 F (36.7 C), height '5\' 4"'$  (1.626 m), weight 206 lb (93.4 kg), SpO2 99  %. Body mass index is 35.36 kg/m.  General: Cooperative, alert, well developed, in no acute distress. HEENT: Conjunctivae and lids unremarkable. Cardiovascular: Regular rhythm.  Lungs: Normal work of breathing. Neurologic: No focal deficits.   Lab Results  Component Value Date   CREATININE 0.69 02/25/2022   BUN 15 02/25/2022   NA 140 02/25/2022   K 3.4 (L) 02/25/2022   CL 101 02/25/2022   CO2 29 02/25/2022   Lab Results  Component Value Date   ALT 22 02/14/2022   AST 17 02/14/2022   ALKPHOS 80 02/14/2022   BILITOT 0.5 02/14/2022   Lab Results  Component Value Date   HGBA1C 5.7 06/20/2021   HGBA1C 6.0 03/06/2021   Lab Results  Component Value Date   INSULIN 13.8 02/14/2022   INSULIN 6.4 08/01/2021   INSULIN 7.4 03/28/2021   Lab Results  Component Value Date   TSH 8.66 (A) 06/20/2021   Lab Results  Component Value Date   CHOL 216 (A) 06/20/2021   HDL 61 06/20/2021   LDLCALC 141 06/20/2021   TRIG 80 06/20/2021   Lab Results  Component Value Date   VD25OH 19.6 (L) 02/14/2022   VD25OH 25.7 (L) 08/01/2021   VD25OH 25.0 (L) 03/28/2021   Lab Results  Component Value Date   WBC 6.2 02/25/2022   HGB 12.4 02/25/2022   HCT 38.8 02/25/2022   MCV 89.2 02/25/2022   PLT 339 02/25/2022   Lab Results  Component Value Date   IRON 39 07/13/2016   TIBC 408 07/13/2016   FERRITIN 11 07/13/2016   Obesity Behavioral Intervention:   Approximately 15 minutes were spent on the discussion below.  ASK: We discussed the diagnosis of obesity with Alexis Christensen today and Alexis Christensen agreed to give Alexis Christensen permission to discuss obesity behavioral modification therapy today.  ASSESS: Levina has the diagnosis of obesity and her BMI today is 35.5. Shannie is in the action stage of change.   ADVISE: Alexis Christensen was educated on the multiple health risks of obesity as well as the benefit of weight loss to improve her health. She was advised of the need for long term treatment and the importance of lifestyle  modifications to improve her current health and to decrease her risk of future health problems.  AGREE: Multiple dietary modification options and treatment options were discussed and Alexis Christensen agreed to follow the recommendations documented in the above note.  ARRANGE: Jaslynne was educated on the importance of frequent visits to treat obesity as outlined per CMS and USPSTF guidelines and agreed to schedule her next follow up appointment today.  Attestation Statements:   Reviewed by clinician on day of visit: allergies, medications, problem list, medical history, surgical history, family history, social history, and previous encounter notes.  I, Davy Pique, RMA, am acting as Location manager for [employer], DO.   I have reviewed the above documentation for accuracy and completeness, and I agree with the above. Marjory Sneddon, D.O.  The [place] was signed into law in 2016 which includes the topic of electronic health records.  This provides immediate access to information in MyChart.  This includes consultation notes, operative notes, office notes, lab results and pathology reports.  If you have any questions about what you read please let Alexis Christensen know at your next visit so we can discuss your concerns and take corrective action if need be.  We are right here with you.

## 2022-05-22 ENCOUNTER — Encounter: Payer: Self-pay | Admitting: Oncology

## 2022-05-22 DIAGNOSIS — E042 Nontoxic multinodular goiter: Secondary | ICD-10-CM | POA: Diagnosis not present

## 2022-05-22 DIAGNOSIS — E063 Autoimmune thyroiditis: Secondary | ICD-10-CM | POA: Diagnosis not present

## 2022-05-23 DIAGNOSIS — H43392 Other vitreous opacities, left eye: Secondary | ICD-10-CM | POA: Diagnosis not present

## 2022-05-27 DIAGNOSIS — Z87891 Personal history of nicotine dependence: Secondary | ICD-10-CM | POA: Diagnosis not present

## 2022-05-27 DIAGNOSIS — M79661 Pain in right lower leg: Secondary | ICD-10-CM | POA: Diagnosis not present

## 2022-05-27 DIAGNOSIS — M5410 Radiculopathy, site unspecified: Secondary | ICD-10-CM | POA: Diagnosis not present

## 2022-05-27 DIAGNOSIS — E785 Hyperlipidemia, unspecified: Secondary | ICD-10-CM | POA: Diagnosis not present

## 2022-05-27 DIAGNOSIS — M79604 Pain in right leg: Secondary | ICD-10-CM | POA: Diagnosis not present

## 2022-05-27 DIAGNOSIS — R202 Paresthesia of skin: Secondary | ICD-10-CM | POA: Diagnosis not present

## 2022-05-27 DIAGNOSIS — M25571 Pain in right ankle and joints of right foot: Secondary | ICD-10-CM | POA: Diagnosis not present

## 2022-05-29 DIAGNOSIS — Z79899 Other long term (current) drug therapy: Secondary | ICD-10-CM | POA: Diagnosis not present

## 2022-05-29 DIAGNOSIS — G8929 Other chronic pain: Secondary | ICD-10-CM | POA: Diagnosis not present

## 2022-05-30 DIAGNOSIS — E039 Hypothyroidism, unspecified: Secondary | ICD-10-CM | POA: Diagnosis not present

## 2022-05-30 DIAGNOSIS — N390 Urinary tract infection, site not specified: Secondary | ICD-10-CM | POA: Diagnosis not present

## 2022-05-30 DIAGNOSIS — E785 Hyperlipidemia, unspecified: Secondary | ICD-10-CM | POA: Diagnosis not present

## 2022-05-30 DIAGNOSIS — M25551 Pain in right hip: Secondary | ICD-10-CM | POA: Diagnosis not present

## 2022-06-04 ENCOUNTER — Encounter (INDEPENDENT_AMBULATORY_CARE_PROVIDER_SITE_OTHER): Payer: Self-pay | Admitting: Family Medicine

## 2022-06-04 DIAGNOSIS — M25551 Pain in right hip: Secondary | ICD-10-CM | POA: Diagnosis not present

## 2022-06-04 NOTE — Progress Notes (Unsigned)
TeleHealth Visit:  This visit was completed with telemedicine (audio/video) technology. Alexis Christensen has verbally consented to this TeleHealth visit. The patient is located at home, the provider is located at home. The participants in this visit include the listed provider and patient. The visit was conducted today via MyChart video.  OBESITY Alexis Christensen is here to discuss her progress with her obesity treatment plan along with follow-up of her obesity related diagnoses.   Today's visit was # 17 Starting weight: 229 lbs Starting date: 03/28/2021 Weight at last in office visit: 206 lbs on 05/08/22 Total weight loss: 23 lbs at last in office visit on 05/08/22. Today's reported weight: *** lbs No weight reported.  Nutrition Plan: 1 Plan + breakfast and lunch option.   Current exercise: {exercise types:16438} walking 30 minutes 3 times per week  Interim History: ***  Assessment/Plan:  1. ***  2. ***  3. ***  Obesity: Current BMI *** Alexis Christensen {CHL AMB IS/IS NOT:210130109} currently in the action stage of change. As such, her goal is to {MWMwtloss#1:210800005}.  She has agreed to {MWMwtlossportion/plan2:23431}.   Exercise goals: {MWM EXERCISE RECS:23473}  Behavioral modification strategies: {MWMwtlossdietstrategies3:23432}.  Alexis Christensen has agreed to follow-up with our clinic in {NUMBER 1-10:22536} weeks.   No orders of the defined types were placed in this encounter.   There are no discontinued medications.   No orders of the defined types were placed in this encounter.     Objective:   VITALS: Per patient if applicable, see vitals. GENERAL: Alert and in no acute distress. CARDIOPULMONARY: No increased WOB. Speaking in clear sentences.  PSYCH: Pleasant and cooperative. Speech normal rate and rhythm. Affect is appropriate. Insight and judgement are appropriate. Attention is focused, linear, and appropriate.  NEURO: Oriented as arrived to appointment on time with no prompting.   Lab  Results  Component Value Date   CREATININE 0.69 02/25/2022   BUN 15 02/25/2022   NA 140 02/25/2022   K 3.4 (L) 02/25/2022   CL 101 02/25/2022   CO2 29 02/25/2022   Lab Results  Component Value Date   ALT 22 02/14/2022   AST 17 02/14/2022   ALKPHOS 80 02/14/2022   BILITOT 0.5 02/14/2022   Lab Results  Component Value Date   HGBA1C 5.7 06/20/2021   HGBA1C 6.0 03/06/2021   Lab Results  Component Value Date   INSULIN 13.8 02/14/2022   INSULIN 6.4 08/01/2021   INSULIN 7.4 03/28/2021   Lab Results  Component Value Date   TSH 8.66 (A) 06/20/2021   Lab Results  Component Value Date   CHOL 216 (A) 06/20/2021   HDL 61 06/20/2021   LDLCALC 141 06/20/2021   TRIG 80 06/20/2021   Lab Results  Component Value Date   WBC 6.2 02/25/2022   HGB 12.4 02/25/2022   HCT 38.8 02/25/2022   MCV 89.2 02/25/2022   PLT 339 02/25/2022   Lab Results  Component Value Date   IRON 39 07/13/2016   TIBC 408 07/13/2016   FERRITIN 11 07/13/2016   Lab Results  Component Value Date   VD25OH 19.6 (L) 02/14/2022   VD25OH 25.7 (L) 08/01/2021   VD25OH 25.0 (L) 03/28/2021    Attestation Statements:   Reviewed by clinician on day of visit: allergies, medications, problem list, medical history, surgical history, family history, social history, and previous encounter notes.  ***(delete if time-based billing not used) Time spent on visit including the items listed below was *** minutes.  -preparing to see the patient (e.g., review of tests,  history, previous notes) -obtaining and/or reviewing separately obtained history -counseling and educating the patient/family/caregiver -documenting clinical information in the electronic or other health record -ordering medications, tests, or procedures -independently interpreting results and communicating results to the patient/ family/caregiver -referring and communicating with other health care professionals  -care coordination

## 2022-06-05 ENCOUNTER — Telehealth (INDEPENDENT_AMBULATORY_CARE_PROVIDER_SITE_OTHER): Payer: Medicare Other | Admitting: Family Medicine

## 2022-06-05 ENCOUNTER — Encounter (INDEPENDENT_AMBULATORY_CARE_PROVIDER_SITE_OTHER): Payer: Self-pay | Admitting: Family Medicine

## 2022-06-05 DIAGNOSIS — E669 Obesity, unspecified: Secondary | ICD-10-CM

## 2022-06-05 DIAGNOSIS — Z6835 Body mass index (BMI) 35.0-35.9, adult: Secondary | ICD-10-CM | POA: Diagnosis not present

## 2022-06-05 DIAGNOSIS — R7303 Prediabetes: Secondary | ICD-10-CM

## 2022-06-05 DIAGNOSIS — E559 Vitamin D deficiency, unspecified: Secondary | ICD-10-CM

## 2022-06-05 DIAGNOSIS — E785 Hyperlipidemia, unspecified: Secondary | ICD-10-CM | POA: Diagnosis not present

## 2022-06-05 DIAGNOSIS — I10 Essential (primary) hypertension: Secondary | ICD-10-CM | POA: Diagnosis not present

## 2022-06-05 DIAGNOSIS — I340 Nonrheumatic mitral (valve) insufficiency: Secondary | ICD-10-CM | POA: Diagnosis not present

## 2022-06-05 DIAGNOSIS — I509 Heart failure, unspecified: Secondary | ICD-10-CM | POA: Diagnosis not present

## 2022-06-05 DIAGNOSIS — R0602 Shortness of breath: Secondary | ICD-10-CM | POA: Diagnosis not present

## 2022-06-05 DIAGNOSIS — R079 Chest pain, unspecified: Secondary | ICD-10-CM | POA: Diagnosis not present

## 2022-06-05 MED ORDER — VITAMIN D (ERGOCALCIFEROL) 1.25 MG (50000 UNIT) PO CAPS: ORAL_CAPSULE | ORAL | 0 refills | Status: DC

## 2022-06-05 MED ORDER — METFORMIN HCL 500 MG PO TABS: 500.0000 mg | ORAL_TABLET | Freq: Three times a day (TID) | ORAL | 0 refills | Status: DC

## 2022-06-11 DIAGNOSIS — R5383 Other fatigue: Secondary | ICD-10-CM | POA: Diagnosis not present

## 2022-06-11 DIAGNOSIS — R072 Precordial pain: Secondary | ICD-10-CM | POA: Diagnosis not present

## 2022-06-11 DIAGNOSIS — E785 Hyperlipidemia, unspecified: Secondary | ICD-10-CM | POA: Diagnosis not present

## 2022-06-11 DIAGNOSIS — I10 Essential (primary) hypertension: Secondary | ICD-10-CM | POA: Diagnosis not present

## 2022-06-11 DIAGNOSIS — R7303 Prediabetes: Secondary | ICD-10-CM | POA: Diagnosis not present

## 2022-06-11 DIAGNOSIS — M25561 Pain in right knee: Secondary | ICD-10-CM | POA: Diagnosis not present

## 2022-06-11 DIAGNOSIS — N390 Urinary tract infection, site not specified: Secondary | ICD-10-CM | POA: Diagnosis not present

## 2022-06-11 DIAGNOSIS — E039 Hypothyroidism, unspecified: Secondary | ICD-10-CM | POA: Diagnosis not present

## 2022-06-11 DIAGNOSIS — M25551 Pain in right hip: Secondary | ICD-10-CM | POA: Diagnosis not present

## 2022-06-12 DIAGNOSIS — M25551 Pain in right hip: Secondary | ICD-10-CM | POA: Diagnosis not present

## 2022-06-12 DIAGNOSIS — I10 Essential (primary) hypertension: Secondary | ICD-10-CM | POA: Diagnosis not present

## 2022-06-12 DIAGNOSIS — Z79899 Other long term (current) drug therapy: Secondary | ICD-10-CM | POA: Diagnosis not present

## 2022-06-12 DIAGNOSIS — G8929 Other chronic pain: Secondary | ICD-10-CM | POA: Diagnosis not present

## 2022-06-12 DIAGNOSIS — E785 Hyperlipidemia, unspecified: Secondary | ICD-10-CM | POA: Diagnosis not present

## 2022-06-12 DIAGNOSIS — E039 Hypothyroidism, unspecified: Secondary | ICD-10-CM | POA: Diagnosis not present

## 2022-06-12 DIAGNOSIS — M25561 Pain in right knee: Secondary | ICD-10-CM | POA: Diagnosis not present

## 2022-06-12 DIAGNOSIS — R7303 Prediabetes: Secondary | ICD-10-CM | POA: Diagnosis not present

## 2022-06-12 DIAGNOSIS — N390 Urinary tract infection, site not specified: Secondary | ICD-10-CM | POA: Diagnosis not present

## 2022-06-13 LAB — BASIC METABOLIC PANEL
BUN: 12 (ref 4–21)
CO2: 26 — AB (ref 13–22)
Chloride: 105 (ref 99–108)
Creatinine: 0.6 (ref 0.5–1.1)
Glucose: 74
Potassium: 4.4 mEq/L (ref 3.5–5.1)
Sodium: 144 (ref 137–147)

## 2022-06-13 LAB — CBC AND DIFFERENTIAL
HCT: 35 — AB (ref 36–46)
Hemoglobin: 11.7 — AB (ref 12.0–16.0)
Platelets: 323 10*3/uL (ref 150–400)
WBC: 5.8

## 2022-06-13 LAB — COMPREHENSIVE METABOLIC PANEL
Albumin: 3.8 (ref 3.5–5.0)
Calcium: 8.9 (ref 8.7–10.7)
Globulin: 2.4

## 2022-06-13 LAB — LIPID PANEL
Cholesterol: 179 (ref 0–200)
HDL: 56 (ref 35–70)
LDL Cholesterol: 103
Triglycerides: 112 (ref 40–160)

## 2022-06-13 LAB — CBC: RBC: 3.9 (ref 3.87–5.11)

## 2022-06-13 LAB — HEPATIC FUNCTION PANEL
ALT: 14 U/L (ref 7–35)
AST: 15 (ref 13–35)
Alkaline Phosphatase: 115 (ref 25–125)
Bilirubin, Total: 0.2

## 2022-06-13 LAB — TSH: TSH: 13.2 — AB (ref 0.41–5.90)

## 2022-06-13 LAB — HEMOGLOBIN A1C: Hemoglobin A1C: 6

## 2022-06-26 DIAGNOSIS — F17200 Nicotine dependence, unspecified, uncomplicated: Secondary | ICD-10-CM | POA: Diagnosis not present

## 2022-06-26 DIAGNOSIS — G8929 Other chronic pain: Secondary | ICD-10-CM | POA: Diagnosis not present

## 2022-06-26 DIAGNOSIS — Z79899 Other long term (current) drug therapy: Secondary | ICD-10-CM | POA: Diagnosis not present

## 2022-06-28 DIAGNOSIS — M25561 Pain in right knee: Secondary | ICD-10-CM | POA: Diagnosis not present

## 2022-06-28 DIAGNOSIS — M25562 Pain in left knee: Secondary | ICD-10-CM | POA: Diagnosis not present

## 2022-06-28 DIAGNOSIS — Z96651 Presence of right artificial knee joint: Secondary | ICD-10-CM | POA: Diagnosis not present

## 2022-06-28 DIAGNOSIS — G8929 Other chronic pain: Secondary | ICD-10-CM | POA: Diagnosis not present

## 2022-06-28 DIAGNOSIS — M25361 Other instability, right knee: Secondary | ICD-10-CM | POA: Diagnosis not present

## 2022-06-29 DIAGNOSIS — E039 Hypothyroidism, unspecified: Secondary | ICD-10-CM | POA: Diagnosis not present

## 2022-06-29 DIAGNOSIS — I10 Essential (primary) hypertension: Secondary | ICD-10-CM | POA: Diagnosis not present

## 2022-06-29 DIAGNOSIS — E785 Hyperlipidemia, unspecified: Secondary | ICD-10-CM | POA: Diagnosis not present

## 2022-06-29 DIAGNOSIS — R0602 Shortness of breath: Secondary | ICD-10-CM | POA: Diagnosis not present

## 2022-06-29 DIAGNOSIS — I509 Heart failure, unspecified: Secondary | ICD-10-CM | POA: Diagnosis not present

## 2022-06-29 DIAGNOSIS — I340 Nonrheumatic mitral (valve) insufficiency: Secondary | ICD-10-CM | POA: Diagnosis not present

## 2022-07-02 ENCOUNTER — Ambulatory Visit (INDEPENDENT_AMBULATORY_CARE_PROVIDER_SITE_OTHER): Payer: Medicare Other | Admitting: Urology

## 2022-07-02 VITALS — BP 115/75 | HR 61 | Ht 64.0 in | Wt 215.0 lb

## 2022-07-02 DIAGNOSIS — Z87440 Personal history of urinary (tract) infections: Secondary | ICD-10-CM

## 2022-07-02 DIAGNOSIS — N3020 Other chronic cystitis without hematuria: Secondary | ICD-10-CM

## 2022-07-02 DIAGNOSIS — R300 Dysuria: Secondary | ICD-10-CM

## 2022-07-02 DIAGNOSIS — N3946 Mixed incontinence: Secondary | ICD-10-CM

## 2022-07-02 LAB — URINALYSIS, COMPLETE
Bilirubin, UA: NEGATIVE
Glucose, UA: NEGATIVE
Ketones, UA: NEGATIVE
Nitrite, UA: NEGATIVE
Protein,UA: NEGATIVE
RBC, UA: NEGATIVE
Specific Gravity, UA: 1.02 (ref 1.005–1.030)
Urobilinogen, Ur: 0.2 mg/dL (ref 0.2–1.0)
pH, UA: 7 (ref 5.0–7.5)

## 2022-07-02 LAB — MICROSCOPIC EXAMINATION: Epithelial Cells (non renal): >10 /hpf — AB (ref 0–10)

## 2022-07-02 MED ORDER — NITROFURANTOIN MACROCRYSTAL 100 MG PO CAPS: 100.0000 mg | ORAL_CAPSULE | Freq: Every day | ORAL | 11 refills | Status: DC

## 2022-07-02 MED ORDER — CIPROFLOXACIN HCL 500 MG PO TABS: 500.0000 mg | ORAL_TABLET | Freq: Two times a day (BID) | ORAL | 0 refills | Status: AC

## 2022-07-02 NOTE — Progress Notes (Signed)
07/02/2022 3:17 PM   Alexis Christensen Oct 18, 1970 [id number]  Referring provider: Perrin Maltese, MD [address] [place],  [place] [address]  Chief Complaint  Patient presents with   Follow-up    incontinence    HPI: The patient had InterStim on 11/26/2016 for persistent overactive bladder and urgency incontinence after sling. The patient no longer has stress incontinence and had failed multiple medications. The need for the InterStim was discussed pre-operatively.   She is continent with intermittent urgency. She used to have mild bedwetting and does not have it either. Frequency also improved.    She stays quite dry during the day.  She gets up a few times at night.  If she holds it too long she can has some foot on the floor syndrome but the device appears to be working very well   Almost completely continent and very pleased.  N   Patient has not been seen for a few years.   And was doing great until 2 months ago.  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  High-volume leakage.  No cystitis symptoms.  She has never changed the program or amplitude.   Patient has a new onset urge incontinence.  During troubleshooting that was only 1 program.  Impedance was normal.  It was turned up amplitude and she felt it in the vagina.  Judson Roch will speak to the representative and start up programming exercise and look into its data   Today Frequency stable.  When amplitude was increased patient did well.  She was not leaking.  Patient is completely continent and very pleased.  No infections.  Has not changed the amplitude or any settings.  I will see the patient as needed. Battery life discussed     Today Patient says she is getting approximately 2 bladder infections month with burning cramping and frequency that respond favorably to antibiotics.  InterStim still works really well.  She thinks she may be infected today.  I did not see recent urine cultures.  She had a CT scan  last year that was normal.  She has a number of allergies    PMH: Past Medical History:  Diagnosis Date   Acute diastolic (congestive) heart failure (HCC)    ADHD    Adopted    Anemia    Anxiety    Arthritis    Asthma    B12 deficiency    Bilateral swelling of feet and ankles    Bipolar 1 disorder ([place])    Bipolar disorder ([place])    Carpal tunnel syndrome 09/15/2014   Chest pain    CHF (congestive heart failure) ([place])    Dialostic CHF   Chronic diarrhea 09/07/2015   Chronic kidney disease    H/O KIDNEY STONES   Chronic venous insufficiency 04/26/2016   Depression    DVT (deep venous thrombosis) ([place]) 2016   RIGHT LEG   Edema leg    Effusion of knee 12/30/2013   Family history of adverse reaction to anesthesia    ADOPTED   Food allergy    GERD (gastroesophageal reflux disease)    Goiter    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   Headache(784.0)    MIGRAINES   Heart burn    Heart murmur    History of blood clots    History of kidney stones    HLD (hyperlipidemia)    Hx MRSA infection    Hyperlipidemia    Hypothyroidism    Lipoma  of arm 05/11/2013   Lower extremity edema    Lymphedema 04/26/2016   Other fatigue    Post traumatic stress disorder (PTSD)    Prediabetes    PVD (peripheral vascular disease) (HCC)    Seasonal allergies    Shortness of breath    Shortness of breath on exertion    Stress incontinence    Thyroid disease    Urge incontinence    Vitamin D deficiency     Surgical History: Past Surgical History:  Procedure Laterality Date   ABDOMINAL HYSTERECTOMY     ANKLE SURGERY     CHOLECYSTECTOMY     COLONOSCOPY WITH PROPOFOL N/A 06/24/2019   Procedure: COLONOSCOPY WITH PROPOFOL;  Surgeon: Toledo, Benay Pike, MD;  Location: [facility];  Service: Gastroenterology;  Laterality: N/A;   CYSTOSCOPY N/A 04/09/2016   Procedure: CYSTOSCOPY;  Surgeon: Bjorn Loser, MD;  Location: [facility];  Service: Urology;  Laterality: N/A;   DILATION AND  CURETTAGE OF UTERUS     ESOPHAGOGASTRODUODENOSCOPY (EGD) WITH PROPOFOL N/A 06/24/2019   Procedure: ESOPHAGOGASTRODUODENOSCOPY (EGD) WITH PROPOFOL;  Surgeon: Toledo, Benay Pike, MD;  Location: [facility];  Service: Gastroenterology;  Laterality: N/A;   INGUINAL HERNIA REPAIR Bilateral 04/18/2017   Procedure: LAPAROSCOPIC BILATERAL INGUINAL HERNIA REPAIR;  Surgeon: Jules Husbands, MD;  Location: [facility];  Service: General;  Laterality: Bilateral;   INTERSTIM IMPLANT PLACEMENT N/A 11/26/2016   Procedure: Barrie Lyme IMPLANT FIRST STAGE;  Surgeon: Bjorn Loser, MD;  Location: [facility];  Service: Urology;  Laterality: N/A;   INTERSTIM IMPLANT PLACEMENT N/A 11/26/2016   Procedure: Barrie Lyme IMPLANT SECOND STAGE;  Surgeon: Bjorn Loser, MD;  Location: [facility];  Service: Urology;  Laterality: N/A;   JOINT REPLACEMENT Right    knee   KNEE ARTHROSCOPY Bilateral    KNEE ARTHROSCOPY WITH LATERAL RELEASE Left 10/18/2015   Procedure: KNEE ARTHROSCOPY LATERAL AND PARTIAL SYNOVECTOMY;  Surgeon: Hessie Knows, MD;  Location: [facility];  Service: Orthopedics;  Laterality: Left;   Lymph Node removal  2015   Neck   PUBOVAGINAL SLING N/A 04/09/2016   Procedure: PUBO-VAGINAL SLING/ RETROPUBIC SLING;  Surgeon: Bjorn Loser, MD;  Location: [facility];  Service: Urology;  Laterality: N/A;   REPLACEMENT TOTAL KNEE Right    SHOULDER SURGERY Right 2014   TUBAL LIGATION     WRIST SURGERY Right    metal plate    Home Medications:  Allergies as of 07/02/2022       Reactions   Geodon [ziprasidone Hydrochloride] Other (See Comments)   Numbness ,sob, headaches, blurred vision   Sulfacetamide Sodium Hives   Ziprasidone Anaphylaxis   Other reaction(s): Other (See Comments) Numbness ,sob, headaches, blurred vision Other reaction(s): Other (See Comments), Unknown Numbness ,sob, headaches, blurred vision Numbness ,sob, headaches, blurred vision Other reaction(s): Other (See Comments), Unknown Numbness ,sob,  headaches, blurred vision Numbness ,sob, headaches, blurred vision Numbness ,sob, headaches, blurred vision Other reaction(s): Other (See Comments), Unknown Numbness ,sob, headaches, blurred vision Numbness ,sob, headaches, blurred vision Other reaction(s): Other (See Comments), Unknown Numbness ,sob, headaches, blurred vision Numbness ,sob, headaches, blurred vision   Ziprasidone Hcl Anaphylaxis, Other (See Comments)   Other reaction(s): Other (See Comments), Unknown Numbness ,sob, headaches, blurred vision Numbness ,sob, headaches, blurred vision   Ace Inhibitors Rash   Erythromycin Rash, Hives   Hydromorphone Rash   Lamictal [lamotrigine] Rash   Other reaction(s): Unknown   Strawberry (diagnostic) Rash   Sulfa Antibiotics Hives, Rash   Other reaction(s): Unknown Other reaction(s): Unknown Other reaction(s): Unknown  Medication List        Accurate as of July 02, 2022  3:17 PM. If you have any questions, ask your nurse or doctor.          amitriptyline 75 MG tablet Commonly known as: ELAVIL Take 1 tablet by mouth at bedtime.   ARIPiprazole 20 MG tablet Commonly known as: ABILIFY Take 20 mg by mouth at bedtime as needed.   aspirin 81 MG chewable tablet Aspirin 81 MG Oral Tablet Chewable QTY: 30 tablet Days: 30 Refills: 0  Written: 01/23/21 Patient Instructions: once a day   colestipol 1 g tablet Commonly known as: COLESTID Take 2 g by mouth 2 (two) times daily.   cyanocobalamin 500 MCG tablet Commonly known as: VITAMIN B12 Take 1 tablet (500 mcg total) by mouth daily.   dicyclomine 10 MG capsule Commonly known as: BENTYL Take 10 mg by mouth every 8 (eight) hours as needed.   DULoxetine 60 MG capsule Commonly known as: CYMBALTA Take 60 mg by mouth 2 (two) times daily.   furosemide 20 MG tablet Commonly known as: LASIX Take 40 mg by mouth daily.   gabapentin 600 MG tablet Commonly known as: NEURONTIN Take 600 mg by mouth 2 (two) times  daily.   hydrOXYzine 25 MG tablet Commonly known as: ATARAX Take 50 mg by mouth every 8 (eight) hours as needed for itching.   Klor-Con M10 10 MEQ tablet Generic drug: potassium chloride Take 10 mEq by mouth daily.   levothyroxine 175 MCG tablet Commonly known as: SYNTHROID Take 175 mcg by mouth daily before breakfast.   lisinopril 5 MG tablet Commonly known as: ZESTRIL Take 5 mg by mouth daily.   metFORMIN 500 MG tablet Commonly known as: GLUCOPHAGE Take 1 tablet (500 mg total) by mouth 3 (three) times daily.   nitrofurantoin (macrocrystal-monohydrate) 100 MG capsule Commonly known as: MACROBID Take 100 mg by mouth 2 (two) times daily.   pantoprazole 20 MG tablet Commonly known as: PROTONIX Take 20 mg by mouth daily.   Repatha SureClick 597 MG/ML So[place] Generic drug: Evolocumab Inject into the skin.   rosuvastatin 40 MG tablet Commonly known as: CRESTOR Take 40 mg by mouth daily.   spironolactone 25 MG tablet Commonly known as: ALDACTONE Take 25 mg by mouth daily. Take 1/2 tab daily   Suboxone 8-2 MG Film Generic drug: Buprenorphine HCl-Naloxone HCl Dissolve 1 film under tongue twice daily   Vitamin D (Ergocalciferol) 1.25 MG (50000 UNIT) Caps capsule Commonly known as: DRISDOL 1 po q wed and 1 po q sun        Allergies:  Allergies  Allergen Reactions   Geodon [Ziprasidone Hydrochloride] Other (See Comments)    Numbness ,sob, headaches, blurred vision   Sulfacetamide Sodium Hives   Ziprasidone Anaphylaxis    Other reaction(s): Other (See Comments) Numbness ,sob, headaches, blurred vision  Other reaction(s): Other (See Comments), Unknown Numbness ,sob, headaches, blurred vision Numbness ,sob, headaches, blurred vision  Other reaction(s): Other (See Comments), Unknown Numbness ,sob, headaches, blurred vision Numbness ,sob, headaches, blurred vision   Numbness ,sob, headaches, blurred vision Other reaction(s): Other (See Comments),  Unknown Numbness ,sob, headaches, blurred vision Numbness ,sob, headaches, blurred vision Other reaction(s): Other (See Comments), Unknown Numbness ,sob, headaches, blurred vision Numbness ,sob, headaches, blurred vision   Ziprasidone Hcl Anaphylaxis and Other (See Comments)    Other reaction(s): Other (See Comments), Unknown Numbness ,sob, headaches, blurred vision Numbness ,sob, headaches, blurred vision   Ace Inhibitors Rash   Erythromycin Rash  and Hives   Hydromorphone Rash   Lamictal [Lamotrigine] Rash    Other reaction(s): Unknown   Strawberry (Diagnostic) Rash   Sulfa Antibiotics Hives and Rash    Other reaction(s): Unknown Other reaction(s): Unknown Other reaction(s): Unknown    Family History: Family History  Adopted: Yes  Family history unknown: Yes    Social History:  reports that she quit smoking about 2 years ago. Her smoking use included cigarettes. She started smoking about 13 years ago. She has a 5.00 pack-year smoking history. She has quit using smokeless tobacco. She reports that she does not currently use alcohol. She reports current drug use. Drug: Other-see comments.  ROS:                                        Physical Exam: BP 115/75   Pulse 61   Ht '5\' 4"'$  (1.626 m)   Wt 97.5 kg   BMI 36.90 kg/m   Constitutional:  Alert and oriented, No acute distress. HEENT: [place] AT, moist mucus membranes.  Trachea midline, no masses.   Laboratory Data: Lab Results  Component Value Date   WBC 6.2 02/25/2022   HGB 12.4 02/25/2022   HCT 38.8 02/25/2022   MCV 89.2 02/25/2022   PLT 339 02/25/2022    Lab Results  Component Value Date   CREATININE 0.69 02/25/2022    No results found for: "PSA"  No results found for: "TESTOSTERONE"  Lab Results  Component Value Date   HGBA1C 5.9 02/01/2022    Urinalysis    Component Value Date/Time   COLORURINE YELLOW (A) 02/25/2022 1435   APPEARANCEUR TURBID (A) 02/25/2022 1435    APPEARANCEUR Cloudy (A) 12/05/2020 1603   LABSPEC 1.029 02/25/2022 1435   PHURINE 5.0 02/25/2022 1435   GLUCOSEU NEGATIVE 02/25/2022 1435   HGBUR NEGATIVE 02/25/2022 1435   BILIRUBINUR NEGATIVE 02/25/2022 1435   BILIRUBINUR Negative 12/05/2020 1603   KETONESUR NEGATIVE 02/25/2022 1435   PROTEINUR 30 (A) 02/25/2022 1435   UROBILINOGEN 1.0 06/18/2011 1258   NITRITE NEGATIVE 02/25/2022 1435   LEUKOCYTESUR TRACE (A) 02/25/2022 1435    Pertinent Imaging:   Assessment & Plan: I thought it was best to send in ciprofloxacin 250 mg twice a day for 7 days.  Pathophysiology of UTIs discussed.  Reassess on Macrodantin 100 mg 3 x 11 in 8 weeks  1. Mixed incontinence  - Urinalysis, Complete   No follow-ups on file.  Reece Packer, MD  [facility] [address] [place], [place] [address] [phone]

## 2022-07-03 ENCOUNTER — Ambulatory Visit (INDEPENDENT_AMBULATORY_CARE_PROVIDER_SITE_OTHER): Payer: Medicare Other | Admitting: Family Medicine

## 2022-07-03 ENCOUNTER — Encounter (INDEPENDENT_AMBULATORY_CARE_PROVIDER_SITE_OTHER): Payer: Self-pay

## 2022-07-03 ENCOUNTER — Other Ambulatory Visit (INDEPENDENT_AMBULATORY_CARE_PROVIDER_SITE_OTHER): Payer: Self-pay | Admitting: Family Medicine

## 2022-07-03 ENCOUNTER — Encounter (INDEPENDENT_AMBULATORY_CARE_PROVIDER_SITE_OTHER): Payer: Self-pay | Admitting: Family Medicine

## 2022-07-03 VITALS — BP 106/74 | HR 65 | Temp 97.5°F | Ht 64.0 in | Wt 212.8 lb

## 2022-07-03 DIAGNOSIS — E559 Vitamin D deficiency, unspecified: Secondary | ICD-10-CM

## 2022-07-03 DIAGNOSIS — G8929 Other chronic pain: Secondary | ICD-10-CM | POA: Diagnosis not present

## 2022-07-03 DIAGNOSIS — R7303 Prediabetes: Secondary | ICD-10-CM

## 2022-07-03 DIAGNOSIS — E538 Deficiency of other specified B group vitamins: Secondary | ICD-10-CM

## 2022-07-03 DIAGNOSIS — E669 Obesity, unspecified: Secondary | ICD-10-CM | POA: Diagnosis not present

## 2022-07-03 DIAGNOSIS — Z6836 Body mass index (BMI) 36.0-36.9, adult: Secondary | ICD-10-CM

## 2022-07-03 MED ORDER — CYANOCOBALAMIN 500 MCG PO TABS: 500.0000 ug | ORAL_TABLET | Freq: Every day | ORAL | 0 refills | Status: DC

## 2022-07-03 MED ORDER — VITAMIN D (ERGOCALCIFEROL) 1.25 MG (50000 UNIT) PO CAPS: ORAL_CAPSULE | ORAL | 0 refills | Status: DC

## 2022-07-03 MED ORDER — METFORMIN HCL 500 MG PO TABS: 500.0000 mg | ORAL_TABLET | Freq: Three times a day (TID) | ORAL | 0 refills | Status: DC

## 2022-07-04 LAB — VITAMIN D 25 HYDROXY (VIT D DEFICIENCY, FRACTURES): Vit D, 25-Hydroxy: 16.2 ng/mL — ABNORMAL LOW (ref 30.0–100.0)

## 2022-07-04 LAB — VITAMIN B12: Vitamin B-12: 366 pg/mL (ref 232–1245)

## 2022-07-05 LAB — CULTURE, URINE COMPREHENSIVE

## 2022-07-06 DIAGNOSIS — T8484XA Pain due to internal orthopedic prosthetic devices, implants and grafts, initial encounter: Secondary | ICD-10-CM | POA: Diagnosis not present

## 2022-07-06 DIAGNOSIS — M7050 Other bursitis of knee, unspecified knee: Secondary | ICD-10-CM | POA: Diagnosis not present

## 2022-07-06 DIAGNOSIS — Z96651 Presence of right artificial knee joint: Secondary | ICD-10-CM | POA: Diagnosis not present

## 2022-07-14 DIAGNOSIS — I10 Essential (primary) hypertension: Secondary | ICD-10-CM | POA: Diagnosis not present

## 2022-07-14 DIAGNOSIS — E785 Hyperlipidemia, unspecified: Secondary | ICD-10-CM | POA: Diagnosis not present

## 2022-07-14 DIAGNOSIS — E039 Hypothyroidism, unspecified: Secondary | ICD-10-CM | POA: Diagnosis not present

## 2022-07-23 DIAGNOSIS — M25562 Pain in left knee: Secondary | ICD-10-CM | POA: Diagnosis not present

## 2022-07-24 NOTE — Progress Notes (Unsigned)
TeleHealth Visit:  This visit was completed with telemedicine (audio/video) technology. Alexis Christensen has verbally consented to this TeleHealth visit. The patient is located at home, the provider is located at home. The participants in this visit include the listed provider and patient. The visit was conducted today via MyChart video.  OBESITY Alexis Christensen is here to discuss her progress with her obesity treatment plan along with follow-up of her obesity related diagnoses.   Today's visit was # 19 Starting weight: 229 lbs Starting date: 03/28/2021 Weight at last in office visit: 212 lbs on 07/03/22 Total weight loss: 17 lbs at last in office visit on 07/03/22. Today's reported weight: *** lbs No weight reported.  Nutrition Plan:  Plan + breakfast and lunch option.   Current exercise: {exercise types:16438}  Interim History:  ***  Assessment/Plan:  1. ***  2. ***  3. ***  Obesity: Current BMI *** Alexis Christensen {CHL AMB IS/IS NOT:210130109} currently in the action stage of change. As such, her goal is to {MWMwtloss#1:210800005}.  She has agreed to {MWMwtlossportion/plan2:23431}.   Exercise goals: {MWM EXERCISE RECS:23473}  Behavioral modification strategies: {MWMwtlossdietstrategies3:23432}.  Alexis Christensen has agreed to follow-up with our clinic in {NUMBER 1-10:22536} weeks.   No orders of the defined types were placed in this encounter.   There are no discontinued medications.   No orders of the defined types were placed in this encounter.     Objective:   VITALS: Per patient if applicable, see vitals. GENERAL: Alert and in no acute distress. CARDIOPULMONARY: No increased WOB. Speaking in clear sentences.  PSYCH: Pleasant and cooperative. Speech normal rate and rhythm. Affect is appropriate. Insight and judgement are appropriate. Attention is focused, linear, and appropriate.  NEURO: Oriented as arrived to appointment on time with no prompting.   Lab Results  Component Value Date    CREATININE 0.6 06/13/2022   BUN 12 06/13/2022   NA 144 06/13/2022   K 4.4 06/13/2022   CL 105 06/13/2022   CO2 26 (A) 06/13/2022   Lab Results  Component Value Date   ALT 14 06/13/2022   AST 15 06/13/2022   ALKPHOS 115 06/13/2022   BILITOT 0.5 02/14/2022   Lab Results  Component Value Date   HGBA1C 6.0 06/13/2022   HGBA1C 5.9 02/01/2022   HGBA1C 5.7 06/20/2021   HGBA1C 6.0 03/06/2021   Lab Results  Component Value Date   INSULIN 13.8 02/14/2022   INSULIN 6.4 08/01/2021   INSULIN 7.4 03/28/2021   Lab Results  Component Value Date   TSH 13.20 (A) 06/13/2022   Lab Results  Component Value Date   CHOL 179 06/13/2022   HDL 56 06/13/2022   LDLCALC 103 06/13/2022   TRIG 112 06/13/2022   Lab Results  Component Value Date   WBC 5.8 06/13/2022   HGB 11.7 (A) 06/13/2022   HCT 35 (A) 06/13/2022   MCV 89.2 02/25/2022   PLT 323 06/13/2022   Lab Results  Component Value Date   IRON 39 07/13/2016   TIBC 408 07/13/2016   FERRITIN 11 07/13/2016   Lab Results  Component Value Date   VD25OH 16.2 (L) 07/03/2022   VD25OH 19.6 (L) 02/14/2022   VD25OH 25.7 (L) 08/01/2021    Attestation Statements:   Reviewed by clinician on day of visit: allergies, medications, problem list, medical history, surgical history, family history, social history, and previous encounter notes.  ***(delete if time-based billing not used) Time spent on visit including the items listed below was *** minutes.  -preparing to see the  patient (e.g., review of tests, history, previous notes) -obtaining and/or reviewing separately obtained history -counseling and educating the patient/family/caregiver -documenting clinical information in the electronic or other health record -ordering medications, tests, or procedures -independently interpreting results and communicating results to the patient/ family/caregiver -referring and communicating with other health care professionals  -care coordination

## 2022-07-25 ENCOUNTER — Encounter (INDEPENDENT_AMBULATORY_CARE_PROVIDER_SITE_OTHER): Payer: Self-pay | Admitting: Family Medicine

## 2022-07-25 ENCOUNTER — Telehealth (INDEPENDENT_AMBULATORY_CARE_PROVIDER_SITE_OTHER): Payer: Medicare Other | Admitting: Family Medicine

## 2022-07-25 DIAGNOSIS — E538 Deficiency of other specified B group vitamins: Secondary | ICD-10-CM

## 2022-07-25 DIAGNOSIS — R7303 Prediabetes: Secondary | ICD-10-CM

## 2022-07-25 DIAGNOSIS — E669 Obesity, unspecified: Secondary | ICD-10-CM

## 2022-07-25 DIAGNOSIS — E559 Vitamin D deficiency, unspecified: Secondary | ICD-10-CM

## 2022-07-25 DIAGNOSIS — Z6836 Body mass index (BMI) 36.0-36.9, adult: Secondary | ICD-10-CM

## 2022-07-25 MED ORDER — METFORMIN HCL 500 MG PO TABS: 500.0000 mg | ORAL_TABLET | Freq: Three times a day (TID) | ORAL | 0 refills | Status: DC

## 2022-07-25 MED ORDER — CHOLECALCIFEROL 1.25 MG (50000 UT) PO TABS: ORAL_TABLET | ORAL | 0 refills | Status: DC

## 2022-07-26 DIAGNOSIS — G8929 Other chronic pain: Secondary | ICD-10-CM | POA: Diagnosis not present

## 2022-07-26 DIAGNOSIS — Z79899 Other long term (current) drug therapy: Secondary | ICD-10-CM | POA: Diagnosis not present

## 2022-07-27 DIAGNOSIS — M25562 Pain in left knee: Secondary | ICD-10-CM | POA: Diagnosis not present

## 2022-07-30 DIAGNOSIS — R7303 Prediabetes: Secondary | ICD-10-CM | POA: Diagnosis not present

## 2022-07-30 DIAGNOSIS — M25562 Pain in left knee: Secondary | ICD-10-CM | POA: Diagnosis not present

## 2022-07-30 DIAGNOSIS — E039 Hypothyroidism, unspecified: Secondary | ICD-10-CM | POA: Diagnosis not present

## 2022-07-30 DIAGNOSIS — E785 Hyperlipidemia, unspecified: Secondary | ICD-10-CM | POA: Diagnosis not present

## 2022-07-30 NOTE — Progress Notes (Signed)
Chief Complaint:   OBESITY Alexis Christensen is here to discuss her progress with her obesity treatment plan along with follow-up of her obesity related diagnoses. Alexis Christensen is on the Category 1 Plan with breakfast and lunch options.  And states she is following her eating plan approximately 75% of the time. Alexis Christensen states she is walking 30 minutes 3 times per week.  Today's visit was #: 38 Starting weight: 229 LBS Starting date: 03/28/2021 Today's weight: 212 LBS Today's date: 07/03/2022 Total lbs lost to date: 17 LBS Total lbs lost since last in-office visit: +6 LBS  Interim History: A lot of stress with knee pain and needing possible replacement of the joint.  Patient stress with family as well.  Eating off plan much more than usual.  Subjective:   1. Pre-diabetes 06/12/2022 A1c was 6.0.  Patient had pizza checking cheese last night. Kawanna M Colledge is tolerating medication(s) well without side effects.  Medication compliance is good as patient endorses taking it as prescribed.  Symptoms are stable and the patient denies additional concerns regarding this condition.    2. B12 deficiency Energy improving, patient taking B12 OTC daily and PNP.  Patient is taking B12 500 mcg daily OTC.  3. Vitamin D deficiency Last vitamin D level 19.6 about 4 months ago.  Compliance great and takes weekly on 'Sunday only Wednesday.  Assessment/Plan:   Orders Placed This Encounter  Procedures   VITAMIN D 25 Hydroxy (Vit-D Deficiency, Fractures)   Vitamin B12    Medications Discontinued During This Encounter  Medication Reason   vitamin B-12 (CYANOCOBALAMIN) 500 MCG tablet Reorder   Vitamin D, Ergocalciferol, (DRISDOL) 1.25 MG (50000 UNIT) CAPS capsule Reorder   metFORMIN (GLUCOPHAGE) 500 MG tablet Reorder     Meds ordered this encounter  Medications   DISCONTD: Vitamin D, Ergocalciferol, (DRISDOL) 1.25 MG (50000 UNIT) CAPS capsule    Sig: 1 po q wed and 1 po q sun    Dispense:  8 capsule    Refill:  0     30'$  d supply;  ** OV for RF **   Do not send RF request   DISCONTD: metFORMIN (GLUCOPHAGE) 500 MG tablet    Sig: Take 1 tablet (500 mg total) by mouth 3 (three) times daily.    Dispense:  90 tablet    Refill:  0    30 d supply;  ** OV for RF **   Do not send RF request   cyanocobalamin (VITAMIN B12) 500 MCG tablet    Sig: Take 1 tablet (500 mcg total) by mouth daily.    Dispense:  90 tablet    Refill:  0     1. Pre-diabetes We will forego fasting insulin today due to poor dietary choices.  Yesterday A1c was from 5.9 to 6.0 on 06/12/2022.  2. B12 deficiency Check B12 level today.  Continue supplementation and PNP.  Refill- cyanocobalamin (VITAMIN B12) 500 MCG tablet; Take 1 tablet (500 mcg total) by mouth daily.  Dispense: 90 tablet; Refill: 0  - Vitamin B12  3. Vitamin D deficiency Check vitamin D level today and refill ergocalciferol.  - VITAMIN D 25 Hydroxy (Vit-D Deficiency, Fractures)  4. Obesity, Current BMI 36.5 Goal is to not gain over Christmas and New Year's holiday.  Eating guide given to patient.  Go over labs next office visit and we will abstract all the other recent months.  Alexis Christensen is currently in the action stage of change. As such, her goal is  to continue with weight loss efforts. She has agreed to the Category 1 Plan with breakfast and lunch options.  Exercise goals:  As is.  Behavioral modification strategies: holiday eating strategies .  Alexis Christensen has agreed to follow-up with our clinic in 4 weeks. She was informed of the importance of frequent follow-up visits to maximize her success with intensive lifestyle modifications for her multiple health conditions.   Objective:   Blood pressure 106/74, pulse 65, temperature (!) 97.5 F (36.4 C), height '5\' 4"'$  (1.626 m), weight 212 lb 12.8 oz (96.5 kg), SpO2 98 %. Body mass index is 36.53 kg/m.  General: Cooperative, alert, well developed, in no acute distress. HEENT: Conjunctivae and lids  unremarkable. Cardiovascular: Regular rhythm.  Lungs: Normal work of breathing. Neurologic: No focal deficits.   Lab Results  Component Value Date   CREATININE 0.6 06/13/2022   BUN 12 06/13/2022   NA 144 06/13/2022   K 4.4 06/13/2022   CL 105 06/13/2022   CO2 26 (A) 06/13/2022   Lab Results  Component Value Date   ALT 14 06/13/2022   AST 15 06/13/2022   ALKPHOS 115 06/13/2022   BILITOT 0.5 02/14/2022   Lab Results  Component Value Date   HGBA1C 6.0 06/13/2022   HGBA1C 5.9 02/01/2022   HGBA1C 5.7 06/20/2021   HGBA1C 6.0 03/06/2021   Lab Results  Component Value Date   INSULIN 13.8 02/14/2022   INSULIN 6.4 08/01/2021   INSULIN 7.4 03/28/2021   Lab Results  Component Value Date   TSH 13.20 (A) 06/13/2022   Lab Results  Component Value Date   CHOL 179 06/13/2022   HDL 56 06/13/2022   LDLCALC 103 06/13/2022   TRIG 112 06/13/2022   Lab Results  Component Value Date   VD25OH 16.2 (L) 07/03/2022   VD25OH 19.6 (L) 02/14/2022   VD25OH 25.7 (L) 08/01/2021   Lab Results  Component Value Date   WBC 5.8 06/13/2022   HGB 11.7 (A) 06/13/2022   HCT 35 (A) 06/13/2022   MCV 89.2 02/25/2022   PLT 323 06/13/2022   Lab Results  Component Value Date   IRON 39 07/13/2016   TIBC 408 07/13/2016   FERRITIN 11 07/13/2016   Attestation Statements:   Reviewed by clinician on day of visit: allergies, medications, problem list, medical history, surgical history, family history, social history, and previous encounter notes.  I, Davy Pique, RMA, am acting as Location manager for [employer], DO.   I have reviewed the above documentation for accuracy and completeness, and I agree with the above. Marjory Sneddon, D.O.  The [place] was signed into law in 2016 which includes the topic of electronic health records.  This provides immediate access to information in MyChart.  This includes consultation notes, operative notes, office notes, lab results and  pathology reports.  If you have any questions about what you read please let us know at your next visit so we can discuss your concerns and take corrective action if need be.  We are right here with you.

## 2022-08-06 DIAGNOSIS — M25562 Pain in left knee: Secondary | ICD-10-CM | POA: Diagnosis not present

## 2022-08-07 DIAGNOSIS — G8929 Other chronic pain: Secondary | ICD-10-CM | POA: Diagnosis not present

## 2022-08-08 DIAGNOSIS — R200 Anesthesia of skin: Secondary | ICD-10-CM | POA: Diagnosis not present

## 2022-08-08 DIAGNOSIS — R202 Paresthesia of skin: Secondary | ICD-10-CM | POA: Diagnosis not present

## 2022-08-13 DIAGNOSIS — M25562 Pain in left knee: Secondary | ICD-10-CM | POA: Diagnosis not present

## 2022-08-13 DIAGNOSIS — M25561 Pain in right knee: Secondary | ICD-10-CM | POA: Diagnosis not present

## 2022-08-15 DIAGNOSIS — R0602 Shortness of breath: Secondary | ICD-10-CM | POA: Diagnosis not present

## 2022-08-15 DIAGNOSIS — J449 Chronic obstructive pulmonary disease, unspecified: Secondary | ICD-10-CM | POA: Diagnosis not present

## 2022-08-15 DIAGNOSIS — G479 Sleep disorder, unspecified: Secondary | ICD-10-CM | POA: Diagnosis not present

## 2022-08-15 DIAGNOSIS — R059 Cough, unspecified: Secondary | ICD-10-CM | POA: Diagnosis not present

## 2022-08-17 ENCOUNTER — Encounter: Payer: Self-pay | Admitting: Oncology

## 2022-08-17 DIAGNOSIS — M25562 Pain in left knee: Secondary | ICD-10-CM | POA: Diagnosis not present

## 2022-08-21 ENCOUNTER — Encounter: Payer: Self-pay | Admitting: Oncology

## 2022-08-21 DIAGNOSIS — Z79899 Other long term (current) drug therapy: Secondary | ICD-10-CM | POA: Diagnosis not present

## 2022-08-22 DIAGNOSIS — R200 Anesthesia of skin: Secondary | ICD-10-CM | POA: Diagnosis not present

## 2022-08-22 DIAGNOSIS — M25562 Pain in left knee: Secondary | ICD-10-CM | POA: Diagnosis not present

## 2022-08-22 DIAGNOSIS — R29818 Other symptoms and signs involving the nervous system: Secondary | ICD-10-CM | POA: Diagnosis not present

## 2022-08-22 DIAGNOSIS — G2581 Restless legs syndrome: Secondary | ICD-10-CM | POA: Diagnosis not present

## 2022-08-22 DIAGNOSIS — R202 Paresthesia of skin: Secondary | ICD-10-CM | POA: Diagnosis not present

## 2022-08-24 ENCOUNTER — Other Ambulatory Visit: Payer: Self-pay | Admitting: Cardiovascular Disease

## 2022-08-24 DIAGNOSIS — M25562 Pain in left knee: Secondary | ICD-10-CM | POA: Diagnosis not present

## 2022-08-24 DIAGNOSIS — I509 Heart failure, unspecified: Secondary | ICD-10-CM

## 2022-08-27 ENCOUNTER — Ambulatory Visit: Payer: Medicare Other | Admitting: Urology

## 2022-08-28 ENCOUNTER — Ambulatory Visit (INDEPENDENT_AMBULATORY_CARE_PROVIDER_SITE_OTHER): Payer: 59 | Admitting: Family Medicine

## 2022-08-28 ENCOUNTER — Other Ambulatory Visit (INDEPENDENT_AMBULATORY_CARE_PROVIDER_SITE_OTHER): Payer: Self-pay | Admitting: Family Medicine

## 2022-08-28 ENCOUNTER — Encounter (INDEPENDENT_AMBULATORY_CARE_PROVIDER_SITE_OTHER): Payer: Self-pay | Admitting: Family Medicine

## 2022-08-28 VITALS — BP 120/87 | HR 63 | Temp 98.2°F | Ht 64.0 in | Wt 219.0 lb

## 2022-08-28 DIAGNOSIS — E559 Vitamin D deficiency, unspecified: Secondary | ICD-10-CM

## 2022-08-28 DIAGNOSIS — R7303 Prediabetes: Secondary | ICD-10-CM

## 2022-08-28 DIAGNOSIS — F39 Unspecified mood [affective] disorder: Secondary | ICD-10-CM

## 2022-08-28 DIAGNOSIS — E063 Autoimmune thyroiditis: Secondary | ICD-10-CM | POA: Diagnosis not present

## 2022-08-28 DIAGNOSIS — Z6837 Body mass index (BMI) 37.0-37.9, adult: Secondary | ICD-10-CM | POA: Insufficient documentation

## 2022-08-28 MED ORDER — METFORMIN HCL 500 MG PO TABS: 500.0000 mg | ORAL_TABLET | Freq: Three times a day (TID) | ORAL | 0 refills | Status: DC

## 2022-08-28 MED ORDER — VITAMIN D (ERGOCALCIFEROL) 1.25 MG (50000 UNIT) PO CAPS: ORAL_CAPSULE | ORAL | 0 refills | Status: DC

## 2022-08-29 ENCOUNTER — Encounter: Payer: Self-pay | Admitting: Oncology

## 2022-08-29 DIAGNOSIS — M25562 Pain in left knee: Secondary | ICD-10-CM | POA: Diagnosis not present

## 2022-09-03 DIAGNOSIS — M25562 Pain in left knee: Secondary | ICD-10-CM | POA: Diagnosis not present

## 2022-09-04 DIAGNOSIS — G8929 Other chronic pain: Secondary | ICD-10-CM | POA: Diagnosis not present

## 2022-09-04 DIAGNOSIS — Z79899 Other long term (current) drug therapy: Secondary | ICD-10-CM | POA: Diagnosis not present

## 2022-09-05 DIAGNOSIS — M7050 Other bursitis of knee, unspecified knee: Secondary | ICD-10-CM | POA: Diagnosis not present

## 2022-09-05 DIAGNOSIS — M25562 Pain in left knee: Secondary | ICD-10-CM | POA: Diagnosis not present

## 2022-09-05 DIAGNOSIS — Z96651 Presence of right artificial knee joint: Secondary | ICD-10-CM | POA: Diagnosis not present

## 2022-09-05 DIAGNOSIS — T8484XA Pain due to internal orthopedic prosthetic devices, implants and grafts, initial encounter: Secondary | ICD-10-CM | POA: Diagnosis not present

## 2022-09-08 DIAGNOSIS — Z96651 Presence of right artificial knee joint: Secondary | ICD-10-CM | POA: Diagnosis not present

## 2022-09-08 DIAGNOSIS — T8484XA Pain due to internal orthopedic prosthetic devices, implants and grafts, initial encounter: Secondary | ICD-10-CM | POA: Diagnosis not present

## 2022-09-10 NOTE — Progress Notes (Unsigned)
Chief Complaint:   OBESITY Alexis Christensen is here to discuss her progress with her obesity treatment plan along with follow-up of her obesity related diagnoses. Alexis Christensen is on keeping a food journal and adhering to recommended goals of 1000-1100 calories and 80+ protein and states she is following her eating plan approximately 50% of the time. Alexis Christensen states she is not exercising.   Today's visit was #: 20 Starting weight: 229 lbs Starting date: 03/28/2021 Today's weight: 219 lbs Today's date: 08/28/2022 Total lbs lost to date: 10 LBS Total lbs lost since last in-office visit: +7 lbs  Interim History: Patient's fianc Charlie age 4, recently passed away unexpectedly due to acute PNA and complication of that.  Patient went into the ED on Aug 03, 2022 and passed on August 06, 2022.  Patient told me of the entire ordeal, very tearful during office visit.  Patient denies suicidal or homicidal ideation and has a good support system.  Subjective:   1. Pre-diabetes Alexis Christensen has a diagnosis of prediabetes based on her elevated HgA1c and was informed this puts her at greater risk of developing diabetes. She continues to work on diet and exercise to decrease her risk of diabetes. She denies nausea or hypoglycemia.  2. Vitamin D deficiency Alexis Christensen is tolerating medication(s) well without side effects.  Medication compliance is good as patient endorses taking it as prescribed.  Symptoms are stable and the patient denies additional concerns regarding this condition.  Vitamin D not at goal.   3. Mood disorder (Alexis Christensen) Acute stress reaction.  Alexis Christensen is the patients psychiatrist and patients team is aware and has sent her to grief counseling.   Assessment/Plan:  No orders of the defined types were placed in this encounter.   Medications Discontinued During This Encounter  Medication Reason   amitriptyline (ELAVIL) 75 MG tablet Completed Course   Cholecalciferol 1.25 MG (50000 UT) TABS    metFORMIN (GLUCOPHAGE)  500 MG tablet Reorder     Meds ordered this encounter  Medications   Vitamin D, Ergocalciferol, (DRISDOL) 1.25 MG (50000 UNIT) CAPS capsule    Sig: 1 po q wed and 1 po q sun    Dispense:  8 capsule    Refill:  0    30 d supply;  ** OV for RF **   Do not send RF request   metFORMIN (GLUCOPHAGE) 500 MG tablet    Sig: Take 1 tablet (500 mg total) by mouth 3 (three) times daily.    Dispense:  90 tablet    Refill:  0    30 d supply;  ** OV for RF **   Do not send RF request     1. Pre-diabetes Alexis Christensen will continue to work on weight loss, exercise, and decreasing simple carbohydrates to help decrease the risk of diabetes.   Refill - metFORMIN (GLUCOPHAGE) 500 MG tablet; Take 1 tablet (500 mg total) by mouth 3 (three) times daily.  Dispense: 90 tablet; Refill: 0  2. Vitamin D deficiency Low Vitamin D level contributes to fatigue and are associated with obesity, breast, and colon cancer. She agrees to continue to take prescription Vitamin D '@50'$ ,000 IU every week and will follow-up for routine testing of Vitamin D, at least 2-3 times per year to avoid over-replacement.  Refill - Vitamin D, Ergocalciferol, (DRISDOL) 1.25 MG (50000 UNIT) CAPS capsule; 1 po q wed and 1 po q sun  Dispense: 8 capsule; Refill: 0  3. Mood disorder ([place]) Continue medications per psych  doctor and start counseling, exercise,PNP and get adequate sleep.   4. BMI 37.0-37.9, adult-current bmi 37.6  5. Morbid obesity (HCC)-start bmi 39.31 Stress management ideas discussed with patient.  Follow up with a counselor and psych team.   Alexis Christensen is currently in the action stage of change. As such, her goal is to continue with weight loss efforts. She has agreed to the Category 1 Plan and keeping a food journal and adhering to recommended goals of 1000-1100 calories and 80+ protein daily..   Exercise goals:  As is.  Start walking for stress management.  Behavioral modification strategies: meal planning and cooking strategies,  keeping healthy foods in the home, and planning for success.  Alexis Christensen has agreed to follow-up with our clinic in 2 weeks. She was informed of the importance of frequent follow-up visits to maximize her success with intensive lifestyle modifications for her multiple health conditions.   Objective:   Blood pressure 120/87, pulse 63, temperature 98.2 F (36.8 C), height '5\' 4"'$  (1.626 m), weight 219 lb (99.3 kg), SpO2 99 %. Body mass index is 37.59 kg/m.  General: Cooperative, alert, well developed, in no acute distress. HEENT: Conjunctivae and lids unremarkable. Cardiovascular: Regular rhythm.  Lungs: Normal work of breathing. Neurologic: No focal deficits.   Lab Results  Component Value Date   CREATININE 0.6 06/13/2022   BUN 12 06/13/2022   NA 144 06/13/2022   K 4.4 06/13/2022   CL 105 06/13/2022   CO2 26 (A) 06/13/2022   Lab Results  Component Value Date   ALT 14 06/13/2022   AST 15 06/13/2022   ALKPHOS 115 06/13/2022   BILITOT 0.5 02/14/2022   Lab Results  Component Value Date   HGBA1C 6.0 06/13/2022   HGBA1C 5.9 02/01/2022   HGBA1C 5.7 06/20/2021   HGBA1C 6.0 03/06/2021   Lab Results  Component Value Date   INSULIN 13.8 02/14/2022   INSULIN 6.4 08/01/2021   INSULIN 7.4 03/28/2021   Lab Results  Component Value Date   TSH 13.20 (A) 06/13/2022   Lab Results  Component Value Date   CHOL 179 06/13/2022   HDL 56 06/13/2022   LDLCALC 103 06/13/2022   TRIG 112 06/13/2022   Lab Results  Component Value Date   VD25OH 16.2 (L) 07/03/2022   VD25OH 19.6 (L) 02/14/2022   VD25OH 25.7 (L) 08/01/2021   Lab Results  Component Value Date   WBC 5.8 06/13/2022   HGB 11.7 (A) 06/13/2022   HCT 35 (A) 06/13/2022   MCV 89.2 02/25/2022   PLT 323 06/13/2022   Lab Results  Component Value Date   IRON 39 07/13/2016   TIBC 408 07/13/2016   FERRITIN 11 07/13/2016   Attestation Statements:   Reviewed by clinician on day of visit: allergies, medications, problem list,  medical history, surgical history, family history, social history, and previous encounter notes.  Time spent on visit including pre-visit chart review and post-visit care and charting was 40 minutes.   I, Davy Pique, RMA, am acting as Location manager for [employer], DO.   I have reviewed the above documentation for accuracy and completeness, and I agree with the above. Marjory Sneddon, D.O.  The [place] was signed into law in 2016 which includes the topic of electronic health records.  This provides immediate access to information in MyChart.  This includes consultation notes, operative notes, office notes, lab results and pathology reports.  If you have any questions about what you read please let us know  at your next visit so we can discuss your concerns and take corrective action if need be.  We are right here with you.

## 2022-09-11 ENCOUNTER — Encounter (INDEPENDENT_AMBULATORY_CARE_PROVIDER_SITE_OTHER): Payer: Self-pay | Admitting: Family Medicine

## 2022-09-11 ENCOUNTER — Ambulatory Visit (INDEPENDENT_AMBULATORY_CARE_PROVIDER_SITE_OTHER): Payer: 59 | Admitting: Family Medicine

## 2022-09-11 VITALS — BP 101/69 | HR 67 | Temp 97.8°F | Ht 64.0 in | Wt 220.0 lb

## 2022-09-11 DIAGNOSIS — E559 Vitamin D deficiency, unspecified: Secondary | ICD-10-CM | POA: Diagnosis not present

## 2022-09-11 DIAGNOSIS — R7303 Prediabetes: Secondary | ICD-10-CM | POA: Diagnosis not present

## 2022-09-11 DIAGNOSIS — E538 Deficiency of other specified B group vitamins: Secondary | ICD-10-CM

## 2022-09-11 DIAGNOSIS — E038 Other specified hypothyroidism: Secondary | ICD-10-CM | POA: Diagnosis not present

## 2022-09-11 DIAGNOSIS — Z6837 Body mass index (BMI) 37.0-37.9, adult: Secondary | ICD-10-CM

## 2022-09-11 NOTE — Assessment & Plan Note (Signed)
Her last TSH was high at 7.896  08/28/22 Cytomel 5 mcg was added to levothyroxine 175 mcg daily Energy level has improved  F/u with endocrinology to recheck levels

## 2022-09-11 NOTE — Assessment & Plan Note (Signed)
Lab Results  Component Value Date   HGBA1C 6.0 06/13/2022    Doing well on metformin 500 mg tid with food Working on reducing intake of sugar and starches Chronic pain has limited her ability to exercise  Will begin Rybelsus 3 mg once daily and reduce metformin to 500 mg bid with food. Patient denies a personal or family history of pancreatitis, medullary thyroid carcinoma or multiple endocrine neoplasia type II.

## 2022-09-11 NOTE — Assessment & Plan Note (Signed)
Doing well on RX vitamin D 50,000 IU 2 x a week Energy level is low  Continue RX vitamin D 50,000 IU 2 x a week Last vitamin D Lab Results  Component Value Date   VD25OH 16.2 (L) 07/03/2022   Recheck level in 2 mos

## 2022-09-11 NOTE — Progress Notes (Signed)
Office: [phone]  /  Fax: [phone]  WEIGHT SUMMARY AND BIOMETRICS  Vitals Temp: 97.8 F (36.6 C) BP: 101/69 Pulse Rate: 67 SpO2: 98 %   Anthropometric Measurements Height: '5\' 4"'$  (1.626 m) Weight: 220 lb (99.8 kg) BMI (Calculated): 37.74 Weight at Last Visit: 219lb Weight Lost Since Last Visit: +1 Starting Weight: 229lb Total Weight Loss (lbs): 9 lb (4.082 kg)   Body Composition  Body Fat %: 48.8 % Fat Mass (lbs): 107.8 lbs Muscle Mass (lbs): 107.2 lbs Total Body Water (lbs): 81.6 lbs Visceral Fat Rating : 14   Other Clinical Data Fasting: no Labs: no Today's Visit #: 21 Starting Date: 03/28/21     HPI  Chief Complaint: OBESITY  Alexis Christensen is here to discuss her progress with her obesity treatment plan. She is on the the Category 1 Plan and states she is following her eating plan approximately 50 % of the time. She states she is not exercising.    Interval History:  Since last office visit she is up 1 lb She is trying to log her food intake She works [place].  2 yo granddaughter has been staying with her (temporarily) Stands mostly while working at [employer] passed in January and her weight is up 14 lb since October She has a good support system She is seeing a therapist to work on grief reaction She has a Building services engineer but has not been going Neuropathy limits walking Eating fast food frequently  Pharmacotherapy: none  PHYSICAL EXAM:  Blood pressure 101/69, pulse 67, temperature 97.8 F (36.6 C), height '5\' 4"'$  (1.626 m), weight 220 lb (99.8 kg), SpO2 98 %. Body mass index is 37.76 kg/m.  General: She is overweight, cooperative, alert, well developed, and in no acute distress. PSYCH: Has normal mood, affect and thought process.   Lungs: Normal breathing effort, no conversational dyspnea.  DIAGNOSTIC DATA REVIEWED:  BMET    Component Value Date/Time   NA 144 06/13/2022 0000   NA 138 11/07/2012 0517   K 4.4 06/13/2022 0000   K  3.9 11/07/2012 0517   CL 105 06/13/2022 0000   CL 109 (H) 11/07/2012 0517   CO2 26 (A) 06/13/2022 0000   CO2 25 11/07/2012 0517   GLUCOSE 91 02/25/2022 1435   GLUCOSE 91 11/07/2012 0517   BUN 12 06/13/2022 0000   BUN 7 11/07/2012 0517   CREATININE 0.6 06/13/2022 0000   CREATININE 0.69 02/25/2022 1435   CREATININE 0.76 11/07/2012 0517   CALCIUM 8.9 06/13/2022 0000   CALCIUM 7.9 (L) 11/07/2012 0517   GFRNONAA >60 02/25/2022 1435   GFRNONAA >60 11/07/2012 0517   GFRAA >60 02/17/2020 0000   GFRAA >60 11/07/2012 0517   Lab Results  Component Value Date   HGBA1C 6.0 06/13/2022   HGBA1C 6.0 03/06/2021   Lab Results  Component Value Date   INSULIN 13.8 02/14/2022   INSULIN 7.4 03/28/2021   Lab Results  Component Value Date   TSH 13.20 (A) 06/13/2022   CBC    Component Value Date/Time   WBC 5.8 06/13/2022 0000   WBC 6.2 02/25/2022 1435   RBC 3.90 06/13/2022 0000   HGB 11.7 (A) 06/13/2022 0000   HGB 11.2 (L) 11/07/2012 0517   HCT 35 (A) 06/13/2022 0000   HCT 36.7 10/20/2012 1448   PLT 323 06/13/2022 0000   PLT 209 11/07/2012 0517   MCV 89.2 02/25/2022 1435   MCV 89 10/20/2012 1448   MCH 28.5 02/25/2022 1435   MCHC 32.0  02/25/2022 1435   RDW 12.7 02/25/2022 1435   RDW 13.0 10/20/2012 1448   Iron Studies    Component Value Date/Time   IRON 39 07/13/2016 1055   TIBC 408 07/13/2016 1055   FERRITIN 11 07/13/2016 1055   IRONPCTSAT 10 (L) 07/13/2016 1055   Lipid Panel     Component Value Date/Time   CHOL 179 06/13/2022 0000   TRIG 112 06/13/2022 0000   HDL 56 06/13/2022 0000   LDLCALC 103 06/13/2022 0000   Hepatic Function Panel     Component Value Date/Time   PROT 6.3 02/14/2022 1234   ALBUMIN 3.8 06/13/2022 0000   ALBUMIN 3.9 02/14/2022 1234   AST 15 06/13/2022 0000   ALT 14 06/13/2022 0000   ALKPHOS 115 06/13/2022 0000   BILITOT 0.5 02/14/2022 1234      Component Value Date/Time   TSH 13.20 (A) 06/13/2022 0000   TSH 22.424 (H) 01/12/2021 2023   TSH  11.785 (H) 06/14/2011 2350   Nutritional Lab Results  Component Value Date   VD25OH 16.2 (L) 07/03/2022   VD25OH 19.6 (L) 02/14/2022   VD25OH 25.7 (L) 08/01/2021     ASSESSMENT AND PLAN  TREATMENT PLAN FOR OBESITY:  Recommended Dietary Goals  Alexis Christensen is currently in the action stage of change. As such, her goal is to continue weight management plan. She has agreed to keeping a food journal and adhering to recommended goals of 1100 calories and 110 protein.  Behavioral Intervention  We discussed the following Behavioral Modification Strategies today: increasing lean protein intake, increasing vegetables, avoiding skipping meals, increasing water intake, work on meal planning and easy cooking plans, decreasing eating out, consumption of processed foods, reading food labels and making healthy choices when eating convenient foods, work on managing stress, creating time for self-care and relaxation measures, and avoiding temptations and identifying enticing environmental cues.  Additional resources provided today: NA  Recommended Physical Activity Goals  Alexis Christensen has been advised to work up to 150 minutes of moderate intensity aerobic activity a week and strengthening exercises 2-3 times per week for cardiovascular health, weight loss maintenance and preservation of muscle mass.   She has agreed to increase physical activity in their day and reduce sedentary time (increase NEAT).    Pharmacotherapy We discussed various medication options to help Alexis Christensen with her weight loss efforts and we both agreed to will work on a [place] for Rybelsus.  ASSOCIATED CONDITIONS ADDRESSED TODAY  Pre-diabetes Assessment & Plan: Lab Results  Component Value Date   HGBA1C 6.0 06/13/2022    Doing well on metformin 500 mg tid with food Working on reducing intake of sugar and starches Chronic pain has limited her ability to exercise  Will begin Rybelsus 3 mg once daily and reduce metformin to 500 mg bid with  food. Patient denies a personal or family history of pancreatitis, medullary thyroid carcinoma or multiple endocrine neoplasia type II.     Obesity,current BMI 37.9  Morbid obesity (HCC)  Vitamin D deficiency Assessment & Plan: Doing well on RX vitamin D 50,000 IU 2 x a week Energy level is low  Continue RX vitamin D 50,000 IU 2 x a week Last vitamin D Lab Results  Component Value Date   VD25OH 16.2 (L) 07/03/2022   Recheck level in 2 mos   B12 deficiency Assessment & Plan: Taking B12 500 mcg daily  Energy level is low Denies paresthesias  Continue B12 500 mcg daily   Other specified hypothyroidism Assessment & Plan: Her last  TSH was high at 7.896  08/28/22 Cytomel 5 mcg was added to levothyroxine 175 mcg daily Energy level has improved  F/u with endocrinology to recheck levels        No follow-ups on file.Marland Kitchen She was informed of the importance of frequent follow up visits to maximize her success with intensive lifestyle modifications for her multiple health conditions.   ATTESTASTION STATEMENTS:  Reviewed by clinician on day of visit: allergies, medications, problem list, medical history, surgical history, family history, social history, and previous encounter notes.   I have personally spent 30 minutes total time today in preparation, patient care, nutritional counseling and documentation for this visit, including the following: review of clinical lab tests; review of medical tests/procedures/services.      Dell Ponto, DO

## 2022-09-11 NOTE — Assessment & Plan Note (Signed)
Taking B12 500 mcg daily  Energy level is low Denies paresthesias  Continue B12 500 mcg daily

## 2022-09-12 ENCOUNTER — Telehealth (INDEPENDENT_AMBULATORY_CARE_PROVIDER_SITE_OTHER): Payer: Self-pay | Admitting: *Deleted

## 2022-09-12 ENCOUNTER — Encounter (INDEPENDENT_AMBULATORY_CARE_PROVIDER_SITE_OTHER): Payer: Self-pay | Admitting: *Deleted

## 2022-09-12 DIAGNOSIS — M25561 Pain in right knee: Secondary | ICD-10-CM | POA: Diagnosis not present

## 2022-09-12 DIAGNOSIS — R0609 Other forms of dyspnea: Secondary | ICD-10-CM | POA: Diagnosis not present

## 2022-09-12 DIAGNOSIS — J439 Emphysema, unspecified: Secondary | ICD-10-CM | POA: Diagnosis not present

## 2022-09-12 DIAGNOSIS — G479 Sleep disorder, unspecified: Secondary | ICD-10-CM | POA: Diagnosis not present

## 2022-09-12 NOTE — Telephone Encounter (Signed)
Prior authorization done via cover my meds for patients Rybelsus. Waiting on determination.  Alexis Christensen (Key: [id number]) Rybelsus '3MG'$  tablets

## 2022-09-13 NOTE — Telephone Encounter (Signed)
Message from [place] patients insurance, no Prior authorization needed at this time for her Rybelsus.

## 2022-09-16 MED ORDER — RYBELSUS 3 MG PO TABS: 3.0000 mg | ORAL_TABLET | Freq: Every day | ORAL | 0 refills | Status: DC

## 2022-09-16 NOTE — Addendum Note (Signed)
Addended by: Dell Ponto on: 09/16/2022 05:12 PM   Modules accepted: Orders

## 2022-09-17 ENCOUNTER — Encounter: Payer: Self-pay | Admitting: Urology

## 2022-09-17 ENCOUNTER — Ambulatory Visit (INDEPENDENT_AMBULATORY_CARE_PROVIDER_SITE_OTHER): Payer: 59 | Admitting: Urology

## 2022-09-17 VITALS — BP 111/77 | HR 70 | Ht 64.0 in | Wt 227.0 lb

## 2022-09-17 DIAGNOSIS — N3946 Mixed incontinence: Secondary | ICD-10-CM | POA: Diagnosis not present

## 2022-09-17 DIAGNOSIS — R350 Frequency of micturition: Secondary | ICD-10-CM | POA: Diagnosis not present

## 2022-09-17 DIAGNOSIS — N3020 Other chronic cystitis without hematuria: Secondary | ICD-10-CM

## 2022-09-17 DIAGNOSIS — Z87440 Personal history of urinary (tract) infections: Secondary | ICD-10-CM | POA: Diagnosis not present

## 2022-09-17 LAB — MICROSCOPIC EXAMINATION: WBC, UA: >30 /hpf — AB (ref 0–5)

## 2022-09-17 LAB — URINALYSIS, COMPLETE
Bilirubin, UA: NEGATIVE
Glucose, UA: NEGATIVE
Ketones, UA: NEGATIVE
Nitrite, UA: NEGATIVE
Protein,UA: NEGATIVE
Specific Gravity, UA: 1.025 (ref 1.005–1.030)
Urobilinogen, Ur: 0.2 mg/dL (ref 0.2–1.0)
pH, UA: 5.5 (ref 5.0–7.5)

## 2022-09-17 MED ORDER — CIPROFLOXACIN HCL 250 MG PO TABS: 250.0000 mg | ORAL_TABLET | Freq: Two times a day (BID) | ORAL | 0 refills | Status: AC

## 2022-09-17 NOTE — Progress Notes (Signed)
09/17/2022 8:19 AM   Alexis Christensen 1970/08/22 [id number]  Referring provider: Perrin Maltese, MD [address] [place],  [place] [address]  No chief complaint on file.   HPI: The patient had InterStim on 11/26/2016 for persistent overactive bladder and urgency incontinence after sling. The patient no longer has stress incontinence and had failed multiple medications. The need for the InterStim was discussed pre-operatively.   She is continent with intermittent urgency. She used to have mild bedwetting and does not have it either. Frequency also improved.    She stays quite dry during the day.  She gets up a few times at night.  If she holds it too long she can has some foot on the floor syndrome but the device appears to be working very well   Almost completely continent and very pleased.  N   Patient has not been seen for a few years.   And was doing great until 2 months ago.  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  High-volume leakage.  No cystitis symptoms.  She has never changed the program or amplitude.   Patient has a new onset urge incontinence.  During troubleshooting that was only 1 program.  Impedance was normal.  It was turned up amplitude and she felt it in the vagina.  Judson Roch will speak to the representative and start up programming exercise and look into its data   Today Frequency stable.  When amplitude was increased patient did well.  She was not leaking.  Patient is completely continent and very pleased.  No infections.  Has not changed the amplitude or any settings.   I will see the patient as needed. Battery life discussed      Today Patient says she is getting approximately 2 bladder infections month with burning cramping and frequency that respond favorably to antibiotics.  InterStim still works really well.  She thinks she may be infected today.  I did not see recent urine cultures.  She had a CT scan last year that was normal.  She has a number  of allergies  I thought it was best to send in ciprofloxacin 250 mg twice a day for 7 days. Pathophysiology of UTIs discussed. Reassess on Macrodantin 100 mg 30 x 11 in 8 weeks   Today Frequency stable.  Last culture negative. 2 days ago patient was having vague abdominal pain and midline low back pain.  Frequency is stable.  No burning.  No foul-smelling urine.  He was doing well on daily Macrodantin up until this point        PMH: Past Medical History:  Diagnosis Date   Acute diastolic (congestive) heart failure (HCC)    ADHD    Adopted    Anemia    Anxiety    Arthritis    Asthma    B12 deficiency    Bilateral swelling of feet and ankles    Bipolar 1 disorder (HCC)    Bipolar disorder ([place])    Carpal tunnel syndrome 09/15/2014   Chest pain    CHF (congestive heart failure) ([place])    Dialostic CHF   Chronic diarrhea 09/07/2015   Chronic kidney disease    H/O KIDNEY STONES   Chronic venous insufficiency 04/26/2016   Depression    DVT (deep venous thrombosis) ([place]) 2016   RIGHT LEG   Edema leg    Effusion of knee 12/30/2013   Family history of adverse reaction to anesthesia    ADOPTED  Food allergy    GERD (gastroesophageal reflux disease)    Goiter    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   Headache(784.0)    MIGRAINES   Heart burn    Heart murmur    History of blood clots    History of kidney stones    HLD (hyperlipidemia)    Hx MRSA infection    Hyperlipidemia    Hypothyroidism    Lipoma of arm 05/11/2013   Lower extremity edema    Lymphedema 04/26/2016   Other fatigue    Post traumatic stress disorder (PTSD)    Prediabetes    PVD (peripheral vascular disease) (HCC)    Seasonal allergies    Shortness of breath    Shortness of breath on exertion    Stress incontinence    Thyroid disease    Urge incontinence    Vitamin D deficiency     Surgical History: Past Surgical History:  Procedure Laterality Date   ABDOMINAL HYSTERECTOMY     ANKLE  SURGERY     CHOLECYSTECTOMY     COLONOSCOPY WITH PROPOFOL N/A 06/24/2019   Procedure: COLONOSCOPY WITH PROPOFOL;  Surgeon: Toledo, Benay Pike, MD;  Location: [facility];  Service: Gastroenterology;  Laterality: N/A;   CYSTOSCOPY N/A 04/09/2016   Procedure: CYSTOSCOPY;  Surgeon: Bjorn Loser, MD;  Location: [facility];  Service: Urology;  Laterality: N/A;   DILATION AND CURETTAGE OF UTERUS     ESOPHAGOGASTRODUODENOSCOPY (EGD) WITH PROPOFOL N/A 06/24/2019   Procedure: ESOPHAGOGASTRODUODENOSCOPY (EGD) WITH PROPOFOL;  Surgeon: Toledo, Benay Pike, MD;  Location: [facility];  Service: Gastroenterology;  Laterality: N/A;   INGUINAL HERNIA REPAIR Bilateral 04/18/2017   Procedure: LAPAROSCOPIC BILATERAL INGUINAL HERNIA REPAIR;  Surgeon: Jules Husbands, MD;  Location: [facility];  Service: General;  Laterality: Bilateral;   INTERSTIM IMPLANT PLACEMENT N/A 11/26/2016   Procedure: Barrie Lyme IMPLANT FIRST STAGE;  Surgeon: Bjorn Loser, MD;  Location: [facility];  Service: Urology;  Laterality: N/A;   INTERSTIM IMPLANT PLACEMENT N/A 11/26/2016   Procedure: Barrie Lyme IMPLANT SECOND STAGE;  Surgeon: Bjorn Loser, MD;  Location: [facility];  Service: Urology;  Laterality: N/A;   JOINT REPLACEMENT Right    knee   KNEE ARTHROSCOPY Bilateral    KNEE ARTHROSCOPY WITH LATERAL RELEASE Left 10/18/2015   Procedure: KNEE ARTHROSCOPY LATERAL AND PARTIAL SYNOVECTOMY;  Surgeon: Hessie Knows, MD;  Location: [facility];  Service: Orthopedics;  Laterality: Left;   Lymph Node removal  2015   Neck   PUBOVAGINAL SLING N/A 04/09/2016   Procedure: PUBO-VAGINAL SLING/ RETROPUBIC SLING;  Surgeon: Bjorn Loser, MD;  Location: [facility];  Service: Urology;  Laterality: N/A;   REPLACEMENT TOTAL KNEE Right    SHOULDER SURGERY Right 2014   TUBAL LIGATION     WRIST SURGERY Right    metal plate    Home Medications:  Allergies as of 09/17/2022       Reactions   Geodon [ziprasidone Hydrochloride] Other (See Comments)    Numbness ,sob, headaches, blurred vision   Sulfacetamide Sodium Hives   Ziprasidone Anaphylaxis   Other reaction(s): Other (See Comments) Numbness ,sob, headaches, blurred vision Other reaction(s): Other (See Comments), Unknown Numbness ,sob, headaches, blurred vision Numbness ,sob, headaches, blurred vision Other reaction(s): Other (See Comments), Unknown Numbness ,sob, headaches, blurred vision Numbness ,sob, headaches, blurred vision Numbness ,sob, headaches, blurred vision Other reaction(s): Other (See Comments), Unknown Numbness ,sob, headaches, blurred vision Numbness ,sob, headaches, blurred vision Other reaction(s): Other (See Comments), Unknown Numbness ,sob, headaches, blurred vision Numbness ,sob, headaches,  blurred vision   Ziprasidone Hcl Anaphylaxis, Other (See Comments)   Other reaction(s): Other (See Comments), Unknown Numbness ,sob, headaches, blurred vision Numbness ,sob, headaches, blurred vision   Ace Inhibitors Rash   Erythromycin Rash, Hives   Hydromorphone Rash   Lamictal [lamotrigine] Rash   Other reaction(s): Unknown   Strawberry (diagnostic) Rash   Sulfa Antibiotics Hives, Rash   Other reaction(s): Unknown Other reaction(s): Unknown Other reaction(s): Unknown        Medication List        Accurate as of September 17, 2022  8:19 AM. If you have any questions, ask your nurse or doctor.          ARIPiprazole 20 MG tablet Commonly known as: ABILIFY Take 20 mg by mouth at bedtime as needed.   aspirin 81 MG chewable tablet Aspirin 81 MG Oral Tablet Chewable QTY: 30 tablet Days: 30 Refills: 0  Written: 01/23/21 Patient Instructions: once a day   celecoxib 100 MG capsule Commonly known as: CELEBREX Take by mouth.   colestipol 1 g tablet Commonly known as: COLESTID Take 2 g by mouth 2 (two) times daily.   cyanocobalamin 500 MCG tablet Commonly known as: VITAMIN B12 Take 1 tablet (500 mcg total) by mouth daily.   dicyclomine 10 MG  capsule Commonly known as: BENTYL Take 10 mg by mouth every 8 (eight) hours as needed.   DULoxetine 60 MG capsule Commonly known as: CYMBALTA Take 60 mg by mouth 2 (two) times daily.   furosemide 20 MG tablet Commonly known as: LASIX Take 40 mg by mouth daily.   gabapentin 600 MG tablet Commonly known as: NEURONTIN Take 600 mg by mouth 2 (two) times daily.   hydrOXYzine 25 MG tablet Commonly known as: ATARAX Take 50 mg by mouth every 8 (eight) hours as needed for itching.   Klor-Con M10 10 MEQ tablet Generic drug: potassium chloride Take 10 mEq by mouth daily.   levothyroxine 175 MCG tablet Commonly known as: SYNTHROID Take 175 mcg by mouth daily before breakfast.   liothyronine 5 MCG tablet Commonly known as: CYTOMEL Take 5 mcg by mouth daily.   lisinopril 5 MG tablet Commonly known as: ZESTRIL Take 5 mg by mouth daily.   metFORMIN 500 MG tablet Commonly known as: GLUCOPHAGE Take 1 tablet (500 mg total) by mouth 3 (three) times daily.   nitrofurantoin 100 MG capsule Commonly known as: Macrodantin Take 1 capsule (100 mg total) by mouth daily at 6 (six) AM.   nortriptyline 10 MG capsule Commonly known as: PAMELOR Take 10 mg in the morning   pantoprazole 20 MG tablet Commonly known as: PROTONIX Take 20 mg by mouth daily.   pregabalin 50 MG capsule Commonly known as: LYRICA Take 50 mg by mouth 2 (two) times daily.   Repatha SureClick [id number] MG/ML [place] Generic drug: Evolocumab Inject into the skin.   rosuvastatin 40 MG tablet Commonly known as: CRESTOR Take 40 mg by mouth daily.   Rybelsus 3 MG Tabs Generic drug: Semaglutide Take 1 tablet (3 mg total) by mouth daily.   spironolactone 25 MG tablet Commonly known as: ALDACTONE TAKE 1/2 (HALF) TABLET BY MOUTH ONCE DAILY.   Suboxone 8-2 MG Film Generic drug: Buprenorphine HCl-Naloxone HCl Dissolve 1 film under tongue twice daily   Vitamin D (Ergocalciferol) 1.25 MG (50000 UNIT) Caps capsule Commonly  known as: DRISDOL 1 po q wed and 1 po q sun        Allergies:  Allergies  Allergen Reactions  Geodon [Ziprasidone Hydrochloride] Other (See Comments)    Numbness ,sob, headaches, blurred vision   Sulfacetamide Sodium Hives   Ziprasidone Anaphylaxis    Other reaction(s): Other (See Comments) Numbness ,sob, headaches, blurred vision  Other reaction(s): Other (See Comments), Unknown Numbness ,sob, headaches, blurred vision Numbness ,sob, headaches, blurred vision  Other reaction(s): Other (See Comments), Unknown Numbness ,sob, headaches, blurred vision Numbness ,sob, headaches, blurred vision   Numbness ,sob, headaches, blurred vision Other reaction(s): Other (See Comments), Unknown Numbness ,sob, headaches, blurred vision Numbness ,sob, headaches, blurred vision Other reaction(s): Other (See Comments), Unknown Numbness ,sob, headaches, blurred vision Numbness ,sob, headaches, blurred vision   Ziprasidone Hcl Anaphylaxis and Other (See Comments)    Other reaction(s): Other (See Comments), Unknown Numbness ,sob, headaches, blurred vision Numbness ,sob, headaches, blurred vision   Ace Inhibitors Rash   Erythromycin Rash and Hives   Hydromorphone Rash   Lamictal [Lamotrigine] Rash    Other reaction(s): Unknown   Strawberry (Diagnostic) Rash   Sulfa Antibiotics Hives and Rash    Other reaction(s): Unknown Other reaction(s): Unknown Other reaction(s): Unknown    Family History: Family History  Adopted: Yes  Family history unknown: Yes    Social History:  reports that she quit smoking about 3 years ago. Her smoking use included cigarettes. She started smoking about 14 years ago. She has a 5.00 pack-year smoking history. She has quit using smokeless tobacco. She reports that she does not currently use alcohol. She reports current drug use. Drug: Other-see comments.  ROS:                                          Laboratory Data: Lab Results   Component Value Date   WBC 5.8 06/13/2022   HGB 11.7 (A) 06/13/2022   HCT 35 (A) 06/13/2022   MCV 89.2 02/25/2022   PLT 323 06/13/2022    Lab Results  Component Value Date   CREATININE 0.6 06/13/2022    No results found for: "PSA"  No results found for: "TESTOSTERONE"  Lab Results  Component Value Date   HGBA1C 6.0 06/13/2022    Urinalysis    Component Value Date/Time   COLORURINE YELLOW (A) 02/25/2022 1435   APPEARANCEUR Clear 07/02/2022 1517   LABSPEC 1.029 02/25/2022 1435   PHURINE 5.0 02/25/2022 1435   GLUCOSEU Negative 07/02/2022 1517   HGBUR NEGATIVE 02/25/2022 1435   BILIRUBINUR Negative 07/02/2022 1517   KETONESUR NEGATIVE 02/25/2022 1435   PROTEINUR Negative 07/02/2022 1517   PROTEINUR 30 (A) 02/25/2022 1435   UROBILINOGEN 1.0 06/18/2011 1258   NITRITE Negative 07/02/2022 1517   NITRITE NEGATIVE 02/25/2022 1435   LEUKOCYTESUR 2+ (A) 07/02/2022 1517   LEUKOCYTESUR TRACE (A) 02/25/2022 1435    Pertinent Imaging:   Assessment & Plan: The urine did look positive but the symptoms were nonspecific.  Because she is symptomatic I called in ciprofloxacin 250 mg twice a day for 7 days.  She will see nurse practitioner in 3 weeks and if she did have a breakthrough infection she should be switched to either daily Keflex or perhaps trimethoprim.  She gets hives with sulfa drugs.  If culture is negative stay on Macrodantin.  She will bring her device and she also wants her battery life checked the InterStim  There are no diagnoses linked to this encounter.  No follow-ups on file.  Reece Packer, MD  [facility] [address], [place], [place] [address] [phone]

## 2022-09-19 LAB — CULTURE, URINE COMPREHENSIVE

## 2022-09-21 DIAGNOSIS — M25561 Pain in right knee: Secondary | ICD-10-CM | POA: Diagnosis not present

## 2022-09-25 ENCOUNTER — Ambulatory Visit (INDEPENDENT_AMBULATORY_CARE_PROVIDER_SITE_OTHER): Payer: 59 | Admitting: Family Medicine

## 2022-09-25 ENCOUNTER — Telehealth: Payer: Self-pay

## 2022-09-26 ENCOUNTER — Telehealth (INDEPENDENT_AMBULATORY_CARE_PROVIDER_SITE_OTHER): Payer: Medicare Other | Admitting: Family Medicine

## 2022-09-26 DIAGNOSIS — R29818 Other symptoms and signs involving the nervous system: Secondary | ICD-10-CM | POA: Diagnosis not present

## 2022-09-26 DIAGNOSIS — R197 Diarrhea, unspecified: Secondary | ICD-10-CM | POA: Diagnosis not present

## 2022-09-26 DIAGNOSIS — Z20822 Contact with and (suspected) exposure to covid-19: Secondary | ICD-10-CM | POA: Diagnosis not present

## 2022-09-26 DIAGNOSIS — G2581 Restless legs syndrome: Secondary | ICD-10-CM | POA: Diagnosis not present

## 2022-09-26 DIAGNOSIS — R0602 Shortness of breath: Secondary | ICD-10-CM | POA: Diagnosis not present

## 2022-09-26 DIAGNOSIS — Z87891 Personal history of nicotine dependence: Secondary | ICD-10-CM | POA: Diagnosis not present

## 2022-09-26 DIAGNOSIS — R0600 Dyspnea, unspecified: Secondary | ICD-10-CM | POA: Diagnosis not present

## 2022-09-26 DIAGNOSIS — R059 Cough, unspecified: Secondary | ICD-10-CM | POA: Diagnosis not present

## 2022-09-26 DIAGNOSIS — R6883 Chills (without fever): Secondary | ICD-10-CM | POA: Diagnosis not present

## 2022-09-26 DIAGNOSIS — R200 Anesthesia of skin: Secondary | ICD-10-CM | POA: Diagnosis not present

## 2022-09-26 DIAGNOSIS — R5381 Other malaise: Secondary | ICD-10-CM | POA: Diagnosis not present

## 2022-09-26 DIAGNOSIS — R110 Nausea: Secondary | ICD-10-CM | POA: Diagnosis not present

## 2022-09-26 DIAGNOSIS — E785 Hyperlipidemia, unspecified: Secondary | ICD-10-CM | POA: Diagnosis not present

## 2022-09-26 DIAGNOSIS — Z880 Allergy status to penicillin: Secondary | ICD-10-CM | POA: Diagnosis not present

## 2022-09-26 DIAGNOSIS — Z885 Allergy status to narcotic agent status: Secondary | ICD-10-CM | POA: Diagnosis not present

## 2022-09-26 DIAGNOSIS — R202 Paresthesia of skin: Secondary | ICD-10-CM | POA: Diagnosis not present

## 2022-09-26 NOTE — Telephone Encounter (Signed)
Patient informed [facility] front desk she needs to make sure she does an at home test befroe bringing her into the office sick

## 2022-09-27 ENCOUNTER — Ambulatory Visit: Payer: 59 | Admitting: Nurse Practitioner

## 2022-10-01 ENCOUNTER — Encounter: Payer: Self-pay | Admitting: Oncology

## 2022-10-01 ENCOUNTER — Ambulatory Visit
Admission: RE | Admit: 2022-10-01 | Discharge: 2022-10-01 | Disposition: A | Discharge status: Home / Self Care | Payer: 59 | Source: Ambulatory Visit | Attending: Family | Admitting: Family

## 2022-10-01 ENCOUNTER — Ambulatory Visit
Admission: RE | Admit: 2022-10-01 | Discharge: 2022-10-01 | Disposition: A | Discharge status: Home / Self Care | Payer: 59 | Attending: Family | Admitting: Family

## 2022-10-01 ENCOUNTER — Encounter: Payer: Self-pay | Admitting: Family

## 2022-10-01 ENCOUNTER — Ambulatory Visit (INDEPENDENT_AMBULATORY_CARE_PROVIDER_SITE_OTHER): Payer: 59 | Admitting: Family

## 2022-10-01 ENCOUNTER — Ambulatory Visit (INDEPENDENT_AMBULATORY_CARE_PROVIDER_SITE_OTHER): Payer: 59 | Admitting: Physician Assistant

## 2022-10-01 VITALS — BP 114/79 | HR 83 | Ht 64.0 in | Wt 220.0 lb

## 2022-10-01 VITALS — BP 110/74 | HR 85 | Ht 64.0 in | Wt 228.8 lb

## 2022-10-01 DIAGNOSIS — J101 Influenza due to other identified influenza virus with other respiratory manifestations: Secondary | ICD-10-CM | POA: Diagnosis not present

## 2022-10-01 DIAGNOSIS — R102 Pelvic and perineal pain: Secondary | ICD-10-CM

## 2022-10-01 DIAGNOSIS — R0602 Shortness of breath: Secondary | ICD-10-CM | POA: Diagnosis not present

## 2022-10-01 DIAGNOSIS — R0600 Dyspnea, unspecified: Secondary | ICD-10-CM

## 2022-10-01 DIAGNOSIS — R0609 Other forms of dyspnea: Secondary | ICD-10-CM | POA: Diagnosis not present

## 2022-10-01 DIAGNOSIS — Z86718 Personal history of other venous thrombosis and embolism: Secondary | ICD-10-CM | POA: Diagnosis not present

## 2022-10-01 DIAGNOSIS — K458 Other specified abdominal hernia without obstruction or gangrene: Secondary | ICD-10-CM | POA: Diagnosis not present

## 2022-10-01 DIAGNOSIS — N3946 Mixed incontinence: Secondary | ICD-10-CM

## 2022-10-01 DIAGNOSIS — R0781 Pleurodynia: Secondary | ICD-10-CM | POA: Diagnosis not present

## 2022-10-01 DIAGNOSIS — R52 Pain, unspecified: Secondary | ICD-10-CM | POA: Diagnosis not present

## 2022-10-01 DIAGNOSIS — Z01818 Encounter for other preprocedural examination: Secondary | ICD-10-CM | POA: Diagnosis not present

## 2022-10-01 HISTORY — DX: Influenza due to other identified influenza virus with other respiratory manifestations: J10.1

## 2022-10-01 LAB — MICROSCOPIC EXAMINATION
Epithelial Cells (non renal): >10 /hpf — AB (ref 0–10)
WBC, UA: >30 /hpf — AB (ref 0–5)

## 2022-10-01 LAB — URINALYSIS, COMPLETE
Bilirubin, UA: NEGATIVE
Glucose, UA: NEGATIVE
Ketones, UA: NEGATIVE
Nitrite, UA: NEGATIVE
Specific Gravity, UA: >1.03 — ABNORMAL HIGH (ref 1.005–1.030)
Urobilinogen, Ur: 0.2 mg/dL (ref 0.2–1.0)
pH, UA: 5.5 (ref 5.0–7.5)

## 2022-10-01 MED ORDER — BENZONATATE 100 MG PO CAPS: 100.0000 mg | ORAL_CAPSULE | Freq: Three times a day (TID) | ORAL | 1 refills | Status: DC | PRN

## 2022-10-01 MED ORDER — DIPHENOXYLATE-ATROPINE 2.5-0.025 MG PO TABS: 1.0000 | ORAL_TABLET | Freq: Four times a day (QID) | ORAL | 1 refills | Status: DC | PRN

## 2022-10-01 NOTE — Progress Notes (Signed)
Established Patient Office Visit  Subjective:  Patient ID: Alexis Christensen, female    DOB: 11-May-1971  Age: 52 y.o. MRN: [id number]  Chief Complaint  Patient presents with   Follow-up    Cough & diarrhea    Patient here today with continued side effects of the flu she tested positive for last week.   She continues to have cough and diarrhea.   Influenza This is a new problem. The current episode started 1 to 4 weeks ago. The problem occurs constantly. The problem has been gradually worsening. Associated symptoms include abdominal pain, chills and coughing. Associated symptoms comments: Diarrhea . The symptoms are aggravated by coughing, eating and drinking. She has tried eating, drinking, acetaminophen, NSAIDs, rest, sleep and lying down for the symptoms. The treatment provided mild relief.    No other concerns at this time.   Past Medical History:  Diagnosis Date   Acute diastolic (congestive) heart failure (HCC)    ADHD    Adopted    Anemia    Anxiety    Arthritis    Asthma    B12 deficiency    Bilateral swelling of feet and ankles    Bipolar 1 disorder (HCC)    Bipolar disorder (HCC)    Carpal tunnel syndrome 09/15/2014   Chest pain    CHF (congestive heart failure) ([place])    Dialostic CHF   Chronic diarrhea 09/07/2015   Chronic kidney disease    H/O KIDNEY STONES   Chronic venous insufficiency 04/26/2016   Depression    DVT (deep venous thrombosis) ([place]) 2016   RIGHT LEG   Edema leg    Effusion of knee 12/30/2013   Family history of adverse reaction to anesthesia    ADOPTED   Food allergy    GERD (gastroesophageal reflux disease)    Goiter    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   Headache(784.0)    MIGRAINES   Heart burn    Heart murmur    History of blood clots    History of kidney stones    HLD (hyperlipidemia)    Hx MRSA infection    Hyperlipidemia    Hypothyroidism    Lipoma of arm 05/11/2013   Lower extremity edema    Lymphedema 04/26/2016    Other fatigue    Post traumatic stress disorder (PTSD)    Prediabetes    PVD (peripheral vascular disease) (HCC)    Seasonal allergies    Shortness of breath    Shortness of breath on exertion    Stress incontinence    Thyroid disease    Urge incontinence    Vitamin D deficiency     Past Surgical History:  Procedure Laterality Date   ABDOMINAL HYSTERECTOMY     ANKLE SURGERY     CHOLECYSTECTOMY     COLONOSCOPY WITH PROPOFOL N/A 06/24/2019   Procedure: COLONOSCOPY WITH PROPOFOL;  Surgeon: Toledo, Benay Pike, MD;  Location: [facility];  Service: Gastroenterology;  Laterality: N/A;   CYSTOSCOPY N/A 04/09/2016   Procedure: CYSTOSCOPY;  Surgeon: Bjorn Loser, MD;  Location: [facility];  Service: Urology;  Laterality: N/A;   DILATION AND CURETTAGE OF UTERUS     ESOPHAGOGASTRODUODENOSCOPY (EGD) WITH PROPOFOL N/A 06/24/2019   Procedure: ESOPHAGOGASTRODUODENOSCOPY (EGD) WITH PROPOFOL;  Surgeon: Toledo, Benay Pike, MD;  Location: [facility];  Service: Gastroenterology;  Laterality: N/A;   INGUINAL HERNIA REPAIR Bilateral 04/18/2017   Procedure: LAPAROSCOPIC BILATERAL INGUINAL HERNIA REPAIR;  Surgeon: Jules Husbands, MD;  Location: [facility];  Service: General;  Laterality: Bilateral;   INTERSTIM IMPLANT PLACEMENT N/A 11/26/2016   Procedure: Barrie Lyme IMPLANT FIRST STAGE;  Surgeon: Bjorn Loser, MD;  Location: [facility];  Service: Urology;  Laterality: N/A;   INTERSTIM IMPLANT PLACEMENT N/A 11/26/2016   Procedure: Barrie Lyme IMPLANT SECOND STAGE;  Surgeon: Bjorn Loser, MD;  Location: [facility];  Service: Urology;  Laterality: N/A;   JOINT REPLACEMENT Right    knee   KNEE ARTHROSCOPY Bilateral    KNEE ARTHROSCOPY WITH LATERAL RELEASE Left 10/18/2015   Procedure: KNEE ARTHROSCOPY LATERAL AND PARTIAL SYNOVECTOMY;  Surgeon: Hessie Knows, MD;  Location: [facility];  Service: Orthopedics;  Laterality: Left;   Lymph Node removal  2015   Neck   PUBOVAGINAL SLING N/A 04/09/2016   Procedure:  PUBO-VAGINAL SLING/ RETROPUBIC SLING;  Surgeon: Bjorn Loser, MD;  Location: [facility];  Service: Urology;  Laterality: N/A;   REPLACEMENT TOTAL KNEE Right    SHOULDER SURGERY Right 2014   TUBAL LIGATION     WRIST SURGERY Right    metal plate    Social History   Socioeconomic History   Marital status: Divorced    Spouse name: Not on file   Number of children: Not on file   Years of education: Not on file   Highest education level: Not on file  Occupational History   Occupation: Customer Service    Employer: [employer]  Tobacco Use   Smoking status: Former    Packs/day: 0.50    Years: 10.00    Additional pack years: 0.00    Total pack years: 5.00    Types: Cigarettes    Start date: 07/16/2008    Quit date: 07/16/2019    Years since quitting: 3.2    Passive exposure: Past   Smokeless tobacco: Former   Tobacco comments:    STRESS RELATED  Vaping Use   Vaping Use: Never used  Substance and Sexual Activity   Alcohol use: Not Currently    Comment: Hisotry of ETOH abouse ASB patient   Drug use: Yes    Types: Other-see comments    Comment: Pt listed history of abuse of "prescription drugs"   Sexual activity: Not Currently  Other Topics Concern   Not on file  Social History Narrative   Not on file   Social Determinants of Health   Financial Resource Strain: Not on file  Food Insecurity: Not on file  Transportation Needs: Not on file  Physical Activity: Not on file  Stress: Not on file  Social Connections: Not on file  Intimate Partner Violence: Not on file    Family History  Adopted: Yes  Family history unknown: Yes    Allergies  Allergen Reactions   Geodon [Ziprasidone Hydrochloride] Other (See Comments)    Numbness ,sob, headaches, blurred vision   Sulfacetamide Sodium Hives   Ziprasidone Anaphylaxis    Other reaction(s): Other (See Comments) Numbness ,sob, headaches, blurred vision  Other reaction(s): Other (See Comments), Unknown Numbness ,sob,  headaches, blurred vision Numbness ,sob, headaches, blurred vision  Other reaction(s): Other (See Comments), Unknown Numbness ,sob, headaches, blurred vision Numbness ,sob, headaches, blurred vision   Numbness ,sob, headaches, blurred vision Other reaction(s): Other (See Comments), Unknown Numbness ,sob, headaches, blurred vision Numbness ,sob, headaches, blurred vision Other reaction(s): Other (See Comments), Unknown Numbness ,sob, headaches, blurred vision Numbness ,sob, headaches, blurred vision   Ziprasidone Hcl Anaphylaxis and Other (See Comments)    Other reaction(s): Other (See Comments), Unknown Numbness ,sob, headaches, blurred vision Numbness ,sob, headaches, blurred  vision   Ace Inhibitors Rash   Erythromycin Rash and Hives   Hydromorphone Rash   Lamictal [Lamotrigine] Rash    Other reaction(s): Unknown   Strawberry (Diagnostic) Rash   Sulfa Antibiotics Hives and Rash    Other reaction(s): Unknown Other reaction(s): Unknown Other reaction(s): Unknown    Review of Systems  Constitutional:  Positive for chills.  Respiratory:  Positive for cough.   Gastrointestinal:  Positive for abdominal pain.       Objective:   BP 110/74   Pulse 85   Ht 5\' 4"  (1.626 m)   Wt 228 lb 12.8 oz (103.8 kg)   SpO2 96%   BMI 39.27 kg/m   Vitals:   10/01/22 1305  BP: 110/74  Pulse: 85  Height: 5\' 4"  (1.626 m)  Weight: 228 lb 12.8 oz (103.8 kg)  SpO2: 96%  BMI (Calculated): 39.25    Physical Exam   Results for orders placed or performed in visit on 10/01/22  Microscopic Examination   Urine  Result Value Ref Range   WBC, UA >30 (A) 0 - 5 /hpf   RBC, Urine 3-10 (A) 0 - 2 /hpf   Epithelial Cells (non renal) >10 (A) 0 - 10 /hpf   Mucus, UA Present (A) Not Estab.   Bacteria, UA Many (A) None seen/Few  Urinalysis, Complete  Result Value Ref Range   Specific Gravity, UA >1.030 (H) 1.005 - 1.030   pH, UA 5.5 5.0 - 7.5   Color, UA Yellow Yellow   Appearance Ur Cloudy  (A) Clear   Leukocytes,UA 1+ (A) Negative   Protein,UA 1+ (A) Negative/Trace   Glucose, UA Negative Negative   Ketones, UA Negative Negative   RBC, UA Trace (A) Negative   Bilirubin, UA Negative Negative   Urobilinogen, Ur 0.2 0.2 - 1.0 mg/dL   Nitrite, UA Negative Negative   Microscopic Examination See below:     Recent Results (from the past 2160 hour(s))  Urinalysis, Complete     Status: Abnormal   Collection Time: 09/17/22  8:23 AM  Result Value Ref Range   Specific Gravity, UA 1.025 1.005 - 1.030   pH, UA 5.5 5.0 - 7.5   Color, UA Yellow Yellow   Appearance Ur Hazy (A) Clear   Leukocytes,UA 2+ (A) Negative   Protein,UA Negative Negative/Trace   Glucose, UA Negative Negative   Ketones, UA Negative Negative   RBC, UA Trace (A) Negative   Bilirubin, UA Negative Negative   Urobilinogen, Ur 0.2 0.2 - 1.0 mg/dL   Nitrite, UA Negative Negative   Microscopic Examination See below:   Microscopic Examination     Status: Abnormal   Collection Time: 09/17/22  8:23 AM   Urine  Result Value Ref Range   WBC, UA >30 (A) 0 - 5 /hpf   RBC, Urine 3-10 (A) 0 - 2 /hpf   Epithelial Cells (non renal) 0-10 0 - 10 /hpf   Renal Epithel, UA 0-10 (A) None seen /hpf   Casts Present (A) None seen /lpf   Cast Type Hyaline casts N/A   Mucus, UA Present (A) Not Estab.   Bacteria, UA Many (A) None seen/Few  CULTURE, URINE COMPREHENSIVE     Status: None   Collection Time: 09/17/22  8:40 AM   Specimen: Urine   UR  Result Value Ref Range   Urine Culture, Comprehensive Final report    Organism ID, Bacteria Comment     Comment: Mixed urogenital flora 10,000-25,000 colony forming units per  mL   Urinalysis, Complete     Status: Abnormal   Collection Time: 10/01/22  9:20 AM  Result Value Ref Range   Specific Gravity, UA >1.030 (H) 1.005 - 1.030   pH, UA 5.5 5.0 - 7.5   Color, UA Yellow Yellow   Appearance Ur Cloudy (A) Clear   Leukocytes,UA 1+ (A) Negative   Protein,UA 1+ (A) Negative/Trace    Glucose, UA Negative Negative   Ketones, UA Negative Negative   RBC, UA Trace (A) Negative   Bilirubin, UA Negative Negative   Urobilinogen, Ur 0.2 0.2 - 1.0 mg/dL   Nitrite, UA Negative Negative   Microscopic Examination See below:   Microscopic Examination     Status: Abnormal   Collection Time: 10/01/22  9:20 AM   Urine  Result Value Ref Range   WBC, UA >30 (A) 0 - 5 /hpf   RBC, Urine 3-10 (A) 0 - 2 /hpf   Epithelial Cells (non renal) >10 (A) 0 - 10 /hpf   Mucus, UA Present (A) Not Estab.   Bacteria, UA Many (A) None seen/Few      Assessment & Plan:   Problem List Items Addressed This Visit     Influenza A   Other Visit Diagnoses     Dyspnea, unspecified type    -  Primary   Relevant Orders   DG Chest 2 View (Completed)       Return if needed..   Total time spent: 20 minutes  Mechele Claude, FNP  10/01/2022

## 2022-10-01 NOTE — Progress Notes (Signed)
10/01/2022 1:10 PM   Shauntee ZALIE JERSEY 02/21/1971 [id number]  CC: Chief Complaint  Patient presents with   Follow-up    Interstim battey check   HPI: Alexis Christensen is a 52 y.o. female with PMH OAB wet s/p InterStim in 2018 and chronic cystitis who presents today for recheck and repeat UA today.   Today she reports persistent pelvic cramping without true dysuria.  She left a urine specimen at the end of our visit today, results not available during our visit.  I was able to connect her InterStim iCon device and noted no low battery message on the programmer.  PMH: Past Medical History:  Diagnosis Date   Acute diastolic (congestive) heart failure (HCC)    ADHD    Adopted    Anemia    Anxiety    Arthritis    Asthma    B12 deficiency    Bilateral swelling of feet and ankles    Bipolar 1 disorder (HCC)    Bipolar disorder (HCC)    Carpal tunnel syndrome 09/15/2014   Chest pain    CHF (congestive heart failure) ([place])    Dialostic CHF   Chronic diarrhea 09/07/2015   Chronic kidney disease    H/O KIDNEY STONES   Chronic venous insufficiency 04/26/2016   Depression    DVT (deep venous thrombosis) ([place]) 2016   RIGHT LEG   Edema leg    Effusion of knee 12/30/2013   Family history of adverse reaction to anesthesia    ADOPTED   Food allergy    GERD (gastroesophageal reflux disease)    Goiter    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   Headache(784.0)    MIGRAINES   Heart burn    Heart murmur    History of blood clots    History of kidney stones    HLD (hyperlipidemia)    Hx MRSA infection    Hyperlipidemia    Hypothyroidism    Lipoma of arm 05/11/2013   Lower extremity edema    Lymphedema 04/26/2016   Other fatigue    Post traumatic stress disorder (PTSD)    Prediabetes    PVD (peripheral vascular disease) (HCC)    Seasonal allergies    Shortness of breath    Shortness of breath on exertion    Stress incontinence    Thyroid disease    Urge incontinence     Vitamin D deficiency     Surgical History: Past Surgical History:  Procedure Laterality Date   ABDOMINAL HYSTERECTOMY     ANKLE SURGERY     CHOLECYSTECTOMY     COLONOSCOPY WITH PROPOFOL N/A 06/24/2019   Procedure: COLONOSCOPY WITH PROPOFOL;  Surgeon: Toledo, Benay Pike, MD;  Location: [facility];  Service: Gastroenterology;  Laterality: N/A;   CYSTOSCOPY N/A 04/09/2016   Procedure: CYSTOSCOPY;  Surgeon: Bjorn Loser, MD;  Location: [facility];  Service: Urology;  Laterality: N/A;   DILATION AND CURETTAGE OF UTERUS     ESOPHAGOGASTRODUODENOSCOPY (EGD) WITH PROPOFOL N/A 06/24/2019   Procedure: ESOPHAGOGASTRODUODENOSCOPY (EGD) WITH PROPOFOL;  Surgeon: Toledo, Benay Pike, MD;  Location: [facility];  Service: Gastroenterology;  Laterality: N/A;   INGUINAL HERNIA REPAIR Bilateral 04/18/2017   Procedure: LAPAROSCOPIC BILATERAL INGUINAL HERNIA REPAIR;  Surgeon: Jules Husbands, MD;  Location: [facility];  Service: General;  Laterality: Bilateral;   INTERSTIM IMPLANT PLACEMENT N/A 11/26/2016   Procedure: Barrie Lyme IMPLANT FIRST STAGE;  Surgeon: Bjorn Loser, MD;  Location: [facility];  Service: Urology;  Laterality: N/A;  INTERSTIM IMPLANT PLACEMENT N/A 11/26/2016   Procedure: Barrie Lyme IMPLANT SECOND STAGE;  Surgeon: Bjorn Loser, MD;  Location: [facility];  Service: Urology;  Laterality: N/A;   JOINT REPLACEMENT Right    knee   KNEE ARTHROSCOPY Bilateral    KNEE ARTHROSCOPY WITH LATERAL RELEASE Left 10/18/2015   Procedure: KNEE ARTHROSCOPY LATERAL AND PARTIAL SYNOVECTOMY;  Surgeon: Hessie Knows, MD;  Location: [facility];  Service: Orthopedics;  Laterality: Left;   Lymph Node removal  2015   Neck   PUBOVAGINAL SLING N/A 04/09/2016   Procedure: PUBO-VAGINAL SLING/ RETROPUBIC SLING;  Surgeon: Bjorn Loser, MD;  Location: [facility];  Service: Urology;  Laterality: N/A;   REPLACEMENT TOTAL KNEE Right    SHOULDER SURGERY Right 2014   TUBAL LIGATION     WRIST SURGERY Right    metal plate     Home Medications:  Allergies as of 10/01/2022       Reactions   Geodon [ziprasidone Hydrochloride] Other (See Comments)   Numbness ,sob, headaches, blurred vision   Sulfacetamide Sodium Hives   Ziprasidone Anaphylaxis   Other reaction(s): Other (See Comments) Numbness ,sob, headaches, blurred vision Other reaction(s): Other (See Comments), Unknown Numbness ,sob, headaches, blurred vision Numbness ,sob, headaches, blurred vision Other reaction(s): Other (See Comments), Unknown Numbness ,sob, headaches, blurred vision Numbness ,sob, headaches, blurred vision Numbness ,sob, headaches, blurred vision Other reaction(s): Other (See Comments), Unknown Numbness ,sob, headaches, blurred vision Numbness ,sob, headaches, blurred vision Other reaction(s): Other (See Comments), Unknown Numbness ,sob, headaches, blurred vision Numbness ,sob, headaches, blurred vision   Ziprasidone Hcl Anaphylaxis, Other (See Comments)   Other reaction(s): Other (See Comments), Unknown Numbness ,sob, headaches, blurred vision Numbness ,sob, headaches, blurred vision   Ace Inhibitors Rash   Erythromycin Rash, Hives   Hydromorphone Rash   Lamictal [lamotrigine] Rash   Other reaction(s): Unknown   Strawberry (diagnostic) Rash   Sulfa Antibiotics Hives, Rash   Other reaction(s): Unknown Other reaction(s): Unknown Other reaction(s): Unknown        Medication List        Accurate as of October 01, 2022  1:10 PM. If you have any questions, ask your nurse or doctor.          ARIPiprazole 30 MG tablet Commonly known as: ABILIFY Take 30 mg by mouth daily.   aspirin 81 MG chewable tablet Aspirin 81 MG Oral Tablet Chewable QTY: 30 tablet Days: 30 Refills: 0  Written: 01/23/21 Patient Instructions: once a day   celecoxib 100 MG capsule Commonly known as: CELEBREX Take by mouth.   colestipol 1 g tablet Commonly known as: COLESTID Take 2 g by mouth 2 (two) times daily.   cyanocobalamin 500 MCG  tablet Commonly known as: VITAMIN B12 Take 1 tablet (500 mcg total) by mouth daily.   dicyclomine 10 MG capsule Commonly known as: BENTYL Take 10 mg by mouth every 8 (eight) hours as needed.   DULoxetine 60 MG capsule Commonly known as: CYMBALTA Take 60 mg by mouth 2 (two) times daily.   furosemide 20 MG tablet Commonly known as: LASIX Take 40 mg by mouth daily.   gabapentin 600 MG tablet Commonly known as: NEURONTIN Take 600 mg by mouth 2 (two) times daily.   hydrOXYzine 25 MG tablet Commonly known as: ATARAX Take 50 mg by mouth every 8 (eight) hours as needed for itching.   Klor-Con M10 10 MEQ tablet Generic drug: potassium chloride Take 10 mEq by mouth daily.   levothyroxine 200 MCG tablet Commonly known as:  SYNTHROID PLEASE SEE ATTACHED FOR DETAILED DIRECTIONS   liothyronine 5 MCG tablet Commonly known as: CYTOMEL Take 5 mcg by mouth daily.   lisinopril 5 MG tablet Commonly known as: ZESTRIL Take 5 mg by mouth daily.   metFORMIN 500 MG tablet Commonly known as: GLUCOPHAGE Take 1 tablet (500 mg total) by mouth 3 (three) times daily.   nortriptyline 10 MG capsule Commonly known as: PAMELOR Take 10 mg in the morning   ondansetron 4 MG tablet Commonly known as: ZOFRAN Take 4 mg by mouth every 8 (eight) hours as needed.   pantoprazole 20 MG tablet Commonly known as: PROTONIX Take 20 mg by mouth daily.   Repatha SureClick [id number] MG/ML Soaj Generic drug: Evolocumab Inject into the skin.   rosuvastatin 40 MG tablet Commonly known as: CRESTOR Take 40 mg by mouth daily.   Rybelsus 3 MG Tabs Generic drug: Semaglutide Take 1 tablet (3 mg total) by mouth daily.   spironolactone 25 MG tablet Commonly known as: ALDACTONE TAKE 1/2 (HALF) TABLET BY MOUTH ONCE DAILY.   Suboxone 8-2 MG Film Generic drug: Buprenorphine HCl-Naloxone HCl Dissolve 1 film under tongue twice daily   Vitamin D (Ergocalciferol) 1.25 MG (50000 UNIT) Caps capsule Commonly known as:  DRISDOL 1 po q wed and 1 po q sun        Allergies:  Allergies  Allergen Reactions   Geodon [Ziprasidone Hydrochloride] Other (See Comments)    Numbness ,sob, headaches, blurred vision   Sulfacetamide Sodium Hives   Ziprasidone Anaphylaxis    Other reaction(s): Other (See Comments) Numbness ,sob, headaches, blurred vision  Other reaction(s): Other (See Comments), Unknown Numbness ,sob, headaches, blurred vision Numbness ,sob, headaches, blurred vision  Other reaction(s): Other (See Comments), Unknown Numbness ,sob, headaches, blurred vision Numbness ,sob, headaches, blurred vision   Numbness ,sob, headaches, blurred vision Other reaction(s): Other (See Comments), Unknown Numbness ,sob, headaches, blurred vision Numbness ,sob, headaches, blurred vision Other reaction(s): Other (See Comments), Unknown Numbness ,sob, headaches, blurred vision Numbness ,sob, headaches, blurred vision   Ziprasidone Hcl Anaphylaxis and Other (See Comments)    Other reaction(s): Other (See Comments), Unknown Numbness ,sob, headaches, blurred vision Numbness ,sob, headaches, blurred vision   Ace Inhibitors Rash   Erythromycin Rash and Hives   Hydromorphone Rash   Lamictal [Lamotrigine] Rash    Other reaction(s): Unknown   Strawberry (Diagnostic) Rash   Sulfa Antibiotics Hives and Rash    Other reaction(s): Unknown Other reaction(s): Unknown Other reaction(s): Unknown    Family History: Family History  Adopted: Yes  Family history unknown: Yes    Social History:   reports that she quit smoking about 3 years ago. Her smoking use included cigarettes. She started smoking about 14 years ago. She has a 5.00 pack-year smoking history. She has been exposed to tobacco smoke. She has quit using smokeless tobacco. She reports that she does not currently use alcohol. She reports current drug use. Drug: Other-see comments.  Physical Exam: BP 114/79   Pulse 83   Ht 5\' 4"  (1.626 m)   Wt 220 lb  (99.8 kg)   BMI 37.76 kg/m   Constitutional:  Alert and oriented, no acute distress, nontoxic appearing HEENT: [place], AT Cardiovascular: No clubbing, cyanosis, or edema Respiratory: Normal respiratory effort, no increased work of breathing Skin: No rashes, bruises or suspicious lesions Neurologic: Grossly intact, no focal deficits, moving all 4 extremities Psychiatric: Normal mood and affect  Assessment & Plan:   1. Pelvic cramping Persistent.  Will obtain UA  today and call her with her results, treating as indicated. - Urinalysis, Complete  2. Mixed incontinence No low battery message on her InterStim device today.  Will reach out to her InterStim rep to discuss more definitive battery assessment.  Return for Will call with results.  Debroah Loop, PA-C  [facility] [address] [place], [place] [address] [phone]

## 2022-10-02 DIAGNOSIS — Z79899 Other long term (current) drug therapy: Secondary | ICD-10-CM | POA: Diagnosis not present

## 2022-10-05 ENCOUNTER — Telehealth: Payer: Self-pay | Admitting: Physician Assistant

## 2022-10-05 DIAGNOSIS — M25561 Pain in right knee: Secondary | ICD-10-CM | POA: Diagnosis not present

## 2022-10-05 MED ORDER — FOSFOMYCIN TROMETHAMINE 3 G PO PACK: 3.0000 g | PACK | Freq: Once | ORAL | 0 refills | Status: AC

## 2022-10-05 NOTE — Telephone Encounter (Signed)
Contact info provided to [employer] rep Lita Mains.

## 2022-10-05 NOTE — Telephone Encounter (Signed)
Please contact the patient and let her know that I have been in communication with our [employer] reps.  They would like to coordinate with her directly to better assess her InterStim battery.  Would she be comfortable with me sharing her phone number with them so they can reach out to her?

## 2022-10-05 NOTE — Telephone Encounter (Signed)
Spoke with patient and advised results rx sent to pharmacy by e-script Yes she is ok with giving her number to the [employer] rep.    Her UA from yesterday does look a bit infected.  Lets treat with empiric fosfomycin 3 g x 1 dose, please counsel her on lower cost pharmacies and GoodRx coupons to keep this medication less expensive.  Please also let her know that I reached out to her InterStim rep and she would like to schedule a follow-up office visit with all 3 of [name] to better assess her battery status.  I will be in touch with more information about this as I learn more.

## 2022-10-08 ENCOUNTER — Other Ambulatory Visit: Payer: 59

## 2022-10-08 DIAGNOSIS — I10 Essential (primary) hypertension: Secondary | ICD-10-CM | POA: Diagnosis not present

## 2022-10-08 DIAGNOSIS — E039 Hypothyroidism, unspecified: Secondary | ICD-10-CM

## 2022-10-08 DIAGNOSIS — E785 Hyperlipidemia, unspecified: Secondary | ICD-10-CM | POA: Diagnosis not present

## 2022-10-08 DIAGNOSIS — E6609 Other obesity due to excess calories: Secondary | ICD-10-CM

## 2022-10-08 DIAGNOSIS — R7301 Impaired fasting glucose: Secondary | ICD-10-CM | POA: Diagnosis not present

## 2022-10-09 ENCOUNTER — Other Ambulatory Visit: Payer: Self-pay | Admitting: Nurse Practitioner

## 2022-10-09 ENCOUNTER — Other Ambulatory Visit: Payer: Self-pay | Admitting: Cardiovascular Disease

## 2022-10-09 DIAGNOSIS — I5030 Unspecified diastolic (congestive) heart failure: Secondary | ICD-10-CM

## 2022-10-09 LAB — CBC WITH DIFFERENTIAL
Basophils Absolute: 0.1 10*3/uL (ref 0.0–0.2)
Basos: 1 %
EOS (ABSOLUTE): 0.2 10*3/uL (ref 0.0–0.4)
Eos: 3 %
Hematocrit: 34.4 % (ref 34.0–46.6)
Hemoglobin: 11.4 g/dL (ref 11.1–15.9)
Immature Grans (Abs): 0 10*3/uL (ref 0.0–0.1)
Immature Granulocytes: 0 %
Lymphocytes Absolute: 2.6 10*3/uL (ref 0.7–3.1)
Lymphs: 29 %
MCH: 28.9 pg (ref 26.6–33.0)
MCHC: 33.1 g/dL (ref 31.5–35.7)
MCV: 87 fL (ref 79–97)
Monocytes Absolute: 0.7 10*3/uL (ref 0.1–0.9)
Monocytes: 8 %
Neutrophils Absolute: 5.3 10*3/uL (ref 1.4–7.0)
Neutrophils: 59 %
RBC: 3.95 x10E6/uL (ref 3.77–5.28)
RDW: 12.8 % (ref 11.7–15.4)
WBC: 8.9 10*3/uL (ref 3.4–10.8)

## 2022-10-09 LAB — CMP14+EGFR
ALT: 23 IU/L (ref 0–32)
AST: 20 IU/L (ref 0–40)
Albumin/Globulin Ratio: 1.6 (ref 1.2–2.2)
Albumin: 3.9 g/dL (ref 3.8–4.9)
Alkaline Phosphatase: 118 IU/L (ref 44–121)
BUN/Creatinine Ratio: 18 (ref 9–23)
BUN: 12 mg/dL (ref 6–24)
Bilirubin Total: 0.3 mg/dL (ref 0.0–1.2)
CO2: 27 mmol/L (ref 20–29)
Calcium: 9.1 mg/dL (ref 8.7–10.2)
Chloride: 101 mmol/L (ref 96–106)
Creatinine, Ser: 0.67 mg/dL (ref 0.57–1.00)
Globulin, Total: 2.4 g/dL (ref 1.5–4.5)
Glucose: 94 mg/dL (ref 70–99)
Potassium: 4.2 mmol/L (ref 3.5–5.2)
Sodium: 141 mmol/L (ref 134–144)
Total Protein: 6.3 g/dL (ref 6.0–8.5)
eGFR: 104 mL/min/{1.73_m2} (ref >59)

## 2022-10-09 LAB — HEMOGLOBIN A1C
Est. average glucose Bld gHb Est-mCnc: 126 mg/dL
Hgb A1c MFr Bld: 6 % — ABNORMAL HIGH (ref 4.8–5.6)

## 2022-10-09 LAB — LIPID PANEL
Chol/HDL Ratio: 3 ratio (ref 0.0–4.4)
Cholesterol, Total: 196 mg/dL (ref 100–199)
HDL: 66 mg/dL (ref >39)
LDL Chol Calc (NIH): 113 mg/dL — ABNORMAL HIGH (ref 0–99)
Triglycerides: 96 mg/dL (ref 0–149)
VLDL Cholesterol Cal: 17 mg/dL (ref 5–40)

## 2022-10-09 LAB — TSH: TSH: 15.2 u[IU]/mL — ABNORMAL HIGH (ref 0.450–4.500)

## 2022-10-11 ENCOUNTER — Encounter: Payer: Self-pay | Admitting: Nurse Practitioner

## 2022-10-11 ENCOUNTER — Ambulatory Visit (INDEPENDENT_AMBULATORY_CARE_PROVIDER_SITE_OTHER): Payer: 59 | Admitting: Nurse Practitioner

## 2022-10-11 VITALS — BP 120/80 | HR 94 | Ht 64.0 in | Wt 233.8 lb

## 2022-10-11 DIAGNOSIS — I890 Lymphedema, not elsewhere classified: Secondary | ICD-10-CM | POA: Diagnosis not present

## 2022-10-11 DIAGNOSIS — R7303 Prediabetes: Secondary | ICD-10-CM

## 2022-10-11 DIAGNOSIS — K219 Gastro-esophageal reflux disease without esophagitis: Secondary | ICD-10-CM

## 2022-10-11 DIAGNOSIS — E038 Other specified hypothyroidism: Secondary | ICD-10-CM

## 2022-10-11 DIAGNOSIS — F39 Unspecified mood [affective] disorder: Secondary | ICD-10-CM

## 2022-10-11 MED ORDER — PANTOPRAZOLE SODIUM 40 MG PO TBEC: 40.0000 mg | DELAYED_RELEASE_TABLET | Freq: Two times a day (BID) | ORAL | 0 refills | Status: DC

## 2022-10-11 NOTE — Patient Instructions (Addendum)
1) Follow up 4 weeks, acid reflux

## 2022-10-11 NOTE — Progress Notes (Signed)
Established Patient Office Visit  Subjective:  Patient ID: Alexis Christensen, female    DOB: 1971/01/13  Age: 52 y.o. MRN: [id number]  Chief Complaint  Patient presents with   Follow-up    4 month follow up, review lab results.    Lab results review today.  LDL is too high, she is on both rosuvastatin and with Cards patient is on Repatha.  A1c is 6.0% so patient will continue to eliminate sugars and small carb portion sizes.  Patient continues to see Endocrinology for hypothyroidism, and thyroid nodules.  C/o acid reflux related.    No other concerns at this time.   Past Medical History:  Diagnosis Date   Acute diastolic (congestive) heart failure (HCC)    ADHD    Adopted    Anemia    Anxiety    Arthritis    Asthma    B12 deficiency    Bilateral swelling of feet and ankles    Bipolar 1 disorder (HCC)    Bipolar disorder (HCC)    Carpal tunnel syndrome 09/15/2014   Chest pain    CHF (congestive heart failure) ([place])    Dialostic CHF   Chronic diarrhea 09/07/2015   Chronic kidney disease    H/O KIDNEY STONES   Chronic venous insufficiency 04/26/2016   Depression    DVT (deep venous thrombosis) ([place]) 2016   RIGHT LEG   Edema leg    Effusion of knee 12/30/2013   Family history of adverse reaction to anesthesia    ADOPTED   Food allergy    GERD (gastroesophageal reflux disease)    Goiter    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   Headache(784.0)    MIGRAINES   Heart burn    Heart murmur    History of blood clots    History of kidney stones    HLD (hyperlipidemia)    Hx MRSA infection    Hyperlipidemia    Hypothyroidism    Lipoma of arm 05/11/2013   Lower extremity edema    Lymphedema 04/26/2016   Other fatigue    Post traumatic stress disorder (PTSD)    Prediabetes    PVD (peripheral vascular disease) (HCC)    Seasonal allergies    Shortness of breath    Shortness of breath on exertion    Stress incontinence    Thyroid disease    Urge incontinence     Vitamin D deficiency     Past Surgical History:  Procedure Laterality Date   ABDOMINAL HYSTERECTOMY     ANKLE SURGERY     CHOLECYSTECTOMY     COLONOSCOPY WITH PROPOFOL N/A 06/24/2019   Procedure: COLONOSCOPY WITH PROPOFOL;  Surgeon: Toledo, Benay Pike, MD;  Location: [facility];  Service: Gastroenterology;  Laterality: N/A;   CYSTOSCOPY N/A 04/09/2016   Procedure: CYSTOSCOPY;  Surgeon: Bjorn Loser, MD;  Location: [facility];  Service: Urology;  Laterality: N/A;   DILATION AND CURETTAGE OF UTERUS     ESOPHAGOGASTRODUODENOSCOPY (EGD) WITH PROPOFOL N/A 06/24/2019   Procedure: ESOPHAGOGASTRODUODENOSCOPY (EGD) WITH PROPOFOL;  Surgeon: Toledo, Benay Pike, MD;  Location: [facility];  Service: Gastroenterology;  Laterality: N/A;   INGUINAL HERNIA REPAIR Bilateral 04/18/2017   Procedure: LAPAROSCOPIC BILATERAL INGUINAL HERNIA REPAIR;  Surgeon: Jules Husbands, MD;  Location: [facility];  Service: General;  Laterality: Bilateral;   INTERSTIM IMPLANT PLACEMENT N/A 11/26/2016   Procedure: Barrie Lyme IMPLANT FIRST STAGE;  Surgeon: Bjorn Loser, MD;  Location: [facility];  Service: Urology;  Laterality: N/A;  INTERSTIM IMPLANT PLACEMENT N/A 11/26/2016   Procedure: Barrie Lyme IMPLANT SECOND STAGE;  Surgeon: Bjorn Loser, MD;  Location: [facility];  Service: Urology;  Laterality: N/A;   JOINT REPLACEMENT Right    knee   KNEE ARTHROSCOPY Bilateral    KNEE ARTHROSCOPY WITH LATERAL RELEASE Left 10/18/2015   Procedure: KNEE ARTHROSCOPY LATERAL AND PARTIAL SYNOVECTOMY;  Surgeon: Hessie Knows, MD;  Location: [facility];  Service: Orthopedics;  Laterality: Left;   Lymph Node removal  2015   Neck   PUBOVAGINAL SLING N/A 04/09/2016   Procedure: PUBO-VAGINAL SLING/ RETROPUBIC SLING;  Surgeon: Bjorn Loser, MD;  Location: [facility];  Service: Urology;  Laterality: N/A;   REPLACEMENT TOTAL KNEE Right    SHOULDER SURGERY Right 2014   TUBAL LIGATION     WRIST SURGERY Right    metal plate    Social History    Socioeconomic History   Marital status: Divorced    Spouse name: Not on file   Number of children: Not on file   Years of education: Not on file   Highest education level: Not on file  Occupational History   Occupation: Customer Service    Employer: [employer]  Tobacco Use   Smoking status: Former    Packs/day: 0.50    Years: 10.00    Additional pack years: 0.00    Total pack years: 5.00    Types: Cigarettes    Start date: 07/16/2008    Quit date: 07/16/2019    Years since quitting: 3.2    Passive exposure: Past   Smokeless tobacco: Former   Tobacco comments:    STRESS RELATED  Vaping Use   Vaping Use: Never used  Substance and Sexual Activity   Alcohol use: Not Currently    Comment: Hisotry of ETOH abouse ASB patient   Drug use: Yes    Types: Other-see comments    Comment: Pt listed history of abuse of "prescription drugs"   Sexual activity: Not Currently  Other Topics Concern   Not on file  Social History Narrative   Not on file   Social Determinants of Health   Financial Resource Strain: Not on file  Food Insecurity: Not on file  Transportation Needs: Not on file  Physical Activity: Not on file  Stress: Not on file  Social Connections: Not on file  Intimate Partner Violence: Not on file    Family History  Adopted: Yes  Family history unknown: Yes    Allergies  Allergen Reactions   Geodon [Ziprasidone Hydrochloride] Other (See Comments)    Numbness ,sob, headaches, blurred vision   Sulfacetamide Sodium Hives   Ziprasidone Anaphylaxis    Other reaction(s): Other (See Comments) Numbness ,sob, headaches, blurred vision  Other reaction(s): Other (See Comments), Unknown Numbness ,sob, headaches, blurred vision Numbness ,sob, headaches, blurred vision  Other reaction(s): Other (See Comments), Unknown Numbness ,sob, headaches, blurred vision Numbness ,sob, headaches, blurred vision   Numbness ,sob, headaches, blurred vision Other reaction(s): Other  (See Comments), Unknown Numbness ,sob, headaches, blurred vision Numbness ,sob, headaches, blurred vision Other reaction(s): Other (See Comments), Unknown Numbness ,sob, headaches, blurred vision Numbness ,sob, headaches, blurred vision   Ziprasidone Hcl Anaphylaxis and Other (See Comments)    Other reaction(s): Other (See Comments), Unknown Numbness ,sob, headaches, blurred vision Numbness ,sob, headaches, blurred vision   Ace Inhibitors Rash   Erythromycin Rash and Hives   Hydromorphone Rash   Lamictal [Lamotrigine] Rash    Other reaction(s): Unknown   Strawberry (Diagnostic) Rash   Sulfa Antibiotics  Hives and Rash    Other reaction(s): Unknown Other reaction(s): Unknown Other reaction(s): Unknown    Review of Systems  Constitutional: Negative.   HENT: Negative.    Eyes: Negative.   Respiratory: Negative.    Cardiovascular: Negative.   Gastrointestinal:  Positive for heartburn.  Genitourinary: Negative.   Musculoskeletal: Negative.   Skin: Negative.   Neurological: Negative.   Endo/Heme/Allergies: Negative.   Psychiatric/Behavioral: Negative.         Objective:   BP 120/80   Pulse 94   Ht 5\' 4"  (1.626 m)   Wt 233 lb 12.8 oz (106.1 kg)   SpO2 97%   BMI 40.13 kg/m   Vitals:   10/11/22 1326  BP: 120/80  Pulse: 94  Height: 5\' 4"  (1.626 m)  Weight: 233 lb 12.8 oz (106.1 kg)  SpO2: 97%  BMI (Calculated): 40.11    Physical Exam Vitals reviewed.  Constitutional:      Appearance: Normal appearance.  HENT:     Head: Normocephalic.     Nose: Nose normal.     Mouth/Throat:     Mouth: Mucous membranes are moist.  Eyes:     Pupils: Pupils are equal, round, and reactive to light.  Cardiovascular:     Rate and Rhythm: Normal rate and regular rhythm.  Pulmonary:     Effort: Pulmonary effort is normal.     Breath sounds: Normal breath sounds.  Abdominal:     General: Bowel sounds are normal.     Palpations: Abdomen is soft.  Musculoskeletal:         General: Normal range of motion.     Cervical back: Normal range of motion and neck supple.  Skin:    General: Skin is warm and dry.  Neurological:     Mental Status: She is alert and oriented to person, place, and time.  Psychiatric:        Mood and Affect: Mood normal.        Behavior: Behavior normal.      No results found for any visits on 10/11/22.  Recent Results (from the past 2160 hour(s))  Urinalysis, Complete     Status: Abnormal   Collection Time: 09/17/22  8:23 AM  Result Value Ref Range   Specific Gravity, UA 1.025 1.005 - 1.030   pH, UA 5.5 5.0 - 7.5   Color, UA Yellow Yellow   Appearance Ur Hazy (A) Clear   Leukocytes,UA 2+ (A) Negative   Protein,UA Negative Negative/Trace   Glucose, UA Negative Negative   Ketones, UA Negative Negative   RBC, UA Trace (A) Negative   Bilirubin, UA Negative Negative   Urobilinogen, Ur 0.2 0.2 - 1.0 mg/dL   Nitrite, UA Negative Negative   Microscopic Examination See below:   Microscopic Examination     Status: Abnormal   Collection Time: 09/17/22  8:23 AM   Urine  Result Value Ref Range   WBC, UA >30 (A) 0 - 5 /hpf   RBC, Urine 3-10 (A) 0 - 2 /hpf   Epithelial Cells (non renal) 0-10 0 - 10 /hpf   Renal Epithel, UA 0-10 (A) None seen /hpf   Casts Present (A) None seen /lpf   Cast Type Hyaline casts N/A   Mucus, UA Present (A) Not Estab.   Bacteria, UA Many (A) None seen/Few  CULTURE, URINE COMPREHENSIVE     Status: None   Collection Time: 09/17/22  8:40 AM   Specimen: Urine   UR  Result Value Ref  Range   Urine Culture, Comprehensive Final report    Organism ID, Bacteria Comment     Comment: Mixed urogenital flora 10,000-25,000 colony forming units per mL   Urinalysis, Complete     Status: Abnormal   Collection Time: 10/01/22  9:20 AM  Result Value Ref Range   Specific Gravity, UA >1.030 (H) 1.005 - 1.030   pH, UA 5.5 5.0 - 7.5   Color, UA Yellow Yellow   Appearance Ur Cloudy (A) Clear   Leukocytes,UA 1+ (A)  Negative   Protein,UA 1+ (A) Negative/Trace   Glucose, UA Negative Negative   Ketones, UA Negative Negative   RBC, UA Trace (A) Negative   Bilirubin, UA Negative Negative   Urobilinogen, Ur 0.2 0.2 - 1.0 mg/dL   Nitrite, UA Negative Negative   Microscopic Examination See below:   Microscopic Examination     Status: Abnormal   Collection Time: 10/01/22  9:20 AM   Urine  Result Value Ref Range   WBC, UA >30 (A) 0 - 5 /hpf   RBC, Urine 3-10 (A) 0 - 2 /hpf   Epithelial Cells (non renal) >10 (A) 0 - 10 /hpf   Mucus, UA Present (A) Not Estab.   Bacteria, UA Many (A) None seen/Few  CBC With Differential     Status: None   Collection Time: 10/08/22  4:02 PM  Result Value Ref Range   WBC 8.9 3.4 - 10.8 x10E3/uL   RBC 3.95 3.77 - 5.28 x10E6/uL   Hemoglobin 11.4 11.1 - 15.9 g/dL   Hematocrit 34.4 34.0 - 46.6 %   MCV 87 79 - 97 fL   MCH 28.9 26.6 - 33.0 pg   MCHC 33.1 31.5 - 35.7 g/dL   RDW 12.8 11.7 - 15.4 %   Neutrophils 59 Not Estab. %   Lymphs 29 Not Estab. %   Monocytes 8 Not Estab. %   Eos 3 Not Estab. %   Basos 1 Not Estab. %   Neutrophils Absolute 5.3 1.4 - 7.0 x10E3/uL   Lymphocytes Absolute 2.6 0.7 - 3.1 x10E3/uL   Monocytes Absolute 0.7 0.1 - 0.9 x10E3/uL   EOS (ABSOLUTE) 0.2 0.0 - 0.4 x10E3/uL   Basophils Absolute 0.1 0.0 - 0.2 x10E3/uL   Immature Granulocytes 0 Not Estab. %   Immature Grans (Abs) 0.0 0.0 - 0.1 x10E3/uL  Hemoglobin A1c     Status: Abnormal   Collection Time: 10/08/22  4:02 PM  Result Value Ref Range   Hgb A1c MFr Bld 6.0 (H) 4.8 - 5.6 %    Comment:          Prediabetes: 5.7 - 6.4          Diabetes: >6.4          Glycemic control for adults with diabetes: <7.0    Est. average glucose Bld gHb Est-mCnc 126 mg/dL  TSH     Status: Abnormal   Collection Time: 10/08/22  4:02 PM  Result Value Ref Range   TSH 15.200 (H) 0.450 - 4.500 uIU/mL  CMP14+EGFR     Status: None   Collection Time: 10/08/22  4:02 PM  Result Value Ref Range   Glucose 94 70 - 99  mg/dL   BUN 12 6 - 24 mg/dL   Creatinine, Ser 0.67 0.57 - 1.00 mg/dL   eGFR 104 >59 mL/min/1.73   BUN/Creatinine Ratio 18 9 - 23   Sodium 141 134 - 144 mmol/L   Potassium 4.2 3.5 - 5.2 mmol/L   Chloride  101 96 - 106 mmol/L   CO2 27 20 - 29 mmol/L   Calcium 9.1 8.7 - 10.2 mg/dL   Total Protein 6.3 6.0 - 8.5 g/dL   Albumin 3.9 3.8 - 4.9 g/dL   Globulin, Total 2.4 1.5 - 4.5 g/dL   Albumin/Globulin Ratio 1.6 1.2 - 2.2   Bilirubin Total 0.3 0.0 - 1.2 mg/dL   Alkaline Phosphatase 118 44 - 121 IU/L   AST 20 0 - 40 IU/L   ALT 23 0 - 32 IU/L  Lipid panel     Status: Abnormal   Collection Time: 10/08/22  4:02 PM  Result Value Ref Range   Cholesterol, Total 196 100 - 199 mg/dL   Triglycerides 96 0 - 149 mg/dL   HDL 66 >39 mg/dL   VLDL Cholesterol Cal 17 5 - 40 mg/dL   LDL Chol Calc (NIH) 113 (H) 0 - 99 mg/dL   Chol/HDL Ratio 3.0 0.0 - 4.4 ratio    Comment:                                   T. Chol/HDL Ratio                                             Men  Women                               1/2 Avg.Risk  3.4    3.3                                   Avg.Risk  5.0    4.4                                2X Avg.Risk  9.6    7.1                                3X Avg.Risk 23.4   11.0       Assessment & Plan:   Problem List Items Addressed This Visit       Digestive   Gastroesophageal reflux disease   Relevant Medications   pantoprazole (PROTONIX) 40 MG tablet     Endocrine   Hypothyroidism - Primary   Relevant Medications   metoprolol tartrate (LOPRESSOR) 50 MG tablet     Other   Lymphedema   Pre-diabetes   Mood disorder ([place])    Return in about 4 weeks (around 11/08/2022).   Total time spent: 35 minutes  Evern Bio, NP  10/11/2022

## 2022-10-16 DIAGNOSIS — Z79899 Other long term (current) drug therapy: Secondary | ICD-10-CM | POA: Diagnosis not present

## 2022-10-16 DIAGNOSIS — G8929 Other chronic pain: Secondary | ICD-10-CM | POA: Diagnosis not present

## 2022-10-22 ENCOUNTER — Encounter: Payer: Self-pay | Admitting: Nurse Practitioner

## 2022-10-23 ENCOUNTER — Other Ambulatory Visit (INDEPENDENT_AMBULATORY_CARE_PROVIDER_SITE_OTHER): Payer: Self-pay | Admitting: Nurse Practitioner

## 2022-10-23 ENCOUNTER — Other Ambulatory Visit: Payer: Self-pay | Admitting: Nurse Practitioner

## 2022-10-23 DIAGNOSIS — B3731 Acute candidiasis of vulva and vagina: Secondary | ICD-10-CM

## 2022-10-23 DIAGNOSIS — M79604 Pain in right leg: Secondary | ICD-10-CM

## 2022-10-23 MED ORDER — NYSTATIN 100000 UNIT/GM EX POWD: 1.0000 | Freq: Three times a day (TID) | CUTANEOUS | 3 refills | Status: AC

## 2022-10-30 DIAGNOSIS — Z79899 Other long term (current) drug therapy: Secondary | ICD-10-CM | POA: Diagnosis not present

## 2022-10-30 DIAGNOSIS — G8929 Other chronic pain: Secondary | ICD-10-CM | POA: Diagnosis not present

## 2022-10-31 ENCOUNTER — Telehealth: Payer: Self-pay | Admitting: Urology

## 2022-10-31 ENCOUNTER — Ambulatory Visit: Payer: 59 | Admitting: Cardiology

## 2022-10-31 ENCOUNTER — Ambulatory Visit (INDEPENDENT_AMBULATORY_CARE_PROVIDER_SITE_OTHER): Payer: 59 | Admitting: Nurse Practitioner

## 2022-10-31 ENCOUNTER — Encounter: Payer: Self-pay | Admitting: Nurse Practitioner

## 2022-10-31 VITALS — BP 114/74 | HR 84 | Ht 64.0 in | Wt 242.6 lb

## 2022-10-31 DIAGNOSIS — I890 Lymphedema, not elsewhere classified: Secondary | ICD-10-CM

## 2022-10-31 DIAGNOSIS — R7303 Prediabetes: Secondary | ICD-10-CM | POA: Diagnosis not present

## 2022-10-31 DIAGNOSIS — E038 Other specified hypothyroidism: Secondary | ICD-10-CM

## 2022-10-31 DIAGNOSIS — F39 Unspecified mood [affective] disorder: Secondary | ICD-10-CM | POA: Diagnosis not present

## 2022-10-31 MED ORDER — MUPIROCIN 2 % EX OINT: 1.0000 | TOPICAL_OINTMENT | Freq: Two times a day (BID) | CUTANEOUS | 0 refills | Status: DC

## 2022-10-31 MED ORDER — DOXYCYCLINE HYCLATE 100 MG PO TABS: 100.0000 mg | ORAL_TABLET | Freq: Two times a day (BID) | ORAL | 0 refills | Status: DC

## 2022-10-31 NOTE — Progress Notes (Signed)
Established Patient Office Visit  Subjective:  Patient ID: Alexis Christensen, female    DOB: 1971-05-24  Age: 52 y.o. MRN: [id number]  Chief Complaint  Patient presents with   Follow-up    Follow up    Follow up.  Chin abscess to right side x 3 days.  Right first finger with pain at work.  Right hand inside at base of 4 th finger.     No other concerns at this time.   Past Medical History:  Diagnosis Date   Acute diastolic (congestive) heart failure    ADHD    Adopted    Anemia    Anxiety    Arthritis    Asthma    B12 deficiency    Bilateral swelling of feet and ankles    Bipolar 1 disorder    Bipolar disorder    Carpal tunnel syndrome 09/15/2014   Chest pain    CHF (congestive heart failure)    Dialostic CHF   Chronic diarrhea 09/07/2015   Chronic kidney disease    H/O KIDNEY STONES   Chronic venous insufficiency 04/26/2016   Depression    DVT (deep venous thrombosis) 2016   RIGHT LEG   Edema leg    Effusion of knee 12/30/2013   Family history of adverse reaction to anesthesia    ADOPTED   Food allergy    GERD (gastroesophageal reflux disease)    Goiter    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   Headache(784.0)    MIGRAINES   Heart burn    Heart murmur    History of blood clots    History of kidney stones    HLD (hyperlipidemia)    Hx MRSA infection    Hyperlipidemia    Hypothyroidism    Lipoma of arm 05/11/2013   Lower extremity edema    Lymphedema 04/26/2016   Other fatigue    Post traumatic stress disorder (PTSD)    Prediabetes    PVD (peripheral vascular disease)    Seasonal allergies    Shortness of breath    Shortness of breath on exertion    Stress incontinence    Thyroid disease    Urge incontinence    Vitamin D deficiency     Past Surgical History:  Procedure Laterality Date   ABDOMINAL HYSTERECTOMY     ANKLE SURGERY     CHOLECYSTECTOMY     COLONOSCOPY WITH PROPOFOL N/A 06/24/2019   Procedure: COLONOSCOPY WITH PROPOFOL;  Surgeon:  Toledo, Boykin Nearing, MD;  Location: [facility];  Service: Gastroenterology;  Laterality: N/A;   CYSTOSCOPY N/A 04/09/2016   Procedure: CYSTOSCOPY;  Surgeon: Alfredo Martinez, MD;  Location: [facility];  Service: Urology;  Laterality: N/A;   DILATION AND CURETTAGE OF UTERUS     ESOPHAGOGASTRODUODENOSCOPY (EGD) WITH PROPOFOL N/A 06/24/2019   Procedure: ESOPHAGOGASTRODUODENOSCOPY (EGD) WITH PROPOFOL;  Surgeon: Toledo, Boykin Nearing, MD;  Location: [facility];  Service: Gastroenterology;  Laterality: N/A;   INGUINAL HERNIA REPAIR Bilateral 04/18/2017   Procedure: LAPAROSCOPIC BILATERAL INGUINAL HERNIA REPAIR;  Surgeon: Leafy Ro, MD;  Location: [facility];  Service: General;  Laterality: Bilateral;   INTERSTIM IMPLANT PLACEMENT N/A 11/26/2016   Procedure: Leane Platt IMPLANT FIRST STAGE;  Surgeon: Alfredo Martinez, MD;  Location: [facility];  Service: Urology;  Laterality: N/A;   INTERSTIM IMPLANT PLACEMENT N/A 11/26/2016   Procedure: Leane Platt IMPLANT SECOND STAGE;  Surgeon: Alfredo Martinez, MD;  Location: [facility];  Service: Urology;  Laterality: N/A;   JOINT REPLACEMENT Right  knee   KNEE ARTHROSCOPY Bilateral    KNEE ARTHROSCOPY WITH LATERAL RELEASE Left 10/18/2015   Procedure: KNEE ARTHROSCOPY LATERAL AND PARTIAL SYNOVECTOMY;  Surgeon: Kennedy Bucker, MD;  Location: [facility];  Service: Orthopedics;  Laterality: Left;   Lymph Node removal  2015   Neck   PUBOVAGINAL SLING N/A 04/09/2016   Procedure: PUBO-VAGINAL SLING/ RETROPUBIC SLING;  Surgeon: Alfredo Martinez, MD;  Location: [facility];  Service: Urology;  Laterality: N/A;   REPLACEMENT TOTAL KNEE Right    SHOULDER SURGERY Right 2014   TUBAL LIGATION     WRIST SURGERY Right    metal plate    Social History   Socioeconomic History   Marital status: Divorced    Spouse name: Not on file   Number of children: Not on file   Years of education: Not on file   Highest education level: Not on file  Occupational History   Occupation: Customer  Service    Employer: [employer]  Tobacco Use   Smoking status: Former    Packs/day: 0.50    Years: 10.00    Additional pack years: 0.00    Total pack years: 5.00    Types: Cigarettes    Start date: 07/16/2008    Quit date: 07/16/2019    Years since quitting: 3.2    Passive exposure: Past   Smokeless tobacco: Former   Tobacco comments:    STRESS RELATED  Vaping Use   Vaping Use: Never used  Substance and Sexual Activity   Alcohol use: Not Currently    Comment: Hisotry of ETOH abouse ASB patient   Drug use: Yes    Types: Other-see comments    Comment: Pt listed history of abuse of "prescription drugs"   Sexual activity: Not Currently  Other Topics Concern   Not on file  Social History Narrative   Not on file   Social Determinants of Health   Financial Resource Strain: Not on file  Food Insecurity: Not on file  Transportation Needs: Not on file  Physical Activity: Not on file  Stress: Not on file  Social Connections: Not on file  Intimate Partner Violence: Not on file    Family History  Adopted: Yes  Family history unknown: Yes    Allergies  Allergen Reactions   Geodon [Ziprasidone Hydrochloride] Other (See Comments)    Numbness ,sob, headaches, blurred vision   Sulfacetamide Sodium Hives   Ziprasidone Anaphylaxis    Other reaction(s): Other (See Comments) Numbness ,sob, headaches, blurred vision  Other reaction(s): Other (See Comments), Unknown Numbness ,sob, headaches, blurred vision Numbness ,sob, headaches, blurred vision  Other reaction(s): Other (See Comments), Unknown Numbness ,sob, headaches, blurred vision Numbness ,sob, headaches, blurred vision   Numbness ,sob, headaches, blurred vision Other reaction(s): Other (See Comments), Unknown Numbness ,sob, headaches, blurred vision Numbness ,sob, headaches, blurred vision Other reaction(s): Other (See Comments), Unknown Numbness ,sob, headaches, blurred vision Numbness ,sob, headaches, blurred  vision   Ziprasidone Hcl Anaphylaxis and Other (See Comments)    Other reaction(s): Other (See Comments), Unknown Numbness ,sob, headaches, blurred vision Numbness ,sob, headaches, blurred vision   Ace Inhibitors Rash   Erythromycin Rash and Hives   Hydromorphone Rash   Lamictal [Lamotrigine] Rash    Other reaction(s): Unknown   Strawberry (Diagnostic) Rash   Sulfa Antibiotics Hives and Rash    Other reaction(s): Unknown Other reaction(s): Unknown Other reaction(s): Unknown    Review of Systems  Constitutional: Negative.   HENT: Negative.    Eyes: Negative.  Respiratory: Negative.    Cardiovascular: Negative.   Gastrointestinal: Negative.   Genitourinary: Negative.   Musculoskeletal:  Positive for joint pain.  Skin: Negative.   Neurological: Negative.   Endo/Heme/Allergies: Negative.   Psychiatric/Behavioral:  Positive for depression.        Objective:   BP 114/74   Pulse 84   Ht 5\' 4"  (1.626 m)   Wt 242 lb 9.6 oz (110 kg)   SpO2 93%   BMI 41.64 kg/m   Vitals:   10/31/22 0938  BP: 114/74  Pulse: 84  Height: 5\' 4"  (1.626 m)  Weight: 242 lb 9.6 oz (110 kg)  SpO2: 93%  BMI (Calculated): 41.62    Physical Exam Vitals reviewed.  Constitutional:      Appearance: Normal appearance.  HENT:     Head: Normocephalic.     Nose: Nose normal.     Mouth/Throat:     Mouth: Mucous membranes are moist.  Eyes:     Pupils: Pupils are equal, round, and reactive to light.  Cardiovascular:     Rate and Rhythm: Normal rate and regular rhythm.  Pulmonary:     Effort: Pulmonary effort is normal.     Breath sounds: Normal breath sounds.  Abdominal:     General: Bowel sounds are normal.     Palpations: Abdomen is soft.  Musculoskeletal:        General: Normal range of motion.     Cervical back: Normal range of motion and neck supple.  Skin:    General: Skin is warm and dry.     Findings: Lesion present.  Neurological:     Mental Status: She is alert and oriented  to person, place, and time.  Psychiatric:        Mood and Affect: Mood normal.        Behavior: Behavior normal.      No results found for any visits on 10/31/22.  Recent Results (from the past 2160 hour(s))  Urinalysis, Complete     Status: Abnormal   Collection Time: 09/17/22  8:23 AM  Result Value Ref Range   Specific Gravity, UA 1.025 1.005 - 1.030   pH, UA 5.5 5.0 - 7.5   Color, UA Yellow Yellow   Appearance Ur Hazy (A) Clear   Leukocytes,UA 2+ (A) Negative   Protein,UA Negative Negative/Trace   Glucose, UA Negative Negative   Ketones, UA Negative Negative   RBC, UA Trace (A) Negative   Bilirubin, UA Negative Negative   Urobilinogen, Ur 0.2 0.2 - 1.0 mg/dL   Nitrite, UA Negative Negative   Microscopic Examination See below:   Microscopic Examination     Status: Abnormal   Collection Time: 09/17/22  8:23 AM   Urine  Result Value Ref Range   WBC, UA >30 (A) 0 - 5 /hpf   RBC, Urine 3-10 (A) 0 - 2 /hpf   Epithelial Cells (non renal) 0-10 0 - 10 /hpf   Renal Epithel, UA 0-10 (A) None seen /hpf   Casts Present (A) None seen /lpf   Cast Type Hyaline casts N/A   Mucus, UA Present (A) Not Estab.   Bacteria, UA Many (A) None seen/Few  CULTURE, URINE COMPREHENSIVE     Status: None   Collection Time: 09/17/22  8:40 AM   Specimen: Urine   UR  Result Value Ref Range   Urine Culture, Comprehensive Final report    Organism ID, Bacteria Comment     Comment: Mixed urogenital flora 10,000-25,000 colony  forming units per mL   Urinalysis, Complete     Status: Abnormal   Collection Time: 10/01/22  9:20 AM  Result Value Ref Range   Specific Gravity, UA >1.030 (H) 1.005 - 1.030   pH, UA 5.5 5.0 - 7.5   Color, UA Yellow Yellow   Appearance Ur Cloudy (A) Clear   Leukocytes,UA 1+ (A) Negative   Protein,UA 1+ (A) Negative/Trace   Glucose, UA Negative Negative   Ketones, UA Negative Negative   RBC, UA Trace (A) Negative   Bilirubin, UA Negative Negative   Urobilinogen, Ur 0.2  0.2 - 1.0 mg/dL   Nitrite, UA Negative Negative   Microscopic Examination See below:   Microscopic Examination     Status: Abnormal   Collection Time: 10/01/22  9:20 AM   Urine  Result Value Ref Range   WBC, UA >30 (A) 0 - 5 /hpf   RBC, Urine 3-10 (A) 0 - 2 /hpf   Epithelial Cells (non renal) >10 (A) 0 - 10 /hpf   Mucus, UA Present (A) Not Estab.   Bacteria, UA Many (A) None seen/Few  CBC With Differential     Status: None   Collection Time: 10/08/22  4:02 PM  Result Value Ref Range   WBC 8.9 3.4 - 10.8 x10E3/uL   RBC 3.95 3.77 - 5.28 x10E6/uL   Hemoglobin 11.4 11.1 - 15.9 g/dL   Hematocrit 16.1 09.6 - 46.6 %   MCV 87 79 - 97 fL   MCH 28.9 26.6 - 33.0 pg   MCHC 33.1 31.5 - 35.7 g/dL   RDW 04.5 40.9 - 81.1 %   Neutrophils 59 Not Estab. %   Lymphs 29 Not Estab. %   Monocytes 8 Not Estab. %   Eos 3 Not Estab. %   Basos 1 Not Estab. %   Neutrophils Absolute 5.3 1.4 - 7.0 x10E3/uL   Lymphocytes Absolute 2.6 0.7 - 3.1 x10E3/uL   Monocytes Absolute 0.7 0.1 - 0.9 x10E3/uL   EOS (ABSOLUTE) 0.2 0.0 - 0.4 x10E3/uL   Basophils Absolute 0.1 0.0 - 0.2 x10E3/uL   Immature Granulocytes 0 Not Estab. %   Immature Grans (Abs) 0.0 0.0 - 0.1 x10E3/uL  Hemoglobin A1c     Status: Abnormal   Collection Time: 10/08/22  4:02 PM  Result Value Ref Range   Hgb A1c MFr Bld 6.0 (H) 4.8 - 5.6 %    Comment:          Prediabetes: 5.7 - 6.4          Diabetes: >6.4          Glycemic control for adults with diabetes: <7.0    Est. average glucose Bld gHb Est-mCnc 126 mg/dL  TSH     Status: Abnormal   Collection Time: 10/08/22  4:02 PM  Result Value Ref Range   TSH 15.200 (H) 0.450 - 4.500 uIU/mL  CMP14+EGFR     Status: None   Collection Time: 10/08/22  4:02 PM  Result Value Ref Range   Glucose 94 70 - 99 mg/dL   BUN 12 6 - 24 mg/dL   Creatinine, Ser 9.14 0.57 - 1.00 mg/dL   eGFR 782 >95 AO/ZHY/8.65   BUN/Creatinine Ratio 18 9 - 23   Sodium 141 134 - 144 mmol/L   Potassium 4.2 3.5 - 5.2 mmol/L    Chloride 101 96 - 106 mmol/L   CO2 27 20 - 29 mmol/L   Calcium 9.1 8.7 - 10.2 mg/dL   Total Protein  6.3 6.0 - 8.5 g/dL   Albumin 3.9 3.8 - 4.9 g/dL   Globulin, Total 2.4 1.5 - 4.5 g/dL   Albumin/Globulin Ratio 1.6 1.2 - 2.2   Bilirubin Total 0.3 0.0 - 1.2 mg/dL   Alkaline Phosphatase 118 44 - 121 IU/L   AST 20 0 - 40 IU/L   ALT 23 0 - 32 IU/L  Lipid panel     Status: Abnormal   Collection Time: 10/08/22  4:02 PM  Result Value Ref Range   Cholesterol, Total 196 100 - 199 mg/dL   Triglycerides 96 0 - 149 mg/dL   HDL 66 >69 mg/dL   VLDL Cholesterol Cal 17 5 - 40 mg/dL   LDL Chol Calc (NIH) 629 (H) 0 - 99 mg/dL   Chol/HDL Ratio 3.0 0.0 - 4.4 ratio    Comment:                                   T. Chol/HDL Ratio                                             Men  Women                               1/2 Avg.Risk  3.4    3.3                                   Avg.Risk  5.0    4.4                                2X Avg.Risk  9.6    7.1                                3X Avg.Risk 23.4   11.0       Assessment & Plan:   Problem List Items Addressed This Visit       Endocrine   Hypothyroidism - Primary     Other   Lymphedema   Pre-diabetes   Mood disorder    No follow-ups on file.   Total time spent: 35 minutes  Orson Eva, NP  10/31/2022

## 2022-10-31 NOTE — Telephone Encounter (Signed)
Pt called and stated that when the rep from [employer] checked the pt battery life, the rep stated that it has approx 12-15 months left on the battery  for the [employer] device.  If the pt needed an MRI, pt needs to talk to Dr. Sherron Monday regarding changing out the battery in the interstem so the pt can have an MRI done. Please advise. [phone]

## 2022-10-31 NOTE — Patient Instructions (Addendum)
1) Cold compresses to right hand, acetaminophen 2) Warm compress to acne on chin 3) Mupirocin and doxcycline 4) Follow up appt in 2 weeks

## 2022-10-31 NOTE — Progress Notes (Unsigned)
MRN : [id number]  Alexis Christensen is a 52 y.o. ([birth year]) female who presents with chief complaint of legs swell.  History of Present Illness: ***  No outpatient medications have been marked as taking for the 11/01/22 encounter (Appointment) with [name], [name], MD.    Past Medical History:  Diagnosis Date   Acute diastolic (congestive) heart failure    ADHD    Adopted    Anemia    Anxiety    Arthritis    Asthma    B12 deficiency    Bilateral swelling of feet and ankles    Bipolar 1 disorder    Bipolar disorder    Carpal tunnel syndrome 09/15/2014   Chest pain    CHF (congestive heart failure)    Dialostic CHF   Chronic diarrhea 09/07/2015   Chronic kidney disease    H/O KIDNEY STONES   Chronic venous insufficiency 04/26/2016   Depression    DVT (deep venous thrombosis) 2016   RIGHT LEG   Edema leg    Effusion of knee 12/30/2013   Family history of adverse reaction to anesthesia    ADOPTED   Food allergy    GERD (gastroesophageal reflux disease)    Goiter    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   Headache(784.0)    MIGRAINES   Heart burn    Heart murmur    History of blood clots    History of kidney stones    HLD (hyperlipidemia)    Hx MRSA infection    Hyperlipidemia    Hypothyroidism    Lipoma of arm 05/11/2013   Lower extremity edema    Lymphedema 04/26/2016   Other fatigue    Post traumatic stress disorder (PTSD)    Prediabetes    PVD (peripheral vascular disease)    Seasonal allergies    Shortness of breath    Shortness of breath on exertion    Stress incontinence    Thyroid disease    Urge incontinence    Vitamin D deficiency     Past Surgical History:  Procedure Laterality Date   ABDOMINAL HYSTERECTOMY     ANKLE SURGERY     CHOLECYSTECTOMY     COLONOSCOPY WITH PROPOFOL N/A 06/24/2019   Procedure: COLONOSCOPY WITH PROPOFOL;  Surgeon: Toledo, Boykin Nearing, MD;  Location: [facility];  Service: Gastroenterology;  Laterality: N/A;    CYSTOSCOPY N/A 04/09/2016   Procedure: CYSTOSCOPY;  Surgeon: Alfredo Martinez, MD;  Location: [facility];  Service: Urology;  Laterality: N/A;   DILATION AND CURETTAGE OF UTERUS     ESOPHAGOGASTRODUODENOSCOPY (EGD) WITH PROPOFOL N/A 06/24/2019   Procedure: ESOPHAGOGASTRODUODENOSCOPY (EGD) WITH PROPOFOL;  Surgeon: Toledo, Boykin Nearing, MD;  Location: [facility];  Service: Gastroenterology;  Laterality: N/A;   INGUINAL HERNIA REPAIR Bilateral 04/18/2017   Procedure: LAPAROSCOPIC BILATERAL INGUINAL HERNIA REPAIR;  Surgeon: Leafy Ro, MD;  Location: [facility];  Service: General;  Laterality: Bilateral;   INTERSTIM IMPLANT PLACEMENT N/A 11/26/2016   Procedure: Leane Platt IMPLANT FIRST STAGE;  Surgeon: Alfredo Martinez, MD;  Location: [facility];  Service: Urology;  Laterality: N/A;   INTERSTIM IMPLANT PLACEMENT N/A 11/26/2016   Procedure: Leane Platt IMPLANT SECOND STAGE;  Surgeon: Alfredo Martinez, MD;  Location: [facility];  Service: Urology;  Laterality: N/A;   JOINT REPLACEMENT Right    knee   KNEE ARTHROSCOPY Bilateral    KNEE ARTHROSCOPY WITH LATERAL RELEASE Left 10/18/2015   Procedure: KNEE ARTHROSCOPY LATERAL AND PARTIAL SYNOVECTOMY;  Surgeon: Kennedy Bucker, MD;  Location: [facility];  Service: Orthopedics;  Laterality: Left;   Lymph Node removal  2015   Neck   PUBOVAGINAL SLING N/A 04/09/2016   Procedure: PUBO-VAGINAL SLING/ RETROPUBIC SLING;  Surgeon: Alfredo Martinez, MD;  Location: [facility];  Service: Urology;  Laterality: N/A;   REPLACEMENT TOTAL KNEE Right    SHOULDER SURGERY Right 2014   TUBAL LIGATION     WRIST SURGERY Right    metal plate    Social History Social History   Tobacco Use   Smoking status: Former    Packs/day: 0.50    Years: 10.00    Additional pack years: 0.00    Total pack years: 5.00    Types: Cigarettes    Start date: 07/16/2008    Quit date: 07/16/2019    Years since quitting: 3.2    Passive exposure: Past   Smokeless tobacco: Former   Tobacco comments:     STRESS RELATED  Vaping Use   Vaping Use: Never used  Substance Use Topics   Alcohol use: Not Currently    Comment: Hisotry of ETOH abouse ASB patient   Drug use: Yes    Types: Other-see comments    Comment: Pt listed history of abuse of "prescription drugs"    Family History Family History  Adopted: Yes  Family history unknown: Yes    Allergies  Allergen Reactions   Geodon [Ziprasidone Hydrochloride] Other (See Comments)    Numbness ,sob, headaches, blurred vision   Sulfacetamide Sodium Hives   Ziprasidone Anaphylaxis    Other reaction(s): Other (See Comments) Numbness ,sob, headaches, blurred vision  Other reaction(s): Other (See Comments), Unknown Numbness ,sob, headaches, blurred vision Numbness ,sob, headaches, blurred vision  Other reaction(s): Other (See Comments), Unknown Numbness ,sob, headaches, blurred vision Numbness ,sob, headaches, blurred vision   Numbness ,sob, headaches, blurred vision Other reaction(s): Other (See Comments), Unknown Numbness ,sob, headaches, blurred vision Numbness ,sob, headaches, blurred vision Other reaction(s): Other (See Comments), Unknown Numbness ,sob, headaches, blurred vision Numbness ,sob, headaches, blurred vision   Ziprasidone Hcl Anaphylaxis and Other (See Comments)    Other reaction(s): Other (See Comments), Unknown Numbness ,sob, headaches, blurred vision Numbness ,sob, headaches, blurred vision   Ace Inhibitors Rash   Erythromycin Rash and Hives   Hydromorphone Rash   Lamictal [Lamotrigine] Rash    Other reaction(s): Unknown   Strawberry (Diagnostic) Rash   Sulfa Antibiotics Hives and Rash    Other reaction(s): Unknown Other reaction(s): Unknown Other reaction(s): Unknown     REVIEW OF SYSTEMS (Negative unless checked)  Constitutional: [] Weight loss  [] Fever  [] Chills Cardiac: [] Chest pain   [] Chest pressure   [] Palpitations   [] Shortness of breath when laying flat   [] Shortness of breath with  exertion. Vascular:  [] Pain in legs with walking   [x] Pain in legs with standing  [] History of DVT   [] Phlebitis   [x] Swelling in legs   [] Varicose veins   [] Non-healing ulcers Pulmonary:   [] Uses home oxygen   [] Productive cough   [] Hemoptysis   [] Wheeze  [] COPD   [] Asthma Neurologic:  [] Dizziness   [] Seizures   [] History of stroke   [] History of TIA  [] Aphasia   [] Vissual changes   [] Weakness or numbness in arm   [] Weakness or numbness in leg Musculoskeletal:   [] Joint swelling   [] Joint pain   [] Low back pain Hematologic:  [] Easy bruising  [] Easy bleeding   [] Hypercoagulable state   [] Anemic Gastrointestinal:  [] Diarrhea   [] Vomiting  [] Gastroesophageal reflux/heartburn   [] Difficulty swallowing.  Genitourinary:  Chronic kidney disease   Difficult urination  Frequent urination   Blood in urine Skin:  Rashes   Ulcers  Psychological:  History of anxiety    History of major depression.  Physical Examination  There were no vitals filed for this visit. There is no height or weight on file to calculate BMI. Gen: WD/WN, NAD Head: [place]/AT, No temporalis wasting.  Ear/Nose/Throat: Hearing grossly intact, nares w/o erythema or drainage, pinna without lesions Eyes: PER, EOMI, sclera nonicteric.  Neck: Supple, no gross masses.  No JVD.  Pulmonary:  Good air movement, no audible wheezing, no use of accessory muscles.  Cardiac: RRR, precordium not hyperdynamic. Vascular:  scattered varicosities present bilaterally.  Mild venous stasis changes to the legs bilaterally.  3-4+ soft pitting edema, CEAP [id number]  Vessel Right Left  Radial Palpable Palpable  Gastrointestinal: soft, non-distended. No guarding/no peritoneal signs.  Musculoskeletal: M/S 5/5 throughout.  No deformity.  Neurologic: CN 2-12 intact. Pain and light touch intact in extremities.  Symmetrical.  Speech is fluent. Motor exam as listed above. Psychiatric: Judgment intact, Mood & affect appropriate for pt's clinical  situation. Dermatologic: Venous rashes no ulcers noted.  No changes consistent with cellulitis. Lymph : No lichenification or skin changes of chronic lymphedema.  CBC Lab Results  Component Value Date   WBC 8.9 10/08/2022   HGB 11.4 10/08/2022   HCT 34.4 10/08/2022   MCV 87 10/08/2022   PLT 323 06/13/2022    BMET    Component Value Date/Time   NA 141 10/08/2022 1602   NA 138 11/07/2012 0517   K 4.2 10/08/2022 1602   K 3.9 11/07/2012 0517   CL 101 10/08/2022 1602   CL 109 (H) 11/07/2012 0517   CO2 27 10/08/2022 1602   CO2 25 11/07/2012 0517   GLUCOSE 94 10/08/2022 1602   GLUCOSE 91 02/25/2022 1435   GLUCOSE 91 11/07/2012 0517   BUN 12 10/08/2022 1602   BUN 7 11/07/2012 0517   CREATININE 0.67 10/08/2022 1602   CREATININE 0.76 11/07/2012 0517   CALCIUM 9.1 10/08/2022 1602   CALCIUM 7.9 (L) 11/07/2012 0517   GFRNONAA >60 02/25/2022 1435   GFRNONAA >60 11/07/2012 0517   GFRAA >60 02/17/2020 0000   GFRAA >60 11/07/2012 0517   CrCl cannot be calculated (Patient's most recent lab result is older than the maximum 21 days allowed.).  COAG Lab Results  Component Value Date   INR 1.03 04/03/2016    Radiology No results found.   Assessment/Plan There are no diagnoses linked to this encounter.   Levora Dredge, MD  10/31/2022 6:06 PM

## 2022-11-01 ENCOUNTER — Ambulatory Visit (INDEPENDENT_AMBULATORY_CARE_PROVIDER_SITE_OTHER): Payer: 59 | Admitting: Vascular Surgery

## 2022-11-01 ENCOUNTER — Ambulatory Visit (INDEPENDENT_AMBULATORY_CARE_PROVIDER_SITE_OTHER): Payer: Medicare Other

## 2022-11-01 ENCOUNTER — Encounter (INDEPENDENT_AMBULATORY_CARE_PROVIDER_SITE_OTHER): Payer: Self-pay | Admitting: Vascular Surgery

## 2022-11-01 VITALS — BP 111/71 | HR 71 | Resp 16 | Wt 243.2 lb

## 2022-11-01 DIAGNOSIS — I5030 Unspecified diastolic (congestive) heart failure: Secondary | ICD-10-CM | POA: Diagnosis not present

## 2022-11-01 DIAGNOSIS — M79605 Pain in left leg: Secondary | ICD-10-CM

## 2022-11-01 DIAGNOSIS — M79604 Pain in right leg: Secondary | ICD-10-CM

## 2022-11-01 DIAGNOSIS — I890 Lymphedema, not elsewhere classified: Secondary | ICD-10-CM

## 2022-11-01 DIAGNOSIS — K219 Gastro-esophageal reflux disease without esophagitis: Secondary | ICD-10-CM

## 2022-11-01 DIAGNOSIS — I872 Venous insufficiency (chronic) (peripheral): Secondary | ICD-10-CM | POA: Diagnosis not present

## 2022-11-01 NOTE — Telephone Encounter (Signed)
Called pt to get clarification, pt states her PCP has been trying to get MRI of patient's back for 2 years. Pt is asking if she can have her interstim battery replaced and interstim updated so she may have the MRI?

## 2022-11-02 ENCOUNTER — Ambulatory Visit (INDEPENDENT_AMBULATORY_CARE_PROVIDER_SITE_OTHER): Payer: 59 | Admitting: Cardiovascular Disease

## 2022-11-02 ENCOUNTER — Other Ambulatory Visit: Payer: Self-pay | Admitting: Nurse Practitioner

## 2022-11-02 ENCOUNTER — Telehealth: Payer: Self-pay

## 2022-11-02 ENCOUNTER — Encounter: Payer: Self-pay | Admitting: Cardiovascular Disease

## 2022-11-02 VITALS — BP 122/76 | HR 88 | Ht 64.0 in | Wt 245.6 lb

## 2022-11-02 DIAGNOSIS — I5030 Unspecified diastolic (congestive) heart failure: Secondary | ICD-10-CM | POA: Diagnosis not present

## 2022-11-02 DIAGNOSIS — E782 Mixed hyperlipidemia: Secondary | ICD-10-CM | POA: Diagnosis not present

## 2022-11-02 DIAGNOSIS — R7303 Prediabetes: Secondary | ICD-10-CM | POA: Diagnosis not present

## 2022-11-02 DIAGNOSIS — I10 Essential (primary) hypertension: Secondary | ICD-10-CM | POA: Diagnosis not present

## 2022-11-02 DIAGNOSIS — I872 Venous insufficiency (chronic) (peripheral): Secondary | ICD-10-CM

## 2022-11-02 DIAGNOSIS — I340 Nonrheumatic mitral (valve) insufficiency: Secondary | ICD-10-CM | POA: Diagnosis not present

## 2022-11-02 DIAGNOSIS — K219 Gastro-esophageal reflux disease without esophagitis: Secondary | ICD-10-CM

## 2022-11-02 NOTE — Progress Notes (Signed)
Cardiology Office Note   Date:  11/02/2022   ID:  Alexis Christensen, DOB [birth year], MRN [id number]  PCP:  Margaretann Loveless, MD  Cardiologist:  Adrian Blackwater, MD      History of Present Illness: Alexis Christensen is a 52 y.o. female who presents for  Chief Complaint  Patient presents with   Follow-up    4 month follow up    HPI    Past Medical History:  Diagnosis Date   Acute diastolic (congestive) heart failure    ADHD    Adopted    Anemia    Anxiety    Arthritis    Asthma    B12 deficiency    Bilateral swelling of feet and ankles    Bipolar 1 disorder    Bipolar disorder    Carpal tunnel syndrome 09/15/2014   Chest pain    CHF (congestive heart failure)    Dialostic CHF   Chronic diarrhea 09/07/2015   Chronic kidney disease    H/O KIDNEY STONES   Chronic venous insufficiency 04/26/2016   Depression    DVT (deep venous thrombosis) 2016   RIGHT LEG   Edema leg    Effusion of knee 12/30/2013   Family history of adverse reaction to anesthesia    ADOPTED   Food allergy    GERD (gastroesophageal reflux disease)    Goiter    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   Headache(784.0)    MIGRAINES   Heart burn    Heart murmur    History of blood clots    History of kidney stones    HLD (hyperlipidemia)    Hx MRSA infection    Hyperlipidemia    Hypothyroidism    Lipoma of arm 05/11/2013   Lower extremity edema    Lymphedema 04/26/2016   Other fatigue    Post traumatic stress disorder (PTSD)    Prediabetes    PVD (peripheral vascular disease)    Seasonal allergies    Shortness of breath    Shortness of breath on exertion    Stress incontinence    Thyroid disease    Urge incontinence    Vitamin D deficiency      Past Surgical History:  Procedure Laterality Date   ABDOMINAL HYSTERECTOMY     ANKLE SURGERY     CHOLECYSTECTOMY     COLONOSCOPY WITH PROPOFOL N/A 06/24/2019   Procedure: COLONOSCOPY WITH PROPOFOL;  Surgeon: Toledo, Boykin Nearing, MD;  Location:  [facility];  Service: Gastroenterology;  Laterality: N/A;   CYSTOSCOPY N/A 04/09/2016   Procedure: CYSTOSCOPY;  Surgeon: Alfredo Martinez, MD;  Location: [facility];  Service: Urology;  Laterality: N/A;   DILATION AND CURETTAGE OF UTERUS     ESOPHAGOGASTRODUODENOSCOPY (EGD) WITH PROPOFOL N/A 06/24/2019   Procedure: ESOPHAGOGASTRODUODENOSCOPY (EGD) WITH PROPOFOL;  Surgeon: Toledo, Boykin Nearing, MD;  Location: [facility];  Service: Gastroenterology;  Laterality: N/A;   INGUINAL HERNIA REPAIR Bilateral 04/18/2017   Procedure: LAPAROSCOPIC BILATERAL INGUINAL HERNIA REPAIR;  Surgeon: Leafy Ro, MD;  Location: [facility];  Service: General;  Laterality: Bilateral;   INTERSTIM IMPLANT PLACEMENT N/A 11/26/2016   Procedure: Leane Platt IMPLANT FIRST STAGE;  Surgeon: Alfredo Martinez, MD;  Location: [facility];  Service: Urology;  Laterality: N/A;   INTERSTIM IMPLANT PLACEMENT N/A 11/26/2016   Procedure: Leane Platt IMPLANT SECOND STAGE;  Surgeon: Alfredo Martinez, MD;  Location: [facility];  Service: Urology;  Laterality: N/A;   JOINT REPLACEMENT Right    knee   KNEE  ARTHROSCOPY Bilateral    KNEE ARTHROSCOPY WITH LATERAL RELEASE Left 10/18/2015   Procedure: KNEE ARTHROSCOPY LATERAL AND PARTIAL SYNOVECTOMY;  Surgeon: Kennedy Bucker, MD;  Location: [facility];  Service: Orthopedics;  Laterality: Left;   Lymph Node removal  2015   Neck   PUBOVAGINAL SLING N/A 04/09/2016   Procedure: PUBO-VAGINAL SLING/ RETROPUBIC SLING;  Surgeon: Alfredo Martinez, MD;  Location: [facility];  Service: Urology;  Laterality: N/A;   REPLACEMENT TOTAL KNEE Right    SHOULDER SURGERY Right 2014   TUBAL LIGATION     WRIST SURGERY Right    metal plate     Current Outpatient Medications  Medication Sig Dispense Refill   albuterol (VENTOLIN HFA) 108 (90 Base) MCG/ACT inhaler Inhale 1 puff into the lungs every 6 (six) hours as needed for wheezing or shortness of breath.     ARIPiprazole (ABILIFY) 30 MG tablet Take 30 mg by mouth daily.      aspirin 81 MG chewable tablet Aspirin 81 MG Oral Tablet Chewable QTY: 30 tablet Days: 30 Refills: 0  Written: 01/23/21 Patient Instructions: once a day     Budeson-Glycopyrrol-Formoterol (BREZTRI AEROSPHERE) 160-9-4.8 MCG/ACT AERO Inhale 2 puffs into the lungs in the morning and at bedtime.     celecoxib (CELEBREX) 100 MG capsule Take by mouth.     colestipol (COLESTID) 1 g tablet Take 2 g by mouth 2 (two) times daily.     cyanocobalamin (VITAMIN B12) 500 MCG tablet Take 1 tablet (500 mcg total) by mouth daily. 90 tablet 0   dicyclomine (BENTYL) 10 MG capsule TAKE 1 CAPSULE BY MOUTH EVERY 8 HOURS AS NEEDED FOR ABDOMINAL PAIN 270 capsule 3   diphenoxylate-atropine (LOMOTIL) 2.5-0.025 MG tablet Take 1 tablet by mouth 4 (four) times daily as needed for diarrhea or loose stools. 30 tablet 1   doxycycline (VIBRA-TABS) 100 MG tablet Take 1 tablet (100 mg total) by mouth 2 (two) times daily. 14 tablet 0   DULoxetine (CYMBALTA) 60 MG capsule Take 60 mg by mouth 2 (two) times daily.     Evolocumab (REPATHA SURECLICK) 140 MG/ML SOAJ Inject into the skin.     furosemide (LASIX) 40 MG tablet TAKE 1 TABLET BY MOUTH EVERY DAY 90 tablet 0   gabapentin (NEURONTIN) 600 MG tablet Take 600 mg by mouth 2 (two) times daily.      hydrOXYzine (ATARAX/VISTARIL) 25 MG tablet Take 50 mg by mouth every 8 (eight) hours as needed for itching.     KLOR-CON M10 10 MEQ tablet Take 10 mEq by mouth daily.     levothyroxine (SYNTHROID) 200 MCG tablet PLEASE SEE ATTACHED FOR DETAILED DIRECTIONS     liothyronine (CYTOMEL) 5 MCG tablet Take 5 mcg by mouth daily.     lisinopril (ZESTRIL) 5 MG tablet Take 5 mg by mouth daily.     metFORMIN (GLUCOPHAGE) 500 MG tablet Take 1 tablet (500 mg total) by mouth 3 (three) times daily. 90 tablet 0   metoprolol tartrate (LOPRESSOR) 50 MG tablet Take 50 mg by mouth 2 (two) times daily.     montelukast (SINGULAIR) 10 MG tablet Take 10 mg by mouth daily.     mupirocin ointment (BACTROBAN) 2 %  Apply 1 Application topically 2 (two) times daily. 22 g 0   nortriptyline (PAMELOR) 10 MG capsule Take 10 mg in the morning     nystatin powder Apply 1 Application topically 3 (three) times daily. 15 g 3   ondansetron (ZOFRAN) 4 MG tablet Take 4 mg by  mouth every 8 (eight) hours as needed.     pantoprazole (PROTONIX) 40 MG tablet Take 1 tablet (40 mg total) by mouth 2 (two) times daily. 60 tablet 0   rosuvastatin (CRESTOR) 40 MG tablet Take 40 mg by mouth daily.     Semaglutide (RYBELSUS) 3 MG TABS Take 1 tablet (3 mg total) by mouth daily. 30 tablet 0   spironolactone (ALDACTONE) 25 MG tablet TAKE 1/2 (HALF) TABLET BY MOUTH ONCE DAILY. 45 tablet 1   SUBOXONE 8-2 MG FILM Dissolve 1 film under tongue twice daily  0   Vitamin D, Ergocalciferol, (DRISDOL) 1.25 MG (50000 UNIT) CAPS capsule 1 po q wed and 1 po q sun 8 capsule 0   benzonatate (TESSALON PERLES) 100 MG capsule Take 1 capsule (100 mg total) by mouth 3 (three) times daily as needed for cough. (Patient not taking: Reported on 10/31/2022) 30 capsule 1   No current facility-administered medications for this visit.    Allergies:   Geodon [ziprasidone hydrochloride], Sulfacetamide sodium, Ziprasidone, Ziprasidone hcl, Ace inhibitors, Erythromycin, Hydromorphone, Lamictal [lamotrigine], Strawberry (diagnostic), and Sulfa antibiotics    Social History:   reports that she quit smoking about 3 years ago. Her smoking use included cigarettes. She started smoking about 14 years ago. She has a 5.00 pack-year smoking history. She has been exposed to tobacco smoke. She has quit using smokeless tobacco. She reports that she does not currently use alcohol. She reports current drug use. Drug: Other-see comments.   Family History:  She was adopted. Family history is unknown by patient.    ROS:     Review of Systems  Constitutional: Negative.   HENT: Negative.    Eyes: Negative.   Respiratory: Negative.    Gastrointestinal: Negative.   Genitourinary:  Negative.   Musculoskeletal: Negative.   Skin: Negative.   Neurological: Negative.   Endo/Heme/Allergies: Negative.   Psychiatric/Behavioral: Negative.    All other systems reviewed and are negative.     All other systems are reviewed and negative.    PHYSICAL EXAM: VS:  BP 122/76   Pulse 88   Ht  (1.626 m)   Wt 245 lb 9.6 oz (111.4 kg)   SpO2 93%   BMI 42.16 kg/m  , BMI Body mass index is 42.16 kg/m. Last weight:  Wt Readings from Last 3 Encounters:  11/02/22 245 lb 9.6 oz (111.4 kg)  11/01/22 243 lb 3.2 oz (110.3 kg)  10/31/22 242 lb 9.6 oz (110 kg)     Physical Exam Constitutional:      Appearance: Normal appearance.  Cardiovascular:     Rate and Rhythm: Normal rate and regular rhythm.     Heart sounds: Normal heart sounds.  Pulmonary:     Effort: Pulmonary effort is normal.     Breath sounds: Normal breath sounds.  Musculoskeletal:     Right lower leg: No edema.     Left lower leg: No edema.  Neurological:     Mental Status: She is alert.       EKG:   Recent Labs: 02/25/2022: B Natriuretic Peptide 19.3 06/13/2022: Platelets 323 10/08/2022: ALT 23; BUN 12; Creatinine, Ser 0.67; Hemoglobin 11.4; Potassium 4.2; Sodium 141; TSH 15.200    Lipid Panel    Component Value Date/Time   CHOL 196 10/08/2022 1602   TRIG 96 10/08/2022 1602   HDL 66 10/08/2022 1602   CHOLHDL 3.0 10/08/2022 1602   LDLCALC 113 (H) 10/08/2022 1602      Other studies Reviewed:  REASON FOR VISIT  Referred by Dr.Viral Schramm [place] Flakes.        TESTS  Imaging: Computed Tomographic Angiography:  Cardiac multidetector CT was performed paying particular attention to the coronary arteries for the diagnosis of: Chest Pain. Diagnostic Drugs:  Administered iohexol (Omnipaque) through an antecubital vein and images from the examination were analyzed for the presence and extent of coronary artery disease, using 3D image processing software. 100 mL of non-ionic contrast (Omnipaque) was  used.     TEST CONCLUSIONS  Quality of study: Excellent  1-Calcium score: 0  2-Left dominant system.  3-Normal Coronaries.      Adrian Blackwater MD  Electronically signed by: Adrian Blackwater     Date: 06/12/2022 14:28 REASON FOR VISIT  Visit for: Echocardiogram/R06.02  Sex:   Female   wt=  208  lbs.  BP=122/70  Height= 64   inches.        TESTS  Imaging: Echocardiogram:  An echocardiogram in (2-d) mode was performed and in Doppler mode with color flow velocity mapping was performed. The aortic valve cusps are abnormal 1.6   cm, flow velocity 1.32  m/s, and systolic calculated mean flow gradient 4  mmHg. Mitral valve diastolic peak flow velocity E .608   m/s and E/A ratio 1.2. Aortic root diameter 3.2   cm. The LVOT internal diameter 2.9  cm and flow velocity was abnormal 1.14  m/s. LV systolic dimension 2.62    cm, diastolic 3.93  cm, posterior wall thickness 1.09    cm, fractional shortening 35.3  %, and EF 62.6 %. IVS thickness 1.09  cm. LA dimension 4.6 cm. Mitral Valve has Trace Regurgitation. Pulmonic Valve has Trace Regurgitation. Tricuspid Valve has Trace Regurgitation.     ASSESSMENT  Technically adequate study.  Normal chamber sizes.  Normal left ventricular systolic function.  Mild left ventricular hypertrophy with GRADE 1 (relaxation abnormality) diastolic dysfunction.  Normal right ventricular systolic function.  Normal right ventricular diastolic function.  Normal left ventricular wall motion.  Normal right ventricular wall motion.  Trace pulmonary regurgitation.  Trace tricuspid regurgitation.  Normal pulmonary artery pressure.  Trace mitral regurgitation.  No pericardial effusion.  Mildly dilated Left atrium  No LVH.     THERAPY   Referring physician: Laurier Nancy  Sonographer: Adrian Blackwater.      Adrian Blackwater MD  Electronically signed by: Adrian Blackwater     Date: 02/22/2022 14:04 Additional studies/ records that were reviewed today include:   Review of the above records demonstrates:       No data to display            ASSESSMENT AND PLAN:    ICD-10-CM   1. Diastolic congestive heart failure, unspecified HF chronicity  I50.30     2. Chronic venous insufficiency  I87.2     3. Pre-diabetes  R73.03     4. Primary hypertension  I10     5. Mixed hyperlipidemia  E78.2    Insurance sent her message will not c over any more, may swirch to the other brands.    6. Nonrheumatic mitral valve regurgitation  I34.0    mild       Problem List Items Addressed This Visit       Cardiovascular and Mediastinum   Chronic venous insufficiency   Diastolic congestive heart failure - Primary     Other   Pre-diabetes   Other Visit Diagnoses     Primary hypertension       Mixed  hyperlipidemia       Insurance sent her message will not c over any more, may swirch to the other brands.   Nonrheumatic mitral valve regurgitation       mild          Disposition:   No follow-ups on file.    Total time spent: 30 minutes  Signed,  Adrian Blackwater, MD  11/02/2022 10:54 AM    [facility]

## 2022-11-02 NOTE — Telephone Encounter (Signed)
Spoke with patient and advised that she would need an appt, per pt she will call back to schedule depending on her daughter schedule

## 2022-11-02 NOTE — Telephone Encounter (Signed)
Patient would like to have PA for repatha redone if denied to try praulent

## 2022-11-06 ENCOUNTER — Other Ambulatory Visit: Payer: Self-pay | Admitting: Cardiovascular Disease

## 2022-11-06 DIAGNOSIS — E079 Disorder of thyroid, unspecified: Secondary | ICD-10-CM | POA: Diagnosis not present

## 2022-11-06 DIAGNOSIS — R0602 Shortness of breath: Secondary | ICD-10-CM

## 2022-11-06 DIAGNOSIS — N390 Urinary tract infection, site not specified: Secondary | ICD-10-CM | POA: Diagnosis not present

## 2022-11-06 DIAGNOSIS — Z87891 Personal history of nicotine dependence: Secondary | ICD-10-CM | POA: Diagnosis not present

## 2022-11-06 DIAGNOSIS — Z880 Allergy status to penicillin: Secondary | ICD-10-CM | POA: Diagnosis not present

## 2022-11-06 DIAGNOSIS — Z885 Allergy status to narcotic agent status: Secondary | ICD-10-CM | POA: Diagnosis not present

## 2022-11-06 DIAGNOSIS — M542 Cervicalgia: Secondary | ICD-10-CM | POA: Diagnosis not present

## 2022-11-06 DIAGNOSIS — R197 Diarrhea, unspecified: Secondary | ICD-10-CM | POA: Diagnosis not present

## 2022-11-06 DIAGNOSIS — R112 Nausea with vomiting, unspecified: Secondary | ICD-10-CM | POA: Diagnosis not present

## 2022-11-08 ENCOUNTER — Encounter: Payer: Self-pay | Admitting: Oncology

## 2022-11-08 ENCOUNTER — Ambulatory Visit (INDEPENDENT_AMBULATORY_CARE_PROVIDER_SITE_OTHER): Payer: 59 | Admitting: Nurse Practitioner

## 2022-11-08 ENCOUNTER — Encounter: Payer: Self-pay | Admitting: Nurse Practitioner

## 2022-11-08 VITALS — BP 136/80 | HR 97 | Ht 64.0 in | Wt 239.2 lb

## 2022-11-08 DIAGNOSIS — D509 Iron deficiency anemia, unspecified: Secondary | ICD-10-CM

## 2022-11-08 DIAGNOSIS — K219 Gastro-esophageal reflux disease without esophagitis: Secondary | ICD-10-CM

## 2022-11-08 DIAGNOSIS — I890 Lymphedema, not elsewhere classified: Secondary | ICD-10-CM | POA: Diagnosis not present

## 2022-11-08 DIAGNOSIS — R1310 Dysphagia, unspecified: Secondary | ICD-10-CM | POA: Diagnosis not present

## 2022-11-08 DIAGNOSIS — R7303 Prediabetes: Secondary | ICD-10-CM | POA: Diagnosis not present

## 2022-11-08 NOTE — Progress Notes (Signed)
Established Patient Office Visit  Subjective:  Patient ID: Alexis Christensen, female    DOB: 04-[name]-1971  Age: 52 y.o. MRN: [id number]  Chief Complaint  Patient presents with   Follow-up    4 week follow up    4 week follow up for esophagitis and GERD, increased to 40 mg BID.  Symptoms are the same.  Episodic eating solids that it feels like the food is stuck and has to bring it back up in order to swallow it again.      No other concerns at this time.   Past Medical History:  Diagnosis Date   Acute diastolic (congestive) heart failure    ADHD    Adopted    Anemia    Anxiety    Arthritis    Asthma    B12 deficiency    Bilateral swelling of feet and ankles    Bipolar 1 disorder    Bipolar disorder    Carpal tunnel syndrome 09/15/2014   Chest pain    CHF (congestive heart failure)    Dialostic CHF   Chronic diarrhea 09/07/2015   Chronic kidney disease    H/O KIDNEY STONES   Chronic venous insufficiency 04/26/2016   Depression    DVT (deep venous thrombosis) 2016   RIGHT LEG   Edema leg    Effusion of knee 12/30/2013   Family history of adverse reaction to anesthesia    ADOPTED   Food allergy    GERD (gastroesophageal reflux disease)    Goiter    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   Headache(784.0)    MIGRAINES   Heart burn    Heart murmur    History of blood clots    History of kidney stones    HLD (hyperlipidemia)    Hx MRSA infection    Hyperlipidemia    Hypothyroidism    Lipoma of arm 05/11/2013   Lower extremity edema    Lymphedema 04/26/2016   Other fatigue    Post traumatic stress disorder (PTSD)    Prediabetes    PVD (peripheral vascular disease)    Seasonal allergies    Shortness of breath    Shortness of breath on exertion    Stress incontinence    Thyroid disease    Urge incontinence    Vitamin D deficiency     Past Surgical History:  Procedure Laterality Date   ABDOMINAL HYSTERECTOMY     ANKLE SURGERY     CHOLECYSTECTOMY      COLONOSCOPY WITH PROPOFOL N/A 06/24/2019   Procedure: COLONOSCOPY WITH PROPOFOL;  Surgeon: Toledo, Boykin Nearing, MD;  Location: [facility];  Service: Gastroenterology;  Laterality: N/A;   CYSTOSCOPY N/A 04/09/2016   Procedure: CYSTOSCOPY;  Surgeon: Alfredo Martinez, MD;  Location: [facility];  Service: Urology;  Laterality: N/A;   DILATION AND CURETTAGE OF UTERUS     ESOPHAGOGASTRODUODENOSCOPY (EGD) WITH PROPOFOL N/A 06/24/2019   Procedure: ESOPHAGOGASTRODUODENOSCOPY (EGD) WITH PROPOFOL;  Surgeon: Toledo, Boykin Nearing, MD;  Location: [facility];  Service: Gastroenterology;  Laterality: N/A;   INGUINAL HERNIA REPAIR Bilateral 04/18/2017   Procedure: LAPAROSCOPIC BILATERAL INGUINAL HERNIA REPAIR;  Surgeon: Leafy Ro, MD;  Location: [facility];  Service: General;  Laterality: Bilateral;   INTERSTIM IMPLANT PLACEMENT N/A 11/26/2016   Procedure: Leane Platt IMPLANT FIRST STAGE;  Surgeon: Alfredo Martinez, MD;  Location: [facility];  Service: Urology;  Laterality: N/A;   INTERSTIM IMPLANT PLACEMENT N/A 11/26/2016   Procedure: Leane Platt IMPLANT SECOND STAGE;  Surgeon: Alfredo Martinez, MD;  Location: [facility];  Service: Urology;  Laterality: N/A;   JOINT REPLACEMENT Right    knee   KNEE ARTHROSCOPY Bilateral    KNEE ARTHROSCOPY WITH LATERAL RELEASE Left 10/18/2015   Procedure: KNEE ARTHROSCOPY LATERAL AND PARTIAL SYNOVECTOMY;  Surgeon: Kennedy Bucker, MD;  Location: [facility];  Service: Orthopedics;  Laterality: Left;   Lymph Node removal  2015   Neck   PUBOVAGINAL SLING N/A 04/09/2016   Procedure: PUBO-VAGINAL SLING/ RETROPUBIC SLING;  Surgeon: Alfredo Martinez, MD;  Location: [facility];  Service: Urology;  Laterality: N/A;   REPLACEMENT TOTAL KNEE Right    SHOULDER SURGERY Right 2014   TUBAL LIGATION     WRIST SURGERY Right    metal plate    Social History   Socioeconomic History   Marital status: Divorced    Spouse name: Not on file   Number of children: Not on file   Years of education: Not on  file   Highest education level: Not on file  Occupational History   Occupation: Customer Service    Employer: [employer]  Tobacco Use   Smoking status: Former    Packs/day: 0.50    Years: 10.00    Additional pack years: 0.00    Total pack years: 5.00    Types: Cigarettes    Start date: 07/16/2008    Quit date: 07/16/2019    Years since quitting: 3.3    Passive exposure: Past   Smokeless tobacco: Former   Tobacco comments:    STRESS RELATED  Vaping Use   Vaping Use: Never used  Substance and Sexual Activity   Alcohol use: Not Currently    Comment: Hisotry of ETOH abouse ASB patient   Drug use: Yes    Types: Other-see comments    Comment: Pt listed history of abuse of "prescription drugs"   Sexual activity: Not Currently  Other Topics Concern   Not on file  Social History Narrative   Not on file   Social Determinants of Health   Financial Resource Strain: Not on file  Food Insecurity: Not on file  Transportation Needs: Not on file  Physical Activity: Not on file  Stress: Not on file  Social Connections: Not on file  Intimate Partner Violence: Not on file    Family History  Adopted: Yes  Family history unknown: Yes    Allergies  Allergen Reactions   Geodon [Ziprasidone Hydrochloride] Other (See Comments)    Numbness ,sob, headaches, blurred vision   Sulfacetamide Sodium Hives   Ziprasidone Anaphylaxis    Other reaction(s): Other (See Comments) Numbness ,sob, headaches, blurred vision  Other reaction(s): Other (See Comments), Unknown Numbness ,sob, headaches, blurred vision Numbness ,sob, headaches, blurred vision  Other reaction(s): Other (See Comments), Unknown Numbness ,sob, headaches, blurred vision Numbness ,sob, headaches, blurred vision   Numbness ,sob, headaches, blurred vision Other reaction(s): Other (See Comments), Unknown Numbness ,sob, headaches, blurred vision Numbness ,sob, headaches, blurred vision Other reaction(s): Other (See  Comments), Unknown Numbness ,sob, headaches, blurred vision Numbness ,sob, headaches, blurred vision   Ziprasidone Hcl Anaphylaxis and Other (See Comments)    Other reaction(s): Other (See Comments), Unknown Numbness ,sob, headaches, blurred vision Numbness ,sob, headaches, blurred vision   Ace Inhibitors Rash   Erythromycin Rash and Hives   Hydromorphone Rash   Lamictal [Lamotrigine] Rash    Other reaction(s): Unknown   Strawberry (Diagnostic) Rash   Sulfa Antibiotics Hives and Rash    Other reaction(s): Unknown Other reaction(s): Unknown Other reaction(s): Unknown  Review of Systems  Constitutional: Negative.   HENT: Negative.    Eyes: Negative.   Respiratory: Negative.    Cardiovascular: Negative.   Gastrointestinal:  Positive for heartburn and nausea.  Genitourinary: Negative.   Musculoskeletal:  Positive for back pain.  Skin: Negative.   Neurological: Negative.   Endo/Heme/Allergies: Negative.   Psychiatric/Behavioral:  The patient is nervous/anxious.        Objective:   BP 136/80   Pulse 97   Ht  (1.626 m)   Wt 239 lb 3.2 oz (108.5 kg)   SpO2 97%   BMI 41.06 kg/m   Vitals:   11/08/22 1511  BP: 136/80  Pulse: 97  Height:  (1.626 m)  Weight: 239 lb 3.2 oz (108.5 kg)  SpO2: 97%  BMI (Calculated): 41.04    Physical Exam Vitals reviewed.  Constitutional:      Appearance: Normal appearance.  HENT:     Head: Normocephalic.     Nose: Nose normal.     Mouth/Throat:     Mouth: Mucous membranes are moist.  Eyes:     Pupils: Pupils are equal, round, and reactive to light.  Cardiovascular:     Rate and Rhythm: Normal rate and regular rhythm.  Pulmonary:     Effort: Pulmonary effort is normal.     Breath sounds: Normal breath sounds.  Abdominal:     General: Bowel sounds are normal.     Palpations: Abdomen is soft.  Musculoskeletal:        General: Normal range of motion.     Cervical back: Normal range of motion and neck supple.   Skin:    General: Skin is warm and dry.  Neurological:     Mental Status: She is alert and oriented to person, place, and time.  Psychiatric:        Mood and Affect: Mood normal.        Behavior: Behavior normal.      No results found for any visits on 11/08/22.  Recent Results (from the past 2160 hour(s))  Urinalysis, Complete     Status: Abnormal   Collection Time: 09/17/22  8:23 AM  Result Value Ref Range   Specific Gravity, UA 1.025 1.005 - 1.030   pH, UA 5.5 5.0 - 7.5   Color, UA Yellow Yellow   Appearance Ur Hazy (A) Clear   Leukocytes,UA 2+ (A) Negative   Protein,UA Negative Negative/Trace   Glucose, UA Negative Negative   Ketones, UA Negative Negative   RBC, UA Trace (A) Negative   Bilirubin, UA Negative Negative   Urobilinogen, Ur 0.2 0.2 - 1.0 mg/dL   Nitrite, UA Negative Negative   Microscopic Examination See below:   Microscopic Examination     Status: Abnormal   Collection Time: 09/17/22  8:23 AM   Urine  Result Value Ref Range   WBC, UA >30 (A) 0 - 5 /hpf   RBC, Urine 3-10 (A) 0 - 2 /hpf   Epithelial Cells (non renal) 0-10 0 - 10 /hpf   Renal Epithel, UA 0-10 (A) None seen /hpf   Casts Present (A) None seen /lpf   Cast Type Hyaline casts N/A   Mucus, UA Present (A) Not Estab.   Bacteria, UA Many (A) None seen/Few  CULTURE, URINE COMPREHENSIVE     Status: None   Collection Time: 09/17/22  8:40 AM   Specimen: Urine   UR  Result Value Ref Range   Urine Culture, Comprehensive Final report  Organism ID, Bacteria Comment     Comment: Mixed urogenital flora 10,000-25,000 colony forming units per mL   Urinalysis, Complete     Status: Abnormal   Collection Time: 10/01/22  9:20 AM  Result Value Ref Range   Specific Gravity, UA >1.030 (H) 1.005 - 1.030   pH, UA 5.5 5.0 - 7.5   Color, UA Yellow Yellow   Appearance Ur Cloudy (A) Clear   Leukocytes,UA 1+ (A) Negative   Protein,UA 1+ (A) Negative/Trace   Glucose, UA Negative Negative   Ketones, UA  Negative Negative   RBC, UA Trace (A) Negative   Bilirubin, UA Negative Negative   Urobilinogen, Ur 0.2 0.2 - 1.0 mg/dL   Nitrite, UA Negative Negative   Microscopic Examination See below:   Microscopic Examination     Status: Abnormal   Collection Time: 10/01/22  9:20 AM   Urine  Result Value Ref Range   WBC, UA >30 (A) 0 - 5 /hpf   RBC, Urine 3-10 (A) 0 - 2 /hpf   Epithelial Cells (non renal) >10 (A) 0 - 10 /hpf   Mucus, UA Present (A) Not Estab.   Bacteria, UA Many (A) None seen/Few  CBC With Differential     Status: None   Collection Time: 10/08/22  4:02 PM  Result Value Ref Range   WBC 8.9 3.4 - 10.8 x10E3/uL   RBC 3.95 3.77 - 5.28 x10E6/uL   Hemoglobin 11.4 11.1 - 15.9 g/dL   Hematocrit 16.1 09.6 - 46.6 %   MCV 87 79 - 97 fL   MCH 28.9 26.6 - 33.0 pg   MCHC 33.1 31.5 - 35.7 g/dL   RDW 04.5 40.9 - 81.1 %   Neutrophils 59 Not Estab. %   Lymphs 29 Not Estab. %   Monocytes 8 Not Estab. %   Eos 3 Not Estab. %   Basos 1 Not Estab. %   Neutrophils Absolute 5.3 1.4 - 7.0 x10E3/uL   Lymphocytes Absolute 2.6 0.7 - 3.1 x10E3/uL   Monocytes Absolute 0.7 0.1 - 0.9 x10E3/uL   EOS (ABSOLUTE) 0.2 0.0 - 0.4 x10E3/uL   Basophils Absolute 0.1 0.0 - 0.2 x10E3/uL   Immature Granulocytes 0 Not Estab. %   Immature Grans (Abs) 0.0 0.0 - 0.1 x10E3/uL  Hemoglobin A1c     Status: Abnormal   Collection Time: 10/08/22  4:02 PM  Result Value Ref Range   Hgb A1c MFr Bld 6.0 (H) 4.8 - 5.6 %    Comment:          Prediabetes: 5.7 - 6.4          Diabetes: >6.4          Glycemic control for adults with diabetes: <7.0    Est. average glucose Bld gHb Est-mCnc 126 mg/dL  TSH     Status: Abnormal   Collection Time: 10/08/22  4:02 PM  Result Value Ref Range   TSH 15.200 (H) 0.450 - 4.500 uIU/mL  CMP14+EGFR     Status: None   Collection Time: 10/08/22  4:02 PM  Result Value Ref Range   Glucose 94 70 - 99 mg/dL   BUN 12 6 - 24 mg/dL   Creatinine, Ser 9.14 0.57 - 1.00 mg/dL   eGFR 782 >95  AO/ZHY/8.65   BUN/Creatinine Ratio 18 9 - 23   Sodium 141 134 - 144 mmol/L   Potassium 4.2 3.5 - 5.2 mmol/L   Chloride 101 96 - 106 mmol/L   CO2 27 20 -  29 mmol/L   Calcium 9.1 8.7 - 10.2 mg/dL   Total Protein 6.3 6.0 - 8.5 g/dL   Albumin 3.9 3.8 - 4.9 g/dL   Globulin, Total 2.4 1.5 - 4.5 g/dL   Albumin/Globulin Ratio 1.6 1.2 - 2.2   Bilirubin Total 0.3 0.0 - 1.2 mg/dL   Alkaline Phosphatase 118 44 - 121 IU/L   AST 20 0 - 40 IU/L   ALT 23 0 - 32 IU/L  Lipid panel     Status: Abnormal   Collection Time: 10/08/22  4:02 PM  Result Value Ref Range   Cholesterol, Total 196 100 - 199 mg/dL   Triglycerides 96 0 - 149 mg/dL   HDL 66 >40 mg/dL   VLDL Cholesterol Cal 17 5 - 40 mg/dL   LDL Chol Calc (NIH) 981 (H) 0 - 99 mg/dL   Chol/HDL Ratio 3.0 0.0 - 4.4 ratio    Comment:                                   T. Chol/HDL Ratio                                             Men  Women                               1/2 Avg.Risk  3.4    3.3                                   Avg.Risk  5.0    4.4                                2X Avg.Risk  9.6    7.1                                3X Avg.Risk 23.4   11.0       Assessment & Plan:   Problem List Items Addressed This Visit   None   No follow-ups on file.   Total time spent: 35 minutes  Orson Eva, NP  11/08/2022

## 2022-11-08 NOTE — Patient Instructions (Addendum)
1) recent treatment from ER for UTI 2) GI referral for possible esophageal dilatation 3) Pt will be going to [facility] but may need to drop off UA 4) Follow up appt in 3 months, fasting labs prior

## 2022-11-12 LAB — VAS US ABI WITH/WO TBI
Left ABI: 1.27
Right ABI: 1.15

## 2022-11-13 DIAGNOSIS — Z79899 Other long term (current) drug therapy: Secondary | ICD-10-CM | POA: Diagnosis not present

## 2022-11-15 ENCOUNTER — Encounter: Payer: Self-pay | Admitting: Oncology

## 2022-11-15 DIAGNOSIS — E042 Nontoxic multinodular goiter: Secondary | ICD-10-CM | POA: Diagnosis not present

## 2022-11-15 DIAGNOSIS — E063 Autoimmune thyroiditis: Secondary | ICD-10-CM | POA: Diagnosis not present

## 2022-11-19 ENCOUNTER — Ambulatory Visit (INDEPENDENT_AMBULATORY_CARE_PROVIDER_SITE_OTHER): Payer: 59

## 2022-11-19 ENCOUNTER — Ambulatory Visit (INDEPENDENT_AMBULATORY_CARE_PROVIDER_SITE_OTHER): Payer: 59 | Admitting: Nurse Practitioner

## 2022-11-19 ENCOUNTER — Encounter: Payer: Self-pay | Admitting: Nurse Practitioner

## 2022-11-19 VITALS — BP 124/88 | HR 90 | Ht 64.0 in | Wt 239.4 lb

## 2022-11-19 DIAGNOSIS — K219 Gastro-esophageal reflux disease without esophagitis: Secondary | ICD-10-CM

## 2022-11-19 DIAGNOSIS — R7303 Prediabetes: Secondary | ICD-10-CM | POA: Diagnosis not present

## 2022-11-19 DIAGNOSIS — E038 Other specified hypothyroidism: Secondary | ICD-10-CM | POA: Diagnosis not present

## 2022-11-19 DIAGNOSIS — M25512 Pain in left shoulder: Secondary | ICD-10-CM | POA: Diagnosis not present

## 2022-11-19 MED ORDER — PREDNISONE 50 MG PO TABS
ORAL_TABLET | ORAL | 0 refills | Status: DC
Start: 2022-11-19 — End: 2022-12-17

## 2022-11-19 MED ORDER — METHOCARBAMOL 500 MG PO TABS
500.0000 mg | ORAL_TABLET | Freq: Three times a day (TID) | ORAL | 0 refills | Status: DC
Start: 2022-11-19 — End: 2024-04-30

## 2022-11-19 NOTE — Patient Instructions (Addendum)
1) SalonPas patch sample given to patient 2) Pred/robaxin tx 3) Heat 4) Left shoulder xray 5) Follow up appt in 1 week

## 2022-11-19 NOTE — Progress Notes (Signed)
Established Patient Office Visit  Subjective:  Patient ID: Alexis Christensen, female    DOB: May 22, 1971  Age: 52 y.o. MRN: [id number]  Chief Complaint  Patient presents with   Acute Visit    Left shoulder pain    Acute visit, cervicalgia (went to ER last week for this) and also having left anterior and posterior pain.  Started Friday.  Denies lifting anything too heavy. Anterior left shoulder pain with most abduction/adduction and extension maneuvers.    No other concerns at this time.   Past Medical History:  Diagnosis Date   Acute diastolic (congestive) heart failure (HCC)    ADHD    Adopted    Anemia    Anxiety    Arthritis    Asthma    B12 deficiency    Bilateral swelling of feet and ankles    Bipolar 1 disorder (HCC)    Bipolar disorder (HCC)    Carpal tunnel syndrome 09/15/2014   Chest pain    CHF (congestive heart failure) (HCC)    Dialostic CHF   Chronic diarrhea 09/07/2015   Chronic kidney disease    H/O KIDNEY STONES   Chronic venous insufficiency 04/26/2016   Depression    DVT (deep venous thrombosis) (HCC) 2016   RIGHT LEG   Edema leg    Effusion of knee 12/30/2013   Family history of adverse reaction to anesthesia    ADOPTED   Food allergy    GERD (gastroesophageal reflux disease)    Goiter    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   Headache(784.0)    MIGRAINES   Heart burn    Heart murmur    History of blood clots    History of kidney stones    HLD (hyperlipidemia)    Hx MRSA infection    Hyperlipidemia    Hypothyroidism    Lipoma of arm 05/11/2013   Lower extremity edema    Lymphedema 04/26/2016   Other fatigue    Post traumatic stress disorder (PTSD)    Prediabetes    PVD (peripheral vascular disease) (HCC)    Seasonal allergies    Shortness of breath    Shortness of breath on exertion    Stress incontinence    Thyroid disease    Urge incontinence    Vitamin D deficiency     Past Surgical History:  Procedure Laterality Date    ABDOMINAL HYSTERECTOMY     ANKLE SURGERY     CHOLECYSTECTOMY     COLONOSCOPY WITH PROPOFOL N/A 06/24/2019   Procedure: COLONOSCOPY WITH PROPOFOL;  Surgeon: Toledo, Boykin Nearing, MD;  Location: [facility];  Service: Gastroenterology;  Laterality: N/A;   CYSTOSCOPY N/A 04/09/2016   Procedure: CYSTOSCOPY;  Surgeon: Alfredo Martinez, MD;  Location: [facility];  Service: Urology;  Laterality: N/A;   DILATION AND CURETTAGE OF UTERUS     ESOPHAGOGASTRODUODENOSCOPY (EGD) WITH PROPOFOL N/A 06/24/2019   Procedure: ESOPHAGOGASTRODUODENOSCOPY (EGD) WITH PROPOFOL;  Surgeon: Toledo, Boykin Nearing, MD;  Location: [facility];  Service: Gastroenterology;  Laterality: N/A;   INGUINAL HERNIA REPAIR Bilateral 04/18/2017   Procedure: LAPAROSCOPIC BILATERAL INGUINAL HERNIA REPAIR;  Surgeon: Leafy Ro, MD;  Location: [facility];  Service: General;  Laterality: Bilateral;   INTERSTIM IMPLANT PLACEMENT N/A 11/26/2016   Procedure: Leane Platt IMPLANT FIRST STAGE;  Surgeon: Alfredo Martinez, MD;  Location: [facility];  Service: Urology;  Laterality: N/A;   INTERSTIM IMPLANT PLACEMENT N/A 11/26/2016   Procedure: Leane Platt IMPLANT SECOND STAGE;  Surgeon: Alfredo Martinez, MD;  Location:  [facility];  Service: Urology;  Laterality: N/A;   JOINT REPLACEMENT Right    knee   KNEE ARTHROSCOPY Bilateral    KNEE ARTHROSCOPY WITH LATERAL RELEASE Left 10/18/2015   Procedure: KNEE ARTHROSCOPY LATERAL AND PARTIAL SYNOVECTOMY;  Surgeon: Kennedy Bucker, MD;  Location: [facility];  Service: Orthopedics;  Laterality: Left;   Lymph Node removal  2015   Neck   PUBOVAGINAL SLING N/A 04/09/2016   Procedure: PUBO-VAGINAL SLING/ RETROPUBIC SLING;  Surgeon: Alfredo Martinez, MD;  Location: [facility];  Service: Urology;  Laterality: N/A;   REPLACEMENT TOTAL KNEE Right    SHOULDER SURGERY Right 2014   TUBAL LIGATION     WRIST SURGERY Right    metal plate    Social History   Socioeconomic History   Marital status: Divorced    Spouse name: Not on file    Number of children: Not on file   Years of education: Not on file   Highest education level: Not on file  Occupational History   Occupation: Customer Service    Employer: [employer]  Tobacco Use   Smoking status: Former    Packs/day: 0.50    Years: 10.00    Additional pack years: 0.00    Total pack years: 5.00    Types: Cigarettes    Start date: 07/16/2008    Quit date: 07/16/2019    Years since quitting: 3.3    Passive exposure: Past   Smokeless tobacco: Former   Tobacco comments:    STRESS RELATED  Vaping Use   Vaping Use: Never used  Substance and Sexual Activity   Alcohol use: Not Currently    Comment: Hisotry of ETOH abouse ASB patient   Drug use: Yes    Types: Other-see comments    Comment: Pt listed history of abuse of "prescription drugs"   Sexual activity: Not Currently  Other Topics Concern   Not on file  Social History Narrative   Not on file   Social Determinants of Health   Financial Resource Strain: Not on file  Food Insecurity: Not on file  Transportation Needs: Not on file  Physical Activity: Not on file  Stress: Not on file  Social Connections: Not on file  Intimate Partner Violence: Not on file    Family History  Adopted: Yes  Family history unknown: Yes    Allergies  Allergen Reactions   Geodon [Ziprasidone Hydrochloride] Other (See Comments)    Numbness ,sob, headaches, blurred vision   Sulfacetamide Sodium Hives   Ziprasidone Anaphylaxis    Other reaction(s): Other (See Comments) Numbness ,sob, headaches, blurred vision  Other reaction(s): Other (See Comments), Unknown Numbness ,sob, headaches, blurred vision Numbness ,sob, headaches, blurred vision  Other reaction(s): Other (See Comments), Unknown Numbness ,sob, headaches, blurred vision Numbness ,sob, headaches, blurred vision   Numbness ,sob, headaches, blurred vision Other reaction(s): Other (See Comments), Unknown Numbness ,sob, headaches, blurred vision Numbness ,sob,  headaches, blurred vision Other reaction(s): Other (See Comments), Unknown Numbness ,sob, headaches, blurred vision Numbness ,sob, headaches, blurred vision   Ziprasidone Hcl Anaphylaxis and Other (See Comments)    Other reaction(s): Other (See Comments), Unknown Numbness ,sob, headaches, blurred vision Numbness ,sob, headaches, blurred vision   Ace Inhibitors Rash   Erythromycin Rash and Hives   Hydromorphone Rash   Lamictal [Lamotrigine] Rash    Other reaction(s): Unknown   Strawberry (Diagnostic) Rash   Sulfa Antibiotics Hives and Rash    Other reaction(s): Unknown Other reaction(s): Unknown Other reaction(s): Unknown    Review  of Systems  Constitutional: Negative.   HENT: Negative.    Eyes: Negative.   Respiratory: Negative.    Cardiovascular: Negative.   Gastrointestinal:  Positive for heartburn.  Genitourinary: Negative.   Musculoskeletal:  Positive for joint pain, myalgias and neck pain.  Skin: Negative.   Neurological: Negative.   Endo/Heme/Allergies: Negative.   Psychiatric/Behavioral:  Positive for depression. The patient is nervous/anxious.        Objective:   BP 124/88   Pulse 90   Ht 5\' 4"  (1.626 m)   Wt 239 lb 6.4 oz (108.6 kg)   SpO2 96%   BMI 41.09 kg/m   Vitals:   11/19/22 1253  BP: 124/88  Pulse: 90  Height: 5\' 4"  (1.626 m)  Weight: 239 lb 6.4 oz (108.6 kg)  SpO2: 96%  BMI (Calculated): 41.07    Physical Exam Vitals reviewed.  Constitutional:      Appearance: Normal appearance.  HENT:     Head: Normocephalic.     Nose: Nose normal.     Mouth/Throat:     Mouth: Mucous membranes are moist.  Eyes:     Pupils: Pupils are equal, round, and reactive to light.  Cardiovascular:     Rate and Rhythm: Normal rate and regular rhythm.  Pulmonary:     Effort: Pulmonary effort is normal.     Breath sounds: Normal breath sounds.  Abdominal:     General: Bowel sounds are normal.     Palpations: Abdomen is soft.  Musculoskeletal:         General: Swelling and tenderness present.     Cervical back: Normal range of motion and neck supple.  Skin:    General: Skin is warm and dry.  Neurological:     Mental Status: She is alert and oriented to person, place, and time.  Psychiatric:        Mood and Affect: Mood normal.        Behavior: Behavior normal.      No results found for any visits on 11/19/22.  Recent Results (from the past 2160 hour(s))  Urinalysis, Complete     Status: Abnormal   Collection Time: 09/17/22  8:23 AM  Result Value Ref Range   Specific Gravity, UA 1.025 1.005 - 1.030   pH, UA 5.5 5.0 - 7.5   Color, UA Yellow Yellow   Appearance Ur Hazy (A) Clear   Leukocytes,UA 2+ (A) Negative   Protein,UA Negative Negative/Trace   Glucose, UA Negative Negative   Ketones, UA Negative Negative   RBC, UA Trace (A) Negative   Bilirubin, UA Negative Negative   Urobilinogen, Ur 0.2 0.2 - 1.0 mg/dL   Nitrite, UA Negative Negative   Microscopic Examination See below:   Microscopic Examination     Status: Abnormal   Collection Time: 09/17/22  8:23 AM   Urine  Result Value Ref Range   WBC, UA >30 (A) 0 - 5 /hpf   RBC, Urine 3-10 (A) 0 - 2 /hpf   Epithelial Cells (non renal) 0-10 0 - 10 /hpf   Renal Epithel, UA 0-10 (A) None seen /hpf   Casts Present (A) None seen /lpf   Cast Type Hyaline casts N/A   Mucus, UA Present (A) Not Estab.   Bacteria, UA Many (A) None seen/Few  CULTURE, URINE COMPREHENSIVE     Status: None   Collection Time: 09/17/22  8:40 AM   Specimen: Urine   UR  Result Value Ref Range   Urine Culture, Comprehensive  Final report    Organism ID, Bacteria Comment     Comment: Mixed urogenital flora 10,000-25,000 colony forming units per mL   Urinalysis, Complete     Status: Abnormal   Collection Time: 10/01/22  9:20 AM  Result Value Ref Range   Specific Gravity, UA >1.030 (H) 1.005 - 1.030   pH, UA 5.5 5.0 - 7.5   Color, UA Yellow Yellow   Appearance Ur Cloudy (A) Clear   Leukocytes,UA 1+  (A) Negative   Protein,UA 1+ (A) Negative/Trace   Glucose, UA Negative Negative   Ketones, UA Negative Negative   RBC, UA Trace (A) Negative   Bilirubin, UA Negative Negative   Urobilinogen, Ur 0.2 0.2 - 1.0 mg/dL   Nitrite, UA Negative Negative   Microscopic Examination See below:   Microscopic Examination     Status: Abnormal   Collection Time: 10/01/22  9:20 AM   Urine  Result Value Ref Range   WBC, UA >30 (A) 0 - 5 /hpf   RBC, Urine 3-10 (A) 0 - 2 /hpf   Epithelial Cells (non renal) >10 (A) 0 - 10 /hpf   Mucus, UA Present (A) Not Estab.   Bacteria, UA Many (A) None seen/Few  CBC With Differential     Status: None   Collection Time: 10/08/22  4:02 PM  Result Value Ref Range   WBC 8.9 3.4 - 10.8 x10E3/uL   RBC 3.95 3.77 - 5.28 x10E6/uL   Hemoglobin 11.4 11.1 - 15.9 g/dL   Hematocrit 16.1 09.6 - 46.6 %   MCV 87 79 - 97 fL   MCH 28.9 26.6 - 33.0 pg   MCHC 33.1 31.5 - 35.7 g/dL   RDW 04.5 40.9 - 81.1 %   Neutrophils 59 Not Estab. %   Lymphs 29 Not Estab. %   Monocytes 8 Not Estab. %   Eos 3 Not Estab. %   Basos 1 Not Estab. %   Neutrophils Absolute 5.3 1.4 - 7.0 x10E3/uL   Lymphocytes Absolute 2.6 0.7 - 3.1 x10E3/uL   Monocytes Absolute 0.7 0.1 - 0.9 x10E3/uL   EOS (ABSOLUTE) 0.2 0.0 - 0.4 x10E3/uL   Basophils Absolute 0.1 0.0 - 0.2 x10E3/uL   Immature Granulocytes 0 Not Estab. %   Immature Grans (Abs) 0.0 0.0 - 0.1 x10E3/uL  Hemoglobin A1c     Status: Abnormal   Collection Time: 10/08/22  4:02 PM  Result Value Ref Range   Hgb A1c MFr Bld 6.0 (H) 4.8 - 5.6 %    Comment:          Prediabetes: 5.7 - 6.4          Diabetes: >6.4          Glycemic control for adults with diabetes: <7.0    Est. average glucose Bld gHb Est-mCnc 126 mg/dL  TSH     Status: Abnormal   Collection Time: 10/08/22  4:02 PM  Result Value Ref Range   TSH 15.200 (H) 0.450 - 4.500 uIU/mL  CMP14+EGFR     Status: None   Collection Time: 10/08/22  4:02 PM  Result Value Ref Range   Glucose 94 70 -  99 mg/dL   BUN 12 6 - 24 mg/dL   Creatinine, Ser 9.14 0.57 - 1.00 mg/dL   eGFR 782 >95 AO/ZHY/8.65   BUN/Creatinine Ratio 18 9 - 23   Sodium 141 134 - 144 mmol/L   Potassium 4.2 3.5 - 5.2 mmol/L   Chloride 101 96 - 106 mmol/L  CO2 27 20 - 29 mmol/L   Calcium 9.1 8.7 - 10.2 mg/dL   Total Protein 6.3 6.0 - 8.5 g/dL   Albumin 3.9 3.8 - 4.9 g/dL   Globulin, Total 2.4 1.5 - 4.5 g/dL   Albumin/Globulin Ratio 1.6 1.2 - 2.2   Bilirubin Total 0.3 0.0 - 1.2 mg/dL   Alkaline Phosphatase 118 44 - 121 IU/L   AST 20 0 - 40 IU/L   ALT 23 0 - 32 IU/L  Lipid panel     Status: Abnormal   Collection Time: 10/08/22  4:02 PM  Result Value Ref Range   Cholesterol, Total 196 100 - 199 mg/dL   Triglycerides 96 0 - 149 mg/dL   HDL 66 >16 mg/dL   VLDL Cholesterol Cal 17 5 - 40 mg/dL   LDL Chol Calc (NIH) 109 (H) 0 - 99 mg/dL   Chol/HDL Ratio 3.0 0.0 - 4.4 ratio    Comment:                                   T. Chol/HDL Ratio                                             Men  Women                               1/2 Avg.Risk  3.4    3.3                                   Avg.Risk  5.0    4.4                                2X Avg.Risk  9.6    7.1                                3X Avg.Risk 23.4   11.0   VAS [name] ABI WITH/WO TBI     Status: None   Collection Time: 11/01/22  7:52 AM  Result Value Ref Range   Right ABI 1.15    Left ABI 1.27       Assessment & Plan:   Problem List Items Addressed This Visit   None   No follow-ups on file.   Total time spent: 35 minutes  Orson Eva, NP  11/19/2022

## 2022-11-20 ENCOUNTER — Encounter: Payer: Self-pay | Admitting: Nurse Practitioner

## 2022-11-22 DIAGNOSIS — E063 Autoimmune thyroiditis: Secondary | ICD-10-CM | POA: Diagnosis not present

## 2022-11-22 DIAGNOSIS — E042 Nontoxic multinodular goiter: Secondary | ICD-10-CM | POA: Diagnosis not present

## 2022-11-23 NOTE — Telephone Encounter (Signed)
Submitted PA again just awaiting the response

## 2022-11-26 ENCOUNTER — Other Ambulatory Visit: Payer: Self-pay

## 2022-11-26 ENCOUNTER — Ambulatory Visit (INDEPENDENT_AMBULATORY_CARE_PROVIDER_SITE_OTHER): Payer: 59 | Admitting: Nurse Practitioner

## 2022-11-26 ENCOUNTER — Encounter: Payer: Self-pay | Admitting: Nurse Practitioner

## 2022-11-26 VITALS — BP 120/82 | HR 96 | Ht 64.0 in | Wt 240.0 lb

## 2022-11-26 DIAGNOSIS — N3000 Acute cystitis without hematuria: Secondary | ICD-10-CM | POA: Diagnosis not present

## 2022-11-26 DIAGNOSIS — M25512 Pain in left shoulder: Secondary | ICD-10-CM

## 2022-11-26 DIAGNOSIS — N390 Urinary tract infection, site not specified: Secondary | ICD-10-CM | POA: Insufficient documentation

## 2022-11-26 HISTORY — DX: Urinary tract infection, site not specified: N39.0

## 2022-11-26 LAB — POCT URINALYSIS DIPSTICK
Bilirubin, UA: NEGATIVE
Blood, UA: NEGATIVE
Glucose, UA: NEGATIVE
Ketones, UA: NEGATIVE
Nitrite, UA: NEGATIVE
Protein, UA: NEGATIVE
Spec Grav, UA: >=1.03 — AB (ref 1.010–1.025)
Urobilinogen, UA: 0.2 E.U./dL
pH, UA: 6 (ref 5.0–8.0)

## 2022-11-26 MED ORDER — NITROFURANTOIN MONOHYD MACRO 100 MG PO CAPS: 100.0000 mg | ORAL_CAPSULE | Freq: Two times a day (BID) | ORAL | 0 refills | Status: DC

## 2022-11-26 NOTE — Patient Instructions (Signed)
1) UA positive for uti 2) Return on nurses' sched in 7-10 days for repeat UA 3) Ortho referral sent for left shoulder pain with decreased AROM

## 2022-11-26 NOTE — Progress Notes (Signed)
Established Patient Office Visit  Subjective:  Patient ID: Alexis Christensen, female    DOB: December 29, 1970  Age: 52 y.o. MRN: [id number]  Chief Complaint  Patient presents with   Follow-up    1 week follow up    1 week follow up.  Still sore and tender, cannot lift higher than shoulder itself.     No other concerns at this time.   Past Medical History:  Diagnosis Date   Acute diastolic (congestive) heart failure (HCC)    ADHD    Adopted    Anemia    Anxiety    Arthritis    Asthma    B12 deficiency    Bilateral swelling of feet and ankles    Bipolar 1 disorder (HCC)    Bipolar disorder (HCC)    Carpal tunnel syndrome 09/15/2014   Chest pain    CHF (congestive heart failure) (HCC)    Dialostic CHF   Chronic diarrhea 09/07/2015   Chronic kidney disease    H/O KIDNEY STONES   Chronic venous insufficiency 04/26/2016   Depression    DVT (deep venous thrombosis) (HCC) 2016   RIGHT LEG   Edema leg    Effusion of knee 12/30/2013   Family history of adverse reaction to anesthesia    ADOPTED   Food allergy    GERD (gastroesophageal reflux disease)    Goiter    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   Headache(784.0)    MIGRAINES   Heart burn    Heart murmur    History of blood clots    History of kidney stones    HLD (hyperlipidemia)    Hx MRSA infection    Hyperlipidemia    Hypothyroidism    Lipoma of arm 05/11/2013   Lower extremity edema    Lymphedema 04/26/2016   Other fatigue    Post traumatic stress disorder (PTSD)    Prediabetes    PVD (peripheral vascular disease) (HCC)    Seasonal allergies    Shortness of breath    Shortness of breath on exertion    Stress incontinence    Thyroid disease    Urge incontinence    Vitamin D deficiency     Past Surgical History:  Procedure Laterality Date   ABDOMINAL HYSTERECTOMY     ANKLE SURGERY     CHOLECYSTECTOMY     COLONOSCOPY WITH PROPOFOL N/A 06/24/2019   Procedure: COLONOSCOPY WITH PROPOFOL;  Surgeon:  Toledo, Boykin Nearing, MD;  Location: [facility];  Service: Gastroenterology;  Laterality: N/A;   CYSTOSCOPY N/A 04/09/2016   Procedure: CYSTOSCOPY;  Surgeon: Alfredo Martinez, MD;  Location: [facility];  Service: Urology;  Laterality: N/A;   DILATION AND CURETTAGE OF UTERUS     ESOPHAGOGASTRODUODENOSCOPY (EGD) WITH PROPOFOL N/A 06/24/2019   Procedure: ESOPHAGOGASTRODUODENOSCOPY (EGD) WITH PROPOFOL;  Surgeon: Toledo, Boykin Nearing, MD;  Location: [facility];  Service: Gastroenterology;  Laterality: N/A;   INGUINAL HERNIA REPAIR Bilateral 04/18/2017   Procedure: LAPAROSCOPIC BILATERAL INGUINAL HERNIA REPAIR;  Surgeon: Leafy Ro, MD;  Location: [facility];  Service: General;  Laterality: Bilateral;   INTERSTIM IMPLANT PLACEMENT N/A 11/26/2016   Procedure: Leane Platt IMPLANT FIRST STAGE;  Surgeon: Alfredo Martinez, MD;  Location: [facility];  Service: Urology;  Laterality: N/A;   INTERSTIM IMPLANT PLACEMENT N/A 11/26/2016   Procedure: Leane Platt IMPLANT SECOND STAGE;  Surgeon: Alfredo Martinez, MD;  Location: [facility];  Service: Urology;  Laterality: N/A;   JOINT REPLACEMENT Right    knee   KNEE ARTHROSCOPY  Bilateral    KNEE ARTHROSCOPY WITH LATERAL RELEASE Left 10/18/2015   Procedure: KNEE ARTHROSCOPY LATERAL AND PARTIAL SYNOVECTOMY;  Surgeon: Kennedy Bucker, MD;  Location: [facility];  Service: Orthopedics;  Laterality: Left;   Lymph Node removal  2015   Neck   PUBOVAGINAL SLING N/A 04/09/2016   Procedure: PUBO-VAGINAL SLING/ RETROPUBIC SLING;  Surgeon: Alfredo Martinez, MD;  Location: [facility];  Service: Urology;  Laterality: N/A;   REPLACEMENT TOTAL KNEE Right    SHOULDER SURGERY Right 2014   TUBAL LIGATION     WRIST SURGERY Right    metal plate    Social History   Socioeconomic History   Marital status: Divorced    Spouse name: Not on file   Number of children: Not on file   Years of education: Not on file   Highest education level: Not on file  Occupational History   Occupation: Customer  Service    Employer: [employer]  Tobacco Use   Smoking status: Former    Packs/day: 0.50    Years: 10.00    Additional pack years: 0.00    Total pack years: 5.00    Types: Cigarettes    Start date: 07/16/2008    Quit date: 07/16/2019    Years since quitting: 3.3    Passive exposure: Past   Smokeless tobacco: Former   Tobacco comments:    STRESS RELATED  Vaping Use   Vaping Use: Never used  Substance and Sexual Activity   Alcohol use: Not Currently    Comment: Hisotry of ETOH abouse ASB patient   Drug use: Yes    Types: Other-see comments    Comment: Pt listed history of abuse of "prescription drugs"   Sexual activity: Not Currently  Other Topics Concern   Not on file  Social History Narrative   Not on file   Social Determinants of Health   Financial Resource Strain: Not on file  Food Insecurity: Not on file  Transportation Needs: Not on file  Physical Activity: Not on file  Stress: Not on file  Social Connections: Not on file  Intimate Partner Violence: Not on file    Family History  Adopted: Yes  Family history unknown: Yes    Allergies  Allergen Reactions   Geodon [Ziprasidone Hydrochloride] Other (See Comments)    Numbness ,sob, headaches, blurred vision   Sulfacetamide Sodium Hives   Ziprasidone Anaphylaxis    Other reaction(s): Other (See Comments) Numbness ,sob, headaches, blurred vision  Other reaction(s): Other (See Comments), Unknown Numbness ,sob, headaches, blurred vision Numbness ,sob, headaches, blurred vision  Other reaction(s): Other (See Comments), Unknown Numbness ,sob, headaches, blurred vision Numbness ,sob, headaches, blurred vision   Numbness ,sob, headaches, blurred vision Other reaction(s): Other (See Comments), Unknown Numbness ,sob, headaches, blurred vision Numbness ,sob, headaches, blurred vision Other reaction(s): Other (See Comments), Unknown Numbness ,sob, headaches, blurred vision Numbness ,sob, headaches, blurred  vision   Ziprasidone Hcl Anaphylaxis and Other (See Comments)    Other reaction(s): Other (See Comments), Unknown Numbness ,sob, headaches, blurred vision Numbness ,sob, headaches, blurred vision   Ace Inhibitors Rash   Erythromycin Rash and Hives   Hydromorphone Rash   Lamictal [Lamotrigine] Rash    Other reaction(s): Unknown   Strawberry (Diagnostic) Rash   Sulfa Antibiotics Hives and Rash    Other reaction(s): Unknown Other reaction(s): Unknown Other reaction(s): Unknown    Review of Systems  Constitutional: Negative.   HENT: Negative.    Eyes: Negative.   Respiratory: Negative.  Cardiovascular: Negative.   Gastrointestinal: Negative.   Genitourinary: Negative.   Musculoskeletal:  Positive for joint pain and myalgias.  Skin: Negative.   Neurological: Negative.   Endo/Heme/Allergies: Negative.   Psychiatric/Behavioral: Negative.         Objective:   There were no vitals taken for this visit.  There were no vitals filed for this visit.  Physical Exam Vitals reviewed.  Constitutional:      Appearance: Normal appearance.  HENT:     Head: Normocephalic.     Nose: Nose normal.     Mouth/Throat:     Mouth: Mucous membranes are moist.  Eyes:     Pupils: Pupils are equal, round, and reactive to light.  Cardiovascular:     Rate and Rhythm: Normal rate and regular rhythm.  Pulmonary:     Effort: Pulmonary effort is normal.     Breath sounds: Normal breath sounds.  Abdominal:     General: Bowel sounds are normal.     Palpations: Abdomen is soft.  Musculoskeletal:        General: Normal range of motion.     Cervical back: Normal range of motion and neck supple.  Skin:    General: Skin is warm and dry.  Neurological:     Mental Status: She is alert and oriented to person, place, and time.  Psychiatric:        Mood and Affect: Mood normal.        Behavior: Behavior normal.      No results found for any visits on 11/26/22.  Recent Results (from the past  2160 hour(s))  Urinalysis, Complete     Status: Abnormal   Collection Time: 09/17/22  8:23 AM  Result Value Ref Range   Specific Gravity, UA 1.025 1.005 - 1.030   pH, UA 5.5 5.0 - 7.5   Color, UA Yellow Yellow   Appearance Ur Hazy (A) Clear   Leukocytes,UA 2+ (A) Negative   Protein,UA Negative Negative/Trace   Glucose, UA Negative Negative   Ketones, UA Negative Negative   RBC, UA Trace (A) Negative   Bilirubin, UA Negative Negative   Urobilinogen, Ur 0.2 0.2 - 1.0 mg/dL   Nitrite, UA Negative Negative   Microscopic Examination See below:   Microscopic Examination     Status: Abnormal   Collection Time: 09/17/22  8:23 AM   Urine  Result Value Ref Range   WBC, UA >30 (A) 0 - 5 /hpf   RBC, Urine 3-10 (A) 0 - 2 /hpf   Epithelial Cells (non renal) 0-10 0 - 10 /hpf   Renal Epithel, UA 0-10 (A) None seen /hpf   Casts Present (A) None seen /lpf   Cast Type Hyaline casts N/A   Mucus, UA Present (A) Not Estab.   Bacteria, UA Many (A) None seen/Few  CULTURE, URINE COMPREHENSIVE     Status: None   Collection Time: 09/17/22  8:40 AM   Specimen: Urine   UR  Result Value Ref Range   Urine Culture, Comprehensive Final report    Organism ID, Bacteria Comment     Comment: Mixed urogenital flora 10,000-25,000 colony forming units per mL   Urinalysis, Complete     Status: Abnormal   Collection Time: 10/01/22  9:20 AM  Result Value Ref Range   Specific Gravity, UA >1.030 (H) 1.005 - 1.030   pH, UA 5.5 5.0 - 7.5   Color, UA Yellow Yellow   Appearance Ur Cloudy (A) Clear   Leukocytes,UA 1+ (  A) Negative   Protein,UA 1+ (A) Negative/Trace   Glucose, UA Negative Negative   Ketones, UA Negative Negative   RBC, UA Trace (A) Negative   Bilirubin, UA Negative Negative   Urobilinogen, Ur 0.2 0.2 - 1.0 mg/dL   Nitrite, UA Negative Negative   Microscopic Examination See below:   Microscopic Examination     Status: Abnormal   Collection Time: 10/01/22  9:20 AM   Urine  Result Value Ref Range    WBC, UA >30 (A) 0 - 5 /hpf   RBC, Urine 3-10 (A) 0 - 2 /hpf   Epithelial Cells (non renal) >10 (A) 0 - 10 /hpf   Mucus, UA Present (A) Not Estab.   Bacteria, UA Many (A) None seen/Few  CBC With Differential     Status: None   Collection Time: 10/08/22  4:02 PM  Result Value Ref Range   WBC 8.9 3.4 - 10.8 x10E3/uL   RBC 3.95 3.77 - 5.28 x10E6/uL   Hemoglobin 11.4 11.1 - 15.9 g/dL   Hematocrit 40.9 81.1 - 46.6 %   MCV 87 79 - 97 fL   MCH 28.9 26.6 - 33.0 pg   MCHC 33.1 31.5 - 35.7 g/dL   RDW 91.4 78.2 - 95.6 %   Neutrophils 59 Not Estab. %   Lymphs 29 Not Estab. %   Monocytes 8 Not Estab. %   Eos 3 Not Estab. %   Basos 1 Not Estab. %   Neutrophils Absolute 5.3 1.4 - 7.0 x10E3/uL   Lymphocytes Absolute 2.6 0.7 - 3.1 x10E3/uL   Monocytes Absolute 0.7 0.1 - 0.9 x10E3/uL   EOS (ABSOLUTE) 0.2 0.0 - 0.4 x10E3/uL   Basophils Absolute 0.1 0.0 - 0.2 x10E3/uL   Immature Granulocytes 0 Not Estab. %   Immature Grans (Abs) 0.0 0.0 - 0.1 x10E3/uL  Hemoglobin A1c     Status: Abnormal   Collection Time: 10/08/22  4:02 PM  Result Value Ref Range   Hgb A1c MFr Bld 6.0 (H) 4.8 - 5.6 %    Comment:          Prediabetes: 5.7 - 6.4          Diabetes: >6.4          Glycemic control for adults with diabetes: <7.0    Est. average glucose Bld gHb Est-mCnc 126 mg/dL  TSH     Status: Abnormal   Collection Time: 10/08/22  4:02 PM  Result Value Ref Range   TSH 15.200 (H) 0.450 - 4.500 uIU/mL  CMP14+EGFR     Status: None   Collection Time: 10/08/22  4:02 PM  Result Value Ref Range   Glucose 94 70 - 99 mg/dL   BUN 12 6 - 24 mg/dL   Creatinine, Ser 2.13 0.57 - 1.00 mg/dL   eGFR 086 >57 QI/ONG/2.95   BUN/Creatinine Ratio 18 9 - 23   Sodium 141 134 - 144 mmol/L   Potassium 4.2 3.5 - 5.2 mmol/L   Chloride 101 96 - 106 mmol/L   CO2 27 20 - 29 mmol/L   Calcium 9.1 8.7 - 10.2 mg/dL   Total Protein 6.3 6.0 - 8.5 g/dL   Albumin 3.9 3.8 - 4.9 g/dL   Globulin, Total 2.4 1.5 - 4.5 g/dL    Albumin/Globulin Ratio 1.6 1.2 - 2.2   Bilirubin Total 0.3 0.0 - 1.2 mg/dL   Alkaline Phosphatase 118 44 - 121 IU/L   AST 20 0 - 40 IU/L   ALT 23 0 -  32 IU/L  Lipid panel     Status: Abnormal   Collection Time: 10/08/22  4:02 PM  Result Value Ref Range   Cholesterol, Total 196 100 - 199 mg/dL   Triglycerides 96 0 - 149 mg/dL   HDL 66 >82 mg/dL   VLDL Cholesterol Cal 17 5 - 40 mg/dL   LDL Chol Calc (NIH) 956 (H) 0 - 99 mg/dL   Chol/HDL Ratio 3.0 0.0 - 4.4 ratio    Comment:                                   T. Chol/HDL Ratio                                             Men  Women                               1/2 Avg.Risk  3.4    3.3                                   Avg.Risk  5.0    4.4                                2X Avg.Risk  9.6    7.1                                3X Avg.Risk 23.4   11.0   VAS [name] ABI WITH/WO TBI     Status: None   Collection Time: 11/01/22  7:52 AM  Result Value Ref Range   Right ABI 1.15    Left ABI 1.27       Assessment & Plan:   Problem List Items Addressed This Visit   None   No follow-ups on file.   Total time spent: 35 minutes  Orson Eva, NP  11/26/2022

## 2022-11-27 DIAGNOSIS — Z79899 Other long term (current) drug therapy: Secondary | ICD-10-CM | POA: Diagnosis not present

## 2022-11-28 ENCOUNTER — Ambulatory Visit (INDEPENDENT_AMBULATORY_CARE_PROVIDER_SITE_OTHER): Payer: 59 | Admitting: Physician Assistant

## 2022-11-28 ENCOUNTER — Encounter: Payer: Self-pay | Admitting: Physician Assistant

## 2022-11-28 VITALS — BP 127/82 | HR 91 | Temp 98.2°F | Ht 64.0 in | Wt 243.0 lb

## 2022-11-28 DIAGNOSIS — R202 Paresthesia of skin: Secondary | ICD-10-CM | POA: Diagnosis not present

## 2022-11-28 DIAGNOSIS — R197 Diarrhea, unspecified: Secondary | ICD-10-CM

## 2022-11-28 DIAGNOSIS — K580 Irritable bowel syndrome with diarrhea: Secondary | ICD-10-CM | POA: Diagnosis not present

## 2022-11-28 DIAGNOSIS — R200 Anesthesia of skin: Secondary | ICD-10-CM | POA: Diagnosis not present

## 2022-11-28 DIAGNOSIS — K219 Gastro-esophageal reflux disease without esophagitis: Secondary | ICD-10-CM

## 2022-11-28 DIAGNOSIS — R1310 Dysphagia, unspecified: Secondary | ICD-10-CM

## 2022-11-28 DIAGNOSIS — R29818 Other symptoms and signs involving the nervous system: Secondary | ICD-10-CM | POA: Diagnosis not present

## 2022-11-28 DIAGNOSIS — G2581 Restless legs syndrome: Secondary | ICD-10-CM | POA: Diagnosis not present

## 2022-11-28 MED ORDER — FAMOTIDINE 20 MG PO TABS
20.0000 mg | ORAL_TABLET | Freq: Two times a day (BID) | ORAL | 1 refills | Status: DC
Start: 2022-11-28 — End: 2024-04-30

## 2022-11-28 MED ORDER — ESOMEPRAZOLE MAGNESIUM 40 MG PO CPDR: 40.0000 mg | DELAYED_RELEASE_CAPSULE | Freq: Two times a day (BID) | ORAL | 1 refills | Status: AC

## 2022-11-28 NOTE — Progress Notes (Signed)
Celso Amy, PA-C [address]  [address]  [place], [place] [address]  Main: [phone]  Fax: [phone]   Gastroenterology Consultation  Referring Provider:     Margaretann Loveless, MD Primary Care Physician:  Margaretann Loveless, MD Primary Gastroenterologist:  Celso Amy, PA-C / Dr. Wyline Mood  Reason for Consultation:     Dysphagia, GERD, Diarrhea        HPI:   Alexis Christensen is a 52 y.o. y/o female referred for consultation & management  by Margaretann Loveless, MD. previous patient of [facility], Dr. Norma Fredrickson.  Transferring GI care to our office.  EGD done by Dr. Norma Fredrickson 06/2019, for GERD and nausea, showed a 2 cm hiatal hernia, and mild gastritis, otherwise normal.  Biopsies were negative for H. pylori, metaplasia, or dysplasia.  No evidence of Barrett's.  Colonoscopy 06/2019, done to evaluate diarrhea & bleeding, showed mild diverticulosis, 1 small 5 mm tubular adenoma polyp removed from ascending colon, and small internal hemorrhoids.  Biopsies were negative for microscopic colitis.  No evidence of IBD.  Last abdominal pelvic CT 07/2020 showed no acute abnormality.  No recent barium swallow test.  Recent labs 11/06/2022 showed normal CBC (hemoglobin 12.6, WBC 5.6), and normal CMP.  Elevated TSH 11.29.  She takes Colestid to help control chronic diarrhea.  Previous cholecystectomy.  Takes Dicyclomine 10 mg 3 times daily as needed abdominal cramping and IBS.  States she has history of C. difficile many years ago.  She has history of recurrent UTIs and has been on antibiotics recently prescribed from urology.  She reports having increased watery diarrhea for 1 month, not controlled on Colestid.  Denies rectal bleeding or weight loss.  She reports increasing dysphagia and acid reflux for 1 month.  She regurgitates acid and food after eating.  Is currently on pantoprazole 40 mg twice daily which is not controlling her reflux.  She avoids spicy foods.  She has no upper teeth.  She  feels like food occasionally gets stuck in her mid upper chest.  Past Medical History:  Diagnosis Date   Acute diastolic (congestive) heart failure (HCC)    ADHD    Adopted    Anemia    Anxiety    Arthritis    Asthma    B12 deficiency    Bilateral swelling of feet and ankles    Bipolar 1 disorder (HCC)    Bipolar disorder (HCC)    Carpal tunnel syndrome 09/15/2014   Chest pain    CHF (congestive heart failure) (HCC)    Dialostic CHF   Chronic diarrhea 09/07/2015   Chronic kidney disease    H/O KIDNEY STONES   Chronic venous insufficiency 04/26/2016   Depression    DVT (deep venous thrombosis) (HCC) 2016   RIGHT LEG   Edema leg    Effusion of knee 12/30/2013   Family history of adverse reaction to anesthesia    ADOPTED   Food allergy    GERD (gastroesophageal reflux disease)    Goiter    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   Headache(784.0)    MIGRAINES   Heart burn    Heart murmur    History of blood clots    History of kidney stones    HLD (hyperlipidemia)    Hx MRSA infection    Hyperlipidemia    Hypothyroidism    Lipoma of arm 05/11/2013   Lower extremity edema    Lymphedema 04/26/2016   Other fatigue  Post traumatic stress disorder (PTSD)    Prediabetes    PVD (peripheral vascular disease) (HCC)    Seasonal allergies    Shortness of breath    Shortness of breath on exertion    Stress incontinence    Thyroid disease    Urge incontinence    Vitamin D deficiency     Past Surgical History:  Procedure Laterality Date   ABDOMINAL HYSTERECTOMY     ANKLE SURGERY     CHOLECYSTECTOMY     COLONOSCOPY WITH PROPOFOL N/A 06/24/2019   Procedure: COLONOSCOPY WITH PROPOFOL;  Surgeon: Toledo, Boykin Nearing, MD;  Location: [facility];  Service: Gastroenterology;  Laterality: N/A;   CYSTOSCOPY N/A 04/09/2016   Procedure: CYSTOSCOPY;  Surgeon: Alfredo Martinez, MD;  Location: [facility];  Service: Urology;  Laterality: N/A;   DILATION AND CURETTAGE OF UTERUS      ESOPHAGOGASTRODUODENOSCOPY (EGD) WITH PROPOFOL N/A 06/24/2019   Procedure: ESOPHAGOGASTRODUODENOSCOPY (EGD) WITH PROPOFOL;  Surgeon: Toledo, Boykin Nearing, MD;  Location: [facility];  Service: Gastroenterology;  Laterality: N/A;   INGUINAL HERNIA REPAIR Bilateral 04/18/2017   Procedure: LAPAROSCOPIC BILATERAL INGUINAL HERNIA REPAIR;  Surgeon: Leafy Ro, MD;  Location: [facility];  Service: General;  Laterality: Bilateral;   INTERSTIM IMPLANT PLACEMENT N/A 11/26/2016   Procedure: Leane Platt IMPLANT FIRST STAGE;  Surgeon: Alfredo Martinez, MD;  Location: [facility];  Service: Urology;  Laterality: N/A;   INTERSTIM IMPLANT PLACEMENT N/A 11/26/2016   Procedure: Leane Platt IMPLANT SECOND STAGE;  Surgeon: Alfredo Martinez, MD;  Location: [facility];  Service: Urology;  Laterality: N/A;   JOINT REPLACEMENT Right    knee   KNEE ARTHROSCOPY Bilateral    KNEE ARTHROSCOPY WITH LATERAL RELEASE Left 10/18/2015   Procedure: KNEE ARTHROSCOPY LATERAL AND PARTIAL SYNOVECTOMY;  Surgeon: Kennedy Bucker, MD;  Location: [facility];  Service: Orthopedics;  Laterality: Left;   Lymph Node removal  2015   Neck   PUBOVAGINAL SLING N/A 04/09/2016   Procedure: PUBO-VAGINAL SLING/ RETROPUBIC SLING;  Surgeon: Alfredo Martinez, MD;  Location: [facility];  Service: Urology;  Laterality: N/A;   REPLACEMENT TOTAL KNEE Right    SHOULDER SURGERY Right 2014   TUBAL LIGATION     WRIST SURGERY Right    metal plate    Prior to Admission medications   Medication Sig Start Date End Date Taking? Authorizing Provider  albuterol (VENTOLIN HFA) 108 (90 Base) MCG/ACT inhaler Inhale 1 puff into the lungs every 6 (six) hours as needed for wheezing or shortness of breath.    [provider]  ARIPiprazole (ABILIFY) 30 MG tablet Take 30 mg by mouth daily. 09/04/22   [provider]  aspirin 81 MG chewable tablet Aspirin 81 MG Oral Tablet Chewable QTY: 30 tablet Days: 30 Refills: 0  Written: 01/23/21 Patient Instructions: once a day  01/23/21   [provider]  benzonatate (TESSALON PERLES) 100 MG capsule Take 1 capsule (100 mg total) by mouth 3 (three) times daily as needed for cough. Patient not taking: Reported on 10/31/2022 10/01/22 10/01/23  Miki Kins, FNP  Budeson-Glycopyrrol-Formoterol (BREZTRI AEROSPHERE) 160-9-4.8 MCG/ACT AERO Inhale 2 puffs into the lungs in the morning and at bedtime.    [provider]  colestipol (COLESTID) 1 g tablet Take 2 g by mouth 2 (two) times daily.    [provider]  cyanocobalamin (VITAMIN B12) 500 MCG tablet Take 1 tablet (500 mcg total) by mouth daily. 07/03/22   Opalski, Gavin Pound, DO  dicyclomine (BENTYL) 10 MG capsule TAKE 1 CAPSULE BY  MOUTH EVERY 8 HOURS AS NEEDED FOR ABDOMINAL PAIN 10/09/22   Orson Eva, NP  diphenoxylate-atropine (LOMOTIL) 2.5-0.025 MG tablet Take 1 tablet by mouth 4 (four) times daily as needed for diarrhea or loose stools. 10/01/22 10/01/23  Miki Kins, FNP  doxycycline (VIBRA-TABS) 100 MG tablet Take 1 tablet (100 mg total) by mouth 2 (two) times daily. 10/31/22   Orson Eva, NP  DULoxetine (CYMBALTA) 60 MG capsule Take 60 mg by mouth 2 (two) times daily.    [provider]  Evolocumab (REPATHA SURECLICK) 140 MG/ML SOAJ Inject into the skin.    [provider]  furosemide (LASIX) 40 MG tablet TAKE 1 TABLET BY MOUTH EVERY DAY 10/09/22   Laurier Nancy, MD  gabapentin (NEURONTIN) 600 MG tablet Take 600 mg by mouth 2 (two) times daily.     [provider]  hydrOXYzine (ATARAX/VISTARIL) 25 MG tablet Take 50 mg by mouth every 8 (eight) hours as needed for itching.    [provider]  KLOR-CON M10 10 MEQ tablet Take 10 mEq by mouth daily. 03/22/22   [provider]  levothyroxine (SYNTHROID) 200 MCG tablet PLEASE SEE ATTACHED FOR DETAILED DIRECTIONS 09/06/22   [provider]  liothyronine (CYTOMEL) 5 MCG tablet Take 5 mcg by mouth daily. 05/22/22 05/22/23  [provider]  lisinopril (ZESTRIL) 5 MG tablet TAKE 1 TABLET BY MOUTH EVERY DAY 11/06/22   Laurier Nancy, MD  metFORMIN (GLUCOPHAGE) 500 MG tablet Take 1 tablet (500 mg total) by mouth 3 (three) times daily. 08/28/22   Opalski, Gavin Pound, DO  methocarbamol (ROBAXIN) 500 MG tablet Take 1 tablet (500 mg total) by mouth 3 (three) times daily. 11/19/22   Orson Eva, NP  metoprolol tartrate (LOPRESSOR) 50 MG tablet Take 50 mg by mouth 2 (two) times daily.    [provider]  montelukast (SINGULAIR) 10 MG tablet Take 10 mg by mouth daily. 10/10/22   [provider]  mupirocin ointment (BACTROBAN) 2 % Apply 1 Application topically 2 (two) times daily. 10/31/22   Orson Eva, NP  nitrofurantoin, macrocrystal-monohydrate, (MACROBID) 100 MG capsule Take 1 capsule (100 mg total) by mouth 2 (two) times daily. 11/26/22   Orson Eva, NP  nortriptyline (PAMELOR) 10 MG capsule Take 10 mg in the morning 09/11/22   [provider]  nystatin powder Apply 1 Application topically 3 (three) times daily. 10/23/22   Orson Eva, NP  ondansetron (ZOFRAN) 4 MG tablet Take 4 mg by mouth every 8 (eight) hours as needed. 09/27/22   [provider]  pantoprazole (PROTONIX) 40 MG tablet TAKE 1 TABLET BY MOUTH TWICE A DAY 11/05/22   Orson Eva, NP  predniSONE (DELTASONE) 50 MG tablet Take 1 tablet by mouth daily in AM with food x 5 days 11/19/22   Orson Eva, NP  rosuvastatin (CRESTOR) 40 MG tablet Take 40 mg by mouth daily.    [provider]  Semaglutide (RYBELSUS) 3 MG TABS Take 1 tablet (3 mg total) by mouth daily. 09/16/22   Bowen, Scot [name], DO  spironolactone (ALDACTONE) 25 MG tablet TAKE 1/2 (HALF) TABLET BY MOUTH ONCE DAILY. 09/04/22   Laurier Nancy, MD  SUBOXONE 8-2 MG FILM Dissolve 1 film under tongue twice daily 09/14/16   [provider]  Vitamin D, Ergocalciferol, (DRISDOL) 1.25 MG (50000 UNIT) CAPS capsule 1 po q wed and 1 po q sun 08/28/22   Thomasene Lot, DO     Family History  Adopted: Yes  Family  history unknown: Yes     Social History   Tobacco Use   Smoking status: Former    Packs/day: 0.50    Years: 10.00    Additional pack years: 0.00    Total pack years: 5.00    Types: Cigarettes    Start date: 07/16/2008    Quit date: 07/16/2019    Years since quitting: 3.3    Passive exposure: Past   Smokeless tobacco: Former   Tobacco comments:    STRESS RELATED  Vaping Use   Vaping Use: Never used  Substance Use Topics   Alcohol use: Not Currently    Comment: Hisotry of ETOH abouse ASB patient   Drug use: Yes    Types: Other-see comments    Comment: Pt listed history of abuse of "prescription drugs"    Allergies as of 11/28/2022 - Review Complete 11/28/2022  Allergen Reaction Noted   Geodon [ziprasidone hydrochloride] Other (See Comments) 06/14/2011   Sulfacetamide sodium Hives 05/26/2014   Ziprasidone Anaphylaxis 06/14/2011   Ziprasidone hcl Anaphylaxis and Other (See Comments) 01/04/2014   Ace inhibitors Rash 05/10/2015   Erythromycin Rash and Hives 03/27/2015   Hydromorphone Rash 05/10/2015   Lamictal [lamotrigine] Rash 01/04/2014   Strawberry (diagnostic) Rash 10/13/2015   Sulfa antibiotics Hives and Rash 06/14/2011    Review of Systems:    All systems reviewed and negative except where noted in HPI.   Physical Exam:  BP 127/82   Pulse 91   Temp 98.2 F (36.8 C)   Ht 5\' 4"  (1.626 m)   Wt 243 lb (110.2 kg)   BMI 41.71 kg/m  No LMP recorded. Patient has had a hysterectomy. Psych:  Alert and cooperative. Normal mood and affect. General:   Alert,  Well-developed, well-nourished, pleasant and cooperative in NAD Head:  Normocephalic and atraumatic. Eyes:  Sclera clear, no icterus.   Conjunctiva pink. Neck:  Supple; no masses or thyromegaly. Lungs:  Respirations even and unlabored.  Clear throughout to auscultation.   No wheezes, crackles, or rhonchi. No acute distress. Heart:  Regular rate and rhythm; no murmurs,  clicks, rubs, or gallops. Abdomen:  Normal bowel sounds.  No bruits.  Soft, and non-distended without masses, hepatosplenomegaly or hernias noted.  No Tenderness.  No guarding or rebound tenderness.    Neurologic:  Alert and oriented x3;  grossly normal neurologically. Psych:  Alert and cooperative. Normal mood and affect.   Assessment and Plan:   Alexis Christensen is a 52 y.o. y/o female has been referred for Multiple GI symptoms.  History of IBS, Chronic Diarrhea, and GERD.  Has been on recent antibiotics for UTIs.  I am ordering stool studies to rule out C. difficile or other infection.  Colonoscopy in 2020 showed biopsies negative for microscopic colitis or IBD.  She is having some dysphagia which I think may be due to acid reflux.  I am ordering barium swallow with tablet for evaluation.  EGD in 2020 showed no evidence of esophageal stricture.  Adjusting treatment for GERD with follow-up.  Dysphagia  Ordering Barium Swallow with Tablet  If barium swallow test shows esophageal stricture, then I will schedule EGD with dilation.  GERD  Stop Pantoprazole which could cause diarrhea.  Start Nexium 40mg  BID.  Start Famotidine 20mg  BID.  Recommend Lifestyle Modifications to prevent Acid Reflux.  Rec. Avoid coffee, sodas, peppermint, citrus fruits, and spicey foods.  Avoid eating 2-3 hours before bedtime.   Diarrhea  Stool Studies:  GI Pathgen Panal; C. Diff PCR.  History of chronic diarrhea.  Rule out C. difficile or other infection given her recent antibiotic use.  IBS-D  Continue dicyclomine 10 mg 4 times daily and Colestid 1 g 2 to 3 tablets daily.  Follow up in 1 month.  Celso Amy, PA-C

## 2022-11-28 NOTE — Patient Instructions (Addendum)
Barium Swallow scheduled @ [facility] on 12/05/22 @ 8:45 arrival. Nothing to eat/drink 4 hours.   Food Choices for Gastroesophageal Reflux Disease, Adult When you have gastroesophageal reflux disease (GERD), the foods you eat and your eating habits are very important. Choosing the right foods can help ease your discomfort. Think about working with a food expert (dietitian) to help you make good choices. What are tips for following this plan? Reading food labels Look for foods that are low in saturated fat. Foods that may help with your symptoms include: Foods that have less than 5% of daily value (DV) of fat. Foods that have 0 grams of trans fat. Cooking Do not fry your food. Cook your food by baking, steaming, grilling, or broiling. These are all methods that do not need a lot of fat for cooking. To add flavor, try to use herbs that are low in spice and acidity. Meal planning  Choose healthy foods that are low in fat, such as: Fruits and vegetables. Whole grains. Low-fat dairy products. Lean meats, fish, and poultry. Eat small meals often instead of eating 3 large meals each day. Eat your meals slowly in a place where you are relaxed. Avoid bending over or lying down until 2-3 hours after eating. Limit high-fat foods such as fatty meats or fried foods. Limit your intake of fatty foods, such as oils, butter, and shortening. Avoid the following as told by your doctor: Foods that cause symptoms. These may be different for different people. Keep a food diary to keep track of foods that cause symptoms. Alcohol. Drinking a lot of liquid with meals. Eating meals during the 2-3 hours before bed. Lifestyle Stay at a healthy weight. Ask your doctor what weight is healthy for you. If you need to lose weight, work with your doctor to do so safely. Exercise for at least 30 minutes on 5 or more days each week, or as told by your doctor. Wear loose-fitting clothes. Do not smoke or use any products that  contain nicotine or tobacco. If you need help quitting, ask your doctor. Sleep with the head of your bed higher than your feet. Use a wedge under the mattress or blocks under the bed frame to raise the head of the bed. Chew sugar-free gum after meals. What foods should eat?  Eat a healthy, well-balanced diet of fruits, vegetables, whole grains, low-fat dairy products, lean meats, fish, and poultry. Each person is different. Foods that may cause symptoms in one person may not cause any symptoms in another person. Work with your doctor to find foods that are safe for you. The items listed above may not be a complete list of what you can eat and drink. Contact a food expert for more options. What foods should I avoid? Limiting some of these foods may help in managing the symptoms of GERD. Everyone is different. Talk with a food expert or your doctor to help you find the exact foods to avoid, if any. Fruits Any fruits prepared with added fat. Any fruits that cause symptoms. For some people, this may include citrus fruits, such as oranges, grapefruit, pineapple, and lemons. Vegetables Deep-fried vegetables. [place] fries. Any vegetables prepared with added fat. Any vegetables that cause symptoms. For some people, this may include tomatoes and tomato products, chili peppers, onions and garlic, and horseradish. Grains Pastries or quick breads with added fat. Meats and other proteins High-fat meats, such as fatty beef or pork, hot dogs, ribs, ham, sausage, salami, and bacon. Fried meat  or protein, including fried fish and fried chicken. Nuts and nut butters, in large amounts. Dairy Whole milk and chocolate milk. Sour cream. Cream. Ice cream. Cream cheese. Milkshakes. Fats and oils Butter. Margarine. Shortening. Ghee. Beverages Coffee and tea, with or without caffeine. Carbonated beverages. Sodas. Energy drinks. Fruit juice made with acidic fruits, such as orange or grapefruit. Tomato juice. Alcoholic  drinks. Sweets and desserts Chocolate and cocoa. Donuts. Seasonings and condiments Pepper. Peppermint and spearmint. Added salt. Any condiments, herbs, or seasonings that cause symptoms. For some people, this may include curry, hot sauce, or vinegar-based salad dressings. The items listed above may not be a complete list of what you should not eat and drink. Contact a food expert for more options. Questions to ask your doctor Diet and lifestyle changes are often the first steps that are taken to manage symptoms of GERD. If diet and lifestyle changes do not help, talk with your doctor about taking medicines. Where to find more information [employer] G[employer]intestinal [employer]: aboutgerd.org Summary When you have GERD, food and lifestyle choices are very important in easing your symptoms. Eat small meals often instead of 3 large meals a day. Eat your meals slowly and in a place where you are relaxed. Avoid bending over or lying down until 2-3 hours after eating. Limit high-fat foods such as fatty meats or fried foods. This information is not intended to replace advice given to you by your health care provider. Make sure you discuss any questions you have with your health care provider. Document Revised: 01/11/2020 Document Reviewed: 01/11/2020 Elsevier Patient Education  2023 Elsevier Inc. 12/05/22 @

## 2022-12-03 ENCOUNTER — Telehealth: Payer: Self-pay

## 2022-12-03 NOTE — Telephone Encounter (Signed)
Left detailed message asking if patient is planning to have labs done within the next few days and if not to please let us know.  Patient has not completed stool studies GI pathogen panel and C. difficile tests.  Please call and notify pt. If she is still having diarrhea, then I recommend she go ahead and complete stool studies.  If diarrhea has resolved, then we can cancel stool studies.  Alexis Christensen

## 2022-12-05 ENCOUNTER — Other Ambulatory Visit: Payer: 59

## 2022-12-07 ENCOUNTER — Other Ambulatory Visit: Payer: Self-pay | Admitting: Nurse Practitioner

## 2022-12-07 ENCOUNTER — Ambulatory Visit (INDEPENDENT_AMBULATORY_CARE_PROVIDER_SITE_OTHER): Payer: 59 | Admitting: Nurse Practitioner

## 2022-12-07 DIAGNOSIS — B3731 Acute candidiasis of vulva and vagina: Secondary | ICD-10-CM

## 2022-12-07 LAB — POCT URINALYSIS DIPSTICK
Bilirubin, UA: NEGATIVE
Blood, UA: NEGATIVE
Glucose, UA: NEGATIVE
Ketones, UA: NEGATIVE
Nitrite, UA: NEGATIVE
Protein, UA: NEGATIVE
Spec Grav, UA: 1.02 (ref 1.010–1.025)
Urobilinogen, UA: 0.2 E.U./dL
pH, UA: 6 (ref 5.0–8.0)

## 2022-12-07 MED ORDER — CIPROFLOXACIN HCL 500 MG PO TABS
500.0000 mg | ORAL_TABLET | Freq: Two times a day (BID) | ORAL | 0 refills | Status: DC
Start: 2022-12-07 — End: 2022-12-17

## 2022-12-11 DIAGNOSIS — Z79899 Other long term (current) drug therapy: Secondary | ICD-10-CM | POA: Diagnosis not present

## 2022-12-11 DIAGNOSIS — M25512 Pain in left shoulder: Secondary | ICD-10-CM | POA: Diagnosis not present

## 2022-12-11 DIAGNOSIS — I83009 Varicose veins of unspecified lower extremity with ulcer of unspecified site: Secondary | ICD-10-CM | POA: Diagnosis not present

## 2022-12-11 DIAGNOSIS — G8929 Other chronic pain: Secondary | ICD-10-CM | POA: Diagnosis not present

## 2022-12-11 DIAGNOSIS — M7582 Other shoulder lesions, left shoulder: Secondary | ICD-10-CM | POA: Diagnosis not present

## 2022-12-11 DIAGNOSIS — L97909 Non-pressure chronic ulcer of unspecified part of unspecified lower leg with unspecified severity: Secondary | ICD-10-CM | POA: Diagnosis not present

## 2022-12-14 ENCOUNTER — Ambulatory Visit
Admission: RE | Admit: 2022-12-14 | Discharge: 2022-12-14 | Disposition: A | Discharge status: Home / Self Care | Payer: 59 | Source: Ambulatory Visit | Attending: Physician Assistant | Admitting: Physician Assistant

## 2022-12-14 DIAGNOSIS — H8113 Benign paroxysmal vertigo, bilateral: Secondary | ICD-10-CM | POA: Diagnosis not present

## 2022-12-14 DIAGNOSIS — R293 Abnormal posture: Secondary | ICD-10-CM | POA: Diagnosis not present

## 2022-12-14 DIAGNOSIS — K219 Gastro-esophageal reflux disease without esophagitis: Secondary | ICD-10-CM

## 2022-12-14 DIAGNOSIS — M5441 Lumbago with sciatica, right side: Secondary | ICD-10-CM | POA: Diagnosis not present

## 2022-12-14 DIAGNOSIS — R1310 Dysphagia, unspecified: Secondary | ICD-10-CM | POA: Diagnosis not present

## 2022-12-14 DIAGNOSIS — K224 Dyskinesia of esophagus: Secondary | ICD-10-CM | POA: Diagnosis not present

## 2022-12-14 DIAGNOSIS — G8929 Other chronic pain: Secondary | ICD-10-CM | POA: Diagnosis not present

## 2022-12-14 DIAGNOSIS — K449 Diaphragmatic hernia without obstruction or gangrene: Secondary | ICD-10-CM | POA: Diagnosis not present

## 2022-12-14 NOTE — Progress Notes (Signed)
Notify patient barium swallow test shows moderate hiatal hernia and large volume of acid reflux.  Barium tablet passed with no delay.  No evidence of esophageal stricture.  Continue with plan to take Nexium 40 mg twice daily and famotidine 20 Mg twice daily.  Follow-up office visit 1 month scheduled.

## 2022-12-17 ENCOUNTER — Ambulatory Visit (INDEPENDENT_AMBULATORY_CARE_PROVIDER_SITE_OTHER): Payer: 59 | Admitting: Urology

## 2022-12-17 ENCOUNTER — Telehealth: Payer: Self-pay

## 2022-12-17 ENCOUNTER — Ambulatory Visit: Payer: 59 | Admitting: Urology

## 2022-12-17 VITALS — BP 110/74 | HR 81 | Ht 64.0 in | Wt 230.0 lb

## 2022-12-17 DIAGNOSIS — Z87440 Personal history of urinary (tract) infections: Secondary | ICD-10-CM

## 2022-12-17 DIAGNOSIS — N3020 Other chronic cystitis without hematuria: Secondary | ICD-10-CM

## 2022-12-17 DIAGNOSIS — N3946 Mixed incontinence: Secondary | ICD-10-CM | POA: Diagnosis not present

## 2022-12-17 DIAGNOSIS — R82998 Other abnormal findings in urine: Secondary | ICD-10-CM | POA: Diagnosis not present

## 2022-12-17 DIAGNOSIS — R197 Diarrhea, unspecified: Secondary | ICD-10-CM | POA: Diagnosis not present

## 2022-12-17 DIAGNOSIS — R351 Nocturia: Secondary | ICD-10-CM | POA: Diagnosis not present

## 2022-12-17 LAB — MICROSCOPIC EXAMINATION: WBC, UA: >30 /hpf — AB (ref 0–5)

## 2022-12-17 LAB — URINALYSIS, COMPLETE
Bilirubin, UA: NEGATIVE
Glucose, UA: NEGATIVE
Ketones, UA: NEGATIVE
Nitrite, UA: NEGATIVE
Protein,UA: NEGATIVE
Specific Gravity, UA: >1.03 — ABNORMAL HIGH (ref 1.005–1.030)
Urobilinogen, Ur: 0.2 mg/dL (ref 0.2–1.0)
pH, UA: 5.5 (ref 5.0–7.5)

## 2022-12-17 MED ORDER — NITROFURANTOIN MONOHYD MACRO 100 MG PO CAPS
100.0000 mg | ORAL_CAPSULE | Freq: Every day | ORAL | 3 refills | Status: DC
Start: 2022-12-17 — End: 2022-12-24

## 2022-12-17 NOTE — Telephone Encounter (Signed)
  Patient notified and also stated she turned in stool studies on Friday.  Notify patient barium swallow test shows moderate hiatal hernia and large volume of acid reflux.  Barium tablet passed with no delay.  No evidence of esophageal stricture.  Continue with plan to take Nexium 40 mg twice daily and famotidine 20 Mg twice daily.  Follow-up office visit 1 month scheduled.

## 2022-12-17 NOTE — Progress Notes (Signed)
12/17/2022 8:24 AM   Alexis Christensen [birth year] [id number]  Referring provider: Margaretann Loveless, MD [address] [place],  [place] [address]  Chief Complaint  Patient presents with   Follow-up    HPI: The patient had InterStim on 11/26/2016 for persistent overactive bladder and urgency incontinence after sling. The patient no longer has stress incontinence and had failed multiple medications. The need for the InterStim was discussed pre-operatively.   She is continent with intermittent urgency. She used to have mild bedwetting and does not have it either. Frequency also improved.    She stays quite dry during the day.  She gets up a few times at night.  If she holds it too long she can has some foot on the floor syndrome but the device appears to be working very well   Almost completely continent and very pleased.     Patient has not been seen for a few years.   And was doing great until 2 months ago.  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  High-volume leakage.  No cystitis symptoms.  She has never changed the program or amplitude.   Patient has a new onset urge incontinence.  During troubleshooting that was only 1 program.  Impedance was normal.  It was turned up amplitude and she felt it in the vagina.  Maralyn Sago will speak to the representative and start up programming exercise and look into its data   When amplitude was increased patient did well.  She was not leaking.  Patient is completely continent and very pleased.  No infections.  Has not changed the amplitude or any settings.   I will see the patient as needed. Battery life discussed      Patient says she is getting approximately 2 bladder infections month with burning cramping and frequency that respond favorably to antibiotics.  InterStim still works really well.  She thinks she may be infected today.  I did not see recent urine cultures.  She had a CT scan last year that was normal.  She has a number of  allergies   I thought it was best to send in ciprofloxacin 250 mg twice a day for 7 days. Pathophysiology of UTIs discussed. Reassess on Macrodantin 100 mg 30 x 11 in 8 weeks    2 days ago patient was having vague abdominal pain and midline low back pain.  Frequency is stable.  No burning.  No foul-smelling urine.  He was doing well on daily Macrodantin up until this point    The urine did look positive but the symptoms were nonspecific. Because she is symptomatic I called in ciprofloxacin 250 mg twice a day for 7 days. She will see nurse practitioner in 3 weeks and if she did have a breakthrough infection she should be switched to either daily Keflex or perhaps trimethoprim. She gets hives with sulfa drugs. If culture is negative stay on Macrodantin. She will bring her device and she also wants her battery life checked the InterStim   Today Patient saw a nurse practitioner October 01, 2022 with some pelvic cramping but no burning.  There was no low battery issues when her device was interrogated.  Urine culture from my visit in early March normal.  No urine culture from March 18.  It turns out she talk to the [employer] rep and she is 12 to 15 months left on the battery.  Patient had a normal pelvic ultrasound May 09, 2022.  Normal  CT scan in January 2022  She is voiding every 2 hours during the day and is dry.  She is getting up every 2 hours or even 5 or 6 times at night with a strong urge to go.  She is having issues with ankle edema which is new or worsening.  She says she has had a bladder infection since May 8 but her symptoms were nausea vomiting diarrhea and some increased frequency and was treated with 1 or 2 antibiotics from an outside center.  The cramping that she did previously is much better.  In the past the patient had mixed incontinence and prior to having a sling she was aware that her urge incontinence nocturia and bedwetting could persist.  Post InterStim she would still normally  get up a few times a night.  She had failed multiple medications.  For 2 years patient has considered an MRI for her low back.  She would like to be switched to an MRI compatible device which would also address the 69-month battery life issue  Urine looked positive and sent for culture      PMH: Past Medical History:  Diagnosis Date   Acute diastolic (congestive) heart failure (HCC)    ADHD    Adopted    Anemia    Anxiety    Arthritis    Asthma    B12 deficiency    Bilateral swelling of feet and ankles    Bipolar 1 disorder (HCC)    Bipolar disorder (HCC)    Carpal tunnel syndrome 09/15/2014   Chest pain    CHF (congestive heart failure) (HCC)    Dialostic CHF   Chronic diarrhea 09/07/2015   Chronic kidney disease    H/O KIDNEY STONES   Chronic venous insufficiency 04/26/2016   Depression    DVT (deep venous thrombosis) (HCC) 2016   RIGHT LEG   Edema leg    Effusion of knee 12/30/2013   Family history of adverse reaction to anesthesia    ADOPTED   Food allergy    GERD (gastroesophageal reflux disease)    Goiter    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   Headache(784.0)    MIGRAINES   Heart burn    Heart murmur    History of blood clots    History of kidney stones    HLD (hyperlipidemia)    Hx MRSA infection    Hyperlipidemia    Hypothyroidism    Lipoma of arm 05/11/2013   Lower extremity edema    Lymphedema 04/26/2016   Other fatigue    Post traumatic stress disorder (PTSD)    Prediabetes    PVD (peripheral vascular disease) (HCC)    Seasonal allergies    Shortness of breath    Shortness of breath on exertion    Stress incontinence    Thyroid disease    Urge incontinence    Vitamin D deficiency     Surgical History: Past Surgical History:  Procedure Laterality Date   ABDOMINAL HYSTERECTOMY     ANKLE SURGERY     CHOLECYSTECTOMY     COLONOSCOPY WITH PROPOFOL N/A 06/24/2019   Procedure: COLONOSCOPY WITH PROPOFOL;  Surgeon: Toledo, Boykin Nearing, MD;   Location: [facility];  Service: Gastroenterology;  Laterality: N/A;   CYSTOSCOPY N/A 04/09/2016   Procedure: CYSTOSCOPY;  Surgeon: Alfredo Martinez, MD;  Location: [facility];  Service: Urology;  Laterality: N/A;   DILATION AND CURETTAGE OF UTERUS     ESOPHAGOGASTRODUODENOSCOPY (EGD) WITH PROPOFOL N/A 06/24/2019  Procedure: ESOPHAGOGASTRODUODENOSCOPY (EGD) WITH PROPOFOL;  Surgeon: Toledo, Boykin Nearing, MD;  Location: [facility];  Service: Gastroenterology;  Laterality: N/A;   INGUINAL HERNIA REPAIR Bilateral 04/18/2017   Procedure: LAPAROSCOPIC BILATERAL INGUINAL HERNIA REPAIR;  Surgeon: Leafy Ro, MD;  Location: [facility];  Service: General;  Laterality: Bilateral;   INTERSTIM IMPLANT PLACEMENT N/A 11/26/2016   Procedure: Leane Platt IMPLANT FIRST STAGE;  Surgeon: Alfredo Martinez, MD;  Location: [facility];  Service: Urology;  Laterality: N/A;   INTERSTIM IMPLANT PLACEMENT N/A 11/26/2016   Procedure: Leane Platt IMPLANT SECOND STAGE;  Surgeon: Alfredo Martinez, MD;  Location: [facility];  Service: Urology;  Laterality: N/A;   JOINT REPLACEMENT Right    knee   KNEE ARTHROSCOPY Bilateral    KNEE ARTHROSCOPY WITH LATERAL RELEASE Left 10/18/2015   Procedure: KNEE ARTHROSCOPY LATERAL AND PARTIAL SYNOVECTOMY;  Surgeon: Kennedy Bucker, MD;  Location: [facility];  Service: Orthopedics;  Laterality: Left;   Lymph Node removal  2015   Neck   PUBOVAGINAL SLING N/A 04/09/2016   Procedure: PUBO-VAGINAL SLING/ RETROPUBIC SLING;  Surgeon: Alfredo Martinez, MD;  Location: [facility];  Service: Urology;  Laterality: N/A;   REPLACEMENT TOTAL KNEE Right    SHOULDER SURGERY Right 2014   TUBAL LIGATION     WRIST SURGERY Right    metal plate    Home Medications:  Allergies as of 12/17/2022       Reactions   Geodon [ziprasidone Hydrochloride] Other (See Comments)   Numbness ,sob, headaches, blurred vision   Sulfacetamide Sodium Hives   Ziprasidone Anaphylaxis   Other reaction(s): Other (See Comments) Numbness  ,sob, headaches, blurred vision Other reaction(s): Other (See Comments), Unknown Numbness ,sob, headaches, blurred vision Numbness ,sob, headaches, blurred vision Other reaction(s): Other (See Comments), Unknown Numbness ,sob, headaches, blurred vision Numbness ,sob, headaches, blurred vision Numbness ,sob, headaches, blurred vision Other reaction(s): Other (See Comments), Unknown Numbness ,sob, headaches, blurred vision Numbness ,sob, headaches, blurred vision Other reaction(s): Other (See Comments), Unknown Numbness ,sob, headaches, blurred vision Numbness ,sob, headaches, blurred vision   Ziprasidone Hcl Anaphylaxis, Other (See Comments)   Other reaction(s): Other (See Comments), Unknown Numbness ,sob, headaches, blurred vision Numbness ,sob, headaches, blurred vision   Ace Inhibitors Rash   Erythromycin Rash, Hives   Hydromorphone Rash   Lamictal [lamotrigine] Rash   Other reaction(s): Unknown   Strawberry (diagnostic) Rash   Sulfa Antibiotics Hives, Rash   Other reaction(s): Unknown Other reaction(s): Unknown Other reaction(s): Unknown        Medication List        Accurate as of December 17, 2022  8:24 AM. If you have any questions, ask your nurse or doctor.          albuterol 108 (90 Base) MCG/ACT inhaler Commonly known as: VENTOLIN HFA Inhale 1 puff into the lungs every 6 (six) hours as needed for wheezing or shortness of breath.   ARIPiprazole 30 MG tablet Commonly known as: ABILIFY Take 30 mg by mouth daily.   aspirin 81 MG chewable tablet Aspirin 81 MG Oral Tablet Chewable QTY: 30 tablet Days: 30 Refills: 0  Written: 01/23/21 Patient Instructions: once a day   benzonatate 100 MG capsule Commonly known as: Tessalon Perles Take 1 capsule (100 mg total) by mouth 3 (three) times daily as needed for cough.   Breztri Aerosphere 160-9-4.8 MCG/ACT Aero Generic drug: Budeson-Glycopyrrol-Formoterol Inhale 2 puffs into the lungs in the morning and at bedtime.    ciprofloxacin 500 MG tablet Commonly known as: Cipro Take 1 tablet (  500 mg total) by mouth 2 (two) times daily.   colestipol 1 g tablet Commonly known as: COLESTID Take 2 g by mouth 2 (two) times daily.   cyanocobalamin 500 MCG tablet Commonly known as: VITAMIN B12 Take 1 tablet (500 mcg total) by mouth daily.   dicyclomine 10 MG capsule Commonly known as: BENTYL TAKE 1 CAPSULE BY MOUTH EVERY 8 HOURS AS NEEDED FOR ABDOMINAL PAIN   diphenoxylate-atropine 2.5-0.025 MG tablet Commonly known as: Lomotil Take 1 tablet by mouth 4 (four) times daily as needed for diarrhea or loose stools.   doxycycline 100 MG tablet Commonly known as: VIBRA-TABS Take 1 tablet (100 mg total) by mouth 2 (two) times daily.   DULoxetine 60 MG capsule Commonly known as: CYMBALTA Take 60 mg by mouth 2 (two) times daily.   esomeprazole 40 MG capsule Commonly known as: NexIUM Take 1 capsule (40 mg total) by mouth 2 (two) times daily before a meal.   famotidine 20 MG tablet Commonly known as: PEPCID Take 1 tablet (20 mg total) by mouth 2 (two) times daily.   furosemide 40 MG tablet Commonly known as: LASIX TAKE 1 TABLET BY MOUTH EVERY DAY   gabapentin 600 MG tablet Commonly known as: NEURONTIN Take 600 mg by mouth 2 (two) times daily.   hydrOXYzine 25 MG tablet Commonly known as: ATARAX Take 50 mg by mouth every 8 (eight) hours as needed for itching.   Klor-Con M10 10 MEQ tablet Generic drug: potassium chloride Take 10 mEq by mouth daily.   levothyroxine 200 MCG tablet Commonly known as: SYNTHROID PLEASE SEE ATTACHED FOR DETAILED DIRECTIONS   liothyronine 5 MCG tablet Commonly known as: CYTOMEL Take 5 mcg by mouth daily.   lisinopril 5 MG tablet Commonly known as: ZESTRIL TAKE 1 TABLET BY MOUTH EVERY DAY   metFORMIN 500 MG tablet Commonly known as: GLUCOPHAGE Take 1 tablet (500 mg total) by mouth 3 (three) times daily.   methocarbamol 500 MG tablet Commonly known as:  ROBAXIN Take 1 tablet (500 mg total) by mouth 3 (three) times daily.   metoprolol tartrate 50 MG tablet Commonly known as: LOPRESSOR Take 50 mg by mouth 2 (two) times daily.   montelukast 10 MG tablet Commonly known as: SINGULAIR Take 10 mg by mouth daily.   mupirocin ointment 2 % Commonly known as: BACTROBAN Apply 1 Application topically 2 (two) times daily.   nitrofurantoin (macrocrystal-monohydrate) 100 MG capsule Commonly known as: MACROBID Take 1 capsule (100 mg total) by mouth 2 (two) times daily.   nortriptyline 10 MG capsule Commonly known as: PAMELOR Take 10 mg in the morning   nystatin powder Commonly known as: nystatin Apply 1 Application topically 3 (three) times daily.   ondansetron 4 MG tablet Commonly known as: ZOFRAN Take 4 mg by mouth every 8 (eight) hours as needed.   predniSONE 50 MG tablet Commonly known as: DELTASONE Take 1 tablet by mouth daily in AM with food x 5 days   Repatha SureClick 140 MG/ML Soaj Generic drug: Evolocumab Inject into the skin.   rosuvastatin 40 MG tablet Commonly known as: CRESTOR Take 40 mg by mouth daily.   Rybelsus 3 MG Tabs Generic drug: Semaglutide Take 1 tablet (3 mg total) by mouth daily.   spironolactone 25 MG tablet Commonly known as: ALDACTONE TAKE 1/2 (HALF) TABLET BY MOUTH ONCE DAILY.   Suboxone 8-2 MG Film Generic drug: Buprenorphine HCl-Naloxone HCl Dissolve 1 film under tongue twice daily   Vitamin D (Ergocalciferol) 1.25 MG (50000  UNIT) Caps capsule Commonly known as: DRISDOL 1 po q wed and 1 po q sun        Allergies:  Allergies  Allergen Reactions   Geodon [Ziprasidone Hydrochloride] Other (See Comments)    Numbness ,sob, headaches, blurred vision   Sulfacetamide Sodium Hives   Ziprasidone Anaphylaxis    Other reaction(s): Other (See Comments) Numbness ,sob, headaches, blurred vision  Other reaction(s): Other (See Comments), Unknown Numbness ,sob, headaches, blurred  vision Numbness ,sob, headaches, blurred vision  Other reaction(s): Other (See Comments), Unknown Numbness ,sob, headaches, blurred vision Numbness ,sob, headaches, blurred vision   Numbness ,sob, headaches, blurred vision Other reaction(s): Other (See Comments), Unknown Numbness ,sob, headaches, blurred vision Numbness ,sob, headaches, blurred vision Other reaction(s): Other (See Comments), Unknown Numbness ,sob, headaches, blurred vision Numbness ,sob, headaches, blurred vision   Ziprasidone Hcl Anaphylaxis and Other (See Comments)    Other reaction(s): Other (See Comments), Unknown Numbness ,sob, headaches, blurred vision Numbness ,sob, headaches, blurred vision   Ace Inhibitors Rash   Erythromycin Rash and Hives   Hydromorphone Rash   Lamictal [Lamotrigine] Rash    Other reaction(s): Unknown   Strawberry (Diagnostic) Rash   Sulfa Antibiotics Hives and Rash    Other reaction(s): Unknown Other reaction(s): Unknown Other reaction(s): Unknown    Family History: Family History  Adopted: Yes  Family history unknown: Yes    Social History:  reports that she quit smoking about 3 years ago. Her smoking use included cigarettes. She started smoking about 14 years ago. She has a 5.00 pack-year smoking history. She has been exposed to tobacco smoke. She has quit using smokeless tobacco. She reports that she does not currently use alcohol. She reports current drug use. Drug: Other-see comments.  ROS:                                        Physical Exam: There were no vitals taken for this visit.  Constitutional:  Alert and oriented, No acute distress. HEENT: [place] AT, moist mucus membranes.  Trachea midline, no masses.   Laboratory Data: Lab Results  Component Value Date   WBC 8.9 10/08/2022   HGB 11.4 10/08/2022   HCT 34.4 10/08/2022   MCV 87 10/08/2022   PLT 323 06/13/2022    Lab Results  Component Value Date   CREATININE 0.67 10/08/2022     No results found for: "PSA"  No results found for: "TESTOSTERONE"  Lab Results  Component Value Date   HGBA1C 6.0 (H) 10/08/2022    Urinalysis    Component Value Date/Time   COLORURINE YELLOW (A) 02/25/2022 1435   APPEARANCEUR Cloudy (A) 10/01/2022 0920   LABSPEC 1.029 02/25/2022 1435   PHURINE 5.0 02/25/2022 1435   GLUCOSEU Negative 10/01/2022 0920   HGBUR NEGATIVE 02/25/2022 1435   BILIRUBINUR Negative 12/07/2022 1028   BILIRUBINUR Negative 10/01/2022 0920   KETONESUR NEGATIVE 02/25/2022 1435   PROTEINUR Negative 12/07/2022 1028   PROTEINUR 1+ (A) 10/01/2022 0920   PROTEINUR 30 (A) 02/25/2022 1435   UROBILINOGEN 0.2 12/07/2022 1028   UROBILINOGEN 1.0 06/18/2011 1258   NITRITE Negative 12/07/2022 1028   NITRITE Negative 10/01/2022 0920   NITRITE NEGATIVE 02/25/2022 1435   LEUKOCYTESUR Moderate (2+) (A) 12/07/2022 1028   LEUKOCYTESUR 1+ (A) 10/01/2022 0920   LEUKOCYTESUR TRACE (A) 02/25/2022 1435    Pertinent Imaging:   Assessment & Plan: Patient may or may not have  a bladder infection.  If she does I will switch her prophylaxis and this was discussed.  Nocturia likely multifactorial due to overactive bladder and nocturnal diuresis and this was discussed.  Patient agreed that if the culture is positive I will treat it and then switch her prophylaxis.  Otherwise I will keep her on the daily Macrodantin which was renewed 30 x 11  We talked about replacing the battery versus replacing the entire device which is approximately 54 years old and she is willing to do either 1.  She understands advised to change the lead she may not get the same result.  The lead in a few years may have issues that would need subsequent changing.  I will speak to [employer] but were leaning towards just changing the battery.  Retained tip issue discussed.  Increased risk of infection discussed.  1. Mixed incontinence  - Urinalysis, Complete   No follow-ups on file.  Martina Sinner,  MD  [facility] [address] [place], [place] [address] [phone]

## 2022-12-17 NOTE — Progress Notes (Addendum)
   Established Patient Office Visit  Subjective   Patient ID: Alexis Christensen, female    DOB: 1971/02/27  Age: 52 y.o. MRN: [id number]  Chief Complaint  Patient presents with   Follow-up    Interstim, Discuss Battery Replacement    HPI I spoke to the [employer] representative and to clarify her lead was before August 2020 and therefore is not MRI compatible.  Her battery has 12 months of battery life and we will change the entire device.  The patient will be called with the final decision.  She was okay with either 1    ROS    Objective:     BP 110/74   Pulse 81   Ht 5\' 4"  (1.626 m)   Wt 104.3 kg   BMI 39.48 kg/m    Physical Exam   Results for orders placed or performed in visit on 12/17/22  Microscopic Examination   Urine  Result Value Ref Range   WBC, UA >30 (A) 0 - 5 /hpf   RBC, Urine 11-30 (A) 0 - 2 /hpf   Epithelial Cells (non renal) 0-10 0 - 10 /hpf   Mucus, UA Present (A) Not Estab.   Bacteria, UA Many (A) None seen/Few  Urinalysis, Complete  Result Value Ref Range   Specific Gravity, UA >1.030 (H) 1.005 - 1.030   pH, UA 5.5 5.0 - 7.5   Color, UA Yellow Yellow   Appearance Ur Hazy (A) Clear   Leukocytes,UA 1+ (A) Negative   Protein,UA Negative Negative/Trace   Glucose, UA Negative Negative   Ketones, UA Negative Negative   RBC, UA Trace (A) Negative   Bilirubin, UA Negative Negative   Urobilinogen, Ur 0.2 0.2 - 1.0 mg/dL   Nitrite, UA Negative Negative   Microscopic Examination See below:       The 10-year ASCVD risk score (Arnett DK, et al., 2019) is: 1.5%    Assessment & Plan:   Problem List Items Addressed This Visit   None Visit Diagnoses     Mixed incontinence    -  Primary   Relevant Orders   Urinalysis, Complete (Completed)   CULTURE, URINE COMPREHENSIVE   Chronic cystitis           No follow-ups on file.    Alexis Sinner, MD Alexis Christensen presents for an office/procedure visit. BP today is 142/85 . Greater than 140/90.  Provider  notified. Pt advised to follow up with PCP. Pt voiced understanding.

## 2022-12-19 LAB — CLOSTRIDIUM DIFFICILE BY PCR: Toxigenic C. Difficile by PCR: NEGATIVE

## 2022-12-20 ENCOUNTER — Encounter (INDEPENDENT_AMBULATORY_CARE_PROVIDER_SITE_OTHER): Payer: Self-pay | Admitting: Family Medicine

## 2022-12-20 ENCOUNTER — Other Ambulatory Visit (INDEPENDENT_AMBULATORY_CARE_PROVIDER_SITE_OTHER): Payer: Self-pay | Admitting: Family Medicine

## 2022-12-20 ENCOUNTER — Ambulatory Visit (INDEPENDENT_AMBULATORY_CARE_PROVIDER_SITE_OTHER): Payer: 59 | Admitting: Family Medicine

## 2022-12-20 ENCOUNTER — Telehealth: Payer: Self-pay

## 2022-12-20 VITALS — BP 124/81 | HR 57 | Temp 98.2°F | Ht 64.0 in | Wt 234.0 lb

## 2022-12-20 DIAGNOSIS — R7303 Prediabetes: Secondary | ICD-10-CM

## 2022-12-20 DIAGNOSIS — Z6841 Body Mass Index (BMI) 40.0 and over, adult: Secondary | ICD-10-CM

## 2022-12-20 DIAGNOSIS — E559 Vitamin D deficiency, unspecified: Secondary | ICD-10-CM

## 2022-12-20 DIAGNOSIS — Z6839 Body mass index (BMI) 39.0-39.9, adult: Secondary | ICD-10-CM

## 2022-12-20 LAB — CULTURE, URINE COMPREHENSIVE

## 2022-12-20 MED ORDER — VITAMIN D (ERGOCALCIFEROL) 1.25 MG (50000 UNIT) PO CAPS
ORAL_CAPSULE | ORAL | 0 refills | Status: DC
Start: 2022-12-20 — End: 2023-01-03

## 2022-12-20 MED ORDER — METFORMIN HCL 500 MG PO TABS: 500.0000 mg | ORAL_TABLET | Freq: Two times a day (BID) | ORAL | 0 refills | Status: DC

## 2022-12-20 MED ORDER — RYBELSUS 3 MG PO TABS: 3.0000 mg | ORAL_TABLET | Freq: Every day | ORAL | 0 refills | Status: DC

## 2022-12-20 NOTE — Progress Notes (Signed)
Notify patient C. difficile stool test is negative.  Great news.  Continue with plan.

## 2022-12-20 NOTE — Progress Notes (Signed)
Alexis Christensen, D.O.  ABFM, ABOM Specializing in Clinical Bariatric Medicine  Office located at: [address] [place], [place]  [address]     Assessment and Plan:   Medications Discontinued During This Encounter  Medication Reason   Vitamin D, Ergocalciferol, (DRISDOL) 1.25 MG (50000 UNIT) CAPS capsule Reorder   metFORMIN (GLUCOPHAGE) 500 MG tablet Reorder   Semaglutide (RYBELSUS) 3 MG TABS Reorder     Meds ordered this encounter  Medications   metFORMIN (GLUCOPHAGE) 500 MG tablet    Sig: Take 1 tablet (500 mg total) by mouth 2 (two) times daily with a meal.    Dispense:  60 tablet    Refill:  0    30 d supply;  ** OV for RF **   Do not send RF request   Semaglutide (RYBELSUS) 3 MG TABS    Sig: Take 1 tablet (3 mg total) by mouth daily.    Dispense:  30 tablet    Refill:  0   Vitamin D, Ergocalciferol, (DRISDOL) 1.25 MG (50000 UNIT) CAPS capsule    Sig: 1 po q wed and 1 po q sun    Dispense:  8 capsule    Refill:  0    30 d supply;  ** OV for RF **   Do not send RF request     Pre-diabetes Assessment: Condition is not optimized. Patient has not been taking Rybelsus 3 mg once daily and Metformin 500 mg BID for the past several weeks because she was lost to follow up.   Lab Results  Component Value Date   HGBA1C 6.0 (H) 10/08/2022   HGBA1C 6.0 06/13/2022   HGBA1C 5.9 02/01/2022   INSULIN 13.8 02/14/2022   INSULIN 6.4 08/01/2021   INSULIN 7.4 03/28/2021   Plan: Restart both prediabetic meds. Will refill Rybelsus and Metformin today.  Urania will continue to work on weight loss, exercise, via their meal plan we devised to help decrease the risk of progressing to diabetes. Will continue to monitor condition closely.    Vitamin D deficiency Assessment: Condition is poorly controlled. Patient has been taking OTC Vitamin D; She does not recall the exact dosage. She has not been taking Ergocalciferol 50K IU twice weekly because she was lost to follow up.   Lab  Results  Component Value Date   VD25OH 16.2 (L) 07/03/2022   VD25OH 19.6 (L) 02/14/2022   VD25OH 25.7 (L) 08/01/2021   Plan: Will reorder Ergocalciferol 50K IU today.  Will continue to monitor levels regularly (every 3-4 mo on average) to keep levels within normal limits and prevent over supplementation.   TREATMENT PLAN FOR OBESITY: Obesity,current BMI 37.9 Morbid obesity (HCC) Assessment:  Alexis Christensen is here to discuss her progress with her obesity treatment plan along with follow-up of her obesity related diagnoses. See Medical Weight Management Flowsheet for complete bioelectrical impedance results.  Condition is worsening Biometric data collected today, was reviewed with patient.   Since last office visit on 09/11/22 patient's  Muscle mass has increased by 1.4 lb. Fat mass has increased by 12.6 lb. Total body water has increased by 8.6 lb.  Counseling done on how various foods will affect these numbers and how to maximize success  Total lbs lost to date: + 4  Total weight loss percentage to date: 4.37   Plan: Continue the Category 1 meal plan with breakfast and lunch options.   Behavioral Intervention Additional resources provided today: category 1 meal plan information, breakfast  options, and lunch options, Healthy [employer] Evidence-based interventions for health behavior change were utilized today including the discussion of self monitoring techniques, problem-solving barriers and SMART goal setting techniques.   Regarding patient's less desirable eating habits and patterns, we employed the technique of small changes.  Pt will specifically work on: continue following meal plan closely and walking for stress management  for next visit.    Recommended Physical Activity Goals  Cleopha has been advised to slowly work up to 150 minutes of moderate intensity aerobic activity a week and strengthening exercises 2-3 times per week for cardiovascular health, weight loss  maintenance and preservation of muscle mass.   She has agreed to Continue current level of physical activity    FOLLOW UP: Return in about 4 weeks (around 01/17/2023). She was informed of the importance of frequent follow up visits to maximize her success with intensive lifestyle modifications for her multiple health conditions.  Subjective:   Chief complaint: Obesity Alexis Christensen is here to discuss her progress with her obesity treatment plan. She is on the the Category 1 Plan and states she is following her eating plan approximately 10 % of the time. She states she  not exercising.   Interval History:  Alexis Christensen is here for a follow up office visit.    Since last office visit with Dr.Bowen on 09/11/22:  - Patient endorses that life has been very stressful since the death of her fiance and other at home stressors.  - She has been doing emotional/convenience eating due to the stress.   - Has not been doing any formal exercise.   Review of Systems:  Pertinent positives were addressed with patient today.  Weight Summary and Biometrics   Weight Lost Since Last Visit: 0lb  Weight Gained Since Last Visit: 14lb    Vitals Temp: 98.2 F (36.8 C) BP: 124/81 Pulse Rate: (!) 57 SpO2: 99 %   Anthropometric Measurements Height: 5\' 4"  (1.626 m) Weight: 234 lb (106.1 kg) BMI (Calculated): 40.15 Weight at Last Visit: 220lb Weight Lost Since Last Visit: 0lb Weight Gained Since Last Visit: 14lb Starting Weight: 229lb Total Weight Loss (lbs): 0 lb (0 kg)   Body Composition  Body Fat %: 51.3 % Fat Mass (lbs): 120.4 lbs Muscle Mass (lbs): 108.6 lbs Total Body Water (lbs): 85.2 lbs Visceral Fat Rating : 16   Other Clinical Data Fasting: No Labs: No Today's Visit #: 22   Objective:   PHYSICAL EXAM: Blood pressure 124/81, pulse (!) 57, temperature 98.2 F (36.8 C), height 5\' 4"  (1.626 m), weight 234 lb (106.1 kg), SpO2 99 %. Body mass index is 40.17 kg/m.  General: Well  Developed, well nourished, and in no acute distress.  HEENT: Normocephalic, atraumatic Skin: Warm and dry, cap RF less 2 sec, good turgor Chest:  Normal excursion, shape, no gross abn Respiratory: speaking in full sentences, no conversational dyspnea NeuroM-Sk: Ambulates w/o assistance, moves * 4 Psych: A and O *3, insight good, mood-full  DIAGNOSTIC DATA REVIEWED:  BMET    Component Value Date/Time   NA 141 10/08/2022 1602   NA 138 11/07/2012 0517   K 4.2 10/08/2022 1602   K 3.9 11/07/2012 0517   CL 101 10/08/2022 1602   CL 109 (H) 11/07/2012 0517   CO2 27 10/08/2022 1602   CO2 25 11/07/2012 0517   GLUCOSE 94 10/08/2022 1602   GLUCOSE 91 02/25/2022 1435   GLUCOSE 91 11/07/2012 0517   BUN 12 10/08/2022 1602  BUN 7 11/07/2012 0517   CREATININE 0.67 10/08/2022 1602   CREATININE 0.76 11/07/2012 0517   CALCIUM 9.1 10/08/2022 1602   CALCIUM 7.9 (L) 11/07/2012 0517   GFRNONAA >60 02/25/2022 1435   GFRNONAA >60 11/07/2012 0517   GFRAA >60 02/17/2020 0000   GFRAA >60 11/07/2012 0517   Lab Results  Component Value Date   HGBA1C 6.0 (H) 10/08/2022   HGBA1C 6.0 03/06/2021   Lab Results  Component Value Date   INSULIN 13.8 02/14/2022   INSULIN 7.4 03/28/2021   Lab Results  Component Value Date   TSH 15.200 (H) 10/08/2022   CBC    Component Value Date/Time   WBC 8.9 10/08/2022 1602   WBC 6.2 02/25/2022 1435   RBC 3.95 10/08/2022 1602   RBC 3.90 06/13/2022 0000   HGB 11.4 10/08/2022 1602   HCT 34.4 10/08/2022 1602   PLT 323 06/13/2022 0000   PLT 209 11/07/2012 0517   MCV 87 10/08/2022 1602   MCV 89 10/20/2012 1448   MCH 28.9 10/08/2022 1602   MCH 28.5 02/25/2022 1435   MCHC 33.1 10/08/2022 1602   MCHC 32.0 02/25/2022 1435   RDW 12.8 10/08/2022 1602   RDW 13.0 10/20/2012 1448   Iron Studies    Component Value Date/Time   IRON 39 07/13/2016 1055   TIBC 408 07/13/2016 1055   FERRITIN 11 07/13/2016 1055   IRONPCTSAT 10 (L) 07/13/2016 1055   Lipid Panel      Component Value Date/Time   CHOL 196 10/08/2022 1602   TRIG 96 10/08/2022 1602   HDL 66 10/08/2022 1602   CHOLHDL 3.0 10/08/2022 1602   LDLCALC 113 (H) 10/08/2022 1602   Hepatic Function Panel     Component Value Date/Time   PROT 6.3 10/08/2022 1602   ALBUMIN 3.9 10/08/2022 1602   AST 20 10/08/2022 1602   ALT 23 10/08/2022 1602   ALKPHOS 118 10/08/2022 1602   BILITOT 0.3 10/08/2022 1602      Component Value Date/Time   TSH 15.200 (H) 10/08/2022 1602   Nutritional Lab Results  Component Value Date   VD25OH 16.2 (L) 07/03/2022   VD25OH 19.6 (L) 02/14/2022   VD25OH 25.7 (L) 08/01/2021    Attestations:   Reviewed by clinician on day of visit: allergies, medications, problem list, medical history, surgical history, family history, social history, and previous encounter notes.   I,Special Puri,acting as a Neurosurgeon for [employer], DO.,have documented all relevant documentation on the behalf of Thomasene Lot, DO,as directed by  Thomasene Lot, DO while in the presence of Thomasene Lot, DO.   I, Thomasene Lot, DO, have reviewed all documentation for this visit. The documentation on 12/20/22 for the exam, diagnosis, procedures, and orders are all accurate and complete.

## 2022-12-20 NOTE — Telephone Encounter (Signed)
Left detailed message on patient's VM.   Notify patient C. difficile stool test is negative.  Great news.  Continue with plan.

## 2022-12-23 DIAGNOSIS — Z885 Allergy status to narcotic agent status: Secondary | ICD-10-CM | POA: Diagnosis not present

## 2022-12-23 DIAGNOSIS — Z881 Allergy status to other antibiotic agents status: Secondary | ICD-10-CM | POA: Diagnosis not present

## 2022-12-23 DIAGNOSIS — E785 Hyperlipidemia, unspecified: Secondary | ICD-10-CM | POA: Diagnosis not present

## 2022-12-23 DIAGNOSIS — R0602 Shortness of breath: Secondary | ICD-10-CM | POA: Diagnosis not present

## 2022-12-23 DIAGNOSIS — Z87891 Personal history of nicotine dependence: Secondary | ICD-10-CM | POA: Diagnosis not present

## 2022-12-23 DIAGNOSIS — Z79899 Other long term (current) drug therapy: Secondary | ICD-10-CM | POA: Diagnosis not present

## 2022-12-23 DIAGNOSIS — R0600 Dyspnea, unspecified: Secondary | ICD-10-CM | POA: Diagnosis not present

## 2022-12-23 DIAGNOSIS — E039 Hypothyroidism, unspecified: Secondary | ICD-10-CM | POA: Diagnosis not present

## 2022-12-23 DIAGNOSIS — R051 Acute cough: Secondary | ICD-10-CM | POA: Diagnosis not present

## 2022-12-23 DIAGNOSIS — E876 Hypokalemia: Secondary | ICD-10-CM | POA: Diagnosis not present

## 2022-12-23 DIAGNOSIS — K219 Gastro-esophageal reflux disease without esophagitis: Secondary | ICD-10-CM | POA: Diagnosis not present

## 2022-12-23 DIAGNOSIS — Z882 Allergy status to sulfonamides status: Secondary | ICD-10-CM | POA: Diagnosis not present

## 2022-12-24 ENCOUNTER — Other Ambulatory Visit: Payer: Self-pay

## 2022-12-24 DIAGNOSIS — N3020 Other chronic cystitis without hematuria: Secondary | ICD-10-CM

## 2022-12-24 DIAGNOSIS — N3001 Acute cystitis with hematuria: Secondary | ICD-10-CM

## 2022-12-24 MED ORDER — CIPROFLOXACIN HCL 250 MG PO TABS
250.0000 mg | ORAL_TABLET | Freq: Two times a day (BID) | ORAL | 0 refills | Status: DC
Start: 2022-12-24 — End: 2023-03-02

## 2022-12-24 MED ORDER — CEPHALEXIN 250 MG PO CAPS
250.0000 mg | ORAL_CAPSULE | Freq: Every day | ORAL | 11 refills | Status: DC
Start: 2022-12-24 — End: 2023-02-11

## 2022-12-26 ENCOUNTER — Ambulatory Visit (INDEPENDENT_AMBULATORY_CARE_PROVIDER_SITE_OTHER): Payer: 59 | Admitting: Physician Assistant

## 2022-12-26 ENCOUNTER — Encounter: Payer: Self-pay | Admitting: Physician Assistant

## 2022-12-26 VITALS — BP 129/85 | HR 82 | Temp 98.3°F | Ht 64.0 in | Wt 235.0 lb

## 2022-12-26 DIAGNOSIS — R197 Diarrhea, unspecified: Secondary | ICD-10-CM | POA: Diagnosis not present

## 2022-12-26 DIAGNOSIS — K449 Diaphragmatic hernia without obstruction or gangrene: Secondary | ICD-10-CM | POA: Diagnosis not present

## 2022-12-26 DIAGNOSIS — D509 Iron deficiency anemia, unspecified: Secondary | ICD-10-CM | POA: Diagnosis not present

## 2022-12-26 DIAGNOSIS — K219 Gastro-esophageal reflux disease without esophagitis: Secondary | ICD-10-CM | POA: Diagnosis not present

## 2022-12-26 DIAGNOSIS — K529 Noninfective gastroenteritis and colitis, unspecified: Secondary | ICD-10-CM

## 2022-12-26 DIAGNOSIS — K580 Irritable bowel syndrome with diarrhea: Secondary | ICD-10-CM | POA: Diagnosis not present

## 2022-12-26 MED ORDER — COLESTIPOL HCL 1 G PO TABS
2.0000 g | ORAL_TABLET | Freq: Two times a day (BID) | ORAL | 5 refills | Status: DC
Start: 2022-12-26 — End: 2023-06-24

## 2022-12-26 MED ORDER — LOPERAMIDE HCL 2 MG PO TABS
4.0000 mg | ORAL_TABLET | Freq: Four times a day (QID) | ORAL | 2 refills | Status: AC
Start: 2022-12-26 — End: 2023-03-26

## 2022-12-26 NOTE — Progress Notes (Signed)
Alexis Amy, PA-C [address]  [address]  [place], [place] [address]  Main: [phone]  Fax: [phone]   Primary Care Physician: Margaretann Loveless, MD  Primary Gastroenterologist:  Alexis Amy, PA-C / Dr. Wyline Mood   Chief Complaint  Patient presents with   Follow-up   Gastroesophageal Reflux   Diarrhea   Dysphagia    HPI: Alexis Christensen is a 52 y.o. female returns for 1 month follow-up of dysphagia, GERD, chronic diarrhea, and irritable bowel syndrome.  1 month ago she was switched from pantoprazole to Nexium 40 Mg twice daily and famotidine 20 Mg twice daily for acid reflux.  She was continued on dicyclomine 10 Mg 4 times daily and Colestid 1 g 2 to 3 tablets daily for IBS-D.  Lower abdominal cramping has improved on dicyclomine.  However, she continues to have moderate acid reflux and diarrhea.  10 loose stools per day.  She has city water.  Barium swallow w/ tab 12/14/2022 showed moderate hiatal hernia, marked spontaneous GERD.  Normal esophageal motility.  No stricture.  Stool studies: She did not complete the GI pathogen panel that was ordered.  C. difficile PCR done 12/17/2022 was negative.  He is on frequent antibiotics for recurrent UTIs.  Followed by urologist.  Currently on Cipro for UTI now.  EGD done by Dr. Norma Fredrickson 06/2019, for GERD and nausea, showed a 2 cm hiatal hernia, and mild gastritis, otherwise normal.  Biopsies were negative for H. pylori, metaplasia, or dysplasia.  No evidence of Barrett's.   Colonoscopy 06/2019, done to evaluate diarrhea & bleeding, showed mild diverticulosis, 1 small 5 mm tubular adenoma polyp removed from ascending colon, and small internal hemorrhoids.  Biopsies were negative for microscopic colitis.  No evidence of IBD.   Last abdominal pelvic CT 07/2020 showed no acute abnormality.     Labs 11/06/2022 showed normal CBC (hemoglobin 12.6, WBC 5.6), and normal CMP.  Elevated TSH 11.29.  Labs 12/23/2022 showed normal CBC and CMP.   Negative RSV, influenza, and COVID.  Normal magnesium.  Slightly low potassium 3.3.  Normal BUN and creatinine.  Normal chest x-ray.   Current Outpatient Medications  Medication Sig Dispense Refill   albuterol (VENTOLIN HFA) 108 (90 Base) MCG/ACT inhaler Inhale 1 puff into the lungs every 6 (six) hours as needed for wheezing or shortness of breath.     ARIPiprazole (ABILIFY) 30 MG tablet Take 30 mg by mouth daily.     aspirin 81 MG chewable tablet Aspirin 81 MG Oral Tablet Chewable QTY: 30 tablet Days: 30 Refills: 0  Written: 01/23/21 Patient Instructions: once a day     Budeson-Glycopyrrol-Formoterol (BREZTRI AEROSPHERE) 160-9-4.8 MCG/ACT AERO Inhale 2 puffs into the lungs in the morning and at bedtime.     cephALEXin (KEFLEX) 250 MG capsule Take 1 capsule (250 mg total) by mouth daily. 30 capsule 11   ciprofloxacin (CIPRO) 250 MG tablet Take 1 tablet (250 mg total) by mouth 2 (two) times daily. 14 tablet 0   colestipol (COLESTID) 1 g tablet Take 2 g by mouth 2 (two) times daily.     cyanocobalamin (VITAMIN B12) 500 MCG tablet Take 1 tablet (500 mcg total) by mouth daily. 90 tablet 0   dicyclomine (BENTYL) 10 MG capsule TAKE 1 CAPSULE BY MOUTH EVERY 8 HOURS AS NEEDED FOR ABDOMINAL PAIN 270 capsule 3   DULoxetine (CYMBALTA) 60 MG capsule Take 60 mg by mouth 2 (two) times daily.     esomeprazole (NEXIUM) 40 MG  capsule Take 1 capsule (40 mg total) by mouth 2 (two) times daily before a meal. 180 capsule 1   Evolocumab (REPATHA SURECLICK) 140 MG/ML SOAJ Inject into the skin.     famotidine (PEPCID) 20 MG tablet Take 1 tablet (20 mg total) by mouth 2 (two) times daily. 180 tablet 1   furosemide (LASIX) 40 MG tablet TAKE 1 TABLET BY MOUTH EVERY DAY 90 tablet 0   gabapentin (NEURONTIN) 600 MG tablet Take 600 mg by mouth 2 (two) times daily.      hydrOXYzine (ATARAX/VISTARIL) 25 MG tablet Take 50 mg by mouth every 8 (eight) hours as needed for itching.     KLOR-CON M10 10 MEQ tablet Take 10 mEq by mouth  daily.     levothyroxine (SYNTHROID) 200 MCG tablet PLEASE SEE ATTACHED FOR DETAILED DIRECTIONS     liothyronine (CYTOMEL) 5 MCG tablet Take 5 mcg by mouth daily.     lisinopril (ZESTRIL) 5 MG tablet TAKE 1 TABLET BY MOUTH EVERY DAY 90 tablet 1   meloxicam (MOBIC) 15 MG tablet Take by mouth.     metFORMIN (GLUCOPHAGE) 500 MG tablet Take 1 tablet (500 mg total) by mouth 2 (two) times daily with a meal. 60 tablet 0   methocarbamol (ROBAXIN) 500 MG tablet Take 1 tablet (500 mg total) by mouth 3 (three) times daily. 30 tablet 0   metoprolol tartrate (LOPRESSOR) 50 MG tablet Take 50 mg by mouth 2 (two) times daily.     montelukast (SINGULAIR) 10 MG tablet Take 10 mg by mouth daily.     mupirocin ointment (BACTROBAN) 2 % Apply 1 Application topically 2 (two) times daily. 22 g 0   nortriptyline (PAMELOR) 10 MG capsule Take 10 mg in the morning     nystatin powder Apply 1 Application topically 3 (three) times daily. 15 g 3   rosuvastatin (CRESTOR) 40 MG tablet Take 40 mg by mouth daily.     Semaglutide (RYBELSUS) 3 MG TABS Take 1 tablet (3 mg total) by mouth daily. 30 tablet 0   spironolactone (ALDACTONE) 25 MG tablet TAKE 1/2 (HALF) TABLET BY MOUTH ONCE DAILY. 45 tablet 1   SUBOXONE 8-2 MG FILM Dissolve 1 film under tongue twice daily  0   Vitamin D, Ergocalciferol, (DRISDOL) 1.25 MG (50000 UNIT) CAPS capsule 1 po q wed and 1 po q sun 8 capsule 0   No current facility-administered medications for this visit.    Allergies as of 12/26/2022 - Review Complete 12/26/2022  Allergen Reaction Noted   Geodon [ziprasidone hydrochloride] Other (See Comments) 06/14/2011   Sulfacetamide sodium Hives 05/26/2014   Ziprasidone Anaphylaxis 06/14/2011   Ziprasidone hcl Anaphylaxis and Other (See Comments) 01/04/2014   Ace inhibitors Rash 05/10/2015   Erythromycin Rash and Hives 03/27/2015   Hydromorphone Rash 05/10/2015   Lamictal [lamotrigine] Rash 01/04/2014   Strawberry (diagnostic) Rash 10/13/2015   Sulfa  antibiotics Hives and Rash 06/14/2011    Past Medical History:  Diagnosis Date   Acute diastolic (congestive) heart failure (HCC)    ADHD    Adopted    Anemia    Anxiety    Arthritis    Asthma    B12 deficiency    Bilateral swelling of feet and ankles    Bipolar 1 disorder (HCC)    Bipolar disorder (HCC)    Carpal tunnel syndrome 09/15/2014   Chest pain    CHF (congestive heart failure) (HCC)    Dialostic CHF   Chronic diarrhea 09/07/2015  Chronic kidney disease    H/O KIDNEY STONES   Chronic venous insufficiency 04/26/2016   Depression    DVT (deep venous thrombosis) (HCC) 2016   RIGHT LEG   Edema leg    Effusion of knee 12/30/2013   Family history of adverse reaction to anesthesia    ADOPTED   Food allergy    GERD (gastroesophageal reflux disease)    Goiter    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   Headache(784.0)    MIGRAINES   Heart burn    Heart murmur    History of blood clots    History of kidney stones    HLD (hyperlipidemia)    Hx MRSA infection    Hyperlipidemia    Hypothyroidism    Lipoma of arm 05/11/2013   Lower extremity edema    Lymphedema 04/26/2016   Other fatigue    Post traumatic stress disorder (PTSD)    Prediabetes    PVD (peripheral vascular disease) (HCC)    Seasonal allergies    Shortness of breath    Shortness of breath on exertion    Stress incontinence    Thyroid disease    Urge incontinence    Vitamin D deficiency     Past Surgical History:  Procedure Laterality Date   ABDOMINAL HYSTERECTOMY     ANKLE SURGERY     CHOLECYSTECTOMY     COLONOSCOPY WITH PROPOFOL N/A 06/24/2019   Procedure: COLONOSCOPY WITH PROPOFOL;  Surgeon: Toledo, Boykin Nearing, MD;  Location: [facility];  Service: Gastroenterology;  Laterality: N/A;   CYSTOSCOPY N/A 04/09/2016   Procedure: CYSTOSCOPY;  Surgeon: Alfredo Martinez, MD;  Location: [facility];  Service: Urology;  Laterality: N/A;   DILATION AND CURETTAGE OF UTERUS      ESOPHAGOGASTRODUODENOSCOPY (EGD) WITH PROPOFOL N/A 06/24/2019   Procedure: ESOPHAGOGASTRODUODENOSCOPY (EGD) WITH PROPOFOL;  Surgeon: Toledo, Boykin Nearing, MD;  Location: [facility];  Service: Gastroenterology;  Laterality: N/A;   INGUINAL HERNIA REPAIR Bilateral 04/18/2017   Procedure: LAPAROSCOPIC BILATERAL INGUINAL HERNIA REPAIR;  Surgeon: Leafy Ro, MD;  Location: [facility];  Service: General;  Laterality: Bilateral;   INTERSTIM IMPLANT PLACEMENT N/A 11/26/2016   Procedure: Leane Platt IMPLANT FIRST STAGE;  Surgeon: Alfredo Martinez, MD;  Location: [facility];  Service: Urology;  Laterality: N/A;   INTERSTIM IMPLANT PLACEMENT N/A 11/26/2016   Procedure: Leane Platt IMPLANT SECOND STAGE;  Surgeon: Alfredo Martinez, MD;  Location: [facility];  Service: Urology;  Laterality: N/A;   JOINT REPLACEMENT Right    knee   KNEE ARTHROSCOPY Bilateral    KNEE ARTHROSCOPY WITH LATERAL RELEASE Left 10/18/2015   Procedure: KNEE ARTHROSCOPY LATERAL AND PARTIAL SYNOVECTOMY;  Surgeon: Kennedy Bucker, MD;  Location: [facility];  Service: Orthopedics;  Laterality: Left;   Lymph Node removal  2015   Neck   PUBOVAGINAL SLING N/A 04/09/2016   Procedure: PUBO-VAGINAL SLING/ RETROPUBIC SLING;  Surgeon: Alfredo Martinez, MD;  Location: [facility];  Service: Urology;  Laterality: N/A;   REPLACEMENT TOTAL KNEE Right    SHOULDER SURGERY Right 2014   TUBAL LIGATION     WRIST SURGERY Right    metal plate    Review of Systems:    All systems reviewed and negative except where noted in HPI.   Physical Examination:   BP 129/85 (BP Location: Right Arm, Patient Position: Sitting, Cuff Size: Large)   Pulse 82   Temp 98.3 F (36.8 C) (Oral)   Wt 235 lb (106.6 kg)   BMI 40.34 kg/m   General: Well-nourished, well-developed  in no acute distress.  Eyes: No icterus. Conjunctivae pink. Mouth: Oropharyngeal mucosa moist and pink , no lesions erythema or exudate. Lungs: Clear to auscultation bilaterally. Non-labored. Heart: Regular  rate and rhythm, no murmurs rubs or gallops.  Abdomen: Bowel sounds are normal; Abdomen is Soft; No hepatosplenomegaly, masses or hernias;  No Abdominal Tenderness; No guarding or rebound tenderness. Extremities: No lower extremity edema. No clubbing or deformities. Neuro: Alert and oriented x 3.  Grossly intact. Skin: Warm and dry, no jaundice.   Psych: Alert and cooperative, normal mood and affect.   Imaging Studies: DG ESOPHAGUS W SINGLE CM (SOL OR THIN BA)  Result Date: 12/14/2022 CLINICAL DATA:  52 year old female with history GERD, IBS and hiatal hernia referred for fluoroscopic esophagram study due to complaint of increasing reflux and solid-food dysphagia. EXAM: ESOPHAGUS/BARIUM SWALLOW/TABLET STUDY TECHNIQUE: Combined double and single contrast examination was performed using effervescent crystals, high-density barium, and thin liquid barium. This exam was performed by Alex Gardener, NP, and was supervised and interpreted by Cleone Slim, MD. FLUOROSCOPY: Radiation Exposure Index (as provided by the fluoroscopic device): 53.50 mGy Kerma COMPARISON:  None Available. FINDINGS: Swallowing: Appears normal. No vestibular penetration or tracheal aspiration seen. Pharynx: Unremarkable. Esophagus: Normal contour and caliber with no mass or stricture noted. Esophageal motility: Within normal limits. Hiatal Hernia: Moderate hiatal hernia. Gastroesophageal reflux: Large volume spontaneous gastroesophageal reflux to the level of the thoracic inlet. Ingested 13mm barium tablet: 13 mm barium tablet passed normally to the stomach. Other: None. IMPRESSION: 1. Moderate hiatal hernia. Marked spontaneous gastroesophageal reflux observed. 2. Esophageal motility within normal limits. No evidence of esophageal stricture. Electronically Signed   By: Delbert Phenix M.D.   On: 12/14/2022 11:57    Assessment and Plan:   Harumi [name] ALARID is a 52 y.o. y/o female returns for 1 month follow-up.  Recent barium swallow showed  moderate hiatal hernia and moderate GERD.  No stricture or dysmotility.  She continues to have moderate GERD and spite of high-dose PPI and H2 RB.  She continues to have moderate diarrhea.  Recent C. difficile PCR was negative.  She did not complete the GI pathogen panel.  Previous colonoscopy showed biopsies negative for microscopic colitis and IBD.  She has history of IBS.  Pancreatic insufficiency is also in the differential.  GERD - Moderate GERD not controlled on high-dose PPI and H2 RB. Continue Nexium 40 mg twice daily and Pepcid 20 Mg twice daily. Aggressive lifestyle modification. Rec. Avoid coffee, sodas, peppermint, citrus fruits, and spicey foods.   Avoid eating 2-3 hours before bedtime.   Moderate Hiatal Hernia  Refer to surgeon Dr. Everlene Farrier to evaluate for Funoplication Hiatal Hernia repair.  Chronic Diarrhea - Recent C. Diff PCR Negative. Stool Studies: GI pathogen panel, fecal pancreatic elastase.  IBS-D  Continue dicyclomine 10 Mg 3 times daily as needed abdominal cramping.  Continue Colestid 1 g take 2 tablets twice daily.  Start prescription loperamide 2 mg 2 tablets 4 times daily to help control diarrhea.  Recurrent UTI's; On frequent Abx  Follow-up with urologist.   Alexis Amy, PA-C  Follow up in 3 months.

## 2022-12-26 NOTE — Patient Instructions (Addendum)
Take Loperamide (IMODIUM A-D) 2 MG tablet 2 tablets 4 times daily as needed for diarrhea  Continue Colestipol 2 tablets 2 times daily  Continue Pepcid and Nexium daily

## 2022-12-31 ENCOUNTER — Ambulatory Visit: Payer: 59

## 2022-12-31 ENCOUNTER — Encounter: Payer: Self-pay | Admitting: Surgery

## 2022-12-31 ENCOUNTER — Ambulatory Visit (INDEPENDENT_AMBULATORY_CARE_PROVIDER_SITE_OTHER): Payer: 59 | Admitting: Surgery

## 2022-12-31 VITALS — BP 118/80 | HR 71 | Temp 98.4°F | Ht 64.0 in | Wt 236.2 lb

## 2022-12-31 DIAGNOSIS — K449 Diaphragmatic hernia without obstruction or gangrene: Secondary | ICD-10-CM

## 2022-12-31 DIAGNOSIS — R1310 Dysphagia, unspecified: Secondary | ICD-10-CM | POA: Diagnosis not present

## 2022-12-31 DIAGNOSIS — K219 Gastro-esophageal reflux disease without esophagitis: Secondary | ICD-10-CM | POA: Diagnosis not present

## 2022-12-31 NOTE — Progress Notes (Signed)
Patient ID: Alexis Christensen, female   DOB: September 09, 1970, 52 y.o.   MRN: [id number]  HPI Alexis Christensen is a 52 y.o. female in consultation at the request of Ms. Miki Kins.  Endorses dysphagia for the last 2 months.  Sometimes the seems to be related to solid and sometimes seems to be related to liquids.  She also endorses regards to her heartburn and reflux for the last several months that only partially relieved with medications.  Does have chronic GI issues to include irritable bowel syndrome and diarrhea. She does have bipolar disorder and is on medication for that.  She does have also diastolic heart failure but is very functional and is able to perform more than 4 METS of activity without shortness of breath or chest pain. Did have a recent barium swallow that I have personally reviewed showing evidence of a moderate hiatal hernia with reflux.  No evidence of esophageal motility disorder. CBC and BMP was completely normal SHe stated that she quit smoking 4 years ago. HPI  Past Medical History:  Diagnosis Date   Acute diastolic (congestive) heart failure (HCC)    ADHD    Adopted    Anemia    Anxiety    Arthritis    Asthma    B12 deficiency    Bilateral swelling of feet and ankles    Bipolar 1 disorder (HCC)    Bipolar disorder (HCC)    Carpal tunnel syndrome 09/15/2014   Chest pain    CHF (congestive heart failure) (HCC)    Dialostic CHF   Chronic diarrhea 09/07/2015   Chronic kidney disease    H/O KIDNEY STONES   Chronic venous insufficiency 04/26/2016   Depression    DVT (deep venous thrombosis) (HCC) 2016   RIGHT LEG   Edema leg    Effusion of knee 12/30/2013   Family history of adverse reaction to anesthesia    ADOPTED   Food allergy    GERD (gastroesophageal reflux disease)    Goiter    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   Headache(784.0)    MIGRAINES   Heart burn    Heart murmur    History of blood clots    History of kidney stones    HLD (hyperlipidemia)     Hx MRSA infection    Hyperlipidemia    Hypothyroidism    Lipoma of arm 05/11/2013   Lower extremity edema    Lymphedema 04/26/2016   Other fatigue    Post traumatic stress disorder (PTSD)    Prediabetes    PVD (peripheral vascular disease) (HCC)    Seasonal allergies    Shortness of breath    Shortness of breath on exertion    Stress incontinence    Thyroid disease    Urge incontinence    Vitamin D deficiency     Past Surgical History:  Procedure Laterality Date   ABDOMINAL HYSTERECTOMY     ANKLE SURGERY     CHOLECYSTECTOMY     COLONOSCOPY WITH PROPOFOL N/A 06/24/2019   Procedure: COLONOSCOPY WITH PROPOFOL;  Surgeon: Toledo, Boykin Nearing, MD;  Location: [facility];  Service: Gastroenterology;  Laterality: N/A;   CYSTOSCOPY N/A 04/09/2016   Procedure: CYSTOSCOPY;  Surgeon: Alfredo Martinez, MD;  Location: [facility];  Service: Urology;  Laterality: N/A;   DILATION AND CURETTAGE OF UTERUS     ESOPHAGOGASTRODUODENOSCOPY (EGD) WITH PROPOFOL N/A 06/24/2019   Procedure: ESOPHAGOGASTRODUODENOSCOPY (EGD) WITH PROPOFOL;  Surgeon: Toledo, Boykin Nearing, MD;  Location: [facility];  Service: Gastroenterology;  Laterality: N/A;   INGUINAL HERNIA REPAIR Bilateral 04/18/2017   Procedure: LAPAROSCOPIC BILATERAL INGUINAL HERNIA REPAIR;  Surgeon: Leafy Ro, MD;  Location: [facility];  Service: General;  Laterality: Bilateral;   INTERSTIM IMPLANT PLACEMENT N/A 11/26/2016   Procedure: Leane Platt IMPLANT FIRST STAGE;  Surgeon: Alfredo Martinez, MD;  Location: [facility];  Service: Urology;  Laterality: N/A;   INTERSTIM IMPLANT PLACEMENT N/A 11/26/2016   Procedure: Leane Platt IMPLANT SECOND STAGE;  Surgeon: Alfredo Martinez, MD;  Location: [facility];  Service: Urology;  Laterality: N/A;   JOINT REPLACEMENT Right    knee   KNEE ARTHROSCOPY Bilateral    KNEE ARTHROSCOPY WITH LATERAL RELEASE Left 10/18/2015   Procedure: KNEE ARTHROSCOPY LATERAL AND PARTIAL SYNOVECTOMY;  Surgeon: Kennedy Bucker, MD;  Location:  [facility];  Service: Orthopedics;  Laterality: Left;   Lymph Node removal  2015   Neck   PUBOVAGINAL SLING N/A 04/09/2016   Procedure: PUBO-VAGINAL SLING/ RETROPUBIC SLING;  Surgeon: Alfredo Martinez, MD;  Location: [facility];  Service: Urology;  Laterality: N/A;   REPLACEMENT TOTAL KNEE Right    SHOULDER SURGERY Right 2014   TUBAL LIGATION     WRIST SURGERY Right    metal plate    Family History  Adopted: Yes  Family history unknown: Yes    Social History Social History   Tobacco Use   Smoking status: Former    Packs/day: 0.50    Years: 10.00    Additional pack years: 0.00    Total pack years: 5.00    Types: Cigarettes    Start date: 07/16/2008    Quit date: 07/16/2019    Years since quitting: 3.4    Passive exposure: Past   Smokeless tobacco: Former   Tobacco comments:    STRESS RELATED  Vaping Use   Vaping Use: Never used  Substance Use Topics   Alcohol use: Not Currently    Comment: Hisotry of ETOH abouse [facility] patient   Drug use: Yes    Types: Other-see comments    Comment: Pt listed history of abuse of "prescription drugs"    Allergies  Allergen Reactions   Geodon [Ziprasidone Hydrochloride] Other (See Comments)    Numbness ,sob, headaches, blurred vision   Sulfacetamide Sodium Hives   Ziprasidone Anaphylaxis    Other reaction(s): Other (See Comments) Numbness ,sob, headaches, blurred vision  Other reaction(s): Other (See Comments), Unknown Numbness ,sob, headaches, blurred vision Numbness ,sob, headaches, blurred vision  Other reaction(s): Other (See Comments), Unknown Numbness ,sob, headaches, blurred vision Numbness ,sob, headaches, blurred vision   Numbness ,sob, headaches, blurred vision Other reaction(s): Other (See Comments), Unknown Numbness ,sob, headaches, blurred vision Numbness ,sob, headaches, blurred vision Other reaction(s): Other (See Comments), Unknown Numbness ,sob, headaches, blurred vision Numbness ,sob, headaches, blurred vision    Ziprasidone Hcl Anaphylaxis and Other (See Comments)    Other reaction(s): Other (See Comments), Unknown Numbness ,sob, headaches, blurred vision Numbness ,sob, headaches, blurred vision   Ace Inhibitors Rash   Erythromycin Rash and Hives   Hydromorphone Rash   Lamictal [Lamotrigine] Rash    Other reaction(s): Unknown   Strawberry (Diagnostic) Rash   Sulfa Antibiotics Hives and Rash    Other reaction(s): Unknown Other reaction(s): Unknown Other reaction(s): Unknown    Current Outpatient Medications  Medication Sig Dispense Refill   albuterol (VENTOLIN HFA) 108 (90 Base) MCG/ACT inhaler Inhale 1 puff into the lungs every 6 (six) hours as needed for wheezing or shortness of breath.     ARIPiprazole (  ABILIFY) 30 MG tablet Take 30 mg by mouth daily.     aspirin 81 MG chewable tablet Aspirin 81 MG Oral Tablet Chewable QTY: 30 tablet Days: 30 Refills: 0  Written: 01/23/21 Patient Instructions: once a day     Budeson-Glycopyrrol-Formoterol (BREZTRI AEROSPHERE) 160-9-4.8 MCG/ACT AERO Inhale 2 puffs into the lungs in the morning and at bedtime.     cephALEXin (KEFLEX) 250 MG capsule Take 1 capsule (250 mg total) by mouth daily. 30 capsule 11   ciprofloxacin (CIPRO) 250 MG tablet Take 1 tablet (250 mg total) by mouth 2 (two) times daily. 14 tablet 0   colestipol (COLESTID) 1 g tablet Take 2 tablets (2 g total) by mouth 2 (two) times daily. 120 tablet 5   cyanocobalamin (VITAMIN B12) 500 MCG tablet Take 1 tablet (500 mcg total) by mouth daily. 90 tablet 0   dicyclomine (BENTYL) 10 MG capsule TAKE 1 CAPSULE BY MOUTH EVERY 8 HOURS AS NEEDED FOR ABDOMINAL PAIN 270 capsule 3   DULoxetine (CYMBALTA) 60 MG capsule Take 60 mg by mouth 2 (two) times daily.     esomeprazole (NEXIUM) 40 MG capsule Take 1 capsule (40 mg total) by mouth 2 (two) times daily before a meal. 180 capsule 1   Evolocumab (REPATHA SURECLICK) 140 MG/ML SOAJ Inject into the skin.     famotidine (PEPCID) 20 MG tablet Take 1 tablet  (20 mg total) by mouth 2 (two) times daily. 180 tablet 1   furosemide (LASIX) 40 MG tablet TAKE 1 TABLET BY MOUTH EVERY DAY 90 tablet 0   gabapentin (NEURONTIN) 600 MG tablet Take 600 mg by mouth 2 (two) times daily.      hydrOXYzine (ATARAX/VISTARIL) 25 MG tablet Take 50 mg by mouth every 8 (eight) hours as needed for itching.     KLOR-CON M10 10 MEQ tablet Take 10 mEq by mouth daily.     levothyroxine (SYNTHROID) 200 MCG tablet PLEASE SEE ATTACHED FOR DETAILED DIRECTIONS     liothyronine (CYTOMEL) 5 MCG tablet Take 5 mcg by mouth daily.     lisinopril (ZESTRIL) 5 MG tablet TAKE 1 TABLET BY MOUTH EVERY DAY 90 tablet 1   loperamide (IMODIUM A-D) 2 MG tablet Take 2 tablets (4 mg total) by mouth in the morning, at noon, in the evening, and at bedtime. 240 tablet 2   meloxicam (MOBIC) 15 MG tablet Take by mouth.     metFORMIN (GLUCOPHAGE) 500 MG tablet Take 1 tablet (500 mg total) by mouth 2 (two) times daily with a meal. 60 tablet 0   methocarbamol (ROBAXIN) 500 MG tablet Take 1 tablet (500 mg total) by mouth 3 (three) times daily. 30 tablet 0   metoprolol tartrate (LOPRESSOR) 50 MG tablet Take 50 mg by mouth 2 (two) times daily.     montelukast (SINGULAIR) 10 MG tablet Take 10 mg by mouth daily.     mupirocin ointment (BACTROBAN) 2 % Apply 1 Application topically 2 (two) times daily. 22 g 0   nortriptyline (PAMELOR) 10 MG capsule Take 10 mg in the morning     nystatin powder Apply 1 Application topically 3 (three) times daily. 15 g 3   rosuvastatin (CRESTOR) 40 MG tablet Take 40 mg by mouth daily.     Semaglutide (RYBELSUS) 3 MG TABS Take 1 tablet (3 mg total) by mouth daily. 30 tablet 0   spironolactone (ALDACTONE) 25 MG tablet TAKE 1/2 (HALF) TABLET BY MOUTH ONCE DAILY. 45 tablet 1   SUBOXONE 8-2 MG FILM Dissolve  1 film under tongue twice daily  0   Vitamin D, Ergocalciferol, (DRISDOL) 1.25 MG (50000 UNIT) CAPS capsule 1 po q wed and 1 po q sun 8 capsule 0   No current facility-administered  medications for this visit.     Review of Systems Full ROS  was asked and was negative except for the information on the HPI  Physical Exam Blood pressure 118/80, pulse 71, temperature 98.4 F (36.9 C), temperature source Oral, height 5\' 4"  (1.626 m), weight 236 lb 3.2 oz (107.1 kg), SpO2 95 %. CONSTITUTIONAL: NAD. EYES: Pupils are equal, round, Sclera are non-icteric. EARS, NOSE, MOUTH AND THROAT:  The oral mucosa is pink and moist. Hearing is intact to voice. LYMPH NODES:  Lymph nodes in the neck are normal. RESPIRATORY:  Lungs are clear. There is normal respiratory effort, with equal breath sounds bilaterally, and without pathologic use of accessory muscles. CARDIOVASCULAR: Heart is regular without murmurs, gallops, or rubs. GI: The abdomen is  soft, mild tenderness to palpation in the epigastric area.  No evidence of guarding or peritonitis, There are no palpable masses. There is no hepatosplenomegaly. There are normal bowel sounds . GU: Rectal deferred.   MUSCULOSKELETAL: Normal muscle strength and tone. No cyanosis or edema.   SKIN: Turgor is good and there are no pathologic skin lesions or ulcers. NEUROLOGIC: Motor and sensation is grossly normal. Cranial nerves are grossly intact. PSYCH:  Oriented to person, place and time. Affect is normal.  Data Reviewed  I have personally reviewed the patient's imaging, laboratory findings and medical records.    Assessment/Plan 52 year old female with a moderate size hiatal hernia that is symptomatic and is causing reflux and dysphagia.  She does have a BMI of 40 which makes her on eligible for a paraesophageal hernia repair.  Discussed with her in detail about improvement in weight.  I do think that at some point in time she will benefit from robotic paraesophageal hernia repair and a partial fundoplication.  Were not quite ready yet.  She wishes to pursue further workup and she will need an EGD as well as a CT scan of the abdomen pelvis to  define intra-abdominal anatomy.  Discussed with her in detail regarding robotic surgery and robotic repair of paraesophageal hernia what it will entail and diet restrictions during the first month of the operation.  She seems to be in agreement.  Please note that I spent 60 minutes in this encounter including personally reviewing imaging studies, coordinating her care, placing orders and performing appropriate documentation  Sterling Big, MD FACS General Surgeon 12/31/2022, 11:24 AM

## 2022-12-31 NOTE — Patient Instructions (Addendum)
Centralized scheduling: [phone].  Your CT is scheduled for 01/03/2023 4 pm (arrive by 3:45 pm) at [facility]. Nothing to eat or drink 4 hours prior.   Your BMI should be around 35.  If you have any concerns or questions, please feel free to call our office. See follow up appointment.  Hiatal Hernia  A hiatal hernia occurs when part of the stomach slides above the muscle that separates the abdomen from the chest (diaphragm). A person can be born with a hiatal hernia (congenital), or it may develop over time. In almost all cases of hiatal hernia, only the top part of the stomach pushes through the diaphragm. Many people have a hiatal hernia with no symptoms. The larger the hernia, the more likely it is that you will have symptoms. In some cases, a hiatal hernia allows stomach acid to flow back into the tube that carries food from your mouth to your stomach (esophagus). This may cause heartburn symptoms. The development of heartburn symptoms may mean that you have a condition called gastroesophageal reflux disease (GERD). What are the causes? This condition is caused by a weakness in the opening (hiatus) where the esophagus passes through the diaphragm to attach to the upper part of the stomach. A person may be born with a weakness in the hiatus, or a weakness can develop over time. What increases the risk? This condition is more likely to develop in: Older people. Age is a major risk factor for a hiatal hernia, especially if you are over the age of 75. Pregnant women. People who are overweight. People who have frequent constipation. What are the signs or symptoms? Symptoms of this condition usually develop in the form of GERD symptoms. Symptoms include: Heartburn. Upset stomach (indigestion). Trouble swallowing. Coughing or wheezing. Wheezing is making high-pitched whistling sounds when you breathe. Sore throat. Chest pain. Nausea and vomiting. How is this diagnosed? This  condition may be diagnosed during testing for GERD. Tests that may be done include: X-rays of your stomach or chest. An upper gastrointestinal (GI) series. This is an X-ray exam of your GI tract that is taken after you swallow a chalky liquid that shows up clearly on the X-ray. Endoscopy. This is a procedure to look into your stomach using a thin, flexible tube that has a tiny camera and light on the end of it. How is this treated? This condition may be treated by: Dietary and lifestyle changes to help reduce GERD symptoms. Medicines. These may include: Over-the-counter antacids. Medicines that make your stomach empty more quickly. Medicines that block the production of stomach acid (H2 blockers). Stronger medicines to reduce stomach acid (proton pump inhibitors). Surgery to repair the hernia, if other treatments are not helping. If you have no symptoms, you may not need treatment. Follow these instructions at home: Lifestyle and activity Do not use any products that contain nicotine or tobacco. These products include cigarettes, chewing tobacco, and vaping devices, such as e-cigarettes. If you need help quitting, ask your health care provider. Try to achieve and maintain a healthy body weight. Avoid putting pressure on your abdomen. Anything that puts pressure on your abdomen increases the amount of acid that may be pushed up into your esophagus. Avoid bending over, especially after eating. Raise the head of your bed by putting blocks under the legs. This keeps your head and esophagus higher than your stomach. Do not wear tight clothing around your chest or stomach. Try not to strain when having a bowel movement,  when urinating, or when lifting heavy objects. Eating and drinking Avoid foods that can worsen GERD symptoms. These may include: Fatty foods, like fried foods. Citrus fruits, like oranges or lemon. Other foods and drinks that contain acid, like orange juice or tomatoes. Spicy  food. Chocolate. Eat frequent small meals instead of three large meals a day. This helps prevent your stomach from getting too full. Eat slowly. Do not lie down right after eating. Do not eat 1-2 hours before bed. Do not drink beverages with caffeine. These include cola, coffee, cocoa, and tea. Do not drink alcohol. General instructions Take over-the-counter and prescription medicines only as told by your health care provider. Keep all follow-up visits. Your health care provider will want to check that any new prescribed medicines are helping your symptoms. Contact a health care provider if: Your symptoms are not controlled with medicines or lifestyle changes. You are having trouble swallowing. You have coughing or wheezing that will not go away. Your pain is getting worse. Your pain spreads to your arms, neck, jaw, teeth, or back. You feel nauseous or you vomit. Get help right away if: You have shortness of breath. You vomit blood. You have bright red blood in your stools. You have black, tarry stools. These symptoms may be an emergency. Get help right away. Call 911. Do not wait to see if the symptoms will go away. Do not drive yourself to the hospital. Summary A hiatal hernia occurs when part of the stomach slides above the muscle that separates the abdomen from the chest. A person may be born with a weakness in the hiatus, or a weakness can develop over time. Symptoms of a hiatal hernia may include heartburn, trouble swallowing, or sore throat. Management of a hiatal hernia includes eating frequent small meals instead of three large meals a day. Get help right away if you vomit blood, have bright red blood in your stools, or have black, tarry stools. This information is not intended to replace advice given to you by your health care provider. Make sure you discuss any questions you have with your health care provider. Document Revised: 08/29/2021 Document Reviewed:  08/29/2021 Elsevier Patient Education  2024 [employer]   Calorie Counting for [employer]  Calories are units of energy. Your body needs a certain number of calories from food to keep going throughout the day. When you eat or drink more calories than your body needs, your body stores the extra calories mostly as fat. When you eat or drink fewer calories than your body needs, your body burns fat to get the energy it needs. Calorie counting means keeping track of how many calories you eat and drink each day. Calorie counting can be helpful if you need to lose weight. If you eat fewer calories than your body needs, you should lose weight. Ask your health care provider what a healthy weight is for you. For calorie counting to work, you will need to eat the right number of calories each day to lose a healthy amount of weight per week. A dietitian can help you figure out how many calories you need in a day and will suggest ways to reach your calorie goal. A healthy amount of weight to lose each week is usually 1-2 lb (0.5-0.9 kg). This usually means that your daily calorie intake should be reduced by 500-750 calories. Eating 1,200-1,500 calories a day can help most women lose weight. Eating 1,500-1,800 calories a day can help most men lose weight. What  do I need to know about calorie counting? Work with your health care provider or dietitian to determine how many calories you should get each day. To meet your daily calorie goal, you will need to: Find out how many calories are in each food that you would like to eat. Try to do this before you eat. Decide how much of the food you plan to eat. Keep a food log. Do this by writing down what you ate and how many calories it had. To successfully lose weight, it is important to balance calorie counting with a healthy lifestyle that includes regular activity. Where do I find calorie information?  The number of calories in a food can be found on a Nutrition  Facts label. If a food does not have a Nutrition Facts label, try to look up the calories online or ask your dietitian for help. Remember that calories are listed per serving. If you choose to have more than one serving of a food, you will have to multiply the calories per serving by the number of servings you plan to eat. For example, the label on a package of bread might say that a serving size is 1 slice and that there are 90 calories in a serving. If you eat 1 slice, you will have eaten 90 calories. If you eat 2 slices, you will have eaten 180 calories. How do I keep a food log? After each time that you eat, record the following in your food log as soon as possible: What you ate. Be sure to include toppings, sauces, and other extras on the food. How much you ate. This can be measured in cups, ounces, or number of items. How many calories were in each food and drink. The total number of calories in the food you ate. Keep your food log near you, such as in a pocket-sized notebook or on an app or website on your mobile phone. Some programs will calculate calories for you and show you how many calories you have left to meet your daily goal. What are some portion-control tips? Know how many calories are in a serving. This will help you know how many servings you can have of a certain food. Use a measuring cup to measure serving sizes. You could also try weighing out portions on a kitchen scale. With time, you will be able to estimate serving sizes for some foods. Take time to put servings of different foods on your favorite plates or in your favorite bowls and cups so you know what a serving looks like. Try not to eat straight from a food's packaging, such as from a bag or box. Eating straight from the package makes it hard to see how much you are eating and can lead to overeating. Put the amount you would like to eat in a cup or on a plate to make sure you are eating the right portion. Use smaller  plates, glasses, and bowls for smaller portions and to prevent overeating. Try not to multitask. For example, avoid watching TV or using your computer while eating. If it is time to eat, sit down at a table and enjoy your food. This will help you recognize when you are full. It will also help you be more mindful of what and how much you are eating. What are tips for following this plan? Reading food labels Check the calorie count compared with the serving size. The serving size may be smaller than what you are used to  eating. Check the source of the calories. Try to choose foods that are high in protein, fiber, and vitamins, and low in saturated fat, trans fat, and sodium. Shopping Read nutrition labels while you shop. This will help you make healthy decisions about which foods to buy. Pay attention to nutrition labels for low-fat or fat-free foods. These foods sometimes have the same number of calories or more calories than the full-fat versions. They also often have added sugar, starch, or salt to make up for flavor that was removed with the fat. Make a grocery list of lower-calorie foods and stick to it. Cooking Try to cook your favorite foods in a healthier way. For example, try baking instead of frying. Use low-fat dairy products. Meal planning Use more fruits and vegetables. One-half of your plate should be fruits and vegetables. Include lean proteins, such as chicken, [place], and fish. Lifestyle Each week, aim to do one of the following: 150 minutes of moderate exercise, such as walking. 75 minutes of vigorous exercise, such as running. General information Know how many calories are in the foods you eat most often. This will help you calculate calorie counts faster. Find a way of tracking calories that works for you. Get creative. Try different apps or programs if writing down calories does not work for you. What foods should I eat?  Eat nutritious foods. It is better to have a  nutritious, high-calorie food, such as an avocado, than a food with few nutrients, such as a bag of potato chips. Use your calories on foods and drinks that will fill you up and will not leave you hungry soon after eating. Examples of foods that fill you up are nuts and nut butters, vegetables, lean proteins, and high-fiber foods such as whole grains. High-fiber foods are foods with more than 5 g of fiber per serving. Pay attention to calories in drinks. Low-calorie drinks include water and unsweetened drinks. The items listed above may not be a complete list of foods and beverages you can eat. Contact a dietitian for more information. What foods should I limit? Limit foods or drinks that are not good sources of vitamins, minerals, or protein or that are high in unhealthy fats. These include: Candy. Other sweets. Sodas, specialty coffee drinks, alcohol, and juice. The items listed above may not be a complete list of foods and beverages you should avoid. Contact a dietitian for more information. How do I count calories when eating out? Pay attention to portions. Often, portions are much larger when eating out. Try these tips to keep portions smaller: Consider sharing a meal instead of getting your own. If you get your own meal, eat only half of it. Before you start eating, ask for a container and put half of your meal into it. When available, consider ordering smaller portions from the menu instead of full portions. Pay attention to your food and drink choices. Knowing the way food is cooked and what is included with the meal can help you eat fewer calories. If calories are listed on the menu, choose the lower-calorie options. Choose dishes that include vegetables, fruits, whole grains, low-fat dairy products, and lean proteins. Choose items that are boiled, broiled, grilled, or steamed. Avoid items that are buttered, battered, fried, or served with cream sauce. Items labeled as crispy are usually  fried, unless stated otherwise. Choose water, low-fat milk, unsweetened iced tea, or other drinks without added sugar. If you want an alcoholic beverage, choose a lower-calorie option, such as a  glass of wine or light beer. Ask for dressings, sauces, and syrups on the side. These are usually high in calories, so you should limit the amount you eat. If you want a salad, choose a garden salad and ask for grilled meats. Avoid extra toppings such as bacon, cheese, or fried items. Ask for the dressing on the side, or ask for olive oil and vinegar or lemon to use as dressing. Estimate how many servings of a food you are given. Knowing serving sizes will help you be aware of how much food you are eating at restaurants. Where to find more information Centers for Disease Control and Prevention: FootballExhibition.com.br U.[place]. Department of Agriculture: WrestlingReporter.dk Summary Calorie counting means keeping track of how many calories you eat and drink each day. If you eat fewer calories than your body needs, you should lose weight. A healthy amount of weight to lose per week is usually 1-2 lb (0.5-0.9 kg). This usually means reducing your daily calorie intake by 500-750 calories. The number of calories in a food can be found on a Nutrition Facts label. If a food does not have a Nutrition Facts label, try to look up the calories online or ask your dietitian for help. Use smaller plates, glasses, and bowls for smaller portions and to prevent overeating. Use your calories on foods and drinks that will fill you up and not leave you hungry shortly after a meal. This information is not intended to replace advice given to you by your health care provider. Make sure you discuss any questions you have with your health care provider. Document Revised: 08/13/2019 Document Reviewed: 08/13/2019 Elsevier Patient Education  2023 [employer].

## 2023-01-01 ENCOUNTER — Other Ambulatory Visit: Payer: Self-pay | Admitting: Cardiovascular Disease

## 2023-01-01 ENCOUNTER — Ambulatory Visit: Payer: 59 | Admitting: Internal Medicine

## 2023-01-01 ENCOUNTER — Emergency Department
Admission: EM | Admit: 2023-01-01 | Discharge: 2023-01-01 | Disposition: A | Discharge status: Home / Self Care | Payer: 59 | Attending: Emergency Medicine | Admitting: Emergency Medicine

## 2023-01-01 ENCOUNTER — Emergency Department: Payer: 59

## 2023-01-01 ENCOUNTER — Other Ambulatory Visit: Payer: Self-pay

## 2023-01-01 DIAGNOSIS — I5030 Unspecified diastolic (congestive) heart failure: Secondary | ICD-10-CM

## 2023-01-01 DIAGNOSIS — I509 Heart failure, unspecified: Secondary | ICD-10-CM | POA: Diagnosis not present

## 2023-01-01 DIAGNOSIS — Z23 Encounter for immunization: Secondary | ICD-10-CM | POA: Insufficient documentation

## 2023-01-01 DIAGNOSIS — S60222A Contusion of left hand, initial encounter: Secondary | ICD-10-CM | POA: Diagnosis not present

## 2023-01-01 DIAGNOSIS — W1830XA Fall on same level, unspecified, initial encounter: Secondary | ICD-10-CM | POA: Insufficient documentation

## 2023-01-01 DIAGNOSIS — S0083XA Contusion of other part of head, initial encounter: Secondary | ICD-10-CM | POA: Insufficient documentation

## 2023-01-01 DIAGNOSIS — M79641 Pain in right hand: Secondary | ICD-10-CM | POA: Diagnosis not present

## 2023-01-01 DIAGNOSIS — R9431 Abnormal electrocardiogram [ECG] [EKG]: Secondary | ICD-10-CM | POA: Diagnosis not present

## 2023-01-01 DIAGNOSIS — W19XXXA Unspecified fall, initial encounter: Secondary | ICD-10-CM

## 2023-01-01 DIAGNOSIS — S61411A Laceration without foreign body of right hand, initial encounter: Secondary | ICD-10-CM | POA: Diagnosis not present

## 2023-01-01 DIAGNOSIS — Z043 Encounter for examination and observation following other accident: Secondary | ICD-10-CM | POA: Diagnosis not present

## 2023-01-01 LAB — CBC
HCT: 38.8 % (ref 36.0–46.0)
Hemoglobin: 12.4 g/dL (ref 12.0–15.0)
MCH: 28.6 pg (ref 26.0–34.0)
MCHC: 32 g/dL (ref 30.0–36.0)
MCV: 89.4 fL (ref 80.0–100.0)
Platelets: 282 10*3/uL (ref 150–400)
RBC: 4.34 MIL/uL (ref 3.87–5.11)
RDW: 12.7 % (ref 11.5–15.5)
WBC: 7 10*3/uL (ref 4.0–10.5)
nRBC: 0 % (ref 0.0–0.2)

## 2023-01-01 LAB — BASIC METABOLIC PANEL
Anion gap: 8 (ref 5–15)
BUN: 12 mg/dL (ref 6–20)
CO2: 27 mmol/L (ref 22–32)
Calcium: 8.6 mg/dL — ABNORMAL LOW (ref 8.9–10.3)
Chloride: 103 mmol/L (ref 98–111)
Creatinine, Ser: 0.63 mg/dL (ref 0.44–1.00)
GFR, Estimated: >60 mL/min (ref >60)
Glucose, Bld: 113 mg/dL — ABNORMAL HIGH (ref 70–99)
Potassium: 3.7 mmol/L (ref 3.5–5.1)
Sodium: 138 mmol/L (ref 135–145)

## 2023-01-01 MED ORDER — SODIUM CHLORIDE 0.9 % IV BOLUS
1000.0000 mL | Freq: Once | INTRAVENOUS | Status: AC
  Administered 2023-01-01: 1000 mL via INTRAVENOUS

## 2023-01-01 MED ORDER — LIDOCAINE HCL (PF) 1 % IJ SOLN
5.0000 mL | Freq: Once | INTRAMUSCULAR | Status: AC
  Administered 2023-01-01: 5 mL via INTRADERMAL
  Filled 2023-01-01: qty 5

## 2023-01-01 MED ORDER — SODIUM CHLORIDE 0.9 % IV BOLUS
500.0000 mL | Freq: Once | INTRAVENOUS | Status: AC
  Administered 2023-01-01: 500 mL via INTRAVENOUS

## 2023-01-01 MED ORDER — TETANUS-DIPHTH-ACELL PERTUSSIS 5-2.5-18.5 LF-MCG/0.5 IM SUSY
0.5000 mL | PREFILLED_SYRINGE | Freq: Once | INTRAMUSCULAR | Status: AC
  Administered 2023-01-01: 0.5 mL via INTRAMUSCULAR
  Filled 2023-01-01: qty 0.5

## 2023-01-01 NOTE — ED Provider Notes (Signed)
[facility] Provider Note    Event Date/Time   First MD Initiated Contact with Patient 01/01/23 1158     (approximate)   History   Chief Complaint: Fall   HPI  Alexis Christensen is a 52 y.o. female with a past history of bipolar disorder, DVT, CHF who reports being in her usual state of health today, and while walking outside she felt generalized weakness and then fell over.  Denies loss of consciousness.  Reports that she fell onto both hands and her face.  Denies headache or neck pain.  No paresthesias.  No vision changes.  No chest pain or shortness of breath or other acute symptoms.  Patient reports that she is recently moving and so has been spending a lot of time outside moving her belongings.  Whether lately has been over 90 degrees and sunny every day.     Physical Exam   Triage Vital Signs: ED Triage Vitals  Enc Vitals Group     BP 01/01/23 1148 (!) 85/69     Pulse Rate 01/01/23 1146 74     Resp 01/01/23 1146 18     Temp 01/01/23 1146 97.6 F (36.4 C)     Temp src --      SpO2 01/01/23 1146 97 %     Weight 01/01/23 1146 235 lb 14.3 oz (107 kg)     Height 01/01/23 1146 5\' 4"  (1.626 m)     Head Circumference --      Peak Flow --      Pain Score 01/01/23 1146 8     Pain Loc --      Pain Edu? --      Excl. in GC? --     Most recent vital signs: Vitals:   01/01/23 1430 01/01/23 1500  BP: 132/74 134/82  Pulse: 60 (!) 58  Resp:    Temp:    SpO2: 97% 96%    General: Awake, no distress.  CV:  Good peripheral perfusion. Rrr   Resp:  Normal effort. stab Abd:  No distention. Soft, nt Other:  1.5 cm lac on palmar surface of hand just proximal to the 3rd MCP joint. Intact ROM. No FB. Wound visualized through FROM and through full depth in bloodless field. L hand has ttp over 4th and 5th MC. There is swelling over the left maxilla.   ED Results / Procedures / Treatments   Labs (all labs ordered are listed, but only abnormal results are  displayed) Labs Reviewed  BASIC METABOLIC PANEL - Abnormal; Notable for the following components:      Result Value   Glucose, Bld 113 (*)    Calcium 8.6 (*)    All other components within normal limits  CBC     EKG Interpreted by me NSR, rate 69. Normal axis, intervals. Prwp. Normal ST segments and T waves.   RADIOLOGY CT head interpreted by me, appears unremarkable. Radiology report reviewed. CT maxillofacial negative.   XR L and R hand negative for fx or FB.   PROCEDURES:  .Marland KitchenLaceration Repair  Date/Time: 01/01/2023 3:20 PM  Performed by: Sharman Cheek, MD Authorized by: Sharman Cheek, MD   Consent:    Consent obtained:  Verbal   Consent given by:  Patient   Risks discussed:  Infection, poor wound healing and retained foreign body   Alternatives discussed:  No treatment Laceration details:    Location:  Hand   Hand location:  R palm   Length (  cm):  1.5 Pre-procedure details:    Preparation:  Imaging obtained to evaluate for foreign bodies Exploration:    Imaging obtained: x-ray     Imaging outcome: foreign body not noted     Wound exploration: wound explored through full range of motion and entire depth of wound visualized     Wound extent: no foreign body, no signs of injury, no nerve damage, no tendon damage, no underlying fracture and no vascular damage     Contaminated: no   Treatment:    Area cleansed with:  Povidone-iodine Skin repair:    Repair method:  Sutures   Suture size:  5-0   Suture material:  Nylon   Suture technique:  Simple interrupted   Number of sutures:  3 Approximation:    Approximation:  Close Repair type:    Repair type:  Simple Post-procedure details:    Dressing:  Open (no dressing)   Procedure completion:  Tolerated well, no immediate complications    MEDICATIONS ORDERED IN ED: Medications  sodium chloride 0.9 % bolus 500 mL (0 mLs Intravenous Stopped 01/01/23 1342)  Tdap (BOOSTRIX) injection 0.5 mL (0.5 mLs  Intramuscular Given 01/01/23 1345)  sodium chloride 0.9 % bolus 1,000 mL (1,000 mLs Intravenous New Bag/Given 01/01/23 1236)  lidocaine (PF) (XYLOCAINE) 1 % injection 5 mL (5 mLs Intradermal Given 01/01/23 1349)     IMPRESSION / MDM / ASSESSMENT AND PLAN / ED COURSE  I reviewed the triage vital signs and the nursing notes.  DDx: Intracranial hemorrhage, facial fracture, right hand fracture, left hand fracture, dehydration, electrolyte abnormality, anemia  Patient's presentation is most consistent with acute presentation with potential threat to life or bodily function.  Patient presents with fall, possibly orthostatic related to dehydration or syncope.  No acute neuro, vascular, pulm or cardiopulmonary symptoms.  Patient given IV fluids and vital signs remained normal throughout ED evaluation.  Labs and imaging unremarkable.  Laceration on the hand repaired.  Tetanus updated.  Ambulatory without symptoms.  Stable for discharge       FINAL CLINICAL IMPRESSION(S) / ED DIAGNOSES   Final diagnoses:  Fall, initial encounter  Laceration of right hand without foreign body, initial encounter  Contusion of left hand, initial encounter  Contusion of face, initial encounter     Rx / DC Orders   ED Discharge Orders     None        Note:  This document was prepared using Dragon voice recognition software and may include unintentional dictation errors.   Sharman Cheek, MD 01/01/23 [phone]

## 2023-01-01 NOTE — ED Triage Notes (Signed)
Pt to ED for fall while walking, states feels like legs gave out, not sure if from neuropathy. C/o laceration to bilateral hands, and possible abrasion/burning to to left cheek. Denies LOC, no blood thinner use. NAD noted.  Hypotensive on arrival

## 2023-01-01 NOTE — ED Notes (Signed)
X-ray at bedside

## 2023-01-01 NOTE — Discharge Instructions (Signed)
Your xrays and CT scans do not show any fractures or other serious injuries. Please see your doctor in 1 week for removal of the stitches in your right hand. Drink lots of water to stay well-hydrated in the hot weather.

## 2023-01-02 ENCOUNTER — Encounter: Payer: Self-pay | Admitting: Nurse Practitioner

## 2023-01-03 ENCOUNTER — Other Ambulatory Visit (INDEPENDENT_AMBULATORY_CARE_PROVIDER_SITE_OTHER): Payer: Self-pay | Admitting: Family Medicine

## 2023-01-03 ENCOUNTER — Ambulatory Visit: Payer: 59

## 2023-01-03 ENCOUNTER — Ambulatory Visit (INDEPENDENT_AMBULATORY_CARE_PROVIDER_SITE_OTHER): Payer: 59 | Admitting: Family Medicine

## 2023-01-03 ENCOUNTER — Encounter (INDEPENDENT_AMBULATORY_CARE_PROVIDER_SITE_OTHER): Payer: Self-pay | Admitting: Family Medicine

## 2023-01-03 VITALS — BP 98/67 | HR 69 | Temp 97.6°F | Ht 64.0 in | Wt 234.0 lb

## 2023-01-03 DIAGNOSIS — Z6837 Body mass index (BMI) 37.0-37.9, adult: Secondary | ICD-10-CM

## 2023-01-03 DIAGNOSIS — I10 Essential (primary) hypertension: Secondary | ICD-10-CM

## 2023-01-03 DIAGNOSIS — G479 Sleep disorder, unspecified: Secondary | ICD-10-CM | POA: Diagnosis not present

## 2023-01-03 DIAGNOSIS — Z6841 Body Mass Index (BMI) 40.0 and over, adult: Secondary | ICD-10-CM

## 2023-01-03 DIAGNOSIS — E538 Deficiency of other specified B group vitamins: Secondary | ICD-10-CM | POA: Diagnosis not present

## 2023-01-03 DIAGNOSIS — J4520 Mild intermittent asthma, uncomplicated: Secondary | ICD-10-CM | POA: Diagnosis not present

## 2023-01-03 DIAGNOSIS — E559 Vitamin D deficiency, unspecified: Secondary | ICD-10-CM

## 2023-01-03 DIAGNOSIS — R7303 Prediabetes: Secondary | ICD-10-CM

## 2023-01-03 DIAGNOSIS — R0609 Other forms of dyspnea: Secondary | ICD-10-CM | POA: Diagnosis not present

## 2023-01-03 MED ORDER — METFORMIN HCL 500 MG PO TABS
500.0000 mg | ORAL_TABLET | Freq: Two times a day (BID) | ORAL | 0 refills | Status: DC
Start: 2023-01-03 — End: 2023-01-31

## 2023-01-03 MED ORDER — VITAMIN D (ERGOCALCIFEROL) 1.25 MG (50000 UNIT) PO CAPS
ORAL_CAPSULE | ORAL | 0 refills | Status: DC
Start: 2023-01-03 — End: 2023-01-31

## 2023-01-03 MED ORDER — RYBELSUS 3 MG PO TABS: 3.0000 mg | ORAL_TABLET | Freq: Every day | ORAL | 0 refills | Status: DC

## 2023-01-03 MED ORDER — CYANOCOBALAMIN 500 MCG PO TABS
500.0000 ug | ORAL_TABLET | Freq: Every day | ORAL | 0 refills | Status: DC
Start: 2023-01-03 — End: 2023-01-31

## 2023-01-03 NOTE — Progress Notes (Signed)
Alexis Christensen, D.O.  ABFM, ABOM Specializing in Clinical Bariatric Medicine  Office located at: [address] [place], [place]  [address]     Assessment and Plan:   Orders Placed This Encounter  Procedures   VITAMIN D 25 Hydroxy (Vit-D Deficiency, Fractures)   Hemoglobin A1c   Vitamin B12    Medications Discontinued During This Encounter  Medication Reason   cyanocobalamin (VITAMIN B12) 500 MCG tablet Reorder   metFORMIN (GLUCOPHAGE) 500 MG tablet Reorder   Semaglutide (RYBELSUS) 3 MG TABS Reorder   Vitamin D, Ergocalciferol, (DRISDOL) 1.25 MG (50000 UNIT) CAPS capsule Reorder     Meds ordered this encounter  Medications   Vitamin D, Ergocalciferol, (DRISDOL) 1.25 MG (50000 UNIT) CAPS capsule    Sig: 1 po q wed and 1 po q sun    Dispense:  8 capsule    Refill:  0    30 d supply;  ** OV for RF **   Do not send RF request   metFORMIN (GLUCOPHAGE) 500 MG tablet    Sig: Take 1 tablet (500 mg total) by mouth 2 (two) times daily with a meal.    Dispense:  60 tablet    Refill:  0    30 d supply;  ** OV for RF **   Do not send RF request   Semaglutide (RYBELSUS) 3 MG TABS    Sig: Take 1 tablet (3 mg total) by mouth daily.    Dispense:  30 tablet    Refill:  0   cyanocobalamin (VITAMIN B12) 500 MCG tablet    Sig: Take 1 tablet (500 mcg total) by mouth daily.    Dispense:  90 tablet    Refill:  0     Vitamin D deficiency Assessment: Condition is being treated with Ergocalciferol 50K IU twice weekly. Denies any adverse effects.   Lab Results  Component Value Date   VD25OH 16.2 (L) 07/03/2022   VD25OH 19.6 (L) 02/14/2022   VD25OH 25.7 (L) 08/01/2021   Plan: Recheck labs. Continue with prescribed supplement at current dose Will refill today.    Weight loss will likely improve availability of vitamin D, thus encouraged Alexis Christensen to continue with meal plan and their weight loss efforts to further improve this condition.  Thus, we will need to monitor levels regularly  (every 3-4 mo on average) to keep levels within normal limits and prevent over supplementation   Pre-diabetes Assessment: Condition is being treated with Metformin 500 mg BID and Rybelsus 3 mg once daily. Denies any N/V/D or other GI upset. Her hunger and cravings are better controlled when following the meal plan.   Lab Results  Component Value Date   HGBA1C 6.0 (H) 10/08/2022   HGBA1C 6.0 06/13/2022   HGBA1C 5.9 02/01/2022   INSULIN 13.8 02/14/2022   INSULIN 6.4 08/01/2021   INSULIN 7.4 03/28/2021     Plan: Recheck labs. Continue with Rybelsus and Metformin at current doses.  Will refill both today.     Continue her prudent nutritional plan that is low in simple carbohydrates, saturated fats and trans fats to goal of 5-10% weight loss to achieve significant health benefits.  Pt encouraged to continually advance exercise and cardiovascular fitness as tolerated throughout weight loss journey.     Other specified hypothyroidism ***   B12 deficiency Assessment: Condition is not at goal. She is currently taking OTC Vitamin B12 500 mcg and endorses tolerating supplement well.   Lab Results  Component Value  Date   VITAMINB12 366 07/03/2022    Plan: Recheck labs. Continue with supplement as prescribed.   Continue their prudent nutritional plan and focus on b12 rich foods such as lean red meats; poultry; eggs; seafood; beans, peas, and lentils; nuts and seeds; and soy products. We will continue to monitor as deemed clinically necessary.     Essential hypertension Assessment: Her blood pressure is slightly low today. Patient is completely asymptomatic. Denies any lightheadedness or dizziness. She has been compliant with Lopressor 50 mg BID and Aldactone 25 mg 1/2 half tablet once daily. Denies any side effects.   Last 3 blood pressure readings in our office are as follows: BP Readings from Last 3 Encounters:  01/03/23 98/67  01/01/23 122/72  12/31/22 118/80   Plan: I discussed  with pt that    BP is *** at goal today.  Counseled Alexis Christensen on pathophysiology of disease and discussed treatment plan, which always includes dietary and lifestyle modification as first line.  Lifestyle changes such as following our low salt, heart healthy meal plan and engaging in a regular exercise program discussed  - Avoid buying foods that are: processed, frozen, or prepackaged to avoid excess salt. - Ambulatory blood pressure monitoring encouraged.  Reminded patient that if they ever feel poorly in any way, to check their blood pressure and pulse as well. - We will continue to monitor closely alongside PCP/ specialists.  Pt reminded to also f/up with those individuals as instructed by them.  - We will continue to monitor symptoms as they relate to the her weight loss journey.  Lopressor and aldactone        TREATMENT PLAN FOR OBESITY: BMI 37.0-37.9, adult-current bmi 37.6 Morbid obesity (HCC) Assessment: Alexis Christensen is here to discuss her progress with her obesity treatment plan along with follow-up of her obesity related diagnoses. See Medical Weight Management Flowsheet for complete bioelectrical impedance results.  Condition is stable Biometric data collected today, was reviewed with patient.   Since last office visit on 12/20/22 patient's  Muscle mass has decreased by 1.2 lb. Fat mass has increased by 0.6lb. Total body water has increased by 2.6 lb.  Counseling done on how various foods will affect these numbers and how to maximize success  Total lbs lost to date:  Total weight loss percentage to date:   Plan: Continue the Category 1 meal plan with breakfast and lunch options.   - Recommended pt to explore the [employer].   Behavioral Intervention Additional resources provided today: patient declined Evidence-based interventions for health behavior change were utilized today including the discussion of self monitoring techniques, problem-solving  barriers and SMART goal setting techniques.   Regarding patient's less desirable eating habits and patterns, we employed the technique of small changes.  Pt will specifically work on: continue with prescribed meal plan and increasing lean protein intake for next visit.    Recommended Physical Activity Goals  Alexis Christensen has been advised to slowly work up to 150 minutes of moderate intensity aerobic activity a week and strengthening exercises 2-3 times per week for cardiovascular health, weight loss maintenance and preservation of muscle mass.   She has agreed to Continue current level of physical activity   FOLLOW UP: Return in about 4 weeks (around 01/31/2023). She was informed of the importance of frequent follow up visits to maximize her success with intensive lifestyle modifications for her multiple health conditions.  Alexis Christensen is aware that we will review all  of her lab results at our next visit.  She is aware that if anything is critical/ life threatening with the results, we will be contacting her via MyChart prior to the office visit to discuss management.   Subjective:   Chief complaint: Obesity Alexis Christensen is here to discuss her progress with her obesity treatment plan. She is on the the Category 1 Plan with breakfast and lunch options and states she is following her eating plan approximately 90% of the time. She states she is walking 30 minutes 3 days per week.  Interval History:  Alexis Christensen is here for a follow up office visit.   On 6/18, pt was moving furniture/belongings and was out in the heat for a prolonged period. She then fell and went to the ED. She had a negative CT of head and x-rays of her right and left hands. She also had a laceration repair on right palmar surface with 3 simple interrupted sutures. She was given a T-DAP and discharge instructions.   As it relates to the meal plan, she endorses following it more. She has been doing well with getting her proteins in for dinner,  but sometimes struggles with eating on plan for either breakfast or lunch. She endorses drinking approximately 100 ounces of water daily.   Pharmacotherapy for weight loss: She is currently taking  Rybelsus and Metformin  for medical weight loss.  Denies side effects.    Review of Systems:  Pertinent positives were addressed with patient today.  Reviewed by clinician on day of visit: allergies, medications, problem list, medical history, surgical history, family history, social history, and previous encounter notes.  Weight Summary and Biometrics   Weight Lost Since Last Visit: 0  Weight Gained Since Last Visit: 0    Vitals Temp: 97.6 F (36.4 C) BP: 98/67 Pulse Rate: 69 SpO2: 100 %   Anthropometric Measurements Height: 5\' 4"  (1.626 m) Weight: 234 lb (106.1 kg) BMI (Calculated): 40.15 Weight at Last Visit: 234lb Weight Lost Since Last Visit: 0 Weight Gained Since Last Visit: 0 Starting Weight: 229lb Total Weight Loss (lbs): 0 lb (0 kg)   Body Composition  Body Fat %: 51.7 % Fat Mass (lbs): 121 lbs Muscle Mass (lbs): 107.4 lbs Total Body Water (lbs): 87.8 lbs Visceral Fat Rating : 16   Other Clinical Data Fasting: no Labs: no Today's Visit #: 23   Objective:   PHYSICAL EXAM: Blood pressure 98/67, pulse 69, temperature 97.6 F (36.4 C), height 5\' 4"  (1.626 m), weight 234 lb (106.1 kg), SpO2 100 %. Body mass index is 40.17 kg/m.  General: Well Developed, well nourished, and in no acute distress.  HEENT: Normocephalic, atraumatic Skin: Warm and dry, cap RF less 2 sec, good turgor Chest:  Normal excursion, shape, no gross abn Respiratory: speaking in full sentences, no conversational dyspnea NeuroM-Sk: Ambulates w/o assistance, moves * 4 Psych: A and O *3, insight good, mood-full  DIAGNOSTIC DATA REVIEWED:  BMET    Component Value Date/Time   NA 138 01/01/2023 1214   NA 141 10/08/2022 1602   NA 138 11/07/2012 0517   K 3.7 01/01/2023 1214   K 3.9  11/07/2012 0517   CL 103 01/01/2023 1214   CL 109 (H) 11/07/2012 0517   CO2 27 01/01/2023 1214   CO2 25 11/07/2012 0517   GLUCOSE 113 (H) 01/01/2023 1214   GLUCOSE 91 11/07/2012 0517   BUN 12 01/01/2023 1214   BUN 12 10/08/2022 1602   BUN 7 11/07/2012 0517  CREATININE 0.63 01/01/2023 1214   CREATININE 0.76 11/07/2012 0517   CALCIUM 8.6 (L) 01/01/2023 1214   CALCIUM 7.9 (L) 11/07/2012 0517   GFRNONAA >60 01/01/2023 1214   GFRNONAA >60 11/07/2012 0517   GFRAA >60 02/17/2020 0000   GFRAA >60 11/07/2012 0517   Lab Results  Component Value Date   HGBA1C 6.0 (H) 10/08/2022   HGBA1C 6.0 03/06/2021   Lab Results  Component Value Date   INSULIN 13.8 02/14/2022   INSULIN 7.4 03/28/2021   Lab Results  Component Value Date   TSH 15.200 (H) 10/08/2022   CBC    Component Value Date/Time   WBC 7.0 01/01/2023 1214   RBC 4.34 01/01/2023 1214   HGB 12.4 01/01/2023 1214   HGB 11.4 10/08/2022 1602   HCT 38.8 01/01/2023 1214   HCT 34.4 10/08/2022 1602   PLT 282 01/01/2023 1214   PLT 209 11/07/2012 0517   MCV 89.4 01/01/2023 1214   MCV 87 10/08/2022 1602   MCV 89 10/20/2012 1448   MCH 28.6 01/01/2023 1214   MCHC 32.0 01/01/2023 1214   RDW 12.7 01/01/2023 1214   RDW 12.8 10/08/2022 1602   RDW 13.0 10/20/2012 1448   Iron Studies    Component Value Date/Time   IRON 39 07/13/2016 1055   TIBC 408 07/13/2016 1055   FERRITIN 11 07/13/2016 1055   IRONPCTSAT 10 (L) 07/13/2016 1055   Lipid Panel     Component Value Date/Time   CHOL 196 10/08/2022 1602   TRIG 96 10/08/2022 1602   HDL 66 10/08/2022 1602   CHOLHDL 3.0 10/08/2022 1602   LDLCALC 113 (H) 10/08/2022 1602   Hepatic Function Panel     Component Value Date/Time   PROT 6.3 10/08/2022 1602   ALBUMIN 3.9 10/08/2022 1602   AST 20 10/08/2022 1602   ALT 23 10/08/2022 1602   ALKPHOS 118 10/08/2022 1602   BILITOT 0.3 10/08/2022 1602      Component Value Date/Time   TSH 15.200 (H) 10/08/2022 1602   Nutritional Lab  Results  Component Value Date   VD25OH 16.2 (L) 07/03/2022   VD25OH 19.6 (L) 02/14/2022   VD25OH 25.7 (L) 08/01/2021    Attestations:   I, Special Puri, acting as a Stage manager for Thomasene Lot, DO., have compiled all relevant documentation for today's office visit on behalf of Thomasene Lot, DO, while in the presence of [employer], DO.  I have reviewed the above documentation for accuracy and completeness, and I agree with the above. Alexis Christensen, D.O.  The 21st Century Cures Act was signed into law in 2016 which includes the topic of electronic health records.  This provides immediate access to information in MyChart.  This includes consultation notes, operative notes, office notes, lab results and pathology reports.  If you have any questions about what you read please let us know at your next visit so we can discuss your concerns and take corrective action if need be.  We are right here with you.

## 2023-01-04 LAB — HEMOGLOBIN A1C
Est. average glucose Bld gHb Est-mCnc: 128 mg/dL
Hgb A1c MFr Bld: 6.1 % — ABNORMAL HIGH (ref 4.8–5.6)

## 2023-01-04 LAB — VITAMIN B12: Vitamin B-12: 305 pg/mL (ref 232–1245)

## 2023-01-04 LAB — VITAMIN D 25 HYDROXY (VIT D DEFICIENCY, FRACTURES): Vit D, 25-Hydroxy: 19.1 ng/mL — ABNORMAL LOW (ref 30.0–100.0)

## 2023-01-06 DIAGNOSIS — Z87891 Personal history of nicotine dependence: Secondary | ICD-10-CM | POA: Diagnosis not present

## 2023-01-06 DIAGNOSIS — S61411A Laceration without foreign body of right hand, initial encounter: Secondary | ICD-10-CM | POA: Diagnosis not present

## 2023-01-06 DIAGNOSIS — L0889 Other specified local infections of the skin and subcutaneous tissue: Secondary | ICD-10-CM | POA: Diagnosis not present

## 2023-01-06 DIAGNOSIS — T8140XA Infection following a procedure, unspecified, initial encounter: Secondary | ICD-10-CM | POA: Diagnosis not present

## 2023-01-07 ENCOUNTER — Ambulatory Visit
Admission: RE | Admit: 2023-01-07 | Discharge: 2023-01-07 | Disposition: A | Discharge status: Home / Self Care | Payer: 59 | Source: Ambulatory Visit | Attending: Surgery | Admitting: Surgery

## 2023-01-07 DIAGNOSIS — K449 Diaphragmatic hernia without obstruction or gangrene: Secondary | ICD-10-CM | POA: Insufficient documentation

## 2023-01-07 DIAGNOSIS — K5730 Diverticulosis of large intestine without perforation or abscess without bleeding: Secondary | ICD-10-CM | POA: Diagnosis not present

## 2023-01-07 MED ORDER — IOHEXOL 300 MG/ML  SOLN
100.0000 mL | Freq: Once | INTRAMUSCULAR | Status: AC | PRN
  Administered 2023-01-07: 100 mL via INTRAVENOUS

## 2023-01-08 ENCOUNTER — Other Ambulatory Visit: Payer: Self-pay

## 2023-01-08 ENCOUNTER — Ambulatory Visit (INDEPENDENT_AMBULATORY_CARE_PROVIDER_SITE_OTHER): Payer: 59 | Admitting: Family Medicine

## 2023-01-08 ENCOUNTER — Emergency Department
Admission: EM | Admit: 2023-01-08 | Discharge: 2023-01-09 | Disposition: A | Discharge status: Home / Self Care | Payer: 59 | Attending: Emergency Medicine | Admitting: Emergency Medicine

## 2023-01-08 ENCOUNTER — Emergency Department: Payer: 59

## 2023-01-08 ENCOUNTER — Encounter: Payer: Self-pay | Admitting: Emergency Medicine

## 2023-01-08 ENCOUNTER — Ambulatory Visit: Payer: 59 | Admitting: Internal Medicine

## 2023-01-08 DIAGNOSIS — S60222D Contusion of left hand, subsequent encounter: Secondary | ICD-10-CM | POA: Insufficient documentation

## 2023-01-08 DIAGNOSIS — Z4802 Encounter for removal of sutures: Secondary | ICD-10-CM | POA: Insufficient documentation

## 2023-01-08 DIAGNOSIS — W1830XD Fall on same level, unspecified, subsequent encounter: Secondary | ICD-10-CM | POA: Insufficient documentation

## 2023-01-08 DIAGNOSIS — M19042 Primary osteoarthritis, left hand: Secondary | ICD-10-CM | POA: Diagnosis not present

## 2023-01-08 DIAGNOSIS — M79642 Pain in left hand: Secondary | ICD-10-CM | POA: Diagnosis not present

## 2023-01-08 DIAGNOSIS — S61411D Laceration without foreign body of right hand, subsequent encounter: Secondary | ICD-10-CM | POA: Diagnosis present

## 2023-01-08 NOTE — ED Triage Notes (Signed)
Pt presents ambulatory to triage via POV with complaints of suture removal of the R palm from last week. Pt also states that she has had an increase in pain in the L hand over the last few days as a result of the injury last week. No deformities noted. She notes having the hand evaluated last week but was told to come back for re-eval as needed. A&Ox4 at this time. Denies CP or SOB.

## 2023-01-09 NOTE — Discharge Instructions (Signed)
Please complete your antibiotics as prescribed by [facility].  If you notice fever, redness, swelling, stiffness in your hand, painful fingers, drainage of pus or other concerns please return to the closest emergency room right away.

## 2023-01-09 NOTE — ED Provider Notes (Signed)
[facility] Provider Note    Event Date/Time   First MD Initiated Contact with Patient 01/08/23 2352     (approximate)   History   Suture / Staple Removal and Hand Pain   HPI  Alexis Christensen is a 52 y.o. female here for suture removal.  She fell while moving furniture boxes about a week ago.  She sustained a laceration to her right hand that was repaired with sutures.  A couple days ago the area started to get slightly swollen and red and she was seen at [facility] and started to antibiotics.  She is continuing on those and reports the swelling and discomfort in the hand has improved greatly thereafter.  At this point she also reports that she has had soreness throughout the left hand since the fall but still able to use it but is quite sore when she opens or closes it.  No fevers or chills.     Physical Exam   Triage Vital Signs: ED Triage Vitals  Enc Vitals Group     BP 01/08/23 2234 117/84     Pulse Rate 01/08/23 2234 85     Resp 01/08/23 2234 18     Temp 01/08/23 2234 98.4 F (36.9 C)     Temp Source 01/08/23 2234 Oral     SpO2 01/08/23 2234 100 %     Weight 01/08/23 2233 234 lb 9.5 oz (106.4 kg)     Height 01/08/23 2233 5\' 4"  (1.626 m)     Head Circumference --      Peak Flow --      Pain Score --      Pain Loc --      Pain Edu? --      Excl. in GC? --     Most recent vital signs: Vitals:   01/08/23 2234  BP: 117/84  Pulse: 85  Resp: 18  Temp: 98.4 F (36.9 C)  SpO2: 100%     General: Awake, no distress.  CV:  Good peripheral perfusion.  Strong right radial pulse.  Warm capillary refill normal all digits of right hand Resp:  Normal effort.  Abd:  No distention.  Other:  Demonstrates good use of the right and left hand.  Some soreness and slight contusion over the left hand metacarpals, but relatively small.  No snuffbox tenderness.  Demonstrates good use of the hand.  No wrist deformity or forearm deformity Right hand  please see clinical media uploads.  A total of 4 sutures were removed with success and no bleeding discharge or evidence of underlying infection.  She reports very slight tenderness in the area around her sutures but there is no evidence of circumferential edema, erythema, tenosynovitis, tenderness over the flexor tendon sheath etc.   ED Results / Procedures / Treatments   Labs (all labs ordered are listed, but only abnormal results are displayed) Labs Reviewed - No data to display   EKG     RADIOLOGY  Left hand x-ray interpreted by me as negative for gross fracture  DG Hand Complete Left  Result Date: 01/08/2023 CLINICAL DATA:  Pain. Suture removal of the right palm from last week. Fall injury last week. EXAM: LEFT HAND - COMPLETE 3+ VIEW COMPARISON:  Left hand radiographs 01/01/2023 FINDINGS: Normal bone mineralization. Neutral ulnar variance. Minimal thumb metacarpal joint space narrowing and peripheral osteophytosis. Minimal fifth DIP joint space narrowing. Minimal dorsal third and fourth DIP degenerative spurring. No acute fracture or dislocation.  IMPRESSION: Minimal osteoarthritis. No acute fracture. Electronically Signed   By: Neita Garnet M.D.   On: 01/08/2023 23:17      PROCEDURES:  Critical Care performed: No  .Suture Removal  Date/Time: 01/09/2023 3:21 AM  Performed by: Sharyn Creamer, MD Authorized by: Sharyn Creamer, MD   Consent:    Consent obtained:  Verbal   Consent given by:  Patient   Risks discussed:  Wound separation, pain and bleeding Universal protocol:    Relevant documents present and verified: yes     Patient identity confirmed:  Verbally with patient Location:    Location:  Upper extremity   Upper extremity location:  Hand   Hand location:  R hand Procedure details:    Wound appearance:  Tender   Number of sutures removed:  4   Number of staples removed:  0 Post-procedure details:    Post-removal:  Band-Aid applied   Procedure completion:   Tolerated well, no immediate complications    MEDICATIONS ORDERED IN ED: Medications - No data to display   IMPRESSION / MDM / ASSESSMENT AND PLAN / ED COURSE  I reviewed the triage vital signs and the nursing notes.                              Differential diagnosis includes, but is not limited to, suture removal.  Reevaluation for improvement for concerns of possible infection, overall based on her descriptor and clinical history it appears that this is improving and I encouraged her to continue her antibiotic therapy and we discussed careful return precautions.  Successful, uncomplicated removal of 4 sutures. (Noted as 3 in procedure ntoe prior, but 4 present and removed).  No bleeding or complication.  Patient is to remain on the 2 antibiotics she was prescribed to couple days ago by [facility].  She will continue to monitor for signs of infection but this point seems her symptoms are improving and there is no evidence to suggest deep tissue infection/tenosynovitis or extending cellulitis at this time.  Return precautions and treatment recommendations and follow-up discussed with the patient who is agreeable with the plan.   Patient's presentation is most consistent with acute complicated illness / injury requiring diagnostic workup.          FINAL CLINICAL IMPRESSION(S) / ED DIAGNOSES   Final diagnoses:  Contusion of left hand, subsequent encounter  Visit for suture removal     Rx / DC Orders   ED Discharge Orders     None        Note:  This document was prepared using Dragon voice recognition software and may include unintentional dictation errors.   Sharyn Creamer, MD 01/09/23 [phone]

## 2023-01-14 ENCOUNTER — Ambulatory Visit (INDEPENDENT_AMBULATORY_CARE_PROVIDER_SITE_OTHER): Payer: 59 | Admitting: Internal Medicine

## 2023-01-14 ENCOUNTER — Encounter: Payer: Self-pay | Admitting: Internal Medicine

## 2023-01-14 VITALS — BP 108/74 | HR 79 | Ht 64.0 in | Wt 237.0 lb

## 2023-01-14 DIAGNOSIS — S63502S Unspecified sprain of left wrist, sequela: Secondary | ICD-10-CM | POA: Insufficient documentation

## 2023-01-14 DIAGNOSIS — L089 Local infection of the skin and subcutaneous tissue, unspecified: Secondary | ICD-10-CM

## 2023-01-14 DIAGNOSIS — E782 Mixed hyperlipidemia: Secondary | ICD-10-CM | POA: Diagnosis not present

## 2023-01-14 DIAGNOSIS — T148XXA Other injury of unspecified body region, initial encounter: Secondary | ICD-10-CM | POA: Diagnosis not present

## 2023-01-14 HISTORY — DX: Unspecified sprain of left wrist, sequela: S63.502S

## 2023-01-14 HISTORY — DX: Local infection of the skin and subcutaneous tissue, unspecified: L08.9

## 2023-01-14 NOTE — Progress Notes (Signed)
Established Patient Office Visit  Subjective:  Patient ID: Alexis Christensen, female    DOB: 1971-03-26  Age: 52 y.o. MRN: [id number]  Chief Complaint  Patient presents with   Hospitalization Follow-up    Hospital follow-up    Patient comes in for her hospital follow-up.  She had sustained a fall on 01/01/2023 and had gone to the emergency room.  She had an open laceration on the palmar side of her right hand as well as a sprain of her left wrist.  Her  right hand was sutured and patient was started on oral antibiotics.  Patient then returned to the emergency room 5 days later with complaints of pain and swelling around the suture site.  It was concerning that she could have infection of the wound although the ultrasound did not show any abscess.  Patient was advised to continue taking her medications.  Although her sutures have been removed the area is slightly swollen and has some tenderness but she does not show any pus or any exudate.  The area is completely dry.   She also continues to have pain and tenderness along her left wrist.  She has an appointment to see an orthopedic tomorrow.  She will discuss referral to a hand surgeon at that time. Her x-rays were negative.    No other concerns at this time.   Past Medical History:  Diagnosis Date   Acute diastolic (congestive) heart failure (HCC)    ADHD    Adopted    Anemia    Anxiety    Arthritis    Asthma    B12 deficiency    Bilateral swelling of feet and ankles    Bipolar 1 disorder (HCC)    Bipolar disorder (HCC)    Carpal tunnel syndrome 09/15/2014   Chest pain    CHF (congestive heart failure) (HCC)    Dialostic CHF   Chronic diarrhea 09/07/2015   Chronic kidney disease    H/O KIDNEY STONES   Chronic venous insufficiency 04/26/2016   Depression    DVT (deep venous thrombosis) (HCC) 2016   RIGHT LEG   Edema leg    Effusion of knee 12/30/2013   Family history of adverse reaction to anesthesia    ADOPTED   Food  allergy    GERD (gastroesophageal reflux disease)    Goiter    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   Headache(784.0)    MIGRAINES   Heart burn    Heart murmur    History of blood clots    History of kidney stones    HLD (hyperlipidemia)    Hx MRSA infection    Hyperlipidemia    Hypothyroidism    Lipoma of arm 05/11/2013   Lower extremity edema    Lymphedema 04/26/2016   Other fatigue    Post traumatic stress disorder (PTSD)    Prediabetes    PVD (peripheral vascular disease) (HCC)    Seasonal allergies    Shortness of breath    Shortness of breath on exertion    Stress incontinence    Thyroid disease    Urge incontinence    Vitamin D deficiency     Past Surgical History:  Procedure Laterality Date   ABDOMINAL HYSTERECTOMY     ANKLE SURGERY     CHOLECYSTECTOMY     COLONOSCOPY WITH PROPOFOL N/A 06/24/2019   Procedure: COLONOSCOPY WITH PROPOFOL;  Surgeon: Toledo, Boykin Nearing, MD;  Location: [facility];  Service: Gastroenterology;  Laterality: N/A;  CYSTOSCOPY N/A 04/09/2016   Procedure: CYSTOSCOPY;  Surgeon: Alfredo Martinez, MD;  Location: [facility];  Service: Urology;  Laterality: N/A;   DILATION AND CURETTAGE OF UTERUS     ESOPHAGOGASTRODUODENOSCOPY (EGD) WITH PROPOFOL N/A 06/24/2019   Procedure: ESOPHAGOGASTRODUODENOSCOPY (EGD) WITH PROPOFOL;  Surgeon: Toledo, Boykin Nearing, MD;  Location: [facility];  Service: Gastroenterology;  Laterality: N/A;   INGUINAL HERNIA REPAIR Bilateral 04/18/2017   Procedure: LAPAROSCOPIC BILATERAL INGUINAL HERNIA REPAIR;  Surgeon: Leafy Ro, MD;  Location: [facility];  Service: General;  Laterality: Bilateral;   INTERSTIM IMPLANT PLACEMENT N/A 11/26/2016   Procedure: Leane Platt IMPLANT FIRST STAGE;  Surgeon: Alfredo Martinez, MD;  Location: [facility];  Service: Urology;  Laterality: N/A;   INTERSTIM IMPLANT PLACEMENT N/A 11/26/2016   Procedure: Leane Platt IMPLANT SECOND STAGE;  Surgeon: Alfredo Martinez, MD;  Location: [facility];  Service:  Urology;  Laterality: N/A;   JOINT REPLACEMENT Right    knee   KNEE ARTHROSCOPY Bilateral    KNEE ARTHROSCOPY WITH LATERAL RELEASE Left 10/18/2015   Procedure: KNEE ARTHROSCOPY LATERAL AND PARTIAL SYNOVECTOMY;  Surgeon: Kennedy Bucker, MD;  Location: [facility];  Service: Orthopedics;  Laterality: Left;   Lymph Node removal  2015   Neck   PUBOVAGINAL SLING N/A 04/09/2016   Procedure: PUBO-VAGINAL SLING/ RETROPUBIC SLING;  Surgeon: Alfredo Martinez, MD;  Location: [facility];  Service: Urology;  Laterality: N/A;   REPLACEMENT TOTAL KNEE Right    SHOULDER SURGERY Right 2014   TUBAL LIGATION     WRIST SURGERY Right    metal plate    Social History   Socioeconomic History   Marital status: Divorced    Spouse name: Not on file   Number of children: Not on file   Years of education: Not on file   Highest education level: Not on file  Occupational History   Occupation: Customer Service    Employer: [employer]  Tobacco Use   Smoking status: Former    Packs/day: 0.50    Years: 10.00    Additional pack years: 0.00    Total pack years: 5.00    Types: Cigarettes    Start date: 07/16/2008    Quit date: 07/16/2019    Years since quitting: 3.5    Passive exposure: Past   Smokeless tobacco: Former   Tobacco comments:    STRESS RELATED  Vaping Use   Vaping Use: Never used  Substance and Sexual Activity   Alcohol use: Not Currently    Comment: Hisotry of ETOH abouse ASB patient   Drug use: Yes    Types: Other-see comments    Comment: Pt listed history of abuse of "prescription drugs"   Sexual activity: Not Currently  Other Topics Concern   Not on file  Social History Narrative   Not on file   Social Determinants of Health   Financial Resource Strain: Not on file  Food Insecurity: Not on file  Transportation Needs: Not on file  Physical Activity: Not on file  Stress: Not on file  Social Connections: Not on file  Intimate Partner Violence: Not on file    Family History  Adopted:  Yes  Family history unknown: Yes    Allergies  Allergen Reactions   Geodon [Ziprasidone Hydrochloride] Other (See Comments)    Numbness ,sob, headaches, blurred vision   Sulfacetamide Sodium Hives   Ziprasidone Anaphylaxis    Other reaction(s): Other (See Comments) Numbness ,sob, headaches, blurred vision  Other reaction(s): Other (See Comments), Unknown Numbness ,sob, headaches, blurred vision  Numbness ,sob, headaches, blurred vision  Other reaction(s): Other (See Comments), Unknown Numbness ,sob, headaches, blurred vision Numbness ,sob, headaches, blurred vision   Numbness ,sob, headaches, blurred vision Other reaction(s): Other (See Comments), Unknown Numbness ,sob, headaches, blurred vision Numbness ,sob, headaches, blurred vision Other reaction(s): Other (See Comments), Unknown Numbness ,sob, headaches, blurred vision Numbness ,sob, headaches, blurred vision   Ziprasidone Hcl Anaphylaxis and Other (See Comments)    Other reaction(s): Other (See Comments), Unknown Numbness ,sob, headaches, blurred vision Numbness ,sob, headaches, blurred vision   Ace Inhibitors Rash   Erythromycin Rash and Hives   Hydromorphone Rash   Lamictal [Lamotrigine] Rash    Other reaction(s): Unknown   Strawberry (Diagnostic) Rash   Sulfa Antibiotics Hives and Rash    Other reaction(s): Unknown Other reaction(s): Unknown Other reaction(s): Unknown    Review of Systems  Constitutional: Negative.   HENT: Negative.    Eyes: Negative.   Respiratory: Negative.  Negative for cough and shortness of breath.   Cardiovascular: Negative.  Negative for chest pain, palpitations and leg swelling.  Gastrointestinal: Negative.  Negative for abdominal pain, constipation, diarrhea, heartburn, nausea and vomiting.  Genitourinary: Negative.  Negative for dysuria and flank pain.  Musculoskeletal: Negative.  Negative for joint pain and myalgias.  Skin: Negative.   Neurological: Negative.  Negative for  dizziness and headaches.  Endo/Heme/Allergies: Negative.   Psychiatric/Behavioral: Negative.  Negative for depression and suicidal ideas. The patient is not nervous/anxious.        Objective:   BP 108/74   Pulse 79   Ht 5\' 4"  (1.626 m)   Wt 237 lb (107.5 kg)   SpO2 97%   BMI 40.68 kg/m   Vitals:   01/14/23 1059  BP: 108/74  Pulse: 79  Height: 5\' 4"  (1.626 m)  Weight: 237 lb (107.5 kg)  SpO2: 97%  BMI (Calculated): 40.66    Physical Exam Vitals and nursing note reviewed.  Constitutional:      Appearance: Normal appearance.  HENT:     Head: Normocephalic and atraumatic.     Nose: Nose normal.     Mouth/Throat:     Mouth: Mucous membranes are moist.     Pharynx: Oropharynx is clear.  Eyes:     Conjunctiva/sclera: Conjunctivae normal.     Pupils: Pupils are equal, round, and reactive to light.  Cardiovascular:     Rate and Rhythm: Normal rate and regular rhythm.     Pulses: Normal pulses.     Heart sounds: Normal heart sounds. No murmur heard. Pulmonary:     Effort: Pulmonary effort is normal.     Breath sounds: Normal breath sounds. No wheezing.  Abdominal:     General: Bowel sounds are normal.     Palpations: Abdomen is soft.     Tenderness: There is no abdominal tenderness. There is no right CVA tenderness or left CVA tenderness.  Musculoskeletal:        General: Tenderness present. Normal range of motion.     Cervical back: Normal range of motion.     Right lower leg: No edema.     Left lower leg: No edema.  Skin:    General: Skin is warm and dry.  Neurological:     General: No focal deficit present.     Mental Status: She is alert and oriented to person, place, and time.  Psychiatric:        Mood and Affect: Mood normal.        Behavior: Behavior normal.  No results found for any visits on 01/14/23.  Recent Results (from the past 2160 hour(s))  VAS [name] ABI WITH/WO TBI     Status: None   Collection Time: 11/01/22  7:52 AM  Result Value Ref  Range   Right ABI 1.15    Left ABI 1.27   POCT Urinalysis Dipstick (16109)     Status: Abnormal   Collection Time: 11/26/22  3:42 PM  Result Value Ref Range   Color, UA yellow    Clarity, UA turbid    Glucose, UA Negative Negative   Bilirubin, UA negative    Ketones, UA negative    Spec Grav, UA >=1.030 (A) 1.010 - 1.025   Blood, UA negative    pH, UA 6.0 5.0 - 8.0   Protein, UA Negative Negative   Urobilinogen, UA 0.2 0.2 or 1.0 E.U./dL   Nitrite, UA negative    Leukocytes, UA Small (1+) (A) Negative   Appearance turbid    Odor none   POCT Urinalysis Dipstick (60454)     Status: Abnormal   Collection Time: 12/07/22 10:28 AM  Result Value Ref Range   Color, UA     Clarity, UA     Glucose, UA Negative Negative   Bilirubin, UA Negative    Ketones, UA Negative    Spec Grav, UA 1.020 1.010 - 1.025   Blood, UA Negative    pH, UA 6.0 5.0 - 8.0   Protein, UA Negative Negative   Urobilinogen, UA 0.2 0.2 or 1.0 E.U./dL   Nitrite, UA Negative    Leukocytes, UA Moderate (2+) (A) Negative   Appearance     Odor    Urinalysis, Complete     Status: Abnormal   Collection Time: 12/17/22  8:29 AM  Result Value Ref Range   Specific Gravity, UA >1.030 (H) 1.005 - 1.030   pH, UA 5.5 5.0 - 7.5   Color, UA Yellow Yellow   Appearance Ur Hazy (A) Clear   Leukocytes,UA 1+ (A) Negative   Protein,UA Negative Negative/Trace   Glucose, UA Negative Negative   Ketones, UA Negative Negative   RBC, UA Trace (A) Negative   Bilirubin, UA Negative Negative   Urobilinogen, Ur 0.2 0.2 - 1.0 mg/dL   Nitrite, UA Negative Negative   Microscopic Examination See below:   Microscopic Examination     Status: Abnormal   Collection Time: 12/17/22  8:29 AM   Urine  Result Value Ref Range   WBC, UA >30 (A) 0 - 5 /hpf   RBC, Urine 11-30 (A) 0 - 2 /hpf   Epithelial Cells (non renal) 0-10 0 - 10 /hpf   Mucus, UA Present (A) Not Estab.   Bacteria, UA Many (A) None seen/Few  Clostridium Difficile by PCR      Status: None   Collection Time: 12/17/22  8:30 AM   Specimen: STOOL   ST  Result Value Ref Range   Toxigenic C. Difficile by PCR Negative Negative  CULTURE, URINE COMPREHENSIVE     Status: Abnormal   Collection Time: 12/17/22  8:49 AM   Specimen: Urine   UR  Result Value Ref Range   Urine Culture, Comprehensive Final report (A)    Organism ID, Bacteria Escherichia coli (A)     Comment: Cefazolin <=4 ug/mL Cefazolin with an MIC <=16 predicts susceptibility to the oral agents cefaclor, cefdinir, cefpodoxime, cefprozil, cefuroxime, cephalexin, and loracarbef when used for therapy of uncomplicated urinary tract infections due to E. coli, Klebsiella pneumoniae, and  Proteus mirabilis. Greater than 100,000 colony forming units per mL    Organism ID, Bacteria Comment     Comment: Mixed urogenital flora 50,000-100,000 colony forming units per mL    ANTIMICROBIAL SUSCEPTIBILITY Comment     Comment:       ** S = Susceptible; I = Intermediate; R = Resistant **                    P = Positive; N = Negative             MICS are expressed in micrograms per mL    Antibiotic                 RSLT#1    RSLT#2    RSLT#3    RSLT#4 Amoxicillin/Clavulanic Acid    S Ampicillin                     S Cefepime                       S Ceftriaxone                    S Cefuroxime                     S Ciprofloxacin                  S Ertapenem                      S Gentamicin                     S Imipenem                       S Levofloxacin                   S Meropenem                      S Nitrofurantoin                 S Piperacillin/Tazobactam        S Tetracycline                   S Tobramycin                     S Trimethoprim/Sulfa             S   CBC     Status: None   Collection Time: 01/01/23 12:14 PM  Result Value Ref Range   WBC 7.0 4.0 - 10.5 K/uL   RBC 4.34 3.87 - 5.11 MIL/uL   Hemoglobin 12.4 12.0 - 15.0 g/dL   HCT 16.1 09.6 - 04.5 %   MCV 89.4 80.0 - 100.0 fL   MCH 28.6  26.0 - 34.0 pg   MCHC 32.0 30.0 - 36.0 g/dL   RDW 40.9 81.1 - 91.4 %   Platelets 282 150 - 400 K/uL   nRBC 0.0 0.0 - 0.2 %    Comment: Performed at [facility], [address] [place], [place] [address]  Basic metabolic panel     Status: Abnormal   Collection Time: 01/01/23 12:14 PM  Result Value Ref Range   Sodium 138 135 - 145 mmol/L   Potassium 3.7 3.5 - 5.1  mmol/L   Chloride 103 98 - 111 mmol/L   CO2 27 22 - 32 mmol/L   Glucose, Bld 113 (H) 70 - 99 mg/dL    Comment: Glucose reference range applies only to samples taken after fasting for at least 8 hours.   BUN 12 6 - 20 mg/dL   Creatinine, Ser 1.61 0.44 - 1.00 mg/dL   Calcium 8.6 (L) 8.9 - 10.3 mg/dL   GFR, Estimated >09 >60 mL/min    Comment: (NOTE) Calculated using the CKD-EPI Creatinine Equation (2021)    Anion gap 8 5 - 15    Comment: Performed at [facility], [address] [place], [place] [address]  Hemoglobin A1c     Status: Abnormal   Collection Time: 01/03/23  3:32 PM  Result Value Ref Range   Hgb A1c MFr Bld 6.1 (H) 4.8 - 5.6 %    Comment:          Prediabetes: 5.7 - 6.4          Diabetes: >6.4          Glycemic control for adults with diabetes: <7.0    Est. average glucose Bld gHb Est-mCnc 128 mg/dL  VITAMIN D 25 Hydroxy (Vit-D Deficiency, Fractures)     Status: Abnormal   Collection Time: 01/03/23  3:32 PM  Result Value Ref Range   Vit D, 25-Hydroxy 19.1 (L) 30.0 - 100.0 ng/mL    Comment: Vitamin D deficiency has been defined by the Institute of Medicine and an Endocrine Society practice guideline as a level of serum 25-OH vitamin D less than 20 ng/mL (1,2). The Endocrine Society went on to further define vitamin D insufficiency as a level between 21 and 29 ng/mL (2). 1. IOM (Institute of Medicine). 2010. Dietary reference    intakes for calcium and D. [place]: The    [employer]. 2. Holick MF, Binkley Twisp, Bischoff-Ferrari HA, et al.    Evaluation, treatment,  and prevention of vitamin D    deficiency: an Endocrine Society clinical practice    guideline. JCEM. 2011 Jul; 96(7):1911-30.   Vitamin B12     Status: None   Collection Time: 01/03/23  3:32 PM  Result Value Ref Range   Vitamin B-12 305 232 - 1,245 pg/mL      Assessment & Plan:  To proceed with orthopedic consult.  Consider referral to hand surgeon if she continues to have pain and swelling of her right hand. Problem List Items Addressed This Visit     Hyperlipidemia - Primary   Wound infection, posttraumatic   Left wrist sprain, sequela    Follow up as scheduled.  Total time spent: 25 minutes  Margaretann Loveless, MD  01/14/2023   This document may have been prepared by [facility] Voice Recognition software and as such may include unintentional dictation errors.

## 2023-01-15 DIAGNOSIS — S61411A Laceration without foreign body of right hand, initial encounter: Secondary | ICD-10-CM | POA: Diagnosis not present

## 2023-01-15 DIAGNOSIS — G5602 Carpal tunnel syndrome, left upper limb: Secondary | ICD-10-CM | POA: Diagnosis not present

## 2023-01-16 ENCOUNTER — Telehealth: Payer: Self-pay

## 2023-01-16 NOTE — Telephone Encounter (Signed)
Transition Care Management Follow-up Telephone Call Date of discharge and from where: 01/09/2023 [facility] How have you been since you were released from the hospital? Patient is feeling better, but hit her hand against the car seat. There was bleeding but it stopped. I advised patient to let her PCP know and if the bleeding started again to visit the urgent care or ED. Any questions or concerns? No  Items Reviewed: Did the pt receive and understand the discharge instructions provided? Yes  Medications obtained and verified? Yes  Other? No  Any new allergies since your discharge? No  Dietary orders reviewed? Yes Do you have support at home? Yes   Follow up appointments reviewed:  PCP Hospital f/u appt confirmed?  This visit was follow up from 6/23 ED visit for suture removal.  Scheduled to see  on  @ . Specialist Hospital f/u appt confirmed? No  Scheduled to see  on  @ . Are transportation arrangements needed? No  If their condition worsens, is the pt aware to call PCP or go to the Emergency Dept.? Yes Was the patient provided with contact information for the PCP's office or ED? Yes Was to pt encouraged to call back with questions or concerns? Yes  Alexis Christensen [facility]  [facility] Population Health Community Resource Care Guide   [email or web address]  ?? [phone]   Website: [email or web address]  [email or web address]

## 2023-01-18 ENCOUNTER — Other Ambulatory Visit: Payer: Self-pay

## 2023-01-18 ENCOUNTER — Telehealth: Payer: Self-pay

## 2023-01-18 DIAGNOSIS — N3941 Urge incontinence: Secondary | ICD-10-CM

## 2023-01-18 DIAGNOSIS — T83110A Breakdown (mechanical) of urinary electronic stimulator device, initial encounter: Secondary | ICD-10-CM

## 2023-01-18 NOTE — Telephone Encounter (Signed)
I spoke with Alexis Christensen. We have discussed possible surgery dates and Monday August 5th, 2024 was agreed upon by all parties. Patient given information about surgery date, what to expect pre-operatively and post operatively.  We discussed that a Pre-Admission Testing office will be calling to set up the pre-op visit that will take place prior to surgery, and that these appointments are typically done over the phone with a Pre-Admissions RN. Informed patient that our office will communicate any additional care to be provided after surgery. Patients questions or concerns were discussed during our call. Advised to call our office should there be any additional information, questions or concerns that arise. Patient verbalized understanding.

## 2023-01-18 NOTE — Progress Notes (Signed)
   [facility] Posting Form  Surgery Date: Date: 02/18/2023  Surgeon: Dr. Lorin Picket MacDiarmid,MD   Inpt ( No  )   Outpt (Yes)   Obs ( No  )   Diagnosis: N39.41 Urge Incontinence, T83.110A Malfunctioning Interstim  -CPT: [id number], [id number], [id number], [id number], [id number], [phone]  Surgery: Removal of Interstim, Stage 1 Placement of Interstim, Stage 2 Placement of Interstim with Impedance Check   Stop Anticoagulations: Yes  Cardiac/Medical/Pulmonary Clearance needed: no  *Orders entered into EPIC  Date: 01/18/23   *Case booked in [place]  Date: 01/16/2023  *Notified pt of Surgery: Date: 01/16/2023  PRE-OP UA & CX: no  *Placed into Prior Authorization Work Angela Nevin Date: 01/18/23  Assistant/laser/rep:Yes Interstim Rep Oliver Hum to be present for case.

## 2023-01-22 DIAGNOSIS — Z79899 Other long term (current) drug therapy: Secondary | ICD-10-CM | POA: Diagnosis not present

## 2023-01-22 DIAGNOSIS — G8929 Other chronic pain: Secondary | ICD-10-CM | POA: Diagnosis not present

## 2023-01-28 ENCOUNTER — Ambulatory Visit: Payer: 59 | Admitting: Urology

## 2023-01-31 ENCOUNTER — Other Ambulatory Visit (INDEPENDENT_AMBULATORY_CARE_PROVIDER_SITE_OTHER): Payer: Self-pay | Admitting: Family Medicine

## 2023-01-31 ENCOUNTER — Encounter (INDEPENDENT_AMBULATORY_CARE_PROVIDER_SITE_OTHER): Payer: Self-pay | Admitting: Family Medicine

## 2023-01-31 ENCOUNTER — Ambulatory Visit (INDEPENDENT_AMBULATORY_CARE_PROVIDER_SITE_OTHER): Payer: 59 | Admitting: Family Medicine

## 2023-01-31 VITALS — BP 102/71 | HR 83 | Temp 98.4°F | Ht 64.0 in | Wt 237.0 lb

## 2023-01-31 DIAGNOSIS — E538 Deficiency of other specified B group vitamins: Secondary | ICD-10-CM

## 2023-01-31 DIAGNOSIS — Z6841 Body Mass Index (BMI) 40.0 and over, adult: Secondary | ICD-10-CM

## 2023-01-31 DIAGNOSIS — E559 Vitamin D deficiency, unspecified: Secondary | ICD-10-CM | POA: Diagnosis not present

## 2023-01-31 DIAGNOSIS — R7303 Prediabetes: Secondary | ICD-10-CM

## 2023-01-31 DIAGNOSIS — E669 Obesity, unspecified: Secondary | ICD-10-CM | POA: Diagnosis not present

## 2023-01-31 DIAGNOSIS — Z6837 Body mass index (BMI) 37.0-37.9, adult: Secondary | ICD-10-CM

## 2023-01-31 DIAGNOSIS — E66812 Obesity, class 2: Secondary | ICD-10-CM

## 2023-01-31 MED ORDER — CYANOCOBALAMIN 500 MCG PO TABS
500.0000 ug | ORAL_TABLET | Freq: Every day | ORAL | Status: DC
Start: 2023-01-31 — End: 2023-03-14

## 2023-01-31 MED ORDER — RYBELSUS 3 MG PO TABS
3.0000 mg | ORAL_TABLET | Freq: Every day | ORAL | 0 refills | Status: DC
Start: 2023-01-31 — End: 2023-03-14

## 2023-01-31 MED ORDER — METFORMIN HCL 500 MG PO TABS: 500.0000 mg | ORAL_TABLET | Freq: Two times a day (BID) | ORAL | 0 refills | Status: DC

## 2023-01-31 MED ORDER — VITAMIN D (ERGOCALCIFEROL) 1.25 MG (50000 UNIT) PO CAPS
ORAL_CAPSULE | ORAL | 0 refills | Status: DC
Start: 2023-01-31 — End: 2023-03-14

## 2023-01-31 NOTE — Progress Notes (Signed)
Alexis Christensen, D.O.  ABFM, ABOM Specializing in Clinical Bariatric Medicine  Office located at: [address] Alex[place] Christ[place], [place]  [address]     Assessment and Plan:   Medications Discontinued During This Encounter  Medication Reason   Vitamin D, Ergocalciferol, (DRISDOL) 1.25 MG (50000 UNIT) CAPS capsule Reorder   metFORMIN (GLUCOPHAGE) 500 MG tablet Reorder   Semaglutide (RYBELSUS) 3 MG TABS Reorder   cyanocobalamin (VITAMIN B12) 500 MCG tablet Reorder     Meds ordered this encounter  Medications   metFORMIN (GLUCOPHAGE) 500 MG tablet    Sig: Take 1 tablet (500 mg total) by mouth 2 (two) times daily with a meal.    Dispense:  60 tablet    Refill:  0    30 d supply;  ** OV for RF **   Do not send RF request   Vitamin D, Ergocalciferol, (DRISDOL) 1.25 MG (50000 UNIT) CAPS capsule    Sig: 1 po q wed and 1 po q sun    Dispense:  8 capsule    Refill:  0    30 d supply;  ** OV for RF **   Do not send RF request   Semaglutide (RYBELSUS) 3 MG TABS    Sig: Take 1 tablet (3 mg total) by mouth daily.    Dispense:  30 tablet    Refill:  0   cyanocobalamin (VITAMIN B12) 500 MCG tablet    Sig: Take 1 tablet (500 mcg total) by mouth daily.     Pre-diabetes Assessment & Plan: Condition is being treated with Metformin 500 mg two times daily and Rybelsus 3 mg daily. She is tolerating both medications well and denies any N/V/D. Labs below indicate that her A1c has slightly increased by 0.1. Labs were reviewed with pt today and education provided on them and how the foods patient eats may influence these findings. All questions were answered about them.    Lab Results  Component Value Date   HGBA1C 6.1 (H) 01/03/2023   HGBA1C 6.0 (H) 10/08/2022   HGBA1C 6.0 06/13/2022   INSULIN 13.8 02/14/2022   INSULIN 6.4 08/01/2021   INSULIN 7.4 03/28/2021    Continue with Metformin and Rybelsus at current doses. Will refill both medications today.  Continue to decrease simple carbs/  sugars; increase fiber and proteins -> follow her meal plan.  Alexis Christensen will continue to work on weight loss, exercise, via their meal plan we devised to help decrease the risk of progressing to diabetes. We will recheck A1c and fasting insulin level in approximately 3 months from last check, or as deemed appropriate.    Vitamin D deficiency Assessment and Plan:  This condition is being treated with Ergocalciferol 50K IU twice weekly. She is tolerating prescribed supplement well and reports not skipping doses. Labs below indicate her Vitamin D levels have slightly improved, but are far from the recommended range of 50-80. Labs were reviewed with patient today and education provided on them. All of the patient's questions about them were answered    Lab Results  Component Value Date   VD25OH 19.1 (L) 01/03/2023   VD25OH 16.2 (L) 07/03/2022   VD25OH 19.6 (L) 02/14/2022   I recommended Alexis Christensen to discuss with her endocrinologist about why her Vitamin D levels are not increasing as anticipated,  despite her consistently taking ERGO. I discussed the importance of vitamin D to the patient's health and well-being as well as to their ability to lose weight. I reviewed possible  symptoms of low Vitamin D:  low energy, depressed mood, muscle aches, joint aches, osteoporosis etc. with patient . It has been show that administration of vitamin D supplementation leads to improved satiety and a decrease in inflammatory markers.  Hence, low Vitamin D levels may be linked to an increased risk of cardiovascular events and even increased risk of cancers- such as colon and breast. Will refill ERGO today.     B12 deficiency Assessment & Plan:  Labs below indicate that her Vitamin D levels are not within the recommended range of 500+. In the past month, pt endorses taking OTC Vitamin B12 500 mcg 50% of the time. Labs were reviewed with patient today and education provided on them. All of the patient's questions about them were  answered.    Lab Results  Component Value Date   VITAMINB12 305 01/03/2023   I encouraged pt to take her B12 supplement more consisently in order to improve these levels.  Pt advised to continue their prudent nutritional plan and focus on b12 rich foods such as lean red meats; poultry; eggs; seafood; beans, peas, and lentils; nuts and seeds; and soy products. We will continue to monitor as deemed clinically necessary.   TREATMENT PLAN FOR OBESITY: BMI 37.0-37.9, adult-current bmi 40.66 Obesity,current BMI 37.9 Assessment: Alexis Christensen is here to discuss her progress with her obesity treatment plan along with follow-up of her obesity related diagnoses. See Medical Weight Management Flowsheet for complete bioelectrical impedance results.  Condition is improving, but not optimized.  Biometric data collected today, was reviewed with patient.   Since last office visit on 01/03/23 patient's  Muscle mass has increased by 1.6 lb. Fat mass has increased by 1.2 lb. Total body water has increased by 2.6 lb.  Counseling done on how various foods will affect these numbers and how to maximize success  Total lbs lost to date: + 8 lbs  Total weight loss percentage to date: + 2.6%   Plan: Continue the Category 1 meal plan with breakfast and lunch options.   Behavioral Intervention Additional resources provided today: Healthy [employer] Evidence-based interventions for health behavior change were utilized today including the discussion of self monitoring techniques, problem-solving barriers and SMART goal setting techniques.   Regarding patient's less desirable eating habits and patterns, we employed the technique of small changes.  Pt will specifically work on: maintaining weight and increasing lean protein intake for next visit.    Recommended Physical Activity Goals  Alexis Christensen has been advised to slowly work up to 150 minutes of moderate intensity aerobic activity a week and strengthening exercises  2-3 times per week for cardiovascular health, weight loss maintenance and preservation of muscle mass.   She has agreed to Continue current level of physical activity   FOLLOW UP: Return in 2-3 wks with Dr.O. She was informed of the importance of frequent follow up visits to maximize her success with intensive lifestyle modifications for her multiple health conditions.  Subjective:   Chief complaint: Obesity Alexis Christensen is here to discuss her progress with her obesity treatment plan. She is on the Category 1 Plan with breakfast and lunch options and states she is following her eating plan approximately 50% of the time. She states she is walking 15-20 minutes 2 days per week.  Interval History:  Alexis Christensen is here for a follow up office visit. Since last OV, Deneen has been doing well. She moved into her house roughly 2 wks ago and is still in  the process of unpacking. She endorses that her sleep has been better. Reports not increasing her lean protein intake, but is working on it. Hunger and cravings have been pretty well controlled.   Pharmacotherapy for weight loss: She is currently taking  Rybelsus 3 mg daily and Metformin 500 mg twice daily   for medical weight loss.  Denies side effects.    Review of Systems:  Pertinent positives were addressed with patient today.  Reviewed by clinician on day of visit: allergies, medications, problem list, medical history, surgical history, family history, social history, and previous encounter notes.  Weight Summary and Biometrics   Weight Lost Since Last Visit: 0lb  Weight Gained Since Last Visit: 3lb    Vitals Temp: 98.4 F (36.9 C) BP: 102/71 Pulse Rate: 83 SpO2: 96 %   Anthropometric Measurements Height: 5\' 4"  (1.626 m) Weight: 237 lb (107.5 kg) BMI (Calculated): 40.66 Weight at Last Visit: 234lb Weight Lost Since Last Visit: 0lb Weight Gained Since Last Visit: 3lb Starting Weight: 229lb Total Weight Loss (lbs): 0 lb (0 kg)   Body  Composition  Body Fat %: 51.6 % Fat Mass (lbs): 122.2 lbs Muscle Mass (lbs): 109 lbs Total Body Water (lbs): 90.4 lbs Visceral Fat Rating : 16   Other Clinical Data Fasting: no Labs: no Today's Visit #: 24 Starting Date: 03/28/21   Objective:   PHYSICAL EXAM: Blood pressure 102/71, pulse 83, temperature 98.4 F (36.9 C), height 5\' 4"  (1.626 m), weight 237 lb (107.5 kg), SpO2 96%. Body mass index is 40.68 kg/m.  General: Well Developed, well nourished, and in no acute distress.  HEENT: Normocephalic, atraumatic Skin: Warm and dry, cap RF less 2 sec, good turgor Chest:  Normal excursion, shape, no gross abn Respiratory: speaking in full sentences, no conversational dyspnea NeuroM-Sk: Ambulates w/o assistance, moves * 4 Psych: A and O *3, insight good, mood-full  DIAGNOSTIC DATA REVIEWED:  BMET    Component Value Date/Time   NA 138 01/01/2023 1214   NA 141 10/08/2022 1602   NA 138 11/07/2012 0517   K 3.7 01/01/2023 1214   K 3.9 11/07/2012 0517   CL 103 01/01/2023 1214   CL 109 (H) 11/07/2012 0517   CO2 27 01/01/2023 1214   CO2 25 11/07/2012 0517   GLUCOSE 113 (H) 01/01/2023 1214   GLUCOSE 91 11/07/2012 0517   BUN 12 01/01/2023 1214   BUN 12 10/08/2022 1602   BUN 7 11/07/2012 0517   CREATININE 0.63 01/01/2023 1214   CREATININE 0.76 11/07/2012 0517   CALCIUM 8.6 (L) 01/01/2023 1214   CALCIUM 7.9 (L) 11/07/2012 0517   GFRNONAA >60 01/01/2023 1214   GFRNONAA >60 11/07/2012 0517   GFRAA >60 02/17/2020 0000   GFRAA >60 11/07/2012 0517   Lab Results  Component Value Date   HGBA1C 6.1 (H) 01/03/2023   HGBA1C 6.0 03/06/2021   Lab Results  Component Value Date   INSULIN 13.8 02/14/2022   INSULIN 7.4 03/28/2021   Lab Results  Component Value Date   TSH 15.200 (H) 10/08/2022   CBC    Component Value Date/Time   WBC 7.0 01/01/2023 1214   RBC 4.34 01/01/2023 1214   HGB 12.4 01/01/2023 1214   HGB 11.4 10/08/2022 1602   HCT 38.8 01/01/2023 1214   HCT 34.4  10/08/2022 1602   PLT 282 01/01/2023 1214   PLT 209 11/07/2012 0517   MCV 89.4 01/01/2023 1214   MCV 87 10/08/2022 1602   MCV 89 10/20/2012 1448  MCH 28.6 01/01/2023 1214   MCHC 32.0 01/01/2023 1214   RDW 12.7 01/01/2023 1214   RDW 12.8 10/08/2022 1602   RDW 13.0 10/20/2012 1448   Iron Studies    Component Value Date/Time   IRON 39 07/13/2016 1055   TIBC 408 07/13/2016 1055   FERRITIN 11 07/13/2016 1055   IRONPCTSAT 10 (L) 07/13/2016 1055   Lipid Panel     Component Value Date/Time   CHOL 196 10/08/2022 1602   TRIG 96 10/08/2022 1602   HDL 66 10/08/2022 1602   CHOLHDL 3.0 10/08/2022 1602   LDLCALC 113 (H) 10/08/2022 1602   Hepatic Function Panel     Component Value Date/Time   PROT 6.3 10/08/2022 1602   ALBUMIN 3.9 10/08/2022 1602   AST 20 10/08/2022 1602   ALT 23 10/08/2022 1602   ALKPHOS 118 10/08/2022 1602   BILITOT 0.3 10/08/2022 1602      Component Value Date/Time   TSH 15.200 (H) 10/08/2022 1602   Nutritional Lab Results  Component Value Date   VD25OH 19.1 (L) 01/03/2023   VD25OH 16.2 (L) 07/03/2022   VD25OH 19.6 (L) 02/14/2022    Attestations:   I, Special Puri , acting as a Stage manager for Thomasene Lot, DO., have compiled all relevant documentation for today's office visit on behalf of Thomasene Lot, DO, while in the presence of [employer], DO.  I have reviewed the above documentation for accuracy and completeness, and I agree with the above. Alexis Christensen, D.O.  The 21st Century Cures Act was signed into law in 2016 which includes the topic of electronic health records.  This provides immediate access to information in MyChart.  This includes consultation notes, operative notes, office notes, lab results and pathology reports.  If you have any questions about what you read please let us know at your next visit so we can discuss your concerns and take corrective action if need be.  We are right here with you.

## 2023-02-01 ENCOUNTER — Ambulatory Visit: Payer: 59 | Admitting: Cardiovascular Disease

## 2023-02-05 ENCOUNTER — Other Ambulatory Visit: Payer: 59

## 2023-02-05 DIAGNOSIS — R7303 Prediabetes: Secondary | ICD-10-CM | POA: Diagnosis not present

## 2023-02-05 DIAGNOSIS — E782 Mixed hyperlipidemia: Secondary | ICD-10-CM

## 2023-02-05 DIAGNOSIS — Z79899 Other long term (current) drug therapy: Secondary | ICD-10-CM | POA: Diagnosis not present

## 2023-02-05 DIAGNOSIS — I10 Essential (primary) hypertension: Secondary | ICD-10-CM

## 2023-02-05 DIAGNOSIS — E038 Other specified hypothyroidism: Secondary | ICD-10-CM | POA: Diagnosis not present

## 2023-02-06 LAB — CBC WITH DIFFERENTIAL
Basophils Absolute: 0 10*3/uL (ref 0.0–0.2)
Basos: 1 %
EOS (ABSOLUTE): 0.3 10*3/uL (ref 0.0–0.4)
Eos: 4 %
Hematocrit: 36 % (ref 34.0–46.6)
Hemoglobin: 11.8 g/dL (ref 11.1–15.9)
Immature Grans (Abs): 0 10*3/uL (ref 0.0–0.1)
Immature Granulocytes: 0 %
Lymphocytes Absolute: 2 10*3/uL (ref 0.7–3.1)
Lymphs: 27 %
MCH: 28.7 pg (ref 26.6–33.0)
MCHC: 32.8 g/dL (ref 31.5–35.7)
MCV: 88 fL (ref 79–97)
Monocytes Absolute: 0.5 10*3/uL (ref 0.1–0.9)
Monocytes: 7 %
Neutrophils Absolute: 4.5 10*3/uL (ref 1.4–7.0)
Neutrophils: 61 %
RBC: 4.11 x10E6/uL (ref 3.77–5.28)
RDW: 13 % (ref 11.7–15.4)
WBC: 7.3 10*3/uL (ref 3.4–10.8)

## 2023-02-06 LAB — HEMOGLOBIN A1C
Est. average glucose Bld gHb Est-mCnc: 131 mg/dL
Hgb A1c MFr Bld: 6.2 % — ABNORMAL HIGH (ref 4.8–5.6)

## 2023-02-06 LAB — CMP14+EGFR
ALT: 18 IU/L (ref 0–32)
AST: 18 IU/L (ref 0–40)
Albumin: 3.7 g/dL — ABNORMAL LOW (ref 3.8–4.9)
Alkaline Phosphatase: 126 IU/L — ABNORMAL HIGH (ref 44–121)
BUN/Creatinine Ratio: 16 (ref 9–23)
BUN: 10 mg/dL (ref 6–24)
Bilirubin Total: 0.3 mg/dL (ref 0.0–1.2)
CO2: 24 mmol/L (ref 20–29)
Calcium: 8.8 mg/dL (ref 8.7–10.2)
Chloride: 102 mmol/L (ref 96–106)
Creatinine, Ser: 0.63 mg/dL (ref 0.57–1.00)
Globulin, Total: 2.5 g/dL (ref 1.5–4.5)
Glucose: 100 mg/dL — ABNORMAL HIGH (ref 70–99)
Potassium: 4.3 mmol/L (ref 3.5–5.2)
Sodium: 139 mmol/L (ref 134–144)
Total Protein: 6.2 g/dL (ref 6.0–8.5)
eGFR: 105 mL/min/{1.73_m2} (ref >59)

## 2023-02-06 LAB — LIPID PANEL
Chol/HDL Ratio: 3.9 ratio (ref 0.0–4.4)
Cholesterol, Total: 185 mg/dL (ref 100–199)
HDL: 48 mg/dL (ref >39)
LDL Chol Calc (NIH): 97 mg/dL (ref 0–99)
Triglycerides: 235 mg/dL — ABNORMAL HIGH (ref 0–149)
VLDL Cholesterol Cal: 40 mg/dL (ref 5–40)

## 2023-02-06 LAB — TSH: TSH: 12.4 u[IU]/mL — ABNORMAL HIGH (ref 0.450–4.500)

## 2023-02-07 ENCOUNTER — Ambulatory Visit (INDEPENDENT_AMBULATORY_CARE_PROVIDER_SITE_OTHER): Payer: 59 | Admitting: Cardiology

## 2023-02-07 ENCOUNTER — Encounter: Payer: Self-pay | Admitting: Cardiology

## 2023-02-07 VITALS — BP 130/90 | HR 86 | Ht 64.0 in | Wt 252.6 lb

## 2023-02-07 DIAGNOSIS — E782 Mixed hyperlipidemia: Secondary | ICD-10-CM | POA: Diagnosis not present

## 2023-02-07 DIAGNOSIS — Z1231 Encounter for screening mammogram for malignant neoplasm of breast: Secondary | ICD-10-CM | POA: Diagnosis not present

## 2023-02-07 DIAGNOSIS — E038 Other specified hypothyroidism: Secondary | ICD-10-CM | POA: Diagnosis not present

## 2023-02-07 DIAGNOSIS — R7303 Prediabetes: Secondary | ICD-10-CM

## 2023-02-07 NOTE — Progress Notes (Signed)
Established Patient Office Visit  Subjective:  Patient ID: Alexis Christensen, female    DOB: 1970/09/10  Age: 52 y.o. MRN: [id number]  Chief Complaint  Patient presents with   Follow-up    3 mo F/U    Patient in office for 3 month follow up. Patient doing well. No acute complaints.  Due for a mammogram, order sent. Due for a pap smear.  Fasting labs. TSH followed by endocrinology. Hgb A1c trending up. Triglycerides elevated this time. Patient states she moved the beginning of June, had not been eating well. Recommend she follow a strict diabetic diet, and exercise.     No other concerns at this time.   Past Medical History:  Diagnosis Date   Acute diastolic (congestive) heart failure (HCC)    ADHD    Adopted    Anemia    Anxiety    Arthritis    Asthma    B12 deficiency    Bilateral swelling of feet and ankles    Bipolar 1 disorder (HCC)    Bipolar disorder (HCC)    Carpal tunnel syndrome 09/15/2014   Chest pain    CHF (congestive heart failure) (HCC)    Dialostic CHF   Chronic diarrhea 09/07/2015   Chronic kidney disease    H/O KIDNEY STONES   Chronic venous insufficiency 04/26/2016   Depression    DVT (deep venous thrombosis) (HCC) 2016   RIGHT LEG   Edema leg    Effusion of knee 12/30/2013   Family history of adverse reaction to anesthesia    ADOPTED   Food allergy    GERD (gastroesophageal reflux disease)    Goiter    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   Headache(784.0)    MIGRAINES   Heart burn    Heart murmur    History of blood clots    History of kidney stones    HLD (hyperlipidemia)    Hx MRSA infection    Hyperlipidemia    Hypothyroidism    Lipoma of arm 05/11/2013   Lower extremity edema    Lymphedema 04/26/2016   Other fatigue    Post traumatic stress disorder (PTSD)    Prediabetes    PVD (peripheral vascular disease) (HCC)    Seasonal allergies    Shortness of breath    Shortness of breath on exertion    Stress incontinence     Thyroid disease    Urge incontinence    Vitamin D deficiency     Past Surgical History:  Procedure Laterality Date   ABDOMINAL HYSTERECTOMY     ANKLE SURGERY     CHOLECYSTECTOMY     COLONOSCOPY WITH PROPOFOL N/A 06/24/2019   Procedure: COLONOSCOPY WITH PROPOFOL;  Surgeon: Toledo, Boykin Nearing, MD;  Location: [facility];  Service: Gastroenterology;  Laterality: N/A;   CYSTOSCOPY N/A 04/09/2016   Procedure: CYSTOSCOPY;  Surgeon: Alfredo Martinez, MD;  Location: [facility];  Service: Urology;  Laterality: N/A;   DILATION AND CURETTAGE OF UTERUS     ESOPHAGOGASTRODUODENOSCOPY (EGD) WITH PROPOFOL N/A 06/24/2019   Procedure: ESOPHAGOGASTRODUODENOSCOPY (EGD) WITH PROPOFOL;  Surgeon: Toledo, Boykin Nearing, MD;  Location: [facility];  Service: Gastroenterology;  Laterality: N/A;   INGUINAL HERNIA REPAIR Bilateral 04/18/2017   Procedure: LAPAROSCOPIC BILATERAL INGUINAL HERNIA REPAIR;  Surgeon: Leafy Ro, MD;  Location: [facility];  Service: General;  Laterality: Bilateral;   INTERSTIM IMPLANT PLACEMENT N/A 11/26/2016   Procedure: Leane Platt IMPLANT FIRST STAGE;  Surgeon: Alfredo Martinez, MD;  Location: [facility];  Service: Urology;  Laterality: N/A;   INTERSTIM IMPLANT PLACEMENT N/A 11/26/2016   Procedure: Leane Platt IMPLANT SECOND STAGE;  Surgeon: Alfredo Martinez, MD;  Location: [facility];  Service: Urology;  Laterality: N/A;   JOINT REPLACEMENT Right    knee   KNEE ARTHROSCOPY Bilateral    KNEE ARTHROSCOPY WITH LATERAL RELEASE Left 10/18/2015   Procedure: KNEE ARTHROSCOPY LATERAL AND PARTIAL SYNOVECTOMY;  Surgeon: Kennedy Bucker, MD;  Location: [facility];  Service: Orthopedics;  Laterality: Left;   Lymph Node removal  2015   Neck   PUBOVAGINAL SLING N/A 04/09/2016   Procedure: PUBO-VAGINAL SLING/ RETROPUBIC SLING;  Surgeon: Alfredo Martinez, MD;  Location: [facility];  Service: Urology;  Laterality: N/A;   REPLACEMENT TOTAL KNEE Right    SHOULDER SURGERY Right 2014   TUBAL LIGATION     WRIST SURGERY  Right    metal plate    Social History   Socioeconomic History   Marital status: Divorced    Spouse name: Not on file   Number of children: Not on file   Years of education: Not on file   Highest education level: Not on file  Occupational History   Occupation: Customer Service    Employer: [employer]  Tobacco Use   Smoking status: Former    Current packs/day: 0.00    Average packs/day: 0.5 packs/day for 11.0 years (5.5 ttl pk-yrs)    Types: Cigarettes    Start date: 07/16/2008    Quit date: 07/16/2019    Years since quitting: 3.5    Passive exposure: Past   Smokeless tobacco: Former   Tobacco comments:    STRESS RELATED  Vaping Use   Vaping status: Never Used  Substance and Sexual Activity   Alcohol use: Not Currently    Comment: Hisotry of ETOH abouse ASB patient   Drug use: Yes    Types: Other-see comments    Comment: Pt listed history of abuse of "prescription drugs"   Sexual activity: Not Currently  Other Topics Concern   Not on file  Social History Narrative   Not on file   Social Determinants of Health   Financial Resource Strain: Not on file  Food Insecurity: Not on file  Transportation Needs: Not on file  Physical Activity: Not on file  Stress: Not on file  Social Connections: Not on file  Intimate Partner Violence: Not on file    Family History  Adopted: Yes  Family history unknown: Yes    Allergies  Allergen Reactions   Geodon [Ziprasidone Hydrochloride] Other (See Comments)    Numbness ,sob, headaches, blurred vision   Sulfacetamide Sodium Hives   Ziprasidone Anaphylaxis    Other reaction(s): Other (See Comments) Numbness ,sob, headaches, blurred vision  Other reaction(s): Other (See Comments), Unknown Numbness ,sob, headaches, blurred vision Numbness ,sob, headaches, blurred vision  Other reaction(s): Other (See Comments), Unknown Numbness ,sob, headaches, blurred vision Numbness ,sob, headaches, blurred vision   Numbness ,sob,  headaches, blurred vision Other reaction(s): Other (See Comments), Unknown Numbness ,sob, headaches, blurred vision Numbness ,sob, headaches, blurred vision Other reaction(s): Other (See Comments), Unknown Numbness ,sob, headaches, blurred vision Numbness ,sob, headaches, blurred vision   Ziprasidone Hcl Anaphylaxis and Other (See Comments)    Other reaction(s): Other (See Comments), Unknown Numbness ,sob, headaches, blurred vision Numbness ,sob, headaches, blurred vision   Ace Inhibitors Rash   Erythromycin Rash and Hives   Hydromorphone Rash   Lamictal [Lamotrigine] Rash    Other reaction(s): Unknown   Strawberry (Diagnostic) Rash  Sulfa Antibiotics Hives and Rash    Other reaction(s): Unknown Other reaction(s): Unknown Other reaction(s): Unknown    Review of Systems  Constitutional: Negative.   HENT: Negative.    Eyes: Negative.   Respiratory: Negative.  Negative for shortness of breath.   Cardiovascular: Negative.  Negative for chest pain.  Gastrointestinal: Negative.  Negative for abdominal pain, constipation and diarrhea.  Genitourinary: Negative.   Musculoskeletal:  Negative for joint pain and myalgias.  Skin: Negative.   Neurological: Negative.  Negative for dizziness and headaches.  Endo/Heme/Allergies: Negative.   All other systems reviewed and are negative.      Objective:   BP (!) 130/90   Pulse 86   Ht 5\' 4"  (1.626 m)   Wt 252 lb 9.6 oz (114.6 kg)   SpO2 97%   BMI 43.36 kg/m   Vitals:   02/07/23 1346  BP: (!) 130/90  Pulse: 86  Height: 5\' 4"  (1.626 m)  Weight: 252 lb 9.6 oz (114.6 kg)  SpO2: 97%  BMI (Calculated): 43.34    Physical Exam Vitals and nursing note reviewed.  Constitutional:      Appearance: Normal appearance. She is normal weight.  HENT:     Head: Normocephalic and atraumatic.     Nose: Nose normal.     Mouth/Throat:     Mouth: Mucous membranes are moist.  Eyes:     Extraocular Movements: Extraocular movements intact.      Conjunctiva/sclera: Conjunctivae normal.     Pupils: Pupils are equal, round, and reactive to light.  Cardiovascular:     Rate and Rhythm: Normal rate and regular rhythm.     Pulses: Normal pulses.     Heart sounds: Normal heart sounds.  Pulmonary:     Effort: Pulmonary effort is normal.     Breath sounds: Normal breath sounds.  Abdominal:     General: Abdomen is flat. Bowel sounds are normal.     Palpations: Abdomen is soft.  Musculoskeletal:        General: Normal range of motion.     Cervical back: Normal range of motion.  Skin:    General: Skin is warm and dry.  Neurological:     General: No focal deficit present.     Mental Status: She is alert and oriented to person, place, and time.  Psychiatric:        Mood and Affect: Mood normal.        Behavior: Behavior normal.        Thought Content: Thought content normal.        Judgment: Judgment normal.      No results found for any visits on 02/07/23.  Recent Results (from the past 2160 hour(s))  POCT Urinalysis Dipstick (16109)     Status: Abnormal   Collection Time: 11/26/22  3:42 PM  Result Value Ref Range   Color, UA yellow    Clarity, UA turbid    Glucose, UA Negative Negative   Bilirubin, UA negative    Ketones, UA negative    Spec Grav, UA >=1.030 (A) 1.010 - 1.025   Blood, UA negative    pH, UA 6.0 5.0 - 8.0   Protein, UA Negative Negative   Urobilinogen, UA 0.2 0.2 or 1.0 E.U./dL   Nitrite, UA negative    Leukocytes, UA Small (1+) (A) Negative   Appearance turbid    Odor none   POCT Urinalysis Dipstick (60454)     Status: Abnormal   Collection Time: 12/07/22 10:28  AM  Result Value Ref Range   Color, UA     Clarity, UA     Glucose, UA Negative Negative   Bilirubin, UA Negative    Ketones, UA Negative    Spec Grav, UA 1.020 1.010 - 1.025   Blood, UA Negative    pH, UA 6.0 5.0 - 8.0   Protein, UA Negative Negative   Urobilinogen, UA 0.2 0.2 or 1.0 E.U./dL   Nitrite, UA Negative    Leukocytes, UA  Moderate (2+) (A) Negative   Appearance     Odor    Urinalysis, Complete     Status: Abnormal   Collection Time: 12/17/22  8:29 AM  Result Value Ref Range   Specific Gravity, UA >1.030 (H) 1.005 - 1.030   pH, UA 5.5 5.0 - 7.5   Color, UA Yellow Yellow   Appearance Ur Hazy (A) Clear   Leukocytes,UA 1+ (A) Negative   Protein,UA Negative Negative/Trace   Glucose, UA Negative Negative   Ketones, UA Negative Negative   RBC, UA Trace (A) Negative   Bilirubin, UA Negative Negative   Urobilinogen, Ur 0.2 0.2 - 1.0 mg/dL   Nitrite, UA Negative Negative   Microscopic Examination See below:   Microscopic Examination     Status: Abnormal   Collection Time: 12/17/22  8:29 AM   Urine  Result Value Ref Range   WBC, UA >30 (A) 0 - 5 /hpf   RBC, Urine 11-30 (A) 0 - 2 /hpf   Epithelial Cells (non renal) 0-10 0 - 10 /hpf   Mucus, UA Present (A) Not Estab.   Bacteria, UA Many (A) None seen/Few  Clostridium Difficile by PCR     Status: None   Collection Time: 12/17/22  8:30 AM   Specimen: STOOL   ST  Result Value Ref Range   Toxigenic C. Difficile by PCR Negative Negative  CULTURE, URINE COMPREHENSIVE     Status: Abnormal   Collection Time: 12/17/22  8:49 AM   Specimen: Urine   UR  Result Value Ref Range   Urine Culture, Comprehensive Final report (A)    Organism ID, Bacteria Escherichia coli (A)     Comment: Cefazolin <=4 ug/mL Cefazolin with an MIC <=16 predicts susceptibility to the oral agents cefaclor, cefdinir, cefpodoxime, cefprozil, cefuroxime, cephalexin, and loracarbef when used for therapy of uncomplicated urinary tract infections due to E. coli, Klebsiella pneumoniae, and Proteus mirabilis. Greater than 100,000 colony forming units per mL    Organism ID, Bacteria Comment     Comment: Mixed urogenital flora 50,000-100,000 colony forming units per mL    ANTIMICROBIAL SUSCEPTIBILITY Comment     Comment:       ** S = Susceptible; I = Intermediate; R = Resistant **                     P = Positive; N = Negative             MICS are expressed in micrograms per mL    Antibiotic                 RSLT#1    RSLT#2    RSLT#3    RSLT#4 Amoxicillin/Clavulanic Acid    S Ampicillin                     S Cefepime  S Ceftriaxone                    S Cefuroxime                     S Ciprofloxacin                  S Ertapenem                      S Gentamicin                     S Imipenem                       S Levofloxacin                   S Meropenem                      S Nitrofurantoin                 S Piperacillin/Tazobactam        S Tetracycline                   S Tobramycin                     S Trimethoprim/Sulfa             S   CBC     Status: None   Collection Time: 01/01/23 12:14 PM  Result Value Ref Range   WBC 7.0 4.0 - 10.5 K/uL   RBC 4.34 3.87 - 5.11 MIL/uL   Hemoglobin 12.4 12.0 - 15.0 g/dL   HCT 02.7 25.3 - 66.4 %   MCV 89.4 80.0 - 100.0 fL   MCH 28.6 26.0 - 34.0 pg   MCHC 32.0 30.0 - 36.0 g/dL   RDW 40.3 47.4 - 25.9 %   Platelets 282 150 - 400 K/uL   nRBC 0.0 0.0 - 0.2 %    Comment: Performed at [facility], [address] [place], [place] [address]  Basic metabolic panel     Status: Abnormal   Collection Time: 01/01/23 12:14 PM  Result Value Ref Range   Sodium 138 135 - 145 mmol/L   Potassium 3.7 3.5 - 5.1 mmol/L   Chloride 103 98 - 111 mmol/L   CO2 27 22 - 32 mmol/L   Glucose, Bld 113 (H) 70 - 99 mg/dL    Comment: Glucose reference range applies only to samples taken after fasting for at least 8 hours.   BUN 12 6 - 20 mg/dL   Creatinine, Ser 5.64 0.44 - 1.00 mg/dL   Calcium 8.6 (L) 8.9 - 10.3 mg/dL   GFR, Estimated >33 >29 mL/min    Comment: (NOTE) Calculated using the CKD-EPI Creatinine Equation (2021)    Anion gap 8 5 - 15    Comment: Performed at [facility], [address] [place], [place] [address]  Hemoglobin A1c     Status: Abnormal   Collection Time: 01/03/23  3:32 PM   Result Value Ref Range   Hgb A1c MFr Bld 6.1 (H) 4.8 - 5.6 %    Comment:          Prediabetes: 5.7 - 6.4          Diabetes: >6.4  Glycemic control for adults with diabetes: <7.0    Est. average glucose Bld gHb Est-mCnc 128 mg/dL  VITAMIN D 25 Hydroxy (Vit-D Deficiency, Fractures)     Status: Abnormal   Collection Time: 01/03/23  3:32 PM  Result Value Ref Range   Vit D, 25-Hydroxy 19.1 (L) 30.0 - 100.0 ng/mL    Comment: Vitamin D deficiency has been defined by the Institute of Medicine and an Endocrine Society practice guideline as a level of serum 25-OH vitamin D less than 20 ng/mL (1,2). The Endocrine Society went on to further define vitamin D insufficiency as a level between 21 and 29 ng/mL (2). 1. IOM (Institute of Medicine). 2010. Dietary reference    intakes for calcium and D. [place]: The    [employer]. 2. Holick MF, Binkley [place], Bischoff-Ferrari HA, et al.    Evaluation, treatment, and prevention of vitamin D    deficiency: an Endocrine Society clinical practice    guideline. JCEM. 2011 Jul; 96(7):1911-30.   Vitamin B12     Status: None   Collection Time: 01/03/23  3:32 PM  Result Value Ref Range   Vitamin B-12 305 232 - 1,245 pg/mL  Hemoglobin A1c     Status: Abnormal   Collection Time: 02/05/23 11:27 AM  Result Value Ref Range   Hgb A1c MFr Bld 6.2 (H) 4.8 - 5.6 %    Comment:          Prediabetes: 5.7 - 6.4          Diabetes: >6.4          Glycemic control for adults with diabetes: <7.0    Est. average glucose Bld gHb Est-mCnc 131 mg/dL  TSH     Status: Abnormal   Collection Time: 02/05/23 11:27 AM  Result Value Ref Range   TSH 12.400 (H) 0.450 - 4.500 uIU/mL  CMP14+EGFR     Status: Abnormal   Collection Time: 02/05/23 11:27 AM  Result Value Ref Range   Glucose 100 (H) 70 - 99 mg/dL   BUN 10 6 - 24 mg/dL   Creatinine, Ser 1.61 0.57 - 1.00 mg/dL   eGFR 096 >04 VW/UJW/1.19   BUN/Creatinine Ratio 16 9 - 23   Sodium 139 134 - 144  mmol/L   Potassium 4.3 3.5 - 5.2 mmol/L   Chloride 102 96 - 106 mmol/L   CO2 24 20 - 29 mmol/L   Calcium 8.8 8.7 - 10.2 mg/dL   Total Protein 6.2 6.0 - 8.5 g/dL   Albumin 3.7 (L) 3.8 - 4.9 g/dL   Globulin, Total 2.5 1.5 - 4.5 g/dL   Bilirubin Total 0.3 0.0 - 1.2 mg/dL   Alkaline Phosphatase 126 (H) 44 - 121 IU/L   AST 18 0 - 40 IU/L   ALT 18 0 - 32 IU/L  Lipid panel     Status: Abnormal   Collection Time: 02/05/23 11:27 AM  Result Value Ref Range   Cholesterol, Total 185 100 - 199 mg/dL   Triglycerides 147 (H) 0 - 149 mg/dL   HDL 48 >82 mg/dL   VLDL Cholesterol Cal 40 5 - 40 mg/dL   LDL Chol Calc (NIH) 97 0 - 99 mg/dL   Chol/HDL Ratio 3.9 0.0 - 4.4 ratio    Comment:                                   T. Chol/HDL Ratio  Men  Women                               1/2 Avg.Risk  3.4    3.3                                   Avg.Risk  5.0    4.4                                2X Avg.Risk  9.6    7.1                                3X Avg.Risk 23.4   11.0   CBC With Differential     Status: None   Collection Time: 02/05/23 11:27 AM  Result Value Ref Range   WBC 7.3 3.4 - 10.8 x10E3/uL   RBC 4.11 3.77 - 5.28 x10E6/uL   Hemoglobin 11.8 11.1 - 15.9 g/dL   Hematocrit 82.9 56.2 - 46.6 %   MCV 88 79 - 97 fL   MCH 28.7 26.6 - 33.0 pg   MCHC 32.8 31.5 - 35.7 g/dL   RDW 13.0 86.5 - 78.4 %   Neutrophils 61 Not Estab. %   Lymphs 27 Not Estab. %   Monocytes 7 Not Estab. %   Eos 4 Not Estab. %   Basos 1 Not Estab. %   Neutrophils Absolute 4.5 1.4 - 7.0 x10E3/uL   Lymphocytes Absolute 2.0 0.7 - 3.1 x10E3/uL   Monocytes Absolute 0.5 0.1 - 0.9 x10E3/uL   EOS (ABSOLUTE) 0.3 0.0 - 0.4 x10E3/uL   Basophils Absolute 0.0 0.0 - 0.2 x10E3/uL   Immature Granulocytes 0 Not Estab. %   Immature Grans (Abs) 0.0 0.0 - 0.1 x10E3/uL    Comment: **Effective February 11, 2023, profile [id number] CBC/Differential**   (No Platelet) will be made non-orderable. [facility]  Offers:   [id number] CBC With Differential/Platelet       Assessment & Plan:  Mammogram order sent. Pap smear at next visit with CB. Follow diabetic diet. Repeat fasting labs in 3 months.   Problem List Items Addressed This Visit       Endocrine   Hypothyroidism   Relevant Orders   CBC With Diff/Platelet   TSH     Other   Pre-diabetes   Relevant Orders   CBC With Diff/Platelet   CMP14+EGFR   Hemoglobin A1c   Hyperlipidemia   Relevant Orders   CBC With Diff/Platelet   Lipid panel   CMP14+EGFR   Other Visit Diagnoses     Encounter for screening mammogram for malignant neoplasm of breast    -  Primary   Relevant Orders   MM 3D SCREENING MAMMOGRAM BILATERAL BREAST       Return in about 4 months (around 06/10/2023) for with fasting labs prior.   Total time spent: 25 minutes  [employer], NP  02/07/2023   This document may have been prepared by Dragon Voice Recognition software and as such may include unintentional dictation errors.

## 2023-02-11 ENCOUNTER — Encounter: Payer: Self-pay | Admitting: Urology

## 2023-02-11 ENCOUNTER — Encounter
Admission: RE | Admit: 2023-02-11 | Discharge: 2023-02-11 | Disposition: A | Discharge status: Home / Self Care | Payer: 59 | Source: Ambulatory Visit | Attending: Urology | Admitting: Urology

## 2023-02-11 VITALS — Ht 64.0 in | Wt 230.0 lb

## 2023-02-11 DIAGNOSIS — M722 Plantar fascial fibromatosis: Secondary | ICD-10-CM

## 2023-02-11 DIAGNOSIS — R7303 Prediabetes: Secondary | ICD-10-CM

## 2023-02-11 DIAGNOSIS — E782 Mixed hyperlipidemia: Secondary | ICD-10-CM

## 2023-02-11 HISTORY — DX: Non-pressure chronic ulcer of unspecified part of unspecified lower leg with unspecified severity: L97.909

## 2023-02-11 HISTORY — DX: Pain in right hand: M79.642

## 2023-02-11 HISTORY — DX: Low back pain, unspecified: M54.50

## 2023-02-11 HISTORY — DX: Varicose veins of unspecified lower extremity with ulcer of unspecified site: I83.009

## 2023-02-11 HISTORY — DX: Other chronic pain: G89.29

## 2023-02-11 HISTORY — DX: Migraine with aura, not intractable, without status migrainosus: G43.109

## 2023-02-11 HISTORY — DX: Plantar fascial fibromatosis: M72.2

## 2023-02-11 HISTORY — DX: Effusion, unspecified knee: M25.469

## 2023-02-11 HISTORY — DX: Other specified abnormal immunological findings in serum: R76.8

## 2023-02-11 HISTORY — DX: Pain in unspecified knee: M25.569

## 2023-02-11 HISTORY — DX: Prediabetes: R73.03

## 2023-02-11 HISTORY — DX: Venous insufficiency (chronic) (peripheral): I87.2

## 2023-02-11 HISTORY — DX: Other specified abnormal immunological findings in serum: R76.89

## 2023-02-11 HISTORY — DX: Pain in right hand: M79.641

## 2023-02-11 NOTE — Patient Instructions (Addendum)
Your procedure is scheduled on: Monday August 5  Report to the Registration Desk on the 1st floor of the [facility] To find out your arrival time, please call [phone] between 1PM - 3PM on:  Friday August 2   If your arrival time is 6:00 am, do not arrive before that time as the [facility] entrance doors do not open until 6:00 am.  REMEMBER: Instructions that are not followed completely may result in serious medical risk, up to and including death; or upon the discretion of your surgeon and anesthesiologist your surgery may need to be rescheduled.  Do not eat or drink after midnight the night before surgery.  No gum chewing or hard candies.  One week prior to surgery:Starting Monday July 29  Stop Anti-inflammatories (NSAIDS) such as Advil, Aleve, Ibuprofen, Motrin, Naproxen, Naprosyn and Aspirin based products such as Excedrin, Goody's Powder, BC Powder. Stop ANY OVER THE COUNTER supplements until after surgery.  You may however, continue to take Tylenol if needed for pain up until the day of surgery.  Continue taking all prescribed medications with the exception of the following:  metFORMIN (GLUCOPHAGE hold 2 days prior to surgery, last dose Friday August 2  aspirin and meloxicam hold until after surgery Semaglutide (Rybelsus) hold for 24 hours before surgery. Last day to take is Saturday August 3  TAKE ONLY THESE MEDICATIONS THE MORNING OF SURGERY WITH A SIP OF WATER:  esomeprazole (NEXIUM)  levothyroxine (SYNTHROID)  liothyronine (CYTOMEL)  DULoxetine (CYMBALTA)  Budeson-Glycopyrrol-Formoterol (BREZTRI AEROSPHERE)  albuterol (VENTOLIN HFA)   Use inhalers on the day of surgery and bring albuterol inhaler to the hospital.  No Alcohol for 24 hours before or after surgery.  No Smoking including e-cigarettes for 24 hours before surgery.  No chewable tobacco products for at least 6 hours before surgery.  No nicotine patches on the day of surgery.  Do not use any  "recreational" drugs for at least a week (preferably 2 weeks) before your surgery.  Please be advised that the combination of cocaine and anesthesia may have negative outcomes, up to and including death. If you test positive for cocaine, your surgery will be cancelled.  On the morning of surgery brush your teeth with toothpaste and water, you may rinse your mouth with mouthwash if you wish. Do not swallow any toothpaste or mouthwash.  Use CHG Soap as directed on instruction sheet.  Do not wear jewelry, make-up, hairpins, clips or nail polish.  Do not wear lotions, powders, or perfumes.   Do not shave body hair from the neck down 48 hours before surgery.  Contact lenses, hearing aids and dentures may not be worn into surgery.  Do not bring valuables to the hospital. [facility] is not responsible for any missing/lost belongings or valuables.   Notify your doctor if there is any change in your medical condition (cold, fever, infection).  Wear comfortable clothing (specific to your surgery type) to the hospital.  After surgery, you can help prevent lung complications by doing breathing exercises.  Take deep breaths and cough every 1-2 hours. Your doctor may order a device called an Incentive Spirometer to help you take deep breaths. When coughing or sneezing, hold a pillow firmly against your incision with both hands. This is called "splinting." Doing this helps protect your incision. It also decreases belly discomfort.  If you are being discharged the day of surgery, you will not be allowed to drive home. You will need a responsible individual to drive you  home and stay with you for 24 hours after surgery.   If you are taking public transportation, you will need to have a responsible individual with you.  Please call the Pre-admissions Testing Dept. at [phone] if you have any questions about these instructions.  Surgery Visitation Policy:  Patients having surgery or a  procedure may have two visitors.  Children under the age of 57 must have an adult with them who is not the patient.      Preparing for Surgery with CHLORHEXIDINE GLUCONATE (CHG) Soap  Chlorhexidine Gluconate (CHG) Soap  o An antiseptic cleaner that kills germs and bonds with the skin to continue killing germs even after washing  o Used for showering the night before surgery and morning of surgery  Before surgery, you can play an important role by reducing the number of germs on your skin.  CHG (Chlorhexidine gluconate) soap is an antiseptic cleanser which kills germs and bonds with the skin to continue killing germs even after washing.  Please do not use if you have an allergy to CHG or antibacterial soaps. If your skin becomes reddened/irritated stop using the CHG.  1. Shower the NIGHT BEFORE SURGERY and the MORNING OF SURGERY with CHG soap.  2. If you choose to wash your hair, wash your hair first as usual with your normal shampoo.  3. After shampooing, rinse your hair and body thoroughly to remove the shampoo.  4. Use CHG as you would any other liquid soap. You can apply CHG directly to the skin and wash gently with a scrungie or a clean washcloth.  5. Apply the CHG soap to your body only from the neck down. Do not use on open wounds or open sores. Avoid contact with your eyes, ears, mouth, and genitals (private parts). Wash face and genitals (private parts) with your normal soap.  6. Wash thoroughly, paying special attention to the area where your surgery will be performed.  7. Thoroughly rinse your body with warm water.  8. Do not shower/wash with your normal soap after using and rinsing off the CHG soap.  9. Pat yourself dry with a clean towel.  10. Wear clean pajamas to bed the night before surgery.  12. Place clean sheets on your bed the night of your first shower and do not sleep with pets.  13. Shower again with the CHG soap on the day of surgery prior to arriving  at the hospital.  14. Do not apply any deodorants/lotions/powders.  15. Please wear clean clothes to the hospital.

## 2023-02-12 ENCOUNTER — Ambulatory Visit (INDEPENDENT_AMBULATORY_CARE_PROVIDER_SITE_OTHER): Payer: 59 | Admitting: Cardiology

## 2023-02-12 ENCOUNTER — Encounter: Payer: Self-pay | Admitting: Cardiology

## 2023-02-12 VITALS — BP 120/85 | HR 88 | Ht 64.0 in | Wt 247.4 lb

## 2023-02-12 DIAGNOSIS — J029 Acute pharyngitis, unspecified: Secondary | ICD-10-CM | POA: Insufficient documentation

## 2023-02-12 DIAGNOSIS — R051 Acute cough: Secondary | ICD-10-CM | POA: Insufficient documentation

## 2023-02-12 HISTORY — DX: Acute cough: R05.1

## 2023-02-12 HISTORY — DX: Acute pharyngitis, unspecified: J02.9

## 2023-02-12 MED ORDER — DOXYCYCLINE HYCLATE 100 MG PO TABS: 100.0000 mg | ORAL_TABLET | Freq: Two times a day (BID) | ORAL | 0 refills | Status: AC

## 2023-02-12 MED ORDER — PREDNISONE 20 MG PO TABS: ORAL_TABLET | ORAL | 0 refills | Status: AC

## 2023-02-12 MED ORDER — FLUTICASONE PROPIONATE 50 MCG/ACT NA SUSP: 1.0000 | Freq: Every day | NASAL | 11 refills | Status: DC

## 2023-02-12 MED ORDER — BENZONATATE 100 MG PO CAPS: 100.0000 mg | ORAL_CAPSULE | Freq: Three times a day (TID) | ORAL | 1 refills | Status: DC | PRN

## 2023-02-12 NOTE — Progress Notes (Signed)
Established Patient Office Visit  Subjective:  Patient ID: Alexis Christensen, female    DOB: 09/29/1970  Age: 52 y.o. MRN: [id number]  Chief Complaint  Patient presents with   Acute Visit    Coughing, difficulty breathing    Patient in office complaining of a cough. Started 3 days ago. States she coughs then becomes short of breath. Has been using her inhalers with no relief. Continue singular, inhaler use. Add Mucinex. Chest xray today. Prednisone, tessalon, Flonase, and Doxy sent to the pharmacy.   Cough This is a new problem. The current episode started in the past 7 days. The problem has been gradually worsening. The problem occurs every few minutes. The cough is Productive of sputum. Associated symptoms include headaches, nasal congestion, a sore throat and shortness of breath. Pertinent negatives include no chest pain, chills, ear congestion, ear pain, fever, myalgias, postnasal drip or rhinorrhea. She has tried a beta-agonist inhaler, ipratropium inhaler, OTC cough suppressant and steroid inhaler for the symptoms. The treatment provided no relief.    No other concerns at this time.   Past Medical History:  Diagnosis Date   Acute deep vein thrombosis (DVT) of right lower extremity (HCC) 2016   ADHD    Adopted    ANA positive    Anemia    Anxiety    Arthritis    Asthma    B12 deficiency    Bilateral hand pain    Bilateral swelling of feet and ankles    Bipolar 1 disorder (HCC)    Carpal tunnel syndrome of left wrist    Chest pain    CHF (congestive heart failure) (HCC)    Dialostic CHF   Chronic diarrhea 09/07/2015   Chronic left shoulder pain    Chronic venous insufficiency 04/26/2016   Clostridioides difficile infection 04/06/2019   Depression    Edema leg    GERD (gastroesophageal reflux disease)    Heart murmur    History of 2019 novel coronavirus disease (COVID-19) 09/09/2020   a.) PCR testing (+): 09/09/2020, 01/12/2021, 07/17/2021   History of kidney stones     HLD (hyperlipidemia)    Hx MRSA infection    Hypothyroidism    Influenza A 10/01/2022   Knee effusion    Knee pain    Lipoma of arm 05/11/2013   Lower extremity edema    Lumbago    Lymphedema 04/26/2016   Migraine with aura    Multinodular goiter    Nephrolithiasis    Plantar fascial fibromatosis    Post traumatic stress disorder (PTSD)    Pre-diabetes    PVD (peripheral vascular disease) (HCC)    Seasonal allergies    Shortness of breath on exertion    Stress incontinence    Urge incontinence    Venous insufficiency    Venous ulcer (HCC)    Vitamin D deficiency     Past Surgical History:  Procedure Laterality Date   ABDOMINAL HYSTERECTOMY     ANKLE SURGERY     CHOLECYSTECTOMY     COLONOSCOPY WITH PROPOFOL N/A 06/24/2019   Procedure: COLONOSCOPY WITH PROPOFOL;  Surgeon: Toledo, Boykin Nearing, MD;  Location: [facility];  Service: Gastroenterology;  Laterality: N/A;   CYSTOSCOPY N/A 04/09/2016   Procedure: CYSTOSCOPY;  Surgeon: Alfredo Martinez, MD;  Location: [facility];  Service: Urology;  Laterality: N/A;   DILATION AND CURETTAGE OF UTERUS     ESOPHAGOGASTRODUODENOSCOPY (EGD) WITH PROPOFOL N/A 06/24/2019   Procedure: ESOPHAGOGASTRODUODENOSCOPY (EGD) WITH PROPOFOL;  Surgeon: Conetoe, [place]  K, MD;  Location: [facility];  Service: Gastroenterology;  Laterality: N/A;   INGUINAL HERNIA REPAIR Bilateral 04/18/2017   Procedure: LAPAROSCOPIC BILATERAL INGUINAL HERNIA REPAIR;  Surgeon: Leafy Ro, MD;  Location: [facility];  Service: General;  Laterality: Bilateral;   INTERSTIM IMPLANT PLACEMENT N/A 11/26/2016   Procedure: Leane Platt IMPLANT FIRST STAGE;  Surgeon: Alfredo Martinez, MD;  Location: [facility];  Service: Urology;  Laterality: N/A;   INTERSTIM IMPLANT PLACEMENT N/A 11/26/2016   Procedure: Leane Platt IMPLANT SECOND STAGE;  Surgeon: Alfredo Martinez, MD;  Location: [facility];  Service: Urology;  Laterality: N/A;   JOINT REPLACEMENT Right    knee   KNEE ARTHROSCOPY  Bilateral    KNEE ARTHROSCOPY WITH LATERAL RELEASE Left 10/18/2015   Procedure: KNEE ARTHROSCOPY LATERAL AND PARTIAL SYNOVECTOMY;  Surgeon: Kennedy Bucker, MD;  Location: [facility];  Service: Orthopedics;  Laterality: Left;   Lymph Node removal  2015   Neck   PUBOVAGINAL SLING N/A 04/09/2016   Procedure: PUBO-VAGINAL SLING/ RETROPUBIC SLING;  Surgeon: Alfredo Martinez, MD;  Location: [facility];  Service: Urology;  Laterality: N/A;   REPLACEMENT TOTAL KNEE Right    SHOULDER SURGERY Right 2014   TUBAL LIGATION     WRIST SURGERY Right    metal plate    Social History   Socioeconomic History   Marital status: Divorced    Spouse name: Not on file   Number of children: Not on file   Years of education: Not on file   Highest education level: Not on file  Occupational History   Occupation: Customer Service    Employer: [employer]  Tobacco Use   Smoking status: Former    Current packs/day: 0.00    Average packs/day: 0.5 packs/day for 11.0 years (5.5 ttl pk-yrs)    Types: Cigarettes    Start date: 07/16/2008    Quit date: 07/16/2019    Years since quitting: 3.5    Passive exposure: Past   Smokeless tobacco: Former   Tobacco comments:    STRESS RELATED  Vaping Use   Vaping status: Never Used  Substance and Sexual Activity   Alcohol use: Not Currently    Comment: Hisotry of ETOH abouse ASB patient   Drug use: Not Currently    Comment: Pt listed history of abuse of "prescription drugs"   Sexual activity: Not Currently  Other Topics Concern   Not on file  Social History Narrative   Not on file   Social Determinants of Health   Financial Resource Strain: Not on file  Food Insecurity: Not on file  Transportation Needs: Not on file  Physical Activity: Not on file  Stress: Not on file  Social Connections: Not on file  Intimate Partner Violence: Not on file    Family History  Adopted: Yes  Family history unknown: Yes    Allergies  Allergen Reactions   Geodon [Ziprasidone  Hydrochloride] Other (See Comments)    Numbness ,sob, headaches, blurred vision   Sulfacetamide Sodium Hives   Ziprasidone Anaphylaxis    Other reaction(s): Other (See Comments) Numbness ,sob, headaches, blurred vision  Other reaction(s): Other (See Comments), Unknown Numbness ,sob, headaches, blurred vision Numbness ,sob, headaches, blurred vision  Other reaction(s): Other (See Comments), Unknown Numbness ,sob, headaches, blurred vision Numbness ,sob, headaches, blurred vision   Numbness ,sob, headaches, blurred vision Other reaction(s): Other (See Comments), Unknown Numbness ,sob, headaches, blurred vision Numbness ,sob, headaches, blurred vision Other reaction(s): Other (See Comments), Unknown Numbness ,sob, headaches, blurred vision Numbness ,sob, headaches, blurred  vision   Ziprasidone Hcl Anaphylaxis and Other (See Comments)    Other reaction(s): Other (See Comments), Unknown Numbness ,sob, headaches, blurred vision Numbness ,sob, headaches, blurred vision   Ace Inhibitors Rash   Erythromycin Rash and Hives   Hydromorphone Rash   Lamictal [Lamotrigine] Rash    Other reaction(s): Unknown   Strawberry (Diagnostic) Rash   Sulfa Antibiotics Hives and Rash    Other reaction(s): Unknown Other reaction(s): Unknown Other reaction(s): Unknown    Review of Systems  Constitutional: Negative.  Negative for chills and fever.  HENT:  Positive for sore throat. Negative for ear pain, postnasal drip and rhinorrhea.   Eyes: Negative.   Respiratory:  Positive for cough and shortness of breath.   Cardiovascular: Negative.  Negative for chest pain.  Gastrointestinal: Negative.  Negative for abdominal pain, constipation and diarrhea.  Genitourinary: Negative.   Musculoskeletal:  Negative for joint pain and myalgias.  Skin: Negative.   Neurological:  Positive for headaches. Negative for dizziness.  Endo/Heme/Allergies: Negative.   All other systems reviewed and are negative.       Objective:   BP 120/85   Pulse 88   Ht 5\' 4"  (1.626 m)   Wt 247 lb 6.4 oz (112.2 kg)   SpO2 96%   BMI 42.47 kg/m   Vitals:   02/12/23 1034  BP: 120/85  Pulse: 88  Height: 5\' 4"  (1.626 m)  Weight: 247 lb 6.4 oz (112.2 kg)  SpO2: 96%  BMI (Calculated): 42.45    Physical Exam Vitals and nursing note reviewed.  Constitutional:      Appearance: Normal appearance. She is normal weight.  HENT:     Head: Normocephalic and atraumatic.     Nose: Nose normal.     Mouth/Throat:     Mouth: Mucous membranes are moist.  Eyes:     Extraocular Movements: Extraocular movements intact.     Conjunctiva/sclera: Conjunctivae normal.     Pupils: Pupils are equal, round, and reactive to light.  Cardiovascular:     Rate and Rhythm: Normal rate and regular rhythm.     Pulses: Normal pulses.     Heart sounds: Normal heart sounds.  Pulmonary:     Effort: Pulmonary effort is normal.     Breath sounds: Normal breath sounds.  Abdominal:     General: Abdomen is flat. Bowel sounds are normal.     Palpations: Abdomen is soft.  Musculoskeletal:        General: Normal range of motion.     Cervical back: Normal range of motion.  Skin:    General: Skin is warm and dry.  Neurological:     General: No focal deficit present.     Mental Status: She is alert and oriented to person, place, and time.  Psychiatric:        Mood and Affect: Mood normal.        Behavior: Behavior normal.        Thought Content: Thought content normal.        Judgment: Judgment normal.      No results found for any visits on 02/12/23.  Recent Results (from the past 2160 hour(s))  POCT Urinalysis Dipstick (11914)     Status: Abnormal   Collection Time: 11/26/22  3:42 PM  Result Value Ref Range   Color, UA yellow    Clarity, UA turbid    Glucose, UA Negative Negative   Bilirubin, UA negative    Ketones, UA negative    Spec  Grav, UA >=1.030 (A) 1.010 - 1.025   Blood, UA negative    pH, UA 6.0 5.0 - 8.0    Protein, UA Negative Negative   Urobilinogen, UA 0.2 0.2 or 1.0 E.U./dL   Nitrite, UA negative    Leukocytes, UA Small (1+) (A) Negative   Appearance turbid    Odor none   POCT Urinalysis Dipstick (16109)     Status: Abnormal   Collection Time: 12/07/22 10:28 AM  Result Value Ref Range   Color, UA     Clarity, UA     Glucose, UA Negative Negative   Bilirubin, UA Negative    Ketones, UA Negative    Spec Grav, UA 1.020 1.010 - 1.025   Blood, UA Negative    pH, UA 6.0 5.0 - 8.0   Protein, UA Negative Negative   Urobilinogen, UA 0.2 0.2 or 1.0 E.U./dL   Nitrite, UA Negative    Leukocytes, UA Moderate (2+) (A) Negative   Appearance     Odor    Urinalysis, Complete     Status: Abnormal   Collection Time: 12/17/22  8:29 AM  Result Value Ref Range   Specific Gravity, UA >1.030 (H) 1.005 - 1.030   pH, UA 5.5 5.0 - 7.5   Color, UA Yellow Yellow   Appearance Ur Hazy (A) Clear   Leukocytes,UA 1+ (A) Negative   Protein,UA Negative Negative/Trace   Glucose, UA Negative Negative   Ketones, UA Negative Negative   RBC, UA Trace (A) Negative   Bilirubin, UA Negative Negative   Urobilinogen, Ur 0.2 0.2 - 1.0 mg/dL   Nitrite, UA Negative Negative   Microscopic Examination See below:   Microscopic Examination     Status: Abnormal   Collection Time: 12/17/22  8:29 AM   Urine  Result Value Ref Range   WBC, UA >30 (A) 0 - 5 /hpf   RBC, Urine 11-30 (A) 0 - 2 /hpf   Epithelial Cells (non renal) 0-10 0 - 10 /hpf   Mucus, UA Present (A) Not Estab.   Bacteria, UA Many (A) None seen/Few  Clostridium Difficile by PCR     Status: None   Collection Time: 12/17/22  8:30 AM   Specimen: STOOL   ST  Result Value Ref Range   Toxigenic C. Difficile by PCR Negative Negative  CULTURE, URINE COMPREHENSIVE     Status: Abnormal   Collection Time: 12/17/22  8:49 AM   Specimen: Urine   UR  Result Value Ref Range   Urine Culture, Comprehensive Final report (A)    Organism ID, Bacteria Escherichia coli  (A)     Comment: Cefazolin <=4 ug/mL Cefazolin with an MIC <=16 predicts susceptibility to the oral agents cefaclor, cefdinir, cefpodoxime, cefprozil, cefuroxime, cephalexin, and loracarbef when used for therapy of uncomplicated urinary tract infections due to E. coli, Klebsiella pneumoniae, and Proteus mirabilis. Greater than 100,000 colony forming units per mL    Organism ID, Bacteria Comment     Comment: Mixed urogenital flora 50,000-100,000 colony forming units per mL    ANTIMICROBIAL SUSCEPTIBILITY Comment     Comment:       ** S = Susceptible; I = Intermediate; R = Resistant **                    P = Positive; N = Negative             MICS are expressed in micrograms per mL    Antibiotic  RSLT#1    RSLT#2    RSLT#3    RSLT#4 Amoxicillin/Clavulanic Acid    S Ampicillin                     S Cefepime                       S Ceftriaxone                    S Cefuroxime                     S Ciprofloxacin                  S Ertapenem                      S Gentamicin                     S Imipenem                       S Levofloxacin                   S Meropenem                      S Nitrofurantoin                 S Piperacillin/Tazobactam        S Tetracycline                   S Tobramycin                     S Trimethoprim/Sulfa             S   CBC     Status: None   Collection Time: 01/01/23 12:14 PM  Result Value Ref Range   WBC 7.0 4.0 - 10.5 K/uL   RBC 4.34 3.87 - 5.11 MIL/uL   Hemoglobin 12.4 12.0 - 15.0 g/dL   HCT 78.2 95.6 - 21.3 %   MCV 89.4 80.0 - 100.0 fL   MCH 28.6 26.0 - 34.0 pg   MCHC 32.0 30.0 - 36.0 g/dL   RDW 08.6 57.8 - 46.9 %   Platelets 282 150 - 400 K/uL   nRBC 0.0 0.0 - 0.2 %    Comment: Performed at [facility], [address] [place], [place] [address]  Basic metabolic panel     Status: Abnormal   Collection Time: 01/01/23 12:14 PM  Result Value Ref Range   Sodium 138 135 - 145 mmol/L   Potassium 3.7 3.5 -  5.1 mmol/L   Chloride 103 98 - 111 mmol/L   CO2 27 22 - 32 mmol/L   Glucose, Bld 113 (H) 70 - 99 mg/dL    Comment: Glucose reference range applies only to samples taken after fasting for at least 8 hours.   BUN 12 6 - 20 mg/dL   Creatinine, Ser 8.41 0.44 - 1.00 mg/dL   Calcium 8.6 (L) 8.9 - 10.3 mg/dL   GFR, Estimated >32 >44 mL/min    Comment: (NOTE) Calculated using the CKD-EPI Creatinine Equation (2021)    Anion gap 8 5 - 15    Comment: Performed at [facility], [address] [place], [place] [address]  Hemoglobin A1c  Status: Abnormal   Collection Time: 01/03/23  3:32 PM  Result Value Ref Range   Hgb A1c MFr Bld 6.1 (H) 4.8 - 5.6 %    Comment:          Prediabetes: 5.7 - 6.4          Diabetes: >6.4          Glycemic control for adults with diabetes: <7.0    Est. average glucose Bld gHb Est-mCnc 128 mg/dL  VITAMIN D 25 Hydroxy (Vit-D Deficiency, Fractures)     Status: Abnormal   Collection Time: 01/03/23  3:32 PM  Result Value Ref Range   Vit D, 25-Hydroxy 19.1 (L) 30.0 - 100.0 ng/mL    Comment: Vitamin D deficiency has been defined by the Institute of Medicine and an Endocrine Society practice guideline as a level of serum 25-OH vitamin D less than 20 ng/mL (1,2). The Endocrine Society went on to further define vitamin D insufficiency as a level between 21 and 29 ng/mL (2). 1. IOM (Institute of Medicine). 2010. Dietary reference    intakes for calcium and D. [place]: The    [employer]. 2. Holick MF, Binkley Alba, Bischoff-Ferrari HA, et al.    Evaluation, treatment, and prevention of vitamin D    deficiency: an Endocrine Society clinical practice    guideline. JCEM. 2011 Jul; 96(7):1911-30.   Vitamin B12     Status: None   Collection Time: 01/03/23  3:32 PM  Result Value Ref Range   Vitamin B-12 305 232 - 1,245 pg/mL  Hemoglobin A1c     Status: Abnormal   Collection Time: 02/05/23 11:27 AM  Result Value Ref Range   Hgb A1c MFr Bld  6.2 (H) 4.8 - 5.6 %    Comment:          Prediabetes: 5.7 - 6.4          Diabetes: >6.4          Glycemic control for adults with diabetes: <7.0    Est. average glucose Bld gHb Est-mCnc 131 mg/dL  TSH     Status: Abnormal   Collection Time: 02/05/23 11:27 AM  Result Value Ref Range   TSH 12.400 (H) 0.450 - 4.500 uIU/mL  CMP14+EGFR     Status: Abnormal   Collection Time: 02/05/23 11:27 AM  Result Value Ref Range   Glucose 100 (H) 70 - 99 mg/dL   BUN 10 6 - 24 mg/dL   Creatinine, Ser 0.98 0.57 - 1.00 mg/dL   eGFR 119 >14 NW/GNF/6.21   BUN/Creatinine Ratio 16 9 - 23   Sodium 139 134 - 144 mmol/L   Potassium 4.3 3.5 - 5.2 mmol/L   Chloride 102 96 - 106 mmol/L   CO2 24 20 - 29 mmol/L   Calcium 8.8 8.7 - 10.2 mg/dL   Total Protein 6.2 6.0 - 8.5 g/dL   Albumin 3.7 (L) 3.8 - 4.9 g/dL   Globulin, Total 2.5 1.5 - 4.5 g/dL   Bilirubin Total 0.3 0.0 - 1.2 mg/dL   Alkaline Phosphatase 126 (H) 44 - 121 IU/L   AST 18 0 - 40 IU/L   ALT 18 0 - 32 IU/L  Lipid panel     Status: Abnormal   Collection Time: 02/05/23 11:27 AM  Result Value Ref Range   Cholesterol, Total 185 100 - 199 mg/dL   Triglycerides 308 (H) 0 - 149 mg/dL   HDL 48 >65 mg/dL   VLDL Cholesterol Cal 40 5 -  40 mg/dL   LDL Chol Calc (NIH) 97 0 - 99 mg/dL   Chol/HDL Ratio 3.9 0.0 - 4.4 ratio    Comment:                                   T. Chol/HDL Ratio                                             Men  Women                               1/2 Avg.Risk  3.4    3.3                                   Avg.Risk  5.0    4.4                                2X Avg.Risk  9.6    7.1                                3X Avg.Risk 23.4   11.0   CBC With Differential     Status: None   Collection Time: 02/05/23 11:27 AM  Result Value Ref Range   WBC 7.3 3.4 - 10.8 x10E3/uL   RBC 4.11 3.77 - 5.28 x10E6/uL   Hemoglobin 11.8 11.1 - 15.9 g/dL   Hematocrit 25.3 66.4 - 46.6 %   MCV 88 79 - 97 fL   MCH 28.7 26.6 - 33.0 pg   MCHC 32.8 31.5 -  35.7 g/dL   RDW 40.3 47.4 - 25.9 %   Neutrophils 61 Not Estab. %   Lymphs 27 Not Estab. %   Monocytes 7 Not Estab. %   Eos 4 Not Estab. %   Basos 1 Not Estab. %   Neutrophils Absolute 4.5 1.4 - 7.0 x10E3/uL   Lymphocytes Absolute 2.0 0.7 - 3.1 x10E3/uL   Monocytes Absolute 0.5 0.1 - 0.9 x10E3/uL   EOS (ABSOLUTE) 0.3 0.0 - 0.4 x10E3/uL   Basophils Absolute 0.0 0.0 - 0.2 x10E3/uL   Immature Granulocytes 0 Not Estab. %   Immature Grans (Abs) 0.0 0.0 - 0.1 x10E3/uL    Comment: **Effective February 11, 2023, profile [id number] CBC/Differential**   (No Platelet) will be made non-orderable. [facility] Offers:   [id number] CBC With Differential/Platelet       Assessment & Plan:  Chest xray today. Mucinex as directed on the bottle. Prednisone 40 mg for 5 days. Tessalon pearls as directed. Flonase daily. Doxycycline as prescribed.   Problem List Items Addressed This Visit       Other   Acute cough - Primary   Relevant Orders   DG Chest 2 View   Sore throat    Return if symptoms worsen or fail to improve, for as scheduled.   Total time spent: 30 minutes  [employer], NP  02/12/2023   This document may have been prepared by Dragon Voice Recognition software and as such may include unintentional dictation errors.

## 2023-02-13 ENCOUNTER — Encounter: Payer: Self-pay | Admitting: Urology

## 2023-02-13 ENCOUNTER — Encounter (INDEPENDENT_AMBULATORY_CARE_PROVIDER_SITE_OTHER): Payer: Self-pay | Admitting: Family Medicine

## 2023-02-14 ENCOUNTER — Encounter: Payer: Self-pay | Admitting: Urgent Care

## 2023-02-14 ENCOUNTER — Telehealth (INDEPENDENT_AMBULATORY_CARE_PROVIDER_SITE_OTHER): Payer: 59 | Admitting: Family Medicine

## 2023-02-14 DIAGNOSIS — E669 Obesity, unspecified: Secondary | ICD-10-CM | POA: Diagnosis not present

## 2023-02-14 DIAGNOSIS — Z6837 Body mass index (BMI) 37.0-37.9, adult: Secondary | ICD-10-CM

## 2023-02-14 DIAGNOSIS — E559 Vitamin D deficiency, unspecified: Secondary | ICD-10-CM

## 2023-02-14 DIAGNOSIS — R7303 Prediabetes: Secondary | ICD-10-CM

## 2023-02-14 DIAGNOSIS — E538 Deficiency of other specified B group vitamins: Secondary | ICD-10-CM | POA: Diagnosis not present

## 2023-02-14 NOTE — Progress Notes (Signed)
Alexis Christensen, D.O.  ABFM, ABOM Specializing in Clinical Bariatric Medicine  Office located at: [address] [place], [place]  [address]    TeleHealth Visit:   This visit was completed with telemedicine (audio/video) technology. Alexis Christensen has verbally consented to this TeleHealth visit. The patient is located at home, the provider is located at office. The participants in this visit include the listed provider, her scribe, and patient. The visit was conducted today via MyChart video.  Assessment and Plan:   Medications Discontinued During This Encounter  Medication Reason   benzonatate (TESSALON PERLES) 100 MG capsule Completed Course     Pre-diabetes Assessment & Plan:  Her prediabetes is being treated with Rybelsus 3 mg daily and Metformin 500 mg bid. She reports that the last time she took Metformin was Sunday. Per pt, she was told to temporarily stop her Metformin because of her upcoming surgery. She notes that her hunger and cravings are well controlled.   Lab Results  Component Value Date   HGBA1C 6.2 (H) 02/05/2023   HGBA1C 6.1 (H) 01/03/2023   HGBA1C 6.0 (H) 10/08/2022   INSULIN 13.8 02/14/2022   INSULIN 6.4 08/01/2021   INSULIN 7.4 03/28/2021    Continue with Rybelsus at current dose and restart Metformin when cleared by surgeons. Continue to decrease simple carbs/ sugars; increase fiber and proteins -> follow her meal plan. Will continue to monitor condition alongside PCP/specialists as it relates to their weight loss journey.   Vitamin D deficiency Assessment & Plan: Pt reports taking ERGO 50K international units twice wkly. She reports not making an appointment yet with her endocrinologist to discuss her low Vitamin D levels, but plans to in the near future.   Lab Results  Component Value Date   VD25OH 19.1 (L) 01/03/2023   VD25OH 16.2 (L) 07/03/2022   VD25OH 19.6 (L) 02/14/2022   Make appointment with ENDO to discuss Vitamin D levels. Continue with  weight loss efforts and ERGO at current dose. Pt denies a need for refill.    B12 deficiency Assessment & Plan: Last OV, pt endorses taking OTC Vitamin B12 500 mcg daily 50% of the time and reports that she has been working on improving her compliance.   Lab Results  Component Value Date   VITAMINB12 305 01/03/2023   Continue to improve adherence of OTC B12 supplement and continue prudent nutritional plan that is rich in b12 rich foods. We will continue to monitor as deemed clinically necessary.   TREATMENT PLAN FOR OBESITY: BMI 37.0-37.9, adult-current bmi 40.66 Obesity,current BMI 37.9 Assessment & Plan:  Alexis Christensen is here to discuss her progress with her obesity treatment plan along with follow-up of her obesity related diagnoses. See Medical Weight Management Flowsheet for complete bioelectrical impedance results.  Biometrics were not collected today because this was a video visit.    Total lbs lost since 01/31/23: + 8 lbs Total weight loss percentage since 01/31/23: + 2.6%   Continue the Category 1 meal plan with breakfast and lunch options.   Behavioral Intervention Additional resources provided today: patient declined Evidence-based interventions for health behavior change were utilized today including the discussion of self monitoring techniques, problem-solving barriers and SMART goal setting techniques.   Regarding patient's less desirable eating habits and patterns, we employed the technique of small changes.  Pt will specifically work on: walking 30+ minutes, 5 days per week and following the meal plan for lunch religiously  next visit.    Recommended Physical Activity  Goals Alexis Christensen has been advised to slowly work up to 150 minutes of moderate intensity aerobic activity a week and strengthening exercises 2-3 times per week for cardiovascular health, weight loss maintenance and preservation of muscle mass. She has agreed to Think about ways to increase daily physical  activity and overcoming barriers to exercise  FOLLOW UP: Return in 2-3 wks. She was informed of the importance of frequent follow up visits to maximize her success with intensive lifestyle modifications for her multiple health conditions.  Subjective:   Chief complaint: Obesity Alexis Christensen is here to discuss her progress with her obesity treatment plan. She is on the Category 1 Plan  with B/L options and states she is following her eating plan approximately 90% of the time. She states she is walking 30 minutes 4 days per week.  Interval History:  Alexis Christensen is here for a follow up video visit. We are meeting via video visit because [name] has some GI distress with nausea. Alexis Christensen is almost completely settled into her new place. She reports meal planning more and focusing on her protein intake. She also endorses increasing her water intake and only eating out once in the last 2 wks. Hunger and cravings are well controlled.   Pharmacotherapy for weight loss: She is prescribed Rybelsus 3 mg daily and Metformin 500 mg bid   for medical weight loss.  Denies side effects.    Review of Systems:  Pertinent positives were addressed with patient today.  Reviewed by clinician on day of visit: allergies, medications, problem list, medical history, surgical history, family history, social history, and previous encounter notes.  Weight Summary and Biometrics   No data recorded No data recorded   No data recorded No data recorded No data recorded No data recorded   Objective:   PHYSICAL EXAM: There were no vitals taken for this visit. There is no height or weight on file to calculate BMI.  General: Well Developed, well nourished, and in no acute distress.  HEENT: Normocephalic, atraumatic Skin: Warm and dry, cap RF less 2 sec, good turgor Chest:  Normal excursion, shape, no gross abn Respiratory: speaking in full sentences, no conversational dyspnea NeuroM-Sk: Ambulates w/o assistance, moves *  4 Psych: A and O *3, insight good, mood-full  DIAGNOSTIC DATA REVIEWED:  BMET    Component Value Date/Time   NA 139 02/05/2023 1127   NA 138 11/07/2012 0517   K 4.3 02/05/2023 1127   K 3.9 11/07/2012 0517   CL 102 02/05/2023 1127   CL 109 (H) 11/07/2012 0517   CO2 24 02/05/2023 1127   CO2 25 11/07/2012 0517   GLUCOSE 100 (H) 02/05/2023 1127   GLUCOSE 113 (H) 01/01/2023 1214   GLUCOSE 91 11/07/2012 0517   BUN 10 02/05/2023 1127   BUN 7 11/07/2012 0517   CREATININE 0.63 02/05/2023 1127   CREATININE 0.76 11/07/2012 0517   CALCIUM 8.8 02/05/2023 1127   CALCIUM 7.9 (L) 11/07/2012 0517   GFRNONAA >60 01/01/2023 1214   GFRNONAA >60 11/07/2012 0517   GFRAA >60 02/17/2020 0000   GFRAA >60 11/07/2012 0517   Lab Results  Component Value Date   HGBA1C 6.2 (H) 02/05/2023   HGBA1C 6.0 03/06/2021   Lab Results  Component Value Date   INSULIN 13.8 02/14/2022   INSULIN 7.4 03/28/2021   Lab Results  Component Value Date   TSH 12.400 (H) 02/05/2023   CBC    Component Value Date/Time   WBC 7.3 02/05/2023 1127   WBC  7.0 01/01/2023 1214   RBC 4.11 02/05/2023 1127   RBC 4.34 01/01/2023 1214   HGB 11.8 02/05/2023 1127   HCT 36.0 02/05/2023 1127   PLT 282 01/01/2023 1214   PLT 209 11/07/2012 0517   MCV 88 02/05/2023 1127   MCV 89 10/20/2012 1448   MCH 28.7 02/05/2023 1127   MCH 28.6 01/01/2023 1214   MCHC 32.8 02/05/2023 1127   MCHC 32.0 01/01/2023 1214   RDW 13.0 02/05/2023 1127   RDW 13.0 10/20/2012 1448   Iron Studies    Component Value Date/Time   IRON 39 07/13/2016 1055   TIBC 408 07/13/2016 1055   FERRITIN 11 07/13/2016 1055   IRONPCTSAT 10 (L) 07/13/2016 1055   Lipid Panel     Component Value Date/Time   CHOL 185 02/05/2023 1127   TRIG 235 (H) 02/05/2023 1127   HDL 48 02/05/2023 1127   CHOLHDL 3.9 02/05/2023 1127   LDLCALC 97 02/05/2023 1127   Hepatic Function Panel     Component Value Date/Time   PROT 6.2 02/05/2023 1127   ALBUMIN 3.7 (L) 02/05/2023  1127   AST 18 02/05/2023 1127   ALT 18 02/05/2023 1127   ALKPHOS 126 (H) 02/05/2023 1127   BILITOT 0.3 02/05/2023 1127      Component Value Date/Time   TSH 12.400 (H) 02/05/2023 1127   Nutritional Lab Results  Component Value Date   VD25OH 19.1 (L) 01/03/2023   VD25OH 16.2 (L) 07/03/2022   VD25OH 19.6 (L) 02/14/2022    Attestations:   I, Special Puri , acting as a Stage manager for Thomasene Lot, DO., have compiled all relevant documentation for today's office visit on behalf of Thomasene Lot, DO, while in the presence of [employer], DO.  I have reviewed the above documentation for accuracy and completeness, and I agree with the above. Alexis Christensen, D.O.  The 21st Century Cures Act was signed into law in 2016 which includes the topic of electronic health records.  This provides immediate access to information in MyChart.  This includes consultation notes, operative notes, office notes, lab results and pathology reports.  If you have any questions about what you read please let us know at your next visit so we can discuss your concerns and take corrective action if need be.  We are right here with you.

## 2023-02-17 MED ORDER — LACTATED RINGERS IV SOLN: INTRAVENOUS | Status: DC

## 2023-02-17 MED ORDER — CHLORHEXIDINE GLUCONATE 0.12 % MT SOLN
15.0000 mL | Freq: Once | OROMUCOSAL | Status: AC
  Administered 2023-02-18: 15 mL via OROMUCOSAL

## 2023-02-17 MED ORDER — GENTAMICIN SULFATE 40 MG/ML IJ SOLN
5.0000 mg/kg | INTRAVENOUS | Status: AC
  Administered 2023-02-18: 540 mg via INTRAVENOUS
  Filled 2023-02-17: qty 13.5

## 2023-02-17 MED ORDER — ORAL CARE MOUTH RINSE: 15.0000 mL | Freq: Once | OROMUCOSAL | Status: AC

## 2023-02-18 ENCOUNTER — Ambulatory Visit: Payer: 59

## 2023-02-18 ENCOUNTER — Encounter
Admission: RE | Disposition: A | Discharge status: Home / Self Care | Payer: Self-pay | Source: Home / Self Care | Attending: Urology

## 2023-02-18 ENCOUNTER — Ambulatory Visit: Admission: RE | Admit: 2023-02-18 | Payer: 59 | Source: Home / Self Care | Admitting: Urology

## 2023-02-18 ENCOUNTER — Other Ambulatory Visit: Payer: Self-pay

## 2023-02-18 ENCOUNTER — Ambulatory Visit: Payer: 59 | Admitting: Urgent Care

## 2023-02-18 ENCOUNTER — Encounter: Payer: Self-pay | Admitting: Urology

## 2023-02-18 DIAGNOSIS — R519 Headache, unspecified: Secondary | ICD-10-CM | POA: Insufficient documentation

## 2023-02-18 DIAGNOSIS — E785 Hyperlipidemia, unspecified: Secondary | ICD-10-CM | POA: Insufficient documentation

## 2023-02-18 DIAGNOSIS — K219 Gastro-esophageal reflux disease without esophagitis: Secondary | ICD-10-CM | POA: Diagnosis not present

## 2023-02-18 DIAGNOSIS — I5032 Chronic diastolic (congestive) heart failure: Secondary | ICD-10-CM | POA: Diagnosis not present

## 2023-02-18 DIAGNOSIS — R7303 Prediabetes: Secondary | ICD-10-CM | POA: Insufficient documentation

## 2023-02-18 DIAGNOSIS — F319 Bipolar disorder, unspecified: Secondary | ICD-10-CM | POA: Insufficient documentation

## 2023-02-18 DIAGNOSIS — Z86718 Personal history of other venous thrombosis and embolism: Secondary | ICD-10-CM | POA: Insufficient documentation

## 2023-02-18 DIAGNOSIS — N189 Chronic kidney disease, unspecified: Secondary | ICD-10-CM | POA: Insufficient documentation

## 2023-02-18 DIAGNOSIS — J45909 Unspecified asthma, uncomplicated: Secondary | ICD-10-CM | POA: Diagnosis not present

## 2023-02-18 DIAGNOSIS — Z4542 Encounter for adjustment and management of neuropacemaker (brain) (peripheral nerve) (spinal cord): Secondary | ICD-10-CM | POA: Diagnosis not present

## 2023-02-18 DIAGNOSIS — T83110A Breakdown (mechanical) of urinary electronic stimulator device, initial encounter: Secondary | ICD-10-CM

## 2023-02-18 DIAGNOSIS — E039 Hypothyroidism, unspecified: Secondary | ICD-10-CM | POA: Diagnosis not present

## 2023-02-18 DIAGNOSIS — N3941 Urge incontinence: Secondary | ICD-10-CM | POA: Diagnosis not present

## 2023-02-18 DIAGNOSIS — F419 Anxiety disorder, unspecified: Secondary | ICD-10-CM | POA: Insufficient documentation

## 2023-02-18 DIAGNOSIS — T83190A Other mechanical complication of urinary electronic stimulator device, initial encounter: Secondary | ICD-10-CM | POA: Diagnosis not present

## 2023-02-18 DIAGNOSIS — I739 Peripheral vascular disease, unspecified: Secondary | ICD-10-CM | POA: Insufficient documentation

## 2023-02-18 DIAGNOSIS — K449 Diaphragmatic hernia without obstruction or gangrene: Secondary | ICD-10-CM | POA: Diagnosis not present

## 2023-02-18 DIAGNOSIS — X58XXXA Exposure to other specified factors, initial encounter: Secondary | ICD-10-CM | POA: Insufficient documentation

## 2023-02-18 DIAGNOSIS — Z87891 Personal history of nicotine dependence: Secondary | ICD-10-CM | POA: Diagnosis not present

## 2023-02-18 DIAGNOSIS — D649 Anemia, unspecified: Secondary | ICD-10-CM | POA: Diagnosis not present

## 2023-02-18 DIAGNOSIS — I509 Heart failure, unspecified: Secondary | ICD-10-CM | POA: Diagnosis not present

## 2023-02-18 HISTORY — DX: Nontoxic multinodular goiter: E04.2

## 2023-02-18 HISTORY — PX: INTERSTIM IMPLANT PLACEMENT: SHX5130

## 2023-02-18 HISTORY — DX: Carpal tunnel syndrome, left upper limb: G56.02

## 2023-02-18 HISTORY — PX: INTERSTIM IMPLANT REMOVAL: SHX5131

## 2023-02-18 HISTORY — DX: Chronic diastolic (congestive) heart failure: I50.32

## 2023-02-18 HISTORY — DX: Calculus of kidney: N20.0

## 2023-02-18 LAB — POCT I-STAT, CHEM 8
BUN: 17 mg/dL (ref 6–20)
Calcium, Ion: 0.87 mmol/L — CL (ref 1.15–1.40)
Chloride: 107 mmol/L (ref 98–111)
Creatinine, Ser: 0.7 mg/dL (ref 0.44–1.00)
Glucose, Bld: 95 mg/dL (ref 70–99)
HCT: 35 % — ABNORMAL LOW (ref 36.0–46.0)
Hemoglobin: 11.9 g/dL — ABNORMAL LOW (ref 12.0–15.0)
Potassium: 4.8 mmol/L (ref 3.5–5.1)
Sodium: 133 mmol/L — ABNORMAL LOW (ref 135–145)
TCO2: 24 mmol/L (ref 22–32)

## 2023-02-18 SURGERY — REMOVAL, NEUROSTIMULATOR, SACRAL
Anesthesia: General

## 2023-02-18 MED ORDER — FENTANYL CITRATE (PF) 100 MCG/2ML IJ SOLN
INTRAMUSCULAR | Status: AC
  Filled 2023-02-18: qty 2

## 2023-02-18 MED ORDER — BUPIVACAINE HCL (PF) 0.5 % IJ SOLN
INTRAMUSCULAR | Status: AC
  Filled 2023-02-18: qty 30

## 2023-02-18 MED ORDER — VANCOMYCIN HCL IN DEXTROSE 1-5 GM/200ML-% IV SOLN
INTRAVENOUS | Status: AC
  Filled 2023-02-18: qty 200

## 2023-02-18 MED ORDER — CHLORHEXIDINE GLUCONATE 0.12 % MT SOLN
OROMUCOSAL | Status: AC
  Filled 2023-02-18: qty 15

## 2023-02-18 MED ORDER — ROCURONIUM BROMIDE 100 MG/10ML IV SOLN
INTRAVENOUS | Status: DC | PRN
  Administered 2023-02-18: 50 mg via INTRAVENOUS

## 2023-02-18 MED ORDER — ONDANSETRON HCL 4 MG/2ML IJ SOLN
INTRAMUSCULAR | Status: DC | PRN
Start: 2023-02-18 — End: 2023-02-18
  Administered 2023-02-18: 4 mg via INTRAVENOUS

## 2023-02-18 MED ORDER — OXYCODONE HCL 5 MG PO TABS
ORAL_TABLET | ORAL | Status: AC
  Filled 2023-02-18: qty 1

## 2023-02-18 MED ORDER — OXYCODONE HCL 5 MG/5ML PO SOLN: 5.0000 mg | Freq: Once | ORAL | Status: AC | PRN

## 2023-02-18 MED ORDER — PHENYLEPHRINE HCL (PRESSORS) 10 MG/ML IV SOLN
INTRAVENOUS | Status: DC | PRN
  Administered 2023-02-18: 160 ug via INTRAVENOUS
  Administered 2023-02-18: 80 ug via INTRAVENOUS

## 2023-02-18 MED ORDER — MIDAZOLAM HCL 2 MG/2ML IJ SOLN
INTRAMUSCULAR | Status: DC | PRN
  Administered 2023-02-18: 2 mg via INTRAVENOUS

## 2023-02-18 MED ORDER — LIDOCAINE-EPINEPHRINE (PF) 1 %-1:200000 IJ SOLN
INTRAMUSCULAR | Status: DC | PRN
  Administered 2023-02-18: 26 mL
  Administered 2023-02-18: 24 mL

## 2023-02-18 MED ORDER — VANCOMYCIN HCL 1000 MG IV SOLR
INTRAVENOUS | Status: DC | PRN
  Administered 2023-02-18: 1000 mg via INTRAVENOUS

## 2023-02-18 MED ORDER — DEXAMETHASONE SODIUM PHOSPHATE 10 MG/ML IJ SOLN
INTRAMUSCULAR | Status: DC | PRN
  Administered 2023-02-18: 10 mg via INTRAVENOUS

## 2023-02-18 MED ORDER — CEPHALEXIN 500 MG PO CAPS: 500.0000 mg | ORAL_CAPSULE | Freq: Three times a day (TID) | ORAL | 0 refills | Status: AC

## 2023-02-18 MED ORDER — SUCCINYLCHOLINE CHLORIDE 200 MG/10ML IV SOSY
PREFILLED_SYRINGE | INTRAVENOUS | Status: DC | PRN
  Administered 2023-02-18: 100 mg via INTRAVENOUS

## 2023-02-18 MED ORDER — ACETAMINOPHEN 10 MG/ML IV SOLN
INTRAVENOUS | Status: DC | PRN
  Administered 2023-02-18: 1000 mg via INTRAVENOUS

## 2023-02-18 MED ORDER — BUPIVACAINE-EPINEPHRINE (PF) 0.5% -1:200000 IJ SOLN
INTRAMUSCULAR | Status: AC
  Filled 2023-02-18: qty 30

## 2023-02-18 MED ORDER — HYDROCODONE-ACETAMINOPHEN 5-325 MG PO TABS: 1.0000 | ORAL_TABLET | Freq: Four times a day (QID) | ORAL | 0 refills | Status: DC | PRN

## 2023-02-18 MED ORDER — OXYCODONE HCL 5 MG PO TABS
5.0000 mg | ORAL_TABLET | Freq: Once | ORAL | Status: AC | PRN
  Administered 2023-02-18: 5 mg via ORAL

## 2023-02-18 MED ORDER — SUGAMMADEX SODIUM 200 MG/2ML IV SOLN
INTRAVENOUS | Status: DC | PRN
  Administered 2023-02-18: 200 mg via INTRAVENOUS
  Administered 2023-02-18: 400 mg via INTRAVENOUS

## 2023-02-18 MED ORDER — PROPOFOL 500 MG/50ML IV EMUL
INTRAVENOUS | Status: DC | PRN
  Administered 2023-02-18: 50 ug/kg/min via INTRAVENOUS

## 2023-02-18 MED ORDER — FENTANYL CITRATE (PF) 100 MCG/2ML IJ SOLN: 25.0000 ug | INTRAMUSCULAR | Status: DC | PRN

## 2023-02-18 MED ORDER — PROPOFOL 10 MG/ML IV BOLUS
INTRAVENOUS | Status: DC | PRN
Start: 2023-02-18 — End: 2023-02-18
  Administered 2023-02-18 (×2): 100 mg via INTRAVENOUS
  Administered 2023-02-18 (×2): 20 mg via INTRAVENOUS

## 2023-02-18 MED ORDER — LIDOCAINE-EPINEPHRINE (PF) 1 %-1:200000 IJ SOLN
INTRAMUSCULAR | Status: AC
  Filled 2023-02-18: qty 30

## 2023-02-18 MED ORDER — MIDAZOLAM HCL 2 MG/2ML IJ SOLN
INTRAMUSCULAR | Status: AC
  Filled 2023-02-18: qty 2

## 2023-02-18 MED ORDER — FENTANYL CITRATE (PF) 100 MCG/2ML IJ SOLN
INTRAMUSCULAR | Status: DC | PRN
  Administered 2023-02-18 (×2): 50 ug via INTRAVENOUS

## 2023-02-18 SURGICAL SUPPLY — 63 items
ADH SKN CLS APL DERMABOND .7 (GAUZE/BANDAGES/DRESSINGS) ×1
APL PRP STRL LF DISP 70% ISPRP (MISCELLANEOUS) ×1
APL SKNCLS STERI-STRIP NONHPOA (GAUZE/BANDAGES/DRESSINGS)
BENZOIN TINCTURE PRP APPL 2/3 (GAUZE/BANDAGES/DRESSINGS) IMPLANT
BLADE SURG 15 STRL LF DISP TIS (BLADE) ×2 IMPLANT
BLADE SURG 15 STRL SS (BLADE) ×1
CHLORAPREP W/TINT 26 (MISCELLANEOUS) ×2 IMPLANT
COVER MAYO STAND STRL (DRAPES) ×2 IMPLANT
COVER PROBE FLX POLY STRL (MISCELLANEOUS) ×2 IMPLANT
DERMABOND ADVANCED .7 DNX12 (GAUZE/BANDAGES/DRESSINGS) ×2 IMPLANT
DRAPE 3/4 80X56 (DRAPES) ×2 IMPLANT
DRAPE C-ARM 42X72 X-RAY (DRAPES) ×4 IMPLANT
DRAPE C-ARM XRAY 36X54 (DRAPES) ×2 IMPLANT
DRAPE C-ARMOR (DRAPES) ×2 IMPLANT
DRAPE INCISE 23X17 STRL (DRAPES) ×2 IMPLANT
DRAPE INCISE IOBAN 23X17 STRL (DRAPES) ×1
DRAPE INCISE IOBAN 66X45 STRL (DRAPES) ×2 IMPLANT
DRAPE LAPAROTOMY 77X122 PED (DRAPES) IMPLANT
DRAPE LAPAROTOMY TRNSV 106X77 (MISCELLANEOUS) ×2 IMPLANT
DRAPE TABLE BACK 80X90 (DRAPES) ×2 IMPLANT
DRSG TEGADERM 2-3/8X2-3/4 SM (GAUZE/BANDAGES/DRESSINGS) ×2 IMPLANT
DRSG TEGADERM 4X4.75 (GAUZE/BANDAGES/DRESSINGS) ×2 IMPLANT
DRSG TELFA 3X8 NADH STRL (GAUZE/BANDAGES/DRESSINGS) ×2 IMPLANT
ELECT REM PT RETURN 9FT ADLT (ELECTROSURGICAL) ×1
ELECTRODE REM PT RTRN 9FT ADLT (ELECTROSURGICAL) ×2 IMPLANT
GAUZE 4X4 16PLY ~~LOC~~+RFID DBL (SPONGE) ×2 IMPLANT
GAUZE SPONGE 4X4 12PLY STRL (GAUZE/BANDAGES/DRESSINGS) IMPLANT
GLOVE BIO SURGEON STRL SZ7.5 (GLOVE) ×2 IMPLANT
GOWN STRL REUS W/ TWL XL LVL3 (GOWN DISPOSABLE) ×2 IMPLANT
GOWN STRL REUS W/TWL XL LVL3 (GOWN DISPOSABLE) ×4
HANDLE YANKAUER SUCT BULB TIP (MISCELLANEOUS) IMPLANT
HOLSTER ELECTROSUGICAL PENCIL (MISCELLANEOUS) IMPLANT
KIT HANDSET INTERSTIM COMM (NEUROSURGERY SUPPLIES) IMPLANT
KIT TURNOVER CYSTO (KITS) ×2 IMPLANT
LEAD INTERSTIM 4.32 28 L (Lead) IMPLANT
MANIFOLD NEPTUNE II (INSTRUMENTS) ×2 IMPLANT
NDL HYPO 22X1.5 SAFETY MO (MISCELLANEOUS) ×2 IMPLANT
NEEDLE HYPO 22X1.5 SAFETY MO (MISCELLANEOUS) ×1
NEUROSTIMULATOR 1.7X2X.06 (UROLOGICAL SUPPLIES) IMPLANT
NS IRRIG 500ML POUR BTL (IV SOLUTION) ×2 IMPLANT
PACK BASIN MINOR ARMC (MISCELLANEOUS) ×2 IMPLANT
STAPLER SKIN PROX 35W (STAPLE) IMPLANT
STIMULATOR INTERSTIM 2X1.7X.3 (Miscellaneous) ×2 IMPLANT
STRIP CLOSURE SKIN 1/2X4 (GAUZE/BANDAGES/DRESSINGS) ×2 IMPLANT
SUCTION TUBE FRAZIER 10FR DISP (SUCTIONS) ×2 IMPLANT
SUT MNCRL 4-0 (SUTURE) ×2
SUT MNCRL 4-0 27XMFL (SUTURE) ×2
SUT SILK 2 0 (SUTURE)
SUT SILK 2-0 18XBRD TIE 12 (SUTURE) IMPLANT
SUT VIC AB 3-0 SH 27 (SUTURE) ×3
SUT VIC AB 3-0 SH 27X BRD (SUTURE) ×4 IMPLANT
SUT VIC AB 4-0 PS2 18 (SUTURE) ×2 IMPLANT
SUT VIC AB 4-0 RB1 27 (SUTURE)
SUT VIC AB 4-0 RB1 27X BRD (SUTURE) ×6 IMPLANT
SUTURE MNCRL 4-0 27XMF (SUTURE) ×2 IMPLANT
SYR 10ML LL (SYRINGE) ×4 IMPLANT
SYR BULB IRRIG 60ML STRL (SYRINGE) ×2 IMPLANT
SYR CONTROL 10ML LL (SYRINGE) IMPLANT
TOWEL OR 17X26 4PK STRL BLUE (TOWEL DISPOSABLE) ×4 IMPLANT
TRAP FLUID SMOKE EVACUATOR (MISCELLANEOUS) ×2 IMPLANT
TUBING CONNECTING 10 (TUBING) IMPLANT
WATER STERILE IRR 1000ML POUR (IV SOLUTION) ×2 IMPLANT
WATER STERILE IRR 500ML POUR (IV SOLUTION) ×2 IMPLANT

## 2023-02-18 NOTE — Progress Notes (Signed)
CV and RESP exam normal; Reivewed post op course

## 2023-02-18 NOTE — Interval H&P Note (Signed)
History and Physical Interval Note:  02/18/2023 12:19 PM  Alexis Christensen  has presented today for surgery, with the diagnosis of Malfunctioning Interstim,Short battery life, Urge Incontinence.  The various methods of treatment have been discussed with the patient and family. After consideration of risks, benefits and other options for treatment, the patient has consented to  Procedure(s): REMOVAL OF INTERSTIM IMPLANT (N/A) INTERSTIM IMPLANT FIRST STAGE WITH IMPEDANCE CHECK (N/A) INTERSTIM IMPLANT SECOND STAGE (N/A) as a surgical intervention.  The patient's history has been reviewed, patient examined, no change in status, stable for surgery.  I have reviewed the patient's chart and labs.  Questions were answered to the patient's satisfaction.      A 

## 2023-02-18 NOTE — Anesthesia Procedure Notes (Signed)
Procedure Name: Intubation Date/Time: 02/18/2023 1:48 PM  Performed by: Ginger Carne, CRNAPre-anesthesia Checklist: Patient identified, Emergency Drugs available, Suction available, Patient being monitored and Timeout performed Patient Re-evaluated:Patient Re-evaluated prior to induction Oxygen Delivery Method: Circle system utilized Preoxygenation: Pre-oxygenation with 100% oxygen Induction Type: IV induction and Rapid sequence Ventilation: Oral airway inserted - appropriate to patient size Laryngoscope Size: McGraph and 3 Grade View: Grade I Tube type: Oral Tube size: 6.5 mm Number of attempts: 1 Airway Equipment and Method: Stylet and Video-laryngoscopy Placement Confirmation: ETT inserted through vocal cords under direct vision, positive ETCO2 and breath sounds checked- equal and bilateral Secured at: 21 cm Tube secured with: Tape Dental Injury: Teeth and Oropharynx as per pre-operative assessment

## 2023-02-18 NOTE — Anesthesia Postprocedure Evaluation (Signed)
Anesthesia Post Note  Patient: Caylor M Dial  Procedure(s) Performed: REMOVAL OF INTERSTIM IMPLANT INTERSTIM IMPLANT FIRST STAGE WITH IMPEDANCE CHECK INTERSTIM IMPLANT SECOND STAGE  Patient location during evaluation: PACU Anesthesia Type: General Level of consciousness: awake and alert Pain management: pain level controlled Vital Signs Assessment: post-procedure vital signs reviewed and stable Respiratory status: spontaneous breathing, nonlabored ventilation, respiratory function stable and patient connected to nasal cannula oxygen Cardiovascular status: blood pressure returned to baseline and stable Postop Assessment: no apparent nausea or vomiting Anesthetic complications: no   No notable events documented.   Last Vitals:  Vitals:   02/18/23 1645 02/18/23 1653  BP:  129/69  Pulse: 73 70  Resp: 15 18  Temp:  (!) 36.1 C  SpO2: 97% 97%    Last Pain:  Vitals:   02/18/23 1653  TempSrc: Temporal  PainSc: 2                  Corinda Gubler

## 2023-02-18 NOTE — Anesthesia Procedure Notes (Signed)
Date/Time: 02/18/2023 1:00 PM  Performed by: Ginger Carne, CRNAPre-anesthesia Checklist: Patient identified, Emergency Drugs available, Suction available, Timeout performed and Patient being monitored Patient Re-evaluated:Patient Re-evaluated prior to induction Oxygen Delivery Method: Nasal cannula Preoxygenation: Pre-oxygenation with 100% oxygen Induction Type: IV induction

## 2023-02-18 NOTE — Op Note (Addendum)
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 Alfredo Martinez.  The patient has the above diagnoses and consented the above procedure.  Preoperative antibiotics were given.  Extra care was taken with patient positioning in the prone position.  I could easily identify the IPG in the left upper buttock and the lead.  I initially instilled 20 cc of lidocaine epinephrine mixture after marking the left buttock incision.  I dissected down with scalpel cautery and cutting current using cutting current to open the pseudocapsule widely.  I easily remove the IPG and placed a hemostat across the lead and cut the lead.  At this stage of the surgery anesthesia told me that the patient had thrown up bile and they were not comfortable with the airway to protect the patient from aspiration.  The patient was stable.  They were hoping to try to intubate the patient in the prone position.  They recommended after some airway management to roll the patient back off the table onto a gurney.  Before doing this I placed the lead in the wound and placed a new Ioban over the initial Ioban to keep the wound completely sterile.  I kept all drapes on the patient and we rolled the patient onto the stretcher not removing any drapes.  Anesthesia then intubated the patient and when they were happy we rolled the patient back in the prone position on the operating table.  I cut away a lot of the draped leaving drape over the buttock lower back and entire prepped area.  I then cut around the Ioban approximately 2 to 3 inches from the edge of the incision allowing [name] to remove the rest of the drape but leave the Ioban sterile over the open incision in the left buttock.  I then placed to a Tegaderm over the piece of  Ioban and then prepped the patient completely in the normal fashion.  I then remove the Tegaderm and underlying Ioban and use Hibitane to clean the skin further as well as the wound.  The wound was always completely sterile.  By pulling on the lead and looking at the skin incision and using fluoroscopy I marked a 2 and half centimeter incision to the left of the midline over the lead.  It was a little bit more lateral than the last scar.  I dissected down through skin and subcutaneous tissue finding the lead easily that was quite deep based on body habitus and I brought the lead from the lateral to medial  I then did finger dissection and careful sharp dissection with Metzenbaum scissors to dissect down near the bony table using deeper right angled retractors for exposure.  Using my usual technique a robust hemostat was placed across the lead parallel to the lead and was easily removed in total.  There was no retained tip by visual inspection and fluoroscopy  I then could easily pass a 3.5 inch foramen needle into the left S3 foramina.  She had excellent toe and bellows response at low amplitude.  I removed the inner aspect and passed the guide to the appropriate depth.  White trocar was placed to the appropriate depth.  Well-prepared coud shaped lead was placed to the appropriate depth.  The patient would get excellent toe and bellows responses at 2 and 3 but not 0 and 1 in spite of replacing the  lead at different angles or orientations 5 times.  I would use AP and lateral x-ray and there really was not much stacking but there was not good distal responses so I went to the patient's right side.  It was easy to find the S3 foramina on the right with a 3.5 inch foramen needle after instilling 10 cc of lidocaine epinephrine mixture.  There was excellent toe and bellows responses at low amplitude inner aspect was removed.  Guide was placed the appropriate depth.  Scalpel incision was made.  White trocar was  placed the appropriate depth.  The lead was placed towards the appropriate depth with there was resistance.  I therefore removed it and we replaced inner aspect of the trocar.  I advanced it 1 or 2 more millimeters.  The inner aspect was removed and the lead was easily inserted to the appropriate depth.  She had excellent tone bellows responses at all 4 leads settings.  This was at low amplitude.  Under fluoroscopic guidance I removed the white trocar and inner aspect of the lead.  AP and lateral x-rays were taken.  To minimize the chance of infection I decided to place the IPG in the patient's right recognizing sterile technique was used on the left.  Using soft tissue and bony landmarks I marked a 4 cm incision.  20 cc of lidocaine epinephrine mixture was utilized.  I dissected down through skin and subcutaneous tissue to the appropriate depth and made with cautery and finger dissection a nice pocket.  Lead was then passed from medial to lateral with a passer.  It was connected to the IPG with the described technique with screwdriver.  The IPG laid in very nicely tension-free into the pocket.  Impedance was checked and normal in all 4-lead positions  I closed the right buttock incision with 3-0 Vicryl subcutaneous tissue followed by Monocryl 4-0 for the skin.  I closed the right midline incision with 2 interrupted 4-0 Vicryl.  I closed the left midline incision that was longer with 2 interrupted 3-0 Vicryl followed by 4-0 Vicryl interrupted.  I opened up the back of the pseudocapsule in the left buttock incision.  I then closed the subcutaneous tissue with running 3-0 Vicryl followed by 4-0 Monocryl.  3 sterile dressings were applied.  I was very pleased with the surgery.  Anesthesia said she was very stable.  I will check her respiratory status and recovery.  I will leave my phone number.  Will call and check on her tomorrow.  She will be followed carefully for any breathing issues.  The surgery  actually went very well and hopefully she gets an excellent response.

## 2023-02-18 NOTE — Transfer of Care (Signed)
Immediate Anesthesia Transfer of Care Note  Patient: Alexis Christensen  Procedure(s) Performed: REMOVAL OF INTERSTIM IMPLANT INTERSTIM IMPLANT FIRST STAGE WITH IMPEDANCE CHECK INTERSTIM IMPLANT SECOND STAGE  Patient Location: PACU  Anesthesia Type:General  Level of Consciousness: awake, alert , and oriented  Airway & Oxygen Therapy: Patient Spontanous Breathing and Patient connected to face mask oxygen  Post-op Assessment: Report given to RN and Post -op Vital signs reviewed and stable  Post vital signs: Reviewed and stable  Last Vitals:  Vitals Value Taken Time  BP 156/87 02/18/23 1548  Temp    Pulse 76 02/18/23 1553  Resp 22 02/18/23 1553  SpO2 100 % 02/18/23 1553  Vitals shown include unfiled device data.  Last Pain:  Vitals:   02/18/23 1150  TempSrc: Oral  PainSc: 0-No pain         Complications: No notable events documented.

## 2023-02-18 NOTE — H&P (Signed)
The patient had InterStim on 11/26/2016 for persistent overactive bladder and urgency incontinence after sling. The patient no longer has stress incontinence and had failed multiple medications. The need for the InterStim was discussed pre-operatively.   She is continent with intermittent urgency. She used to have mild bedwetting and does not have it either. Frequency also improved.    She stays quite dry during the day.  She gets up a few times at night.  If she holds it too long she can has some foot on the floor syndrome but the device appears to be working very well   Almost completely continent and very pleased.     Patient has not been seen for a few years.   And was doing great until 2 months ago.  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  High-volume leakage.  No cystitis symptoms.  She has never changed the program or amplitude.   Patient has a new onset urge incontinence.  During troubleshooting that was only 1 program.  Impedance was normal.  It was turned up amplitude and she felt it in the vagina.  Maralyn Sago will speak to the representative and start up programming exercise and look into its data   When amplitude was increased patient did well.  She was not leaking.  Patient is completely continent and very pleased.  No infections.  Has not changed the amplitude or any settings.   I will see the patient as needed. Battery life discussed      Patient says she is getting approximately 2 bladder infections month with burning cramping and frequency that respond favorably to antibiotics.  InterStim still works really well.  She thinks she may be infected today.  I did not see recent urine cultures.  She had a CT scan last year that was normal.  She has a number of allergies   I thought it was best to send in ciprofloxacin 250 mg twice a day for 7 days. Pathophysiology of UTIs discussed. Reassess on Macrodantin 100 mg 30 x 11 in 8 weeks    2 days ago patient was having vague  abdominal pain and midline low back pain.  Frequency is stable.  No burning.  No foul-smelling urine.  He was doing well on daily Macrodantin up until this point     The urine did look positive but the symptoms were nonspecific. Because she is symptomatic I called in ciprofloxacin 250 mg twice a day for 7 days. She will see nurse practitioner in 3 weeks and if she did have a breakthrough infection she should be switched to either daily Keflex or perhaps trimethoprim. She gets hives with sulfa drugs. If culture is negative stay on Macrodantin. She will bring her device and she also wants her battery life checked the InterStim    Today Patient saw a nurse practitioner October 01, 2022 with some pelvic cramping but no burning.  There was no low battery issues when her device was interrogated.  Urine culture from my visit in early March normal.  No urine culture from March 18.  It turns out she talk to the [employer] rep and she is 12 to 15 months left on the battery.  Patient had a normal pelvic ultrasound May 09, 2022.  Normal CT scan in January 2022   She is voiding every 2 hours during the day and is dry.  She is getting up every 2 hours or even 5 or 6 times at night with a  strong urge to go.  She is having issues with ankle edema which is new or worsening.  She says she has had a bladder infection since May 8 but her symptoms were nausea vomiting diarrhea and some increased frequency and was treated with 1 or 2 antibiotics from an outside center.  The cramping that she did previously is much better.   In the past the patient had mixed incontinence and prior to having a sling she was aware that her urge incontinence nocturia and bedwetting could persist.  Post InterStim she would still normally get up a few times a night.  She had failed multiple medications.   For 2 years patient has considered an MRI for her low back.  She would like to be switched to an MRI compatible device which would also address  the 53-month battery life issue   Urine looked positive and sent for culture           PMH:     Past Medical History:  Diagnosis Date   Acute diastolic (congestive) heart failure (HCC)     ADHD     Adopted     Anemia     Anxiety     Arthritis     Asthma     B12 deficiency     Bilateral swelling of feet and ankles     Bipolar 1 disorder (HCC)     Bipolar disorder (HCC)     Carpal tunnel syndrome 09/15/2014   Chest pain     CHF (congestive heart failure) (HCC)      Dialostic CHF   Chronic diarrhea 09/07/2015   Chronic kidney disease      H/O KIDNEY STONES   Chronic venous insufficiency 04/26/2016   Depression     DVT (deep venous thrombosis) (HCC) 2016    RIGHT LEG   Edema leg     Effusion of knee 12/30/2013   Family history of adverse reaction to anesthesia      ADOPTED   Food allergy     GERD (gastroesophageal reflux disease)     Goiter     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   Headache(784.0)      MIGRAINES   Heart burn     Heart murmur     History of blood clots     History of kidney stones     HLD (hyperlipidemia)     Hx MRSA infection     Hyperlipidemia     Hypothyroidism     Lipoma of arm 05/11/2013   Lower extremity edema     Lymphedema 04/26/2016   Other fatigue     Post traumatic stress disorder (PTSD)     Prediabetes     PVD (peripheral vascular disease) (HCC)     Seasonal allergies     Shortness of breath     Shortness of breath on exertion     Stress incontinence     Thyroid disease     Urge incontinence     Vitamin D deficiency            Surgical History:      Past Surgical History:  Procedure Laterality Date   ABDOMINAL HYSTERECTOMY       ANKLE SURGERY       CHOLECYSTECTOMY       COLONOSCOPY WITH PROPOFOL N/A 06/24/2019    Procedure: COLONOSCOPY WITH PROPOFOL;  Surgeon: Toledo, Boykin Nearing, MD;  Location: [facility];  Service: Gastroenterology;  Laterality: N/A;   CYSTOSCOPY  N/A 04/09/2016    Procedure: CYSTOSCOPY;   Surgeon: Alfredo Martinez, MD;  Location: [facility];  Service: Urology;  Laterality: N/A;   DILATION AND CURETTAGE OF UTERUS       ESOPHAGOGASTRODUODENOSCOPY (EGD) WITH PROPOFOL N/A 06/24/2019    Procedure: ESOPHAGOGASTRODUODENOSCOPY (EGD) WITH PROPOFOL;  Surgeon: Toledo, Boykin Nearing, MD;  Location: [facility];  Service: Gastroenterology;  Laterality: N/A;   INGUINAL HERNIA REPAIR Bilateral 04/18/2017    Procedure: LAPAROSCOPIC BILATERAL INGUINAL HERNIA REPAIR;  Surgeon: Leafy Ro, MD;  Location: [facility];  Service: General;  Laterality: Bilateral;   INTERSTIM IMPLANT PLACEMENT N/A 11/26/2016    Procedure: Leane Platt IMPLANT FIRST STAGE;  Surgeon: Alfredo Martinez, MD;  Location: [facility];  Service: Urology;  Laterality: N/A;   INTERSTIM IMPLANT PLACEMENT N/A 11/26/2016    Procedure: Leane Platt IMPLANT SECOND STAGE;  Surgeon: Alfredo Martinez, MD;  Location: [facility];  Service: Urology;  Laterality: N/A;   JOINT REPLACEMENT Right      knee   KNEE ARTHROSCOPY Bilateral     KNEE ARTHROSCOPY WITH LATERAL RELEASE Left 10/18/2015    Procedure: KNEE ARTHROSCOPY LATERAL AND PARTIAL SYNOVECTOMY;  Surgeon: Kennedy Bucker, MD;  Location: [facility];  Service: Orthopedics;  Laterality: Left;   Lymph Node removal   2015    Neck   PUBOVAGINAL SLING N/A 04/09/2016    Procedure: PUBO-VAGINAL SLING/ RETROPUBIC SLING;  Surgeon: Alfredo Martinez, MD;  Location: [facility];  Service: Urology;  Laterality: N/A;   REPLACEMENT TOTAL KNEE Right     SHOULDER SURGERY Right 2014   TUBAL LIGATION       WRIST SURGERY Right      metal plate          Home Medications:  Allergies as of 12/17/2022         Reactions    Geodon [ziprasidone Hydrochloride] Other (See Comments)    Numbness ,sob, headaches, blurred vision    Sulfacetamide Sodium Hives    Ziprasidone Anaphylaxis    Other reaction(s): Other (See Comments) Numbness ,sob, headaches, blurred vision Other reaction(s): Other (See Comments), Unknown Numbness ,sob,  headaches, blurred vision Numbness ,sob, headaches, blurred vision Other reaction(s): Other (See Comments), Unknown Numbness ,sob, headaches, blurred vision Numbness ,sob, headaches, blurred vision Numbness ,sob, headaches, blurred vision Other reaction(s): Other (See Comments), Unknown Numbness ,sob, headaches, blurred vision Numbness ,sob, headaches, blurred vision Other reaction(s): Other (See Comments), Unknown Numbness ,sob, headaches, blurred vision Numbness ,sob, headaches, blurred vision    Ziprasidone Hcl Anaphylaxis, Other (See Comments)    Other reaction(s): Other (See Comments), Unknown Numbness ,sob, headaches, blurred vision Numbness ,sob, headaches, blurred vision    Ace Inhibitors Rash    Erythromycin Rash, Hives    Hydromorphone Rash    Lamictal [lamotrigine] Rash    Other reaction(s): Unknown    Strawberry (diagnostic) Rash    Sulfa Antibiotics Hives, Rash    Other reaction(s): Unknown Other reaction(s): Unknown Other reaction(s): Unknown            Medication List           Accurate as of December 17, 2022  8:24 AM. If you have any questions, ask your nurse or doctor.              albuterol 108 (90 Base) MCG/ACT inhaler Commonly known as: VENTOLIN HFA Inhale 1 puff into the lungs every 6 (six) hours as needed for wheezing or shortness of breath.    ARIPiprazole 30 MG tablet Commonly known as: ABILIFY Take 30  mg by mouth daily.    aspirin 81 MG chewable tablet Aspirin 81 MG Oral Tablet Chewable QTY: 30 tablet Days: 30 Refills: 0  Written: 01/23/21 Patient Instructions: once a day    benzonatate 100 MG capsule Commonly known as: Tessalon Perles Take 1 capsule (100 mg total) by mouth 3 (three) times daily as needed for cough.    Breztri Aerosphere 160-9-4.8 MCG/ACT Aero Generic drug: Budeson-Glycopyrrol-Formoterol Inhale 2 puffs into the lungs in the morning and at bedtime.    ciprofloxacin 500 MG tablet Commonly known as: Cipro Take 1 tablet  (500 mg total) by mouth 2 (two) times daily.    colestipol 1 g tablet Commonly known as: COLESTID Take 2 g by mouth 2 (two) times daily.    cyanocobalamin 500 MCG tablet Commonly known as: VITAMIN B12 Take 1 tablet (500 mcg total) by mouth daily.    dicyclomine 10 MG capsule Commonly known as: BENTYL TAKE 1 CAPSULE BY MOUTH EVERY 8 HOURS AS NEEDED FOR ABDOMINAL PAIN    diphenoxylate-atropine 2.5-0.025 MG tablet Commonly known as: Lomotil Take 1 tablet by mouth 4 (four) times daily as needed for diarrhea or loose stools.    doxycycline 100 MG tablet Commonly known as: VIBRA-TABS Take 1 tablet (100 mg total) by mouth 2 (two) times daily.    DULoxetine 60 MG capsule Commonly known as: CYMBALTA Take 60 mg by mouth 2 (two) times daily.    esomeprazole 40 MG capsule Commonly known as: NexIUM Take 1 capsule (40 mg total) by mouth 2 (two) times daily before a meal.    famotidine 20 MG tablet Commonly known as: PEPCID Take 1 tablet (20 mg total) by mouth 2 (two) times daily.    furosemide 40 MG tablet Commonly known as: LASIX TAKE 1 TABLET BY MOUTH EVERY DAY    gabapentin 600 MG tablet Commonly known as: NEURONTIN Take 600 mg by mouth 2 (two) times daily.    hydrOXYzine 25 MG tablet Commonly known as: ATARAX Take 50 mg by mouth every 8 (eight) hours as needed for itching.    Klor-Con M10 10 MEQ tablet Generic drug: potassium chloride Take 10 mEq by mouth daily.    levothyroxine 200 MCG tablet Commonly known as: SYNTHROID PLEASE SEE ATTACHED FOR DETAILED DIRECTIONS    liothyronine 5 MCG tablet Commonly known as: CYTOMEL Take 5 mcg by mouth daily.    lisinopril 5 MG tablet Commonly known as: ZESTRIL TAKE 1 TABLET BY MOUTH EVERY DAY    metFORMIN 500 MG tablet Commonly known as: GLUCOPHAGE Take 1 tablet (500 mg total) by mouth 3 (three) times daily.    methocarbamol 500 MG tablet Commonly known as: ROBAXIN Take 1 tablet (500 mg total) by mouth 3 (three) times  daily.    metoprolol tartrate 50 MG tablet Commonly known as: LOPRESSOR Take 50 mg by mouth 2 (two) times daily.    montelukast 10 MG tablet Commonly known as: SINGULAIR Take 10 mg by mouth daily.    mupirocin ointment 2 % Commonly known as: BACTROBAN Apply 1 Application topically 2 (two) times daily.    nitrofurantoin (macrocrystal-monohydrate) 100 MG capsule Commonly known as: MACROBID Take 1 capsule (100 mg total) by mouth 2 (two) times daily.    nortriptyline 10 MG capsule Commonly known as: PAMELOR Take 10 mg in the morning    nystatin powder Commonly known as: nystatin Apply 1 Application topically 3 (three) times daily.    ondansetron 4 MG tablet Commonly known as: ZOFRAN Take 4 mg  by mouth every 8 (eight) hours as needed.    predniSONE 50 MG tablet Commonly known as: DELTASONE Take 1 tablet by mouth daily in AM with food x 5 days    Repatha SureClick 140 MG/ML Soaj Generic drug: Evolocumab Inject into the skin.    rosuvastatin 40 MG tablet Commonly known as: CRESTOR Take 40 mg by mouth daily.    Rybelsus 3 MG Tabs Generic drug: Semaglutide Take 1 tablet (3 mg total) by mouth daily.    spironolactone 25 MG tablet Commonly known as: ALDACTONE TAKE 1/2 (HALF) TABLET BY MOUTH ONCE DAILY.    Suboxone 8-2 MG Film Generic drug: Buprenorphine HCl-Naloxone HCl Dissolve 1 film under tongue twice daily    Vitamin D (Ergocalciferol) 1.25 MG (50000 UNIT) Caps capsule Commonly known as: DRISDOL 1 po q wed and 1 po q sun             Allergies:  Allergies       Allergies  Allergen Reactions   Geodon [Ziprasidone Hydrochloride] Other (See Comments)      Numbness ,sob, headaches, blurred vision   Sulfacetamide Sodium Hives   Ziprasidone Anaphylaxis      Other reaction(s): Other (See Comments) Numbness ,sob, headaches, blurred vision   Other reaction(s): Other (See Comments), Unknown Numbness ,sob, headaches, blurred vision Numbness ,sob, headaches,  blurred vision   Other reaction(s): Other (See Comments), Unknown Numbness ,sob, headaches, blurred vision Numbness ,sob, headaches, blurred vision     Numbness ,sob, headaches, blurred vision Other reaction(s): Other (See Comments), Unknown Numbness ,sob, headaches, blurred vision Numbness ,sob, headaches, blurred vision Other reaction(s): Other (See Comments), Unknown Numbness ,sob, headaches, blurred vision Numbness ,sob, headaches, blurred vision   Ziprasidone Hcl Anaphylaxis and Other (See Comments)      Other reaction(s): Other (See Comments), Unknown Numbness ,sob, headaches, blurred vision Numbness ,sob, headaches, blurred vision   Ace Inhibitors Rash   Erythromycin Rash and Hives   Hydromorphone Rash   Lamictal [Lamotrigine] Rash      Other reaction(s): Unknown   Strawberry (Diagnostic) Rash   Sulfa Antibiotics Hives and Rash      Other reaction(s): Unknown Other reaction(s): Unknown Other reaction(s): Unknown        Family History: Family History  Adopted: Yes  Family history unknown: Yes          Social History:  reports that she quit smoking about 3 years ago. Her smoking use included cigarettes. She started smoking about 14 years ago. She has a 5.00 pack-year smoking history. She has been exposed to tobacco smoke. She has quit using smokeless tobacco. She reports that she does not currently use alcohol. She reports current drug use. Drug: Other-see comments.   ROS:                           Physical Exam: There were no vitals taken for this visit.  Constitutional:  Alert and oriented, No acute distress. HEENT: [place] AT, moist mucus membranes.  Trachea midline, no masses.     Laboratory Data: Recent Labs       Lab Results  Component Value Date    WBC 8.9 10/08/2022    HGB 11.4 10/08/2022    HCT 34.4 10/08/2022    MCV 87 10/08/2022    PLT 323 06/13/2022        Recent Labs       Lab Results  Component Value Date    CREATININE 0.67  10/08/2022        Recent Labs  No results found for: "PSA"     Recent Labs  No results found for: "TESTOSTERONE"     Recent Labs       Lab Results  Component Value Date    HGBA1C 6.0 (H) 10/08/2022        Urinalysis Labs (Brief)          Component Value Date/Time    COLORURINE YELLOW (A) 02/25/2022 1435    APPEARANCEUR Cloudy (A) 10/01/2022 0920    LABSPEC 1.029 02/25/2022 1435    PHURINE 5.0 02/25/2022 1435    GLUCOSEU Negative 10/01/2022 0920    HGBUR NEGATIVE 02/25/2022 1435    BILIRUBINUR Negative 12/07/2022 1028    BILIRUBINUR Negative 10/01/2022 0920    KETONESUR NEGATIVE 02/25/2022 1435    PROTEINUR Negative 12/07/2022 1028    PROTEINUR 1+ (A) 10/01/2022 0920    PROTEINUR 30 (A) 02/25/2022 1435    UROBILINOGEN 0.2 12/07/2022 1028    UROBILINOGEN 1.0 06/18/2011 1258    NITRITE Negative 12/07/2022 1028    NITRITE Negative 10/01/2022 0920    NITRITE NEGATIVE 02/25/2022 1435    LEUKOCYTESUR Moderate (2+) (A) 12/07/2022 1028    LEUKOCYTESUR 1+ (A) 10/01/2022 0920    LEUKOCYTESUR TRACE (A) 02/25/2022 1435        Pertinent Imaging:     Assessment & Plan: Patient may or may not have a bladder infection.  If she does I will switch her prophylaxis and this was discussed.  Nocturia likely multifactorial due to overactive bladder and nocturnal diuresis and this was discussed.   Patient agreed that if the culture is positive I will treat it and then switch her prophylaxis.  Otherwise I will keep her on the daily Macrodantin which was renewed 30 x 11   We talked about replacing the battery versus replacing the entire device which is approximately 54 years old and she is willing to do either 1.  She understands advised to change the lead she may not get the same result.  The lead in a few years may have issues that would need subsequent changing.  I will speak to [employer] but were leaning towards just changing the battery.  Retained tip issue discussed.  Increased risk  of infection discussed.

## 2023-02-18 NOTE — Anesthesia Preprocedure Evaluation (Addendum)
Anesthesia Evaluation  Patient identified by MRN, date of birth, ID band Patient awake    Reviewed: Allergy & Precautions, NPO status , Patient's Chart, lab work & pertinent test results  History of Anesthesia Complications Negative for: history of anesthetic complications  Airway Mallampati: III  TM Distance: >3 FB Neck ROM: full    Dental  (+) Missing, Poor Dentition   Pulmonary asthma , former smoker   Pulmonary exam normal        Cardiovascular + Peripheral Vascular Disease and +CHF  Normal cardiovascular exam     Neuro/Psych  Headaches PSYCHIATRIC DISORDERS Anxiety Depression Bipolar Disorder    Neuromuscular disease    GI/Hepatic Neg liver ROS, hiatal hernia,GERD  ,,  Endo/Other  Hypothyroidism    Renal/GU Renal disease  negative genitourinary   Musculoskeletal   Abdominal   Peds  Hematology  (+) Blood dyscrasia, anemia   Anesthesia Other Findings Past Medical History: 2016: Acute deep vein thrombosis (DVT) of right lower extremity (HCC) No date: ADHD No date: Adopted No date: ANA positive No date: Anemia No date: Anxiety No date: Arthritis No date: Asthma No date: B12 deficiency No date: Bilateral hand pain No date: Bilateral swelling of feet and ankles No date: Bipolar 1 disorder (HCC) No date: Carpal tunnel syndrome of left wrist No date: Chest pain 09/07/2015: Chronic diarrhea No date: Chronic diastolic (congestive) heart failure (HCC) No date: Chronic left shoulder pain 04/26/2016: Chronic venous insufficiency 04/06/2019: Clostridioides difficile infection No date: Depression No date: Diverticulosis No date: Edema leg No date: GERD (gastroesophageal reflux disease) No date: Heart murmur 08/24/2015: Helicobacter pylori (H. pylori) infection No date: Hiatal hernia 09/09/2020: History of 2019 novel coronavirus disease (COVID-19)     Comment:  a.) PCR testing (+): 09/09/2020, 01/12/2021,  07/17/2021 No date: HLD (hyperlipidemia) No date: Hx MRSA infection No date: Hypothyroidism 10/01/2022: Influenza A No date: Knee effusion No date: Knee pain 05/11/2013: Lipoma of arm No date: Lower extremity edema No date: Lumbago 04/26/2016: Lymphedema No date: Migraine with aura No date: Multinodular goiter No date: Nephrolithiasis No date: Plantar fascial fibromatosis No date: Post traumatic stress disorder (PTSD) No date: Pre-diabetes No date: PVD (peripheral vascular disease) (HCC) No date: Seasonal allergies No date: Shortness of breath on exertion No date: Stress incontinence No date: Urge incontinence No date: Venous insufficiency No date: Venous ulcer (HCC) No date: Vitamin D deficiency  Past Surgical History: No date: ABDOMINAL HYSTERECTOMY No date: ANKLE SURGERY No date: CHOLECYSTECTOMY 06/24/2019: COLONOSCOPY WITH PROPOFOL; N/A     Comment:  Procedure: COLONOSCOPY WITH PROPOFOL;  Surgeon: Toledo,               Boykin Nearing, MD;  Location: [facility];  Service:               Gastroenterology;  Laterality: N/A; 04/09/2016: CYSTOSCOPY; N/A     Comment:  Procedure: CYSTOSCOPY;  Surgeon: Alfredo Martinez, MD;                Location: [facility];  Service: Urology;  Laterality: N/A; No date: DILATION AND CURETTAGE OF UTERUS 06/24/2019: ESOPHAGOGASTRODUODENOSCOPY (EGD) WITH PROPOFOL; N/A     Comment:  Procedure: ESOPHAGOGASTRODUODENOSCOPY (EGD) WITH               PROPOFOL;  Surgeon: Toledo, Boykin Nearing, MD;  Location:               [facility];  Service: Gastroenterology;  Laterality:  N/A; 04/18/2017: INGUINAL HERNIA REPAIR; Bilateral     Comment:  Procedure: LAPAROSCOPIC BILATERAL INGUINAL HERNIA               REPAIR;  Surgeon: Leafy Ro, MD;  Location: [facility]               [facility];  Service: General;  Laterality: Bilateral; 11/26/2016: Leane Platt IMPLANT PLACEMENT; N/A     Comment:  Procedure: Leane Platt IMPLANT FIRST STAGE;  Surgeon:                Alfredo Martinez, MD;  Location: [facility];  Service:               Urology;  Laterality: N/A; 11/26/2016: Leane Platt IMPLANT PLACEMENT; N/A     Comment:  Procedure: INTERSTIM IMPLANT SECOND STAGE;  Surgeon:               Alfredo Martinez, MD;  Location: [facility];  Service:               Urology;  Laterality: N/A; No date: JOINT REPLACEMENT; Right     Comment:  knee No date: KNEE ARTHROSCOPY; Bilateral 10/18/2015: KNEE ARTHROSCOPY WITH LATERAL RELEASE; Left     Comment:  Procedure: KNEE ARTHROSCOPY LATERAL AND PARTIAL               SYNOVECTOMY;  Surgeon: Kennedy Bucker, MD;  Location: [facility]               [facility];  Service: Orthopedics;  Laterality: Left; 2015: Lymph Node removal     Comment:  Neck 04/09/2016: PUBOVAGINAL SLING; N/A     Comment:  Procedure: PUBO-VAGINAL SLING/ RETROPUBIC SLING;                Surgeon: Alfredo Martinez, MD;  Location: [facility];                Service: Urology;  Laterality: N/A; No date: REPLACEMENT TOTAL KNEE; Right 2014: SHOULDER SURGERY; Right No date: TUBAL LIGATION No date: WRIST SURGERY; Right     Comment:  metal plate  BMI    Body Mass Index: 42.46 kg/m      Reproductive/Obstetrics negative OB ROS                             Anesthesia Physical Anesthesia Plan  ASA: 3  Anesthesia Plan: General   Post-op Pain Management: Ofirmev IV (intra-op)*   Induction: Intravenous  PONV Risk Score and Plan: 3 and Propofol infusion and TIVA  Airway Management Planned: Natural Airway and Nasal Cannula  Additional Equipment:   Intra-op Plan:   Post-operative Plan:   Informed Consent: I have reviewed the patients History and Physical, chart, labs and discussed the procedure including the risks, benefits and alternatives for the proposed anesthesia with the patient or authorized representative who has indicated his/her understanding and acceptance.     Dental Advisory Given  Plan Discussed with: Anesthesiologist, CRNA and  Surgeon  Anesthesia Plan Comments: (Patient consented for risks of anesthesia including but not limited to:  - adverse reactions to medications - risk of airway placement if required - damage to eyes, teeth, lips or other oral mucosa - nerve damage due to positioning  - sore throat or hoarseness - Damage to heart, brain, nerves, lungs, other parts of body or loss of life  Patient voiced understanding.)        Anesthesia Quick Evaluation

## 2023-02-18 NOTE — Discharge Instructions (Addendum)
Remove dressings on Saturday I have reviewed discharge instructions in detail with the patient. They will follow-up with me or their physician as scheduled. My nurse will also be calling the patients as per protocol.  Start ASA Wed  AMBULATORY SURGERY  DISCHARGE INSTRUCTIONS   The drugs that you were given will stay in your system until tomorrow so for the next 24 hours you should not:  Drive an automobile Make any legal decisions Drink any alcoholic beverage   You may resume regular meals tomorrow.  Today it is better to start with liquids and gradually work up to solid foods.  You may eat anything you prefer, but it is better to start with liquids, then soup and crackers, and gradually work up to solid foods.   Please notify your doctor immediately if you have any unusual bleeding, trouble breathing, redness and pain at the surgery site, drainage, fever, or pain not relieved by medication.    Additional Instructions:        Please contact your physician with any problems or Same Day Surgery at [phone], Monday through Friday 6 am to 4 pm, or [place] at [facility] number at [phone].

## 2023-02-19 ENCOUNTER — Encounter: Payer: Self-pay | Admitting: Urology

## 2023-02-19 ENCOUNTER — Telehealth: Payer: Self-pay

## 2023-02-19 NOTE — Telephone Encounter (Signed)
Post Surgery  Recent surgery:REMOVAL OF INTERSTIM IMPLANT  When: 02/18/2023 Pain: pt states she is doing well just c/o feeling sore and tender,  burning where the stiches are. Pt is taking tylonol  Signs and symptoms: Fever,chills: No  Nausea,vomiting: No Voiding: pt  states no issues urinating Hematuria:no Incomplete Emptying: yes  Plan:  Appointment w/Physician: post op follow up, August 19th  Pt asked for a work note to be off tomorrow. Please advise

## 2023-02-19 NOTE — Telephone Encounter (Signed)
Work note printed. Pt states she will come by and pick it up by the end of the day.

## 2023-02-20 ENCOUNTER — Encounter: Payer: Self-pay | Admitting: Urology

## 2023-02-25 ENCOUNTER — Other Ambulatory Visit: Payer: Self-pay | Admitting: Urology

## 2023-02-25 ENCOUNTER — Encounter: Payer: Self-pay | Admitting: Urology

## 2023-02-26 DIAGNOSIS — G8929 Other chronic pain: Secondary | ICD-10-CM | POA: Diagnosis not present

## 2023-02-26 DIAGNOSIS — Z79899 Other long term (current) drug therapy: Secondary | ICD-10-CM | POA: Diagnosis not present

## 2023-02-28 ENCOUNTER — Ambulatory Visit (INDEPENDENT_AMBULATORY_CARE_PROVIDER_SITE_OTHER): Payer: 59 | Admitting: Family Medicine

## 2023-03-01 ENCOUNTER — Other Ambulatory Visit: Payer: Self-pay

## 2023-03-01 ENCOUNTER — Emergency Department (HOSPITAL_COMMUNITY)
Admission: EM | Admit: 2023-03-01 | Discharge: 2023-03-02 | Disposition: A | Discharge status: Home / Self Care | Payer: 59 | Attending: Emergency Medicine | Admitting: Emergency Medicine

## 2023-03-01 ENCOUNTER — Ambulatory Visit: Payer: 59 | Admitting: Cardiology

## 2023-03-01 ENCOUNTER — Encounter (HOSPITAL_COMMUNITY): Payer: Self-pay

## 2023-03-01 DIAGNOSIS — R112 Nausea with vomiting, unspecified: Secondary | ICD-10-CM

## 2023-03-01 DIAGNOSIS — Z79899 Other long term (current) drug therapy: Secondary | ICD-10-CM | POA: Insufficient documentation

## 2023-03-01 DIAGNOSIS — R748 Abnormal levels of other serum enzymes: Secondary | ICD-10-CM | POA: Diagnosis not present

## 2023-03-01 DIAGNOSIS — E876 Hypokalemia: Secondary | ICD-10-CM | POA: Insufficient documentation

## 2023-03-01 DIAGNOSIS — R7309 Other abnormal glucose: Secondary | ICD-10-CM | POA: Diagnosis not present

## 2023-03-01 DIAGNOSIS — Z7984 Long term (current) use of oral hypoglycemic drugs: Secondary | ICD-10-CM | POA: Diagnosis not present

## 2023-03-01 DIAGNOSIS — Z7982 Long term (current) use of aspirin: Secondary | ICD-10-CM | POA: Diagnosis not present

## 2023-03-01 LAB — URINALYSIS, ROUTINE W REFLEX MICROSCOPIC
Bacteria, UA: NONE SEEN
Bilirubin Urine: NEGATIVE
Glucose, UA: NEGATIVE mg/dL
Hgb urine dipstick: NEGATIVE
Ketones, ur: NEGATIVE mg/dL
Nitrite: NEGATIVE
Protein, ur: NEGATIVE mg/dL
Specific Gravity, Urine: 1.016 (ref 1.005–1.030)
pH: 5 (ref 5.0–8.0)

## 2023-03-01 LAB — COMPREHENSIVE METABOLIC PANEL
ALT: 33 U/L (ref 0–44)
AST: 25 U/L (ref 15–41)
Albumin: 3.7 g/dL (ref 3.5–5.0)
Alkaline Phosphatase: 155 U/L — ABNORMAL HIGH (ref 38–126)
Anion gap: 7 (ref 5–15)
BUN: 12 mg/dL (ref 6–20)
CO2: 30 mmol/L (ref 22–32)
Calcium: 8.3 mg/dL — ABNORMAL LOW (ref 8.9–10.3)
Chloride: 100 mmol/L (ref 98–111)
Creatinine, Ser: 0.79 mg/dL (ref 0.44–1.00)
GFR, Estimated: >60 mL/min (ref >60)
Glucose, Bld: 118 mg/dL — ABNORMAL HIGH (ref 70–99)
Potassium: 3.3 mmol/L — ABNORMAL LOW (ref 3.5–5.1)
Sodium: 137 mmol/L (ref 135–145)
Total Bilirubin: 0.4 mg/dL (ref 0.3–1.2)
Total Protein: 7.3 g/dL (ref 6.5–8.1)

## 2023-03-01 LAB — CBC
HCT: 37.4 % (ref 36.0–46.0)
Hemoglobin: 11.7 g/dL — ABNORMAL LOW (ref 12.0–15.0)
MCH: 28.4 pg (ref 26.0–34.0)
MCHC: 31.3 g/dL (ref 30.0–36.0)
MCV: 90.8 fL (ref 80.0–100.0)
Platelets: 365 10*3/uL (ref 150–400)
RBC: 4.12 MIL/uL (ref 3.87–5.11)
RDW: 12.7 % (ref 11.5–15.5)
WBC: 7.1 10*3/uL (ref 4.0–10.5)
nRBC: 0 % (ref 0.0–0.2)

## 2023-03-01 LAB — LIPASE, BLOOD: Lipase: 22 U/L (ref 11–51)

## 2023-03-01 MED ORDER — POTASSIUM CHLORIDE CRYS ER 20 MEQ PO TBCR
40.0000 meq | EXTENDED_RELEASE_TABLET | Freq: Once | ORAL | Status: AC
  Administered 2023-03-01: 40 meq via ORAL
  Filled 2023-03-01: qty 2

## 2023-03-01 MED ORDER — SODIUM CHLORIDE 0.9 % IV BOLUS
1000.0000 mL | Freq: Once | INTRAVENOUS | Status: AC
  Administered 2023-03-01: 1000 mL via INTRAVENOUS

## 2023-03-01 MED ORDER — ONDANSETRON HCL 4 MG/2ML IJ SOLN
4.0000 mg | Freq: Once | INTRAMUSCULAR | Status: AC
  Administered 2023-03-01: 4 mg via INTRAVENOUS
  Filled 2023-03-01: qty 2

## 2023-03-01 NOTE — ED Provider Notes (Signed)
[place] EMERGENCY DEPARTMENT AT [facility] Provider Note   CSN: [id number] Arrival date & time: 03/01/23  2019     History {Add pertinent medical, surgical, social history, OB history to HPI:1} Chief Complaint  Patient presents with   Emesis    Alexis Christensen is a 52 y.o. female.  The history is provided by the patient.  Emesis She has a history of heart failure, bipolar disorder, DVT and comes in because of vomiting today.  Yesterday, she was bitten by an insect.  The bite was on her left upper arm.  Initially burned, now it is just itchy.  This morning, she started vomiting and has not been able to hold anything down.  She estimates 9 episodes of emesis.  She denies fever, chills, sweats.  She denies diarrhea.  She denies abdominal pain.   Home Medications Prior to Admission medications   Medication Sig Start Date End Date Taking? Authorizing Provider  albuterol (VENTOLIN HFA) 108 (90 Base) MCG/ACT inhaler Inhale 1 puff into the lungs every 6 (six) hours as needed for wheezing or shortness of breath.    [provider]  ARIPiprazole (ABILIFY) 30 MG tablet Take 30 mg by mouth daily. 09/04/22   [provider]  aspirin 81 MG chewable tablet Aspirin 81 MG Oral Tablet Chewable QTY: 30 tablet Days: 30 Refills: 0  Written: 01/23/21 Patient Instructions: once a day 01/23/21   [provider]  Budeson-Glycopyrrol-Formoterol (BREZTRI AEROSPHERE) 160-9-4.8 MCG/ACT AERO Inhale 2 puffs into the lungs in the morning and at bedtime.    [provider]  ciprofloxacin (CIPRO) 250 MG tablet Take 1 tablet (250 mg total) by mouth 2 (two) times daily. Patient not taking: Reported on 02/18/2023 12/24/22   Alfredo Martinez, MD  colestipol (COLESTID) 1 g tablet Take 2 tablets (2 g total) by mouth 2 (two) times daily. 12/26/22 06/24/23  Celso Amy, PA-C  cyanocobalamin (VITAMIN B12) 500 MCG tablet Take 1 tablet (500 mcg total) by mouth daily. 01/31/23   Opalski,  Gavin Pound, DO  dicyclomine (BENTYL) 10 MG capsule TAKE 1 CAPSULE BY MOUTH EVERY 8 HOURS AS NEEDED FOR ABDOMINAL PAIN 10/09/22   Orson Eva, NP  DULoxetine (CYMBALTA) 60 MG capsule Take 60 mg by mouth 2 (two) times daily.    [provider]  esomeprazole (NEXIUM) 40 MG capsule Take 1 capsule (40 mg total) by mouth 2 (two) times daily before a meal. 11/28/22 05/27/23  Celso Amy, PA-C  Evolocumab (REPATHA SURECLICK) 140 MG/ML SOAJ Inject into the skin.    [provider]  famotidine (PEPCID) 20 MG tablet Take 1 tablet (20 mg total) by mouth 2 (two) times daily. 11/28/22 05/27/23  Celso Amy, PA-C  fluticasone (FLONASE) 50 MCG/ACT nasal spray Place 1 spray into both nostrils daily. 02/12/23   Scoggins, Amber, NP  furosemide (LASIX) 40 MG tablet TAKE 1 TABLET BY MOUTH EVERY DAY 01/01/23   Adrian Blackwater A, MD  gabapentin (NEURONTIN) 600 MG tablet Take 600 mg by mouth 2 (two) times daily.     [provider]  HYDROcodone-acetaminophen (NORCO/VICODIN) 5-325 MG tablet Take 1-2 tablets by mouth every 6 (six) hours as needed for moderate pain. 02/18/23 02/18/24  Alfredo Martinez, MD  hydrOXYzine (ATARAX/VISTARIL) 25 MG tablet Take 50 mg by mouth every 8 (eight) hours as needed for itching.    [provider]  KLOR-CON M10 10 MEQ tablet Take 10 mEq by mouth daily. 03/22/22   [provider]  levothyroxine (SYNTHROID) 200 MCG  tablet PLEASE SEE ATTACHED FOR DETAILED DIRECTIONS 09/06/22   [provider]  liothyronine (CYTOMEL) 5 MCG tablet Take 10 mcg by mouth daily. 05/22/22 05/22/23  [provider]  lisinopril (ZESTRIL) 5 MG tablet TAKE 1 TABLET BY MOUTH EVERY DAY 11/06/22   Laurier Nancy, MD  loperamide (IMODIUM A-D) 2 MG tablet Take 2 tablets (4 mg total) by mouth in the morning, at noon, in the evening, and at bedtime. 12/26/22 03/26/23  Celso Amy, PA-C  meloxicam [facility]) 15 MG tablet Take by mouth. 12/11/22 12/11/23  [provider]   metFORMIN (GLUCOPHAGE) 500 MG tablet Take 1 tablet (500 mg total) by mouth 2 (two) times daily with a meal. 01/31/23   Opalski, Gavin Pound, DO  methocarbamol (ROBAXIN) 500 MG tablet Take 1 tablet (500 mg total) by mouth 3 (three) times daily. 11/19/22   Orson Eva, NP  metoprolol tartrate (LOPRESSOR) 50 MG tablet Take 50 mg by mouth daily in the afternoon.    [provider]  montelukast (SINGULAIR) 10 MG tablet Take 10 mg by mouth daily. 10/10/22   [provider]  nortriptyline (PAMELOR) 10 MG capsule Take 10 mg in the morning 09/11/22   [provider]  nystatin powder Apply 1 Application topically 3 (three) times daily. 10/23/22   Orson Eva, NP  rosuvastatin (CRESTOR) 40 MG tablet Take 40 mg by mouth daily.    [provider]  Semaglutide (RYBELSUS) 3 MG TABS Take 1 tablet (3 mg total) by mouth daily. 01/31/23   Opalski, Gavin Pound, DO  spironolactone (ALDACTONE) 25 MG tablet TAKE 1/2 (HALF) TABLET BY MOUTH ONCE DAILY. 09/04/22   Laurier Nancy, MD  SUBOXONE 8-2 MG FILM Dissolve 1 film under tongue twice daily 09/14/16   [provider]  Vitamin D, Ergocalciferol, (DRISDOL) 1.25 MG (50000 UNIT) CAPS capsule 1 po q wed and 1 po q sun 01/31/23   Opalski, Gavin Pound, DO      Allergies    Geodon [ziprasidone hydrochloride], Sulfacetamide sodium, Ziprasidone, Ziprasidone hcl, Ace inhibitors, Erythromycin, Hydromorphone, Lamictal [lamotrigine], Strawberry (diagnostic), and Sulfa antibiotics    Review of Systems   Review of Systems  Gastrointestinal:  Positive for vomiting.  All other systems reviewed and are negative.   Physical Exam Updated Vital Signs BP 117/81 (BP Location: Right Arm)   Pulse 92   Temp 98.3 F (36.8 C) (Oral)   Resp 18   Ht 5\' 4"  (1.626 m)   Wt 104.3 kg   SpO2 98%   BMI 39.48 kg/m  Physical Exam Vitals and nursing note reviewed.   52 year old female, resting comfortably and in no acute distress. Vital signs are normal.  Oxygen saturation is 98%, which is normal. Head is normocephalic and atraumatic. PERRLA, EOMI. Oropharynx is clear. Neck is nontender and supple without adenopathy or JVD. Back is nontender and there is no CVA tenderness. Lungs are clear without rales, wheezes, or rhonchi. Chest is nontender. Heart has regular rate and rhythm without murmur. Abdomen is soft, flat, nontender. Extremities have 1+ edema. Skin is warm and dry.  Area of erythema noted in the left upper arm consistent with insect bite or sting. Neurologic: Mental status is normal, cranial nerves are intact, there are no motor or sensory deficits.  ED Results / Procedures / Treatments   Labs (all labs ordered are listed, but only abnormal results are displayed) Labs Reviewed  COMPREHENSIVE METABOLIC PANEL - Abnormal; Notable for the following components:      Result Value  Potassium 3.3 (*)    Glucose, Bld 118 (*)    Calcium 8.3 (*)    Alkaline Phosphatase 155 (*)    All other components within normal limits  CBC - Abnormal; Notable for the following components:   Hemoglobin 11.7 (*)    All other components within normal limits  URINALYSIS, ROUTINE W REFLEX MICROSCOPIC - Abnormal; Notable for the following components:   APPearance HAZY (*)    Leukocytes,Ua LARGE (*)    All other components within normal limits  LIPASE, BLOOD   Procedures Procedures  {Document cardiac monitor, telemetry assessment procedure when appropriate:1}  Medications Ordered in ED Medications - No data to display  ED Course/ Medical Decision Making/ A&P   {   Click here for ABCD2, HEART and other calculatorsREFRESH Note before signing :1}                              Medical Decision Making  Nausea not likely to be due to insect bite.  More likely viral gastritis.  Doubt bowel obstruction.  I have reviewed and interpreted her laboratory test, my interpretation is stable anemia, mild hypokalemia likely secondary to emesis, elevated random  glucose level, elevated alkaline phosphatase which had been noted previously.  I have ordered IV fluids, ondansetron for nausea, oral potassium.  {Document critical care time when appropriate:1} {Document review of labs and clinical decision tools ie heart score, Chads2Vasc2 etc:1}  {Document your independent review of radiology images, and any outside records:1} {Document your discussion with family members, caretakers, and with consultants:1} {Document social determinants of health affecting pt's care:1} {Document your decision making why or why not admission, treatments were needed:1} Final Clinical Impression(s) / ED Diagnoses Final diagnoses:  None    Rx / DC Orders ED Discharge Orders     None

## 2023-03-01 NOTE — ED Triage Notes (Signed)
Pt. Arrives pov c/o and insect bite. She states after she got bit, there was swelling and heat to the site, that has since resided. She has also been vomiting nonstop since getting bit, and is unsure if they are related.

## 2023-03-02 MED ORDER — ONDANSETRON 8 MG PO TBDP: 8.0000 mg | ORAL_TABLET | Freq: Three times a day (TID) | ORAL | 0 refills | Status: DC | PRN

## 2023-03-02 MED ORDER — POTASSIUM CHLORIDE CRYS ER 20 MEQ PO TBCR: 20.0000 meq | EXTENDED_RELEASE_TABLET | Freq: Two times a day (BID) | ORAL | 0 refills | Status: DC

## 2023-03-04 ENCOUNTER — Ambulatory Visit: Payer: 59 | Admitting: Cardiology

## 2023-03-04 ENCOUNTER — Ambulatory Visit: Payer: 59 | Admitting: Urology

## 2023-03-04 ENCOUNTER — Encounter: Payer: Self-pay | Admitting: Urology

## 2023-03-04 ENCOUNTER — Ambulatory Visit: Payer: 59 | Admitting: Surgery

## 2023-03-04 VITALS — BP 119/79 | HR 87 | Ht 64.0 in | Wt 230.0 lb

## 2023-03-04 DIAGNOSIS — N3941 Urge incontinence: Secondary | ICD-10-CM

## 2023-03-04 DIAGNOSIS — N3946 Mixed incontinence: Secondary | ICD-10-CM

## 2023-03-04 LAB — MICROSCOPIC EXAMINATION: Epithelial Cells (non renal): >10 /hpf — AB (ref 0–10)

## 2023-03-04 LAB — URINALYSIS, COMPLETE
Bilirubin, UA: NEGATIVE
Glucose, UA: NEGATIVE
Nitrite, UA: NEGATIVE
Protein,UA: NEGATIVE
Specific Gravity, UA: >1.03 — ABNORMAL HIGH (ref 1.005–1.030)
Urobilinogen, Ur: 0.2 mg/dL (ref 0.2–1.0)
pH, UA: 5.5 (ref 5.0–7.5)

## 2023-03-04 MED ORDER — NITROFURANTOIN MONOHYD MACRO 100 MG PO CAPS: 100.0000 mg | ORAL_CAPSULE | Freq: Every day | ORAL | 3 refills | Status: DC

## 2023-03-04 NOTE — Progress Notes (Signed)
03/04/2023 3:27 PM   Alexis Christensen 1970-08-05 [id number]  Referring provider: Margaretann Loveless, MD [address] [place],  [place] [address]  Chief Complaint  Patient presents with   Follow-up    Post op    HPI: Reviewed note.  Patient had a revision of InterStim February 18, 2023.  She had aspiration with no clinical sequelae.  She went to the emergency room with nausea unrelated.  There was question of breakthrough infections the last time I saw her but I kept her on the daily  Macrodantin.  Culture was positive during that visit.  During surgery the patient aspirated and had to be intubated  She had not gone back on daily Macrodantin.  Clinically not infected.  Urine sent for culture.  Incisions look great.  Device had to be switched to other side to get best responses and I also put the IPG on the right to minimize risk of infection.   PMH: Past Medical History:  Diagnosis Date   Acute deep vein thrombosis (DVT) of right lower extremity (HCC) 2016   ADHD    Adopted    ANA positive    Anemia    Anxiety    Arthritis    Asthma    B12 deficiency    Bilateral hand pain    Bilateral swelling of feet and ankles    Bipolar 1 disorder (HCC)    Carpal tunnel syndrome of left wrist    Chest pain    Chronic diarrhea 09/07/2015   Chronic diastolic (congestive) heart failure (HCC)    Chronic left shoulder pain    Chronic venous insufficiency 04/26/2016   Clostridioides difficile infection 04/06/2019   Depression    Diverticulosis    Edema leg    GERD (gastroesophageal reflux disease)    Heart murmur    Helicobacter pylori (H. pylori) infection 08/24/2015   Hiatal hernia    History of 2019 novel coronavirus disease (COVID-19) 09/09/2020   a.) PCR testing (+): 09/09/2020, 01/12/2021, 07/17/2021   HLD (hyperlipidemia)    Hx MRSA infection    Hypothyroidism    Influenza A 10/01/2022   Knee effusion    Knee pain    Lipoma of arm 05/11/2013   Lower extremity edema     Lumbago    Lymphedema 04/26/2016   Migraine with aura    Multinodular goiter    Nephrolithiasis    Plantar fascial fibromatosis    Post traumatic stress disorder (PTSD)    Pre-diabetes    PVD (peripheral vascular disease) (HCC)    Seasonal allergies    Shortness of breath on exertion    Stress incontinence    Urge incontinence    Venous insufficiency    Venous ulcer (HCC)    Vitamin D deficiency     Surgical History: Past Surgical History:  Procedure Laterality Date   ABDOMINAL HYSTERECTOMY     ANKLE SURGERY     CHOLECYSTECTOMY     COLONOSCOPY WITH PROPOFOL N/A 06/24/2019   Procedure: COLONOSCOPY WITH PROPOFOL;  Surgeon: Toledo, Boykin Nearing, MD;  Location: [facility];  Service: Gastroenterology;  Laterality: N/A;   CYSTOSCOPY N/A 04/09/2016   Procedure: CYSTOSCOPY;  Surgeon: Alfredo Martinez, MD;  Location: [facility];  Service: Urology;  Laterality: N/A;   DILATION AND CURETTAGE OF UTERUS     ESOPHAGOGASTRODUODENOSCOPY (EGD) WITH PROPOFOL N/A 06/24/2019   Procedure: ESOPHAGOGASTRODUODENOSCOPY (EGD) WITH PROPOFOL;  Surgeon: Toledo, Boykin Nearing, MD;  Location: [facility];  Service: Gastroenterology;  Laterality: N/A;  INGUINAL HERNIA REPAIR Bilateral 04/18/2017   Procedure: LAPAROSCOPIC BILATERAL INGUINAL HERNIA REPAIR;  Surgeon: Leafy Ro, MD;  Location: [facility];  Service: General;  Laterality: Bilateral;   INTERSTIM IMPLANT PLACEMENT N/A 11/26/2016   Procedure: Leane Platt IMPLANT FIRST STAGE;  Surgeon: Alfredo Martinez, MD;  Location: [facility];  Service: Urology;  Laterality: N/A;   INTERSTIM IMPLANT PLACEMENT N/A 11/26/2016   Procedure: Leane Platt IMPLANT SECOND STAGE;  Surgeon: Alfredo Martinez, MD;  Location: [facility];  Service: Urology;  Laterality: N/A;   INTERSTIM IMPLANT PLACEMENT N/A 02/18/2023   Procedure: Leane Platt IMPLANT FIRST STAGE WITH IMPEDANCE CHECK;  Surgeon: Alfredo Martinez, MD;  Location: [facility];  Service: Urology;  Laterality: N/A;   INTERSTIM IMPLANT  PLACEMENT N/A 02/18/2023   Procedure: Leane Platt IMPLANT SECOND STAGE;  Surgeon: Alfredo Martinez, MD;  Location: [facility];  Service: Urology;  Laterality: N/A;   INTERSTIM IMPLANT REMOVAL N/A 02/18/2023   Procedure: REMOVAL OF INTERSTIM IMPLANT;  Surgeon: Alfredo Martinez, MD;  Location: [facility];  Service: Urology;  Laterality: N/A;   JOINT REPLACEMENT Right    knee   KNEE ARTHROSCOPY Bilateral    KNEE ARTHROSCOPY WITH LATERAL RELEASE Left 10/18/2015   Procedure: KNEE ARTHROSCOPY LATERAL AND PARTIAL SYNOVECTOMY;  Surgeon: Kennedy Bucker, MD;  Location: [facility];  Service: Orthopedics;  Laterality: Left;   Lymph Node removal  2015   Neck   PUBOVAGINAL SLING N/A 04/09/2016   Procedure: PUBO-VAGINAL SLING/ RETROPUBIC SLING;  Surgeon: Alfredo Martinez, MD;  Location: [facility];  Service: Urology;  Laterality: N/A;   REPLACEMENT TOTAL KNEE Right    SHOULDER SURGERY Right 2014   TUBAL LIGATION     WRIST SURGERY Right    metal plate    Home Medications:  Allergies as of 03/04/2023       Reactions   Geodon [ziprasidone Hydrochloride] Other (See Comments)   Numbness ,sob, headaches, blurred vision   Sulfacetamide Sodium Hives   Ziprasidone Anaphylaxis   Other reaction(s): Other (See Comments) Numbness ,sob, headaches, blurred vision Other reaction(s): Other (See Comments), Unknown Numbness ,sob, headaches, blurred vision Numbness ,sob, headaches, blurred vision Other reaction(s): Other (See Comments), Unknown Numbness ,sob, headaches, blurred vision Numbness ,sob, headaches, blurred vision Numbness ,sob, headaches, blurred vision Other reaction(s): Other (See Comments), Unknown Numbness ,sob, headaches, blurred vision Numbness ,sob, headaches, blurred vision Other reaction(s): Other (See Comments), Unknown Numbness ,sob, headaches, blurred vision Numbness ,sob, headaches, blurred vision   Ziprasidone Hcl Anaphylaxis, Other (See Comments)   Other reaction(s): Other (See Comments),  Unknown Numbness ,sob, headaches, blurred vision Numbness ,sob, headaches, blurred vision   Ace Inhibitors Rash   Erythromycin Rash, Hives   Hydromorphone Rash   Lamictal [lamotrigine] Rash   Other reaction(s): Unknown   Strawberry (diagnostic) Rash   Sulfa Antibiotics Hives, Rash   Other reaction(s): Unknown Other reaction(s): Unknown Other reaction(s): Unknown        Medication List        Accurate as of March 04, 2023  3:27 PM. If you have any questions, ask your nurse or doctor.          albuterol 108 (90 Base) MCG/ACT inhaler Commonly known as: VENTOLIN HFA Inhale 1 puff into the lungs every 6 (six) hours as needed for wheezing or shortness of breath.   ARIPiprazole 30 MG tablet Commonly known as: ABILIFY Take 30 mg by mouth daily.   aspirin 81 MG chewable tablet Aspirin 81 MG Oral Tablet Chewable QTY: 30 tablet Days: 30 Refills: 0  Written: 01/23/21  Patient Instructions: once a day   Breztri Aerosphere 160-9-4.8 MCG/ACT Aero Generic drug: Budeson-Glycopyrrol-Formoterol Inhale 2 puffs into the lungs in the morning and at bedtime.   colestipol 1 g tablet Commonly known as: COLESTID Take 2 tablets (2 g total) by mouth 2 (two) times daily.   cyanocobalamin 500 MCG tablet Commonly known as: VITAMIN B12 Take 1 tablet (500 mcg total) by mouth daily.   dicyclomine 10 MG capsule Commonly known as: BENTYL TAKE 1 CAPSULE BY MOUTH EVERY 8 HOURS AS NEEDED FOR ABDOMINAL PAIN   DULoxetine 60 MG capsule Commonly known as: CYMBALTA Take 60 mg by mouth 2 (two) times daily.   esomeprazole 40 MG capsule Commonly known as: NexIUM Take 1 capsule (40 mg total) by mouth 2 (two) times daily before a meal.   famotidine 20 MG tablet Commonly known as: PEPCID Take 1 tablet (20 mg total) by mouth 2 (two) times daily.   fluticasone 50 MCG/ACT nasal spray Commonly known as: FLONASE Place 1 spray into both nostrils daily.   furosemide 40 MG tablet Commonly known as:  LASIX TAKE 1 TABLET BY MOUTH EVERY DAY   gabapentin 600 MG tablet Commonly known as: NEURONTIN Take 600 mg by mouth 2 (two) times daily.   HYDROcodone-acetaminophen 5-325 MG tablet Commonly known as: NORCO/VICODIN Take 1-2 tablets by mouth every 6 (six) hours as needed for moderate pain.   hydrOXYzine 25 MG tablet Commonly known as: ATARAX Take 50 mg by mouth every 8 (eight) hours as needed for itching.   Klor-Con M10 10 MEQ tablet Generic drug: potassium chloride Take 10 mEq by mouth daily.   potassium chloride SA 20 MEQ tablet Commonly known as: KLOR-CON M Take 1 tablet (20 mEq total) by mouth 2 (two) times daily.   levothyroxine 200 MCG tablet Commonly known as: SYNTHROID PLEASE SEE ATTACHED FOR DETAILED DIRECTIONS   liothyronine 5 MCG tablet Commonly known as: CYTOMEL Take 10 mcg by mouth daily.   lisinopril 5 MG tablet Commonly known as: ZESTRIL TAKE 1 TABLET BY MOUTH EVERY DAY   loperamide 2 MG tablet Commonly known as: IMODIUM A-D Take 2 tablets (4 mg total) by mouth in the morning, at noon, in the evening, and at bedtime.   meloxicam 15 MG tablet Commonly known as: MOBIC Take by mouth.   metFORMIN 500 MG tablet Commonly known as: GLUCOPHAGE Take 1 tablet (500 mg total) by mouth 2 (two) times daily with a meal.   methocarbamol 500 MG tablet Commonly known as: ROBAXIN Take 1 tablet (500 mg total) by mouth 3 (three) times daily.   metoprolol tartrate 50 MG tablet Commonly known as: LOPRESSOR Take 50 mg by mouth daily in the afternoon.   montelukast 10 MG tablet Commonly known as: SINGULAIR Take 10 mg by mouth daily.   nortriptyline 10 MG capsule Commonly known as: PAMELOR Take 10 mg in the morning   nystatin powder Commonly known as: nystatin Apply 1 Application topically 3 (three) times daily.   ondansetron 8 MG disintegrating tablet Commonly known as: ZOFRAN-ODT Take 1 tablet (8 mg total) by mouth every 8 (eight) hours as needed for nausea  or vomiting.   Repatha SureClick 140 MG/ML Soaj Generic drug: Evolocumab Inject into the skin.   rosuvastatin 40 MG tablet Commonly known as: CRESTOR Take 40 mg by mouth daily.   Rybelsus 3 MG Tabs Generic drug: Semaglutide Take 1 tablet (3 mg total) by mouth daily.   spironolactone 25 MG tablet Commonly known as: ALDACTONE TAKE 1/2 (HALF)  TABLET BY MOUTH ONCE DAILY.   Suboxone 8-2 MG Film Generic drug: Buprenorphine HCl-Naloxone HCl Dissolve 1 film under tongue twice daily   Vitamin D (Ergocalciferol) 1.25 MG (50000 UNIT) Caps capsule Commonly known as: DRISDOL 1 po q wed and 1 po q sun        Allergies:  Allergies  Allergen Reactions   Geodon [Ziprasidone Hydrochloride] Other (See Comments)    Numbness ,sob, headaches, blurred vision   Sulfacetamide Sodium Hives   Ziprasidone Anaphylaxis    Other reaction(s): Other (See Comments) Numbness ,sob, headaches, blurred vision  Other reaction(s): Other (See Comments), Unknown Numbness ,sob, headaches, blurred vision Numbness ,sob, headaches, blurred vision  Other reaction(s): Other (See Comments), Unknown Numbness ,sob, headaches, blurred vision Numbness ,sob, headaches, blurred vision   Numbness ,sob, headaches, blurred vision Other reaction(s): Other (See Comments), Unknown Numbness ,sob, headaches, blurred vision Numbness ,sob, headaches, blurred vision Other reaction(s): Other (See Comments), Unknown Numbness ,sob, headaches, blurred vision Numbness ,sob, headaches, blurred vision   Ziprasidone Hcl Anaphylaxis and Other (See Comments)    Other reaction(s): Other (See Comments), Unknown Numbness ,sob, headaches, blurred vision Numbness ,sob, headaches, blurred vision   Ace Inhibitors Rash   Erythromycin Rash and Hives   Hydromorphone Rash   Lamictal [Lamotrigine] Rash    Other reaction(s): Unknown   Strawberry (Diagnostic) Rash   Sulfa Antibiotics Hives and Rash    Other reaction(s): Unknown Other  reaction(s): Unknown Other reaction(s): Unknown    Family History: Family History  Adopted: Yes  Family history unknown: Yes    Social History:  reports that she quit smoking about 3 years ago. Her smoking use included cigarettes. She started smoking about 14 years ago. She has a 5.5 pack-year smoking history. She has been exposed to tobacco smoke. She has quit using smokeless tobacco. She reports that she does not currently use alcohol. She reports that she does not currently use drugs.  ROS:                                        Physical Exam: There were no vitals taken for this visit.  Constitutional:  Alert and oriented, No acute distress. HEENT: [place] AT, moist mucus membranes.  Trachea midline, no masses.   Laboratory Data: Lab Results  Component Value Date   WBC 7.1 03/01/2023   HGB 11.7 (L) 03/01/2023   HCT 37.4 03/01/2023   MCV 90.8 03/01/2023   PLT 365 03/01/2023    Lab Results  Component Value Date   CREATININE 0.79 03/01/2023    No results found for: "PSA"  No results found for: "TESTOSTERONE"  Lab Results  Component Value Date   HGBA1C 6.2 (H) 02/05/2023    Urinalysis    Component Value Date/Time   COLORURINE YELLOW 03/01/2023 2151   APPEARANCEUR HAZY (A) 03/01/2023 2151   APPEARANCEUR Hazy (A) 12/17/2022 0829   LABSPEC 1.016 03/01/2023 2151   PHURINE 5.0 03/01/2023 2151   GLUCOSEU NEGATIVE 03/01/2023 2151   HGBUR NEGATIVE 03/01/2023 2151   BILIRUBINUR NEGATIVE 03/01/2023 2151   BILIRUBINUR Negative 12/17/2022 0829   KETONESUR NEGATIVE 03/01/2023 2151   PROTEINUR NEGATIVE 03/01/2023 2151   UROBILINOGEN 0.2 12/07/2022 1028   UROBILINOGEN 1.0 06/18/2011 1258   NITRITE NEGATIVE 03/01/2023 2151   LEUKOCYTESUR LARGE (A) 03/01/2023 2151    Pertinent Imaging:   Assessment & Plan: Send urine for culture.  Go date back on  daily Macrodantin.  There are some already working well with a bit of urgency.  See nurse practitioner  in a few weeks for further teaching.  She is pleased with her response  There are no diagnoses linked to this encounter.  No follow-ups on file.  Martina Sinner, MD  [facility] [address] [place], [place] [address] [phone]

## 2023-03-05 ENCOUNTER — Ambulatory Visit (INDEPENDENT_AMBULATORY_CARE_PROVIDER_SITE_OTHER): Payer: 59 | Admitting: Family Medicine

## 2023-03-06 ENCOUNTER — Ambulatory Visit (INDEPENDENT_AMBULATORY_CARE_PROVIDER_SITE_OTHER): Payer: 59 | Admitting: Family Medicine

## 2023-03-06 DIAGNOSIS — R202 Paresthesia of skin: Secondary | ICD-10-CM | POA: Diagnosis not present

## 2023-03-06 DIAGNOSIS — R200 Anesthesia of skin: Secondary | ICD-10-CM | POA: Diagnosis not present

## 2023-03-08 ENCOUNTER — Telehealth: Payer: Self-pay

## 2023-03-08 NOTE — Telephone Encounter (Signed)
Transition Care Management Unsuccessful Follow-up Telephone Call  Date of discharge and from where:  03/02/2023 [facility]  Attempts:  1st Attempt  Reason for unsuccessful TCM follow-up call:  Left voice message  Analisia Kingsford [name]  [facility] Population Health Community Resource Care Guide   [email or web address]  ?? [phone]   Website: [email or web address]  [email or web address]

## 2023-03-08 NOTE — Telephone Encounter (Signed)
Transition Care Management Follow-up Telephone Call Date of discharge and from where: 03/02/2023 [facility] How have you been since you were released from the hospital? Patient stated she is feeling better. Any questions or concerns? No  Items Reviewed: Did the pt receive and understand the discharge instructions provided? Yes  Medications obtained and verified? Yes  Other? No  Any new allergies since your discharge? No  Dietary orders reviewed? Yes Do you have support at home? Yes   Follow up appointments reviewed:  PCP Hospital f/u appt confirmed? No  Scheduled to see  on  @ . Specialist Hospital f/u appt confirmed? Yes  Scheduled to see Celso Amy, PA-C on 04/01/2023 @ [facility]. Are transportation arrangements needed? No  If their condition worsens, is the pt aware to call PCP or go to the Emergency Dept.? Yes Was the patient provided with contact information for the PCP's office or ED? Yes Was to pt encouraged to call back with questions or concerns? Yes  [facility]  [facility] Population Health Community Resource Care Guide   ??[email or web address]  ?? [phone]   Website: [email or web address]  [email or web address]

## 2023-03-12 DIAGNOSIS — Z79899 Other long term (current) drug therapy: Secondary | ICD-10-CM | POA: Diagnosis not present

## 2023-03-12 DIAGNOSIS — G8929 Other chronic pain: Secondary | ICD-10-CM | POA: Diagnosis not present

## 2023-03-14 ENCOUNTER — Encounter (INDEPENDENT_AMBULATORY_CARE_PROVIDER_SITE_OTHER): Payer: Self-pay | Admitting: Family Medicine

## 2023-03-14 ENCOUNTER — Other Ambulatory Visit (INDEPENDENT_AMBULATORY_CARE_PROVIDER_SITE_OTHER): Payer: Self-pay | Admitting: Family Medicine

## 2023-03-14 ENCOUNTER — Ambulatory Visit (INDEPENDENT_AMBULATORY_CARE_PROVIDER_SITE_OTHER): Payer: 59 | Admitting: Family Medicine

## 2023-03-14 VITALS — BP 114/79 | HR 82 | Temp 98.6°F | Ht 64.0 in | Wt 243.0 lb

## 2023-03-14 DIAGNOSIS — R7303 Prediabetes: Secondary | ICD-10-CM

## 2023-03-14 DIAGNOSIS — E559 Vitamin D deficiency, unspecified: Secondary | ICD-10-CM

## 2023-03-14 DIAGNOSIS — E669 Obesity, unspecified: Secondary | ICD-10-CM | POA: Diagnosis not present

## 2023-03-14 DIAGNOSIS — Z6837 Body mass index (BMI) 37.0-37.9, adult: Secondary | ICD-10-CM

## 2023-03-14 DIAGNOSIS — E538 Deficiency of other specified B group vitamins: Secondary | ICD-10-CM

## 2023-03-14 DIAGNOSIS — Z6841 Body Mass Index (BMI) 40.0 and over, adult: Secondary | ICD-10-CM

## 2023-03-14 MED ORDER — METFORMIN HCL 500 MG PO TABS
500.0000 mg | ORAL_TABLET | Freq: Two times a day (BID) | ORAL | 0 refills | Status: DC
Start: 2023-03-14 — End: 2023-05-02

## 2023-03-14 MED ORDER — VITAMIN D (ERGOCALCIFEROL) 1.25 MG (50000 UNIT) PO CAPS
ORAL_CAPSULE | ORAL | 0 refills | Status: DC
Start: 2023-03-14 — End: 2023-05-02

## 2023-03-14 MED ORDER — CYANOCOBALAMIN 500 MCG PO TABS
500.0000 ug | ORAL_TABLET | Freq: Every day | ORAL | Status: DC
Start: 2023-03-14 — End: 2023-10-23

## 2023-03-14 MED ORDER — RYBELSUS 3 MG PO TABS
3.0000 mg | ORAL_TABLET | Freq: Every day | ORAL | 0 refills | Status: DC
Start: 2023-03-14 — End: 2023-05-02

## 2023-03-14 NOTE — Progress Notes (Signed)
Alexis Christensen, D.O.  ABFM, ABOM Specializing in Clinical Bariatric Medicine  Office located at: [address] [place], [place]  [address]     Assessment and Plan:   Medications Discontinued During This Encounter  Medication Reason   fluticasone (FLONASE) 50 MCG/ACT nasal spray Patient Preference   HYDROcodone-acetaminophen (NORCO/VICODIN) 5-325 MG tablet Patient Preference   ondansetron (ZOFRAN-ODT) 8 MG disintegrating tablet Patient Preference   metFORMIN (GLUCOPHAGE) 500 MG tablet Reorder   Vitamin D, Ergocalciferol, (DRISDOL) 1.25 MG (50000 UNIT) CAPS capsule Reorder   Semaglutide (RYBELSUS) 3 MG TABS Reorder   cyanocobalamin (VITAMIN B12) 500 MCG tablet Reorder     Meds ordered this encounter  Medications   metFORMIN (GLUCOPHAGE) 500 MG tablet    Sig: Take 1 tablet (500 mg total) by mouth 2 (two) times daily with a meal.    Dispense:  60 tablet    Refill:  0    30 d supply;  ** OV for RF **   Do not send RF request   Semaglutide (RYBELSUS) 3 MG TABS    Sig: Take 1 tablet (3 mg total) by mouth daily.    Dispense:  30 tablet    Refill:  0   Vitamin D, Ergocalciferol, (DRISDOL) 1.25 MG (50000 UNIT) CAPS capsule    Sig: 1 po q wed and 1 po q sun    Dispense:  8 capsule    Refill:  0    30 d supply;  ** OV for RF **   Do not send RF request   cyanocobalamin (VITAMIN B12) 500 MCG tablet    Sig: Take 1 tablet (500 mcg total) by mouth daily.     Pre-diabetes Assessment & Plan: Lab Results  Component Value Date   HGBA1C 6.2 (H) 02/05/2023   HGBA1C 6.1 (H) 01/03/2023   HGBA1C 6.0 (H) 10/08/2022   INSULIN 13.8 02/14/2022   INSULIN 6.4 08/01/2021   INSULIN 7.4 03/28/2021    Prediabetes treated with Rybelsus 3 mg daily & Metformin 500 mg bid. Hunger and cravings are better controlled when following her prudent nutritional plan. C/w both medications - Will refill Rybelsus & Metformin today. Continue to decrease simple carbs/ sugars; increase fiber and proteins ->  follow her meal plan.     Vitamin D deficiency Assessment & Plan: Lab Results  Component Value Date   VD25OH 19.1 (L) 01/03/2023   VD25OH 16.2 (L) 07/03/2022   VD25OH 19.6 (L) 02/14/2022   Has been compliant and tolerant with ERGO 50,000 units twice wkly. Continue with their weight loss efforts and ERGO at current dose - Will refill today. Will recheck labs as deemed appropriate.   B12 deficiency Assessment & Plan: Lab Results  Component Value Date   VITAMINB12 305 01/03/2023   No issues with OTC B12 500 mcg daily, tolerating well. C/w supplement and our B12 rich diet. We will continue to monitor as deemed clinically necessary.   BMI 37.0-37.9, adult Obesity,current BMI 41.69 Assessment & Plan: Since last office visit on 01/31/23 patient's  Muscle mass has decreased by 0.4 lb. Fat mass has increased by 6 lb. Counseling done on how various foods will affect these numbers and how to maximize success  Total lbs lost to date: 14 lbs  Total weight loss percentage to date: 5.76%  Meal Plan: Begin Journaling 1,000-1,100 calories and 80+ grams protein with the Category 1 meal plan as a guide.   I discussed with pt the potential benefits of tracking the foods  they eat and drink. Journaling can help one further understand their eating habits and patterns and it's a great tool to make more conscious meal choices throughout the day.    Behavioral Intervention Additional resources provided today: category 1 meal plan information Evidence-based interventions for health behavior change were utilized today including the discussion of self monitoring techniques, problem-solving barriers and SMART goal setting techniques.   Regarding patient's less desirable eating habits and patterns, we employed the technique of small changes.  Pt will specifically work on: journaling her intake/bringing in food log for next visit.    FOLLOW UP: Return in about 4 weeks (around 04/11/2023). She was informed of  the importance of frequent follow up visits to maximize her success with intensive lifestyle modifications for her multiple health conditions.  Subjective:   Chief complaint: Obesity Alexis Christensen is here to discuss her progress with her obesity treatment plan. She is on the Category 1 Plan with B/L options and states she is following her eating plan approximately 90% (after food recall appears to be more like 60%)  of the time. She states she is walking 20-30 minutes 3 days per week.  Interval History:  Alexis Christensen is here for a follow up office visit. For breakfast today, she had 2 scrambled eggs and a cup of juice. For lunch today, she had a [place] sandwich with a small portion of [place] (did not measure amount). Has  been eating foods prepared by others because she's been taking care of her sister. She drinks roughly 6-8 bottles of water daily.   Pharmacotherapy for weight loss: She is currently taking  Rybelsus 3 mg daily & Metformin 500 mg bid   for medical weight loss.  Denies side effects.    Review of Systems:  Pertinent positives were addressed with patient today.  Reviewed by clinician on day of visit: allergies, medications, problem list, medical history, surgical history, family history, social history, and previous encounter notes.  Weight Summary and Biometrics   Weight Lost Since Last Visit: 0lb  Weight Gained Since Last Visit: 6lb   Vitals Temp: 98.6 F (37 C) BP: 114/79 Pulse Rate: 82 SpO2: 97 %   Anthropometric Measurements Height: 5\' 4"  (1.626 m) Weight: 243 lb (110.2 kg) BMI (Calculated): 41.69 Weight at Last Visit: 237lb Weight Lost Since Last Visit: 0lb Weight Gained Since Last Visit: 6lb Starting Weight: 229lb Total Weight Loss (lbs): 0 lb (0 kg)   Body Composition  Body Fat %: 53 % Fat Mass (lbs): 128.8 lbs Muscle Mass (lbs): 108.6 lbs Visceral Fat Rating : 17   Other Clinical Data Fasting: no Labs: no Today's Visit #: 25 Starting Date:  03/28/21   Objective:   PHYSICAL EXAM: Blood pressure 114/79, pulse 82, temperature 98.6 F (37 C), height 5\' 4"  (1.626 m), weight 243 lb (110.2 kg), SpO2 97%. Body mass index is 41.71 kg/m.  General: Well Developed, well nourished, and in no acute distress.  HEENT: Normocephalic, atraumatic Skin: Warm and dry, cap RF less 2 sec, good turgor Chest:  Normal excursion, shape, no gross abn Respiratory: speaking in full sentences, no conversational dyspnea NeuroM-Sk: Ambulates w/o assistance, moves * 4 Psych: A and O *3, insight good, mood-full  DIAGNOSTIC DATA REVIEWED:  BMET    Component Value Date/Time   NA 137 03/01/2023 2138   NA 139 02/05/2023 1127   NA 138 11/07/2012 0517   K 3.3 (L) 03/01/2023 2138   K 3.9 11/07/2012 0517   CL 100 03/01/2023 2138  CL 109 (H) 11/07/2012 0517   CO2 30 03/01/2023 2138   CO2 25 11/07/2012 0517   GLUCOSE 118 (H) 03/01/2023 2138   GLUCOSE 91 11/07/2012 0517   BUN 12 03/01/2023 2138   BUN 10 02/05/2023 1127   BUN 7 11/07/2012 0517   CREATININE 0.79 03/01/2023 2138   CREATININE 0.76 11/07/2012 0517   CALCIUM 8.3 (L) 03/01/2023 2138   CALCIUM 7.9 (L) 11/07/2012 0517   GFRNONAA >60 03/01/2023 2138   GFRNONAA >60 11/07/2012 0517   GFRAA >60 02/17/2020 0000   GFRAA >60 11/07/2012 0517   Lab Results  Component Value Date   HGBA1C 6.2 (H) 02/05/2023   HGBA1C 6.0 03/06/2021   Lab Results  Component Value Date   INSULIN 13.8 02/14/2022   INSULIN 7.4 03/28/2021   Lab Results  Component Value Date   TSH 12.400 (H) 02/05/2023   CBC    Component Value Date/Time   WBC 7.1 03/01/2023 2138   RBC 4.12 03/01/2023 2138   HGB 11.7 (L) 03/01/2023 2138   HGB 11.8 02/05/2023 1127   HCT 37.4 03/01/2023 2138   HCT 36.0 02/05/2023 1127   PLT 365 03/01/2023 2138   PLT 209 11/07/2012 0517   MCV 90.8 03/01/2023 2138   MCV 88 02/05/2023 1127   MCV 89 10/20/2012 1448   MCH 28.4 03/01/2023 2138   MCHC 31.3 03/01/2023 2138   RDW 12.7  03/01/2023 2138   RDW 13.0 02/05/2023 1127   RDW 13.0 10/20/2012 1448   Iron Studies    Component Value Date/Time   IRON 39 07/13/2016 1055   TIBC 408 07/13/2016 1055   FERRITIN 11 07/13/2016 1055   IRONPCTSAT 10 (L) 07/13/2016 1055   Lipid Panel     Component Value Date/Time   CHOL 185 02/05/2023 1127   TRIG 235 (H) 02/05/2023 1127   HDL 48 02/05/2023 1127   CHOLHDL 3.9 02/05/2023 1127   LDLCALC 97 02/05/2023 1127   Hepatic Function Panel     Component Value Date/Time   PROT 7.3 03/01/2023 2138   PROT 6.2 02/05/2023 1127   ALBUMIN 3.7 03/01/2023 2138   ALBUMIN 3.7 (L) 02/05/2023 1127   AST 25 03/01/2023 2138   ALT 33 03/01/2023 2138   ALKPHOS 155 (H) 03/01/2023 2138   BILITOT 0.4 03/01/2023 2138   BILITOT 0.3 02/05/2023 1127      Component Value Date/Time   TSH 12.400 (H) 02/05/2023 1127   Nutritional Lab Results  Component Value Date   VD25OH 19.1 (L) 01/03/2023   VD25OH 16.2 (L) 07/03/2022   VD25OH 19.6 (L) 02/14/2022    Attestations:   I, Special Puri, acting as a Stage manager for Thomasene Lot, DO., have compiled all relevant documentation for today's office visit on behalf of Thomasene Lot, DO, while in the presence of [employer], DO.  I have reviewed the above documentation for accuracy and completeness, and I agree with the above. Alexis Christensen, D.O.  The 21st Century Cures Act was signed into law in 2016 which includes the topic of electronic health records.  This provides immediate access to information in MyChart.  This includes consultation notes, operative notes, office notes, lab results and pathology reports.  If you have any questions about what you read please let us know at your next visit so we can discuss your concerns and take corrective action if need be.  We are right here with you.

## 2023-03-24 DIAGNOSIS — Z888 Allergy status to other drugs, medicaments and biological substances status: Secondary | ICD-10-CM | POA: Diagnosis not present

## 2023-03-24 DIAGNOSIS — Z91018 Allergy to other foods: Secondary | ICD-10-CM | POA: Diagnosis not present

## 2023-03-24 DIAGNOSIS — M1990 Unspecified osteoarthritis, unspecified site: Secondary | ICD-10-CM | POA: Diagnosis not present

## 2023-03-24 DIAGNOSIS — Z79899 Other long term (current) drug therapy: Secondary | ICD-10-CM | POA: Diagnosis not present

## 2023-03-24 DIAGNOSIS — E785 Hyperlipidemia, unspecified: Secondary | ICD-10-CM | POA: Diagnosis not present

## 2023-03-24 DIAGNOSIS — J45909 Unspecified asthma, uncomplicated: Secondary | ICD-10-CM | POA: Diagnosis not present

## 2023-03-24 DIAGNOSIS — Z882 Allergy status to sulfonamides status: Secondary | ICD-10-CM | POA: Diagnosis not present

## 2023-03-24 DIAGNOSIS — M25521 Pain in right elbow: Secondary | ICD-10-CM | POA: Diagnosis not present

## 2023-03-24 DIAGNOSIS — E039 Hypothyroidism, unspecified: Secondary | ICD-10-CM | POA: Diagnosis not present

## 2023-03-24 DIAGNOSIS — K219 Gastro-esophageal reflux disease without esophagitis: Secondary | ICD-10-CM | POA: Diagnosis not present

## 2023-03-24 DIAGNOSIS — Z043 Encounter for examination and observation following other accident: Secondary | ICD-10-CM | POA: Diagnosis not present

## 2023-03-24 DIAGNOSIS — Z87891 Personal history of nicotine dependence: Secondary | ICD-10-CM | POA: Diagnosis not present

## 2023-03-26 DIAGNOSIS — Z79899 Other long term (current) drug therapy: Secondary | ICD-10-CM | POA: Diagnosis not present

## 2023-03-26 DIAGNOSIS — G8929 Other chronic pain: Secondary | ICD-10-CM | POA: Diagnosis not present

## 2023-03-27 ENCOUNTER — Other Ambulatory Visit: Payer: Self-pay

## 2023-04-01 ENCOUNTER — Ambulatory Visit: Payer: 59 | Admitting: Physician Assistant

## 2023-04-11 ENCOUNTER — Ambulatory Visit (INDEPENDENT_AMBULATORY_CARE_PROVIDER_SITE_OTHER): Payer: 59 | Admitting: Family Medicine

## 2023-04-11 ENCOUNTER — Ambulatory Visit (INDEPENDENT_AMBULATORY_CARE_PROVIDER_SITE_OTHER): Payer: 59 | Admitting: Cardiovascular Disease

## 2023-04-11 ENCOUNTER — Encounter: Payer: Self-pay | Admitting: Cardiovascular Disease

## 2023-04-11 VITALS — BP 130/92 | HR 67 | Ht 64.0 in | Wt 243.6 lb

## 2023-04-11 DIAGNOSIS — R0602 Shortness of breath: Secondary | ICD-10-CM | POA: Diagnosis not present

## 2023-04-11 DIAGNOSIS — I5030 Unspecified diastolic (congestive) heart failure: Secondary | ICD-10-CM

## 2023-04-11 DIAGNOSIS — I340 Nonrheumatic mitral (valve) insufficiency: Secondary | ICD-10-CM | POA: Diagnosis not present

## 2023-04-11 DIAGNOSIS — E782 Mixed hyperlipidemia: Secondary | ICD-10-CM

## 2023-04-11 MED ORDER — EMPAGLIFLOZIN 25 MG PO TABS: 25.0000 mg | ORAL_TABLET | Freq: Every day | ORAL | 3 refills | Status: DC

## 2023-04-11 MED ORDER — LISINOPRIL 10 MG PO TABS: 10.0000 mg | ORAL_TABLET | Freq: Every day | ORAL | 11 refills | Status: DC

## 2023-04-11 NOTE — Progress Notes (Signed)
Cardiology Office Note   Date:  04/11/2023   ID:  Alexis Christensen, DOB [birth year], MRN [id number]  PCP:  Miki Kins, FNP  Cardiologist:  Adrian Blackwater, MD      History of Present Illness: Alexis Christensen is a 52 y.o. female who presents for No chief complaint on file.   Blurring of vision      Past Medical History:  Diagnosis Date   Acute deep vein thrombosis (DVT) of right lower extremity (HCC) 2016   ADHD    Adopted    ANA positive    Anemia    Anxiety    Arthritis    Asthma    B12 deficiency    Bilateral hand pain    Bilateral swelling of feet and ankles    Bipolar 1 disorder (HCC)    Carpal tunnel syndrome of left wrist    Chest pain    Chronic diarrhea 09/07/2015   Chronic diastolic (congestive) heart failure (HCC)    Chronic left shoulder pain    Chronic venous insufficiency 04/26/2016   Clostridioides difficile infection 04/06/2019   Depression    Diverticulosis    Edema leg    GERD (gastroesophageal reflux disease)    Heart murmur    Helicobacter pylori (H. pylori) infection 08/24/2015   Hiatal hernia    History of 2019 novel coronavirus disease (COVID-19) 09/09/2020   a.) PCR testing (+): 09/09/2020, 01/12/2021, 07/17/2021   HLD (hyperlipidemia)    Hx MRSA infection    Hypothyroidism    Influenza A 10/01/2022   Knee effusion    Knee pain    Lipoma of arm 05/11/2013   Lower extremity edema    Lumbago    Lymphedema 04/26/2016   Migraine with aura    Multinodular goiter    Nephrolithiasis    Plantar fascial fibromatosis    Post traumatic stress disorder (PTSD)    Pre-diabetes    PVD (peripheral vascular disease) (HCC)    Seasonal allergies    Shortness of breath on exertion    Stress incontinence    Urge incontinence    Venous insufficiency    Venous ulcer (HCC)    Vitamin D deficiency      Past Surgical History:  Procedure Laterality Date   ABDOMINAL HYSTERECTOMY     ANKLE SURGERY     CHOLECYSTECTOMY     COLONOSCOPY WITH  PROPOFOL N/A 06/24/2019   Procedure: COLONOSCOPY WITH PROPOFOL;  Surgeon: Toledo, Boykin Nearing, MD;  Location: [facility];  Service: Gastroenterology;  Laterality: N/A;   CYSTOSCOPY N/A 04/09/2016   Procedure: CYSTOSCOPY;  Surgeon: Alfredo Martinez, MD;  Location: [facility];  Service: Urology;  Laterality: N/A;   DILATION AND CURETTAGE OF UTERUS     ESOPHAGOGASTRODUODENOSCOPY (EGD) WITH PROPOFOL N/A 06/24/2019   Procedure: ESOPHAGOGASTRODUODENOSCOPY (EGD) WITH PROPOFOL;  Surgeon: Toledo, Boykin Nearing, MD;  Location: [facility];  Service: Gastroenterology;  Laterality: N/A;   INGUINAL HERNIA REPAIR Bilateral 04/18/2017   Procedure: LAPAROSCOPIC BILATERAL INGUINAL HERNIA REPAIR;  Surgeon: Leafy Ro, MD;  Location: [facility];  Service: General;  Laterality: Bilateral;   INTERSTIM IMPLANT PLACEMENT N/A 11/26/2016   Procedure: Leane Platt IMPLANT FIRST STAGE;  Surgeon: Alfredo Martinez, MD;  Location: [facility];  Service: Urology;  Laterality: N/A;   INTERSTIM IMPLANT PLACEMENT N/A 11/26/2016   Procedure: Leane Platt IMPLANT SECOND STAGE;  Surgeon: Alfredo Martinez, MD;  Location: [facility];  Service: Urology;  Laterality: N/A;   INTERSTIM IMPLANT PLACEMENT N/A 02/18/2023   Procedure:  INTERSTIM IMPLANT FIRST STAGE WITH IMPEDANCE CHECK;  Surgeon: Alfredo Martinez, MD;  Location: [facility];  Service: Urology;  Laterality: N/A;   INTERSTIM IMPLANT PLACEMENT N/A 02/18/2023   Procedure: Leane Platt IMPLANT SECOND STAGE;  Surgeon: Alfredo Martinez, MD;  Location: [facility];  Service: Urology;  Laterality: N/A;   INTERSTIM IMPLANT REMOVAL N/A 02/18/2023   Procedure: REMOVAL OF INTERSTIM IMPLANT;  Surgeon: Alfredo Martinez, MD;  Location: [facility];  Service: Urology;  Laterality: N/A;   JOINT REPLACEMENT Right    knee   KNEE ARTHROSCOPY Bilateral    KNEE ARTHROSCOPY WITH LATERAL RELEASE Left 10/18/2015   Procedure: KNEE ARTHROSCOPY LATERAL AND PARTIAL SYNOVECTOMY;  Surgeon: Kennedy Bucker, MD;  Location: [facility];   Service: Orthopedics;  Laterality: Left;   Lymph Node removal  2015   Neck   PUBOVAGINAL SLING N/A 04/09/2016   Procedure: PUBO-VAGINAL SLING/ RETROPUBIC SLING;  Surgeon: Alfredo Martinez, MD;  Location: [facility];  Service: Urology;  Laterality: N/A;   REPLACEMENT TOTAL KNEE Right    SHOULDER SURGERY Right 2014   TUBAL LIGATION     WRIST SURGERY Right    metal plate     Current Outpatient Medications  Medication Sig Dispense Refill   celecoxib (CELEBREX) 100 MG capsule Take 100 mg by mouth 2 (two) times daily.     albuterol (VENTOLIN HFA) 108 (90 Base) MCG/ACT inhaler Inhale 1 puff into the lungs every 6 (six) hours as needed for wheezing or shortness of breath.     ARIPiprazole (ABILIFY) 30 MG tablet Take 30 mg by mouth daily.     aspirin 81 MG chewable tablet Aspirin 81 MG Oral Tablet Chewable QTY: 30 tablet Days: 30 Refills: 0  Written: 01/23/21 Patient Instructions: once a day     Budeson-Glycopyrrol-Formoterol (BREZTRI AEROSPHERE) 160-9-4.8 MCG/ACT AERO Inhale 2 puffs into the lungs in the morning and at bedtime.     colestipol (COLESTID) 1 g tablet Take 2 tablets (2 g total) by mouth 2 (two) times daily. 120 tablet 5   cyanocobalamin (VITAMIN B12) 500 MCG tablet Take 1 tablet (500 mcg total) by mouth daily.     dicyclomine (BENTYL) 10 MG capsule TAKE 1 CAPSULE BY MOUTH EVERY 8 HOURS AS NEEDED FOR ABDOMINAL PAIN 270 capsule 3   DULoxetine (CYMBALTA) 60 MG capsule Take 60 mg by mouth 2 (two) times daily.     esomeprazole (NEXIUM) 40 MG capsule Take 1 capsule (40 mg total) by mouth 2 (two) times daily before a meal. 180 capsule 1   Evolocumab (REPATHA SURECLICK) 140 MG/ML SOAJ Inject into the skin.     famotidine (PEPCID) 20 MG tablet Take 1 tablet (20 mg total) by mouth 2 (two) times daily. 180 tablet 1   furosemide (LASIX) 40 MG tablet TAKE 1 TABLET BY MOUTH EVERY DAY 30 tablet 2   gabapentin (NEURONTIN) 600 MG tablet Take 600 mg by mouth 2 (two) times daily.      hydrOXYzine  (ATARAX/VISTARIL) 25 MG tablet Take 50 mg by mouth every 8 (eight) hours as needed for itching.     KLOR-CON M10 10 MEQ tablet Take 10 mEq by mouth daily.     levothyroxine (SYNTHROID) 200 MCG tablet PLEASE SEE ATTACHED FOR DETAILED DIRECTIONS     liothyronine (CYTOMEL) 5 MCG tablet Take 10 mcg by mouth daily.     lisinopril (ZESTRIL) 5 MG tablet TAKE 1 TABLET BY MOUTH EVERY DAY 90 tablet 1   meloxicam (MOBIC) 15 MG tablet Take by mouth.  metFORMIN (GLUCOPHAGE) 500 MG tablet Take 1 tablet (500 mg total) by mouth 2 (two) times daily with a meal. 60 tablet 0   methocarbamol (ROBAXIN) 500 MG tablet Take 1 tablet (500 mg total) by mouth 3 (three) times daily. 30 tablet 0   metoprolol tartrate (LOPRESSOR) 50 MG tablet Take 50 mg by mouth daily in the afternoon.     montelukast (SINGULAIR) 10 MG tablet Take 10 mg by mouth daily.     nitrofurantoin, macrocrystal-monohydrate, (MACROBID) 100 MG capsule Take 1 capsule (100 mg total) by mouth daily. 90 capsule 3   nortriptyline (PAMELOR) 10 MG capsule Take 10 mg in the morning     nystatin powder Apply 1 Application topically 3 (three) times daily. 15 g 3   potassium chloride SA (KLOR-CON M) 20 MEQ tablet Take 1 tablet (20 mEq total) by mouth 2 (two) times daily. 6 tablet 0   rosuvastatin (CRESTOR) 40 MG tablet Take 40 mg by mouth daily.     Semaglutide (RYBELSUS) 3 MG TABS Take 1 tablet (3 mg total) by mouth daily. 30 tablet 0   spironolactone (ALDACTONE) 25 MG tablet TAKE 1/2 (HALF) TABLET BY MOUTH ONCE DAILY. 45 tablet 1   SUBOXONE 8-2 MG FILM Dissolve 1 film under tongue twice daily  0   Vitamin D, Ergocalciferol, (DRISDOL) 1.25 MG (50000 UNIT) CAPS capsule 1 po q wed and 1 po q sun 8 capsule 0   No current facility-administered medications for this visit.    Allergies:   Geodon [ziprasidone hydrochloride], Sulfacetamide sodium, Ziprasidone, Ziprasidone hcl, Ace inhibitors, Erythromycin, Hydromorphone, Lamictal [lamotrigine], Strawberry  (diagnostic), and Sulfa antibiotics    Social History:   reports that she quit smoking about 3 years ago. Her smoking use included cigarettes. She started smoking about 14 years ago. She has a 5.5 pack-year smoking history. She has been exposed to tobacco smoke. She has quit using smokeless tobacco. She reports that she does not currently use alcohol. She reports that she does not currently use drugs.   Family History:  She was adopted. Family history is unknown by patient.    ROS:     Review of Systems  Constitutional: Negative.   HENT: Negative.    Eyes: Negative.   Respiratory: Negative.    Gastrointestinal: Negative.   Genitourinary: Negative.   Musculoskeletal: Negative.   Skin: Negative.   Neurological: Negative.   Endo/Heme/Allergies: Negative.   Psychiatric/Behavioral: Negative.    All other systems reviewed and are negative.     All other systems are reviewed and negative.    PHYSICAL EXAM: VS:  BP (!) 130/92 (BP Location: Left Arm)   Pulse 67   Ht 5\' 4"  (1.626 m)   Wt 243 lb 9.6 oz (110.5 kg)   SpO2 98%   BMI 41.81 kg/m  , BMI Body mass index is 41.81 kg/m. Last weight:  Wt Readings from Last 3 Encounters:  04/11/23 243 lb 9.6 oz (110.5 kg)  03/14/23 243 lb (110.2 kg)  03/04/23 230 lb (104.3 kg)     Physical Exam Constitutional:      Appearance: Normal appearance.  Cardiovascular:     Rate and Rhythm: Normal rate and regular rhythm.     Heart sounds: Normal heart sounds.  Pulmonary:     Effort: Pulmonary effort is normal.     Breath sounds: Normal breath sounds.  Musculoskeletal:     Right lower leg: No edema.     Left lower leg: No edema.  Neurological:  Mental Status: She is alert.       EKG:   Recent Labs: 02/05/2023: TSH 12.400 03/01/2023: ALT 33; BUN 12; Creatinine, Ser 0.79; Hemoglobin 11.7; Platelets 365; Potassium 3.3; Sodium 137    Lipid Panel    Component Value Date/Time   CHOL 185 02/05/2023 1127   TRIG 235 (H) 02/05/2023  1127   HDL 48 02/05/2023 1127   CHOLHDL 3.9 02/05/2023 1127   LDLCALC 97 02/05/2023 1127      Other studies Reviewed: Additional studies/ records that were reviewed today include:  Review of the above records demonstrates:       No data to display            ASSESSMENT AND PLAN:  No diagnosis found.   Problem List Items Addressed This Visit   None      Disposition:   No follow-ups on file.    Total time spent: 30 minutes  Signed,  Adrian Blackwater, MD  04/11/2023 10:09 AM    [facility]

## 2023-04-15 DIAGNOSIS — G4733 Obstructive sleep apnea (adult) (pediatric): Secondary | ICD-10-CM | POA: Diagnosis not present

## 2023-04-23 ENCOUNTER — Ambulatory Visit: Payer: 59 | Admitting: Family

## 2023-04-23 ENCOUNTER — Encounter: Payer: Self-pay | Admitting: Family

## 2023-04-23 VITALS — BP 134/78 | HR 98 | Ht 64.0 in | Wt 247.2 lb

## 2023-04-23 DIAGNOSIS — J302 Other seasonal allergic rhinitis: Secondary | ICD-10-CM

## 2023-04-23 DIAGNOSIS — E038 Other specified hypothyroidism: Secondary | ICD-10-CM

## 2023-04-23 DIAGNOSIS — Z23 Encounter for immunization: Secondary | ICD-10-CM

## 2023-04-23 DIAGNOSIS — G4452 New daily persistent headache (NDPH): Secondary | ICD-10-CM | POA: Diagnosis not present

## 2023-04-23 MED ORDER — KETOTIFEN FUMARATE 0.035 % OP SOLN: 1.0000 [drp] | Freq: Two times a day (BID) | OPHTHALMIC | 1 refills | Status: DC

## 2023-04-24 LAB — TSH+T4F+T3FREE
Free T4: 0.78 ng/dL — ABNORMAL LOW (ref 0.82–1.77)
T3, Free: 2.7 pg/mL (ref 2.0–4.4)
TSH: 7.36 u[IU]/mL — ABNORMAL HIGH (ref 0.450–4.500)

## 2023-04-25 DIAGNOSIS — Z79899 Other long term (current) drug therapy: Secondary | ICD-10-CM | POA: Diagnosis not present

## 2023-04-25 DIAGNOSIS — G8929 Other chronic pain: Secondary | ICD-10-CM | POA: Diagnosis not present

## 2023-04-29 ENCOUNTER — Ambulatory Visit (INDEPENDENT_AMBULATORY_CARE_PROVIDER_SITE_OTHER): Payer: 59 | Admitting: Physician Assistant

## 2023-04-29 ENCOUNTER — Encounter: Payer: Self-pay | Admitting: Physician Assistant

## 2023-04-29 VITALS — BP 106/73 | HR 72 | Ht 64.0 in | Wt 240.0 lb

## 2023-04-29 DIAGNOSIS — N3941 Urge incontinence: Secondary | ICD-10-CM

## 2023-04-29 NOTE — Progress Notes (Signed)
Patient not seen, no charge visit

## 2023-04-30 ENCOUNTER — Other Ambulatory Visit: Payer: Self-pay

## 2023-04-30 ENCOUNTER — Ambulatory Visit (INDEPENDENT_AMBULATORY_CARE_PROVIDER_SITE_OTHER): Payer: 59 | Admitting: Family Medicine

## 2023-04-30 MED ORDER — LEVOTHYROXINE SODIUM 25 MCG PO TABS: 25.0000 ug | ORAL_TABLET | Freq: Every day | ORAL | 0 refills | Status: DC

## 2023-05-01 ENCOUNTER — Telehealth: Payer: Self-pay

## 2023-05-01 DIAGNOSIS — G4733 Obstructive sleep apnea (adult) (pediatric): Secondary | ICD-10-CM | POA: Diagnosis not present

## 2023-05-01 NOTE — Patient Outreach (Signed)
[facility] Assistant attempted to call patient on today regarding preventative mammogram screening. No answer from patient after multiple rings. Assistant left confidential voicemail for patient to return call.  Will call back patient back for final attempt.  Baruch Gouty [facility] Assistant [facility] [phone]

## 2023-05-02 ENCOUNTER — Telehealth (INDEPENDENT_AMBULATORY_CARE_PROVIDER_SITE_OTHER): Payer: 59 | Admitting: Family Medicine

## 2023-05-02 ENCOUNTER — Other Ambulatory Visit (INDEPENDENT_AMBULATORY_CARE_PROVIDER_SITE_OTHER): Payer: Self-pay | Admitting: Family Medicine

## 2023-05-02 ENCOUNTER — Ambulatory Visit: Payer: 59 | Admitting: Physician Assistant

## 2023-05-02 ENCOUNTER — Encounter (INDEPENDENT_AMBULATORY_CARE_PROVIDER_SITE_OTHER): Payer: Self-pay | Admitting: Family Medicine

## 2023-05-02 DIAGNOSIS — G43101 Migraine with aura, not intractable, with status migrainosus: Secondary | ICD-10-CM

## 2023-05-02 DIAGNOSIS — E66812 Obesity, class 2: Secondary | ICD-10-CM

## 2023-05-02 DIAGNOSIS — Z6837 Body mass index (BMI) 37.0-37.9, adult: Secondary | ICD-10-CM

## 2023-05-02 DIAGNOSIS — R7303 Prediabetes: Secondary | ICD-10-CM

## 2023-05-02 DIAGNOSIS — E669 Obesity, unspecified: Secondary | ICD-10-CM | POA: Diagnosis not present

## 2023-05-02 DIAGNOSIS — E559 Vitamin D deficiency, unspecified: Secondary | ICD-10-CM

## 2023-05-02 MED ORDER — METFORMIN HCL 500 MG PO TABS
500.0000 mg | ORAL_TABLET | Freq: Two times a day (BID) | ORAL | 0 refills | Status: DC
Start: 2023-05-02 — End: 2023-10-23

## 2023-05-02 MED ORDER — RYBELSUS 3 MG PO TABS
3.0000 mg | ORAL_TABLET | Freq: Every day | ORAL | 0 refills | Status: DC
Start: 2023-05-02 — End: 2023-08-27

## 2023-05-02 MED ORDER — VITAMIN D (ERGOCALCIFEROL) 1.25 MG (50000 UNIT) PO CAPS
ORAL_CAPSULE | ORAL | 0 refills | Status: DC
Start: 2023-05-02 — End: 2023-10-23

## 2023-05-02 NOTE — Progress Notes (Signed)
TeleHealth Visit:  Due to the COVID-19 pandemic, this visit was completed with telemedicine (audio/video) technology to reduce patient and provider exposure as well as to preserve personal protective equipment.   Alexis Christensen has verbally consented to this TeleHealth visit. The patient is located at home, the provider is located at the [facility] office. The participants in this visit include the listed provider and patient. The visit was conducted today via MyChart video.   Chief Complaint: OBESITY Alexis Christensen is here to discuss her progress with her obesity treatment plan along with follow-up of her obesity related diagnoses. Alexis Christensen is on the Category 1 Plan and states she is following her eating plan approximately 75% of the time. Alexis Christensen states she is walking for 30 minutes 4 times per week.  Today's visit was #: 27 Starting weight: 229 lbs Starting date: 03/28/2021  Interim History: Patient is struggling with weight loss. She changed to virtual visit. She feels she has gained weight, but she has not been able to meal plan and prep as much recently.   Subjective:   1. Pre-diabetes Patient is working on her diet but she has struggled more recently for variety of reasons.  No side effects were noted with her current medications.  She has some transportation issues which may make meal planning difficult.  2. Migraine with aura and with status migrainosus, not intractable Patient has worsening migraine headaches with nausea, and she is uncertain if this is in aura.  She does not have a headache specialist that she sees.  3. Vitamin D deficiency Patient is stable on prescription vitamin D with no side effects noted.  Assessment/Plan:   1. Pre-diabetes We will refill Rybelsus 3 mg once daily and metformin 500 mg twice daily for 1 month.  Patient will get back to her diet and exercise when possible.  - Semaglutide (RYBELSUS) 3 MG TABS; Take 1 tablet (3 mg total) by mouth daily.   Dispense: 30 tablet; Refill: 0 - metFORMIN (GLUCOPHAGE) 500 MG tablet; Take 1 tablet (500 mg total) by mouth 2 (two) times daily with a meal.  Dispense: 60 tablet; Refill: 0  2. Migraine with aura and with status migrainosus, not intractable Patient was referred to Dr. Lucia Gaskins of Neurology.  3. Vitamin D deficiency Patient will continue twice weekly prescription vitamin D, and we will refill for 1 month.  - Vitamin D, Ergocalciferol, (DRISDOL) 1.25 MG (50000 UNIT) CAPS capsule; 1 po q wed and 1 po q sun  Dispense: 8 capsule; Refill: 0  4. BMI 37.0-37.9, adult  5. Obesity,current BMI 41.69 Alexis Christensen is currently in the action stage of change. As such, her goal is to continue with weight loss efforts. She has agreed to the Category 1 Plan.   Patient is to work on portion control and Dealer choices when she is unable to follow her plan to minimize weight gain. We will follow back when she is ready.   Exercise goals: As is.   Behavioral modification strategies: meal planning and cooking strategies.  Alexis Christensen has agreed to follow-up with our clinic in 4 weeks. She was informed of the importance of frequent follow-up visits to maximize her success with intensive lifestyle modifications for her multiple health conditions.  Objective:   VITALS: Per patient if applicable, see vitals. GENERAL: Alert and in no acute distress. CARDIOPULMONARY: No increased WOB. Speaking in clear sentences.  PSYCH: Pleasant and cooperative. Speech normal rate and rhythm. Affect is appropriate. Insight and judgement are appropriate.  Attention is focused, linear, and appropriate.  NEURO: Oriented as arrived to appointment on time with no prompting.   Lab Results  Component Value Date   CREATININE 0.79 03/01/2023   BUN 12 03/01/2023   NA 137 03/01/2023   K 3.3 (L) 03/01/2023   CL 100 03/01/2023   CO2 30 03/01/2023   Lab Results  Component Value Date   ALT 33 03/01/2023   AST 25 03/01/2023   ALKPHOS 155  (H) 03/01/2023   BILITOT 0.4 03/01/2023   Lab Results  Component Value Date   HGBA1C 6.2 (H) 02/05/2023   HGBA1C 6.1 (H) 01/03/2023   HGBA1C 6.0 (H) 10/08/2022   HGBA1C 6.0 06/13/2022   HGBA1C 5.9 02/01/2022   Lab Results  Component Value Date   INSULIN 13.8 02/14/2022   INSULIN 6.4 08/01/2021   INSULIN 7.4 03/28/2021   Lab Results  Component Value Date   TSH 7.360 (H) 04/23/2023   Lab Results  Component Value Date   CHOL 185 02/05/2023   HDL 48 02/05/2023   LDLCALC 97 02/05/2023   TRIG 235 (H) 02/05/2023   CHOLHDL 3.9 02/05/2023   Lab Results  Component Value Date   VD25OH 19.1 (L) 01/03/2023   VD25OH 16.2 (L) 07/03/2022   VD25OH 19.6 (L) 02/14/2022   Lab Results  Component Value Date   WBC 7.1 03/01/2023   HGB 11.7 (L) 03/01/2023   HCT 37.4 03/01/2023   MCV 90.8 03/01/2023   PLT 365 03/01/2023   Lab Results  Component Value Date   IRON 39 07/13/2016   TIBC 408 07/13/2016   FERRITIN 11 07/13/2016    Attestation Statements:   Reviewed by clinician on day of visit: allergies, medications, problem list, medical history, surgical history, family history, social history, and previous encounter notes.   I, Burt Knack, am acting as transcriptionist for Quillian Quince, MD.  I have reviewed the above documentation for accuracy and completeness, and I agree with the above. - Quillian Quince, MD

## 2023-05-06 DIAGNOSIS — J4530 Mild persistent asthma, uncomplicated: Secondary | ICD-10-CM | POA: Diagnosis not present

## 2023-05-06 DIAGNOSIS — R0609 Other forms of dyspnea: Secondary | ICD-10-CM | POA: Diagnosis not present

## 2023-05-06 DIAGNOSIS — G4733 Obstructive sleep apnea (adult) (pediatric): Secondary | ICD-10-CM | POA: Diagnosis not present

## 2023-05-09 ENCOUNTER — Encounter: Payer: Self-pay | Admitting: Cardiology

## 2023-05-09 ENCOUNTER — Ambulatory Visit: Payer: 59 | Admitting: Cardiology

## 2023-05-09 VITALS — BP 118/72 | HR 80 | Ht 64.0 in | Wt 248.8 lb

## 2023-05-09 DIAGNOSIS — I5030 Unspecified diastolic (congestive) heart failure: Secondary | ICD-10-CM

## 2023-05-09 DIAGNOSIS — R601 Generalized edema: Secondary | ICD-10-CM

## 2023-05-09 MED ORDER — TORSEMIDE 20 MG PO TABS: 20.0000 mg | ORAL_TABLET | Freq: Every day | ORAL | 4 refills | Status: DC

## 2023-05-09 NOTE — Assessment & Plan Note (Signed)
Patient continues to have bilateral lower extremity edema. Patient has a history of chronic venous insufficiency. Will change furosemide to torsemide.

## 2023-05-09 NOTE — Progress Notes (Signed)
Cardiology Office Note   Date:  05/09/2023   ID:  Alexis Christensen, DOB January 30, 1971, MRN [id number]  PCP:  Miki Kins, FNP  Cardiologist:  Marisue Ivan, NP      History of Present Illness: Alexis Christensen is a 52 y.o. female who presents for No chief complaint on file.   Patient in office for one month follow up. Patient was started on Farxiga at her last visit. Patient states she has not noticed a difference in her bilateral lower extremity edema.  Patient continues to have blurry vision, develops headaches. Recommend following up with her eye doctor.       Past Medical History:  Diagnosis Date   Acute deep vein thrombosis (DVT) of right lower extremity (HCC) 2016   ADHD    Adopted    ANA positive    Anemia    Anxiety    Arthritis    Asthma    B12 deficiency    Bilateral hand pain    Bilateral swelling of feet and ankles    Bipolar 1 disorder (HCC)    Carpal tunnel syndrome of left wrist    Chest pain    Chronic diarrhea 09/07/2015   Chronic diastolic (congestive) heart failure (HCC)    Chronic left shoulder pain    Chronic venous insufficiency 04/26/2016   Clostridioides difficile infection 04/06/2019   Depression    Diverticulosis    Edema leg    GERD (gastroesophageal reflux disease)    Heart murmur    Helicobacter pylori (H. pylori) infection 08/24/2015   Hiatal hernia    History of 2019 novel coronavirus disease (COVID-19) 09/09/2020   a.) PCR testing (+): 09/09/2020, 01/12/2021, 07/17/2021   HLD (hyperlipidemia)    Hx MRSA infection    Hypothyroidism    Influenza A 10/01/2022   Knee effusion    Knee pain    Lipoma of arm 05/11/2013   Lower extremity edema    Lumbago    Lymphedema 04/26/2016   Migraine with aura    Multinodular goiter    Nephrolithiasis    Plantar fascial fibromatosis    Post traumatic stress disorder (PTSD)    Pre-diabetes    PVD (peripheral vascular disease) (HCC)    Seasonal allergies    Shortness of breath on  exertion    Stress incontinence    Urge incontinence    Venous insufficiency    Venous ulcer (HCC)    Vitamin D deficiency      Past Surgical History:  Procedure Laterality Date   ABDOMINAL HYSTERECTOMY     ANKLE SURGERY     CHOLECYSTECTOMY     COLONOSCOPY WITH PROPOFOL N/A 06/24/2019   Procedure: COLONOSCOPY WITH PROPOFOL;  Surgeon: Toledo, Boykin Nearing, MD;  Location: [facility];  Service: Gastroenterology;  Laterality: N/A;   CYSTOSCOPY N/A 04/09/2016   Procedure: CYSTOSCOPY;  Surgeon: Alfredo Martinez, MD;  Location: [facility];  Service: Urology;  Laterality: N/A;   DILATION AND CURETTAGE OF UTERUS     ESOPHAGOGASTRODUODENOSCOPY (EGD) WITH PROPOFOL N/A 06/24/2019   Procedure: ESOPHAGOGASTRODUODENOSCOPY (EGD) WITH PROPOFOL;  Surgeon: Toledo, Boykin Nearing, MD;  Location: [facility];  Service: Gastroenterology;  Laterality: N/A;   INGUINAL HERNIA REPAIR Bilateral 04/18/2017   Procedure: LAPAROSCOPIC BILATERAL INGUINAL HERNIA REPAIR;  Surgeon: Leafy Ro, MD;  Location: [facility];  Service: General;  Laterality: Bilateral;   INTERSTIM IMPLANT PLACEMENT N/A 11/26/2016   Procedure: Leane Platt IMPLANT FIRST STAGE;  Surgeon: Alfredo Martinez, MD;  Location: [facility];  Service: Urology;  Laterality: N/A;   INTERSTIM IMPLANT PLACEMENT N/A 11/26/2016   Procedure: Leane Platt IMPLANT SECOND STAGE;  Surgeon: Alfredo Martinez, MD;  Location: [facility];  Service: Urology;  Laterality: N/A;   INTERSTIM IMPLANT PLACEMENT N/A 02/18/2023   Procedure: Leane Platt IMPLANT FIRST STAGE WITH IMPEDANCE CHECK;  Surgeon: Alfredo Martinez, MD;  Location: [facility];  Service: Urology;  Laterality: N/A;   INTERSTIM IMPLANT PLACEMENT N/A 02/18/2023   Procedure: Leane Platt IMPLANT SECOND STAGE;  Surgeon: Alfredo Martinez, MD;  Location: [facility];  Service: Urology;  Laterality: N/A;   INTERSTIM IMPLANT REMOVAL N/A 02/18/2023   Procedure: REMOVAL OF INTERSTIM IMPLANT;  Surgeon: Alfredo Martinez, MD;  Location: [facility];   Service: Urology;  Laterality: N/A;   JOINT REPLACEMENT Right    knee   KNEE ARTHROSCOPY Bilateral    KNEE ARTHROSCOPY WITH LATERAL RELEASE Left 10/18/2015   Procedure: KNEE ARTHROSCOPY LATERAL AND PARTIAL SYNOVECTOMY;  Surgeon: Kennedy Bucker, MD;  Location: [facility];  Service: Orthopedics;  Laterality: Left;   Lymph Node removal  2015   Neck   PUBOVAGINAL SLING N/A 04/09/2016   Procedure: PUBO-VAGINAL SLING/ RETROPUBIC SLING;  Surgeon: Alfredo Martinez, MD;  Location: [facility];  Service: Urology;  Laterality: N/A;   REPLACEMENT TOTAL KNEE Right    SHOULDER SURGERY Right 2014   TUBAL LIGATION     WRIST SURGERY Right    metal plate     Current Outpatient Medications  Medication Sig Dispense Refill   torsemide (DEMADEX) 20 MG tablet Take 1 tablet (20 mg total) by mouth daily. 30 tablet 4   albuterol (VENTOLIN HFA) 108 (90 Base) MCG/ACT inhaler Inhale 1 puff into the lungs every 6 (six) hours as needed for wheezing or shortness of breath.     ARIPiprazole (ABILIFY) 30 MG tablet Take 30 mg by mouth daily.     aspirin 81 MG chewable tablet Aspirin 81 MG Oral Tablet Chewable QTY: 30 tablet Days: 30 Refills: 0  Written: 01/23/21 Patient Instructions: once a day     Budeson-Glycopyrrol-Formoterol (BREZTRI AEROSPHERE) 160-9-4.8 MCG/ACT AERO Inhale 2 puffs into the lungs in the morning and at bedtime.     celecoxib (CELEBREX) 100 MG capsule Take 100 mg by mouth 2 (two) times daily.     colestipol (COLESTID) 1 g tablet Take 2 tablets (2 g total) by mouth 2 (two) times daily. 120 tablet 5   cyanocobalamin (VITAMIN B12) 500 MCG tablet Take 1 tablet (500 mcg total) by mouth daily.     dicyclomine (BENTYL) 10 MG capsule TAKE 1 CAPSULE BY MOUTH EVERY 8 HOURS AS NEEDED FOR ABDOMINAL PAIN 270 capsule 3   DULoxetine (CYMBALTA) 60 MG capsule Take 60 mg by mouth 2 (two) times daily.     empagliflozin (JARDIANCE) 25 MG TABS tablet Take 1 tablet (25 mg total) by mouth daily. 30 tablet 3   esomeprazole (NEXIUM)  40 MG capsule Take 1 capsule (40 mg total) by mouth 2 (two) times daily before a meal. 180 capsule 1   Evolocumab (REPATHA SURECLICK) 140 MG/ML SOAJ Inject into the skin.     famotidine (PEPCID) 20 MG tablet Take 1 tablet (20 mg total) by mouth 2 (two) times daily. 180 tablet 1   gabapentin (NEURONTIN) 600 MG tablet Take 600 mg by mouth 2 (two) times daily.      hydrOXYzine (ATARAX/VISTARIL) 25 MG tablet Take 50 mg by mouth every 8 (eight) hours as needed for itching.     ketotifen (ZADITOR) 0.035 % ophthalmic solution Place  1 drop into both eyes 2 (two) times daily. 10 mL 1   KLOR-CON M10 10 MEQ tablet Take 10 mEq by mouth daily.     levothyroxine (SYNTHROID) 200 MCG tablet PLEASE SEE ATTACHED FOR DETAILED DIRECTIONS     levothyroxine (SYNTHROID) 25 MCG tablet Take 1 tablet (25 mcg total) by mouth daily before breakfast. Along with 200mg  dose to equal daily in the AM 90 tablet 0   liothyronine (CYTOMEL) 5 MCG tablet Take 10 mcg by mouth daily.     lisinopril (ZESTRIL) 10 MG tablet Take 1 tablet (10 mg total) by mouth daily. 30 tablet 11   meloxicam (MOBIC) 15 MG tablet Take by mouth.     metFORMIN (GLUCOPHAGE) 500 MG tablet Take 1 tablet (500 mg total) by mouth 2 (two) times daily with a meal. 60 tablet 0   methocarbamol (ROBAXIN) 500 MG tablet Take 1 tablet (500 mg total) by mouth 3 (three) times daily. 30 tablet 0   metoprolol tartrate (LOPRESSOR) 50 MG tablet Take 50 mg by mouth daily in the afternoon.     montelukast (SINGULAIR) 10 MG tablet Take 10 mg by mouth daily.     nitrofurantoin, macrocrystal-monohydrate, (MACROBID) 100 MG capsule Take 1 capsule (100 mg total) by mouth daily. 90 capsule 3   nortriptyline (PAMELOR) 10 MG capsule Take 10 mg in the morning     nystatin powder Apply 1 Application topically 3 (three) times daily. 15 g 3   potassium chloride SA (KLOR-CON M) 20 MEQ tablet Take 1 tablet (20 mEq total) by mouth 2 (two) times daily. 6 tablet 0   rosuvastatin (CRESTOR)  40 MG tablet Take 40 mg by mouth daily.     Semaglutide (RYBELSUS) 3 MG TABS Take 1 tablet (3 mg total) by mouth daily. 30 tablet 0   spironolactone (ALDACTONE) 25 MG tablet TAKE 1/2 (HALF) TABLET BY MOUTH ONCE DAILY. 45 tablet 1   SUBOXONE 8-2 MG FILM Dissolve 1 film under tongue twice daily  0   Vitamin D, Ergocalciferol, (DRISDOL) 1.25 MG (50000 UNIT) CAPS capsule 1 po q wed and 1 po q sun 8 capsule 0   No current facility-administered medications for this visit.    Allergies:   Geodon [ziprasidone hydrochloride], Sulfacetamide sodium, Ziprasidone, Ziprasidone hcl, Ace inhibitors, Erythromycin, Hydromorphone, Lamictal [lamotrigine], Other, Strawberry (diagnostic), and Sulfa antibiotics    Social History:   reports that she quit smoking about 3 years ago. Her smoking use included cigarettes. She started smoking about 14 years ago. She has a 5.5 pack-year smoking history. She has been exposed to tobacco smoke. She has quit using smokeless tobacco. She reports that she does not currently use alcohol. She reports that she does not currently use drugs.   Family History:  She was adopted. Family history is unknown by patient.    ROS:     Review of Systems  Constitutional: Negative.   HENT: Negative.    Eyes:  Positive for blurred vision.  Respiratory: Negative.    Cardiovascular: Negative.   Gastrointestinal: Negative.   Genitourinary: Negative.   Musculoskeletal: Negative.   Skin: Negative.   Neurological:  Positive for headaches.  Endo/Heme/Allergies: Negative.   Psychiatric/Behavioral: Negative.    All other systems reviewed and are negative.     All other systems are reviewed and negative.    PHYSICAL EXAM: VS:  BP 118/72   Pulse 80   Ht 5\' 4"  (1.626 m)   Wt 248 lb 12.8 oz (112.9 kg)  SpO2 98%   BMI 42.71 kg/m  , BMI Body mass index is 42.71 kg/m. Last weight:  Wt Readings from Last 3 Encounters:  05/09/23 248 lb 12.8 oz (112.9 kg)  04/29/23 240 lb (108.9 kg)   04/23/23 247 lb 3.2 oz (112.1 kg)     Physical Exam Vitals and nursing note reviewed.  Constitutional:      Appearance: Normal appearance. She is normal weight.  HENT:     Head: Normocephalic and atraumatic.     Nose: Nose normal.     Mouth/Throat:     Mouth: Mucous membranes are moist.     Pharynx: Oropharynx is clear.  Eyes:     Conjunctiva/sclera: Conjunctivae normal.     Pupils: Pupils are equal, round, and reactive to light.  Cardiovascular:     Rate and Rhythm: Normal rate and regular rhythm.     Pulses: Normal pulses.     Heart sounds: Normal heart sounds.  Pulmonary:     Effort: Pulmonary effort is normal.     Breath sounds: Normal breath sounds.  Abdominal:     General: Abdomen is flat. Bowel sounds are normal.     Palpations: Abdomen is soft.  Musculoskeletal:        General: Normal range of motion.     Cervical back: Normal range of motion.  Skin:    General: Skin is warm and dry.  Neurological:     General: No focal deficit present.     Mental Status: She is alert and oriented to person, place, and time. Mental status is at baseline.  Psychiatric:        Mood and Affect: Mood normal.        Behavior: Behavior normal.       EKG:   Recent Labs: 03/01/2023: ALT 33; BUN 12; Creatinine, Ser 0.79; Hemoglobin 11.7; Platelets 365; Potassium 3.3; Sodium 137 04/23/2023: TSH 7.360    Lipid Panel    Component Value Date/Time   CHOL 185 02/05/2023 1127   TRIG 235 (H) 02/05/2023 1127   HDL 48 02/05/2023 1127   CHOLHDL 3.9 02/05/2023 1127   LDLCALC 97 02/05/2023 1127      Other studies Reviewed: Additional studies/ records that were reviewed today include:  Review of the above records demonstrates:       No data to display          ASSESSMENT AND PLAN:    ICD-10-CM   1. Generalized edema  R60.1     2. Diastolic congestive heart failure, unspecified HF chronicity (HCC)  I50.30        Problem List Items Addressed This Visit        Cardiovascular and Mediastinum   Diastolic congestive heart failure (HCC)   Relevant Medications   torsemide (DEMADEX) 20 MG tablet     Other   Generalized edema - Primary    Patient continues to have bilateral lower extremity edema. Patient has a history of chronic venous insufficiency. Will change furosemide to torsemide.           Disposition:   Return in about 4 weeks (around 06/06/2023) for cardiology.    Total time spent: 25 minutes  Signed,  Marisue Ivan, NP  05/09/2023 9:52 AM    [facility]

## 2023-05-14 ENCOUNTER — Encounter: Payer: Self-pay | Admitting: Physician Assistant

## 2023-05-14 ENCOUNTER — Ambulatory Visit: Payer: 59 | Admitting: Physician Assistant

## 2023-05-14 VITALS — BP 130/80 | HR 74 | Ht 64.0 in | Wt 240.0 lb

## 2023-05-14 DIAGNOSIS — N3941 Urge incontinence: Secondary | ICD-10-CM

## 2023-05-14 NOTE — Progress Notes (Signed)
Patient presents to clinic today for InterStim management.  She reports that she has not made any adjustments or tried to use her remote control since having her device replaced.  We charged her remote and communicator device in clinic today.  I turned these on and set her InterStim to P1, 1.3.  She felt a light flutter with this.  We discussed proceeding through programs 1 through 7 and spending 2 weeks on each program to test its efficacy.  We discussed that after the first week, she may increase the intensity if needed.  Once she finds a program that controls her symptoms, she can leave that program running.  If she tests all 7 programs and is unsuccessful in finding a program that works for her, she should follow-up with me in clinic. She expressed understanding.  Carman Ching, PA-C 05/14/23 3:53 PM  I spent 18 minutes on the day of the encounter to include pre-visit record review, face-to-face time with the patient, and post-visit ordering of tests.

## 2023-05-22 DIAGNOSIS — E063 Autoimmune thyroiditis: Secondary | ICD-10-CM | POA: Diagnosis not present

## 2023-05-23 DIAGNOSIS — Z79899 Other long term (current) drug therapy: Secondary | ICD-10-CM | POA: Diagnosis not present

## 2023-05-23 DIAGNOSIS — G8929 Other chronic pain: Secondary | ICD-10-CM | POA: Diagnosis not present

## 2023-05-29 DIAGNOSIS — E042 Nontoxic multinodular goiter: Secondary | ICD-10-CM | POA: Diagnosis not present

## 2023-05-29 DIAGNOSIS — E063 Autoimmune thyroiditis: Secondary | ICD-10-CM | POA: Diagnosis not present

## 2023-06-02 ENCOUNTER — Encounter: Payer: Self-pay | Admitting: Family

## 2023-06-02 NOTE — Progress Notes (Signed)
Acute Office Visit  Subjective:     Patient ID: Alexis Christensen, female    DOB: [birth year], 52 y.o.   MRN: [id number]  Patient is in today for  Chief Complaint  Patient presents with   Headache    Been going on for 2 weeks Pressure is behind eyes, dry and itchy eyes    Patient is here today with new complaint daily headache. This has been occurring for approximately 2 weeks.  She says that the pain is located frontally and behind her eyes. She is also having some nasal drainage and itchy eyes.  Headache  This is a new problem. The current episode started 1 to 4 weeks ago. The problem occurs daily. The problem has been waxing and waning.     Review of Systems  HENT:  Positive for congestion and sinus pain.   Eyes:        Itchy eyes  Neurological:  Positive for headaches.  All other systems reviewed and are negative.       Objective:    BP 134/78   Pulse 98   Ht 5\' 4"  (1.626 m)   Wt 247 lb 3.2 oz (112.1 kg)   SpO2 98%   BMI 42.43 kg/m   Physical Exam Vitals and nursing note reviewed.  Constitutional:      Appearance: Normal appearance. She is normal weight.  HENT:     Head: Normocephalic.     Nose: Congestion present.     Right Turbinates: Swollen.     Left Turbinates: Swollen.     Right Sinus: Frontal sinus tenderness present.     Left Sinus: Frontal sinus tenderness present.  Eyes:     Extraocular Movements: Extraocular movements intact.     Conjunctiva/sclera: Conjunctivae normal.     Pupils: Pupils are equal, round, and reactive to light.  Cardiovascular:     Rate and Rhythm: Normal rate.  Pulmonary:     Effort: Pulmonary effort is normal.  Neurological:     General: No focal deficit present.     Mental Status: She is alert and oriented to person, place, and time. Mental status is at baseline.  Psychiatric:        Mood and Affect: Mood normal.        Behavior: Behavior normal.        Thought Content: Thought content normal.     Results for  orders placed or performed in visit on 04/23/23  TSH+T4F+T3Free  Result Value Ref Range   TSH 7.360 (H) 0.450 - 4.500 uIU/mL   T3, Free 2.7 2.0 - 4.4 pg/mL   Free T4 0.78 (L) 0.82 - 1.77 ng/dL    Recent Results (from the past 2160 hour(s))  Urinalysis, Complete     Status: Abnormal   Collection Time: 03/04/23  3:33 PM  Result Value Ref Range   Specific Gravity, UA >1.030 (H) 1.005 - 1.030   pH, UA 5.5 5.0 - 7.5   Color, UA Yellow Yellow   Appearance Ur Hazy (A) Clear   Leukocytes,UA 1+ (A) Negative   Protein,UA Negative Negative/Trace   Glucose, UA Negative Negative   Ketones, UA Trace (A) Negative   RBC, UA Trace (A) Negative   Bilirubin, UA Negative Negative   Urobilinogen, Ur 0.2 0.2 - 1.0 mg/dL   Nitrite, UA Negative Negative   Microscopic Examination See below:   Microscopic Examination     Status: Abnormal   Collection Time: 03/04/23  3:33 PM   Urine  Result Value Ref Range   WBC, UA 11-30 (A) 0 - 5 /hpf   RBC, Urine 3-10 (A) 0 - 2 /hpf   Epithelial Cells (non renal) >10 (A) 0 - 10 /hpf   Mucus, UA Present (A) Not Estab.   Bacteria, UA Many (A) None seen/Few  TSH+T4F+T3Free     Status: Abnormal   Collection Time: 04/23/23  4:16 PM  Result Value Ref Range   TSH 7.360 (H) 0.450 - 4.500 uIU/mL   T3, Free 2.7 2.0 - 4.4 pg/mL   Free T4 0.78 (L) 0.82 - 1.77 ng/dL    Allergies as of 78/08/9560       Reactions   Geodon [ziprasidone Hydrochloride] Other (See Comments)   Numbness ,sob, headaches, blurred vision   Sulfacetamide Sodium Hives   Ziprasidone Anaphylaxis   Other reaction(s): Other (See Comments) Numbness ,sob, headaches, blurred vision Other reaction(s): Other (See Comments), Unknown Numbness ,sob, headaches, blurred vision Numbness ,sob, headaches, blurred vision Other reaction(s): Other (See Comments), Unknown Numbness ,sob, headaches, blurred vision Numbness ,sob, headaches, blurred vision Numbness ,sob, headaches, blurred vision Other reaction(s):  Other (See Comments), Unknown Numbness ,sob, headaches, blurred vision Numbness ,sob, headaches, blurred vision Other reaction(s): Other (See Comments), Unknown Numbness ,sob, headaches, blurred vision Numbness ,sob, headaches, blurred vision   Ziprasidone Hcl Anaphylaxis, Other (See Comments)   Other reaction(s): Other (See Comments), Unknown Numbness ,sob, headaches, blurred vision Numbness ,sob, headaches, blurred vision   Ace Inhibitors Rash   Erythromycin Rash, Hives   Hydromorphone Rash   Lamictal [lamotrigine] Rash   Other reaction(s): Unknown   Strawberry (diagnostic) Rash   Sulfa Antibiotics Hives, Rash   Other reaction(s): Unknown Other reaction(s): Unknown Other reaction(s): Unknown        Medication List        Accurate as of April 23, 2023 11:59 PM. If you have any questions, ask your nurse or doctor.          albuterol 108 (90 Base) MCG/ACT inhaler Commonly known as: VENTOLIN HFA Inhale 1 puff into the lungs every 6 (six) hours as needed for wheezing or shortness of breath.   ARIPiprazole 30 MG tablet Commonly known as: ABILIFY Take 30 mg by mouth daily.   aspirin 81 MG chewable tablet Aspirin 81 MG Oral Tablet Chewable QTY: 30 tablet Days: 30 Refills: 0  Written: 01/23/21 Patient Instructions: once a day   Breztri Aerosphere 160-9-4.8 MCG/ACT Aero Generic drug: Budeson-Glycopyrrol-Formoterol Inhale 2 puffs into the lungs in the morning and at bedtime.   celecoxib 100 MG capsule Commonly known as: CELEBREX Take 100 mg by mouth 2 (two) times daily.   colestipol 1 g tablet Commonly known as: COLESTID Take 2 tablets (2 g total) by mouth 2 (two) times daily.   cyanocobalamin 500 MCG tablet Commonly known as: VITAMIN B12 Take 1 tablet (500 mcg total) by mouth daily.   dicyclomine 10 MG capsule Commonly known as: BENTYL TAKE 1 CAPSULE BY MOUTH EVERY 8 HOURS AS NEEDED FOR ABDOMINAL PAIN   DULoxetine 60 MG capsule Commonly known as:  CYMBALTA Take 60 mg by mouth 2 (two) times daily.   empagliflozin 25 MG Tabs tablet Commonly known as: Jardiance Take 1 tablet (25 mg total) by mouth daily.   esomeprazole 40 MG capsule Commonly known as: NexIUM Take 1 capsule (40 mg total) by mouth 2 (two) times daily before a meal.   famotidine 20 MG tablet Commonly known as: PEPCID Take 1 tablet (20 mg total) by mouth 2 (two)  times daily.   furosemide 40 MG tablet Commonly known as: LASIX TAKE 1 TABLET BY MOUTH EVERY DAY   gabapentin 600 MG tablet Commonly known as: NEURONTIN Take 600 mg by mouth 2 (two) times daily.   hydrOXYzine 25 MG tablet Commonly known as: ATARAX Take 50 mg by mouth every 8 (eight) hours as needed for itching.   ketotifen 0.035 % ophthalmic solution Commonly known as: ZADITOR Place 1 drop into both eyes 2 (two) times daily. Started by: Miki Kins   Klor-Con M10 10 MEQ tablet Generic drug: potassium chloride Take 10 mEq by mouth daily.   potassium chloride SA 20 MEQ tablet Commonly known as: KLOR-CON M Take 1 tablet (20 mEq total) by mouth 2 (two) times daily.   levothyroxine 200 MCG tablet Commonly known as: SYNTHROID PLEASE SEE ATTACHED FOR DETAILED DIRECTIONS   liothyronine 5 MCG tablet Commonly known as: CYTOMEL Take 10 mcg by mouth daily.   lisinopril 10 MG tablet Commonly known as: ZESTRIL Take 1 tablet (10 mg total) by mouth daily.   meloxicam 15 MG tablet Commonly known as: MOBIC Take by mouth.   metFORMIN 500 MG tablet Commonly known as: GLUCOPHAGE Take 1 tablet (500 mg total) by mouth 2 (two) times daily with a meal.   methocarbamol 500 MG tablet Commonly known as: ROBAXIN Take 1 tablet (500 mg total) by mouth 3 (three) times daily.   metoprolol tartrate 50 MG tablet Commonly known as: LOPRESSOR Take 50 mg by mouth daily in the afternoon.   montelukast 10 MG tablet Commonly known as: SINGULAIR Take 10 mg by mouth daily.   nitrofurantoin  (macrocrystal-monohydrate) 100 MG capsule Commonly known as: MACROBID Take 1 capsule (100 mg total) by mouth daily.   nortriptyline 10 MG capsule Commonly known as: PAMELOR Take 10 mg in the morning   nystatin powder Commonly known as: nystatin Apply 1 Application topically 3 (three) times daily.   Repatha SureClick 140 MG/ML Soaj Generic drug: Evolocumab Inject into the skin.   rosuvastatin 40 MG tablet Commonly known as: CRESTOR Take 40 mg by mouth daily.   Rybelsus 3 MG Tabs Generic drug: Semaglutide Take 1 tablet (3 mg total) by mouth daily.   spironolactone 25 MG tablet Commonly known as: ALDACTONE TAKE 1/2 (HALF) TABLET BY MOUTH ONCE DAILY.   Suboxone 8-2 MG Film Generic drug: Buprenorphine HCl-Naloxone HCl Dissolve 1 film under tongue twice daily   Vitamin D (Ergocalciferol) 1.25 MG (50000 UNIT) Caps capsule Commonly known as: DRISDOL 1 po q wed and 1 po q sun            Assessment & Plan:   Problem List Items Addressed This Visit       Active Problems   Hypothyroidism    Rechecking thyroid levels today. Last set was very improved but still high. Will contact patient with results and adjust meds as needed      Relevant Orders   TSH+T4F+T3Free (Completed)   Other Visit Diagnoses     New daily persistent headache    -  Primary   Given timing and associated symptoms, strongly suspect sinus infection.  Sent antibiotics for patient.  She will let me know if symptoms do not improve   Seasonal allergies       Relevant Medications   ketotifen (ZADITOR) 0.035 % ophthalmic solution   Needs flu shot       Flu vaccine given in office today.  Patient encouraged to manage symptoms as needed with supportive measures.  Relevant Orders   Influenza, MDCK, trivalent, PF(Flucelvax egg-free) (Completed)        Return as scheduled unless not improving.  Total time spent: 20 minutes  Miki Kins, FNP  04/23/2023   This document may have been  prepared by [facility] Voice Recognition software and as such may include unintentional dictation errors.

## 2023-06-02 NOTE — Assessment & Plan Note (Addendum)
Rechecking thyroid levels today. Last set was very improved but still high. Will contact patient with results and adjust meds as needed

## 2023-06-07 ENCOUNTER — Ambulatory Visit: Payer: 59 | Admitting: Cardiovascular Disease

## 2023-06-10 ENCOUNTER — Ambulatory Visit: Payer: 59 | Admitting: Family

## 2023-06-16 DIAGNOSIS — G4733 Obstructive sleep apnea (adult) (pediatric): Secondary | ICD-10-CM | POA: Diagnosis not present

## 2023-06-18 ENCOUNTER — Ambulatory Visit: Payer: 59 | Admitting: Family

## 2023-06-18 ENCOUNTER — Encounter: Payer: Self-pay | Admitting: Family

## 2023-06-18 VITALS — BP 118/72 | HR 70 | Ht 64.0 in | Wt 248.4 lb

## 2023-06-18 DIAGNOSIS — H579 Unspecified disorder of eye and adnexa: Secondary | ICD-10-CM

## 2023-06-18 DIAGNOSIS — R067 Sneezing: Secondary | ICD-10-CM | POA: Diagnosis not present

## 2023-06-18 DIAGNOSIS — J309 Allergic rhinitis, unspecified: Secondary | ICD-10-CM

## 2023-06-18 DIAGNOSIS — Z0130 Encounter for examination of blood pressure without abnormal findings: Secondary | ICD-10-CM

## 2023-06-18 MED ORDER — FEXOFENADINE HCL 180 MG PO TABS: 180.0000 mg | ORAL_TABLET | Freq: Every day | ORAL | 1 refills | Status: DC

## 2023-06-21 ENCOUNTER — Other Ambulatory Visit: Payer: 59 | Admitting: Family

## 2023-06-21 ENCOUNTER — Other Ambulatory Visit: Payer: 59

## 2023-06-21 DIAGNOSIS — R067 Sneezing: Secondary | ICD-10-CM | POA: Diagnosis not present

## 2023-06-21 DIAGNOSIS — H579 Unspecified disorder of eye and adnexa: Secondary | ICD-10-CM | POA: Diagnosis not present

## 2023-06-24 ENCOUNTER — Other Ambulatory Visit: Payer: Self-pay | Admitting: Physician Assistant

## 2023-06-24 DIAGNOSIS — D509 Iron deficiency anemia, unspecified: Secondary | ICD-10-CM

## 2023-06-24 LAB — ALLERGENS W/COMP RFLX AREA 2
Alternaria Alternata IgE: <0.1 kU/L
Aspergillus Fumigatus IgE: <0.1 kU/L
Bermuda Grass IgE: 0.27 kU/L — AB
Cedar, Mountain IgE: 0.17 kU/L — AB
Cladosporium Herbarum IgE: <0.1 kU/L
Cockroach, German IgE: 0.73 kU/L — AB
Common Silver Birch IgE: 0.11 kU/L — AB
Cottonwood IgE: 0.11 kU/L — AB
D Farinae IgE: 33.7 kU/L — AB
D Pteronyssinus IgE: 41.6 kU/L — AB
E001-IgE Cat Dander: <0.1 kU/L
E005-IgE Dog Dander: <0.1 kU/L
Elm, American IgE: 0.34 kU/L — AB
IgE (Immunoglobulin E), Serum: 305 [IU]/mL (ref 6–495)
Johnson Grass IgE: <0.1 kU/L
Maple/Box Elder IgE: <0.1 kU/L
Mouse Urine IgE: <0.1 kU/L
Oak, White IgE: 0.11 kU/L — AB
Pecan, Hickory IgE: 0.23 kU/L — AB
Penicillium Chrysogen IgE: <0.1 kU/L
Pigweed, Rough IgE: 0.16 kU/L — AB
Ragweed, Short IgE: 0.24 kU/L — AB
Sheep Sorrel IgE Qn: 0.13 kU/L — AB
Timothy Grass IgE: <0.1 kU/L
White Mulberry IgE: <0.1 kU/L

## 2023-06-24 LAB — ALLERGEN FOOD PROFILE SPECIFIC IGE
Allergen Apple, IgE: <0.1 kU/L
Allergen Corn, IgE: <0.1 kU/L
Allergen Tomato, IgE: <0.1 kU/L
Chicken IgE: <0.1 kU/L
Codfish IgE: <0.1 kU/L
Egg White IgE: 0.12 kU/L — AB
Milk IgE: 0.16 kU/L — AB
Orange: <0.1 kU/L
Peanut IgE: <0.1 kU/L
Shrimp IgE: 0.14 kU/L — AB
Soybean IgE: <0.1 kU/L
Tuna: <0.1 kU/L
Wheat IgE: 0.23 kU/L — AB

## 2023-06-27 DIAGNOSIS — G8929 Other chronic pain: Secondary | ICD-10-CM | POA: Diagnosis not present

## 2023-06-27 DIAGNOSIS — Z79899 Other long term (current) drug therapy: Secondary | ICD-10-CM | POA: Diagnosis not present

## 2023-07-13 DIAGNOSIS — E039 Hypothyroidism, unspecified: Secondary | ICD-10-CM | POA: Diagnosis not present

## 2023-07-13 DIAGNOSIS — I5032 Chronic diastolic (congestive) heart failure: Secondary | ICD-10-CM | POA: Diagnosis not present

## 2023-07-13 DIAGNOSIS — R0981 Nasal congestion: Secondary | ICD-10-CM | POA: Diagnosis not present

## 2023-07-13 DIAGNOSIS — Z91018 Allergy to other foods: Secondary | ICD-10-CM | POA: Diagnosis not present

## 2023-07-13 DIAGNOSIS — Z885 Allergy status to narcotic agent status: Secondary | ICD-10-CM | POA: Diagnosis not present

## 2023-07-13 DIAGNOSIS — Z9989 Dependence on other enabling machines and devices: Secondary | ICD-10-CM | POA: Diagnosis not present

## 2023-07-13 DIAGNOSIS — G4733 Obstructive sleep apnea (adult) (pediatric): Secondary | ICD-10-CM | POA: Diagnosis not present

## 2023-07-13 DIAGNOSIS — B372 Candidiasis of skin and nail: Secondary | ICD-10-CM | POA: Diagnosis not present

## 2023-07-13 DIAGNOSIS — J3489 Other specified disorders of nose and nasal sinuses: Secondary | ICD-10-CM | POA: Diagnosis not present

## 2023-07-13 DIAGNOSIS — E785 Hyperlipidemia, unspecified: Secondary | ICD-10-CM | POA: Diagnosis not present

## 2023-07-13 DIAGNOSIS — Z882 Allergy status to sulfonamides status: Secondary | ICD-10-CM | POA: Diagnosis not present

## 2023-07-13 DIAGNOSIS — J45909 Unspecified asthma, uncomplicated: Secondary | ICD-10-CM | POA: Diagnosis not present

## 2023-07-13 DIAGNOSIS — K219 Gastro-esophageal reflux disease without esophagitis: Secondary | ICD-10-CM | POA: Diagnosis not present

## 2023-07-13 DIAGNOSIS — B3789 Other sites of candidiasis: Secondary | ICD-10-CM | POA: Diagnosis not present

## 2023-07-13 DIAGNOSIS — M1990 Unspecified osteoarthritis, unspecified site: Secondary | ICD-10-CM | POA: Diagnosis not present

## 2023-07-13 DIAGNOSIS — R059 Cough, unspecified: Secondary | ICD-10-CM | POA: Diagnosis not present

## 2023-07-13 DIAGNOSIS — Z87891 Personal history of nicotine dependence: Secondary | ICD-10-CM | POA: Diagnosis not present

## 2023-07-13 DIAGNOSIS — Z881 Allergy status to other antibiotic agents status: Secondary | ICD-10-CM | POA: Diagnosis not present

## 2023-07-13 DIAGNOSIS — Z888 Allergy status to other drugs, medicaments and biological substances status: Secondary | ICD-10-CM | POA: Diagnosis not present

## 2023-07-16 ENCOUNTER — Ambulatory Visit (INDEPENDENT_AMBULATORY_CARE_PROVIDER_SITE_OTHER): Payer: 59 | Admitting: Cardiology

## 2023-07-16 ENCOUNTER — Encounter: Payer: Self-pay | Admitting: Cardiology

## 2023-07-16 VITALS — BP 132/84 | HR 70 | Ht 64.0 in | Wt 246.0 lb

## 2023-07-16 DIAGNOSIS — Z0130 Encounter for examination of blood pressure without abnormal findings: Secondary | ICD-10-CM

## 2023-07-16 DIAGNOSIS — J0100 Acute maxillary sinusitis, unspecified: Secondary | ICD-10-CM

## 2023-07-16 HISTORY — DX: Acute maxillary sinusitis, unspecified: J01.00

## 2023-07-16 MED ORDER — AMOXICILLIN-POT CLAVULANATE 875-125 MG PO TABS: 1.0000 | ORAL_TABLET | Freq: Two times a day (BID) | ORAL | 0 refills | Status: AC

## 2023-07-16 MED ORDER — FLUTICASONE PROPIONATE 50 MCG/ACT NA SUSP: 1.0000 | Freq: Every day | NASAL | 2 refills | Status: DC

## 2023-07-16 NOTE — Progress Notes (Signed)
 Established Patient Office Visit  Subjective:  Patient ID: AGAM TUOHY, female    DOB: 11-05-1970  Age: 52 y.o. MRN: [id number]  Chief Complaint  Patient presents with   Sinus Problem    Seen in er Saturday and they didn't give her anything Testes for covid flu rsv all came back negative Symptoms started Friday     Patient in office for an acute visit, thinks she may have a sinus infection. Patient states symptoms started last Friday, went to ED on Saturday. Tested negative for Flu/RSV/Covid. Patient continues to have sinus pain, sore throat, congestion, cough, and mucous production. Continue Mucinex, increase fluids, Augmentin  and Flonase  sent to the pharmacy.   Sinus Problem This is a new problem. The current episode started in the past 7 days. The problem is unchanged. There has been no fever. Associated symptoms include congestion, coughing, ear pain, sinus pressure, sneezing and a sore throat. Pertinent negatives include no headaches or shortness of breath. Treatments tried: Mucinex. The treatment provided no relief.    No other concerns at this time.   Past Medical History:  Diagnosis Date   Acute deep vein thrombosis (DVT) of right lower extremity (HCC) 2016   ADHD    Adopted    ANA positive    Anemia    Anxiety    Arthritis    Asthma    B12 deficiency    Bilateral hand pain    Bilateral swelling of feet and ankles    Bipolar 1 disorder (HCC)    Carpal tunnel syndrome of left wrist    Chest pain    Chronic diarrhea 09/07/2015   Chronic diastolic (congestive) heart failure (HCC)    Chronic left shoulder pain    Chronic venous insufficiency 04/26/2016   Clostridioides difficile infection 04/06/2019   Depression    Diverticulosis    Edema leg    GERD (gastroesophageal reflux disease)    Heart murmur    Helicobacter pylori (H. pylori) infection 08/24/2015   Hiatal hernia    History of 2019 novel coronavirus disease (COVID-19) 09/09/2020   a.) PCR testing (+):  09/09/2020, 01/12/2021, 07/17/2021   HLD (hyperlipidemia)    Hx MRSA infection    Hypothyroidism    Influenza A 10/01/2022   Knee effusion    Knee pain    Lipoma of arm 05/11/2013   Lower extremity edema    Lumbago    Lymphedema 04/26/2016   Migraine with aura    Multinodular goiter    Nephrolithiasis    Plantar fascial fibromatosis    Post traumatic stress disorder (PTSD)    Pre-diabetes    PVD (peripheral vascular disease) (HCC)    Seasonal allergies    Shortness of breath on exertion    Stress incontinence    Urge incontinence    Venous insufficiency    Venous ulcer (HCC)    Vitamin D  deficiency     Past Surgical History:  Procedure Laterality Date   ABDOMINAL HYSTERECTOMY     ANKLE SURGERY     CHOLECYSTECTOMY     COLONOSCOPY WITH PROPOFOL  N/A 06/24/2019   Procedure: COLONOSCOPY WITH PROPOFOL ;  Surgeon: Toledo, Ladell POUR, MD;  Location: [facility];  Service: Gastroenterology;  Laterality: N/A;   CYSTOSCOPY N/A 04/09/2016   Procedure: CYSTOSCOPY;  Surgeon: Glendia Elizabeth, MD;  Location: [facility];  Service: Urology;  Laterality: N/A;   DILATION AND CURETTAGE OF UTERUS     ESOPHAGOGASTRODUODENOSCOPY (EGD) WITH PROPOFOL  N/A 06/24/2019   Procedure: ESOPHAGOGASTRODUODENOSCOPY (EGD)  WITH PROPOFOL ;  Surgeon: Toledo, Ladell POUR, MD;  Location: [facility];  Service: Gastroenterology;  Laterality: N/A;   INGUINAL HERNIA REPAIR Bilateral 04/18/2017   Procedure: LAPAROSCOPIC BILATERAL INGUINAL HERNIA REPAIR;  Surgeon: Jordis Laneta FALCON, MD;  Location: [facility];  Service: General;  Laterality: Bilateral;   INTERSTIM IMPLANT PLACEMENT N/A 11/26/2016   Procedure: RENNA IMPLANT FIRST STAGE;  Surgeon: Gaston Hamilton, MD;  Location: [facility];  Service: Urology;  Laterality: N/A;   INTERSTIM IMPLANT PLACEMENT N/A 11/26/2016   Procedure: RENNA IMPLANT SECOND STAGE;  Surgeon: Gaston Hamilton, MD;  Location: [facility];  Service: Urology;  Laterality: N/A;   INTERSTIM IMPLANT  PLACEMENT N/A 02/18/2023   Procedure: RENNA IMPLANT FIRST STAGE WITH IMPEDANCE CHECK;  Surgeon: Gaston Hamilton, MD;  Location: [facility];  Service: Urology;  Laterality: N/A;   INTERSTIM IMPLANT PLACEMENT N/A 02/18/2023   Procedure: RENNA IMPLANT SECOND STAGE;  Surgeon: Gaston Hamilton, MD;  Location: [facility];  Service: Urology;  Laterality: N/A;   INTERSTIM IMPLANT REMOVAL N/A 02/18/2023   Procedure: REMOVAL OF INTERSTIM IMPLANT;  Surgeon: Gaston Hamilton, MD;  Location: [facility];  Service: Urology;  Laterality: N/A;   JOINT REPLACEMENT Right    knee   KNEE ARTHROSCOPY Bilateral    KNEE ARTHROSCOPY WITH LATERAL RELEASE Left 10/18/2015   Procedure: KNEE ARTHROSCOPY LATERAL AND PARTIAL SYNOVECTOMY;  Surgeon: Ozell Flake, MD;  Location: [facility];  Service: Orthopedics;  Laterality: Left;   Lymph Node removal  2015   Neck   PUBOVAGINAL SLING N/A 04/09/2016   Procedure: PUBO-VAGINAL SLING/ RETROPUBIC SLING;  Surgeon: Hamilton Gaston, MD;  Location: [facility];  Service: Urology;  Laterality: N/A;   REPLACEMENT TOTAL KNEE Right    SHOULDER SURGERY Right 2014   TUBAL LIGATION     WRIST SURGERY Right    metal plate    Social History   Socioeconomic History   Marital status: Divorced    Spouse name: Not on file   Number of children: Not on file   Years of education: Not on file   Highest education level: Not on file  Occupational History   Occupation: Customer Service    Employer: [employer]  Tobacco Use   Smoking status: Former    Current packs/day: 0.00    Average packs/day: 0.5 packs/day for 11.0 years (5.5 ttl pk-yrs)    Types: Cigarettes    Start date: 07/16/2008    Quit date: 07/16/2019    Years since quitting: 4.0    Passive exposure: Past   Smokeless tobacco: Former   Tobacco comments:    STRESS RELATED  Vaping Use   Vaping status: Never Used  Substance and Sexual Activity   Alcohol use: Not Currently    Comment: Hisotry of ETOH abouse ASB patient   Drug use: Not  Currently    Comment: Pt listed history of abuse of prescription drugs   Sexual activity: Not Currently  Other Topics Concern   Not on file  Social History Narrative   Not on file   Social Drivers of Health   Financial Resource Strain: Not on file  Food Insecurity: Not on file  Transportation Needs: Not on file  Physical Activity: Not on file  Stress: Not on file  Social Connections: Not on file  Intimate Partner Violence: Not on file    Family History  Adopted: Yes  Family history unknown: Yes    Allergies  Allergen Reactions   Geodon [Ziprasidone Hydrochloride] Other (See Comments)    Numbness ,sob, headaches,  blurred vision   Sulfacetamide Sodium Hives   Ziprasidone Anaphylaxis    Other reaction(s): Other (See Comments) Numbness ,sob, headaches, blurred vision  Other reaction(s): Other (See Comments), Unknown Numbness ,sob, headaches, blurred vision Numbness ,sob, headaches, blurred vision  Other reaction(s): Other (See Comments), Unknown Numbness ,sob, headaches, blurred vision Numbness ,sob, headaches, blurred vision   Numbness ,sob, headaches, blurred vision Other reaction(s): Other (See Comments), Unknown Numbness ,sob, headaches, blurred vision Numbness ,sob, headaches, blurred vision Other reaction(s): Other (See Comments), Unknown Numbness ,sob, headaches, blurred vision Numbness ,sob, headaches, blurred vision   Ziprasidone Hcl Anaphylaxis and Other (See Comments)    Other reaction(s): Other (See Comments), Unknown Numbness ,sob, headaches, blurred vision Numbness ,sob, headaches, blurred vision   Ace Inhibitors Rash   Erythromycin Rash and Hives   Hydromorphone  Rash   Lamictal [Lamotrigine] Rash    Other reaction(s): Unknown   Other Rash    rash   Strawberry (Diagnostic) Rash   Sulfa Antibiotics Hives and Rash    Other reaction(s): Unknown  Other reaction(s): Unknown Other reaction(s): Unknown Other reaction(s): Unknown    Outpatient  Medications Prior to Visit  Medication Sig   albuterol  (VENTOLIN  HFA) 108 (90 Base) MCG/ACT inhaler Inhale 1 puff into the lungs every 6 (six) hours as needed for wheezing or shortness of breath.   ARIPiprazole  (ABILIFY ) 30 MG tablet Take 30 mg by mouth daily.   aspirin  81 MG chewable tablet Aspirin  81 MG Oral Tablet Chewable QTY: 30 tablet Days: 30 Refills: 0  Written: 01/23/21 Patient Instructions: once a day   Budeson-Glycopyrrol-Formoterol  (BREZTRI  AEROSPHERE) 160-9-4.8 MCG/ACT AERO Inhale 2 puffs into the lungs in the morning and at bedtime.   celecoxib  (CELEBREX ) 100 MG capsule Take 100 mg by mouth 2 (two) times daily.   colestipol  (COLESTID ) 1 g tablet TAKE 2 TABLETS(2 GRAMS) BY MOUTH TWICE DAILY   colestipol  (COLESTID ) 1 g tablet TAKE 2 TABLETS(2 GRAMS) BY MOUTH TWICE DAILY   cyanocobalamin  (VITAMIN B12) 500 MCG tablet Take 1 tablet (500 mcg total) by mouth daily.   dicyclomine (BENTYL) 10 MG capsule TAKE 1 CAPSULE BY MOUTH EVERY 8 HOURS AS NEEDED FOR ABDOMINAL PAIN   DULoxetine  (CYMBALTA ) 60 MG capsule Take 60 mg by mouth 2 (two) times daily.   empagliflozin  (JARDIANCE ) 25 MG TABS tablet Take 1 tablet (25 mg total) by mouth daily.   Evolocumab (REPATHA SURECLICK) 140 MG/ML SOAJ Inject into the skin.   fexofenadine  (ALLEGRA ) 180 MG tablet Take 1 tablet (180 mg total) by mouth daily.   gabapentin  (NEURONTIN ) 600 MG tablet Take 600 mg by mouth 2 (two) times daily.    hydrOXYzine  (ATARAX /VISTARIL ) 25 MG tablet Take 50 mg by mouth every 8 (eight) hours as needed for itching.   KLOR-CON  M10 10 MEQ tablet Take 10 mEq by mouth daily.   levothyroxine  (SYNTHROID ) 200 MCG tablet PLEASE SEE ATTACHED FOR DETAILED DIRECTIONS   levothyroxine  (SYNTHROID ) 25 MCG tablet Take 1 tablet (25 mcg total) by mouth daily before breakfast. Along with 200mg  dose to equal 225mcg daily in the AM   lisinopril  (ZESTRIL ) 10 MG tablet Take 1 tablet (10 mg total) by mouth daily.   metFORMIN  (GLUCOPHAGE ) 500 MG tablet  Take 1 tablet (500 mg total) by mouth 2 (two) times daily with a meal.   methocarbamol  (ROBAXIN ) 500 MG tablet Take 1 tablet (500 mg total) by mouth 3 (three) times daily.   metoprolol  tartrate (LOPRESSOR ) 50 MG tablet Take 50 mg by mouth daily in the afternoon.  montelukast (SINGULAIR) 10 MG tablet Take 10 mg by mouth daily.   nortriptyline  (PAMELOR ) 10 MG capsule Take 10 mg in the morning   nystatin  powder Apply 1 Application topically 3 (three) times daily.   potassium chloride  SA (KLOR-CON  M) 20 MEQ tablet Take 1 tablet (20 mEq total) by mouth 2 (two) times daily.   rosuvastatin  (CRESTOR ) 40 MG tablet Take 40 mg by mouth daily.   Semaglutide  (RYBELSUS ) 3 MG TABS Take 1 tablet (3 mg total) by mouth daily.   spironolactone  (ALDACTONE ) 25 MG tablet TAKE 1/2 (HALF) TABLET BY MOUTH ONCE DAILY.   SUBOXONE 8-2 MG FILM Dissolve 1 film under tongue twice daily   Vitamin D , Ergocalciferol , (DRISDOL ) 1.25 MG (50000 UNIT) CAPS capsule 1 po q wed and 1 po q sun   esomeprazole  (NEXIUM ) 40 MG capsule Take 1 capsule (40 mg total) by mouth 2 (two) times daily before a meal.   famotidine  (PEPCID ) 20 MG tablet Take 1 tablet (20 mg total) by mouth 2 (two) times daily.   liothyronine  (CYTOMEL ) 5 MCG tablet Take 10 mcg by mouth daily.   No facility-administered medications prior to visit.    Review of Systems  Constitutional: Negative.   HENT:  Positive for congestion, ear pain, sinus pressure, sinus pain, sneezing and sore throat.   Eyes: Negative.   Respiratory:  Positive for cough and sputum production. Negative for shortness of breath.   Cardiovascular: Negative.  Negative for chest pain.  Gastrointestinal: Negative.  Negative for abdominal pain, constipation and diarrhea.  Genitourinary: Negative.   Musculoskeletal:  Negative for joint pain and myalgias.  Skin: Negative.   Neurological: Negative.  Negative for dizziness and headaches.  Endo/Heme/Allergies: Negative.   All other systems reviewed  and are negative.      Objective:   BP 132/84   Pulse 70   Ht 5' 4 (1.626 m)   Wt 246 lb (111.6 kg)   SpO2 95%   BMI 42.23 kg/m   Vitals:   07/16/23 1030  BP: 132/84  Pulse: 70  Height: 5' 4 (1.626 m)  Weight: 246 lb (111.6 kg)  SpO2: 95%  BMI (Calculated): 42.21    Physical Exam Vitals and nursing note reviewed.  Constitutional:      Appearance: Normal appearance. She is normal weight.  HENT:     Head: Normocephalic and atraumatic.     Nose: Nose normal.     Mouth/Throat:     Mouth: Mucous membranes are moist.  Eyes:     Extraocular Movements: Extraocular movements intact.     Conjunctiva/sclera: Conjunctivae normal.     Pupils: Pupils are equal, round, and reactive to light.  Cardiovascular:     Rate and Rhythm: Normal rate and regular rhythm.     Pulses: Normal pulses.     Heart sounds: Normal heart sounds.  Pulmonary:     Effort: Pulmonary effort is normal.     Breath sounds: Normal breath sounds.  Abdominal:     General: Abdomen is flat. Bowel sounds are normal.     Palpations: Abdomen is soft.  Musculoskeletal:        General: Normal range of motion.     Cervical back: Normal range of motion.  Skin:    General: Skin is warm and dry.  Neurological:     General: No focal deficit present.     Mental Status: She is alert and oriented to person, place, and time.  Psychiatric:  Mood and Affect: Mood normal.        Behavior: Behavior normal.        Thought Content: Thought content normal.        Judgment: Judgment normal.      No results found for any visits on 07/16/23.  Recent Results (from the past 2160 hours)  TSH+T4F+T3Free     Status: Abnormal   Collection Time: 04/23/23  4:16 PM  Result Value Ref Range   TSH 7.360 (H) 0.450 - 4.500 uIU/mL   T3, Free 2.7 2.0 - 4.4 pg/mL   Free T4 0.78 (L) 0.82 - 1.77 ng/dL  Allergen food profile specific IgE (13996)     Status: Abnormal   Collection Time: 06/21/23 11:56 AM  Result Value Ref  Range   Egg White IgE 0.12 (A) Class 0/I kU/L   Milk IgE 0.16 (A) Class 0/I kU/L   Codfish IgE <0.10 Class 0 kU/L   Wheat IgE 0.23 (A) Class 0/I kU/L   Allergen Corn, IgE <0.10 Class 0 kU/L   Peanut IgE <0.10 Class 0 kU/L   Soybean IgE <0.10 Class 0 kU/L   Shrimp IgE 0.14 (A) Class 0/I kU/L   Allergen Tomato, IgE <0.10 Class 0 kU/L   Orange <0.10 Class 0 kU/L   Tuna <0.10 Class 0 kU/L   Allergen Apple, IgE <0.10 Class 0 kU/L   Chicken IgE <0.10 Class 0 kU/L  Allergens w/Comp Rflx Area 2     Status: Abnormal   Collection Time: 06/21/23 11:56 AM  Result Value Ref Range   Class Description Allergens Comment     Comment:     Levels of Specific IgE       Class  Description of Class     ---------------------------  -----  --------------------                    < 0.10         0         Negative            0.10 -    0.31         0/I       Equivocal/Low            0.32 -    0.55         I         Low            0.56 -    1.40         II        Moderate            1.41 -    3.90         III       High            3.91 -   19.00         IV        Very High           19.01 -  100.00         V         Very High                   >100.00         VI        Very High    IgE (Immunoglobulin E), Serum 305 6 - 495 IU/mL   D Pteronyssinus  IgE 41.60 (A) Class V kU/L   D Farinae IgE 33.70 (A) Class V kU/L   E001-IgE Cat Dander <0.10 Class 0 kU/L   E005-IgE Dog Dander <0.10 Class 0 kU/L   Bermuda Grass IgE 0.27 (A) Class 0/I kU/L   Timothy Grass IgE <0.10 Class 0 kU/L   Johnson Grass IgE <0.10 Class 0 kU/L   Cockroach, German IgE 0.73 (A) Class II kU/L   Penicillium Chrysogen IgE <0.10 Class 0 kU/L   Cladosporium Herbarum IgE <0.10 Class 0 kU/L   Aspergillus Fumigatus IgE <0.10 Class 0 kU/L   Alternaria Alternata IgE <0.10 Class 0 kU/L   Maple/Box Elder IgE <0.10 Class 0 kU/L   Common Silver Valrie IgE 0.11 (A) Class 0/I kU/L   Cedar, [place] IgE 0.17 (A) Class 0/I kU/L   Oak, White IgE 0.11  (A) Class 0/I kU/L   Elm, American IgE 0.34 (A) Class I kU/L   Cottonwood IgE 0.11 (A) Class 0/I kU/L   Pecan, Hickory IgE 0.23 (A) Class 0/I kU/L   White Mulberry IgE <0.10 Class 0 kU/L   Ragweed, Short IgE 0.24 (A) Class 0/I kU/L   Pigweed, Rough IgE 0.16 (A) Class 0/I kU/L   Sheep Sorrel IgE Qn 0.13 (A) Class 0/I kU/L   Mouse Urine IgE <0.10 Class 0 kU/L      Assessment & Plan:  Mucinex Flonase  Augmentin  Increase fluid intake  Problem List Items Addressed This Visit       Respiratory   Acute non-recurrent maxillary sinusitis - Primary   Relevant Medications   fluticasone  (FLONASE ) 50 MCG/ACT nasal spray   amoxicillin -clavulanate (AUGMENTIN ) 875-125 MG tablet    Return if symptoms worsen or fail to improve.   Total time spent: 25 minutes  Google, NP  07/16/2023   This document may have been prepared by Dragon Voice Recognition software and as such may include unintentional dictation errors.

## 2023-07-18 ENCOUNTER — Ambulatory Visit: Payer: 59 | Admitting: Cardiology

## 2023-07-24 DIAGNOSIS — Z79899 Other long term (current) drug therapy: Secondary | ICD-10-CM | POA: Diagnosis not present

## 2023-07-24 DIAGNOSIS — G8929 Other chronic pain: Secondary | ICD-10-CM | POA: Diagnosis not present

## 2023-07-24 DIAGNOSIS — F17200 Nicotine dependence, unspecified, uncomplicated: Secondary | ICD-10-CM | POA: Diagnosis not present

## 2023-07-31 DIAGNOSIS — Z79899 Other long term (current) drug therapy: Secondary | ICD-10-CM | POA: Diagnosis not present

## 2023-08-01 DIAGNOSIS — G4733 Obstructive sleep apnea (adult) (pediatric): Secondary | ICD-10-CM | POA: Diagnosis not present

## 2023-08-06 ENCOUNTER — Encounter: Payer: Self-pay | Admitting: Family

## 2023-08-06 ENCOUNTER — Ambulatory Visit (INDEPENDENT_AMBULATORY_CARE_PROVIDER_SITE_OTHER): Payer: 59 | Admitting: Family

## 2023-08-06 VITALS — BP 116/80 | HR 62 | Temp 97.9°F | Ht 64.0 in | Wt 254.8 lb

## 2023-08-06 DIAGNOSIS — R7303 Prediabetes: Secondary | ICD-10-CM

## 2023-08-06 DIAGNOSIS — L209 Atopic dermatitis, unspecified: Secondary | ICD-10-CM

## 2023-08-06 DIAGNOSIS — K529 Noninfective gastroenteritis and colitis, unspecified: Secondary | ICD-10-CM

## 2023-08-06 DIAGNOSIS — R053 Chronic cough: Secondary | ICD-10-CM

## 2023-08-06 DIAGNOSIS — Z0130 Encounter for examination of blood pressure without abnormal findings: Secondary | ICD-10-CM | POA: Diagnosis not present

## 2023-08-06 DIAGNOSIS — J4521 Mild intermittent asthma with (acute) exacerbation: Secondary | ICD-10-CM | POA: Diagnosis not present

## 2023-08-06 DIAGNOSIS — E038 Other specified hypothyroidism: Secondary | ICD-10-CM

## 2023-08-06 MED ORDER — TRIAMCINOLONE ACETONIDE 0.1 % EX CREA: 1.0000 | TOPICAL_CREAM | Freq: Two times a day (BID) | CUTANEOUS | 3 refills | Status: AC

## 2023-08-07 LAB — TSH+T4F+T3FREE
Free T4: 0.7 ng/dL — ABNORMAL LOW (ref 0.82–1.77)
T3, Free: 2.9 pg/mL (ref 2.0–4.4)
TSH: 10.3 u[IU]/mL — ABNORMAL HIGH (ref 0.450–4.500)

## 2023-08-07 LAB — CELIAC DISEASE AB SCREEN W/RFX
Antigliadin Abs, IgA: 6 U (ref 0–19)
IgA/Immunoglobulin A, Serum: 258 mg/dL (ref 87–352)
Transglutaminase IgA: <2 U/mL (ref 0–3)

## 2023-08-09 DIAGNOSIS — K529 Noninfective gastroenteritis and colitis, unspecified: Secondary | ICD-10-CM | POA: Diagnosis not present

## 2023-08-12 LAB — PANCREATIC ELASTASE, FECAL: Pancreatic Elastase, Fecal: >800 ug Elast./g (ref >200)

## 2023-08-13 LAB — STOOL CULTURE: E coli, Shiga toxin Assay: NEGATIVE

## 2023-08-13 LAB — OVA AND PARASITE EXAMINATION

## 2023-08-16 DIAGNOSIS — J439 Emphysema, unspecified: Secondary | ICD-10-CM | POA: Diagnosis not present

## 2023-08-16 DIAGNOSIS — J209 Acute bronchitis, unspecified: Secondary | ICD-10-CM | POA: Diagnosis not present

## 2023-08-19 ENCOUNTER — Encounter: Payer: Self-pay | Admitting: Family

## 2023-08-27 ENCOUNTER — Encounter: Payer: Self-pay | Admitting: Family

## 2023-08-27 ENCOUNTER — Ambulatory Visit (INDEPENDENT_AMBULATORY_CARE_PROVIDER_SITE_OTHER): Payer: 59 | Admitting: Family

## 2023-08-27 VITALS — BP 102/74 | HR 67 | Ht 64.0 in | Wt 253.6 lb

## 2023-08-27 DIAGNOSIS — H579 Unspecified disorder of eye and adnexa: Secondary | ICD-10-CM

## 2023-08-27 DIAGNOSIS — J309 Allergic rhinitis, unspecified: Secondary | ICD-10-CM | POA: Diagnosis not present

## 2023-08-27 DIAGNOSIS — R7303 Prediabetes: Secondary | ICD-10-CM

## 2023-08-27 DIAGNOSIS — J0100 Acute maxillary sinusitis, unspecified: Secondary | ICD-10-CM

## 2023-08-27 DIAGNOSIS — Z0130 Encounter for examination of blood pressure without abnormal findings: Secondary | ICD-10-CM

## 2023-08-27 MED ORDER — ZEPBOUND 2.5 MG/0.5ML ~~LOC~~ SOAJ: 2.5000 mg | SUBCUTANEOUS | 0 refills | Status: DC

## 2023-08-29 ENCOUNTER — Ambulatory Visit: Payer: 59 | Admitting: Cardiology

## 2023-08-29 ENCOUNTER — Encounter: Payer: Self-pay | Admitting: Cardiology

## 2023-08-29 ENCOUNTER — Ambulatory Visit
Admission: RE | Admit: 2023-08-29 | Discharge: 2023-08-29 | Disposition: A | Discharge status: Home / Self Care | Payer: 59 | Attending: Cardiology | Admitting: Cardiology

## 2023-08-29 ENCOUNTER — Ambulatory Visit
Admission: RE | Admit: 2023-08-29 | Discharge: 2023-08-29 | Disposition: A | Discharge status: Home / Self Care | Payer: 59 | Source: Ambulatory Visit | Attending: Cardiology | Admitting: Cardiology

## 2023-08-29 VITALS — BP 116/64 | HR 69 | Ht 64.0 in | Wt 249.0 lb

## 2023-08-29 DIAGNOSIS — M25521 Pain in right elbow: Secondary | ICD-10-CM

## 2023-08-29 DIAGNOSIS — Z0130 Encounter for examination of blood pressure without abnormal findings: Secondary | ICD-10-CM

## 2023-08-29 NOTE — Progress Notes (Signed)
Established Patient Office Visit  Subjective:  Patient ID: Alexis Christensen, female    DOB: October 30, 1970  Age: 53 y.o. MRN: [id number]  Chief Complaint  Patient presents with   Acute Visit    Fall, Painful Right Elbow & L Hand Cut    Patient in office for an acute visit. Patient fell, painful right elbow and cut on left hand. Patient states she was chasing after her grandson's stroller yesterday when she tripped and fell, landing on her right knee and right elbow, left hand. Small open area on left palm, no erythema or edema of hand, no bleeding. Right knee scraped, no bleeding or erythema. Mild edema. Right elbow pain on palpation and with movement. Patient has been taking ibuprofen and tylenol to help with pain. Will order an xray to rule out fracture.   Fall The accident occurred 12 to 24 hours ago. The fall occurred while walking. She fell from a height of 3 to 5 ft. She landed on [place]. There was no blood loss. The point of impact was the right elbow and right knee. The pain is present in the right elbow and right knee. The pain is at a severity of 7/10. The pain is mild. The symptoms are aggravated by use of injured limb, pressure on injury and extension. Pertinent negatives include no abdominal pain or headaches. She has tried acetaminophen and NSAID for the symptoms. The treatment provided no relief.    No other concerns at this time.   Past Medical History:  Diagnosis Date   Acute deep vein thrombosis (DVT) of right lower extremity (HCC) 2016   ADHD    Adopted    ANA positive    Anemia    Anxiety    Arthritis    Asthma    B12 deficiency    Bilateral hand pain    Bilateral swelling of feet and ankles    Bipolar 1 disorder (HCC)    Carpal tunnel syndrome of left wrist    Chest pain    Chronic diarrhea 09/07/2015   Chronic diastolic (congestive) heart failure (HCC)    Chronic left shoulder pain    Chronic venous insufficiency 04/26/2016   Clostridioides difficile  infection 04/06/2019   Depression    Diverticulosis    Edema leg    GERD (gastroesophageal reflux disease)    Heart murmur    Helicobacter pylori (H. pylori) infection 08/24/2015   Hiatal hernia    History of 2019 novel coronavirus disease (COVID-19) 09/09/2020   a.) PCR testing (+): 09/09/2020, 01/12/2021, 07/17/2021   HLD (hyperlipidemia)    Hx MRSA infection    Hypothyroidism    Influenza A 10/01/2022   Knee effusion    Knee pain    Lipoma of arm 05/11/2013   Lower extremity edema    Lumbago    Lymphedema 04/26/2016   Migraine with aura    Multinodular goiter    Nephrolithiasis    Plantar fascial fibromatosis    Post traumatic stress disorder (PTSD)    Pre-diabetes    PVD (peripheral vascular disease) (HCC)    Seasonal allergies    Shortness of breath on exertion    Stress incontinence    Urge incontinence    Venous insufficiency    Venous ulcer (HCC)    Vitamin D deficiency     Past Surgical History:  Procedure Laterality Date   ABDOMINAL HYSTERECTOMY     ANKLE SURGERY     CHOLECYSTECTOMY     COLONOSCOPY WITH  PROPOFOL N/A 06/24/2019   Procedure: COLONOSCOPY WITH PROPOFOL;  Surgeon: Toledo, Boykin Nearing, MD;  Location: [facility];  Service: Gastroenterology;  Laterality: N/A;   CYSTOSCOPY N/A 04/09/2016   Procedure: CYSTOSCOPY;  Surgeon: Alfredo Martinez, MD;  Location: [facility];  Service: Urology;  Laterality: N/A;   DILATION AND CURETTAGE OF UTERUS     ESOPHAGOGASTRODUODENOSCOPY (EGD) WITH PROPOFOL N/A 06/24/2019   Procedure: ESOPHAGOGASTRODUODENOSCOPY (EGD) WITH PROPOFOL;  Surgeon: Toledo, Boykin Nearing, MD;  Location: [facility];  Service: Gastroenterology;  Laterality: N/A;   INGUINAL HERNIA REPAIR Bilateral 04/18/2017   Procedure: LAPAROSCOPIC BILATERAL INGUINAL HERNIA REPAIR;  Surgeon: Leafy Ro, MD;  Location: [facility];  Service: General;  Laterality: Bilateral;   INTERSTIM IMPLANT PLACEMENT N/A 11/26/2016   Procedure: Leane Platt IMPLANT FIRST STAGE;   Surgeon: Alfredo Martinez, MD;  Location: [facility];  Service: Urology;  Laterality: N/A;   INTERSTIM IMPLANT PLACEMENT N/A 11/26/2016   Procedure: Leane Platt IMPLANT SECOND STAGE;  Surgeon: Alfredo Martinez, MD;  Location: [facility];  Service: Urology;  Laterality: N/A;   INTERSTIM IMPLANT PLACEMENT N/A 02/18/2023   Procedure: Leane Platt IMPLANT FIRST STAGE WITH IMPEDANCE CHECK;  Surgeon: Alfredo Martinez, MD;  Location: [facility];  Service: Urology;  Laterality: N/A;   INTERSTIM IMPLANT PLACEMENT N/A 02/18/2023   Procedure: Leane Platt IMPLANT SECOND STAGE;  Surgeon: Alfredo Martinez, MD;  Location: [facility];  Service: Urology;  Laterality: N/A;   INTERSTIM IMPLANT REMOVAL N/A 02/18/2023   Procedure: REMOVAL OF INTERSTIM IMPLANT;  Surgeon: Alfredo Martinez, MD;  Location: [facility];  Service: Urology;  Laterality: N/A;   JOINT REPLACEMENT Right    knee   KNEE ARTHROSCOPY Bilateral    KNEE ARTHROSCOPY WITH LATERAL RELEASE Left 10/18/2015   Procedure: KNEE ARTHROSCOPY LATERAL AND PARTIAL SYNOVECTOMY;  Surgeon: Kennedy Bucker, MD;  Location: [facility];  Service: Orthopedics;  Laterality: Left;   Lymph Node removal  2015   Neck   PUBOVAGINAL SLING N/A 04/09/2016   Procedure: PUBO-VAGINAL SLING/ RETROPUBIC SLING;  Surgeon: Alfredo Martinez, MD;  Location: [facility];  Service: Urology;  Laterality: N/A;   REPLACEMENT TOTAL KNEE Right    SHOULDER SURGERY Right 2014   TUBAL LIGATION     WRIST SURGERY Right    metal plate    Social History   Socioeconomic History   Marital status: Divorced    Spouse name: Not on file   Number of children: Not on file   Years of education: Not on file   Highest education level: Not on file  Occupational History   Occupation: Customer Service    Employer: [employer]  Tobacco Use   Smoking status: Former    Current packs/day: 0.00    Average packs/day: 0.5 packs/day for 11.0 years (5.5 ttl pk-yrs)    Types: Cigarettes    Start date: 07/16/2008    Quit date: 07/16/2019     Years since quitting: 4.1    Passive exposure: Past   Smokeless tobacco: Former   Tobacco comments:    STRESS RELATED  Vaping Use   Vaping status: Never Used  Substance and Sexual Activity   Alcohol use: Not Currently    Comment: Hisotry of ETOH abouse ASB patient   Drug use: Not Currently    Comment: Pt listed history of abuse of "prescription drugs"   Sexual activity: Not Currently  Other Topics Concern   Not on file  Social History Narrative   Not on file   Social Drivers of Health   Financial Resource Strain: Low Risk  (  08/16/2023)   Received from [facility]   Overall Financial Resource Strain (CARDIA)    Difficulty of Paying Living Expenses: Not very hard  Food Insecurity: No Food Insecurity (08/16/2023)   Received from [facility]   Hunger Vital Sign    Worried About Running Out of Food in the Last Year: Never true    Ran Out of Food in the Last Year: Never true  Transportation Needs: No Transportation Needs (08/16/2023)   Received from [facility] - Transportation    In the past 12 months, has lack of transportation kept you from medical appointments or from getting medications?: No    Lack of Transportation (Non-Medical): No  Physical Activity: Not on file  Stress: Not on file  Social Connections: Not on file  Intimate Partner Violence: Not on file    Family History  Adopted: Yes  Family history unknown: Yes    Allergies  Allergen Reactions   Geodon [Ziprasidone Hydrochloride] Other (See Comments)    Numbness ,sob, headaches, blurred vision   Sulfacetamide Sodium Hives   Ziprasidone Anaphylaxis    Other reaction(s): Other (See Comments) Numbness ,sob, headaches, blurred vision  Other reaction(s): Other (See Comments), Unknown Numbness ,sob, headaches, blurred vision Numbness ,sob, headaches, blurred vision  Other reaction(s): Other (See Comments), Unknown Numbness ,sob, headaches, blurred  vision Numbness ,sob, headaches, blurred vision   Numbness ,sob, headaches, blurred vision Other reaction(s): Other (See Comments), Unknown Numbness ,sob, headaches, blurred vision Numbness ,sob, headaches, blurred vision Other reaction(s): Other (See Comments), Unknown Numbness ,sob, headaches, blurred vision Numbness ,sob, headaches, blurred vision   Ziprasidone Hcl Anaphylaxis and Other (See Comments)    Other reaction(s): Other (See Comments), Unknown Numbness ,sob, headaches, blurred vision Numbness ,sob, headaches, blurred vision   Ace Inhibitors Rash   Erythromycin Rash and Hives   Hydromorphone Rash   Lamictal [Lamotrigine] Rash    Other reaction(s): Unknown   Other Rash    rash   Strawberry (Diagnostic) Rash   Sulfa Antibiotics Hives and Rash    Other reaction(s): Unknown  Other reaction(s): Unknown Other reaction(s): Unknown Other reaction(s): Unknown    Outpatient Medications Prior to Visit  Medication Sig   albuterol (VENTOLIN HFA) 108 (90 Base) MCG/ACT inhaler Inhale 1 puff into the lungs every 6 (six) hours as needed for wheezing or shortness of breath.   ARIPiprazole (ABILIFY) 30 MG tablet Take 30 mg by mouth daily.   aspirin 81 MG chewable tablet Aspirin 81 MG Oral Tablet Chewable QTY: 30 tablet Days: 30 Refills: 0  Written: 01/23/21 Patient Instructions: once a day   Budeson-Glycopyrrol-Formoterol (BREZTRI AEROSPHERE) 160-9-4.8 MCG/ACT AERO Inhale 2 puffs into the lungs in the morning and at bedtime.   celecoxib (CELEBREX) 100 MG capsule Take 100 mg by mouth 2 (two) times daily.   colestipol (COLESTID) 1 g tablet TAKE 2 TABLETS(2 GRAMS) BY MOUTH TWICE DAILY   colestipol (COLESTID) 1 g tablet TAKE 2 TABLETS(2 GRAMS) BY MOUTH TWICE DAILY   cyanocobalamin (VITAMIN B12) 500 MCG tablet Take 1 tablet (500 mcg total) by mouth daily.   dicyclomine (BENTYL) 10 MG capsule TAKE 1 CAPSULE BY MOUTH EVERY 8 HOURS AS NEEDED FOR ABDOMINAL PAIN   DULoxetine (CYMBALTA) 60 MG  capsule Take 60 mg by mouth 2 (two) times daily.   empagliflozin (JARDIANCE) 25 MG TABS tablet Take 1 tablet (25 mg total) by mouth daily.   esomeprazole (NEXIUM) 40 MG capsule Take 1 capsule (40 mg  total) by mouth 2 (two) times daily before a meal.   Evolocumab (REPATHA SURECLICK) 140 MG/ML SOAJ Inject into the skin.   famotidine (PEPCID) 20 MG tablet Take 1 tablet (20 mg total) by mouth 2 (two) times daily.   fexofenadine (ALLEGRA) 180 MG tablet Take 1 tablet (180 mg total) by mouth daily.   fluticasone (FLONASE) 50 MCG/ACT nasal spray Place 1 spray into both nostrils daily.   gabapentin (NEURONTIN) 600 MG tablet Take 600 mg by mouth 2 (two) times daily.    hydrOXYzine (ATARAX/VISTARIL) 25 MG tablet Take 50 mg by mouth every 8 (eight) hours as needed for itching.   KLOR-CON M10 10 MEQ tablet Take 10 mEq by mouth daily.   levothyroxine (SYNTHROID) 200 MCG tablet PLEASE SEE ATTACHED FOR DETAILED DIRECTIONS   levothyroxine (SYNTHROID) 25 MCG tablet Take 1 tablet (25 mcg total) by mouth daily before breakfast. Along with 200mg  dose to equal daily in the AM   liothyronine (CYTOMEL) 5 MCG tablet Take 10 mcg by mouth daily.   lisinopril (ZESTRIL) 10 MG tablet Take 1 tablet (10 mg total) by mouth daily.   metFORMIN (GLUCOPHAGE) 500 MG tablet Take 1 tablet (500 mg total) by mouth 2 (two) times daily with a meal.   methocarbamol (ROBAXIN) 500 MG tablet Take 1 tablet (500 mg total) by mouth 3 (three) times daily.   metoprolol tartrate (LOPRESSOR) 50 MG tablet Take 50 mg by mouth daily in the afternoon.   montelukast (SINGULAIR) 10 MG tablet Take 10 mg by mouth daily.   nortriptyline (PAMELOR) 10 MG capsule Take 10 mg in the morning   nystatin powder Apply 1 Application topically 3 (three) times daily.   potassium chloride SA (KLOR-CON M) 20 MEQ tablet Take 1 tablet (20 mEq total) by mouth 2 (two) times daily.   rosuvastatin (CRESTOR) 40 MG tablet Take 40 mg by mouth daily.   spironolactone  (ALDACTONE) 25 MG tablet TAKE 1/2 (HALF) TABLET BY MOUTH ONCE DAILY.   SUBOXONE 8-2 MG FILM Dissolve 1 film under tongue twice daily   tirzepatide (ZEPBOUND) 2.5 MG/0.5ML Pen Inject 2.5 mg into the skin once a week.   triamcinolone cream (KENALOG) 0.1 % Apply 1 Application topically 2 (two) times daily.   Vitamin D, Ergocalciferol, (DRISDOL) 1.25 MG (50000 UNIT) CAPS capsule 1 po q wed and 1 po q sun   No facility-administered medications prior to visit.    Review of Systems  Constitutional: Negative.   HENT: Negative.    Eyes: Negative.   Respiratory: Negative.  Negative for shortness of breath.   Cardiovascular: Negative.  Negative for chest pain.  Gastrointestinal: Negative.  Negative for abdominal pain, constipation and diarrhea.  Genitourinary: Negative.   Musculoskeletal:  Positive for joint pain. Negative for myalgias.       Right elbow pain  Skin: Negative.   Neurological: Negative.  Negative for dizziness and headaches.  Endo/Heme/Allergies: Negative.   All other systems reviewed and are negative.      Objective:   BP 116/64   Pulse 69   Ht 5\' 4"  (1.626 m)   Wt 249 lb (112.9 kg)   SpO2 96%   BMI 42.74 kg/m   Vitals:   08/29/23 1328  BP: 116/64  Pulse: 69  Height: 5\' 4"  (1.626 m)  Weight: 249 lb (112.9 kg)  SpO2: 96%  BMI (Calculated): 42.72    Physical Exam Vitals and nursing note reviewed.  Constitutional:      Appearance: Normal appearance. She is  normal weight.  HENT:     Head: Normocephalic and atraumatic.     Nose: Nose normal.     Mouth/Throat:     Mouth: Mucous membranes are moist.  Eyes:     Extraocular Movements: Extraocular movements intact.     Conjunctiva/sclera: Conjunctivae normal.     Pupils: Pupils are equal, round, and reactive to light.  Cardiovascular:     Rate and Rhythm: Normal rate and regular rhythm.     Pulses: Normal pulses.     Heart sounds: Normal heart sounds.  Pulmonary:     Effort: Pulmonary effort is normal.      Breath sounds: Normal breath sounds.  Abdominal:     General: Abdomen is flat. Bowel sounds are normal.     Palpations: Abdomen is soft.  Musculoskeletal:        General: Normal range of motion.     Right hand: Laceration present.       Arms:     Cervical back: Normal range of motion.  Skin:    General: Skin is warm and dry.  Neurological:     General: No focal deficit present.     Mental Status: She is alert and oriented to person, place, and time.  Psychiatric:        Mood and Affect: Mood normal.        Behavior: Behavior normal.        Thought Content: Thought content normal.        Judgment: Judgment normal.      No results found for any visits on 08/29/23.  Recent Results (from the past 2160 hours)  Allergen food profile specific IgE [phone])     Status: Abnormal   Collection Time: 06/21/23 11:56 AM  Result Value Ref Range   Egg White IgE 0.12 (A) Class 0/I kU/L   Milk IgE 0.16 (A) Class 0/I kU/L   Codfish IgE <0.10 Class 0 kU/L   Wheat IgE 0.23 (A) Class 0/I kU/L   Allergen Corn, IgE <0.10 Class 0 kU/L   Peanut IgE <0.10 Class 0 kU/L   Soybean IgE <0.10 Class 0 kU/L   Shrimp IgE 0.14 (A) Class 0/I kU/L   Allergen Tomato, IgE <0.10 Class 0 kU/L   Orange <0.10 Class 0 kU/L   Tuna <0.10 Class 0 kU/L   Allergen Apple, IgE <0.10 Class 0 kU/L   Chicken IgE <0.10 Class 0 kU/L  Allergens w/Comp Rflx Area 2     Status: Abnormal   Collection Time: 06/21/23 11:56 AM  Result Value Ref Range   Class Description Allergens Comment     Comment:     Levels of Specific IgE       Class  Description of Class     ---------------------------  -----  --------------------                    < 0.10         0         Negative            0.10 -    0.31         0/I       Equivocal/Low            0.32 -    0.55         I         Low            0.56 -  1.40         II        Moderate            1.41 -    3.90         III       High            3.91 -   19.00         IV        Very  High           19.01 -  100.00         V         Very High                   >100.00         VI        Very High    IgE (Immunoglobulin E), Serum 305 6 - 495 IU/mL   D Pteronyssinus IgE 41.60 (A) Class V kU/L   D Farinae IgE 33.70 (A) Class V kU/L   E001-IgE Cat Dander <0.10 Class 0 kU/L   E005-IgE Dog Dander <0.10 Class 0 kU/L   [place] Grass IgE 0.27 (A) Class 0/I kU/L   Timothy Grass IgE <0.10 Class 0 kU/L   Johnson Grass IgE <0.10 Class 0 kU/L   Cockroach, German IgE 0.73 (A) Class II kU/L   Penicillium Chrysogen IgE <0.10 Class 0 kU/L   Cladosporium Herbarum IgE <0.10 Class 0 kU/L   Aspergillus Fumigatus IgE <0.10 Class 0 kU/L   Alternaria Alternata IgE <0.10 Class 0 kU/L   Maple/Box Elder IgE <0.10 Class 0 kU/L   Common Silver Charletta Cousin IgE 0.11 (A) Class 0/I kU/L   Cedar, [place] IgE 0.17 (A) Class 0/I kU/L   Oak, White IgE 0.11 (A) Class 0/I kU/L   Elm, American IgE 0.34 (A) Class I kU/L   Cottonwood IgE 0.11 (A) Class 0/I kU/L   Pecan, Hickory IgE 0.23 (A) Class 0/I kU/L   White Mulberry IgE <0.10 Class 0 kU/L   Ragweed, Short IgE 0.24 (A) Class 0/I kU/L   Pigweed, Rough IgE 0.16 (A) Class 0/I kU/L   Sheep Sorrel IgE Qn 0.13 (A) Class 0/I kU/L   Mouse Urine IgE <0.10 Class 0 kU/L  Celiac Disease Ab Screen w/Rfx     Status: None   Collection Time: 08/06/23 11:33 AM  Result Value Ref Range   Antigliadin Abs, IgA 6 0 - 19 units    Comment:                    Negative                   0 - 19                    Weak Positive             20 - 30                    Moderate to Strong Positive   >30    Transglutaminase IgA <2 0 - 3 U/mL    Comment:                               Negative        0 -  3  Weak Positive   4 - 10                               Positive           >10  Tissue Transglutaminase (tTG) has been identified  as the endomysial antigen.  Studies have demonstr-  ated that endomysial IgA antibodies have over 99%  specificity for  gluten sensitive enteropathy.    IgA/Immunoglobulin A, Serum 258 87 - 352 mg/dL  [id number]     Status: Abnormal   Collection Time: 08/06/23 11:33 AM  Result Value Ref Range   TSH 10.300 (H) 0.450 - 4.500 uIU/mL   T3, Free 2.9 2.0 - 4.4 pg/mL   Free T4 0.70 (L) 0.82 - 1.77 ng/dL  Pancreatic Elastase, Fecal     Status: None   Collection Time: 08/09/23  8:00 AM  Result Value Ref Range   Pancreatic Elastase, Fecal >800 >200 ug Elast./g    Comment:        Severe Pancreatic Insufficiency:          <100        Moderate Pancreatic Insufficiency:   100 - 200        Normal:                                   >200   Culture, Stool     Status: None   Collection Time: 08/09/23  8:00 AM   Specimen: Stool   ST  Result Value Ref Range   Salmonella/Shigella Screen Final report    Stool Culture result 1 (RSASHR) Comment     Comment: No Salmonella or Shigella recovered.   Campylobacter Culture Final report    Stool Culture result 1 (CMPCXR) Comment     Comment: No Campylobacter species isolated.   E coli, Shiga toxin Assay Negative Negative  Ova and parasite examination     Status: None   Collection Time: 08/09/23 11:20 AM   Specimen: Stool   ST  Result Value Ref Range   OVA + PARASITE EXAM Final report     Comment: These results were obtained using wet preparation(s) and trichrome stained smear. This test does not include testing for Cryptosporidium parvum, Cyclospora, or Microsporidia.    O&P result 1 Comment     Comment: No ova, cysts, or parasites seen. One negative specimen does not rule out the possibility of a parasitic infection.       Assessment & Plan:  Xray right elbow.  Continue tylenol and ibuprofen for pain.   Problem List Items Addressed This Visit   None Visit Diagnoses       Right elbow pain    -  Primary   Relevant Orders   DG Elbow 2 Views Right       Return if symptoms worsen or fail to improve.   Total time spent: 25 minutes  [employer],  NP  08/29/2023   This document may have been prepared by Dragon Voice Recognition software and as such may include unintentional dictation errors.

## 2023-09-03 ENCOUNTER — Telehealth: Payer: Self-pay | Admitting: Physician Assistant

## 2023-09-03 ENCOUNTER — Telehealth: Payer: Self-pay | Admitting: Family

## 2023-09-03 DIAGNOSIS — G4733 Obstructive sleep apnea (adult) (pediatric): Secondary | ICD-10-CM | POA: Diagnosis not present

## 2023-09-03 DIAGNOSIS — R059 Cough, unspecified: Secondary | ICD-10-CM | POA: Diagnosis not present

## 2023-09-03 DIAGNOSIS — J441 Chronic obstructive pulmonary disease with (acute) exacerbation: Secondary | ICD-10-CM | POA: Diagnosis not present

## 2023-09-03 NOTE — Telephone Encounter (Signed)
The patient called in and left a voicemail requesting to schedule her EGD. She stated that she was not able to get the EGD done last because personally reason. I call them to let her know that I receive the patient message, and sent to the nurse.

## 2023-09-03 NOTE — Telephone Encounter (Signed)
Patient left VM wanting her x-ray results. Spoke to patient and advised her that the results aren't back yet and we will call her when we get them.

## 2023-09-04 ENCOUNTER — Telehealth: Payer: Self-pay

## 2023-09-04 ENCOUNTER — Encounter: Payer: Self-pay | Admitting: Oncology

## 2023-09-04 ENCOUNTER — Other Ambulatory Visit: Payer: Self-pay

## 2023-09-04 DIAGNOSIS — K219 Gastro-esophageal reflux disease without esophagitis: Secondary | ICD-10-CM

## 2023-09-04 NOTE — Telephone Encounter (Signed)
 Error

## 2023-09-11 ENCOUNTER — Encounter: Payer: Self-pay | Admitting: Family

## 2023-09-14 ENCOUNTER — Encounter: Payer: Self-pay | Admitting: Family

## 2023-09-14 NOTE — Progress Notes (Signed)
 Acute Office Visit  Subjective:     Patient ID: Alexis Christensen, female    DOB: [birth year], 53 y.o.   MRN: [id number]  Patient is in today for  Chief Complaint  Patient presents with   Acute Visit    Watery, itchy, swollen right eye and  sneezing.    Patient is here today with itchy, watery eyes, and a runny nose.   She has been having issues with this for a few weeks, but says taht it has gotten worse in the past few days. Known history of allergies, says these symptoms are similar to previous allergy exacerbations.   No other concerns today.     Review of Systems  Eyes:  Positive for discharge and redness.       Itching  Skin:  Positive for itching.  All other systems reviewed and are negative.       Objective:    BP 118/72   Pulse 70   Ht 5\' 4"  (1.626 m)   Wt 248 lb 6.4 oz (112.7 kg)   SpO2 95%   BMI 42.64 kg/m   Physical Exam Vitals and nursing note reviewed.  Constitutional:      Appearance: Normal appearance. She is normal weight.  HENT:     Head: Normocephalic.  Eyes:     General:        Left eye: Discharge present.    Extraocular Movements: Extraocular movements intact.     Conjunctiva/sclera:     Left eye: Left conjunctiva is injected.     Pupils: Pupils are equal, round, and reactive to light.  Cardiovascular:     Rate and Rhythm: Normal rate.  Pulmonary:     Effort: Pulmonary effort is normal.  Neurological:     General: No focal deficit present.     Mental Status: She is alert and oriented to person, place, and time. Mental status is at baseline.  Psychiatric:        Mood and Affect: Mood normal.        Behavior: Behavior normal.        Thought Content: Thought content normal.     Results for orders placed or performed in visit on 06/18/23  Allergen food profile specific IgE [phone])  Result Value Ref Range   Egg White IgE 0.12 (A) Class 0/I kU/L   Milk IgE 0.16 (A) Class 0/I kU/L   Codfish IgE <0.10 Class 0 kU/L   Wheat IgE 0.23  (A) Class 0/I kU/L   Allergen Corn, IgE <0.10 Class 0 kU/L   Peanut IgE <0.10 Class 0 kU/L   Soybean IgE <0.10 Class 0 kU/L   Shrimp IgE 0.14 (A) Class 0/I kU/L   Allergen Tomato, IgE <0.10 Class 0 kU/L   Orange <0.10 Class 0 kU/L   Tuna <0.10 Class 0 kU/L   Allergen Apple, IgE <0.10 Class 0 kU/L   Chicken IgE <0.10 Class 0 kU/L  Allergens w/Comp Rflx Area 2  Result Value Ref Range   Class Description Allergens Comment    IgE (Immunoglobulin E), Serum 305 6 - 495 IU/mL   D Pteronyssinus IgE 41.60 (A) Class V kU/L   D Farinae IgE 33.70 (A) Class V kU/L   E001-IgE Cat Dander <0.10 Class 0 kU/L   E005-IgE Dog Dander <0.10 Class 0 kU/L   [place] Grass IgE 0.27 (A) Class 0/I kU/L   Timothy Grass IgE <0.10 Class 0 kU/L   Johnson Grass IgE <0.10 Class 0 kU/L  Cockroach, [place] IgE 0.73 (A) Class II kU/L   Penicillium Chrysogen IgE <0.10 Class 0 kU/L   Cladosporium Herbarum IgE <0.10 Class 0 kU/L   Aspergillus Fumigatus IgE <0.10 Class 0 kU/L   Alternaria Alternata IgE <0.10 Class 0 kU/L   Maple/Box Elder IgE <0.10 Class 0 kU/L   Common Silver Charletta Cousin IgE 0.11 (A) Class 0/I kU/L   Cedar, [place] IgE 0.17 (A) Class 0/I kU/L   Oak, White IgE 0.11 (A) Class 0/I kU/L   Elm, American IgE 0.34 (A) Class I kU/L   [place] IgE 0.11 (A) Class 0/I kU/L   Pecan, [place] IgE 0.23 (A) Class 0/I kU/L   White Mulberry IgE <0.10 Class 0 kU/L   Ragweed, Short IgE 0.24 (A) Class 0/I kU/L   Pigweed, Rough IgE 0.16 (A) Class 0/I kU/L   Sheep Sorrel IgE Qn 0.13 (A) Class 0/I kU/L   Mouse Urine IgE <0.10 Class 0 kU/L    Recent Results (from the past 2160 hours)  Allergen food profile specific IgE (22025)     Status: Abnormal   Collection Time: 06/21/23 11:56 AM  Result Value Ref Range   Egg White IgE 0.12 (A) Class 0/I kU/L   Milk IgE 0.16 (A) Class 0/I kU/L   Codfish IgE <0.10 Class 0 kU/L   Wheat IgE 0.23 (A) Class 0/I kU/L   Allergen Corn, IgE <0.10 Class 0 kU/L   Peanut IgE <0.10 Class 0  kU/L   Soybean IgE <0.10 Class 0 kU/L   Shrimp IgE 0.14 (A) Class 0/I kU/L   Allergen Tomato, IgE <0.10 Class 0 kU/L   Orange <0.10 Class 0 kU/L   Tuna <0.10 Class 0 kU/L   Allergen Apple, IgE <0.10 Class 0 kU/L   Chicken IgE <0.10 Class 0 kU/L  Allergens w/Comp Rflx Area 2     Status: Abnormal   Collection Time: 06/21/23 11:56 AM  Result Value Ref Range   Class Description Allergens Comment     Comment:     Levels of Specific IgE       Class  Description of Class     ---------------------------  -----  --------------------                    < 0.10         0         Negative            0.10 -    0.31         0/I       Equivocal/Low            0.32 -    0.55         I         Low            0.56 -    1.40         II        Moderate            1.41 -    3.90         III       High            3.91 -   19.00         IV        Very High           19.01 -  100.00  V         Very High                   >100.00         VI        Very High    IgE (Immunoglobulin E), Serum 305 6 - 495 IU/mL   D Pteronyssinus IgE 41.60 (A) Class V kU/L   D Farinae IgE 33.70 (A) Class V kU/L   E001-IgE Cat Dander <0.10 Class 0 kU/L   E005-IgE Dog Dander <0.10 Class 0 kU/L   [place] Grass IgE 0.27 (A) Class 0/I kU/L   Timothy Grass IgE <0.10 Class 0 kU/L   Johnson Grass IgE <0.10 Class 0 kU/L   Cockroach, [place] IgE 0.73 (A) Class II kU/L   Penicillium Chrysogen IgE <0.10 Class 0 kU/L   Cladosporium Herbarum IgE <0.10 Class 0 kU/L   Aspergillus Fumigatus IgE <0.10 Class 0 kU/L   Alternaria Alternata IgE <0.10 Class 0 kU/L   Maple/Box Elder IgE <0.10 Class 0 kU/L   Common Silver Charletta Cousin IgE 0.11 (A) Class 0/I kU/L   Cedar, [place] IgE 0.17 (A) Class 0/I kU/L   Oak, White IgE 0.11 (A) Class 0/I kU/L   Elm, American IgE 0.34 (A) Class I kU/L   [place] IgE 0.11 (A) Class 0/I kU/L   Pecan, [place] IgE 0.23 (A) Class 0/I kU/L   White Mulberry IgE <0.10 Class 0 kU/L   Ragweed, Short IgE 0.24 (A)  Class 0/I kU/L   Pigweed, Rough IgE 0.16 (A) Class 0/I kU/L   Sheep Sorrel IgE Qn 0.13 (A) Class 0/I kU/L   Mouse Urine IgE <0.10 Class 0 kU/L  Celiac Disease Ab Screen w/Rfx     Status: None   Collection Time: 08/06/23 11:33 AM  Result Value Ref Range   Antigliadin Abs, IgA 6 0 - 19 units    Comment:                    Negative                   0 - 19                    Weak Positive             20 - 30                    Moderate to Strong Positive   >30    Transglutaminase IgA <2 0 - 3 U/mL    Comment:                               Negative        0 -  3                               Weak Positive   4 - 10                               Positive           >10  Tissue Transglutaminase (tTG) has been identified  as the endomysial antigen.  Studies have demonstr-  ated that endomysial IgA antibodies have over 99%  specificity for gluten sensitive enteropathy.  IgA/Immunoglobulin A, Serum 258 87 - 352 mg/dL  [id number]     Status: Abnormal   Collection Time: 08/06/23 11:33 AM  Result Value Ref Range   TSH 10.300 (H) 0.450 - 4.500 uIU/mL   T3, Free 2.9 2.0 - 4.4 pg/mL   Free T4 0.70 (L) 0.82 - 1.77 ng/dL  Pancreatic Elastase, Fecal     Status: None   Collection Time: 08/09/23  8:00 AM  Result Value Ref Range   Pancreatic Elastase, Fecal >800 >200 ug Elast./g    Comment:        Severe Pancreatic Insufficiency:          <100        Moderate Pancreatic Insufficiency:   100 - 200        Normal:                                   >200   Culture, Stool     Status: None   Collection Time: 08/09/23  8:00 AM   Specimen: Stool   ST  Result Value Ref Range   Salmonella/Shigella Screen Final report    Stool Culture result 1 (RSASHR) Comment     Comment: No Salmonella or Shigella recovered.   Campylobacter Culture Final report    Stool Culture result 1 (CMPCXR) Comment     Comment: No Campylobacter species isolated.   E coli, Shiga toxin Assay Negative Negative  Ova and parasite  examination     Status: None   Collection Time: 08/09/23 11:20 AM   Specimen: Stool   ST  Result Value Ref Range   OVA + PARASITE EXAM Final report     Comment: These results were obtained using wet preparation(s) and trichrome stained smear. This test does not include testing for Cryptosporidium parvum, Cyclospora, or Microsporidia.    O&P result 1 Comment     Comment: No ova, cysts, or parasites seen. One negative specimen does not rule out the possibility of a parasitic infection.     Allergies as of 06/18/2023       Reactions   Geodon [ziprasidone Hydrochloride] Other (See Comments)   Numbness ,sob, headaches, blurred vision   Sulfacetamide Sodium Hives   Ziprasidone Anaphylaxis   Other reaction(s): Other (See Comments) Numbness ,sob, headaches, blurred vision Other reaction(s): Other (See Comments), Unknown Numbness ,sob, headaches, blurred vision Numbness ,sob, headaches, blurred vision Other reaction(s): Other (See Comments), Unknown Numbness ,sob, headaches, blurred vision Numbness ,sob, headaches, blurred vision Numbness ,sob, headaches, blurred vision Other reaction(s): Other (See Comments), Unknown Numbness ,sob, headaches, blurred vision Numbness ,sob, headaches, blurred vision Other reaction(s): Other (See Comments), Unknown Numbness ,sob, headaches, blurred vision Numbness ,sob, headaches, blurred vision   Ziprasidone Hcl Anaphylaxis, Other (See Comments)   Other reaction(s): Other (See Comments), Unknown Numbness ,sob, headaches, blurred vision Numbness ,sob, headaches, blurred vision   Ace Inhibitors Rash   Erythromycin Rash, Hives   Hydromorphone Rash   Lamictal [lamotrigine] Rash   Other reaction(s): Unknown   Other Rash   rash   Strawberry (diagnostic) Rash   Sulfa Antibiotics Hives, Rash   Other reaction(s): Unknown Other reaction(s): Unknown Other reaction(s): Unknown Other reaction(s): Unknown        Medication List        Accurate as  of June 18, 2023 11:59 PM. If you have any questions, ask your nurse or doctor.  STOP taking these medications    ketotifen 0.035 % ophthalmic solution Commonly known as: ZADITOR Stopped by: Miki Kins   meloxicam 15 MG tablet Commonly known as: MOBIC Stopped by: Miki Kins   nitrofurantoin (macrocrystal-monohydrate) 100 MG capsule Commonly known as: MACROBID Stopped by: Miki Kins   torsemide 20 MG tablet Commonly known as: DEMADEX Stopped by: Miki Kins       TAKE these medications    albuterol 108 (90 Base) MCG/ACT inhaler Commonly known as: VENTOLIN HFA Inhale 1 puff into the lungs every 6 (six) hours as needed for wheezing or shortness of breath.   ARIPiprazole 30 MG tablet Commonly known as: ABILIFY Take 30 mg by mouth daily.   aspirin 81 MG chewable tablet Aspirin 81 MG Oral Tablet Chewable QTY: 30 tablet Days: 30 Refills: 0  Written: 01/23/21 Patient Instructions: once a day   Breztri Aerosphere 160-9-4.8 MCG/ACT Aero Generic drug: Budeson-Glycopyrrol-Formoterol Inhale 2 puffs into the lungs in the morning and at bedtime.   celecoxib 100 MG capsule Commonly known as: CELEBREX Take 100 mg by mouth 2 (two) times daily.   colestipol 1 g tablet Commonly known as: COLESTID Take 2 tablets (2 g total) by mouth 2 (two) times daily.   cyanocobalamin 500 MCG tablet Commonly known as: VITAMIN B12 Take 1 tablet (500 mcg total) by mouth daily.   dicyclomine 10 MG capsule Commonly known as: BENTYL TAKE 1 CAPSULE BY MOUTH EVERY 8 HOURS AS NEEDED FOR ABDOMINAL PAIN   DULoxetine 60 MG capsule Commonly known as: CYMBALTA Take 60 mg by mouth 2 (two) times daily.   empagliflozin 25 MG Tabs tablet Commonly known as: Jardiance Take 1 tablet (25 mg total) by mouth daily.   esomeprazole 40 MG capsule Commonly known as: NexIUM Take 1 capsule (40 mg total) by mouth 2 (two) times daily before a meal.   famotidine 20 MG  tablet Commonly known as: PEPCID Take 1 tablet (20 mg total) by mouth 2 (two) times daily.   fexofenadine 180 MG tablet Commonly known as: ALLEGRA Take 1 tablet (180 mg total) by mouth daily. Started by: Miki Kins   gabapentin 600 MG tablet Commonly known as: NEURONTIN Take 600 mg by mouth 2 (two) times daily.   hydrOXYzine 25 MG tablet Commonly known as: ATARAX Take 50 mg by mouth every 8 (eight) hours as needed for itching.   Klor-Con M10 10 MEQ tablet Generic drug: potassium chloride Take 10 mEq by mouth daily.   potassium chloride SA 20 MEQ tablet Commonly known as: KLOR-CON M Take 1 tablet (20 mEq total) by mouth 2 (two) times daily.   levothyroxine 200 MCG tablet Commonly known as: SYNTHROID PLEASE SEE ATTACHED FOR DETAILED DIRECTIONS   levothyroxine 25 MCG tablet Commonly known as: SYNTHROID Take 1 tablet (25 mcg total) by mouth daily before breakfast. Along with 200mg  dose to equal daily in the AM   liothyronine 5 MCG tablet Commonly known as: CYTOMEL Take 10 mcg by mouth daily.   lisinopril 10 MG tablet Commonly known as: ZESTRIL Take 1 tablet (10 mg total) by mouth daily.   metFORMIN 500 MG tablet Commonly known as: GLUCOPHAGE Take 1 tablet (500 mg total) by mouth 2 (two) times daily with a meal.   methocarbamol 500 MG tablet Commonly known as: ROBAXIN Take 1 tablet (500 mg total) by mouth 3 (three) times daily.   metoprolol tartrate 50 MG tablet Commonly known as: LOPRESSOR Take 50 mg by mouth daily  in the afternoon.   montelukast 10 MG tablet Commonly known as: SINGULAIR Take 10 mg by mouth daily.   nortriptyline 10 MG capsule Commonly known as: PAMELOR Take 10 mg in the morning   nystatin powder Commonly known as: nystatin Apply 1 Application topically 3 (three) times daily.   Repatha SureClick 140 MG/ML Soaj Generic drug: Evolocumab Inject into the skin.   rosuvastatin 40 MG tablet Commonly known as: CRESTOR Take 40 mg  by mouth daily.   Rybelsus 3 MG Tabs Generic drug: Semaglutide Take 1 tablet (3 mg total) by mouth daily.   spironolactone 25 MG tablet Commonly known as: ALDACTONE TAKE 1/2 (HALF) TABLET BY MOUTH ONCE DAILY.   Suboxone 8-2 MG Film Generic drug: Buprenorphine HCl-Naloxone HCl Dissolve 1 film under tongue twice daily   Vitamin D (Ergocalciferol) 1.25 MG (50000 UNIT) Caps capsule Commonly known as: DRISDOL 1 po q wed and 1 po q sun            Assessment & Plan:   Problem List Items Addressed This Visit       Respiratory   Allergic rhinitis   Other Visit Diagnoses       Itchy eyes    -  Primary   Relevant Medications   fexofenadine (ALLEGRA) 180 MG tablet   Other Relevant Orders   Allergen food profile specific IgE (04540) (Completed)   Allergens w/Comp Rflx Area 2 (Completed)     Sneezing       Relevant Medications   fexofenadine (ALLEGRA) 180 MG tablet   Other Relevant Orders   Allergen food profile specific IgE (98119) (Completed)   Allergens w/Comp Rflx Area 2 (Completed)      Checking allergy labs for patient.  Switching allergy meds for her as well.   Return in about 2 weeks (around 07/02/2023) for AWV.  Total time spent: 20 minutes  Miki Kins, FNP  06/18/2023   This document may have been prepared by [facility] Voice Recognition software and as such may include unintentional dictation errors.

## 2023-09-24 ENCOUNTER — Encounter: Payer: Self-pay | Admitting: Gastroenterology

## 2023-09-27 ENCOUNTER — Other Ambulatory Visit

## 2023-09-27 DIAGNOSIS — E038 Other specified hypothyroidism: Secondary | ICD-10-CM | POA: Diagnosis not present

## 2023-09-27 DIAGNOSIS — E782 Mixed hyperlipidemia: Secondary | ICD-10-CM

## 2023-09-27 DIAGNOSIS — R7303 Prediabetes: Secondary | ICD-10-CM | POA: Diagnosis not present

## 2023-09-28 LAB — CMP14+EGFR
ALT: 14 IU/L (ref 0–32)
AST: 19 IU/L (ref 0–40)
Albumin: 3.9 g/dL (ref 3.8–4.9)
Alkaline Phosphatase: 109 IU/L (ref 44–121)
BUN/Creatinine Ratio: 16 (ref 9–23)
BUN: 12 mg/dL (ref 6–24)
Bilirubin Total: 0.3 mg/dL (ref 0.0–1.2)
CO2: 28 mmol/L (ref 20–29)
Calcium: 8.7 mg/dL (ref 8.7–10.2)
Chloride: 104 mmol/L (ref 96–106)
Creatinine, Ser: 0.74 mg/dL (ref 0.57–1.00)
Globulin, Total: 2.5 g/dL (ref 1.5–4.5)
Glucose: 106 mg/dL — ABNORMAL HIGH (ref 70–99)
Potassium: 4 mmol/L (ref 3.5–5.2)
Sodium: 143 mmol/L (ref 134–144)
Total Protein: 6.4 g/dL (ref 6.0–8.5)
eGFR: 96 mL/min/{1.73_m2} (ref >59)

## 2023-09-28 LAB — CBC WITH DIFF/PLATELET
Basophils Absolute: 0.1 10*3/uL (ref 0.0–0.2)
Basos: 1 %
EOS (ABSOLUTE): 0.5 10*3/uL — ABNORMAL HIGH (ref 0.0–0.4)
Eos: 8 %
Hematocrit: 35.1 % (ref 34.0–46.6)
Hemoglobin: 11.1 g/dL (ref 11.1–15.9)
Immature Grans (Abs): 0 10*3/uL (ref 0.0–0.1)
Immature Granulocytes: 0 %
Lymphocytes Absolute: 1.4 10*3/uL (ref 0.7–3.1)
Lymphs: 21 %
MCH: 28.2 pg (ref 26.6–33.0)
MCHC: 31.6 g/dL (ref 31.5–35.7)
MCV: 89 fL (ref 79–97)
Monocytes Absolute: 0.6 10*3/uL (ref 0.1–0.9)
Monocytes: 9 %
Neutrophils Absolute: 4.1 10*3/uL (ref 1.4–7.0)
Neutrophils: 61 %
Platelets: 300 10*3/uL (ref 150–450)
RBC: 3.93 x10E6/uL (ref 3.77–5.28)
RDW: 13.1 % (ref 11.7–15.4)
WBC: 6.7 10*3/uL (ref 3.4–10.8)

## 2023-09-28 LAB — LIPID PANEL
Chol/HDL Ratio: 3.4 ratio (ref 0.0–4.4)
Cholesterol, Total: 188 mg/dL (ref 100–199)
HDL: 56 mg/dL (ref >39)
LDL Chol Calc (NIH): 116 mg/dL — ABNORMAL HIGH (ref 0–99)
Triglycerides: 88 mg/dL (ref 0–149)
VLDL Cholesterol Cal: 16 mg/dL (ref 5–40)

## 2023-09-28 LAB — TSH: TSH: 10.3 u[IU]/mL — ABNORMAL HIGH (ref 0.450–4.500)

## 2023-09-28 LAB — HEMOGLOBIN A1C
Est. average glucose Bld gHb Est-mCnc: 131 mg/dL
Hgb A1c MFr Bld: 6.2 % — ABNORMAL HIGH (ref 4.8–5.6)

## 2023-10-01 ENCOUNTER — Other Ambulatory Visit: Payer: Self-pay

## 2023-10-01 ENCOUNTER — Encounter
Admission: RE | Disposition: A | Discharge status: Home / Self Care | Payer: Self-pay | Source: Home / Self Care | Attending: Gastroenterology

## 2023-10-01 ENCOUNTER — Ambulatory Visit
Admission: RE | Admit: 2023-10-01 | Discharge: 2023-10-01 | Disposition: A | Discharge status: Home / Self Care | Payer: 59 | Attending: Gastroenterology | Admitting: Gastroenterology

## 2023-10-01 ENCOUNTER — Ambulatory Visit: Admitting: Anesthesiology

## 2023-10-01 ENCOUNTER — Encounter: Payer: Self-pay | Admitting: Gastroenterology

## 2023-10-01 DIAGNOSIS — I739 Peripheral vascular disease, unspecified: Secondary | ICD-10-CM | POA: Diagnosis not present

## 2023-10-01 DIAGNOSIS — K449 Diaphragmatic hernia without obstruction or gangrene: Secondary | ICD-10-CM | POA: Diagnosis not present

## 2023-10-01 DIAGNOSIS — J45909 Unspecified asthma, uncomplicated: Secondary | ICD-10-CM | POA: Diagnosis not present

## 2023-10-01 DIAGNOSIS — E039 Hypothyroidism, unspecified: Secondary | ICD-10-CM | POA: Insufficient documentation

## 2023-10-01 DIAGNOSIS — I110 Hypertensive heart disease with heart failure: Secondary | ICD-10-CM | POA: Diagnosis not present

## 2023-10-01 DIAGNOSIS — K219 Gastro-esophageal reflux disease without esophagitis: Secondary | ICD-10-CM | POA: Diagnosis not present

## 2023-10-01 DIAGNOSIS — G709 Myoneural disorder, unspecified: Secondary | ICD-10-CM | POA: Insufficient documentation

## 2023-10-01 DIAGNOSIS — I5032 Chronic diastolic (congestive) heart failure: Secondary | ICD-10-CM | POA: Diagnosis not present

## 2023-10-01 DIAGNOSIS — Z87891 Personal history of nicotine dependence: Secondary | ICD-10-CM | POA: Insufficient documentation

## 2023-10-01 DIAGNOSIS — N289 Disorder of kidney and ureter, unspecified: Secondary | ICD-10-CM | POA: Diagnosis not present

## 2023-10-01 HISTORY — PX: ESOPHAGOGASTRODUODENOSCOPY (EGD) WITH PROPOFOL: SHX5813

## 2023-10-01 SURGERY — ESOPHAGOGASTRODUODENOSCOPY (EGD) WITH PROPOFOL
Anesthesia: General

## 2023-10-01 MED ORDER — PROPOFOL 10 MG/ML IV BOLUS
INTRAVENOUS | Status: DC | PRN
  Administered 2023-10-01: 20 mg via INTRAVENOUS
  Administered 2023-10-01: 70 mg via INTRAVENOUS

## 2023-10-01 MED ORDER — LIDOCAINE HCL (CARDIAC) PF 100 MG/5ML IV SOSY
PREFILLED_SYRINGE | INTRAVENOUS | Status: DC | PRN
  Administered 2023-10-01: 100 mg via INTRAVENOUS

## 2023-10-01 MED ORDER — GLYCOPYRROLATE 0.2 MG/ML IJ SOLN
INTRAMUSCULAR | Status: DC | PRN
Start: 2023-10-01 — End: 2023-10-01
  Administered 2023-10-01: .2 mg via INTRAVENOUS

## 2023-10-01 MED ORDER — SODIUM CHLORIDE 0.9 % IV SOLN
INTRAVENOUS | Status: DC
Start: 2023-10-01 — End: 2023-10-01

## 2023-10-01 NOTE — Anesthesia Postprocedure Evaluation (Signed)
 Anesthesia Post Note  Patient: Alexis Christensen  Procedure(s) Performed: ESOPHAGOGASTRODUODENOSCOPY (EGD) WITH PROPOFOL  Patient location during evaluation: Endoscopy Anesthesia Type: General Level of consciousness: awake and alert Pain management: pain level controlled Vital Signs Assessment: post-procedure vital signs reviewed and stable Respiratory status: spontaneous breathing, nonlabored ventilation, respiratory function stable and patient connected to nasal cannula oxygen Cardiovascular status: blood pressure returned to baseline and stable Postop Assessment: no apparent nausea or vomiting Anesthetic complications: no   No notable events documented.   Last Vitals:  Vitals:   10/01/23 0922 10/01/23 0932  BP: (!) 127/94 119/82  Pulse: 81 75  Resp: 18 19  Temp: (!) 36.2 C   SpO2: 100% 99%    Last Pain:  Vitals:   10/01/23 0932  TempSrc:   PainSc: 0-No pain                 Cleda Mccreedy Stanislaus Kaltenbach

## 2023-10-01 NOTE — Anesthesia Procedure Notes (Signed)
 Procedure Name: General with mask airway Date/Time: 10/01/2023 9:13 AM  Performed by: Mohammed Kindle, CRNAPre-anesthesia Checklist: Patient identified, Emergency Drugs available, Suction available and Patient being monitored Patient Re-evaluated:Patient Re-evaluated prior to induction Oxygen Delivery Method: Simple face mask Induction Type: IV induction Placement Confirmation: positive ETCO2 and breath sounds checked- equal and bilateral Dental Injury: Teeth and Oropharynx as per pre-operative assessment

## 2023-10-01 NOTE — Transfer of Care (Signed)
 Immediate Anesthesia Transfer of Care Note  Patient: Alexis Christensen  Procedure(s) Performed: ESOPHAGOGASTRODUODENOSCOPY (EGD) WITH PROPOFOL  Patient Location: Endoscopy Unit  Anesthesia Type:General  Level of Consciousness: awake, drowsy, and patient cooperative  Airway & Oxygen Therapy: Patient Spontanous Breathing and Patient connected to face mask oxygen  Post-op Assessment: Report given to RN and Post -op Vital signs reviewed and stable  Post vital signs: Reviewed and stable  Last Vitals:  Vitals Value Taken Time  BP 127/94 10/01/23 0922  Temp 36.2 C 10/01/23 0922  Pulse 88 10/01/23 0926  Resp 19 10/01/23 0926  SpO2 100 % 10/01/23 0926  Vitals shown include unfiled device data.  Last Pain:  Vitals:   10/01/23 0922  TempSrc: Temporal  PainSc: 0-No pain         Complications: No notable events documented.

## 2023-10-01 NOTE — H&P (Signed)
 Wyline Mood, MD [address], [place], [place], [address] [address] [place] [address], [place], [place], [address] Phone: [phone]  Fax: [phone]  Primary Care Physician:  Miki Kins, FNP   Pre-Procedure History & Physical: HPI:  Alexis Christensen is a 53 y.o. female is here for an endoscopy    Past Medical History:  Diagnosis Date   Acute deep vein thrombosis (DVT) of right lower extremity (HCC) 2016   ADHD    Adopted    ANA positive    Anemia    Anxiety    Arthritis    Asthma    B12 deficiency    Bilateral hand pain    Bilateral swelling of feet and ankles    Bipolar 1 disorder (HCC)    Carpal tunnel syndrome of left wrist    Chest pain    Chronic diarrhea 09/07/2015   Chronic diastolic (congestive) heart failure (HCC)    Chronic left shoulder pain    Chronic venous insufficiency 04/26/2016   Clostridioides difficile infection 04/06/2019   Depression    Diverticulosis    Edema leg    GERD (gastroesophageal reflux disease)    Heart murmur    Helicobacter pylori (H. pylori) infection 08/24/2015   Hiatal hernia    History of 2019 novel coronavirus disease (COVID-19) 09/09/2020   a.) PCR testing (+): 09/09/2020, 01/12/2021, 07/17/2021   HLD (hyperlipidemia)    Hx MRSA infection    Hypothyroidism    Influenza A 10/01/2022   Knee effusion    Knee pain    Lipoma of arm 05/11/2013   Lower extremity edema    Lumbago    Lymphedema 04/26/2016   Migraine with aura    Multinodular goiter    Nephrolithiasis    Plantar fascial fibromatosis    Post traumatic stress disorder (PTSD)    Pre-diabetes    PVD (peripheral vascular disease) (HCC)    Seasonal allergies    Shortness of breath on exertion    Stress incontinence    Urge incontinence    Venous insufficiency    Venous ulcer (HCC)    Vitamin D deficiency     Past Surgical History:  Procedure Laterality Date   ABDOMINAL HYSTERECTOMY     ANKLE SURGERY     CHOLECYSTECTOMY      COLONOSCOPY WITH PROPOFOL N/A 06/24/2019   Procedure: COLONOSCOPY WITH PROPOFOL;  Surgeon: Toledo, Boykin Nearing, MD;  Location: [facility];  Service: Gastroenterology;  Laterality: N/A;   CYSTOSCOPY N/A 04/09/2016   Procedure: CYSTOSCOPY;  Surgeon: Alfredo Martinez, MD;  Location: [facility];  Service: Urology;  Laterality: N/A;   DILATION AND CURETTAGE OF UTERUS     ESOPHAGOGASTRODUODENOSCOPY (EGD) WITH PROPOFOL N/A 06/24/2019   Procedure: ESOPHAGOGASTRODUODENOSCOPY (EGD) WITH PROPOFOL;  Surgeon: Toledo, Boykin Nearing, MD;  Location: [facility];  Service: Gastroenterology;  Laterality: N/A;   INGUINAL HERNIA REPAIR Bilateral 04/18/2017   Procedure: LAPAROSCOPIC BILATERAL INGUINAL HERNIA REPAIR;  Surgeon: Leafy Ro, MD;  Location: [facility];  Service: General;  Laterality: Bilateral;   INTERSTIM IMPLANT PLACEMENT N/A 11/26/2016   Procedure: Leane Platt IMPLANT FIRST STAGE;  Surgeon: Alfredo Martinez, MD;  Location: [facility];  Service: Urology;  Laterality: N/A;   INTERSTIM IMPLANT PLACEMENT N/A 11/26/2016   Procedure: Leane Platt IMPLANT SECOND STAGE;  Surgeon: Alfredo Martinez, MD;  Location: [facility];  Service: Urology;  Laterality: N/A;   INTERSTIM IMPLANT PLACEMENT N/A 02/18/2023   Procedure: Leane Platt IMPLANT FIRST STAGE WITH IMPEDANCE CHECK;  Surgeon:  Alfredo Martinez, MD;  Location: [facility];  Service: Urology;  Laterality: N/A;   INTERSTIM IMPLANT PLACEMENT N/A 02/18/2023   Procedure: Leane Platt IMPLANT SECOND STAGE;  Surgeon: Alfredo Martinez, MD;  Location: [facility];  Service: Urology;  Laterality: N/A;   INTERSTIM IMPLANT REMOVAL N/A 02/18/2023   Procedure: REMOVAL OF INTERSTIM IMPLANT;  Surgeon: Alfredo Martinez, MD;  Location: [facility];  Service: Urology;  Laterality: N/A;   JOINT REPLACEMENT Right    knee   KNEE ARTHROSCOPY Bilateral    KNEE ARTHROSCOPY WITH LATERAL RELEASE Left 10/18/2015   Procedure: KNEE ARTHROSCOPY LATERAL AND PARTIAL SYNOVECTOMY;  Surgeon: Kennedy Bucker, MD;  Location:  [facility];  Service: Orthopedics;  Laterality: Left;   Lymph Node removal  2015   Neck   PUBOVAGINAL SLING N/A 04/09/2016   Procedure: PUBO-VAGINAL SLING/ RETROPUBIC SLING;  Surgeon: Alfredo Martinez, MD;  Location: [facility];  Service: Urology;  Laterality: N/A;   REPLACEMENT TOTAL KNEE Right    SHOULDER SURGERY Right 2014   TUBAL LIGATION     WRIST SURGERY Right    metal plate    Prior to Admission medications   Medication Sig Start Date End Date Taking? Authorizing Provider  albuterol (VENTOLIN HFA) 108 (90 Base) MCG/ACT inhaler Inhale 1 puff into the lungs every 6 (six) hours as needed for wheezing or shortness of breath.    [provider]  ARIPiprazole (ABILIFY) 30 MG tablet Take 30 mg by mouth daily. 09/04/22   [provider]  aspirin 81 MG chewable tablet Aspirin 81 MG Oral Tablet Chewable QTY: 30 tablet Days: 30 Refills: 0  Written: 01/23/21 Patient Instructions: once a day 01/23/21   [provider]  Budeson-Glycopyrrol-Formoterol (BREZTRI AEROSPHERE) 160-9-4.8 MCG/ACT AERO Inhale 2 puffs into the lungs in the morning and at bedtime.    [provider]  celecoxib (CELEBREX) 100 MG capsule Take 100 mg by mouth 2 (two) times daily. 03/26/23   [provider]  colestipol (COLESTID) 1 g tablet TAKE 2 TABLETS(2 GRAMS) BY MOUTH TWICE DAILY 06/24/23   Celso Amy, PA-C  colestipol (COLESTID) 1 g tablet TAKE 2 TABLETS(2 GRAMS) BY MOUTH TWICE DAILY 06/24/23   Celso Amy, PA-C  cyanocobalamin (VITAMIN B12) 500 MCG tablet Take 1 tablet (500 mcg total) by mouth daily. 03/14/23   Opalski, Gavin Pound, DO  dicyclomine (BENTYL) 10 MG capsule TAKE 1 CAPSULE BY MOUTH EVERY 8 HOURS AS NEEDED FOR ABDOMINAL PAIN 10/09/22   Orson Eva, NP  DULoxetine (CYMBALTA) 60 MG capsule Take 60 mg by mouth 2 (two) times daily.    [provider]  empagliflozin (JARDIANCE) 25 MG TABS tablet Take 1 tablet (25 mg total) by mouth daily. 04/11/23   Laurier Nancy, MD   esomeprazole (NEXIUM) 40 MG capsule Take 1 capsule (40 mg total) by mouth 2 (two) times daily before a meal. 11/28/22 06/18/23  Celso Amy, PA-C  Evolocumab (REPATHA SURECLICK) 140 MG/ML SOAJ Inject into the skin.    [provider]  famotidine (PEPCID) 20 MG tablet Take 1 tablet (20 mg total) by mouth 2 (two) times daily. 11/28/22 06/18/23  Celso Amy, PA-C  fexofenadine (ALLEGRA) 180 MG tablet Take 1 tablet (180 mg total) by mouth daily. 06/18/23   Miki Kins, FNP  fluticasone (FLONASE) 50 MCG/ACT nasal spray Place 1 spray into both nostrils daily. 07/16/23 07/15/24  Scoggins, Amber, NP  gabapentin (NEURONTIN) 600 MG tablet Take 600 mg by mouth 2 (two) times daily.     [provider]  hydrOXYzine (ATARAX/VISTARIL) 25 MG tablet Take 50 mg by mouth every 8 (eight) hours as needed for itching.    [provider]  KLOR-CON M10 10 MEQ tablet Take 10 mEq by mouth daily. 03/22/22   [provider]  levothyroxine (SYNTHROID) 200 MCG tablet PLEASE SEE ATTACHED FOR DETAILED DIRECTIONS 09/06/22   [provider]  levothyroxine (SYNTHROID) 25 MCG tablet Take 1 tablet (25 mcg total) by mouth daily before breakfast. Along with 200mg  dose to equal daily in the AM 04/30/23   Miki Kins, FNP  liothyronine (CYTOMEL) 5 MCG tablet Take 10 mcg by mouth daily. 05/22/22 06/18/23  [provider]  lisinopril (ZESTRIL) 10 MG tablet Take 1 tablet (10 mg total) by mouth daily. 04/11/23 04/10/24  Laurier Nancy, MD  metFORMIN (GLUCOPHAGE) 500 MG tablet Take 1 tablet (500 mg total) by mouth 2 (two) times daily with a meal. 05/02/23   Dalbert Garnet, Caren D, MD  methocarbamol (ROBAXIN) 500 MG tablet Take 1 tablet (500 mg total) by mouth 3 (three) times daily. 11/19/22   Orson Eva, NP  metoprolol tartrate (LOPRESSOR) 50 MG tablet Take 50 mg by mouth daily in the afternoon.    [provider]  montelukast (SINGULAIR) 10 MG tablet Take 10 mg by mouth  daily. 10/10/22   [provider]  nortriptyline (PAMELOR) 10 MG capsule Take 10 mg in the morning 09/11/22   [provider]  nystatin powder Apply 1 Application topically 3 (three) times daily. 10/23/22   Orson Eva, NP  potassium chloride SA (KLOR-CON M) 20 MEQ tablet Take 1 tablet (20 mEq total) by mouth 2 (two) times daily. 03/02/23   Dione Booze, MD  rosuvastatin (CRESTOR) 40 MG tablet Take 40 mg by mouth daily.    [provider]  spironolactone (ALDACTONE) 25 MG tablet TAKE 1/2 (HALF) TABLET BY MOUTH ONCE DAILY. 09/04/22   Laurier Nancy, MD  SUBOXONE 8-2 MG FILM Dissolve 1 film under tongue twice daily 09/14/16   [provider]  tirzepatide (ZEPBOUND) 2.5 MG/0.5ML Pen Inject 2.5 mg into the skin once a week. Patient not taking: Reported on 09/24/2023 08/27/23   Miki Kins, FNP  triamcinolone cream (KENALOG) 0.1 % Apply 1 Application topically 2 (two) times daily. 08/06/23   Miki Kins, FNP  Vitamin D, Ergocalciferol, (DRISDOL) 1.25 MG (50000 UNIT) CAPS capsule 1 po q wed and 1 po q sun 05/02/23   Quillian Quince D, MD    Allergies as of 09/04/2023 - Review Complete 08/29/2023  Allergen Reaction Noted   Geodon [ziprasidone hydrochloride] Other (See Comments) 06/14/2011   Sulfacetamide sodium Hives 05/26/2014   Ziprasidone Anaphylaxis 06/14/2011   Ziprasidone hcl Anaphylaxis and Other (See Comments) 01/04/2014   Ace inhibitors Rash 05/10/2015   Erythromycin Rash and Hives 03/27/2015   Hydromorphone Rash 05/10/2015   Lamictal [lamotrigine] Rash 01/04/2014   Other Rash 10/13/2015   Strawberry (diagnostic) Rash 10/13/2015   Sulfa antibiotics Hives and Rash 06/14/2011    Family History  Adopted: Yes  Family history unknown: Yes    Social History   Socioeconomic History   Marital status: Divorced    Spouse name: Not on file   Number of children: Not on file   Years of education: Not on file   Highest education level: Not on file   Occupational History   Occupation: Customer Service    Employer: [employer]  Tobacco Use   Smoking status: Former    Current packs/day: 0.00  Average packs/day: 0.5 packs/day for 11.0 years (5.5 ttl pk-yrs)    Types: Cigarettes    Start date: 07/16/2008    Quit date: 07/16/2019    Years since quitting: 4.2    Passive exposure: Past   Smokeless tobacco: Former   Tobacco comments:    STRESS RELATED  Vaping Use   Vaping status: Never Used  Substance and Sexual Activity   Alcohol use: Not Currently    Comment: Hisotry of ETOH abouse ASB patient   Drug use: Not Currently    Comment: Pt listed history of abuse of "prescription drugs"   Sexual activity: Not Currently  Other Topics Concern   Not on file  Social History Narrative   Not on file   Social Drivers of Health   Financial Resource Strain: Low Risk  (08/16/2023)   Received from [facility]   Overall Financial Resource Strain (CARDIA)    Difficulty of Paying Living Expenses: Not very hard  Food Insecurity: No Food Insecurity (08/16/2023)   Received from [facility]   Hunger Vital Sign    Worried About Running Out of Food in the Last Year: Never true    Ran Out of Food in the Last Year: Never true  Transportation Needs: No Transportation Needs (08/16/2023)   Received from [facility] - Transportation    In the past 12 months, has lack of transportation kept you from medical appointments or from getting medications?: No    Lack of Transportation (Non-Medical): No  Physical Activity: Not on file  Stress: Not on file  Social Connections: Not on file  Intimate Partner Violence: Not on file    Review of Systems: See HPI, otherwise negative ROS  Physical Exam: There were no vitals taken for this visit. General:   Alert,  pleasant and cooperative in NAD Head:  Normocephalic and atraumatic. Neck:  Supple; no masses or thyromegaly. Lungs:  Clear throughout to  auscultation, normal respiratory effort.    Heart:  +S1, +S2, Regular rate and rhythm, No edema. Abdomen:  Soft, nontender and nondistended. Normal bowel sounds, without guarding, and without rebound.   Neurologic:  Alert and  oriented x4;  grossly normal neurologically.  Impression/Plan: Alexis Christensen is here for an endoscopy  to be performed for  evaluation of GERD.     Risks, benefits, limitations, and alternatives regarding endoscopy have been reviewed with the patient.  Questions have been answered.  All parties agreeable.   Wyline Mood, MD  10/01/2023, 8:36 AM

## 2023-10-01 NOTE — Anesthesia Preprocedure Evaluation (Signed)
 Anesthesia Evaluation  Patient identified by MRN, date of birth, ID band Patient awake    Reviewed: Allergy & Precautions, NPO status , Patient's Chart, lab work & pertinent test results  History of Anesthesia Complications Negative for: history of anesthetic complications  Airway Mallampati: III  TM Distance: <3 FB Neck ROM: full    Dental  (+) Chipped, Poor Dentition, Missing   Pulmonary neg shortness of breath, asthma , former smoker   Pulmonary exam normal        Cardiovascular Exercise Tolerance: Good (-) angina + Peripheral Vascular Disease and +CHF  (-) Past MI Normal cardiovascular exam     Neuro/Psych  Headaches  Neuromuscular disease  negative psych ROS   GI/Hepatic Neg liver ROS, hiatal hernia,GERD  Controlled,,  Endo/Other  Hypothyroidism    Renal/GU Renal disease  negative genitourinary   Musculoskeletal   Abdominal   Peds  Hematology negative hematology ROS (+)   Anesthesia Other Findings Past Medical History: 2016: Acute deep vein thrombosis (DVT) of right lower extremity (HCC) No date: ADHD No date: Adopted No date: ANA positive No date: Anemia No date: Anxiety No date: Arthritis No date: Asthma No date: B12 deficiency No date: Bilateral hand pain No date: Bilateral swelling of feet and ankles No date: Bipolar 1 disorder (HCC) No date: Carpal tunnel syndrome of left wrist No date: Chest pain 09/07/2015: Chronic diarrhea No date: Chronic diastolic (congestive) heart failure (HCC) No date: Chronic left shoulder pain 04/26/2016: Chronic venous insufficiency 04/06/2019: Clostridioides difficile infection No date: Depression No date: Diverticulosis No date: Edema leg No date: GERD (gastroesophageal reflux disease) No date: Heart murmur 08/24/2015: Helicobacter pylori (H. pylori) infection No date: Hiatal hernia 09/09/2020: History of 2019 novel coronavirus disease (COVID-19)      Comment:  a.) PCR testing (+): 09/09/2020, 01/12/2021, 07/17/2021 No date: HLD (hyperlipidemia) No date: Hx MRSA infection No date: Hypothyroidism 10/01/2022: Influenza A No date: Knee effusion No date: Knee pain 05/11/2013: Lipoma of arm No date: Lower extremity edema No date: Lumbago 04/26/2016: Lymphedema No date: Migraine with aura No date: Multinodular goiter No date: Nephrolithiasis No date: Plantar fascial fibromatosis No date: Post traumatic stress disorder (PTSD) No date: Pre-diabetes No date: PVD (peripheral vascular disease) (HCC) No date: Seasonal allergies No date: Shortness of breath on exertion No date: Stress incontinence No date: Urge incontinence No date: Venous insufficiency No date: Venous ulcer (HCC) No date: Vitamin D deficiency  Past Surgical History: No date: ABDOMINAL HYSTERECTOMY No date: ANKLE SURGERY No date: CHOLECYSTECTOMY 06/24/2019: COLONOSCOPY WITH PROPOFOL; N/A     Comment:  Procedure: COLONOSCOPY WITH PROPOFOL;  Surgeon: Toledo,               Boykin Nearing, MD;  Location: [facility];  Service:               Gastroenterology;  Laterality: N/A; 04/09/2016: CYSTOSCOPY; N/A     Comment:  Procedure: CYSTOSCOPY;  Surgeon: Alfredo Martinez, MD;                Location: [facility];  Service: Urology;  Laterality: N/A; No date: DILATION AND CURETTAGE OF UTERUS 06/24/2019: ESOPHAGOGASTRODUODENOSCOPY (EGD) WITH PROPOFOL; N/A     Comment:  Procedure: ESOPHAGOGASTRODUODENOSCOPY (EGD) WITH               PROPOFOL;  Surgeon: Toledo, Boykin Nearing, MD;  Location:               [facility];  Service: Gastroenterology;  Laterality:  N/A; 04/18/2017: INGUINAL HERNIA REPAIR; Bilateral     Comment:  Procedure: LAPAROSCOPIC BILATERAL INGUINAL HERNIA               REPAIR;  Surgeon: Leafy Ro, MD;  Location: [facility]               [facility];  Service: General;  Laterality: Bilateral; 11/26/2016: Leane Platt IMPLANT PLACEMENT; N/A     Comment:  Procedure:  Leane Platt IMPLANT FIRST STAGE;  Surgeon:               Alfredo Martinez, MD;  Location: [facility];  Service:               Urology;  Laterality: N/A; 11/26/2016: Leane Platt IMPLANT PLACEMENT; N/A     Comment:  Procedure: INTERSTIM IMPLANT SECOND STAGE;  Surgeon:               Alfredo Martinez, MD;  Location: [facility];  Service:               Urology;  Laterality: N/A; 02/18/2023: INTERSTIM IMPLANT PLACEMENT; N/A     Comment:  Procedure: INTERSTIM IMPLANT FIRST STAGE WITH IMPEDANCE               CHECK;  Surgeon: Alfredo Martinez, MD;  Location: [facility]               [facility];  Service: Urology;  Laterality: N/A; 02/18/2023: INTERSTIM IMPLANT PLACEMENT; N/A     Comment:  Procedure: INTERSTIM IMPLANT SECOND STAGE;  Surgeon:               Alfredo Martinez, MD;  Location: [facility];  Service:               Urology;  Laterality: N/A; 02/18/2023: INTERSTIM IMPLANT REMOVAL; N/A     Comment:  Procedure: REMOVAL OF INTERSTIM IMPLANT;  Surgeon:               Alfredo Martinez, MD;  Location: [facility];  Service:               Urology;  Laterality: N/A; No date: JOINT REPLACEMENT; Right     Comment:  knee No date: KNEE ARTHROSCOPY; Bilateral 10/18/2015: KNEE ARTHROSCOPY WITH LATERAL RELEASE; Left     Comment:  Procedure: KNEE ARTHROSCOPY LATERAL AND PARTIAL               SYNOVECTOMY;  Surgeon: Kennedy Bucker, MD;  Location: [facility]               [facility];  Service: Orthopedics;  Laterality: Left; 2015: Lymph Node removal     Comment:  Neck 04/09/2016: PUBOVAGINAL SLING; N/A     Comment:  Procedure: PUBO-VAGINAL SLING/ RETROPUBIC SLING;                Surgeon: Alfredo Martinez, MD;  Location: [facility];                Service: Urology;  Laterality: N/A; No date: REPLACEMENT TOTAL KNEE; Right 2014: SHOULDER SURGERY; Right No date: TUBAL LIGATION No date: WRIST SURGERY; Right     Comment:  metal plate  BMI    Body Mass Index: 42.95 kg/m      Reproductive/Obstetrics negative OB ROS                              Anesthesia Physical Anesthesia Plan  ASA: 3  Anesthesia Plan: General   Post-op Pain  Management:    Induction: Intravenous  PONV Risk Score and Plan: Propofol infusion and TIVA  Airway Management Planned: Natural Airway and Nasal Cannula  Additional Equipment:   Intra-op Plan:   Post-operative Plan:   Informed Consent: I have reviewed the patients History and Physical, chart, labs and discussed the procedure including the risks, benefits and alternatives for the proposed anesthesia with the patient or authorized representative who has indicated his/her understanding and acceptance.     Dental Advisory Given  Plan Discussed with: Anesthesiologist, CRNA and Surgeon  Anesthesia Plan Comments: (Patient consented for risks of anesthesia including but not limited to:  - adverse reactions to medications - risk of airway placement if required - damage to eyes, teeth, lips or other oral mucosa - nerve damage due to positioning  - sore throat or hoarseness - Damage to heart, brain, nerves, lungs, other parts of body or loss of life  Patient voiced understanding and assent.)       Anesthesia Quick Evaluation

## 2023-10-01 NOTE — Op Note (Signed)
 [facility] Gastroenterology Patient Name: Alexis Christensen Procedure Date: 10/01/2023 8:53 AM MRN: [id number] Account #: [id number] Date of Birth: Nov 09, 1970 Admit Type: Outpatient Age: 53 Room: [facility] ENDO ROOM 2 Gender: Female Note Status: Finalized Instrument Name: Laurette Schimke [id number] Procedure:             Upper GI endoscopy Indications:           Follow-up of esophageal reflux Providers:             Wyline Mood MD, Grayling Congress MD Medicines:             Monitored Anesthesia Care Complications:         No immediate complications. Procedure:             Pre-Anesthesia Assessment:                        - Prior to the procedure, a History and Physical was                         performed, and patient medications, allergies and                         sensitivities were reviewed. The patient's tolerance                         of previous anesthesia was reviewed.                        - The risks and benefits of the procedure and the                         sedation options and risks were discussed with the                         patient. All questions were answered and informed                         consent was obtained.                        - ASA Grade Assessment: II - A patient with mild                         systemic disease.                        After obtaining informed consent, the endoscope was                         passed under direct vision. Throughout the procedure,                         the patient's blood pressure, pulse, and oxygen                         saturations were monitored continuously. The Endoscope                         was introduced through the mouth, and advanced to the  third part of duodenum. The upper GI endoscopy was                         accomplished with ease. The patient tolerated the                         procedure well. Findings:      The examined duodenum was normal.      The  esophagus was normal.      A 7 cm hiatal hernia was present.      The cardia and gastric fundus were normal on retroflexion. Impression:            - Normal examined duodenum.                        - Normal esophagus.                        - 7 cm hiatal hernia.                        - No specimens collected. Recommendation:        - Discharge patient to home (with escort).                        - Resume previous diet.                        - Continue present medications. Procedure Code(s):     --- Professional ---                        [phone], Esophagogastroduodenoscopy, flexible,                         transoral; diagnostic, including collection of                         specimen(s) by brushing or washing, when performed                         (separate procedure) Diagnosis Code(s):     --- Professional ---                        K44.9, Diaphragmatic hernia without obstruction or                         gangrene                        K21.9, Gastro-esophageal reflux disease without                         esophagitis CPT copyright 2022 [employer]. All rights reserved. The codes documented in this report are preliminary and upon coder review may  be revised to meet current compliance requirements. Wyline Mood, MD Wyline Mood MD, MD 10/01/2023 9:19:02 AM This report has been signed electronically. Number of Addenda: 0 Note Initiated On: 10/01/2023 8:53 AM Estimated Blood Loss:  Estimated blood loss: none.      [facility]

## 2023-10-06 ENCOUNTER — Encounter: Payer: Self-pay | Admitting: Family

## 2023-10-06 NOTE — Progress Notes (Signed)
 Acute Office Visit  Subjective:     Patient ID: Alexis Christensen, female    DOB: [birth year], 53 y.o.   MRN: [id number]  Patient is in today for  Chief Complaint  Patient presents with   Cough   Nasal Congestion   Diarrhea    Cough This is a recurrent problem. The current episode started 1 to 4 weeks ago. The cough is Non-productive. Nothing aggravates the symptoms. She has tried oral steroids, leukotriene antagonists, steroid inhaler, rest, OTC cough suppressant, body position changes, cool air and prescription cough suppressant for the symptoms. The treatment provided no relief.  Diarrhea  This is a chronic problem. The current episode started more than 1 year ago. The problem occurs 2 to 4 times per day. The problem has been waxing and waning. The stool consistency is described as Watery. Associated symptoms include coughing. Nothing aggravates the symptoms. There are no known risk factors. She has tried anti-motility drug, bismuth subsalicylate, change of diet, increased fluids, electrolyte solution and analgesics for the symptoms. The treatment provided no relief.     Review of Systems  Respiratory:  Positive for cough.   Gastrointestinal:  Positive for diarrhea.  All other systems reviewed and are negative.       Objective:    BP 116/80   Pulse 62   Temp 97.9 F (36.6 C) (Tympanic)   Ht 5\' 4"  (1.626 m)   Wt 254 lb 12.8 oz (115.6 kg)   SpO2 97%   BMI 43.74 kg/m   Physical Exam Vitals and nursing note reviewed.  Constitutional:      Appearance: Normal appearance. She is normal weight.  HENT:     Head: Normocephalic and atraumatic.     Right Ear: Tympanic membrane normal.     Left Ear: Tympanic membrane normal.     Nose: Congestion and rhinorrhea present.  Eyes:     Extraocular Movements: Extraocular movements intact.     Conjunctiva/sclera: Conjunctivae normal.     Pupils: Pupils are equal, round, and reactive to light.  Cardiovascular:     Rate and Rhythm:  Normal rate.  Pulmonary:     Effort: Pulmonary effort is normal.     Breath sounds: Normal breath sounds.  Neurological:     General: No focal deficit present.     Mental Status: She is alert and oriented to person, place, and time. Mental status is at baseline.  Psychiatric:        Mood and Affect: Mood normal.        Behavior: Behavior normal.        Thought Content: Thought content normal.     Results for orders placed or performed in visit on 08/06/23  Ova and parasite examination   Specimen: Stool   ST  Result Value Ref Range   OVA + PARASITE EXAM Final report    O&P result 1 Comment   Culture, Stool   Specimen: Stool   ST  Result Value Ref Range   Salmonella/Shigella Screen Final report    Stool Culture result 1 (RSASHR) Comment    Campylobacter Culture Final report    Stool Culture result 1 (CMPCXR) Comment    E coli, Shiga toxin Assay Negative Negative  Pancreatic Elastase, Fecal  Result Value Ref Range   Pancreatic Elastase, Fecal >800 >200 ug Elast./g  Celiac Disease Ab Screen w/Rfx  Result Value Ref Range   Antigliadin Abs, IgA 6 0 - 19 units   Transglutaminase IgA <2 0 -  3 U/mL   IgA/Immunoglobulin A, Serum 258 87 - 352 mg/dL  [id number]  Result Value Ref Range   TSH 10.300 (H) 0.450 - 4.500 uIU/mL   T3, Free 2.9 2.0 - 4.4 pg/mL   Free T4 0.70 (L) 0.82 - 1.77 ng/dL    Recent Results (from the past 2160 hours)  Celiac Disease Ab Screen w/Rfx     Status: None   Collection Time: 08/06/23 11:33 AM  Result Value Ref Range   Antigliadin Abs, IgA 6 0 - 19 units    Comment:                    Negative                   0 - 19                    Weak Positive             20 - 30                    Moderate to Strong Positive   >30    Transglutaminase IgA <2 0 - 3 U/mL    Comment:                               Negative        0 -  3                               Weak Positive   4 - 10                               Positive           >10  Tissue  Transglutaminase (tTG) has been identified  as the endomysial antigen.  Studies have demonstr-  ated that endomysial IgA antibodies have over 99%  specificity for gluten sensitive enteropathy.    IgA/Immunoglobulin A, Serum 258 87 - 352 mg/dL  NFA+[id number]     Status: Abnormal   Collection Time: 08/06/23 11:33 AM  Result Value Ref Range   TSH 10.300 (H) 0.450 - 4.500 uIU/mL   T3, Free 2.9 2.0 - 4.4 pg/mL   Free T4 0.70 (L) 0.82 - 1.77 ng/dL  Pancreatic Elastase, Fecal     Status: None   Collection Time: 08/09/23  8:00 AM  Result Value Ref Range   Pancreatic Elastase, Fecal >800 >200 ug Elast./g    Comment:        Severe Pancreatic Insufficiency:          <100        Moderate Pancreatic Insufficiency:   100 - 200        Normal:                                   >200   Culture, Stool     Status: None   Collection Time: 08/09/23  8:00 AM   Specimen: Stool   ST  Result Value Ref Range   Salmonella/Shigella Screen Final report    Stool Culture result 1 (RSASHR) Comment     Comment: No Salmonella or Shigella recovered.   Campylobacter Culture Final report  Stool Culture result 1 (CMPCXR) Comment     Comment: No Campylobacter species isolated.   E coli, Shiga toxin Assay Negative Negative  Ova and parasite examination     Status: None   Collection Time: 08/09/23 11:20 AM   Specimen: Stool   ST  Result Value Ref Range   OVA + PARASITE EXAM Final report     Comment: These results were obtained using wet preparation(s) and trichrome stained smear. This test does not include testing for Cryptosporidium parvum, Cyclospora, or Microsporidia.    O&P result 1 Comment     Comment: No ova, cysts, or parasites seen. One negative specimen does not rule out the possibility of a parasitic infection.   Hemoglobin A1c     Status: Abnormal   Collection Time: 09/27/23  9:39 AM  Result Value Ref Range   Hgb A1c MFr Bld 6.2 (H) 4.8 - 5.6 %    Comment:          Prediabetes: 5.7 - 6.4           Diabetes: >6.4          Glycemic control for adults with diabetes: <7.0    Est. average glucose Bld gHb Est-mCnc 131 mg/dL  TSH     Status: Abnormal   Collection Time: 09/27/23  9:39 AM  Result Value Ref Range   TSH 10.300 (H) 0.450 - 4.500 uIU/mL  CMP14+EGFR     Status: Abnormal   Collection Time: 09/27/23  9:39 AM  Result Value Ref Range   Glucose 106 (H) 70 - 99 mg/dL   BUN 12 6 - 24 mg/dL   Creatinine, Ser 6.04 0.57 - 1.00 mg/dL   eGFR 96 >54 UJ/WJX/9.14   BUN/Creatinine Ratio 16 9 - 23   Sodium 143 134 - 144 mmol/L   Potassium 4.0 3.5 - 5.2 mmol/L   Chloride 104 96 - 106 mmol/L   CO2 28 20 - 29 mmol/L   Calcium 8.7 8.7 - 10.2 mg/dL   Total Protein 6.4 6.0 - 8.5 g/dL   Albumin 3.9 3.8 - 4.9 g/dL   Globulin, Total 2.5 1.5 - 4.5 g/dL   Bilirubin Total 0.3 0.0 - 1.2 mg/dL   Alkaline Phosphatase 109 44 - 121 IU/L   AST 19 0 - 40 IU/L   ALT 14 0 - 32 IU/L  Lipid panel     Status: Abnormal   Collection Time: 09/27/23  9:39 AM  Result Value Ref Range   Cholesterol, Total 188 100 - 199 mg/dL   Triglycerides 88 0 - 149 mg/dL   HDL 56 >78 mg/dL   VLDL Cholesterol Cal 16 5 - 40 mg/dL   LDL Chol Calc (NIH) 295 (H) 0 - 99 mg/dL   Chol/HDL Ratio 3.4 0.0 - 4.4 ratio    Comment:                                   T. Chol/HDL Ratio                                             Men  Women                               1/2 Avg.Risk  3.4    3.3                                   Avg.Risk  5.0    4.4                                2X Avg.Risk  9.6    7.1                                3X Avg.Risk 23.4   11.0   CBC With Diff/Platelet     Status: Abnormal   Collection Time: 09/27/23  9:39 AM  Result Value Ref Range   WBC 6.7 3.4 - 10.8 x10E3/uL   RBC 3.93 3.77 - 5.28 x10E6/uL   Hemoglobin 11.1 11.1 - 15.9 g/dL   Hematocrit 16.1 09.6 - 46.6 %   MCV 89 79 - 97 fL   MCH 28.2 26.6 - 33.0 pg   MCHC 31.6 31.5 - 35.7 g/dL   RDW 04.5 40.9 - 81.1 %   Platelets 300 150 - 450 x10E3/uL    Neutrophils 61 Not Estab. %   Lymphs 21 Not Estab. %   Monocytes 9 Not Estab. %   Eos 8 Not Estab. %   Basos 1 Not Estab. %   Neutrophils Absolute 4.1 1.4 - 7.0 x10E3/uL   Lymphocytes Absolute 1.4 0.7 - 3.1 x10E3/uL   Monocytes Absolute 0.6 0.1 - 0.9 x10E3/uL   EOS (ABSOLUTE) 0.5 (H) 0.0 - 0.4 x10E3/uL   Basophils Absolute 0.1 0.0 - 0.2 x10E3/uL   Immature Granulocytes 0 Not Estab. %   Immature Grans (Abs) 0.0 0.0 - 0.1 x10E3/uL    Allergies as of 08/06/2023       Reactions   Geodon [ziprasidone Hydrochloride] Other (See Comments)   Numbness ,sob, headaches, blurred vision   Sulfacetamide Sodium Hives   Ziprasidone Anaphylaxis   Other reaction(s): Other (See Comments) Numbness ,sob, headaches, blurred vision Other reaction(s): Other (See Comments), Unknown Numbness ,sob, headaches, blurred vision Numbness ,sob, headaches, blurred vision Other reaction(s): Other (See Comments), Unknown Numbness ,sob, headaches, blurred vision Numbness ,sob, headaches, blurred vision Numbness ,sob, headaches, blurred vision Other reaction(s): Other (See Comments), Unknown Numbness ,sob, headaches, blurred vision Numbness ,sob, headaches, blurred vision Other reaction(s): Other (See Comments), Unknown Numbness ,sob, headaches, blurred vision Numbness ,sob, headaches, blurred vision   Ziprasidone Hcl Anaphylaxis, Other (See Comments)   Other reaction(s): Other (See Comments), Unknown Numbness ,sob, headaches, blurred vision Numbness ,sob, headaches, blurred vision   Ace Inhibitors Rash   Erythromycin Rash, Hives   Hydromorphone Rash   Lamictal [lamotrigine] Rash   Other reaction(s): Unknown   Other Rash   rash   Strawberry (diagnostic) Rash   Sulfa Antibiotics Hives, Rash   Other reaction(s): Unknown Other reaction(s): Unknown Other reaction(s): Unknown Other reaction(s): Unknown        Medication List        Accurate as of August 06, 2023 11:59 PM. If you have any questions,  ask your nurse or doctor.          albuterol 108 (90 Base) MCG/ACT inhaler Commonly known as: VENTOLIN HFA Inhale 1 puff into the lungs every 6 (six) hours as needed for wheezing or shortness of breath.   ARIPiprazole 30 MG tablet Commonly known  as: ABILIFY Take 30 mg by mouth daily.   aspirin 81 MG chewable tablet Aspirin 81 MG Oral Tablet Chewable QTY: 30 tablet Days: 30 Refills: 0  Written: 01/23/21 Patient Instructions: once a day   Breztri Aerosphere 160-9-4.8 MCG/ACT Aero Generic drug: budeson-glycopyrrolate-formoterol Inhale 2 puffs into the lungs in the morning and at bedtime.   celecoxib 100 MG capsule Commonly known as: CELEBREX Take 100 mg by mouth 2 (two) times daily.   colestipol 1 g tablet Commonly known as: COLESTID TAKE 2 TABLETS(2 GRAMS) BY MOUTH TWICE DAILY   colestipol 1 g tablet Commonly known as: COLESTID TAKE 2 TABLETS(2 GRAMS) BY MOUTH TWICE DAILY   cyanocobalamin 500 MCG tablet Commonly known as: VITAMIN B12 Take 1 tablet (500 mcg total) by mouth daily.   dicyclomine 10 MG capsule Commonly known as: BENTYL TAKE 1 CAPSULE BY MOUTH EVERY 8 HOURS AS NEEDED FOR ABDOMINAL PAIN   DULoxetine 60 MG capsule Commonly known as: CYMBALTA Take 60 mg by mouth 2 (two) times daily.   empagliflozin 25 MG Tabs tablet Commonly known as: Jardiance Take 1 tablet (25 mg total) by mouth daily.   esomeprazole 40 MG capsule Commonly known as: NexIUM Take 1 capsule (40 mg total) by mouth 2 (two) times daily before a meal.   famotidine 20 MG tablet Commonly known as: PEPCID Take 1 tablet (20 mg total) by mouth 2 (two) times daily.   fexofenadine 180 MG tablet Commonly known as: ALLEGRA Take 1 tablet (180 mg total) by mouth daily.   fluticasone 50 MCG/ACT nasal spray Commonly known as: FLONASE Place 1 spray into both nostrils daily.   gabapentin 600 MG tablet Commonly known as: NEURONTIN Take 600 mg by mouth 2 (two) times daily.   hydrOXYzine 25 MG  tablet Commonly known as: ATARAX Take 50 mg by mouth every 8 (eight) hours as needed for itching.   Klor-Con M10 10 MEQ tablet Generic drug: potassium chloride Take 10 mEq by mouth daily.   potassium chloride SA 20 MEQ tablet Commonly known as: KLOR-CON M Take 1 tablet (20 mEq total) by mouth 2 (two) times daily.   levothyroxine 200 MCG tablet Commonly known as: SYNTHROID PLEASE SEE ATTACHED FOR DETAILED DIRECTIONS   levothyroxine 25 MCG tablet Commonly known as: SYNTHROID Take 1 tablet (25 mcg total) by mouth daily before breakfast. Along with 200mg  dose to equal daily in the AM   liothyronine 5 MCG tablet Commonly known as: CYTOMEL Take 10 mcg by mouth daily.   lisinopril 10 MG tablet Commonly known as: ZESTRIL Take 1 tablet (10 mg total) by mouth daily.   metFORMIN 500 MG tablet Commonly known as: GLUCOPHAGE Take 1 tablet (500 mg total) by mouth 2 (two) times daily with a meal.   methocarbamol 500 MG tablet Commonly known as: ROBAXIN Take 1 tablet (500 mg total) by mouth 3 (three) times daily.   metoprolol tartrate 50 MG tablet Commonly known as: LOPRESSOR Take 50 mg by mouth daily in the afternoon.   montelukast 10 MG tablet Commonly known as: SINGULAIR Take 10 mg by mouth daily.   nortriptyline 10 MG capsule Commonly known as: PAMELOR Take 10 mg in the morning   nystatin powder Commonly known as: nystatin Apply 1 Application topically 3 (three) times daily.   Repatha SureClick 140 MG/ML Soaj Generic drug: Evolocumab Inject into the skin.   rosuvastatin 40 MG tablet Commonly known as: CRESTOR Take 40 mg by mouth daily.   Rybelsus 3 MG Tabs Generic drug:  Semaglutide Take 1 tablet (3 mg total) by mouth daily.   spironolactone 25 MG tablet Commonly known as: ALDACTONE TAKE 1/2 (HALF) TABLET BY MOUTH ONCE DAILY.   Suboxone 8-2 MG Film Generic drug: Buprenorphine HCl-Naloxone HCl Dissolve 1 film under tongue twice daily   triamcinolone  cream 0.1 % Commonly known as: KENALOG Apply 1 Application topically 2 (two) times daily. Started by: Miki Kins   Vitamin D (Ergocalciferol) 1.25 MG (50000 UNIT) Caps capsule Commonly known as: DRISDOL 1 po q wed and 1 po q sun            Assessment & Plan:   Problem List Items Addressed This Visit       Digestive   Chronic diarrhea - Primary   Relevant Orders   Pancreatic Elastase, Fecal (Completed)   Ova and parasite examination (Completed)   Culture, Stool (Completed)   Celiac Disease Ab Screen w/Rfx (Completed)     Endocrine   Hypothyroidism   Relevant Orders   TSH+T4F+T3Free (Completed)   Other Visit Diagnoses       Atopic dermatitis of face         Atopic dermatitis, unspecified type            Return in about 3 weeks (around 08/27/2023) for F/U.  Total time spent: 20 minutes  Miki Kins, FNP  08/06/2023   This document may have been prepared by [facility] Voice Recognition software and as such may include unintentional dictation errors.

## 2023-10-07 ENCOUNTER — Encounter: Payer: Self-pay | Admitting: Family

## 2023-10-07 ENCOUNTER — Ambulatory Visit (INDEPENDENT_AMBULATORY_CARE_PROVIDER_SITE_OTHER): Admitting: Family

## 2023-10-07 VITALS — BP 116/68 | HR 73 | Ht 64.0 in | Wt 250.4 lb

## 2023-10-07 DIAGNOSIS — K449 Diaphragmatic hernia without obstruction or gangrene: Secondary | ICD-10-CM | POA: Insufficient documentation

## 2023-10-07 DIAGNOSIS — F3162 Bipolar disorder, current episode mixed, moderate: Secondary | ICD-10-CM

## 2023-10-07 DIAGNOSIS — Z6839 Body mass index (BMI) 39.0-39.9, adult: Secondary | ICD-10-CM

## 2023-10-07 DIAGNOSIS — I5030 Unspecified diastolic (congestive) heart failure: Secondary | ICD-10-CM

## 2023-10-07 DIAGNOSIS — R7303 Prediabetes: Secondary | ICD-10-CM

## 2023-10-07 DIAGNOSIS — Z0130 Encounter for examination of blood pressure without abnormal findings: Secondary | ICD-10-CM

## 2023-10-07 DIAGNOSIS — E66812 Obesity, class 2: Secondary | ICD-10-CM | POA: Diagnosis not present

## 2023-10-07 NOTE — Assessment & Plan Note (Signed)
 Continue current meds.  Will adjust as needed based on results.  The patient is asked to make an attempt to improve diet and exercise patterns to aid in medical management of this problem. Addressed importance of increasing and maintaining water intake.

## 2023-10-07 NOTE — Assessment & Plan Note (Signed)
 Sending referral to General Surgery for patient.  She previously saw Dr. Everlene Farrier, so we will send referral to him again.

## 2023-10-07 NOTE — Progress Notes (Signed)
 Established Patient Office Visit  Subjective:  Patient ID: Alexis Christensen, female    DOB: 08-21-1970  Age: 53 y.o. MRN: [id number]  Chief Complaint  Patient presents with   Follow-up    Patient is here today for her 3 months follow up.  She has been feeling fairly well since last appointment.   She does have additional concerns to discuss today.  Needs to get back in with the surgeon, hiatal hernia has increased in size, and the doctor who did the endoscopy said she needs to get seen again. They also told her that they thought the cough is coming from the acid reflux from the hernia.   Labs are due today. She needs refills.   I have reviewed her active problem list, medication list, allergies, health maintenance, notes from last encounter, lab results for her appointment today.      No other concerns at this time.   Past Medical History:  Diagnosis Date   Acute deep vein thrombosis (DVT) of right lower extremity (HCC) 2016   ADHD    Adopted    ANA positive    Anemia    Anxiety    Arthritis    Asthma    B12 deficiency    Bilateral hand pain    Bilateral swelling of feet and ankles    Bipolar 1 disorder (HCC)    Carpal tunnel syndrome of left wrist    Chest pain    Chronic diarrhea 09/07/2015   Chronic diastolic (congestive) heart failure (HCC)    Chronic left shoulder pain    Chronic venous insufficiency 04/26/2016   Clostridioides difficile infection 04/06/2019   Depression    Diverticulosis    Edema leg    GERD (gastroesophageal reflux disease)    Heart murmur    Helicobacter pylori (H. pylori) infection 08/24/2015   Hiatal hernia    History of 2019 novel coronavirus disease (COVID-19) 09/09/2020   a.) PCR testing (+): 09/09/2020, 01/12/2021, 07/17/2021   HLD (hyperlipidemia)    Hx MRSA infection    Hypothyroidism    Influenza A 10/01/2022   Knee effusion    Knee pain    Lipoma of arm 05/11/2013   Lower extremity edema    Lumbago    Lymphedema  04/26/2016   Migraine with aura    Multinodular goiter    Nephrolithiasis    Plantar fascial fibromatosis    Post traumatic stress disorder (PTSD)    Pre-diabetes    PVD (peripheral vascular disease) (HCC)    Seasonal allergies    Shortness of breath on exertion    Stress incontinence    Urge incontinence    Venous insufficiency    Venous ulcer (HCC)    Vitamin D deficiency     Past Surgical History:  Procedure Laterality Date   ABDOMINAL HYSTERECTOMY     ANKLE SURGERY     CHOLECYSTECTOMY     COLONOSCOPY WITH PROPOFOL N/A 06/24/2019   Procedure: COLONOSCOPY WITH PROPOFOL;  Surgeon: Toledo, Boykin Nearing, MD;  Location: [facility];  Service: Gastroenterology;  Laterality: N/A;   CYSTOSCOPY N/A 04/09/2016   Procedure: CYSTOSCOPY;  Surgeon: Alfredo Martinez, MD;  Location: [facility];  Service: Urology;  Laterality: N/A;   DILATION AND CURETTAGE OF UTERUS     ESOPHAGOGASTRODUODENOSCOPY (EGD) WITH PROPOFOL N/A 06/24/2019   Procedure: ESOPHAGOGASTRODUODENOSCOPY (EGD) WITH PROPOFOL;  Surgeon: Toledo, Boykin Nearing, MD;  Location: [facility];  Service: Gastroenterology;  Laterality: N/A;   ESOPHAGOGASTRODUODENOSCOPY (EGD) WITH PROPOFOL N/A 10/01/2023  Procedure: ESOPHAGOGASTRODUODENOSCOPY (EGD) WITH PROPOFOL;  Surgeon: Wyline Mood, MD;  Location: [facility];  Service: Gastroenterology;  Laterality: N/A;   INGUINAL HERNIA REPAIR Bilateral 04/18/2017   Procedure: LAPAROSCOPIC BILATERAL INGUINAL HERNIA REPAIR;  Surgeon: Leafy Ro, MD;  Location: [facility];  Service: General;  Laterality: Bilateral;   INTERSTIM IMPLANT PLACEMENT N/A 11/26/2016   Procedure: Leane Platt IMPLANT FIRST STAGE;  Surgeon: Alfredo Martinez, MD;  Location: [facility];  Service: Urology;  Laterality: N/A;   INTERSTIM IMPLANT PLACEMENT N/A 11/26/2016   Procedure: Leane Platt IMPLANT SECOND STAGE;  Surgeon: Alfredo Martinez, MD;  Location: [facility];  Service: Urology;  Laterality: N/A;   INTERSTIM IMPLANT PLACEMENT N/A  02/18/2023   Procedure: Leane Platt IMPLANT FIRST STAGE WITH IMPEDANCE CHECK;  Surgeon: Alfredo Martinez, MD;  Location: [facility];  Service: Urology;  Laterality: N/A;   INTERSTIM IMPLANT PLACEMENT N/A 02/18/2023   Procedure: Leane Platt IMPLANT SECOND STAGE;  Surgeon: Alfredo Martinez, MD;  Location: [facility];  Service: Urology;  Laterality: N/A;   INTERSTIM IMPLANT REMOVAL N/A 02/18/2023   Procedure: REMOVAL OF INTERSTIM IMPLANT;  Surgeon: Alfredo Martinez, MD;  Location: [facility];  Service: Urology;  Laterality: N/A;   JOINT REPLACEMENT Right    knee   KNEE ARTHROSCOPY Bilateral    KNEE ARTHROSCOPY WITH LATERAL RELEASE Left 10/18/2015   Procedure: KNEE ARTHROSCOPY LATERAL AND PARTIAL SYNOVECTOMY;  Surgeon: Kennedy Bucker, MD;  Location: [facility];  Service: Orthopedics;  Laterality: Left;   Lymph Node removal  2015   Neck   PUBOVAGINAL SLING N/A 04/09/2016   Procedure: PUBO-VAGINAL SLING/ RETROPUBIC SLING;  Surgeon: Alfredo Martinez, MD;  Location: [facility];  Service: Urology;  Laterality: N/A;   REPLACEMENT TOTAL KNEE Right    SHOULDER SURGERY Right 2014   TUBAL LIGATION     WRIST SURGERY Right    metal plate    Social History   Socioeconomic History   Marital status: Divorced    Spouse name: Not on file   Number of children: Not on file   Years of education: Not on file   Highest education level: Not on file  Occupational History   Occupation: Customer Service    Employer: [employer]  Tobacco Use   Smoking status: Former    Current packs/day: 0.00    Average packs/day: 0.5 packs/day for 11.0 years (5.5 ttl pk-yrs)    Types: Cigarettes    Start date: 07/16/2008    Quit date: 07/16/2019    Years since quitting: 4.2    Passive exposure: Past   Smokeless tobacco: Former   Tobacco comments:    STRESS RELATED  Vaping Use   Vaping status: Never Used  Substance and Sexual Activity   Alcohol use: Not Currently    Comment: Hisotry of ETOH abouse ASB patient   Drug use: Not Currently     Comment: Pt listed history of abuse of "prescription drugs"   Sexual activity: Not Currently  Other Topics Concern   Not on file  Social History Narrative   Not on file   Social Drivers of Health   Financial Resource Strain: Low Risk  (08/16/2023)   Received from [facility]   Overall Financial Resource Strain (CARDIA)    Difficulty of Paying Living Expenses: Not very hard  Food Insecurity: No Food Insecurity (08/16/2023)   Received from [facility]   Hunger Vital Sign    Worried About Running Out of Food in the Last Year: Never true    Ran Out of  Food in the Last Year: Never true  Transportation Needs: No Transportation Needs (08/16/2023)   Received [facility]    In the past 12 months, has lack of transportation kept you from medical appointments or from getting medications?: No    Lack of Transportation (Non-Medical): No  Physical Activity: Not on file  Stress: Not on file  Social Connections: Not on file  Intimate Partner Violence: Not on file    Family History  Adopted: Yes  Family history unknown: Yes    Allergies  Allergen Reactions   Geodon [Ziprasidone Hydrochloride] Other (See Comments)    Numbness ,sob, headaches, blurred vision   Sulfacetamide Sodium Hives   Ziprasidone Anaphylaxis    Other reaction(s): Other (See Comments) Numbness ,sob, headaches, blurred vision  Other reaction(s): Other (See Comments), Unknown Numbness ,sob, headaches, blurred vision Numbness ,sob, headaches, blurred vision  Other reaction(s): Other (See Comments), Unknown Numbness ,sob, headaches, blurred vision Numbness ,sob, headaches, blurred vision   Numbness ,sob, headaches, blurred vision Other reaction(s): Other (See Comments), Unknown Numbness ,sob, headaches, blurred vision Numbness ,sob, headaches, blurred vision Other reaction(s): Other (See Comments), Unknown Numbness ,sob, headaches,  blurred vision Numbness ,sob, headaches, blurred vision   Ziprasidone Hcl Anaphylaxis and Other (See Comments)    Other reaction(s): Other (See Comments), Unknown Numbness ,sob, headaches, blurred vision Numbness ,sob, headaches, blurred vision   Ace Inhibitors Rash   Erythromycin Rash and Hives   Hydromorphone Rash   Lamictal [Lamotrigine] Rash    Other reaction(s): Unknown   Other Rash    rash   Strawberry (Diagnostic) Rash   Sulfa Antibiotics Hives and Rash    Other reaction(s): Unknown  Other reaction(s): Unknown Other reaction(s): Unknown Other reaction(s): Unknown    Review of Systems  Gastrointestinal:  Positive for heartburn.  All other systems reviewed and are negative.      Objective:   BP 116/68   Pulse 73   Ht 5\' 4"  (1.626 m)   Wt 250 lb 6.4 oz (113.6 kg)   SpO2 96%   BMI 42.98 kg/m   Vitals:   10/07/23 0916  BP: 116/68  Pulse: 73  Height: 5\' 4"  (1.626 m)  Weight: 250 lb 6.4 oz (113.6 kg)  SpO2: 96%  BMI (Calculated): 42.96    Physical Exam Vitals and nursing note reviewed.  Constitutional:      Appearance: Normal appearance. She is normal weight.  HENT:     Head: Normocephalic.  Eyes:     Extraocular Movements: Extraocular movements intact.     Conjunctiva/sclera: Conjunctivae normal.     Pupils: Pupils are equal, round, and reactive to light.  Cardiovascular:     Rate and Rhythm: Normal rate.  Pulmonary:     Effort: Pulmonary effort is normal.  Neurological:     General: No focal deficit present.     Mental Status: She is alert and oriented to person, place, and time. Mental status is at baseline.  Psychiatric:        Mood and Affect: Mood normal.        Behavior: Behavior normal.        Thought Content: Thought content normal.      No results found for any visits on 10/07/23.  Recent Results (from the past 2160 hours)  Celiac Disease Ab Screen w/Rfx     Status: None   Collection Time: 08/06/23 11:33 AM  Result Value Ref Range    Antigliadin Abs, IgA  6 0 - 19 units    Comment:                    Negative                   0 - 19                    Weak Positive             20 - 30                    Moderate to Strong Positive   >30    Transglutaminase IgA <2 0 - 3 U/mL    Comment:                               Negative        0 -  3                               Weak Positive   4 - 10                               Positive           >10  Tissue Transglutaminase (tTG) has been identified  as the endomysial antigen.  Studies have demonstr-  ated that endomysial IgA antibodies have over 99%  specificity for gluten sensitive enteropathy.    IgA/Immunoglobulin A, Serum 258 87 - 352 mg/dL  [id number]     Status: Abnormal   Collection Time: 08/06/23 11:33 AM  Result Value Ref Range   TSH 10.300 (H) 0.450 - 4.500 uIU/mL   T3, Free 2.9 2.0 - 4.4 pg/mL   Free T4 0.70 (L) 0.82 - 1.77 ng/dL  Pancreatic Elastase, Fecal     Status: None   Collection Time: 08/09/23  8:00 AM  Result Value Ref Range   Pancreatic Elastase, Fecal >800 >200 ug Elast./g    Comment:        Severe Pancreatic Insufficiency:          <100        Moderate Pancreatic Insufficiency:   100 - 200        Normal:                                   >200   Culture, Stool     Status: None   Collection Time: 08/09/23  8:00 AM   Specimen: Stool   ST  Result Value Ref Range   Salmonella/Shigella Screen Final report    Stool Culture result 1 (RSASHR) Comment     Comment: No Salmonella or Shigella recovered.   Campylobacter Culture Final report    Stool Culture result 1 (CMPCXR) Comment     Comment: No Campylobacter species isolated.   E coli, Shiga toxin Assay Negative Negative  Ova and parasite examination     Status: None   Collection Time: 08/09/23 11:20 AM   Specimen: Stool   ST  Result Value Ref Range   OVA + PARASITE EXAM Final report     Comment: These results were obtained using wet preparation(s) and trichrome stained smear. This  test does not include testing  for Cryptosporidium parvum, Cyclospora, or Microsporidia.    O&P result 1 Comment     Comment: No ova, cysts, or parasites seen. One negative specimen does not rule out the possibility of a parasitic infection.   Hemoglobin A1c     Status: Abnormal   Collection Time: 09/27/23  9:39 AM  Result Value Ref Range   Hgb A1c MFr Bld 6.2 (H) 4.8 - 5.6 %    Comment:          Prediabetes: 5.7 - 6.4          Diabetes: >6.4          Glycemic control for adults with diabetes: <7.0    Est. average glucose Bld gHb Est-mCnc 131 mg/dL  TSH     Status: Abnormal   Collection Time: 09/27/23  9:39 AM  Result Value Ref Range   TSH 10.300 (H) 0.450 - 4.500 uIU/mL  CMP14+EGFR     Status: Abnormal   Collection Time: 09/27/23  9:39 AM  Result Value Ref Range   Glucose 106 (H) 70 - 99 mg/dL   BUN 12 6 - 24 mg/dL   Creatinine, Ser 1.61 0.57 - 1.00 mg/dL   eGFR 96 >09 UE/AVW/0.98   BUN/Creatinine Ratio 16 9 - 23   Sodium 143 134 - 144 mmol/L   Potassium 4.0 3.5 - 5.2 mmol/L   Chloride 104 96 - 106 mmol/L   CO2 28 20 - 29 mmol/L   Calcium 8.7 8.7 - 10.2 mg/dL   Total Protein 6.4 6.0 - 8.5 g/dL   Albumin 3.9 3.8 - 4.9 g/dL   Globulin, Total 2.5 1.5 - 4.5 g/dL   Bilirubin Total 0.3 0.0 - 1.2 mg/dL   Alkaline Phosphatase 109 44 - 121 IU/L   AST 19 0 - 40 IU/L   ALT 14 0 - 32 IU/L  Lipid panel     Status: Abnormal   Collection Time: 09/27/23  9:39 AM  Result Value Ref Range   Cholesterol, Total 188 100 - 199 mg/dL   Triglycerides 88 0 - 149 mg/dL   HDL 56 >11 mg/dL   VLDL Cholesterol Cal 16 5 - 40 mg/dL   LDL Chol Calc (NIH) 914 (H) 0 - 99 mg/dL   Chol/HDL Ratio 3.4 0.0 - 4.4 ratio    Comment:                                   T. Chol/HDL Ratio                                             Men  Women                               1/2 Avg.Risk  3.4    3.3                                   Avg.Risk  5.0    4.4                                2X Avg.Risk  9.6    7.1  3X Avg.Risk 23.4   11.0   CBC With Diff/Platelet     Status: Abnormal   Collection Time: 09/27/23  9:39 AM  Result Value Ref Range   WBC 6.7 3.4 - 10.8 x10E3/uL   RBC 3.93 3.77 - 5.28 x10E6/uL   Hemoglobin 11.1 11.1 - 15.9 g/dL   Hematocrit 64.3 32.9 - 46.6 %   MCV 89 79 - 97 fL   MCH 28.2 26.6 - 33.0 pg   MCHC 31.6 31.5 - 35.7 g/dL   RDW 51.8 84.1 - 66.0 %   Platelets 300 150 - 450 x10E3/uL   Neutrophils 61 Not Estab. %   Lymphs 21 Not Estab. %   Monocytes 9 Not Estab. %   Eos 8 Not Estab. %   Basos 1 Not Estab. %   Neutrophils Absolute 4.1 1.4 - 7.0 x10E3/uL   Lymphocytes Absolute 1.4 0.7 - 3.1 x10E3/uL   Monocytes Absolute 0.6 0.1 - 0.9 x10E3/uL   EOS (ABSOLUTE) 0.5 (H) 0.0 - 0.4 x10E3/uL   Basophils Absolute 0.1 0.0 - 0.2 x10E3/uL   Immature Granulocytes 0 Not Estab. %   Immature Grans (Abs) 0.0 0.0 - 0.1 x10E3/uL       Assessment & Plan:   Problem List Items Addressed This Visit       Cardiovascular and Mediastinum   Diastolic congestive heart failure (HCC)   Patient is seen by Cardiology, who manage this condition.  She is well controlled with current therapy.   Will defer to them for further changes to plan of care.         Respiratory   Hiatal hernia - Primary   Sending referral to General Surgery for patient.  She previously saw Dr. Everlene Farrier, so we will send referral to him again.       Relevant Orders   Ambulatory referral to General Surgery     Other   Bipolar mixed affective disorder, moderate (HCC)   Patient stable.  Well controlled with current therapy.   Continue current meds.        Class 2 severe obesity with serious comorbidity and body mass index (BMI) of 39.0 to 39.9 in adult, unspecified obesity type (HCC)   Continue current meds.  Will adjust as needed based on results.  The patient is asked to make an attempt to improve diet and exercise patterns to aid in medical management of this problem. Addressed  importance of increasing and maintaining water intake.         Return in about 3 months (around 01/07/2024).   Total time spent: 20 minutes  Miki Kins, FNP  10/07/2023   This document may have been prepared by [facility] Voice Recognition software and as such may include unintentional dictation errors.

## 2023-10-07 NOTE — Assessment & Plan Note (Signed)
 Patient stable.  Well controlled with current therapy.   Continue current meds.

## 2023-10-07 NOTE — Assessment & Plan Note (Signed)
 Patient is seen by Cardiology, who manage this condition.  She is well controlled with current therapy.   Will defer to them for further changes to plan of care.

## 2023-10-14 ENCOUNTER — Ambulatory Visit: Admitting: Cardiology

## 2023-10-14 DIAGNOSIS — S92514A Nondisplaced fracture of proximal phalanx of right lesser toe(s), initial encounter for closed fracture: Secondary | ICD-10-CM | POA: Diagnosis not present

## 2023-10-16 DIAGNOSIS — G8929 Other chronic pain: Secondary | ICD-10-CM | POA: Diagnosis not present

## 2023-10-23 ENCOUNTER — Ambulatory Visit: Admitting: Surgery

## 2023-10-23 ENCOUNTER — Encounter: Payer: Self-pay | Admitting: Surgery

## 2023-10-23 ENCOUNTER — Telehealth: Payer: Self-pay | Admitting: Family

## 2023-10-23 ENCOUNTER — Other Ambulatory Visit: Payer: Self-pay

## 2023-10-23 VITALS — BP 110/74 | HR 69 | Temp 98.0°F | Ht 64.0 in | Wt 250.8 lb

## 2023-10-23 DIAGNOSIS — K449 Diaphragmatic hernia without obstruction or gangrene: Secondary | ICD-10-CM | POA: Diagnosis not present

## 2023-10-23 MED ORDER — SUCRALFATE 1 G PO TABS: 1.0000 g | ORAL_TABLET | Freq: Three times a day (TID) | ORAL | 0 refills | Status: DC | PRN

## 2023-10-23 NOTE — Telephone Encounter (Signed)
 Patient left VM wanting an Rx for [facility]. The Zepbound was not covered. She was told the Greggory Keen was the only thing that will get approved.

## 2023-10-23 NOTE — Progress Notes (Unsigned)
 Outpatient Surgical Follow Up  10/23/2023  Alexis Christensen is an 53 y.o. female.   Chief Complaint  Patient presents with   New Patient (Initial Visit)    Hiatal hernia    HPI: Alexis Christensen is a 53 y.o. female following for hiatal hernia.  Endorses some dysphagia .  Sometimes the seems to be related to solid and sometimes seems to be related to liquids.  She also endorses regards to her heartburn and reflux for the last several months that only partially relieved with medications.  She Does have chronic GI issues to include irritable bowel syndrome and diarrhea. She does have bipolar disorder and is on medication for that.  She does have also diastolic heart failure but is very functional and is able to perform more than 4 METS of activity without shortness of breath or chest pain. Did have a recent barium swallow that I have personally reviewed showing evidence of a moderate hiatal hernia with reflux.  No evidence of esophageal motility disorder. CBC and BMP was completely normal SHe stated that she quit smoking 4 years ago.  Past Medical History:  Diagnosis Date   Acute deep vein thrombosis (DVT) of right lower extremity (HCC) 2016   ADHD    Adopted    ANA positive    Anemia    Anxiety    Arthritis    Asthma    B12 deficiency    Bilateral hand pain    Bilateral swelling of feet and ankles    Bipolar 1 disorder (HCC)    Carpal tunnel syndrome of left wrist    Chest pain    Chronic diarrhea 09/07/2015   Chronic diastolic (congestive) heart failure (HCC)    Chronic left shoulder pain    Chronic venous insufficiency 04/26/2016   Clostridioides difficile infection 04/06/2019   Depression    Diverticulosis    Edema leg    GERD (gastroesophageal reflux disease)    Heart murmur    Helicobacter pylori (H. pylori) infection 08/24/2015   Hiatal hernia    History of 2019 novel coronavirus disease (COVID-19) 09/09/2020   a.) PCR testing (+): 09/09/2020, 01/12/2021, 07/17/2021   HLD  (hyperlipidemia)    Hx MRSA infection    Hypothyroidism    Influenza A 10/01/2022   Knee effusion    Knee pain    Lipoma of arm 05/11/2013   Lower extremity edema    Lumbago    Lymphedema 04/26/2016   Migraine with aura    Multinodular goiter    Nephrolithiasis    Plantar fascial fibromatosis    Post traumatic stress disorder (PTSD)    Pre-diabetes    PVD (peripheral vascular disease) (HCC)    Seasonal allergies    Shortness of breath on exertion    Stress incontinence    Urge incontinence    Venous insufficiency    Venous ulcer (HCC)    Vitamin D deficiency     Past Surgical History:  Procedure Laterality Date   ABDOMINAL HYSTERECTOMY     ANKLE SURGERY     CHOLECYSTECTOMY     COLONOSCOPY WITH PROPOFOL N/A 06/24/2019   Procedure: COLONOSCOPY WITH PROPOFOL;  Surgeon: Toledo, Boykin Nearing, MD;  Location: [facility];  Service: Gastroenterology;  Laterality: N/A;   CYSTOSCOPY N/A 04/09/2016   Procedure: CYSTOSCOPY;  Surgeon: Alfredo Martinez, MD;  Location: [facility];  Service: Urology;  Laterality: N/A;   DILATION AND CURETTAGE OF UTERUS     ESOPHAGOGASTRODUODENOSCOPY (EGD) WITH PROPOFOL N/A 06/24/2019  Procedure: ESOPHAGOGASTRODUODENOSCOPY (EGD) WITH PROPOFOL;  Surgeon: Toledo, Boykin Nearing, MD;  Location: [facility];  Service: Gastroenterology;  Laterality: N/A;   ESOPHAGOGASTRODUODENOSCOPY (EGD) WITH PROPOFOL N/A 10/01/2023   Procedure: ESOPHAGOGASTRODUODENOSCOPY (EGD) WITH PROPOFOL;  Surgeon: Wyline Mood, MD;  Location: [facility];  Service: Gastroenterology;  Laterality: N/A;   INGUINAL HERNIA REPAIR Bilateral 04/18/2017   Procedure: LAPAROSCOPIC BILATERAL INGUINAL HERNIA REPAIR;  Surgeon: Leafy Ro, MD;  Location: [facility];  Service: General;  Laterality: Bilateral;   INTERSTIM IMPLANT PLACEMENT N/A 11/26/2016   Procedure: Leane Platt IMPLANT FIRST STAGE;  Surgeon: Alfredo Martinez, MD;  Location: [facility];  Service: Urology;  Laterality: N/A;   INTERSTIM IMPLANT  PLACEMENT N/A 11/26/2016   Procedure: Leane Platt IMPLANT SECOND STAGE;  Surgeon: Alfredo Martinez, MD;  Location: [facility];  Service: Urology;  Laterality: N/A;   INTERSTIM IMPLANT PLACEMENT N/A 02/18/2023   Procedure: Leane Platt IMPLANT FIRST STAGE WITH IMPEDANCE CHECK;  Surgeon: Alfredo Martinez, MD;  Location: [facility];  Service: Urology;  Laterality: N/A;   INTERSTIM IMPLANT PLACEMENT N/A 02/18/2023   Procedure: Leane Platt IMPLANT SECOND STAGE;  Surgeon: Alfredo Martinez, MD;  Location: [facility];  Service: Urology;  Laterality: N/A;   INTERSTIM IMPLANT REMOVAL N/A 02/18/2023   Procedure: REMOVAL OF INTERSTIM IMPLANT;  Surgeon: Alfredo Martinez, MD;  Location: [facility];  Service: Urology;  Laterality: N/A;   JOINT REPLACEMENT Right    knee   KNEE ARTHROSCOPY Bilateral    KNEE ARTHROSCOPY WITH LATERAL RELEASE Left 10/18/2015   Procedure: KNEE ARTHROSCOPY LATERAL AND PARTIAL SYNOVECTOMY;  Surgeon: Kennedy Bucker, MD;  Location: [facility];  Service: Orthopedics;  Laterality: Left;   Lymph Node removal  2015   Neck   PUBOVAGINAL SLING N/A 04/09/2016   Procedure: PUBO-VAGINAL SLING/ RETROPUBIC SLING;  Surgeon: Alfredo Martinez, MD;  Location: [facility];  Service: Urology;  Laterality: N/A;   REPLACEMENT TOTAL KNEE Right    SHOULDER SURGERY Right 2014   TUBAL LIGATION     WRIST SURGERY Right    metal plate    Family History  Adopted: Yes  Family history unknown: Yes    Social History:  reports that she quit smoking about 4 years ago. Her smoking use included cigarettes. She started smoking about 15 years ago. She has a 5.5 pack-year smoking history. She has been exposed to tobacco smoke. She has quit using smokeless tobacco. She reports that she does not currently use alcohol. She reports that she does not currently use drugs.  Allergies:  Allergies  Allergen Reactions   Geodon [Ziprasidone Hydrochloride] Other (See Comments)    Numbness ,sob, headaches, blurred vision   Sulfacetamide Sodium  Hives   Ziprasidone Anaphylaxis    Other reaction(s): Other (See Comments) Numbness ,sob, headaches, blurred vision  Other reaction(s): Other (See Comments), Unknown Numbness ,sob, headaches, blurred vision Numbness ,sob, headaches, blurred vision  Other reaction(s): Other (See Comments), Unknown Numbness ,sob, headaches, blurred vision Numbness ,sob, headaches, blurred vision   Numbness ,sob, headaches, blurred vision Other reaction(s): Other (See Comments), Unknown Numbness ,sob, headaches, blurred vision Numbness ,sob, headaches, blurred vision Other reaction(s): Other (See Comments), Unknown Numbness ,sob, headaches, blurred vision Numbness ,sob, headaches, blurred vision   Ziprasidone Hcl Anaphylaxis and Other (See Comments)    Other reaction(s): Other (See Comments), Unknown Numbness ,sob, headaches, blurred vision Numbness ,sob, headaches, blurred vision   Ace Inhibitors Rash   Erythromycin Rash and Hives   Hydromorphone Rash   Lamictal [Lamotrigine] Rash    Other reaction(s): Unknown   Other  Rash    rash   Strawberry (Diagnostic) Rash   Sulfa Antibiotics Hives and Rash    Other reaction(s): Unknown  Other reaction(s): Unknown Other reaction(s): Unknown Other reaction(s): Unknown    Medications reviewed.    ROS Full ROS performed and is otherwise negative other than what is stated in HPI   There were no vitals taken for this visit.  Physical Exam  CONSTITUTIONAL: NAD. EYES: Pupils are equal, round, Sclera are non-icteric. EARS, NOSE, MOUTH AND THROAT:  The oral mucosa is pink and moist. Hearing is intact to voice. LYMPH NODES:  Lymph nodes in the neck are normal. RESPIRATORY:  Lungs are clear. There is normal respiratory effort, with equal breath sounds bilaterally, and without pathologic use of accessory muscles. CARDIOVASCULAR: Heart is regular without murmurs, gallops, or rubs. GI: The abdomen is  soft, mild tenderness to palpation in the epigastric  area.  No evidence of guarding or peritonitis, There are no palpable masses. There is no hepatosplenomegaly. There are normal bowel sounds . GU: Rectal deferred.   MUSCULOSKELETAL: Normal muscle strength and tone. No cyanosis or edema.   SKIN: Turgor is good and there are no pathologic skin lesions or ulcers. NEUROLOGIC: Motor and sensation is grossly normal. Cranial nerves are grossly intact. PSYCH:  Oriented to person, place and time. Affect is normal.     Assessment/Plan: 48-year-old female with a moderate size hiatal hernia that is symptomatic and is causing reflux and dysphagia. She does have a BMI of 40 which makes her not ideal candidate for a paraesophageal hernia repair. Discussed with her in detail about improvement in weight. I do think that at some point in time she will benefit from robotic paraesophageal hernia repair and a partial fundoplication. Were not quite ready yet. She wishes to pursue further workup and she will need an EGD as well as a CT scan of the abdomen pelvis to define intra-abdominal anatomy. Discussed with her in detail regarding robotic surgery and robotic repair of paraesophageal hernia what it will entail and diet restrictions during the first month of the operation. She seems to be in agreement. Please note that I spent 60 minutes in this encounter including personally reviewing imaging studies, coordinating her care, placing orders and performing appropriate documentation   Sterling Big, MD [facility] General Surgeon

## 2023-10-23 NOTE — Patient Instructions (Addendum)
 Follow up in 6 months.  Please pick up prescription from your pharmacy.  Hiatal Hernia  A hiatal hernia occurs when part of the stomach slides above the muscle that separates the abdomen from the chest (diaphragm). A person can be born with a hiatal hernia (congenital), or it may develop over time. In almost all cases of hiatal hernia, only the top part of the stomach pushes through the diaphragm. Many people have a hiatal hernia with no symptoms. The larger the hernia, the more likely it is that you will have symptoms. In some cases, a hiatal hernia allows stomach acid to flow back into the tube that carries food from your mouth to your stomach (esophagus). This may cause heartburn symptoms. The development of heartburn symptoms may mean that you have a condition called gastroesophageal reflux disease (GERD). What are the causes? This condition is caused by a weakness in the opening (hiatus) where the esophagus passes through the diaphragm to attach to the upper part of the stomach. A person may be born with a weakness in the hiatus, or a weakness can develop over time. What increases the risk? This condition is more likely to develop in: Older people. Age is a major risk factor for a hiatal hernia, especially if you are over the age of 66. Pregnant women. People who are overweight. People who have frequent constipation. What are the signs or symptoms? Symptoms of this condition usually develop in the form of GERD symptoms. Symptoms include: Heartburn. Upset stomach (indigestion). Trouble swallowing. Coughing or wheezing. Wheezing is making high-pitched whistling sounds when you breathe. Sore throat. Chest pain. Nausea and vomiting. How is this diagnosed? This condition may be diagnosed during testing for GERD. Tests that may be done include: X-rays of your stomach or chest. An upper gastrointestinal (GI) series. This is an X-ray exam of your GI tract that is taken after you swallow a  chalky liquid that shows up clearly on the X-ray. Endoscopy. This is a procedure to look into your stomach using a thin, flexible tube that has a tiny camera and light on the end of it. How is this treated? This condition may be treated by: Dietary and lifestyle changes to help reduce GERD symptoms. Medicines. These may include: Over-the-counter antacids. Medicines that make your stomach empty more quickly. Medicines that block the production of stomach acid (H2 blockers). Stronger medicines to reduce stomach acid (proton pump inhibitors). Surgery to repair the hernia, if other treatments are not helping. If you have no symptoms, you may not need treatment. Follow these instructions at home: Lifestyle and activity Do not use any products that contain nicotine or tobacco. These products include cigarettes, chewing tobacco, and vaping devices, such as e-cigarettes. If you need help quitting, ask your health care provider. Try to achieve and maintain a healthy body weight. Avoid putting pressure on your abdomen. Anything that puts pressure on your abdomen increases the amount of acid that may be pushed up into your esophagus. Avoid bending over, especially after eating. Raise the head of your bed by putting blocks under the legs. This keeps your head and esophagus higher than your stomach. Do not wear tight clothing around your chest or stomach. Try not to strain when having a bowel movement, when urinating, or when lifting heavy objects. Eating and drinking Avoid foods that can worsen GERD symptoms. These may include: Fatty foods, like fried foods. Citrus fruits, like oranges or lemon. Other foods and drinks that contain acid, like orange juice or  tomatoes. Spicy food. Chocolate. Eat frequent small meals instead of three large meals a day. This helps prevent your stomach from getting too full. Eat slowly. Do not lie down right after eating. Do not eat 1-2 hours before bed. Do not drink  beverages with caffeine. These include cola, coffee, cocoa, and tea. Do not drink alcohol. General instructions Take over-the-counter and prescription medicines only as told by your health care provider. Keep all follow-up visits. Your health care provider will want to check that any new prescribed medicines are helping your symptoms. Contact a health care provider if: Your symptoms are not controlled with medicines or lifestyle changes. You are having trouble swallowing. You have coughing or wheezing that will not go away. Your pain is getting worse. Your pain spreads to your arms, neck, jaw, teeth, or back. You feel nauseous or you vomit. Get help right away if: You have shortness of breath. You vomit blood. You have bright red blood in your stools. You have black, tarry stools. These symptoms may be an emergency. Get help right away. Call 911. Do not wait to see if the symptoms will go away. Do not drive yourself to the hospital. Summary A hiatal hernia occurs when part of the stomach slides above the muscle that separates the abdomen from the chest. A person may be born with a weakness in the hiatus, or a weakness can develop over time. Symptoms of a hiatal hernia may include heartburn, trouble swallowing, or sore throat. Management of a hiatal hernia includes eating frequent small meals instead of three large meals a day. Get help right away if you vomit blood, have bright red blood in your stools, or have black, tarry stools. This information is not intended to replace advice given to you by your health care provider. Make sure you discuss any questions you have with your health care provider. Document Revised: 08/29/2021 Document Reviewed: 08/29/2021 Elsevier Patient Education  2024 [employer].

## 2023-10-24 ENCOUNTER — Other Ambulatory Visit: Payer: Self-pay

## 2023-10-24 MED ORDER — MOUNJARO 2.5 MG/0.5ML ~~LOC~~ SOAJ: 2.5000 mg | SUBCUTANEOUS | 1 refills | Status: AC

## 2023-10-27 ENCOUNTER — Encounter: Payer: Self-pay | Admitting: Family

## 2023-10-27 NOTE — Progress Notes (Signed)
 Established Patient Office Visit  Subjective:  Patient ID: Alexis Christensen, female    DOB: 05-May-1971  Age: 53 y.o. MRN: [id number]  Chief Complaint  Patient presents with   Follow-up    3 week follow up    Patient is here today for her 3 week follow up.  She has been feeling fairly well since last appointment.   She does not have additional concerns to discuss today.  Labs are not due today. She needs refills.   I have reviewed her active problem list, medication list, allergies, notes from last encounter, lab results for her appointment today.      No other concerns at this time.   Past Medical History:  Diagnosis Date   Acute cough 02/12/2023   Acute deep vein thrombosis (DVT) of right lower extremity (HCC) 2016   Acute non-recurrent maxillary sinusitis 07/16/2023   ADHD    Adopted    ANA positive    Anemia    Anxiety    Arthritis    Asthma    B12 deficiency    Bilateral hand pain    Bilateral swelling of feet and ankles    Bipolar 1 disorder (HCC)    Carpal tunnel syndrome of left wrist    Chest pain    Chronic diarrhea 09/07/2015   Chronic diastolic (congestive) heart failure (HCC)    Chronic left shoulder pain    Chronic venous insufficiency 04/26/2016   Clostridioides difficile infection 04/06/2019   Deep venous thrombosis of lower extremity (HCC) 03/04/2015   Depression    Diverticulosis    Edema leg    Effusion of knee 12/30/2013   GERD (gastroesophageal reflux disease)    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 pylori) infection 08/24/2015   Hiatal hernia    History of 2019 novel coronavirus disease (COVID-19) 09/09/2020   a.) PCR testing (+): 09/09/2020, 01/12/2021, 07/17/2021   HLD (hyperlipidemia)    Hx MRSA infection    Hypothyroidism    Influenza A 10/01/2022   Knee effusion    Knee pain    Left wrist sprain, sequela 01/14/2023   Lipoma of arm 05/11/2013   Lower extremity edema    Lumbago     Lymphedema 04/26/2016   Migraine with aura    Multinodular goiter    Nephrolithiasis    Plantar fascial fibromatosis    Post traumatic stress disorder (PTSD)    Pre-diabetes    PVD (peripheral vascular disease) (HCC)    Redness of eye, left 08/22/2016   Overview:   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  Seen at [facility]   Mild pre septal cellulitis /CT orbit FEB, 2018     Seasonal allergies    Shortness of breath on exertion    Sore throat 02/12/2023   Stress incontinence    Swelling of limb 04/26/2016   Urge incontinence    Urinary tract infection without hematuria 11/26/2022   Venous insufficiency    Venous ulcer (HCC)    Vitamin D deficiency    Weight loss, unintentional 09/07/2015   Wound infection, posttraumatic 01/14/2023    Past Surgical History:  Procedure Laterality Date   ABDOMINAL HYSTERECTOMY     ANKLE SURGERY     CHOLECYSTECTOMY     COLONOSCOPY WITH PROPOFOL N/A 06/24/2019   Procedure: COLONOSCOPY WITH PROPOFOL;  Surgeon: Toledo, Alphonsus Jeans, MD;  Location: [facility];  Service: Gastroenterology;  Laterality: N/A;  CYSTOSCOPY N/A 04/09/2016   Procedure: CYSTOSCOPY;  Surgeon: Erman Hayward, MD;  Location: [facility];  Service: Urology;  Laterality: N/A;   DILATION AND CURETTAGE OF UTERUS     ESOPHAGOGASTRODUODENOSCOPY (EGD) WITH PROPOFOL N/A 06/24/2019   Procedure: ESOPHAGOGASTRODUODENOSCOPY (EGD) WITH PROPOFOL;  Surgeon: Toledo, Alphonsus Jeans, MD;  Location: [facility];  Service: Gastroenterology;  Laterality: N/A;   ESOPHAGOGASTRODUODENOSCOPY (EGD) WITH PROPOFOL N/A 10/01/2023   Procedure: ESOPHAGOGASTRODUODENOSCOPY (EGD) WITH PROPOFOL;  Surgeon: Luke Salaam, MD;  Location: [facility];  Service: Gastroenterology;  Laterality: N/A;   INGUINAL HERNIA REPAIR Bilateral 04/18/2017   Procedure: LAPAROSCOPIC BILATERAL INGUINAL HERNIA REPAIR;  Surgeon: Alben Alma, MD;  Location: [facility];  Service: General;  Laterality: Bilateral;    INTERSTIM IMPLANT PLACEMENT N/A 11/26/2016   Procedure: Simona [place] IMPLANT FIRST STAGE;  Surgeon: Erman Hayward, MD;  Location: [facility];  Service: Urology;  Laterality: N/A;   INTERSTIM IMPLANT PLACEMENT N/A 11/26/2016   Procedure: Simona [place] IMPLANT SECOND STAGE;  Surgeon: Erman Hayward, MD;  Location: [facility];  Service: Urology;  Laterality: N/A;   INTERSTIM IMPLANT PLACEMENT N/A 02/18/2023   Procedure: Simona [place] IMPLANT FIRST STAGE WITH IMPEDANCE CHECK;  Surgeon: Erman Hayward, MD;  Location: [facility];  Service: Urology;  Laterality: N/A;   INTERSTIM IMPLANT PLACEMENT N/A 02/18/2023   Procedure: Simona [place] IMPLANT SECOND STAGE;  Surgeon: Erman Hayward, MD;  Location: [facility];  Service: Urology;  Laterality: N/A;   INTERSTIM IMPLANT REMOVAL N/A 02/18/2023   Procedure: REMOVAL OF INTERSTIM IMPLANT;  Surgeon: Erman Hayward, MD;  Location: [facility];  Service: Urology;  Laterality: N/A;   JOINT REPLACEMENT Right    knee   KNEE ARTHROSCOPY Bilateral    KNEE ARTHROSCOPY WITH LATERAL RELEASE Left 10/18/2015   Procedure: KNEE ARTHROSCOPY LATERAL AND PARTIAL SYNOVECTOMY;  Surgeon: Molli Angelucci, MD;  Location: [facility];  Service: Orthopedics;  Laterality: Left;   Lymph Node removal  2015   Neck   PUBOVAGINAL SLING N/A 04/09/2016   Procedure: PUBO-VAGINAL SLING/ RETROPUBIC SLING;  Surgeon: Erman Hayward, MD;  Location: [facility];  Service: Urology;  Laterality: N/A;   REPLACEMENT TOTAL KNEE Right    SHOULDER SURGERY Right 2014   TUBAL LIGATION     WRIST SURGERY Right    metal plate    Social History   Socioeconomic History   Marital status: Divorced    Spouse name: Not on file   Number of children: Not on file   Years of education: Not on file   Highest education level: Not on file  Occupational History   Occupation: Customer Service    Employer: [employer]  Tobacco Use   Smoking status: Former    Current packs/day: 0.00    Average packs/day: 0.5 packs/day for 11.0 years  (5.5 ttl pk-yrs)    Types: Cigarettes    Start date: 07/16/2008    Quit date: 07/16/2019    Years since quitting: 4.2    Passive exposure: Past   Smokeless tobacco: Former   Tobacco comments:    STRESS RELATED  Vaping Use   Vaping status: Never Used  Substance and Sexual Activity   Alcohol use: Not Currently    Comment: Hisotry of ETOH abouse ASB patient   Drug use: Not Currently    Comment: Pt listed history of abuse of "prescription drugs"   Sexual activity: Not Currently  Other Topics Concern   Not on file  Social History Narrative   Not on file   Social Drivers of Corporate investment banker  Strain: Low Risk  (08/16/2023)   Received from [facility]   Overall Financial Resource Strain (CARDIA)    Difficulty of Paying Living Expenses: Not very hard  Food Insecurity: No Food Insecurity (08/16/2023)   Received from [facility]   Hunger Vital Sign    Worried About Running Out of Food in the Last Year: Never true    Ran Out of Food in the Last Year: Never true  Transportation Needs: No Transportation Needs (08/16/2023)   [facility] - Transportation    In the past 12 months, has lack of transportation kept you from medical appointments or from getting medications?: No    Lack of Transportation (Non-Medical): No  Physical Activity: Not on file  Stress: Not on file  Social Connections: Not on file  Intimate Partner Violence: Not on file    Family History  Adopted: Yes  Family history unknown: Yes    Allergies  Allergen Reactions   Geodon [Ziprasidone Hydrochloride] Other (See Comments)    Numbness ,sob, headaches, blurred vision   Sulfacetamide Sodium Hives   Ziprasidone Anaphylaxis    Other reaction(s): Other (See Comments) Numbness ,sob, headaches, blurred vision  Other reaction(s): Other (See Comments), Unknown Numbness ,sob, headaches, blurred vision Numbness ,sob, headaches, blurred  vision  Other reaction(s): Other (See Comments), Unknown Numbness ,sob, headaches, blurred vision Numbness ,sob, headaches, blurred vision   Numbness ,sob, headaches, blurred vision Other reaction(s): Other (See Comments), Unknown Numbness ,sob, headaches, blurred vision Numbness ,sob, headaches, blurred vision Other reaction(s): Other (See Comments), Unknown Numbness ,sob, headaches, blurred vision Numbness ,sob, headaches, blurred vision   Ziprasidone Hcl Anaphylaxis and Other (See Comments)    Other reaction(s): Other (See Comments), Unknown Numbness ,sob, headaches, blurred vision Numbness ,sob, headaches, blurred vision   Ace Inhibitors Rash   Erythromycin Rash and Hives   Hydromorphone Rash   Lamictal [Lamotrigine] Rash    Other reaction(s): Unknown   Other Rash    rash   Strawberry (Diagnostic) Rash   Sulfa Antibiotics Hives and Rash    Other reaction(s): Unknown  Other reaction(s): Unknown Other reaction(s): Unknown Other reaction(s): Unknown    Review of Systems  HENT:  Positive for congestion and sinus pain.   All other systems reviewed and are negative.      Objective:   BP 102/74   Pulse 67   Ht 5\' 4"  (1.626 m)   Wt 253 lb 9.6 oz (115 kg)   SpO2 98%   BMI 43.53 kg/m   Vitals:   08/27/23 1030  BP: 102/74  Pulse: 67  Height: 5\' 4"  (1.626 m)  Weight: 253 lb 9.6 oz (115 kg)  SpO2: 98%  BMI (Calculated): 43.51    Physical Exam Vitals and nursing note reviewed.  Constitutional:      Appearance: Normal appearance. She is normal weight.  HENT:     Head: Normocephalic.  Eyes:     Extraocular Movements: Extraocular movements intact.     Conjunctiva/sclera: Conjunctivae normal.     Pupils: Pupils are equal, round, and reactive to light.  Cardiovascular:     Rate and Rhythm: Normal rate.  Pulmonary:     Effort: Pulmonary effort is normal.  Neurological:     General: No focal deficit present.     Mental Status: She is alert and oriented to  person, place, and time. Mental status is at baseline.  Psychiatric:        Mood  and Affect: Mood normal.        Behavior: Behavior normal.        Thought Content: Thought content normal.      No results found for any visits on 08/27/23.  Recent Results (from the past 2160 hours)  Celiac Disease Ab Screen w/Rfx     Status: None   Collection Time: 08/06/23 11:33 AM  Result Value Ref Range   Antigliadin Abs, IgA 6 0 - 19 units    Comment:                    Negative                   0 - 19                    Weak Positive             20 - 30                    Moderate to Strong Positive   >30    Transglutaminase IgA <2 0 - 3 U/mL    Comment:                               Negative        0 -  3                               Weak Positive   4 - 10                               Positive           >10  Tissue Transglutaminase (tTG) has been identified  as the endomysial antigen.  Studies have demonstr-  ated that endomysial IgA antibodies have over 99%  specificity for gluten sensitive enteropathy.    IgA/Immunoglobulin A, Serum 258 87 - 352 mg/dL  [id number]     Status: Abnormal   Collection Time: 08/06/23 11:33 AM  Result Value Ref Range   TSH 10.300 (H) 0.450 - 4.500 uIU/mL   T3, Free 2.9 2.0 - 4.4 pg/mL   Free T4 0.70 (L) 0.82 - 1.77 ng/dL  Pancreatic Elastase, Fecal     Status: None   Collection Time: 08/09/23  8:00 AM  Result Value Ref Range   Pancreatic Elastase, Fecal >800 >200 ug Elast./g    Comment:        Severe Pancreatic Insufficiency:          <100        Moderate Pancreatic Insufficiency:   100 - 200        Normal:                                   >200   Culture, Stool     Status: None   Collection Time: 08/09/23  8:00 AM   Specimen: Stool   ST  Result Value Ref Range   Salmonella/Shigella Screen Final report    Stool Culture result 1 (RSASHR) Comment     Comment: No Salmonella or Shigella recovered.   Campylobacter Culture Final report    Stool  Culture result 1 (CMPCXR) Comment  Comment: No Campylobacter species isolated.   E coli, Shiga toxin Assay Negative Negative  Ova and parasite examination     Status: None   Collection Time: 08/09/23 11:20 AM   Specimen: Stool   ST  Result Value Ref Range   OVA + PARASITE EXAM Final report     Comment: These results were obtained using wet preparation(s) and trichrome stained smear. This test does not include testing for Cryptosporidium parvum, Cyclospora, or Microsporidia.    O&P result 1 Comment     Comment: No ova, cysts, or parasites seen. One negative specimen does not rule out the possibility of a parasitic infection.   Hemoglobin A1c     Status: Abnormal   Collection Time: 09/27/23  9:39 AM  Result Value Ref Range   Hgb A1c MFr Bld 6.2 (H) 4.8 - 5.6 %    Comment:          Prediabetes: 5.7 - 6.4          Diabetes: >6.4          Glycemic control for adults with diabetes: <7.0    Est. average glucose Bld gHb Est-mCnc 131 mg/dL  TSH     Status: Abnormal   Collection Time: 09/27/23  9:39 AM  Result Value Ref Range   TSH 10.300 (H) 0.450 - 4.500 uIU/mL  CMP14+EGFR     Status: Abnormal   Collection Time: 09/27/23  9:39 AM  Result Value Ref Range   Glucose 106 (H) 70 - 99 mg/dL   BUN 12 6 - 24 mg/dL   Creatinine, Ser 4.09 0.57 - 1.00 mg/dL   eGFR 96 >81 XB/JYN/8.29   BUN/Creatinine Ratio 16 9 - 23   Sodium 143 134 - 144 mmol/L   Potassium 4.0 3.5 - 5.2 mmol/L   Chloride 104 96 - 106 mmol/L   CO2 28 20 - 29 mmol/L   Calcium 8.7 8.7 - 10.2 mg/dL   Total Protein 6.4 6.0 - 8.5 g/dL   Albumin 3.9 3.8 - 4.9 g/dL   Globulin, Total 2.5 1.5 - 4.5 g/dL   Bilirubin Total 0.3 0.0 - 1.2 mg/dL   Alkaline Phosphatase 109 44 - 121 IU/L   AST 19 0 - 40 IU/L   ALT 14 0 - 32 IU/L  Lipid panel     Status: Abnormal   Collection Time: 09/27/23  9:39 AM  Result Value Ref Range   Cholesterol, Total 188 100 - 199 mg/dL   Triglycerides 88 0 - 149 mg/dL   HDL 56 >56 mg/dL   VLDL  Cholesterol Cal 16 5 - 40 mg/dL   LDL Chol Calc (NIH) 213 (H) 0 - 99 mg/dL   Chol/HDL Ratio 3.4 0.0 - 4.4 ratio    Comment:                                   T. Chol/HDL Ratio                                             Men  Women                               1/2 Avg.Risk  3.4    3.3  Avg.Risk  5.0    4.4                                2X Avg.Risk  9.6    7.1                                3X Avg.Risk 23.4   11.0   CBC With Diff/Platelet     Status: Abnormal   Collection Time: 09/27/23  9:39 AM  Result Value Ref Range   WBC 6.7 3.4 - 10.8 x10E3/uL   RBC 3.93 3.77 - 5.28 x10E6/uL   Hemoglobin 11.1 11.1 - 15.9 g/dL   Hematocrit 16.1 09.6 - 46.6 %   MCV 89 79 - 97 fL   MCH 28.2 26.6 - 33.0 pg   MCHC 31.6 31.5 - 35.7 g/dL   RDW 04.5 40.9 - 81.1 %   Platelets 300 150 - 450 x10E3/uL   Neutrophils 61 Not Estab. %   Lymphs 21 Not Estab. %   Monocytes 9 Not Estab. %   Eos 8 Not Estab. %   Basos 1 Not Estab. %   Neutrophils Absolute 4.1 1.4 - 7.0 x10E3/uL   Lymphocytes Absolute 1.4 0.7 - 3.1 x10E3/uL   Monocytes Absolute 0.6 0.1 - 0.9 x10E3/uL   EOS (ABSOLUTE) 0.5 (H) 0.0 - 0.4 x10E3/uL   Basophils Absolute 0.1 0.0 - 0.2 x10E3/uL   Immature Granulocytes 0 Not Estab. %   Immature Grans (Abs) 0.0 0.0 - 0.1 x10E3/uL       Assessment & Plan:   Problem List Items Addressed This Visit       Respiratory   Allergic rhinitis - Primary   Patient stable.  Well controlled with current therapy.   Continue current meds.        Other Visit Diagnoses       Acute non-recurrent maxillary sinusitis       Symptoms have resolved.  Will reassess as needed.     Itchy eyes       Symptoms have improved.  Will reassess at follow up.       Return as previously scheduled.   Total time spent: 20 minutes  Trenda Frisk, FNP  08/27/2023   This document may have been prepared by [facility] Voice Recognition software and as such may include unintentional  dictation errors.

## 2023-10-27 NOTE — Assessment & Plan Note (Signed)
 Patient stable.  Well controlled with current therapy.   Continue current meds.

## 2023-10-30 DIAGNOSIS — G4733 Obstructive sleep apnea (adult) (pediatric): Secondary | ICD-10-CM | POA: Diagnosis not present

## 2023-11-13 ENCOUNTER — Ambulatory Visit: Admitting: Family

## 2023-11-13 DIAGNOSIS — N390 Urinary tract infection, site not specified: Secondary | ICD-10-CM

## 2023-11-13 LAB — POCT URINALYSIS DIPSTICK
Bilirubin, UA: NEGATIVE
Blood, UA: 10
Glucose, UA: NEGATIVE
Ketones, UA: NEGATIVE
Nitrite, UA: NEGATIVE
Protein, UA: NEGATIVE
Spec Grav, UA: 1.015 (ref 1.010–1.025)
Urobilinogen, UA: 0.2 U/dL
pH, UA: 6 (ref 5.0–8.0)

## 2023-11-14 DIAGNOSIS — N390 Urinary tract infection, site not specified: Secondary | ICD-10-CM | POA: Diagnosis not present

## 2023-11-15 ENCOUNTER — Telehealth: Payer: Self-pay

## 2023-11-15 LAB — URINALYSIS, ROUTINE W REFLEX MICROSCOPIC
Bilirubin, UA: NEGATIVE
Glucose, UA: NEGATIVE
Ketones, UA: NEGATIVE
Nitrite, UA: POSITIVE — AB
Specific Gravity, UA: 1.019 (ref 1.005–1.030)
Urobilinogen, Ur: 0.2 mg/dL (ref 0.2–1.0)
pH, UA: 6.5 (ref 5.0–7.5)

## 2023-11-15 LAB — MICROSCOPIC EXAMINATION
Casts: NONE SEEN /LPF
WBC, UA: >30 /HPF — AB (ref 0–5)

## 2023-11-15 MED ORDER — NITROFURANTOIN MONOHYD MACRO 100 MG PO CAPS: 100.0000 mg | ORAL_CAPSULE | Freq: Two times a day (BID) | ORAL | 0 refills | Status: DC

## 2023-11-15 NOTE — Telephone Encounter (Signed)
 948A. Patient called stating that she seen her results via mychart and wants something called in she can't go the whole weekend like this

## 2023-11-18 LAB — URINE CULTURE

## 2023-11-18 NOTE — Telephone Encounter (Signed)
 Per amanda verbal she called something in Friday for the patient

## 2023-11-19 MED ORDER — CIPROFLOXACIN HCL 500 MG PO TABS: 500.0000 mg | ORAL_TABLET | Freq: Two times a day (BID) | ORAL | 0 refills | Status: AC

## 2023-11-28 DIAGNOSIS — G4733 Obstructive sleep apnea (adult) (pediatric): Secondary | ICD-10-CM | POA: Diagnosis not present

## 2023-11-28 DIAGNOSIS — E042 Nontoxic multinodular goiter: Secondary | ICD-10-CM | POA: Diagnosis not present

## 2023-11-28 DIAGNOSIS — E063 Autoimmune thyroiditis: Secondary | ICD-10-CM | POA: Diagnosis not present

## 2023-12-01 NOTE — Progress Notes (Signed)
   CHIEF COMPLAINT  UA/ only visit fot UTI     REASON FOR VISIT  Possible UTI, UA Visit Only      ASSESSMENT & PLAN Alexis Christensen was seen today for urinary tract infection.  Diagnoses and all orders for this visit:  Urinary tract infection without hematuria, site unspecified -     POCT Urinalysis Dipstick (81002) -     Urinalysis, Routine w reflex microscopic -     Urine Culture  Other orders -     Microscopic Examination -     nitrofurantoin , macrocrystal-monohydrate, (MACROBID ) 100 MG capsule; Take 1 capsule (100 mg total) by mouth 2 (two) times daily.     Patient notified.  Total time spent: 5 minutes  Trenda Frisk, FNP 11/13/2023

## 2023-12-26 DIAGNOSIS — E042 Nontoxic multinodular goiter: Secondary | ICD-10-CM | POA: Diagnosis not present

## 2023-12-26 DIAGNOSIS — E063 Autoimmune thyroiditis: Secondary | ICD-10-CM | POA: Diagnosis not present

## 2023-12-26 NOTE — Progress Notes (Addendum)
 HPI: Alexis Christensen is a 53 y.o. female who presents for follow up hypothyroidism.  She was diagnosed with hypothyroidism when she was a teenager.  She was started on medication but went off for a time.  She restarted medication when she got pregnant in 2006.    She stopped her LT4 in 3/21 as she got busy with her children and grandchildren.  Her dose has fluctuated over time, partly due to noncompliance.  I last saw her in 11/24.  At that time, I encouraged complice with her meds.    She is currently on 200 mcg LT4 daily and 10 mcg Cytomel daily (two 5 mcg tabs).   She says she has been taking it correctly and irregularly.  She is not on birth control or Ca/Fe supplementation.  She also has thyroid  nodules.  She cannot tell the nodules are there.  They do not interfere with swallowing.  She has no anterior pain/swelling in her neck.  She is concerned about her thyroid .    ROS:  No chest pain.  No shortness of breath.   Medical History: Past Medical History:  Diagnosis Date  . Anemia   . Anxiety   . Asthma without status asthmaticus (HHS-HCC)   . Bipolar disorder (CMS/HHS-HCC)   . Depression   . DVT (deep venous thrombosis) (CMS/HHS-HCC)   . History of chickenpox   . History of suicide attempt   . Hyperlipidemia   . Hypothyroidism   . Migraines   . Osteoarthritis     Surgical History: Past Surgical History:  Procedure Laterality Date  . CHOLECYSTECTOMY  2004  . KNEE ARTHROSCOPY Right 07/06/2008   Right knee arthroscopy, partial medial meniscectomy, arthroscopic lateral release.  SABRA KNEE ARTHROSCOPY Right 06/07/2009   Right knee arthroscopy, partial medial meniscectomy.  SABRA JOINT REPLACEMENT Right 11/06/2012   Right total knee replacement.  . COLONOSCOPY  09/16/2014   @ [facility] at [facility] - sigmoid diverticulosis, tortuous colon, stool in ascending colon  . UPPER GASTROINTESTINAL ENDOSCOPY  09/16/2014   @ [facility] at [facility] - small hiatal hernia, few fundic gland polyps, normal  duodenum  . Knee arthroscopy lateral and partial synovectomy Left 10/18/2015   Dr.Menz  . COLONOSCOPY  06/24/2019   Tubular adenoma of the colon/Repeat 19yrs/TKT  . EGD  06/24/2019   Gastritis/No repeat/TKT  . wrist surgery Left 06/30/2021   Tendon release  . HYSTERECTOMY    . Laparoscopy    . Left ankle surgery x2    . R Shoulder Arthroscopy 02/2013    . 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 Right wrist surgery x5      Social History:  reports that she quit smoking about 4 years ago. Her smoking use included cigarettes. She has never used smokeless tobacco. She reports that she does not currently use alcohol. She reports that she does not use drugs.  Family History: family history includes Alcohol abuse in her father and mother; Stroke in her sister. She was adopted.  Medications: Current Outpatient Medications  Medication Sig Dispense Refill  . amitriptyline (ELAVIL) 75 MG tablet TAKE 1 TABLET BY MOUTH EVERY DAY AT NIGHT 90 tablet 0  . ARIPiprazole (ABILIFY) 30 MG tablet Take 30 mg by mouth once daily    . aspirin 81 MG chewable tablet Aspirin 81 MG Oral Tablet Chewable QTY: 30 tablet Days: 30 Refills: 0  Written: 01/23/21 Patient Instructions: once a day    . buprenorphine-naloxone (SUBOXONE) 8-2 mg SL film Place  under the tongue 3 (three) times daily    . celecoxib (CELEBREX) 100 MG capsule TAKE 1 CAPSULE(100 MG) BY MOUTH TWICE DAILY 60 capsule 1  . ciprofloxacin  HCl (CIPRO ) 250 MG tablet Take 250 mg by mouth once daily    . colestipoL  (COLESTID ) 1 gram tablet TAKE 2 TABLETS (2 G TOTAL) BY MOUTH 2 (TWO) TIMES DAILY TAKE AT LUNCH TIME. 360 tablet 0  . dicyclomine (BENTYL) 10 mg capsule     . DULoxetine (CYMBALTA) 60 MG DR capsule Take 60 mg by mouth 2 (two) times daily      . famotidine  (PEPCID ) 20 MG tablet Take 20 mg by mouth 2 (two) times daily    . fluticasone  propionate (FLONASE ) 50 mcg/actuation nasal spray Place 1 spray into both nostrils once  daily    . FUROsemide (LASIX) 40 MG tablet Take by mouth once daily    . gabapentin  600 mg Tb24 600 mg 2 (two) times daily One tablet by mouth twice a day and two at bedtime    . hydrOXYzine  (VISTARIL ) 50 MG capsule     . ketotifen  (ZADITOR ) 0.025 % (0.035 %) ophthalmic solution Place 1 drop into both eyes 2 (two) times daily    . KLOR-CON  M10 10 mEq ER tablet Take 10 mEq by mouth once daily    . levothyroxine  (SYNTHROID ) 200 MCG tablet Take 1 tablet (200 mcg total) by mouth every morning before breakfast (0630) TAKE ON AN EMPTY STOMACH WITH A GLASS OF WATER AT LEAST 30-60 MINUTES BEFORE BREAKFAST 30 tablet 2  . liothyronine (CYTOMEL) 5 MCG tablet Take 2 tablets (10 mcg total) by mouth once daily Take on an empty stomach with a glass of water at least 30-60 minutes before breakfast. 180 tablet 4  . lisinopriL  (ZESTRIL ) 5 MG tablet Take 5 mg by mouth once daily    . methocarbamoL  (ROBAXIN ) 500 MG tablet Take 500 mg by mouth 3 (three) times daily    . metoprolol tartrate (LOPRESSOR) 50 MG tablet Take 50 mg by mouth once daily    . mupirocin  (BACTROBAN ) 2 % ointment Apply 2 g topically 2 (two) times daily APPLY TO AFFECTED AREA    . nitrofurantoin  (MACRODANTIN ) 100 MG capsule Take 1 capsule (100 mg total) by mouth daily at 6 (six) AM.    . nitrofurantoin , macrocrystal-monohydrate, (MACROBID ) 100 MG capsule     . nortriptyline (PAMELOR) 10 MG capsule Take 10 mg in the morning 30 capsule 2  . nortriptyline (PAMELOR) 50 MG capsule Take 1 capsule (50 mg total) by mouth at bedtime for 30 days Discontinue amitriptyline 30 capsule 2  . nortriptyline (PAMELOR) 50 MG capsule Take 1 capsule (50 mg total) by mouth at bedtime 30 capsule 1  . pantoprazole  (PROTONIX ) 40 MG DR tablet Take by mouth 2 (two) times daily before meals    . rosuvastatin (CRESTOR) 40 MG tablet Rosuvastatin Calcium 40 MG Oral Tablet QTY: 90 tablet Days: 90 Refills: 1  Written: 03/09/19 Patient Instructions: Take 1 tablet by mouth nightly at  bedtime for high cholesterol    . spironolactone (ALDACTONE) 25 MG tablet TAKE 1/2 (HALF) TABLET BY MOUTH ONCE DAILY.    . tirzepatide  (ZEPBOUND ) 2.5 mg/0.5 mL pen injector Inject 2.5 mg subcutaneously every 7 (seven) days    . albuterol  90 mcg/actuation inhaler Inhale 2 inhalations into the lungs every 6 (six) hours as needed 1 each 1  . ARIPiprazole (ABILIFY) 20 MG tablet Take 30 mg by mouth once daily (Patient not taking:  Reported on 12/26/2023)    . cyanocobalamin  (VITAMIN B12) 500 MCG tablet Take 1 tablet (500 mcg total) by mouth daily. (Patient not taking: Reported on 12/26/2023)    . empagliflozin  (JARDIANCE ) 25 mg tablet Take 1 tablet by mouth once daily (Patient not taking: Reported on 12/26/2023)    . ergocalciferol , vitamin D2, 1,250 mcg (50,000 unit) capsule twice a week (Patient not taking: Reported on 12/26/2023)    . fexofenadine  (ALLEGRA ) 180 MG tablet Take 180 mg by mouth once daily (Patient not taking: Reported on 12/26/2023)    . montelukast (SINGULAIR) 10 mg tablet TAKE 1 TABLET(10 MG) BY MOUTH AT BEDTIME (Patient not taking: Reported on 12/26/2023) 30 tablet 3  . pregabalin (LYRICA) 25 MG capsule Take 25 mg twice daily for one week, then increase to 50 mg twice daily. (Patient not taking: Reported on 12/26/2023) 120 capsule 1  . REPATHA SURECLICK 140 mg/mL PnIj Inject subcutaneously (Patient not taking: Reported on 12/26/2023)     No current facility-administered medications for this visit.    Allergies: Allergies  Allergen Reactions  . Sulfacetamide Sodium Hives  . Ziprasidone Anaphylaxis and Other (See Comments)    Numbness ,sob, headaches, blurred vision   Other reaction(s): Other (See Comments), Unknown Numbness ,sob, headaches, blurred vision Numbness ,sob, headaches, blurred vision   Other reaction(s): Other (See Comments), Unknown Numbness ,sob, headaches, blurred vision Numbness ,sob, headaches, blurred vision    Numbness ,sob, headaches, blurred vision Other  reaction(s): Other (See Comments), Unknown Numbness ,sob, headaches, blurred vision Numbness ,sob, headaches, blurred vision Other reaction(s): Other (See Comments), Unknown Numbness ,sob, headaches, blurred vision Numbness ,sob, headaches, blurred vision   . Dilaudid [Hydromorphone] Rash  . Erythromycin Hives  . Other Rash    rash  . Ace Inhibitors Rash  . Lamotrigine Unknown, Rash and Other (See Comments)    Other reaction(s): Unknown  . Strawberry Extract Rash  . Sulfa (Sulfonamide Antibiotics) Hives, Rash and Unknown    Other reaction(s): Unknown  Other reaction(s): Unknown    Other reaction(s): Unknown Other reaction(s): Unknown Other reaction(s): Unknown    Physical Exam: Vitals:   12/26/23 0807  BP: 102/70  Pulse: 72  SpO2: 99%  Weight: (!) 111.1 kg (245 lb)  Height: 162.6 cm (5' 4)    Body mass index is 42.05 kg/m. Gen: WDWN in NAD   Physical exam otherwise deferred due to coronavirus precautions.   Labs: 06/14/2011:  TSH = 11.785 02/16/2015:  TSH = 10.871 03/12/2018:  Thyroid  u/s showed nodules with heterogeneous appearance.  The right upper mid nodule measured 1.1x0.8x0.9 and the right lower nodule measured 0.8x0.4x0.7.  There were no nodules seen on the left side.   11/13/2018:  TSH = 20.917 03/27/2019:  TPO Ab = 147  03/29/2021:  TSH = 13.421 06/21/2021:  TSH = 8.66  07/21/2021:  TSH = 7.988.   12/07/2021: TSH = 9.947. 12/07/2021: Thyroid  ultrasound with multiple nodules.  Largest in isthmus measuring  1.9x1.1x2.1 cm (0.8x0.7x0.7 in 9/18).  Also right mid 1.5x1.3x1.4 cm nodule.  01/03/2022:  FNA of 1.9x1.1x2.1 cm isthmus and 1.5x1.3x1.4 cm right mid nodules both benign.   05/10/2022: TSH = 11.156.  06/12/2022:  A1c=6.0.  K/Cr/Ca= 4.4/0.61/8.9.  Chol=179/112/56/103.  LFTS nl.  TSH=13.2  09/26/2022: TSH = 7.896.  Free T4 = 0.61.  Free T3 =2.7. 11/15/2022: Thyroid  ultrasound with slight enlargement of dominant isthmus nodule and possibly of right lower nodule.     11/15/2022: TSH = 11.296.  Free T4 = 0.67.  Free T3 =  3.2. 05/22/2023:  UDY=87.305.  FT4=0.6.  FT3=2.5.    08/06/2023: TSH = 10.3.  Free T4 = 0.7.  Free T3 = 2.9.   09/27/2023: TSH = 10.3 11/28/2023:  TSH=10.097,  FT4=0.51,  FT3=2.7.   11/28/23:  Thyroid  ultrasound without significant change.  Assessment/Plan: 1.  Hypothyroidism.  Her TSH was elevated in May on LT4 200 mcg daily and 10 mcg Cytomel daily (2 tabs of 5 mcg daily).  It seems like she has been compliant recently based on pharmacy fill history.  I will therefore increase her LT4 to 224 mcg daily (two 112 mcg tabs daily).  I told her to continue her cytomel.    I encouraged compliance with her meds.    2.  Noncompliance.  She has a hx of noncompliance.    3.  MNG.  Thyroid  u/s in 8/19 showed 2 nodules in the right upper and lower pole.  Most recent u/s in 5/23 showed growth of the nodule in the isthmus so FNA was recommended of the isthmus nodule given enlargement over time as well as the right mid nodule.  FNA returned benign in 6/23.  U/s in 5/24 showed  slight enlargement as compared to 5/23.  In 5/25 u/s showed no significant change.  Will continue to follow and plan an u/s 1-2 years.   4.  RTC in 4 months.    05/20/2024: TSH = 11.414.  Free T4 = 0.61.  Free T3 =2.5.  Will review at follow-up  This note is partially prepared by Earla Daria Messier, Scribe, in the presence of and acting as the scribe of Dr. Debby Breaker , MD.     [facility], MD

## 2023-12-29 DIAGNOSIS — G4733 Obstructive sleep apnea (adult) (pediatric): Secondary | ICD-10-CM | POA: Diagnosis not present

## 2024-01-03 DIAGNOSIS — M1712 Unilateral primary osteoarthritis, left knee: Secondary | ICD-10-CM | POA: Diagnosis not present

## 2024-01-09 ENCOUNTER — Other Ambulatory Visit: Payer: Self-pay | Admitting: Physician Assistant

## 2024-01-09 ENCOUNTER — Ambulatory Visit
Admission: RE | Admit: 2024-01-09 | Discharge: 2024-01-09 | Disposition: A | Discharge status: Home / Self Care | Source: Ambulatory Visit | Attending: Physician Assistant | Admitting: Physician Assistant

## 2024-01-09 DIAGNOSIS — M7989 Other specified soft tissue disorders: Secondary | ICD-10-CM

## 2024-01-09 DIAGNOSIS — M1712 Unilateral primary osteoarthritis, left knee: Secondary | ICD-10-CM | POA: Diagnosis not present

## 2024-01-09 DIAGNOSIS — M25462 Effusion, left knee: Secondary | ICD-10-CM | POA: Diagnosis not present

## 2024-01-10 DIAGNOSIS — Z0189 Encounter for other specified special examinations: Secondary | ICD-10-CM | POA: Diagnosis not present

## 2024-01-11 DIAGNOSIS — M25462 Effusion, left knee: Secondary | ICD-10-CM | POA: Diagnosis not present

## 2024-01-15 DIAGNOSIS — M25562 Pain in left knee: Secondary | ICD-10-CM | POA: Diagnosis not present

## 2024-01-28 DIAGNOSIS — G4733 Obstructive sleep apnea (adult) (pediatric): Secondary | ICD-10-CM | POA: Diagnosis not present

## 2024-02-05 DIAGNOSIS — S83232A Complex tear of medial meniscus, current injury, left knee, initial encounter: Secondary | ICD-10-CM | POA: Diagnosis not present

## 2024-02-05 DIAGNOSIS — M1712 Unilateral primary osteoarthritis, left knee: Secondary | ICD-10-CM | POA: Diagnosis not present

## 2024-02-05 DIAGNOSIS — M7122 Synovial cyst of popliteal space [Baker], left knee: Secondary | ICD-10-CM | POA: Diagnosis not present

## 2024-02-12 DIAGNOSIS — M79675 Pain in left toe(s): Secondary | ICD-10-CM | POA: Diagnosis not present

## 2024-02-12 DIAGNOSIS — B351 Tinea unguium: Secondary | ICD-10-CM | POA: Diagnosis not present

## 2024-02-12 DIAGNOSIS — Q828 Other specified congenital malformations of skin: Secondary | ICD-10-CM | POA: Diagnosis not present

## 2024-02-12 DIAGNOSIS — M7122 Synovial cyst of popliteal space [Baker], left knee: Secondary | ICD-10-CM | POA: Diagnosis not present

## 2024-02-12 DIAGNOSIS — M79674 Pain in right toe(s): Secondary | ICD-10-CM | POA: Diagnosis not present

## 2024-02-12 DIAGNOSIS — L600 Ingrowing nail: Secondary | ICD-10-CM | POA: Diagnosis not present

## 2024-02-19 ENCOUNTER — Ambulatory Visit (INDEPENDENT_AMBULATORY_CARE_PROVIDER_SITE_OTHER): Admitting: Family

## 2024-02-19 VITALS — BP 115/60 | HR 60 | Ht 64.0 in | Wt 247.8 lb

## 2024-02-19 DIAGNOSIS — M25512 Pain in left shoulder: Secondary | ICD-10-CM | POA: Diagnosis not present

## 2024-02-19 DIAGNOSIS — N3001 Acute cystitis with hematuria: Secondary | ICD-10-CM | POA: Diagnosis not present

## 2024-02-19 DIAGNOSIS — Z0130 Encounter for examination of blood pressure without abnormal findings: Secondary | ICD-10-CM

## 2024-02-19 LAB — POCT URINALYSIS DIPSTICK
Bilirubin, UA: NEGATIVE
Blood, UA: NEGATIVE
Glucose, UA: NEGATIVE
Ketones, UA: NEGATIVE
Nitrite, UA: NEGATIVE
Protein, UA: NEGATIVE
Spec Grav, UA: 1.02
Urobilinogen, UA: 0.2 U/dL
pH, UA: 6

## 2024-02-19 MED ORDER — PREDNISONE 20 MG PO TABS: 40.0000 mg | ORAL_TABLET | Freq: Every day | ORAL | 0 refills | Status: DC

## 2024-02-19 MED ORDER — NITROFURANTOIN MONOHYD MACRO 100 MG PO CAPS
100.0000 mg | ORAL_CAPSULE | Freq: Two times a day (BID) | ORAL | 0 refills | Status: DC
Start: 2024-02-19 — End: 2024-05-18

## 2024-02-19 MED ORDER — CYCLOBENZAPRINE HCL 5 MG PO TABS: 5.0000 mg | ORAL_TABLET | Freq: Two times a day (BID) | ORAL | 0 refills | Status: DC | PRN

## 2024-02-20 ENCOUNTER — Encounter: Payer: Self-pay | Admitting: Family

## 2024-02-20 LAB — URINALYSIS, ROUTINE W REFLEX MICROSCOPIC
Bilirubin, UA: NEGATIVE
Glucose, UA: NEGATIVE
Ketones, UA: NEGATIVE
Leukocytes,UA: NEGATIVE
Nitrite, UA: NEGATIVE
Protein,UA: NEGATIVE
RBC, UA: NEGATIVE
Specific Gravity, UA: 1.011 (ref 1.005–1.030)
Urobilinogen, Ur: 0.2 mg/dL (ref 0.2–1.0)
pH, UA: 7.5 (ref 5.0–7.5)

## 2024-02-20 NOTE — Progress Notes (Signed)
 Acute Office Visit  Subjective:     Patient ID: Alexis Christensen, female    DOB: 1970/11/26, 53 y.o.   MRN: [id number]  Patient is in today for  Chief Complaint  Patient presents with   Acute Visit    Possible UTI, frequent urination, left shoulder pain    Patient is here today with two concerns.  She is concerned first that she might have a UTI.  She has been having urinary frequency and urgency. Has also been having some dysuria.  She also reports that she has been having some pain in her left shoulder, which started a few weeks ago. She originally thought she slept on it wrong, but has since continued to have pain, and it is now radiating down her arm to her fingers. She describes it as sharp and shooting . She did try a muscle relaxer that were given to her previously for her knee, which did not help.  No other concerns at this time.      Review of Systems  Genitourinary:  Positive for dysuria, frequency and urgency.  Musculoskeletal:  Positive for joint pain.  All other systems reviewed and are negative.       Objective:    BP 115/60   Pulse 60   Ht 5' 4 (1.626 m)   Wt 247 lb 12.8 oz (112.4 kg)   SpO2 97%   BMI 42.53 kg/m   Physical Exam Vitals and nursing note reviewed.  Constitutional:      Appearance: Normal appearance. She is normal weight.  HENT:     Head: Normocephalic.  Eyes:     Extraocular Movements: Extraocular movements intact.     Conjunctiva/sclera: Conjunctivae normal.     Pupils: Pupils are equal, round, and reactive to light.  Cardiovascular:     Rate and Rhythm: Normal rate.  Pulmonary:     Effort: Pulmonary effort is normal.  Neurological:     General: No focal deficit present.     Mental Status: She is alert and oriented to person, place, and time. Mental status is at baseline.  Psychiatric:        Mood and Affect: Mood normal.        Behavior: Behavior normal.        Thought Content: Thought content normal.        Judgment:  Judgment normal.     Results for orders placed or performed in visit on 02/19/24  Urinalysis, Routine w reflex microscopic  Result Value Ref Range   Specific Gravity, UA 1.011 1.005 - 1.030   pH, UA 7.5 5.0 - 7.5   Color, UA Yellow Yellow   Appearance Ur Clear Clear   Leukocytes,UA Negative Negative   Protein,UA Negative Negative/Trace   Glucose, UA Negative Negative   Ketones, UA Negative Negative   RBC, UA Negative Negative   Bilirubin, UA Negative Negative   Urobilinogen, Ur 0.2 0.2 - 1.0 mg/dL   Nitrite, UA Negative Negative   Microscopic Examination Comment   POCT Urinalysis Dipstick (18997)  Result Value Ref Range   Color, UA yelllow    Clarity, UA clear    Glucose, UA Negative Negative   Bilirubin, UA negative    Ketones, UA negative    Spec Grav, UA 1.020 1.010 - 1.025   Blood, UA negative    pH, UA 6.0 5.0 - 8.0   Protein, UA Negative Negative   Urobilinogen, UA 0.2 0.2 or 1.0 E.U./dL   Nitrite, UA negative  Leukocytes, UA Trace (A) Negative   Appearance     Odor      Recent Results (from the past 2160 hours)  POCT Urinalysis Dipstick (18997)     Status: Abnormal   Collection Time: 02/19/24  3:47 PM  Result Value Ref Range   Color, UA yelllow    Clarity, UA clear    Glucose, UA Negative Negative   Bilirubin, UA negative    Ketones, UA negative    Spec Grav, UA 1.020 1.010 - 1.025   Blood, UA negative    pH, UA 6.0 5.0 - 8.0   Protein, UA Negative Negative   Urobilinogen, UA 0.2 0.2 or 1.0 E.U./dL   Nitrite, UA negative    Leukocytes, UA Trace (A) Negative   Appearance     Odor    Urinalysis, Routine w reflex microscopic     Status: None   Collection Time: 02/19/24  3:58 PM  Result Value Ref Range   Specific Gravity, UA 1.011 1.005 - 1.030   pH, UA 7.5 5.0 - 7.5   Color, UA Yellow Yellow   Appearance Ur Clear Clear   Leukocytes,UA Negative Negative   Protein,UA Negative Negative/Trace   Glucose, UA Negative Negative   Ketones, UA Negative  Negative   RBC, UA Negative Negative   Bilirubin, UA Negative Negative   Urobilinogen, Ur 0.2 0.2 - 1.0 mg/dL   Nitrite, UA Negative Negative   Microscopic Examination Comment     Comment: Microscopic not indicated and not performed.    Allergies as of 02/19/2024       Reactions   Geodon [ziprasidone Hydrochloride] Other (See Comments)   Numbness ,sob, headaches, blurred vision   Sulfacetamide Sodium Hives   Ziprasidone Anaphylaxis   Other reaction(s): Other (See Comments) Numbness ,sob, headaches, blurred vision Other reaction(s): Other (See Comments), Unknown Numbness ,sob, headaches, blurred vision Numbness ,sob, headaches, blurred vision Other reaction(s): Other (See Comments), Unknown Numbness ,sob, headaches, blurred vision Numbness ,sob, headaches, blurred vision Numbness ,sob, headaches, blurred vision Other reaction(s): Other (See Comments), Unknown Numbness ,sob, headaches, blurred vision Numbness ,sob, headaches, blurred vision Other reaction(s): Other (See Comments), Unknown Numbness ,sob, headaches, blurred vision Numbness ,sob, headaches, blurred vision   Ziprasidone Hcl Anaphylaxis, Other (See Comments)   Other reaction(s): Other (See Comments), Unknown Numbness ,sob, headaches, blurred vision Numbness ,sob, headaches, blurred vision   Ace Inhibitors Rash   Erythromycin Rash, Hives   Hydromorphone Rash   Lamictal [lamotrigine] Rash   Other reaction(s): Unknown   Other Rash   rash   Strawberry (diagnostic) Rash   Sulfa Antibiotics Hives, Rash   Other reaction(s): Unknown Other reaction(s): Unknown Other reaction(s): Unknown Other reaction(s): Unknown        Medication List        Accurate as of February 19, 2024 11:59 PM. If you have any questions, ask your nurse or doctor.          albuterol  108 (90 Base) MCG/ACT inhaler Commonly known as: VENTOLIN  HFA Inhale 1 puff into the lungs every 6 (six) hours as needed for wheezing or shortness of  breath.   ARIPiprazole 30 MG tablet Commonly known as: ABILIFY Take 30 mg by mouth daily.   aspirin 81 MG chewable tablet Aspirin 81 MG Oral Tablet Chewable QTY: 30 tablet Days: 30 Refills: 0  Written: 01/23/21 Patient Instructions: once a day   Breztri Aerosphere 160-9-4.8 MCG/ACT Aero inhaler Generic drug: budesonide-glycopyrrolate -formoterol Inhale 2 puffs into the lungs in the morning and at  bedtime.   celecoxib 100 MG capsule Commonly known as: CELEBREX Take 100 mg by mouth 2 (two) times daily.   colestipol  1 g tablet Commonly known as: COLESTID  TAKE 2 TABLETS(2 GRAMS) BY MOUTH TWICE DAILY   colestipol  1 g tablet Commonly known as: COLESTID  TAKE 2 TABLETS(2 GRAMS) BY MOUTH TWICE DAILY   cyclobenzaprine  5 MG tablet Commonly known as: FLEXERIL  Take 1 tablet (5 mg total) by mouth 2 (two) times daily as needed for muscle spasms. Started by: Lemon Sternberg M Rose Hippler   dicyclomine 10 MG capsule Commonly known as: BENTYL TAKE 1 CAPSULE BY MOUTH EVERY 8 HOURS AS NEEDED FOR ABDOMINAL PAIN   DULoxetine 60 MG capsule Commonly known as: CYMBALTA Take 60 mg by mouth 2 (two) times daily.   esomeprazole  40 MG capsule Commonly known as: NexIUM  Take 1 capsule (40 mg total) by mouth 2 (two) times daily before a meal.   famotidine  20 MG tablet Commonly known as: PEPCID  Take 1 tablet (20 mg total) by mouth 2 (two) times daily.   fluticasone  50 MCG/ACT nasal spray Commonly known as: FLONASE  Place 1 spray into both nostrils daily.   gabapentin  600 MG tablet Commonly known as: NEURONTIN  Take 600 mg by mouth 2 (two) times daily.   hydrOXYzine  25 MG tablet Commonly known as: ATARAX  Take 50 mg by mouth every 8 (eight) hours as needed for itching.   Klor-Con  M10 10 MEQ tablet Generic drug: potassium chloride  Take 10 mEq by mouth daily.   potassium chloride  SA 20 MEQ tablet Commonly known as: KLOR-CON  M Take 1 tablet (20 mEq total) by mouth 2 (two) times daily.   levothyroxine  200  MCG tablet Commonly known as: SYNTHROID  PLEASE SEE ATTACHED FOR DETAILED DIRECTIONS   levothyroxine  25 MCG tablet Commonly known as: SYNTHROID  Take 1 tablet (25 mcg total) by mouth daily before breakfast. Along with 200mg  dose to equal 225mcg daily in the AM   liothyronine 5 MCG tablet Commonly known as: CYTOMEL Take 10 mcg by mouth daily.   lisinopril  10 MG tablet Commonly known as: ZESTRIL  Take 1 tablet (10 mg total) by mouth daily.   methocarbamol  500 MG tablet Commonly known as: ROBAXIN  Take 1 tablet (500 mg total) by mouth 3 (three) times daily.   metoprolol tartrate 50 MG tablet Commonly known as: LOPRESSOR Take 50 mg by mouth daily in the afternoon.   nitrofurantoin  (macrocrystal-monohydrate) 100 MG capsule Commonly known as: MACROBID  Take 1 capsule (100 mg total) by mouth 2 (two) times daily.   nortriptyline 10 MG capsule Commonly known as: PAMELOR Take 10 mg in the morning   nystatin  powder Commonly known as: nystatin  Apply 1 Application topically 3 (three) times daily.   predniSONE  20 MG tablet Commonly known as: DELTASONE  Take 2 tablets (40 mg total) by mouth daily with breakfast. Started by: ALAN CHRISTELLA ARRANT   Repatha SureClick 140 MG/ML Soaj Generic drug: Evolocumab Inject into the skin.   rosuvastatin 40 MG tablet Commonly known as: CRESTOR Take 40 mg by mouth daily.   spironolactone 25 MG tablet Commonly known as: ALDACTONE TAKE 1/2 (HALF) TABLET BY MOUTH ONCE DAILY.   Suboxone 8-2 MG Film Generic drug: Buprenorphine HCl-Naloxone HCl Dissolve 1 film under tongue twice daily   sucralfate  1 g tablet Commonly known as: Carafate  Take 1 tablet (1 g total) by mouth every 8 (eight) hours as needed. Take 1 tab every 8 hours (3 times daily) with meals.   triamcinolone  cream 0.1 % Commonly known as: KENALOG  Apply 1 Application  topically 2 (two) times daily.            Assessment & Plan Acute cystitis with hematuria UA in office today  abnormal.  Sending for UA/UC.  Also sending antibiotic.   Will call with results when available. Acute pain of left shoulder Sending prednisone  and a different muscle relaxer.  Will let me know if this is not helping or if this does not improve.     Return if symptoms worsen or fail to improve.  Total time spent: 20 minutes  ALAN CHRISTELLA ARRANT, FNP  02/19/2024   This document may have been prepared by [facility] Voice Recognition software and as such may include unintentional dictation errors.

## 2024-02-21 LAB — URINE CULTURE

## 2024-03-03 DIAGNOSIS — I83009 Varicose veins of unspecified lower extremity with ulcer of unspecified site: Secondary | ICD-10-CM | POA: Diagnosis not present

## 2024-03-03 DIAGNOSIS — M542 Cervicalgia: Secondary | ICD-10-CM | POA: Diagnosis not present

## 2024-03-03 DIAGNOSIS — M25512 Pain in left shoulder: Secondary | ICD-10-CM | POA: Diagnosis not present

## 2024-03-03 DIAGNOSIS — L97909 Non-pressure chronic ulcer of unspecified part of unspecified lower leg with unspecified severity: Secondary | ICD-10-CM | POA: Diagnosis not present

## 2024-03-03 DIAGNOSIS — M67912 Unspecified disorder of synovium and tendon, left shoulder: Secondary | ICD-10-CM | POA: Diagnosis not present

## 2024-03-09 DIAGNOSIS — S83232A Complex tear of medial meniscus, current injury, left knee, initial encounter: Secondary | ICD-10-CM | POA: Diagnosis not present

## 2024-03-11 DIAGNOSIS — M25512 Pain in left shoulder: Secondary | ICD-10-CM | POA: Diagnosis not present

## 2024-03-17 DIAGNOSIS — H2513 Age-related nuclear cataract, bilateral: Secondary | ICD-10-CM | POA: Diagnosis not present

## 2024-03-17 DIAGNOSIS — H43813 Vitreous degeneration, bilateral: Secondary | ICD-10-CM | POA: Diagnosis not present

## 2024-03-23 DIAGNOSIS — M25512 Pain in left shoulder: Secondary | ICD-10-CM | POA: Diagnosis not present

## 2024-03-26 DIAGNOSIS — M25512 Pain in left shoulder: Secondary | ICD-10-CM | POA: Diagnosis not present

## 2024-03-30 DIAGNOSIS — M25512 Pain in left shoulder: Secondary | ICD-10-CM | POA: Diagnosis not present

## 2024-03-31 DIAGNOSIS — M1712 Unilateral primary osteoarthritis, left knee: Secondary | ICD-10-CM | POA: Diagnosis not present

## 2024-03-31 DIAGNOSIS — S83232A Complex tear of medial meniscus, current injury, left knee, initial encounter: Secondary | ICD-10-CM | POA: Diagnosis not present

## 2024-03-31 DIAGNOSIS — Z86718 Personal history of other venous thrombosis and embolism: Secondary | ICD-10-CM | POA: Diagnosis not present

## 2024-04-06 ENCOUNTER — Encounter (INDEPENDENT_AMBULATORY_CARE_PROVIDER_SITE_OTHER): Payer: Self-pay | Admitting: Nurse Practitioner

## 2024-04-06 ENCOUNTER — Ambulatory Visit (INDEPENDENT_AMBULATORY_CARE_PROVIDER_SITE_OTHER): Admitting: Nurse Practitioner

## 2024-04-06 VITALS — BP 93/63 | HR 76 | Wt 242.0 lb

## 2024-04-06 DIAGNOSIS — M25512 Pain in left shoulder: Secondary | ICD-10-CM | POA: Diagnosis not present

## 2024-04-06 DIAGNOSIS — I824Y9 Acute embolism and thrombosis of unspecified deep veins of unspecified proximal lower extremity: Secondary | ICD-10-CM

## 2024-04-07 ENCOUNTER — Encounter (INDEPENDENT_AMBULATORY_CARE_PROVIDER_SITE_OTHER): Payer: Self-pay | Admitting: Nurse Practitioner

## 2024-04-07 NOTE — Progress Notes (Signed)
 Subjective:    Patient ID: Alexis Christensen, female    DOB: 01/09/1971, 53 y.o.   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  DVT was identified years ago and was treated with anticoagulation.  The presenting symptoms were pain and swelling in the lower extremity. He has been having significant knee pain following injuries years ago and at this point her orthopedic surgeon has recommended placement of an IVC filter given her previous history of right lower extremity DVT years ago prior to surgery for her knee. The patient has been using compression therapy at this point.  No SOB or pleuritic chest pains.  No cough or hemoptysis.  The patient is currently not on anticoagulation    Review of Systems  Musculoskeletal:  Positive for arthralgias.  All other systems reviewed and are negative.      Objective:   Physical Exam Vitals reviewed.  HENT:     Head: Normocephalic.  Cardiovascular:     Rate and Rhythm: Normal rate.  Pulmonary:     Effort: Pulmonary effort is normal.  Skin:    General: Skin is warm and dry.  Neurological:     Mental Status: She is alert and oriented to person, place, and time.  Psychiatric:        Mood and Affect: Mood normal.        Behavior: Behavior normal.        Thought Content: Thought content normal.        Judgment: Judgment normal.     BP 93/63   Pulse 76   Wt 242 lb (109.8 kg)   BMI 41.54 kg/m   Past Medical History:  Diagnosis Date   Acute cough 02/12/2023   Acute deep vein thrombosis (DVT) of right lower extremity (HCC) 2016   Acute non-recurrent maxillary sinusitis 07/16/2023   ADHD    Adopted    ANA positive    Anemia    Anxiety    Arthritis    Asthma    B12 deficiency    Bilateral hand pain    Bilateral swelling of feet and ankles    Bipolar  1 disorder (HCC)    Carpal tunnel syndrome of left wrist    Chest pain    Chronic diarrhea 09/07/2015   Chronic diastolic (congestive) heart failure (HCC)    Chronic left shoulder pain    Chronic venous insufficiency 04/26/2016   Clostridioides difficile infection 04/06/2019   Deep venous thrombosis of lower extremity (HCC) 03/04/2015   Depression    Diverticulosis    Edema leg    Effusion of knee 12/30/2013   GERD (gastroesophageal reflux disease)    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 pylori) infection 08/24/2015   Hiatal hernia    History of 2019 novel coronavirus disease (COVID-19) 09/09/2020   a.) PCR testing (+): 09/09/2020, 01/12/2021, 07/17/2021   HLD (hyperlipidemia)    Hx MRSA infection    Hypothyroidism    Influenza A 10/01/2022   Knee effusion    Knee pain    Left wrist sprain, sequela 01/14/2023   Lipoma of arm 05/11/2013   Lower extremity edema    Lumbago    Lymphedema 04/26/2016   Migraine with aura    Multinodular goiter  Nephrolithiasis    Plantar fascial fibromatosis    Post traumatic stress disorder (PTSD)    Pre-diabetes    PVD (peripheral vascular disease)    Redness of eye, left 08/22/2016   Overview:   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  Seen at [facility]   Mild pre septal cellulitis /CT orbit FEB, 2018     Seasonal allergies    Shortness of breath on exertion    Sore throat 02/12/2023   Stress incontinence    Swelling of limb 04/26/2016   Urge incontinence    Urinary tract infection without hematuria 11/26/2022   Venous insufficiency    Venous ulcer (HCC)    Vitamin D  deficiency    Weight loss, unintentional 09/07/2015   Wound infection, posttraumatic 01/14/2023    Social History   Socioeconomic History   Marital status: Divorced    Spouse name: Not on file   Number of children: Not on file   Years of education: Not on file   Highest education level:  Not on file  Occupational History   Occupation: Customer Service    Employer: [employer]  Tobacco Use   Smoking status: Former    Current packs/day: 0.00    Average packs/day: 0.5 packs/day for 11.0 years (5.5 ttl pk-yrs)    Types: Cigarettes    Start date: 07/16/2008    Quit date: 07/16/2019    Years since quitting: 4.7    Passive exposure: Past   Smokeless tobacco: Former   Tobacco comments:    STRESS RELATED  Vaping Use   Vaping status: Never Used  Substance and Sexual Activity   Alcohol use: Not Currently    Comment: Hisotry of ETOH abouse ASB patient   Drug use: Not Currently    Comment: Pt listed history of abuse of prescription drugs   Sexual activity: Not Currently  Other Topics Concern   Not on file  Social History Narrative   Not on file   Social Drivers of Health   Financial Resource Strain: Low Risk  (03/03/2024)   Received from [facility]   Overall Financial Resource Strain (CARDIA)    Difficulty of Paying Living Expenses: Not hard at all  Food Insecurity: No Food Insecurity (03/03/2024)   Received from [facility]   Hunger Vital Sign    Within the past 12 months, you worried that your food would run out before you got the money to buy more.: Never true    Within the past 12 months, the food you bought just didn't last and you didn't have money to get more.: Never true  Transportation Needs: No Transportation Needs (03/03/2024)   Received from [facility] - Transportation    In the past 12 months, has lack of transportation kept you from medical appointments or from getting medications?: No    Lack of Transportation (Non-Medical): No  Physical Activity: Not on file  Stress: Not on file  Social Connections: Not on file  Intimate Partner Violence: Not on file    Past Surgical History:  Procedure Laterality Date   ABDOMINAL HYSTERECTOMY     ANKLE SURGERY     CHOLECYSTECTOMY     COLONOSCOPY  WITH PROPOFOL  N/A 06/24/2019   Procedure: COLONOSCOPY WITH PROPOFOL ;  Surgeon: Toledo, Ladell POUR, MD;  Location: [facility];  Service: Gastroenterology;  Laterality: N/A;   CYSTOSCOPY N/A 04/09/2016   Procedure: CYSTOSCOPY;  Surgeon: Glendia Elizabeth, MD;  Location:  [facility];  Service: Urology;  Laterality: N/A;   DILATION AND CURETTAGE OF UTERUS     ESOPHAGOGASTRODUODENOSCOPY (EGD) WITH PROPOFOL  N/A 06/24/2019   Procedure: ESOPHAGOGASTRODUODENOSCOPY (EGD) WITH PROPOFOL ;  Surgeon: Toledo, Ladell POUR, MD;  Location: [facility];  Service: Gastroenterology;  Laterality: N/A;   ESOPHAGOGASTRODUODENOSCOPY (EGD) WITH PROPOFOL  N/A 10/01/2023   Procedure: ESOPHAGOGASTRODUODENOSCOPY (EGD) WITH PROPOFOL ;  Surgeon: Therisa Bi, MD;  Location: [facility];  Service: Gastroenterology;  Laterality: N/A;   INGUINAL HERNIA REPAIR Bilateral 04/18/2017   Procedure: LAPAROSCOPIC BILATERAL INGUINAL HERNIA REPAIR;  Surgeon: Jordis Laneta FALCON, MD;  Location: [facility];  Service: General;  Laterality: Bilateral;   INTERSTIM IMPLANT PLACEMENT N/A 11/26/2016   Procedure: RENNA IMPLANT FIRST STAGE;  Surgeon: Gaston Hamilton, MD;  Location: [facility];  Service: Urology;  Laterality: N/A;   INTERSTIM IMPLANT PLACEMENT N/A 11/26/2016   Procedure: RENNA IMPLANT SECOND STAGE;  Surgeon: Gaston Hamilton, MD;  Location: [facility];  Service: Urology;  Laterality: N/A;   INTERSTIM IMPLANT PLACEMENT N/A 02/18/2023   Procedure: RENNA IMPLANT FIRST STAGE WITH IMPEDANCE CHECK;  Surgeon: Gaston Hamilton, MD;  Location: [facility];  Service: Urology;  Laterality: N/A;   INTERSTIM IMPLANT PLACEMENT N/A 02/18/2023   Procedure: RENNA IMPLANT SECOND STAGE;  Surgeon: Gaston Hamilton, MD;  Location: [facility];  Service: Urology;  Laterality: N/A;   INTERSTIM IMPLANT REMOVAL N/A 02/18/2023   Procedure: REMOVAL OF INTERSTIM IMPLANT;  Surgeon: Gaston Hamilton, MD;  Location: [facility];  Service: Urology;  Laterality: N/A;   JOINT  REPLACEMENT Right    knee   KNEE ARTHROSCOPY Bilateral    KNEE ARTHROSCOPY WITH LATERAL RELEASE Left 10/18/2015   Procedure: KNEE ARTHROSCOPY LATERAL AND PARTIAL SYNOVECTOMY;  Surgeon: Ozell Flake, MD;  Location: [facility];  Service: Orthopedics;  Laterality: Left;   Lymph Node removal  2015   Neck   PUBOVAGINAL SLING N/A 04/09/2016   Procedure: PUBO-VAGINAL SLING/ RETROPUBIC SLING;  Surgeon: Hamilton Gaston, MD;  Location: [facility];  Service: Urology;  Laterality: N/A;   REPLACEMENT TOTAL KNEE Right    SHOULDER SURGERY Right 2014   TUBAL LIGATION     WRIST SURGERY Right    metal plate    Family History  Adopted: Yes  Family history unknown: Yes    Allergies  Allergen Reactions   Geodon [Ziprasidone Hydrochloride] Other (See Comments)    Numbness ,sob, headaches, blurred vision   Sulfacetamide Sodium Hives   Ziprasidone Anaphylaxis    Other reaction(s): Other (See Comments) Numbness ,sob, headaches, blurred vision  Other reaction(s): Other (See Comments), Unknown Numbness ,sob, headaches, blurred vision Numbness ,sob, headaches, blurred vision  Other reaction(s): Other (See Comments), Unknown Numbness ,sob, headaches, blurred vision Numbness ,sob, headaches, blurred vision   Numbness ,sob, headaches, blurred vision Other reaction(s): Other (See Comments), Unknown Numbness ,sob, headaches, blurred vision Numbness ,sob, headaches, blurred vision Other reaction(s): Other (See Comments), Unknown Numbness ,sob, headaches, blurred vision Numbness ,sob, headaches, blurred vision   Ziprasidone Hcl Anaphylaxis and Other (See Comments)    Other reaction(s): Other (See Comments), Unknown Numbness ,sob, headaches, blurred vision Numbness ,sob, headaches, blurred vision   Ace Inhibitors Rash   Erythromycin Rash and Hives   Hydromorphone Rash   Lamictal [Lamotrigine] Rash    Other reaction(s): Unknown   Other Rash    rash   Strawberry (Diagnostic) Rash   Sulfa Antibiotics  Hives and Rash    Other reaction(s): Unknown  Other reaction(s): Unknown Other reaction(s): Unknown Other reaction(s): Unknown  Latest Ref Rng & Units 09/27/2023    9:39 AM 03/01/2023    9:38 PM 02/18/2023   12:01 PM  CBC  WBC 3.4 - 10.8 x10E3/uL 6.7  7.1    Hemoglobin 11.1 - 15.9 g/dL 88.8  88.2  88.0   Hematocrit 34.0 - 46.6 % 35.1  37.4  35.0   Platelets 150 - 450 x10E3/uL 300  365        CMP     Component Value Date/Time   NA 143 09/27/2023 0939   NA 138 11/07/2012 0517   K 4.0 09/27/2023 0939   K 3.9 11/07/2012 0517   CL 104 09/27/2023 0939   CL 109 (H) 11/07/2012 0517   CO2 28 09/27/2023 0939   CO2 25 11/07/2012 0517   GLUCOSE 106 (H) 09/27/2023 0939   GLUCOSE 118 (H) 03/01/2023 2138   GLUCOSE 91 11/07/2012 0517   BUN 12 09/27/2023 0939   BUN 7 11/07/2012 0517   CREATININE 0.74 09/27/2023 0939   CREATININE 0.76 11/07/2012 0517   CALCIUM 8.7 09/27/2023 0939   CALCIUM 7.9 (L) 11/07/2012 0517   PROT 6.4 09/27/2023 0939   ALBUMIN 3.9 09/27/2023 0939   AST 19 09/27/2023 0939   ALT 14 09/27/2023 0939   ALKPHOS 109 09/27/2023 0939   BILITOT 0.3 09/27/2023 0939   EGFR 96 09/27/2023 0939   GFRNONAA >60 03/01/2023 2138   GFRNONAA >60 11/07/2012 0517     No results found.     Assessment & Plan:   1. Deep vein thrombosis (DVT) of proximal lower extremity, unspecified chronicity, unspecified laterality (HCC) (Primary) The patient will continue anticoagulation for now as there have not been any problems or complications at this point.  IVC filter is strongly indicated prior to high risk orthopedic surgery.  Especially given the history of PE / DVT.  IVC filter placement will be done the week for surgery. Risk and benefits were reviewed the patient.  Indications for the procedure were reviewed.  All questions were answered, the patient agrees to proceed.   Elevation was stressed, such as the use of a recliner.  I have reviewed with the patient DVT and post  phlebitic changes such as swelling and why it  causes symptoms such as pain.  I recommended to the patient to wear graduated compression stockings, beginning after three full days of anticoagulation.  Graduated compression should be worn on a daily basis. The patient should wear compression beginning first thing in the morning and removing them in the evening. The patient is instructed specifically not to sleep in the stockings.  In addition, behavioral modification including elevation during the day and avoidance of prolonged dependency will be initiated.    The patient will follow-up with me 6-8 weeks after the joint replacement surgery to discuss removal (this was also discussed today and the patient agrees with the plan to have the filter removed).    Current Outpatient Medications on File Prior to Visit  Medication Sig Dispense Refill   albuterol  (VENTOLIN  HFA) 108 (90 Base) MCG/ACT inhaler Inhale 1 puff into the lungs every 6 (six) hours as needed for wheezing or shortness of breath.     ARIPiprazole (ABILIFY) 30 MG tablet Take 30 mg by mouth daily.     aspirin 81 MG chewable tablet Aspirin 81 MG Oral Tablet Chewable QTY: 30 tablet Days: 30 Refills: 0  Written: 01/23/21 Patient Instructions: once a day     Budeson-Glycopyrrol-Formoterol (BREZTRI AEROSPHERE) 160-9-4.8 MCG/ACT AERO Inhale 2 puffs into the  lungs in the morning and at bedtime.     celecoxib (CELEBREX) 100 MG capsule Take 100 mg by mouth 2 (two) times daily.     colestipol  (COLESTID ) 1 g tablet TAKE 2 TABLETS(2 GRAMS) BY MOUTH TWICE DAILY 120 tablet 5   colestipol  (COLESTID ) 1 g tablet TAKE 2 TABLETS(2 GRAMS) BY MOUTH TWICE DAILY 120 tablet 5   cyclobenzaprine  (FLEXERIL ) 5 MG tablet Take 1 tablet (5 mg total) by mouth 2 (two) times daily as needed for muscle spasms. 30 tablet 0   dicyclomine (BENTYL) 10 MG capsule TAKE 1 CAPSULE BY MOUTH EVERY 8 HOURS AS NEEDED FOR ABDOMINAL PAIN 270 capsule 3   DULoxetine (CYMBALTA) 60 MG capsule  Take 60 mg by mouth 2 (two) times daily.     esomeprazole  (NEXIUM ) 40 MG capsule Take 1 capsule (40 mg total) by mouth 2 (two) times daily before a meal. 180 capsule 1   gabapentin  (NEURONTIN ) 600 MG tablet Take 600 mg by mouth 2 (two) times daily.      hydrOXYzine  (ATARAX /VISTARIL ) 25 MG tablet Take 50 mg by mouth every 8 (eight) hours as needed for itching.     levothyroxine  (SYNTHROID ) 200 MCG tablet PLEASE SEE ATTACHED FOR DETAILED DIRECTIONS     levothyroxine  (SYNTHROID ) 25 MCG tablet Take 1 tablet (25 mcg total) by mouth daily before breakfast. Along with 200mg  dose to equal 225mcg daily in the AM 90 tablet 0   liothyronine (CYTOMEL) 5 MCG tablet Take 10 mcg by mouth daily.     lisinopril  (ZESTRIL ) 10 MG tablet Take 1 tablet (10 mg total) by mouth daily. 30 tablet 11   metoprolol tartrate (LOPRESSOR) 50 MG tablet Take 50 mg by mouth daily in the afternoon.     nitrofurantoin , macrocrystal-monohydrate, (MACROBID ) 100 MG capsule Take 1 capsule (100 mg total) by mouth 2 (two) times daily. 14 capsule 0   nortriptyline (PAMELOR) 10 MG capsule Take 10 mg in the morning     nystatin  powder Apply 1 Application topically 3 (three) times daily. 15 g 3   potassium chloride  SA (KLOR-CON  M) 20 MEQ tablet Take 1 tablet (20 mEq total) by mouth 2 (two) times daily. 6 tablet 0   rosuvastatin (CRESTOR) 40 MG tablet Take 40 mg by mouth daily.     spironolactone (ALDACTONE) 25 MG tablet TAKE 1/2 (HALF) TABLET BY MOUTH ONCE DAILY. 45 tablet 1   SUBOXONE 8-2 MG FILM Dissolve 1 film under tongue twice daily  0   triamcinolone  cream (KENALOG ) 0.1 % Apply 1 Application topically 2 (two) times daily. 453 g 3   Evolocumab (REPATHA SURECLICK) 140 MG/ML SOAJ Inject into the skin. (Patient not taking: Reported on 04/06/2024)     famotidine  (PEPCID ) 20 MG tablet Take 1 tablet (20 mg total) by mouth 2 (two) times daily. (Patient not taking: Reported on 04/06/2024) 180 tablet 1   fluticasone  (FLONASE ) 50 MCG/ACT nasal spray  Place 1 spray into both nostrils daily. (Patient not taking: Reported on 04/06/2024) 15.8 mL 2   KLOR-CON  M10 10 MEQ tablet Take 10 mEq by mouth daily. (Patient not taking: Reported on 04/06/2024)     methocarbamol  (ROBAXIN ) 500 MG tablet Take 1 tablet (500 mg total) by mouth 3 (three) times daily. (Patient not taking: Reported on 04/06/2024) 30 tablet 0   predniSONE  (DELTASONE ) 20 MG tablet Take 2 tablets (40 mg total) by mouth daily with breakfast. (Patient not taking: Reported on 04/06/2024) 10 tablet 0   sucralfate  (CARAFATE ) 1 g tablet Take 1  tablet (1 g total) by mouth every 8 (eight) hours as needed. Take 1 tab every 8 hours (3 times daily) with meals. (Patient not taking: Reported on 04/06/2024) 90 tablet 0   No current facility-administered medications on file prior to visit.    There are no Patient Instructions on file for this visit. No follow-ups on file.   Majesta Leichter E Beadie Matsunaga, NP

## 2024-04-08 DIAGNOSIS — M25512 Pain in left shoulder: Secondary | ICD-10-CM | POA: Diagnosis not present

## 2024-04-13 ENCOUNTER — Ambulatory Visit: Payer: Self-pay

## 2024-04-15 ENCOUNTER — Telehealth (INDEPENDENT_AMBULATORY_CARE_PROVIDER_SITE_OTHER): Payer: Self-pay

## 2024-04-15 ENCOUNTER — Encounter (INDEPENDENT_AMBULATORY_CARE_PROVIDER_SITE_OTHER): Payer: Self-pay

## 2024-04-15 DIAGNOSIS — M25512 Pain in left shoulder: Secondary | ICD-10-CM | POA: Diagnosis not present

## 2024-04-15 NOTE — Telephone Encounter (Signed)
 I attempted to contact the patient to give her the information regarding being scheduled for a IVC filter placement. Patient left a message stating she was have surgery on 05/28/24 and needed to be scheduled for a filter. Patient is scheduled for 05/19/24 with a 6:45 am arrival time to the [facility]. Pre-procedure instructions will be sent to Mychart and mailed. Patient called back and pre-procedure instructions were discussed.

## 2024-04-22 ENCOUNTER — Ambulatory Visit: Admitting: Surgery

## 2024-04-22 DIAGNOSIS — G8929 Other chronic pain: Secondary | ICD-10-CM | POA: Diagnosis not present

## 2024-04-22 DIAGNOSIS — Z79899 Other long term (current) drug therapy: Secondary | ICD-10-CM | POA: Diagnosis not present

## 2024-04-30 ENCOUNTER — Ambulatory Visit (INDEPENDENT_AMBULATORY_CARE_PROVIDER_SITE_OTHER): Admitting: Cardiology

## 2024-04-30 ENCOUNTER — Encounter: Payer: Self-pay | Admitting: Cardiology

## 2024-04-30 VITALS — BP 124/68 | HR 72 | Ht 64.0 in | Wt 249.0 lb

## 2024-04-30 DIAGNOSIS — J0110 Acute frontal sinusitis, unspecified: Secondary | ICD-10-CM

## 2024-04-30 MED ORDER — BENZONATATE 100 MG PO CAPS: 100.0000 mg | ORAL_CAPSULE | Freq: Three times a day (TID) | ORAL | 1 refills | Status: DC | PRN

## 2024-04-30 MED ORDER — METHYLPREDNISOLONE 4 MG PO TBPK: ORAL_TABLET | ORAL | 0 refills | Status: DC

## 2024-04-30 MED ORDER — AMOXICILLIN-POT CLAVULANATE 875-125 MG PO TABS
1.0000 | ORAL_TABLET | Freq: Two times a day (BID) | ORAL | 0 refills | Status: AC
Start: 2024-04-30 — End: 2024-05-07

## 2024-04-30 NOTE — Progress Notes (Signed)
 Established Patient Office Visit  Subjective:  Patient ID: Alexis Christensen, female    DOB: Mar 15, 1971  Age: 53 y.o. MRN: [id number]  Chief Complaint  Patient presents with   Acute Visit    Severe Congestion  and Cough X 2 Weeks    Patient in office for an acute visit, complaining of severe congestion and cough for 2 weeks.  Complains of ear pain, headaches, sinus pain, nasal congestion, PND. Has tried Mucinex and Robitussin with no relief.  Will send in Medrol  dose pack, Augmentin , Tessalon  pearls. Drink plenty of water.   Cough This is a new problem. The current episode started 1 to 4 weeks ago. The problem has been unchanged. The cough is Productive of purulent sputum. Associated symptoms include ear pain, headaches, nasal congestion and postnasal drip. Pertinent negatives include no chest pain, myalgias, rhinorrhea, sore throat or shortness of breath. The symptoms are aggravated by lying down. Treatments tried: Mucinex, Robitussin. The treatment provided no relief.    No other concerns at this time.   Past Medical History:  Diagnosis Date   Acute cough 02/12/2023   Acute deep vein thrombosis (DVT) of right lower extremity (HCC) 2016   Acute non-recurrent maxillary sinusitis 07/16/2023   ADHD    Adopted    ANA positive    Anemia    Anxiety    Arthritis    Asthma    B12 deficiency    Bilateral hand pain    Bilateral swelling of feet and ankles    Bipolar 1 disorder (HCC)    Carpal tunnel syndrome of left wrist    Chest pain    Chronic diarrhea 09/07/2015   Chronic diastolic (congestive) heart failure (HCC)    Chronic left shoulder pain    Chronic venous insufficiency 04/26/2016   Clostridioides difficile infection 04/06/2019   Deep venous thrombosis of lower extremity (HCC) 03/04/2015   Depression    Diverticulosis    Edema leg    Effusion of knee 12/30/2013   GERD (gastroesophageal reflux disease)    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 pylori) infection 08/24/2015   Hiatal hernia    History of 2019 novel coronavirus disease (COVID-19) 09/09/2020   a.) PCR testing (+): 09/09/2020, 01/12/2021, 07/17/2021   HLD (hyperlipidemia)    Hx MRSA infection    Hypothyroidism    Influenza A 10/01/2022   Knee effusion    Knee pain    Left wrist sprain, sequela 01/14/2023   Lipoma of arm 05/11/2013   Lower extremity edema    Lumbago    Lymphedema 04/26/2016   Migraine with aura    Multinodular goiter    Nephrolithiasis    Plantar fascial fibromatosis    Post traumatic stress disorder (PTSD)    Pre-diabetes    PVD (peripheral vascular disease)    Redness of eye, left 08/22/2016   Overview:   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  Seen at  [facility]   Mild pre septal cellulitis /CT orbit FEB, 2018     Seasonal allergies    Shortness of breath on exertion    Sore throat 02/12/2023   Stress incontinence    Swelling of limb 04/26/2016   Urge incontinence    Urinary tract infection without hematuria 11/26/2022   Venous insufficiency    Venous ulcer (HCC)    Vitamin D  deficiency    Weight loss, unintentional 09/07/2015   Wound infection,  posttraumatic 01/14/2023    Past Surgical History:  Procedure Laterality Date   ABDOMINAL HYSTERECTOMY     ANKLE SURGERY     CHOLECYSTECTOMY     COLONOSCOPY WITH PROPOFOL  N/A 06/24/2019   Procedure: COLONOSCOPY WITH PROPOFOL ;  Surgeon: Toledo, Ladell POUR, MD;  Location: [facility];  Service: Gastroenterology;  Laterality: N/A;   CYSTOSCOPY N/A 04/09/2016   Procedure: CYSTOSCOPY;  Surgeon: Glendia Elizabeth, MD;  Location: [facility];  Service: Urology;  Laterality: N/A;   DILATION AND CURETTAGE OF UTERUS     ESOPHAGOGASTRODUODENOSCOPY (EGD) WITH PROPOFOL  N/A 06/24/2019   Procedure: ESOPHAGOGASTRODUODENOSCOPY (EGD) WITH PROPOFOL ;  Surgeon: Toledo, Ladell POUR, MD;  Location: [facility];  Service: Gastroenterology;  Laterality: N/A;    ESOPHAGOGASTRODUODENOSCOPY (EGD) WITH PROPOFOL  N/A 10/01/2023   Procedure: ESOPHAGOGASTRODUODENOSCOPY (EGD) WITH PROPOFOL ;  Surgeon: Therisa Bi, MD;  Location: [facility];  Service: Gastroenterology;  Laterality: N/A;   INGUINAL HERNIA REPAIR Bilateral 04/18/2017   Procedure: LAPAROSCOPIC BILATERAL INGUINAL HERNIA REPAIR;  Surgeon: Jordis Laneta FALCON, MD;  Location: [facility];  Service: General;  Laterality: Bilateral;   INTERSTIM IMPLANT PLACEMENT N/A 11/26/2016   Procedure: RENNA IMPLANT FIRST STAGE;  Surgeon: Elizabeth Glendia, MD;  Location: [facility];  Service: Urology;  Laterality: N/A;   INTERSTIM IMPLANT PLACEMENT N/A 11/26/2016   Procedure: RENNA IMPLANT SECOND STAGE;  Surgeon: Elizabeth Glendia, MD;  Location: [facility];  Service: Urology;  Laterality: N/A;   INTERSTIM IMPLANT PLACEMENT N/A 02/18/2023   Procedure: RENNA IMPLANT FIRST STAGE WITH IMPEDANCE CHECK;  Surgeon: Elizabeth Glendia, MD;  Location: [facility];  Service: Urology;  Laterality: N/A;   INTERSTIM IMPLANT PLACEMENT N/A 02/18/2023   Procedure: RENNA IMPLANT SECOND STAGE;  Surgeon: Elizabeth Glendia, MD;  Location: [facility];  Service: Urology;  Laterality: N/A;   INTERSTIM IMPLANT REMOVAL N/A 02/18/2023   Procedure: REMOVAL OF INTERSTIM IMPLANT;  Surgeon: Elizabeth Glendia, MD;  Location: [facility];  Service: Urology;  Laterality: N/A;   JOINT REPLACEMENT Right    knee   KNEE ARTHROSCOPY Bilateral    KNEE ARTHROSCOPY WITH LATERAL RELEASE Left 10/18/2015   Procedure: KNEE ARTHROSCOPY LATERAL AND PARTIAL SYNOVECTOMY;  Surgeon: Ozell Flake, MD;  Location: [facility];  Service: Orthopedics;  Laterality: Left;   Lymph Node removal  2015   Neck   PUBOVAGINAL SLING N/A 04/09/2016   Procedure: PUBO-VAGINAL SLING/ RETROPUBIC SLING;  Surgeon: Glendia Elizabeth, MD;  Location: [facility];  Service: Urology;  Laterality: N/A;   REPLACEMENT TOTAL KNEE Right    SHOULDER SURGERY Right 2014   TUBAL LIGATION     WRIST SURGERY Right     metal plate    Social History   Socioeconomic History   Marital status: Divorced    Spouse name: Not on file   Number of children: Not on file   Years of education: Not on file   Highest education level: Not on file  Occupational History   Occupation: Customer Service    Employer: [employer]  Tobacco Use   Smoking status: Former    Current packs/day: 0.00    Average packs/day: 0.5 packs/day for 11.0 years (5.5 ttl pk-yrs)    Types: Cigarettes    Start date: 07/16/2008    Quit date: 07/16/2019    Years since quitting: 4.7    Passive exposure: Past   Smokeless tobacco: Former   Tobacco comments:    STRESS RELATED  Vaping Use   Vaping status: Never Used  Substance and Sexual Activity   Alcohol use: Not Currently  Comment: Hisotry of ETOH abouse ASB patient   Drug use: Not Currently    Comment: Pt listed history of abuse of prescription drugs   Sexual activity: Not Currently  Other Topics Concern   Not on file  Social History Narrative   Not on file   Social Drivers of Health   Financial Resource Strain: Low Risk  (03/03/2024)   Received from [facility]   Overall Financial Resource Strain (CARDIA)    Difficulty of Paying Living Expenses: Not hard at all  Food Insecurity: No Food Insecurity (03/03/2024)   Received from [facility]   Hunger Vital Sign    Within the past 12 months, you worried that your food would run out before you got the money to buy more.: Never true    Within the past 12 months, the food you bought just didn't last and you didn't have money to get more.: Never true  Transportation Needs: No Transportation Needs (03/03/2024)   Received from [facility] - Transportation    In the past 12 months, has lack of transportation kept you from medical appointments or from getting medications?: No    Lack of Transportation (Non-Medical): No  Physical Activity: Not on file  Stress: Not on file   Social Connections: Not on file  Intimate Partner Violence: Not on file    Family History  Adopted: Yes  Family history unknown: Yes    Allergies  Allergen Reactions   Geodon [Ziprasidone Hydrochloride] Other (See Comments)    Numbness ,sob, headaches, blurred vision   Sulfacetamide Sodium Hives   Ziprasidone Anaphylaxis    Other reaction(s): Other (See Comments) Numbness ,sob, headaches, blurred vision  Other reaction(s): Other (See Comments), Unknown Numbness ,sob, headaches, blurred vision Numbness ,sob, headaches, blurred vision  Other reaction(s): Other (See Comments), Unknown Numbness ,sob, headaches, blurred vision Numbness ,sob, headaches, blurred vision   Numbness ,sob, headaches, blurred vision Other reaction(s): Other (See Comments), Unknown Numbness ,sob, headaches, blurred vision Numbness ,sob, headaches, blurred vision Other reaction(s): Other (See Comments), Unknown Numbness ,sob, headaches, blurred vision Numbness ,sob, headaches, blurred vision   Ziprasidone Hcl Anaphylaxis and Other (See Comments)    Other reaction(s): Other (See Comments), Unknown Numbness ,sob, headaches, blurred vision Numbness ,sob, headaches, blurred vision   Ace Inhibitors Rash   Erythromycin Rash and Hives   Hydromorphone Rash   Lamictal [Lamotrigine] Rash    Other reaction(s): Unknown   Other Rash    rash   Strawberry (Diagnostic) Rash   Sulfa Antibiotics Hives and Rash    Other reaction(s): Unknown  Other reaction(s): Unknown Other reaction(s): Unknown Other reaction(s): Unknown    Outpatient Medications Prior to Visit  Medication Sig   albuterol  (VENTOLIN  HFA) 108 (90 Base) MCG/ACT inhaler Inhale 1 puff into the lungs every 6 (six) hours as needed for wheezing or shortness of breath.   ARIPiprazole (ABILIFY) 30 MG tablet Take 30 mg by mouth daily.   aspirin 81 MG chewable tablet Aspirin 81 MG Oral Tablet Chewable QTY: 30 tablet Days: 30 Refills: 0  Written:  01/23/21 Patient Instructions: once a day   Budeson-Glycopyrrol-Formoterol (BREZTRI AEROSPHERE) 160-9-4.8 MCG/ACT AERO Inhale 2 puffs into the lungs in the morning and at bedtime.   celecoxib (CELEBREX) 100 MG capsule Take 100 mg by mouth 2 (two) times daily.   colestipol  (COLESTID ) 1 g tablet TAKE 2 TABLETS(2 GRAMS) BY MOUTH TWICE DAILY   colestipol  (COLESTID ) 1 g tablet TAKE 2 TABLETS(2  GRAMS) BY MOUTH TWICE DAILY   cyclobenzaprine  (FLEXERIL ) 5 MG tablet Take 1 tablet (5 mg total) by mouth 2 (two) times daily as needed for muscle spasms.   dicyclomine (BENTYL) 10 MG capsule TAKE 1 CAPSULE BY MOUTH EVERY 8 HOURS AS NEEDED FOR ABDOMINAL PAIN   DULoxetine (CYMBALTA) 60 MG capsule Take 60 mg by mouth 2 (two) times daily.   esomeprazole  (NEXIUM ) 40 MG capsule Take 1 capsule (40 mg total) by mouth 2 (two) times daily before a meal.   gabapentin  (NEURONTIN ) 600 MG tablet Take 600 mg by mouth 2 (two) times daily.    hydrOXYzine  (ATARAX /VISTARIL ) 25 MG tablet Take 50 mg by mouth every 8 (eight) hours as needed for itching.   levothyroxine  (SYNTHROID ) 200 MCG tablet PLEASE SEE ATTACHED FOR DETAILED DIRECTIONS   levothyroxine  (SYNTHROID ) 25 MCG tablet Take 1 tablet (25 mcg total) by mouth daily before breakfast. Along with 200mg  dose to equal 225mcg daily in the AM   liothyronine (CYTOMEL) 5 MCG tablet Take 10 mcg by mouth daily.   lisinopril  (ZESTRIL ) 10 MG tablet Take 1 tablet (10 mg total) by mouth daily.   metoprolol tartrate (LOPRESSOR) 50 MG tablet Take 50 mg by mouth daily in the afternoon.   nitrofurantoin , macrocrystal-monohydrate, (MACROBID ) 100 MG capsule Take 1 capsule (100 mg total) by mouth 2 (two) times daily.   nortriptyline (PAMELOR) 10 MG capsule Take 10 mg in the morning   nystatin  powder Apply 1 Application topically 3 (three) times daily.   potassium chloride  SA (KLOR-CON  M) 20 MEQ tablet Take 1 tablet (20 mEq total) by mouth 2 (two) times daily.   predniSONE  (DELTASONE ) 20 MG tablet  Take 2 tablets (40 mg total) by mouth daily with breakfast.   rosuvastatin (CRESTOR) 40 MG tablet Take 40 mg by mouth daily.   spironolactone (ALDACTONE) 25 MG tablet TAKE 1/2 (HALF) TABLET BY MOUTH ONCE DAILY.   SUBOXONE 8-2 MG FILM Dissolve 1 film under tongue twice daily   triamcinolone  cream (KENALOG ) 0.1 % Apply 1 Application topically 2 (two) times daily.   [DISCONTINUED] Evolocumab (REPATHA SURECLICK) 140 MG/ML SOAJ Inject into the skin. (Patient not taking: Reported on 04/06/2024)   [DISCONTINUED] famotidine  (PEPCID ) 20 MG tablet Take 1 tablet (20 mg total) by mouth 2 (two) times daily. (Patient not taking: Reported on 04/06/2024)   [DISCONTINUED] fluticasone  (FLONASE ) 50 MCG/ACT nasal spray Place 1 spray into both nostrils daily. (Patient not taking: Reported on 04/06/2024)   [DISCONTINUED] KLOR-CON  M10 10 MEQ tablet Take 10 mEq by mouth daily. (Patient not taking: Reported on 04/06/2024)   [DISCONTINUED] methocarbamol  (ROBAXIN ) 500 MG tablet Take 1 tablet (500 mg total) by mouth 3 (three) times daily. (Patient not taking: Reported on 04/06/2024)   [DISCONTINUED] sucralfate  (CARAFATE ) 1 g tablet Take 1 tablet (1 g total) by mouth every 8 (eight) hours as needed. Take 1 tab every 8 hours (3 times daily) with meals. (Patient not taking: Reported on 04/06/2024)   No facility-administered medications prior to visit.    Review of Systems  Constitutional: Negative.   HENT:  Positive for congestion, ear pain, postnasal drip and sinus pain. Negative for rhinorrhea and sore throat.   Eyes: Negative.   Respiratory:  Positive for cough and sputum production. Negative for shortness of breath.   Cardiovascular: Negative.  Negative for chest pain.  Gastrointestinal: Negative.  Negative for abdominal pain, constipation and diarrhea.  Genitourinary: Negative.   Musculoskeletal:  Negative for joint pain and myalgias.  Skin: Negative.  Neurological:  Positive for headaches. Negative for dizziness.   Endo/Heme/Allergies: Negative.   All other systems reviewed and are negative.      Objective:   BP 124/68   Pulse 72   Ht 5' 4 (1.626 m)   Wt 249 lb (112.9 kg)   SpO2 98%   BMI 42.74 kg/m   Vitals:   04/30/24 1308  BP: 124/68  Pulse: 72  Height: 5' 4 (1.626 m)  Weight: 249 lb (112.9 kg)  SpO2: 98%  BMI (Calculated): 42.72    Physical Exam Vitals and nursing note reviewed.  Constitutional:      Appearance: Normal appearance. She is normal weight.  HENT:     Head: Normocephalic and atraumatic.     Nose: Nose normal.     Mouth/Throat:     Mouth: Mucous membranes are moist.  Eyes:     Extraocular Movements: Extraocular movements intact.     Conjunctiva/sclera: Conjunctivae normal.     Pupils: Pupils are equal, round, and reactive to light.  Cardiovascular:     Rate and Rhythm: Normal rate and regular rhythm.     Pulses: Normal pulses.     Heart sounds: Normal heart sounds.  Pulmonary:     Effort: Pulmonary effort is normal.     Breath sounds: Normal breath sounds.  Abdominal:     General: Abdomen is flat. Bowel sounds are normal.     Palpations: Abdomen is soft.  Musculoskeletal:        General: Normal range of motion.     Cervical back: Normal range of motion.  Skin:    General: Skin is warm and dry.  Neurological:     General: No focal deficit present.     Mental Status: She is alert and oriented to person, place, and time.  Psychiatric:        Mood and Affect: Mood normal.        Behavior: Behavior normal.        Thought Content: Thought content normal.        Judgment: Judgment normal.      No results found for any visits on 04/30/24.  Recent Results (from the past 2160 hours)  POCT Urinalysis Dipstick (18997)     Status: Abnormal   Collection Time: 02/19/24  3:47 PM  Result Value Ref Range   Color, UA yelllow    Clarity, UA clear    Glucose, UA Negative Negative   Bilirubin, UA negative    Ketones, UA negative    Spec Grav, UA 1.020  1.010 - 1.025   Blood, UA negative    pH, UA 6.0 5.0 - 8.0   Protein, UA Negative Negative   Urobilinogen, UA 0.2 0.2 or 1.0 E.U./dL   Nitrite, UA negative    Leukocytes, UA Trace (A) Negative   Appearance     Odor    Urine Culture     Status: None   Collection Time: 02/19/24  3:55 PM   Specimen: Urine, Clean Catch   UR  Result Value Ref Range   Urine Culture, Routine Final report    Organism ID, Bacteria Comment     Comment: Mixed urogenital flora 25,000-50,000 colony forming units per mL   Urinalysis, Routine w reflex microscopic     Status: None   Collection Time: 02/19/24  3:58 PM  Result Value Ref Range   Specific Gravity, UA 1.011 1.005 - 1.030   pH, UA 7.5 5.0 - 7.5   Color, UA Yellow  Yellow   Appearance Ur Clear Clear   Leukocytes,UA Negative Negative   Protein,UA Negative Negative/Trace   Glucose, UA Negative Negative   Ketones, UA Negative Negative   RBC, UA Negative Negative   Bilirubin, UA Negative Negative   Urobilinogen, Ur 0.2 0.2 - 1.0 mg/dL   Nitrite, UA Negative Negative   Microscopic Examination Comment     Comment: Microscopic not indicated and not performed.      Assessment & Plan:  Augmentin  Medrol  dose pack Tessalon  pearls Drink plenty of water  Problem List Items Addressed This Visit       Respiratory   Acute non-recurrent frontal sinusitis - Primary   Relevant Medications   amoxicillin -clavulanate (AUGMENTIN ) 875-125 MG tablet   benzonatate  (TESSALON  PERLES) 100 MG capsule   methylPREDNISolone  (MEDROL  DOSEPAK) 4 MG TBPK tablet    Return if symptoms worsen or fail to improve, for as scheduled with Alan.   Total time spent: 25 minutes. This time includes review of previous notes and results and patient face to face interaction during today's visit.    Jeoffrey Pollen, NP  04/30/2024   This document may have been prepared by Dragon Voice Recognition software and as such may include unintentional dictation errors.

## 2024-05-04 ENCOUNTER — Encounter (INDEPENDENT_AMBULATORY_CARE_PROVIDER_SITE_OTHER): Payer: Self-pay

## 2024-05-04 ENCOUNTER — Encounter: Payer: Self-pay | Admitting: Family

## 2024-05-05 ENCOUNTER — Other Ambulatory Visit: Payer: Self-pay | Admitting: Orthopedic Surgery

## 2024-05-05 ENCOUNTER — Ambulatory Visit: Admitting: Family

## 2024-05-06 ENCOUNTER — Other Ambulatory Visit: Payer: Self-pay | Admitting: Family

## 2024-05-07 ENCOUNTER — Telehealth: Payer: Self-pay

## 2024-05-07 NOTE — Telephone Encounter (Signed)
 Patient left message asking if we had received clearance form from [facility] ortho for full knee replacement

## 2024-05-08 ENCOUNTER — Encounter: Payer: Self-pay | Admitting: Family

## 2024-05-13 DIAGNOSIS — M1712 Unilateral primary osteoarthritis, left knee: Secondary | ICD-10-CM | POA: Diagnosis not present

## 2024-05-13 DIAGNOSIS — S83232A Complex tear of medial meniscus, current injury, left knee, initial encounter: Secondary | ICD-10-CM | POA: Diagnosis not present

## 2024-05-13 DIAGNOSIS — Z86718 Personal history of other venous thrombosis and embolism: Secondary | ICD-10-CM | POA: Diagnosis not present

## 2024-05-16 ENCOUNTER — Encounter: Payer: Self-pay | Admitting: Urgent Care

## 2024-05-18 ENCOUNTER — Other Ambulatory Visit: Payer: Self-pay

## 2024-05-18 ENCOUNTER — Encounter
Admission: RE | Admit: 2024-05-18 | Discharge: 2024-05-18 | Disposition: A | Discharge status: Home / Self Care | Source: Ambulatory Visit | Attending: Orthopedic Surgery | Admitting: Orthopedic Surgery

## 2024-05-18 VITALS — BP 110/68 | HR 68 | Resp 16 | Ht 64.0 in | Wt 245.0 lb

## 2024-05-18 DIAGNOSIS — Z01812 Encounter for preprocedural laboratory examination: Secondary | ICD-10-CM | POA: Diagnosis present

## 2024-05-18 DIAGNOSIS — R8290 Unspecified abnormal findings in urine: Secondary | ICD-10-CM | POA: Diagnosis not present

## 2024-05-18 DIAGNOSIS — R8271 Bacteriuria: Secondary | ICD-10-CM | POA: Insufficient documentation

## 2024-05-18 DIAGNOSIS — Z01818 Encounter for other preprocedural examination: Secondary | ICD-10-CM | POA: Insufficient documentation

## 2024-05-18 DIAGNOSIS — Z01810 Encounter for preprocedural cardiovascular examination: Secondary | ICD-10-CM | POA: Diagnosis present

## 2024-05-18 DIAGNOSIS — R8281 Pyuria: Secondary | ICD-10-CM | POA: Insufficient documentation

## 2024-05-18 DIAGNOSIS — M1712 Unilateral primary osteoarthritis, left knee: Secondary | ICD-10-CM

## 2024-05-18 HISTORY — DX: Sleep apnea, unspecified: G47.30

## 2024-05-18 HISTORY — DX: COVID-19: U07.1

## 2024-05-18 HISTORY — DX: Unspecified diastolic (congestive) heart failure: I50.30

## 2024-05-18 LAB — SURGICAL PCR SCREEN
MRSA, PCR: NEGATIVE
Staphylococcus aureus: NEGATIVE

## 2024-05-18 LAB — URINALYSIS, ROUTINE W REFLEX MICROSCOPIC
Bilirubin Urine: NEGATIVE
Glucose, UA: NEGATIVE mg/dL
Hgb urine dipstick: NEGATIVE
Ketones, ur: NEGATIVE mg/dL
Nitrite: NEGATIVE
Protein, ur: NEGATIVE mg/dL
Specific Gravity, Urine: 1.026 (ref 1.005–1.030)
pH: 5 (ref 5.0–8.0)

## 2024-05-18 LAB — CBC WITH DIFFERENTIAL/PLATELET
Abs Immature Granulocytes: 0.03 K/uL (ref 0.00–0.07)
Basophils Absolute: 0.1 K/uL (ref 0.0–0.1)
Basophils Relative: 1 %
Eosinophils Absolute: 0.3 K/uL (ref 0.0–0.5)
Eosinophils Relative: 4 %
HCT: 34 % — ABNORMAL LOW (ref 36.0–46.0)
Hemoglobin: 11.1 g/dL — ABNORMAL LOW (ref 12.0–15.0)
Immature Granulocytes: 0 %
Lymphocytes Relative: 22 %
Lymphs Abs: 2 K/uL (ref 0.7–4.0)
MCH: 28 pg (ref 26.0–34.0)
MCHC: 32.6 g/dL (ref 30.0–36.0)
MCV: 85.9 fL (ref 80.0–100.0)
Monocytes Absolute: 0.7 K/uL (ref 0.1–1.0)
Monocytes Relative: 7 %
Neutro Abs: 6 K/uL (ref 1.7–7.7)
Neutrophils Relative %: 66 %
Platelets: 283 K/uL (ref 150–400)
RBC: 3.96 MIL/uL (ref 3.87–5.11)
RDW: 13.5 % (ref 11.5–15.5)
WBC: 9 K/uL (ref 4.0–10.5)
nRBC: 0 % (ref 0.0–0.2)

## 2024-05-18 LAB — COMPREHENSIVE METABOLIC PANEL WITH GFR
ALT: 13 U/L (ref 0–44)
AST: 16 U/L (ref 15–41)
Albumin: 3.5 g/dL (ref 3.5–5.0)
Alkaline Phosphatase: 75 U/L (ref 38–126)
Anion gap: 10 (ref 5–15)
BUN: 15 mg/dL (ref 6–20)
CO2: 26 mmol/L (ref 22–32)
Calcium: 8.4 mg/dL — ABNORMAL LOW (ref 8.9–10.3)
Chloride: 100 mmol/L (ref 98–111)
Creatinine, Ser: 0.69 mg/dL (ref 0.44–1.00)
GFR, Estimated: >60 mL/min (ref >60)
Glucose, Bld: 117 mg/dL — ABNORMAL HIGH (ref 70–99)
Potassium: 3.4 mmol/L — ABNORMAL LOW (ref 3.5–5.1)
Sodium: 136 mmol/L (ref 135–145)
Total Bilirubin: 0.5 mg/dL (ref 0.0–1.2)
Total Protein: 7.3 g/dL (ref 6.5–8.1)

## 2024-05-18 NOTE — Patient Instructions (Signed)
 Your procedure is scheduled on: Thursday 05/28/24 Report to the Registration Desk on the 1st floor of the [facility]. To find out your arrival time, please call [phone] between 1PM - 3PM on: Wednesday 05/27/24 If your arrival time is 6:00 am, do not arrive before that time as the [facility] entrance doors do not open until 6:00 am.  REMEMBER: Instructions that are not followed completely may result in serious medical risk, up to and including death; or upon the discretion of your surgeon and anesthesiologist your surgery may need to be rescheduled.  Do not eat food after midnight the night before surgery.  No gum chewing or hard candies.  You may however, drink CLEAR liquids up to 2 hours before you are scheduled to arrive for your surgery. Do not drink anything within 2 hours of your scheduled arrival time.  Clear liquids include: - water  - apple juice without pulp - gatorade (not RED colors) - black coffee or tea (Do NOT add milk or creamers to the coffee or tea) Do NOT drink anything that is not on this list.  **Type 1 and Type 2 diabetics should only drink water.**  In addition, your doctor has ordered for you to drink the provided:  Ensure Pre-Surgery Clear Carbohydrate Drink  Drinking this carbohydrate drink up to two hours before surgery helps to reduce insulin  resistance and improve patient outcomes. Please complete drinking 2 hours before scheduled arrival time.  One week prior to surgery: Stop Anti-inflammatories (NSAIDS) such as Advil , Aleve, Ibuprofen , Motrin , Naproxen, Naprosyn and Aspirin based products such as Excedrin, Goody's Powder, BC Powder.  You may however, continue to take Tylenol  if needed for pain up until the day of surgery.  Stop ANY OVER THE COUNTER supplements and vitamins until after surgery.  Continue taking all of your other prescription medications up until the day of surgery.  ON THE DAY OF SURGERY ONLY TAKE THESE MEDICATIONS WITH SIPS  OF WATER:  ARIPiprazole (ABILIFY) 30 MG tablet  DULoxetine (CYMBALTA) 60 MG capsule  esomeprazole  (NEXIUM ) 40 MG capsule  gabapentin  (NEURONTIN ) 600 MG tablet  levothyroxine  (SYNTHROID ) 200 MCG tablet  liothyronine (CYTOMEL) 5 MCG tablet  metoprolol tartrate (LOPRESSOR) 50 MG tablet  nortriptyline (PAMELOR) 10 MG capsule  rosuvastatin (CRESTOR) 40 MG tablet   Use inhalers on the day of surgery and bring to the hospital.  No Alcohol for 24 hours before or after surgery.  No Smoking including e-cigarettes for 24 hours before surgery.  No chewable tobacco products for at least 6 hours before surgery.  No nicotine  patches on the day of surgery.  Do not use any recreational drugs for at least a week (preferably 2 weeks) before your surgery.  Please be advised that the combination of cocaine and anesthesia may have negative outcomes, up to and including death. If you test positive for cocaine, your surgery will be cancelled.  On the morning of surgery brush your teeth with toothpaste and water, you may rinse your mouth with mouthwash if you wish. Do not swallow any toothpaste or mouthwash.  Use CHG Soap or wipes as directed on instruction sheet.  Do not wear lotions, powders, or perfumes on the day of surgery   Do not shave body hair from the neck down 48 hours before surgery.  Wear comfortable clothing (specific to your surgery type) to the hospital.  Do not wear jewelry, make-up, hairpins, clips or nail polish.  For welded (permanent) jewelry: bracelets, anklets, waist bands, etc.  Please  have this removed prior to surgery.  If it is not removed, there is a chance that hospital personnel will need to cut it off on the day of surgery.  Contact lenses, hearing aids and dentures may not be worn into surgery.  Do not bring valuables to the hospital. [facility] is not responsible for any missing/lost belongings or valuables.   Bring your C-PAP to the hospital in case you may have  to spend the night.   Notify your doctor if there is any change in your medical condition (cold, fever, infection).  After surgery, you can help prevent lung complications by doing breathing exercises.  Take deep breaths and cough every 1-2 hours. Your doctor may order a device called an Incentive Spirometer to help you take deep breaths.  If you are being admitted to the hospital overnight, leave your suitcase in the car. After surgery it may be brought to your room.  In case of increased patient census, it may be necessary for you, the patient, to continue your postoperative care in the Same Day Surgery department.  Please call the Pre-admissions Testing Dept. at [phone] if you have any questions about these instructions.  Surgery Visitation Policy:  Patients having surgery or a procedure may have two visitors.  Children under the age of 23 must have an adult with them who is not the patient.  Inpatient Visitation:    Visiting hours are 7 a.m. to 8 p.m. Up to four visitors are allowed at one time in a patient room. The visitors may rotate out with other people during the day.  One visitor age 4 or older may stay with the patient overnight and must be in the room by 8 p.m.   Merchandiser, Retail to address health-related social needs:  [email or web address]    Pre-operative 4 CHG Bath Instructions   You can play a key role in reducing the risk of infection after surgery. Your skin needs to be as free of germs as possible. You can reduce the number of germs on your skin by washing with CHG (chlorhexidine  gluconate) soap before surgery. CHG is an antiseptic soap that kills germs and continues to kill germs even after washing.   DO NOT use if you have an allergy to chlorhexidine /CHG or antibacterial soaps. If your skin becomes reddened or irritated, stop using the CHG and notify one of our RNs at [phone].   Please shower with the CHG soap starting 4  days before surgery using the following schedule:   Sunday 05/24/24 - Wednesday 05/27/24    Please keep in mind the following:  DO NOT shave, including legs and underarms, starting the day of your first shower.   You may shave your face at any point before/day of surgery.  Place clean sheets on your bed the day you start using CHG soap. Use a clean washcloth (not used since being washed) for each shower. DO NOT sleep with pets once you start using the CHG.   CHG Shower Instructions:  If you choose to wash your hair and private area, wash first with your normal shampoo/soap.  After you use shampoo/soap, rinse your hair and body thoroughly to remove shampoo/soap residue.  Turn the water OFF and apply about 3 tablespoons (45 ml) of CHG soap to a CLEAN washcloth.  Apply CHG soap ONLY FROM YOUR NECK DOWN TO YOUR TOES (washing for 3-5 minutes)  DO NOT use CHG soap on face, private areas, open wounds, or sores.  Pay  special attention to the area where your surgery is being performed.  If you are having back surgery, having someone wash your back for you may be helpful. Wait 2 minutes after CHG soap is applied, then you may rinse off the CHG soap.  Pat dry with a clean towel  Put on clean clothes/pajamas   If you choose to wear lotion, please use ONLY the CHG-compatible lotions on the back of this paper.     Additional instructions for the day of surgery: DO NOT APPLY any lotions, deodorants, cologne, or perfumes.   Put on clean/comfortable clothes.  Brush your teeth.  Ask your nurse before applying any prescription medications to the skin.      CHG Compatible Lotions   Aveeno Moisturizing lotion  Cetaphil Moisturizing Cream  Cetaphil Moisturizing Lotion  Clairol Herbal Essence Moisturizing Lotion, Dry Skin  Clairol Herbal Essence Moisturizing Lotion, Extra Dry Skin  Clairol Herbal Essence Moisturizing Lotion, Normal Skin  Curel Age Defying Therapeutic Moisturizing Lotion with  Alpha Hydroxy  Curel Extreme Care Body Lotion  Curel Soothing Hands Moisturizing Hand Lotion  Curel Therapeutic Moisturizing Cream, Fragrance-Free  Curel Therapeutic Moisturizing Lotion, Fragrance-Free  Curel Therapeutic Moisturizing Lotion, Original Formula  Eucerin Daily Replenishing Lotion  Eucerin Dry Skin Therapy Plus Alpha Hydroxy Crme  Eucerin Dry Skin Therapy Plus Alpha Hydroxy Lotion  Eucerin Original Crme  Eucerin Original Lotion  Eucerin Plus Crme Eucerin Plus Lotion  Eucerin TriLipid Replenishing Lotion  Keri Anti-Bacterial Hand Lotion  Keri Deep Conditioning Original Lotion Dry Skin Formula Softly Scented  Keri Deep Conditioning Original Lotion, Fragrance Free Sensitive Skin Formula  Keri Lotion Fast Absorbing Fragrance Free Sensitive Skin Formula  Keri Lotion Fast Absorbing Softly Scented Dry Skin Formula  Keri Original Lotion  Keri Skin Renewal Lotion Keri Silky Smooth Lotion  Keri Silky Smooth Sensitive Skin Lotion  Nivea Body Creamy Conditioning Oil  Nivea Body Extra Enriched Lotion  Nivea Body Original Lotion  Nivea Body Sheer Moisturizing Lotion Nivea Crme  Nivea Skin Firming Lotion  NutraDerm 30 Skin Lotion  NutraDerm Skin Lotion  NutraDerm Therapeutic Skin Cream  NutraDerm Therapeutic Skin Lotion  ProShield Protective Hand Cream  Provon moisturizing lotion  How to Use an Incentive Spirometer  An incentive spirometer is a tool that measures how well you are filling your lungs with each breath. Learning to take long, deep breaths using this tool can help you keep your lungs clear and active. This may help to reverse or lessen your chance of developing breathing (pulmonary) problems, especially infection. You may be asked to use a spirometer: After a surgery. If you have a lung problem or a history of smoking. After a long period of time when you have been unable to move or be active. If the spirometer includes an indicator to show the highest number that  you have reached, your health care provider or respiratory therapist will help you set a goal. Keep a log of your progress as told by your health care provider. What are the risks? Breathing too quickly may cause dizziness or cause you to pass out. Take your time so you do not get dizzy or light-headed. If you are in pain, you may need to take pain medicine before doing incentive spirometry. It is harder to take a deep breath if you are having pain. How to use your incentive spirometer  Sit up on the edge of your bed or on a chair. Hold the incentive spirometer so that it is in an  upright position. Before you use the spirometer, breathe out normally. Place the mouthpiece in your mouth. Make sure your lips are closed tightly around it. Breathe in slowly and as deeply as you can through your mouth, causing the piston or the ball to rise toward the top of the chamber. Hold your breath for 3-5 seconds, or for as long as possible. If the spirometer includes a coach indicator, use this to guide you in breathing. Slow down your breathing if the indicator goes above the marked areas. Remove the mouthpiece from your mouth and breathe out normally. The piston or ball will return to the bottom of the chamber. Rest for a few seconds, then repeat the steps 10 or more times. Take your time and take a few normal breaths between deep breaths so that you do not get dizzy or light-headed. Do this every 1-2 hours when you are awake. If the spirometer includes a goal marker to show the highest number you have reached (best effort), use this as a goal to work toward during each repetition. After each set of 10 deep breaths, cough a few times. This will help to make sure that your lungs are clear. If you have an incision on your chest or abdomen from surgery, place a pillow or a rolled-up towel firmly against the incision when you cough. This can help to reduce pain while taking deep breaths and coughing. General  tips When you are able to get out of bed: Walk around often. Continue to take deep breaths and cough in order to clear your lungs. Keep using the incentive spirometer until your health care provider says it is okay to stop using it. If you have been in the hospital, you may be told to keep using the spirometer at home. Contact a health care provider if: You are having difficulty using the spirometer. You have trouble using the spirometer as often as instructed. Your pain medicine is not giving enough relief for you to use the spirometer as told. You have a fever. Get help right away if: You develop shortness of breath. You develop a cough with bloody mucus from the lungs. You have fluid or blood coming from an incision site after you cough. Summary An incentive spirometer is a tool that can help you learn to take long, deep breaths to keep your lungs clear and active. You may be asked to use a spirometer after a surgery, if you have a lung problem or a history of smoking, or if you have been inactive for a long period of time. Use your incentive spirometer as instructed every 1-2 hours while you are awake. If you have an incision on your chest or abdomen, place a pillow or a rolled-up towel firmly against your incision when you cough. This will help to reduce pain. Get help right away if you have shortness of breath, you cough up bloody mucus, or blood comes from your incision when you cough. This information is not intended to replace advice given to you by your health care provider. Make sure you discuss any questions you have with your health care provider. Document Revised: 09/21/2019 Document Reviewed: 09/21/2019 Elsevier Patient Education  2023 [employer].

## 2024-05-19 ENCOUNTER — Encounter
Admission: RE | Disposition: A | Discharge status: Home / Self Care | Payer: Self-pay | Source: Home / Self Care | Attending: Vascular Surgery

## 2024-05-19 ENCOUNTER — Other Ambulatory Visit: Payer: Self-pay

## 2024-05-19 ENCOUNTER — Ambulatory Visit
Admission: RE | Admit: 2024-05-19 | Discharge: 2024-05-19 | Disposition: A | Discharge status: Home / Self Care | Attending: Vascular Surgery | Admitting: Vascular Surgery

## 2024-05-19 ENCOUNTER — Encounter: Payer: Self-pay | Admitting: Vascular Surgery

## 2024-05-19 DIAGNOSIS — Z4089 Encounter for other prophylactic surgery: Secondary | ICD-10-CM | POA: Diagnosis not present

## 2024-05-19 DIAGNOSIS — Z86718 Personal history of other venous thrombosis and embolism: Secondary | ICD-10-CM | POA: Diagnosis not present

## 2024-05-19 DIAGNOSIS — I82409 Acute embolism and thrombosis of unspecified deep veins of unspecified lower extremity: Secondary | ICD-10-CM

## 2024-05-19 DIAGNOSIS — Z409 Encounter for prophylactic surgery, unspecified: Secondary | ICD-10-CM | POA: Insufficient documentation

## 2024-05-19 HISTORY — PX: IVC FILTER INSERTION: CATH118245

## 2024-05-19 SURGERY — IVC FILTER INSERTION
Anesthesia: Moderate Sedation

## 2024-05-19 MED ORDER — MIDAZOLAM HCL 2 MG/ML PO SYRP: 8.0000 mg | ORAL_SOLUTION | Freq: Once | ORAL | Status: DC | PRN

## 2024-05-19 MED ORDER — FENTANYL CITRATE (PF) 50 MCG/ML IJ SOSY
PREFILLED_SYRINGE | INTRAMUSCULAR | Status: AC
  Filled 2024-05-19: qty 1

## 2024-05-19 MED ORDER — FAMOTIDINE 20 MG PO TABS: 40.0000 mg | ORAL_TABLET | Freq: Once | ORAL | Status: DC | PRN

## 2024-05-19 MED ORDER — SODIUM CHLORIDE 0.9 % IV SOLN: INTRAVENOUS | Status: DC

## 2024-05-19 MED ORDER — DIPHENHYDRAMINE HCL 50 MG/ML IJ SOLN: 50.0000 mg | Freq: Once | INTRAMUSCULAR | Status: DC | PRN

## 2024-05-19 MED ORDER — IODIXANOL 320 MG/ML IV SOLN
INTRAVENOUS | Status: DC | PRN
  Administered 2024-05-19: 10 mL

## 2024-05-19 MED ORDER — CEFAZOLIN SODIUM-DEXTROSE 2-4 GM/100ML-% IV SOLN
2.0000 g | INTRAVENOUS | Status: AC
Start: 2024-05-19 — End: 2024-05-19
  Administered 2024-05-19: 2 g via INTRAVENOUS

## 2024-05-19 MED ORDER — MIDAZOLAM HCL 2 MG/2ML IJ SOLN
INTRAMUSCULAR | Status: AC
  Filled 2024-05-19: qty 2

## 2024-05-19 MED ORDER — CEFAZOLIN SODIUM-DEXTROSE 2-4 GM/100ML-% IV SOLN
INTRAVENOUS | Status: AC
  Filled 2024-05-19: qty 100

## 2024-05-19 MED ORDER — LIDOCAINE HCL (PF) 1 % IJ SOLN
INTRAMUSCULAR | Status: DC | PRN
Start: 2024-05-19 — End: 2024-05-19
  Administered 2024-05-19: 10 mL

## 2024-05-19 MED ORDER — FENTANYL CITRATE (PF) 100 MCG/2ML IJ SOLN
INTRAMUSCULAR | Status: DC | PRN
  Administered 2024-05-19: 50 ug via INTRAVENOUS

## 2024-05-19 MED ORDER — CEFAZOLIN SODIUM-DEXTROSE 2-4 GM/100ML-% IV SOLN: 2.0000 g | INTRAVENOUS | Status: DC

## 2024-05-19 MED ORDER — ONDANSETRON HCL 4 MG/2ML IJ SOLN: 4.0000 mg | Freq: Four times a day (QID) | INTRAMUSCULAR | Status: DC | PRN

## 2024-05-19 MED ORDER — METHYLPREDNISOLONE SODIUM SUCC 125 MG IJ SOLR: 125.0000 mg | Freq: Once | INTRAMUSCULAR | Status: DC | PRN

## 2024-05-19 MED ORDER — HEPARIN (PORCINE) IN NACL 1000-0.9 UT/500ML-% IV SOLN
INTRAVENOUS | Status: DC | PRN
Start: 2024-05-19 — End: 2024-05-19
  Administered 2024-05-19: 500 mL

## 2024-05-19 MED ORDER — MIDAZOLAM HCL (PF) 2 MG/2ML IJ SOLN
INTRAMUSCULAR | Status: DC | PRN
Start: 2024-05-19 — End: 2024-05-19
  Administered 2024-05-19: 2 mg via INTRAVENOUS

## 2024-05-19 SURGICAL SUPPLY — 8 items
COVER PROBE ULTRASOUND 5X96 (MISCELLANEOUS) IMPLANT
KIT FEMORAL DEL DENALI (Miscellaneous) IMPLANT
NDL ENTRY 21GA 7CM ECHOTIP (NEEDLE) IMPLANT
NEEDLE ENTRY 21GA 7CM ECHOTIP (NEEDLE) ×1 IMPLANT
PACK ANGIOGRAPHY (CUSTOM PROCEDURE TRAY) ×2 IMPLANT
SET INTRO CAPELLA COAXIAL (SET/KITS/TRAYS/PACK) IMPLANT
WIRE J 3MM .035X145CM (WIRE) IMPLANT
WIRE SUPRACORE 190CM (WIRE) IMPLANT

## 2024-05-19 NOTE — Discharge Instructions (Signed)
 Inferior Vena Cava Filter Insertion, Care After The following information offers guidance on how to care for yourself after your procedure. Your health care provider may also give you more specific instructions. If you have problems or questions, contact your health care provider. What can I expect after the procedure? After the procedure, it is common to have: Mild pain and bruising around the area where the long, thin tube (catheter) was inserted in your neck or groin to deliver the filter. Tiredness (fatigue). Follow these instructions at home: Insertion site care Follow instructions from your health care provider about how to take care of your catheter insertion site. Make sure you: Wash your hands with soap and water for at least 20 seconds before and after you change your bandage (dressing). If soap and water are not available, use hand sanitizer. Remove your dressing in 48 hours You may shower tomorrow Keep the dressing and the insertion site clean and dry. Check your insertion site every day for signs of infection. Check for: More redness, swelling, or pain. Fluid or blood. Warmth. Pus or a bad smell. Do not take baths, swim, or use a hot tub for 7 days after your procedure.  Activity: Avoid strenuous exercise or activities that take a lot of effort for 1 week. Do not go back to school or work for 3 days. Avoid any heavy lifting for 1 week.  General instructions If you were given a sedative during the procedure, it can affect you for several hours. Do not drive or operate machinery for 24 hours Take over-the-counter and prescription medicines only as told by your health care provider. Wear compression stockings as told by your health care provider. These stockings help to prevent blood clots and reduce swelling in your legs. Keep all follow-up visits. This is important. Contact a health care provider if: You have any of these signs of infection: More redness, swelling, or pain  around your catheter insertion site. Fluid or blood coming from your insertion site. Warmth coming from your insertion site. Pus or a bad smell coming from your insertion site. A fever. You are dizzy. You have nausea and vomiting. You develop a rash. Get help right away if: You have blood coming from your catheter insertion site (active bleeding). If you have bleeding from the insertion site, lie down, apply pressure to the area with a clean cloth or gauze, and get help right away. You have chest pain, a cough, or difficulty breathing. You have shortness of breath, feel faint, or pass out. You cough up blood. You have severe pain in your abdomen. You develop swelling and discoloration or pain in your legs. Your legs become pale and cold or blue. You have weakness, difficulty moving your arms or legs, or balance problems. You develop problems with speech or vision. These symptoms may be an emergency. Get help right away. Call 911. Do not wait to see if the symptoms will go away. Do not drive yourself to the hospital. Summary Follow instructions from your health care provider about how to take care of your catheter insertion site. After the procedure, it is common to have mild pain and bruising where a catheter was inserted at your neck or groin. Every day, check for signs of infection at the catheter insertion site. Get help right away if you have active bleeding, chest pain, or trouble breathing. This information is not intended to replace advice given to you by your health care provider. Make sure you discuss any questions you  have with your health care provider. Document Revised: 07/18/2021 Document Reviewed: 07/01/2019 Elsevier Patient Education  2023 [employer].

## 2024-05-19 NOTE — Interval H&P Note (Signed)
 History and Physical Interval Note:  05/19/2024 8:28 AM  Alexis Christensen  has presented today for surgery, with the diagnosis of IVC filter placement   DVT.  The various methods of treatment have been discussed with the patient and family. After consideration of risks, benefits and other options for treatment, the patient has consented to  Procedure(s): IVC FILTER INSERTION (N/A) as a surgical intervention.  The patient's history has been reviewed, patient examined, no change in status, stable for surgery.  I have reviewed the patient's chart and labs.  Questions were answered to the patient's satisfaction.     Cordella Shawl

## 2024-05-19 NOTE — Op Note (Signed)
 [facility]   OPERATIVE NOTE    PRE-OPERATIVE DIAGNOSIS: History of DVT with upcoming joint replacement surgery  POST-OPERATIVE DIAGNOSIS: Same  PROCEDURE: 1.   Ultrasound guidance for vascular access to the right common femoral vein 2.   Catheter placement into the inferior vena cava 3.   Inferior venacavogram 4.   Placement of a Denali IVC filter  SURGEON: Cordella Shawl  ASSISTANT(S): None  ANESTHESIA: Conscious sedation was administered by the interventional radiology RN under my direct supervision. IV Versed  plus fentanyl  were utilized. Continuous ECG, pulse oximetry and blood pressure was monitored throughout the entire procedure. Conscious sedation was for a total of 14 minutes and 37 seconds.  ESTIMATED BLOOD LOSS: minimal  FINDING(S): 1.  Patent IVC  SPECIMEN(S):  none  INDICATIONS:   Alexis Christensen is a 53 y.o. y.o. female who presents with history of DVT with planned joint replacement surgery.  Inferior vena cava filter is indicated for this reason.  Risks and benefits including filter thrombosis, migration, fracture, bleeding, and infection were all discussed.  We discussed that all IVC filters that we place can be removed if desired from the patient once the need for the filter has passed.    DESCRIPTION: After obtaining full informed written consent, the patient was brought back to the vascular suite. The skin was sterilely prepped and draped in a sterile surgical field was created. Ultrasound was placed in a sterile sleeve. The common femoral vein was echolucent and compressible indicating patency. Image was recorded for the permanent record. The puncture was made under continuous real-time ultrasound guidance.  The common femoral vein was accessed under direct ultrasound guidance without difficulty with a micropuncture needle. Microwire was then advanced under fluoroscopic guidance without difficulty. Micro-sheath was then inserted and a J-wire  was then placed. The dilator is passed over the wire and the delivery sheath was placed into the inferior vena cava.  Inferior venacavogram was performed. This demonstrated a patent IVC with the level of the renal veins at L1.  The filter was then deployed into the inferior vena cava at the level of L2 just below the renal veins. The delivery sheath was then removed. Pressure was held. Sterile dressings were placed. The patient tolerated the procedure well and was taken to the recovery room in stable condition.  Interpretation: IVC is widely patent.  Measures 22 mm in diameter.  Denali filter is deployed at the L2 level and upright orientation  COMPLICATIONS: None  CONDITION: Stable  Cordella Shawl  05/19/2024, 9:00 AM

## 2024-05-19 NOTE — H&P (View-Only) (Signed)
 MRN : [id number]  Alexis Christensen is a 53 y.o. ([birth year]) female who presents with chief complaint of legs hurt and swell.  History of Present Illness:   The patient presents to [facility] today for placement of an IVC filter prior to joint placement surgery.   DVT was identified years ago and was treated with anticoagulation.  The presenting symptoms were pain and swelling in the lower extremity.  She has been having significant knee pain following injuries years ago and at this point her orthopedic surgeon has recommended placement of an IVC filter given her previous history of right lower extremity DVT years ago prior to surgery for her knee.  The patient has been using compression therapy at this point.   No SOB or pleuritic chest pains.  No cough or hemoptysis.   The patient is currently not on anticoagulation  Current Meds  Medication Sig   albuterol  (VENTOLIN  HFA) 108 (90 Base) MCG/ACT inhaler Inhale 1 puff into the lungs every 6 (six) hours as needed for wheezing or shortness of breath.   ARIPiprazole (ABILIFY) 30 MG tablet Take 30 mg by mouth daily.   aspirin 81 MG chewable tablet Aspirin 81 MG Oral Tablet Chewable QTY: 30 tablet Days: 30 Refills: 0  Written: 01/23/21 Patient Instructions: once a day   Budeson-Glycopyrrol-Formoterol (BREZTRI AEROSPHERE) 160-9-4.8 MCG/ACT AERO Inhale 2 puffs into the lungs in the morning and at bedtime.   celecoxib (CELEBREX) 100 MG capsule Take 100 mg by mouth 2 (two) times daily.   colestipol  (COLESTID ) 1 g tablet TAKE 2 TABLETS(2 GRAMS) BY MOUTH TWICE DAILY   dicyclomine (BENTYL) 10 MG capsule TAKE 1 CAPSULE BY MOUTH EVERY 8 HOURS AS NEEDED FOR ABDOMINAL PAIN   DULoxetine (CYMBALTA) 60 MG capsule Take 60 mg by mouth 2 (two) times daily.   esomeprazole  (NEXIUM ) 40 MG capsule Take 1 capsule (40 mg total) by mouth 2 (two) times daily before a meal.   levothyroxine  (SYNTHROID ) 200 MCG tablet Take 200 mcg by mouth  daily before breakfast.   liothyronine (CYTOMEL) 5 MCG tablet Take 10 mcg by mouth daily.   lisinopril  (ZESTRIL ) 10 MG tablet Take 1 tablet (10 mg total) by mouth daily.   metoprolol tartrate (LOPRESSOR) 50 MG tablet Take 50 mg by mouth daily in the afternoon.   nortriptyline (PAMELOR) 10 MG capsule Take 10 mg in the morning   potassium chloride  SA (KLOR-CON  M) 20 MEQ tablet Take 1 tablet (20 mEq total) by mouth 2 (two) times daily.   rosuvastatin (CRESTOR) 40 MG tablet Take 40 mg by mouth daily.   spironolactone (ALDACTONE) 25 MG tablet TAKE 1/2 (HALF) TABLET BY MOUTH ONCE DAILY.   triamcinolone  cream (KENALOG ) 0.1 % Apply 1 Application topically 2 (two) times daily.    Past Medical History:  Diagnosis Date   (HFpEF) heart failure with preserved ejection fraction (HCC)    Acute deep vein thrombosis (DVT) of right lower extremity (HCC) 2016   Acute non-recurrent maxillary sinusitis 07/16/2023   ADHD    Adopted    ANA positive    Anemia    Anxiety    Arthritis    Asthma    B12 deficiency    Bilateral hand pain    Bilateral swelling of feet and ankles    Bipolar 1 disorder (HCC)    Carpal tunnel syndrome of left wrist    Chest pain    Chronic diarrhea 09/07/2015   Chronic left  shoulder pain    Clostridioides difficile infection 04/06/2019   Depression    Diverticulosis    Edema leg    Effusion of knee 12/30/2013   GERD (gastroesophageal reflux disease)    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 pylori) infection 08/24/2015   Hiatal hernia    History of 2019 novel coronavirus disease (COVID-19) 09/09/2020   a.) PCR testing (+): 09/09/2020, 01/12/2021, 07/17/2021   HLD (hyperlipidemia)    Hx MRSA infection    Hypothyroidism    Influenza A 10/01/2022   Left wrist sprain, sequela 01/14/2023   Lipoma of arm 05/11/2013   Lower extremity edema    Lumbago    Lymphedema 04/26/2016   Migraine with aura    Multinodular goiter     Nephrolithiasis    Plantar fascial fibromatosis    Pneumonia due to COVID-19 virus    Post traumatic stress disorder (PTSD)    Pre-diabetes    PVD (peripheral vascular disease)    Seasonal allergies    Shortness of breath on exertion    Sleep apnea    Stress incontinence    Swelling of limb 04/26/2016   Urge incontinence    Venous insufficiency    Venous ulcer (HCC)    Vitamin D  deficiency    Weight loss, unintentional 09/07/2015   Wound infection, posttraumatic 01/14/2023    Past Surgical History:  Procedure Laterality Date   ABDOMINAL HYSTERECTOMY     ANKLE SURGERY     CHOLECYSTECTOMY     COLONOSCOPY WITH PROPOFOL  N/A 06/24/2019   Procedure: COLONOSCOPY WITH PROPOFOL ;  Surgeon: Toledo, Ladell POUR, MD;  Location: [facility];  Service: Gastroenterology;  Laterality: N/A;   CYSTOSCOPY N/A 04/09/2016   Procedure: CYSTOSCOPY;  Surgeon: Glendia Elizabeth, MD;  Location: [facility];  Service: Urology;  Laterality: N/A;   DILATION AND CURETTAGE OF UTERUS     ESOPHAGOGASTRODUODENOSCOPY (EGD) WITH PROPOFOL  N/A 06/24/2019   Procedure: ESOPHAGOGASTRODUODENOSCOPY (EGD) WITH PROPOFOL ;  Surgeon: Toledo, Ladell POUR, MD;  Location: [facility];  Service: Gastroenterology;  Laterality: N/A;   ESOPHAGOGASTRODUODENOSCOPY (EGD) WITH PROPOFOL  N/A 10/01/2023   Procedure: ESOPHAGOGASTRODUODENOSCOPY (EGD) WITH PROPOFOL ;  Surgeon: Therisa Bi, MD;  Location: [facility];  Service: Gastroenterology;  Laterality: N/A;   INGUINAL HERNIA REPAIR Bilateral 04/18/2017   Procedure: LAPAROSCOPIC BILATERAL INGUINAL HERNIA REPAIR;  Surgeon: Jordis Laneta FALCON, MD;  Location: [facility];  Service: General;  Laterality: Bilateral;   INTERSTIM IMPLANT PLACEMENT N/A 11/26/2016   Procedure: RENNA IMPLANT FIRST STAGE;  Surgeon: Elizabeth Glendia, MD;  Location: [facility];  Service: Urology;  Laterality: N/A;   INTERSTIM IMPLANT PLACEMENT N/A 11/26/2016   Procedure: RENNA IMPLANT SECOND STAGE;  Surgeon: Elizabeth Glendia, MD;  Location: [facility];  Service: Urology;  Laterality: N/A;   INTERSTIM IMPLANT PLACEMENT N/A 02/18/2023   Procedure: RENNA IMPLANT FIRST STAGE WITH IMPEDANCE CHECK;  Surgeon: Elizabeth Glendia, MD;  Location: [facility];  Service: Urology;  Laterality: N/A;   INTERSTIM IMPLANT PLACEMENT N/A 02/18/2023   Procedure: RENNA IMPLANT SECOND STAGE;  Surgeon: Elizabeth Glendia, MD;  Location: [facility];  Service: Urology;  Laterality: N/A;   INTERSTIM IMPLANT REMOVAL N/A 02/18/2023   Procedure: REMOVAL OF INTERSTIM IMPLANT;  Surgeon: Elizabeth Glendia, MD;  Location: [facility];  Service: Urology;  Laterality: N/A;   JOINT REPLACEMENT Right    knee   KNEE ARTHROSCOPY Bilateral    KNEE ARTHROSCOPY WITH LATERAL RELEASE Left 10/18/2015   Procedure: KNEE ARTHROSCOPY LATERAL AND PARTIAL  SYNOVECTOMY;  Surgeon: Ozell Flake, MD;  Location: [facility];  Service: Orthopedics;  Laterality: Left;   Lymph Node removal  2015   Neck   PUBOVAGINAL SLING N/A 04/09/2016   Procedure: PUBO-VAGINAL SLING/ RETROPUBIC SLING;  Surgeon: Glendia Elizabeth, MD;  Location: [facility];  Service: Urology;  Laterality: N/A;   REPLACEMENT TOTAL KNEE Right    SHOULDER SURGERY Right 2014   TUBAL LIGATION     WRIST SURGERY Right    metal plate    Social History Social History   Tobacco Use   Smoking status: Former    Current packs/day: 0.00    Average packs/day: 0.5 packs/day for 11.0 years (5.5 ttl pk-yrs)    Types: Cigarettes    Start date: 07/16/2008    Quit date: 07/16/2019    Years since quitting: 4.8    Passive exposure: Past   Smokeless tobacco: Never   Tobacco comments:    STRESS RELATED  Vaping Use   Vaping status: Never Used  Substance Use Topics   Alcohol use: Not Currently    Comment: Hisotry of ETOH abouse ASB patient   Drug use: Not Currently    Comment: Pt listed history of abuse of prescription drugs    Family History Family History  Adopted: Yes  Family history unknown: Yes    Allergies   Allergen Reactions   Geodon [Ziprasidone Hydrochloride] Other (See Comments)    Numbness ,sob, headaches, blurred vision   Sulfacetamide Sodium Hives   Ziprasidone Anaphylaxis and Other (See Comments)    Other reaction(s): Numbness ,sob, headaches, blurred vision   Ace Inhibitors Rash   Erythromycin Rash and Hives   Hydromorphone Rash   Lamictal [Lamotrigine] Rash    Other reaction(s): Unknown   Strawberry (Diagnostic) Rash   Sulfa Antibiotics Hives and Rash     REVIEW OF SYSTEMS (Negative unless checked)  Constitutional: [] Weight loss  [] Fever  [] Chills Cardiac: [] Chest pain   [] Chest pressure   [] Palpitations   [] Shortness of breath when laying flat   [] Shortness of breath with exertion. Vascular:  [] Pain in legs with walking   [x] Pain in legs at rest  [x] History of DVT   [] Phlebitis   [x] Swelling in legs   [] Varicose veins   [] Non-healing ulcers Pulmonary:   [] Uses home oxygen   [] Productive cough   [] Hemoptysis   [] Wheeze  [] COPD   [] Asthma Neurologic:  [] Dizziness   [] Seizures   [] History of stroke   [] History of TIA  [] Aphasia   [] Vissual changes   [] Weakness or numbness in arm   [] Weakness or numbness in leg Musculoskeletal:   [] Joint swelling   [x] Joint pain   [] Low back pain Hematologic:  [] Easy bruising  [] Easy bleeding   [] Hypercoagulable state   [] Anemic Gastrointestinal:  [] Diarrhea   [] Vomiting  [] Gastroesophageal reflux/heartburn   [] Difficulty swallowing. Genitourinary:  [] Chronic kidney disease   [] Difficult urination  [] Frequent urination   [] Blood in urine Skin:  [] Rashes   [] Ulcers  Psychological:  [] History of anxiety   []  History of major depression.  Physical Examination  Vitals:   05/19/24 0725  BP: 123/84  Pulse: 67  Resp: 19  Temp: (!) 97.4 F (36.3 C)  TempSrc: Temporal  SpO2: 95%  Weight: 111.9 kg  Height: 5' 4 (1.626 m)   Body mass index is 42.36 kg/m. Gen: WD/WN, NAD Head: [place]/AT, No temporalis wasting.  Ear/Nose/Throat: Hearing grossly  intact, nares w/o erythema or drainage, pinna without lesions Eyes: PER, EOMI, sclera nonicteric.  Neck: Supple, no  gross masses.  No JVD.  Pulmonary:  Good air movement, no audible wheezing, no use of accessory muscles.  Cardiac: RRR, precordium not hyperdynamic. Vascular:  scattered varicosities present bilaterally.  Moderate venous stasis changes to the legs bilaterally.  Trace soft pitting edema. CEAP [id number]   Vessel Right Left  Radial Palpable Palpable  Gastrointestinal: soft, non-distended. No guarding/no peritoneal signs.  Musculoskeletal: M/S 5/5 throughout.  No deformity.  Neurologic: CN 2-12 intact. Pain and light touch intact in extremities.  Symmetrical.  Speech is fluent. Motor exam as listed above. Psychiatric: Judgment intact, Mood & affect appropriate for pt's clinical situation. Dermatologic: Venous rashes no ulcers noted.  No changes consistent with cellulitis. Lymph : No lichenification or skin changes of chronic lymphedema.  CBC Lab Results  Component Value Date   WBC 9.0 05/18/2024   HGB 11.1 (L) 05/18/2024   HCT 34.0 (L) 05/18/2024   MCV 85.9 05/18/2024   PLT 283 05/18/2024    BMET    Component Value Date/Time   NA 136 05/18/2024 1331   NA 143 09/27/2023 0939   NA 138 11/07/2012 0517   K 3.4 (L) 05/18/2024 1331   K 3.9 11/07/2012 0517   CL 100 05/18/2024 1331   CL 109 (H) 11/07/2012 0517   CO2 26 05/18/2024 1331   CO2 25 11/07/2012 0517   GLUCOSE 117 (H) 05/18/2024 1331   GLUCOSE 91 11/07/2012 0517   BUN 15 05/18/2024 1331   BUN 12 09/27/2023 0939   BUN 7 11/07/2012 0517   CREATININE 0.69 05/18/2024 1331   CREATININE 0.76 11/07/2012 0517   CALCIUM 8.4 (L) 05/18/2024 1331   CALCIUM 7.9 (L) 11/07/2012 0517   GFRNONAA >60 05/18/2024 1331   GFRNONAA >60 11/07/2012 0517   GFRAA >60 02/17/2020 0000   GFRAA >60 11/07/2012 0517   Estimated Creatinine Clearance: 97.3 mL/min (by C-G formula based on SCr of 0.69 mg/dL).  COAG Lab Results  Component  Value Date   INR 1.03 04/03/2016    Radiology No results found.   Assessment/Plan 1. Deep vein thrombosis (DVT) of proximal lower extremity, unspecified chronicity, unspecified laterality (HCC) (Primary) The patient will continue anticoagulation for now as there have not been any problems or complications at this point.  IVC filter is strongly indicated prior to high risk orthopedic surgery.  Especially given the history of PE / DVT.   IVC filter placement will be done the week for surgery. Risk and benefits were reviewed the patient.  Indications for the procedure were reviewed.  All questions were answered, the patient agrees to proceed.    Elevation was stressed, such as the use of a recliner.   I have reviewed with the patient DVT and post phlebitic changes such as swelling and why it  causes symptoms such as pain.  I recommended to the patient to wear graduated compression stockings, beginning after three full days of anticoagulation.  Graduated compression should be worn on a daily basis. The patient should wear compression beginning first thing in the morning and removing them in the evening. The patient is instructed specifically not to sleep in the stockings.  In addition, behavioral modification including elevation during the day and avoidance of prolonged dependency will be initiated.     The patient will follow-up with me 6-8 weeks after the joint replacement surgery to discuss removal (this was also discussed today and the patient agrees with the plan to have the filter removed).    Cordella Shawl, MD  05/19/2024 8:25 AM

## 2024-05-19 NOTE — Progress Notes (Signed)
 MRN : [id number]  Alexis Christensen is a 53 y.o. (December 31, 1970) female who presents with chief complaint of legs hurt and swell.  History of Present Illness:   The patient presents to [facility] today for placement of an IVC filter prior to joint placement surgery.   DVT was identified years ago and was treated with anticoagulation.  The presenting symptoms were pain and swelling in the lower extremity.  She has been having significant knee pain following injuries years ago and at this point her orthopedic surgeon has recommended placement of an IVC filter given her previous history of right lower extremity DVT years ago prior to surgery for her knee.  The patient has been using compression therapy at this point.   No SOB or pleuritic chest pains.  No cough or hemoptysis.   The patient is currently not on anticoagulation  Current Meds  Medication Sig   albuterol  (VENTOLIN  HFA) 108 (90 Base) MCG/ACT inhaler Inhale 1 puff into the lungs every 6 (six) hours as needed for wheezing or shortness of breath.   ARIPiprazole (ABILIFY) 30 MG tablet Take 30 mg by mouth daily.   aspirin 81 MG chewable tablet Aspirin 81 MG Oral Tablet Chewable QTY: 30 tablet Days: 30 Refills: 0  Written: 01/23/21 Patient Instructions: once a day   Budeson-Glycopyrrol-Formoterol (BREZTRI AEROSPHERE) 160-9-4.8 MCG/ACT AERO Inhale 2 puffs into the lungs in the morning and at bedtime.   celecoxib (CELEBREX) 100 MG capsule Take 100 mg by mouth 2 (two) times daily.   colestipol  (COLESTID ) 1 g tablet TAKE 2 TABLETS(2 GRAMS) BY MOUTH TWICE DAILY   dicyclomine (BENTYL) 10 MG capsule TAKE 1 CAPSULE BY MOUTH EVERY 8 HOURS AS NEEDED FOR ABDOMINAL PAIN   DULoxetine (CYMBALTA) 60 MG capsule Take 60 mg by mouth 2 (two) times daily.   esomeprazole  (NEXIUM ) 40 MG capsule Take 1 capsule (40 mg total) by mouth 2 (two) times daily before a meal.   levothyroxine  (SYNTHROID ) 200 MCG tablet Take 200 mcg by mouth  daily before breakfast.   liothyronine (CYTOMEL) 5 MCG tablet Take 10 mcg by mouth daily.   lisinopril  (ZESTRIL ) 10 MG tablet Take 1 tablet (10 mg total) by mouth daily.   metoprolol tartrate (LOPRESSOR) 50 MG tablet Take 50 mg by mouth daily in the afternoon.   nortriptyline (PAMELOR) 10 MG capsule Take 10 mg in the morning   potassium chloride  SA (KLOR-CON  M) 20 MEQ tablet Take 1 tablet (20 mEq total) by mouth 2 (two) times daily.   rosuvastatin (CRESTOR) 40 MG tablet Take 40 mg by mouth daily.   spironolactone (ALDACTONE) 25 MG tablet TAKE 1/2 (HALF) TABLET BY MOUTH ONCE DAILY.   triamcinolone  cream (KENALOG ) 0.1 % Apply 1 Application topically 2 (two) times daily.    Past Medical History:  Diagnosis Date   (HFpEF) heart failure with preserved ejection fraction (HCC)    Acute deep vein thrombosis (DVT) of right lower extremity (HCC) 2016   Acute non-recurrent maxillary sinusitis 07/16/2023   ADHD    Adopted    ANA positive    Anemia    Anxiety    Arthritis    Asthma    B12 deficiency    Bilateral hand pain    Bilateral swelling of feet and ankles    Bipolar 1 disorder (HCC)    Carpal tunnel syndrome of left wrist    Chest pain    Chronic diarrhea 09/07/2015   Chronic left  shoulder pain    Clostridioides difficile infection 04/06/2019   Depression    Diverticulosis    Edema leg    Effusion of knee 12/30/2013   GERD (gastroesophageal reflux disease)    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 pylori) infection 08/24/2015   Hiatal hernia    History of 2019 novel coronavirus disease (COVID-19) 09/09/2020   a.) PCR testing (+): 09/09/2020, 01/12/2021, 07/17/2021   HLD (hyperlipidemia)    Hx MRSA infection    Hypothyroidism    Influenza A 10/01/2022   Left wrist sprain, sequela 01/14/2023   Lipoma of arm 05/11/2013   Lower extremity edema    Lumbago    Lymphedema 04/26/2016   Migraine with aura    Multinodular goiter     Nephrolithiasis    Plantar fascial fibromatosis    Pneumonia due to COVID-19 virus    Post traumatic stress disorder (PTSD)    Pre-diabetes    PVD (peripheral vascular disease)    Seasonal allergies    Shortness of breath on exertion    Sleep apnea    Stress incontinence    Swelling of limb 04/26/2016   Urge incontinence    Venous insufficiency    Venous ulcer (HCC)    Vitamin D  deficiency    Weight loss, unintentional 09/07/2015   Wound infection, posttraumatic 01/14/2023    Past Surgical History:  Procedure Laterality Date   ABDOMINAL HYSTERECTOMY     ANKLE SURGERY     CHOLECYSTECTOMY     COLONOSCOPY WITH PROPOFOL  N/A 06/24/2019   Procedure: COLONOSCOPY WITH PROPOFOL ;  Surgeon: Toledo, Ladell POUR, MD;  Location: [facility];  Service: Gastroenterology;  Laterality: N/A;   CYSTOSCOPY N/A 04/09/2016   Procedure: CYSTOSCOPY;  Surgeon: Glendia Elizabeth, MD;  Location: [facility];  Service: Urology;  Laterality: N/A;   DILATION AND CURETTAGE OF UTERUS     ESOPHAGOGASTRODUODENOSCOPY (EGD) WITH PROPOFOL  N/A 06/24/2019   Procedure: ESOPHAGOGASTRODUODENOSCOPY (EGD) WITH PROPOFOL ;  Surgeon: Toledo, Ladell POUR, MD;  Location: [facility];  Service: Gastroenterology;  Laterality: N/A;   ESOPHAGOGASTRODUODENOSCOPY (EGD) WITH PROPOFOL  N/A 10/01/2023   Procedure: ESOPHAGOGASTRODUODENOSCOPY (EGD) WITH PROPOFOL ;  Surgeon: Therisa Bi, MD;  Location: [facility] ENDOSCOPY;  Service: Gastroenterology;  Laterality: N/A;   INGUINAL HERNIA REPAIR Bilateral 04/18/2017   Procedure: LAPAROSCOPIC BILATERAL INGUINAL HERNIA REPAIR;  Surgeon: Jordis Laneta FALCON, MD;  Location: [facility];  Service: General;  Laterality: Bilateral;   INTERSTIM IMPLANT PLACEMENT N/A 11/26/2016   Procedure: RENNA IMPLANT FIRST STAGE;  Surgeon: Elizabeth Glendia, MD;  Location: [facility];  Service: Urology;  Laterality: N/A;   INTERSTIM IMPLANT PLACEMENT N/A 11/26/2016   Procedure: RENNA IMPLANT SECOND STAGE;  Surgeon: Elizabeth Glendia, MD;  Location: [facility];  Service: Urology;  Laterality: N/A;   INTERSTIM IMPLANT PLACEMENT N/A 02/18/2023   Procedure: RENNA IMPLANT FIRST STAGE WITH IMPEDANCE CHECK;  Surgeon: Elizabeth Glendia, MD;  Location: [facility];  Service: Urology;  Laterality: N/A;   INTERSTIM IMPLANT PLACEMENT N/A 02/18/2023   Procedure: RENNA IMPLANT SECOND STAGE;  Surgeon: Elizabeth Glendia, MD;  Location: [facility];  Service: Urology;  Laterality: N/A;   INTERSTIM IMPLANT REMOVAL N/A 02/18/2023   Procedure: REMOVAL OF INTERSTIM IMPLANT;  Surgeon: Elizabeth Glendia, MD;  Location: [facility];  Service: Urology;  Laterality: N/A;   JOINT REPLACEMENT Right    knee   KNEE ARTHROSCOPY Bilateral    KNEE ARTHROSCOPY WITH LATERAL RELEASE Left 10/18/2015   Procedure: KNEE ARTHROSCOPY LATERAL AND PARTIAL  SYNOVECTOMY;  Surgeon: Ozell Flake, MD;  Location: [facility];  Service: Orthopedics;  Laterality: Left;   Lymph Node removal  2015   Neck   PUBOVAGINAL SLING N/A 04/09/2016   Procedure: PUBO-VAGINAL SLING/ RETROPUBIC SLING;  Surgeon: Glendia Elizabeth, MD;  Location: [facility];  Service: Urology;  Laterality: N/A;   REPLACEMENT TOTAL KNEE Right    SHOULDER SURGERY Right 2014   TUBAL LIGATION     WRIST SURGERY Right    metal plate    Social History Social History   Tobacco Use   Smoking status: Former    Current packs/day: 0.00    Average packs/day: 0.5 packs/day for 11.0 years (5.5 ttl pk-yrs)    Types: Cigarettes    Start date: 07/16/2008    Quit date: 07/16/2019    Years since quitting: 4.8    Passive exposure: Past   Smokeless tobacco: Never   Tobacco comments:    STRESS RELATED  Vaping Use   Vaping status: Never Used  Substance Use Topics   Alcohol use: Not Currently    Comment: Hisotry of ETOH abouse ASB patient   Drug use: Not Currently    Comment: Pt listed history of abuse of prescription drugs    Family History Family History  Adopted: Yes  Family history unknown: Yes    Allergies   Allergen Reactions   Geodon [Ziprasidone Hydrochloride] Other (See Comments)    Numbness ,sob, headaches, blurred vision   Sulfacetamide Sodium Hives   Ziprasidone Anaphylaxis and Other (See Comments)    Other reaction(s): Numbness ,sob, headaches, blurred vision   Ace Inhibitors Rash   Erythromycin Rash and Hives   Hydromorphone Rash   Lamictal [Lamotrigine] Rash    Other reaction(s): Unknown   Strawberry (Diagnostic) Rash   Sulfa Antibiotics Hives and Rash     REVIEW OF SYSTEMS (Negative unless checked)  Constitutional: [] Weight loss  [] Fever  [] Chills Cardiac: [] Chest pain   [] Chest pressure   [] Palpitations   [] Shortness of breath when laying flat   [] Shortness of breath with exertion. Vascular:  [] Pain in legs with walking   [x] Pain in legs at rest  [x] History of DVT   [] Phlebitis   [x] Swelling in legs   [] Varicose veins   [] Non-healing ulcers Pulmonary:   [] Uses home oxygen   [] Productive cough   [] Hemoptysis   [] Wheeze  [] COPD   [] Asthma Neurologic:  [] Dizziness   [] Seizures   [] History of stroke   [] History of TIA  [] Aphasia   [] Vissual changes   [] Weakness or numbness in arm   [] Weakness or numbness in leg Musculoskeletal:   [] Joint swelling   [x] Joint pain   [] Low back pain Hematologic:  [] Easy bruising  [] Easy bleeding   [] Hypercoagulable state   [] Anemic Gastrointestinal:  [] Diarrhea   [] Vomiting  [] Gastroesophageal reflux/heartburn   [] Difficulty swallowing. Genitourinary:  [] Chronic kidney disease   [] Difficult urination  [] Frequent urination   [] Blood in urine Skin:  [] Rashes   [] Ulcers  Psychological:  [] History of anxiety   []  History of major depression.  Physical Examination  Vitals:   05/19/24 0725  BP: 123/84  Pulse: 67  Resp: 19  Temp: (!) 97.4 F (36.3 C)  TempSrc: Temporal  SpO2: 95%  Weight: 111.9 kg  Height: 5' 4 (1.626 m)   Body mass index is 42.36 kg/m. Gen: WD/WN, NAD Head: [place]/AT, No temporalis wasting.  Ear/Nose/Throat: Hearing grossly  intact, nares w/o erythema or drainage, pinna without lesions Eyes: PER, EOMI, sclera nonicteric.  Neck: Supple, no  gross masses.  No JVD.  Pulmonary:  Good air movement, no audible wheezing, no use of accessory muscles.  Cardiac: RRR, precordium not hyperdynamic. Vascular:  scattered varicosities present bilaterally.  Moderate venous stasis changes to the legs bilaterally.  Trace soft pitting edema. CEAP C4sEpAsPr   Vessel Right Left  Radial Palpable Palpable  Gastrointestinal: soft, non-distended. No guarding/no peritoneal signs.  Musculoskeletal: M/S 5/5 throughout.  No deformity.  Neurologic: CN 2-12 intact. Pain and light touch intact in extremities.  Symmetrical.  Speech is fluent. Motor exam as listed above. Psychiatric: Judgment intact, Mood & affect appropriate for pt's clinical situation. Dermatologic: Venous rashes no ulcers noted.  No changes consistent with cellulitis. Lymph : No lichenification or skin changes of chronic lymphedema.  CBC Lab Results  Component Value Date   WBC 9.0 05/18/2024   HGB 11.1 (L) 05/18/2024   HCT 34.0 (L) 05/18/2024   MCV 85.9 05/18/2024   PLT 283 05/18/2024    BMET    Component Value Date/Time   NA 136 05/18/2024 1331   NA 143 09/27/2023 0939   NA 138 11/07/2012 0517   K 3.4 (L) 05/18/2024 1331   K 3.9 11/07/2012 0517   CL 100 05/18/2024 1331   CL 109 (H) 11/07/2012 0517   CO2 26 05/18/2024 1331   CO2 25 11/07/2012 0517   GLUCOSE 117 (H) 05/18/2024 1331   GLUCOSE 91 11/07/2012 0517   BUN 15 05/18/2024 1331   BUN 12 09/27/2023 0939   BUN 7 11/07/2012 0517   CREATININE 0.69 05/18/2024 1331   CREATININE 0.76 11/07/2012 0517   CALCIUM 8.4 (L) 05/18/2024 1331   CALCIUM 7.9 (L) 11/07/2012 0517   GFRNONAA >60 05/18/2024 1331   GFRNONAA >60 11/07/2012 0517   GFRAA >60 02/17/2020 0000   GFRAA >60 11/07/2012 0517   Estimated Creatinine Clearance: 97.3 mL/min (by C-G formula based on SCr of 0.69 mg/dL).  COAG Lab Results  Component  Value Date   INR 1.03 04/03/2016    Radiology No results found.   Assessment/Plan 1. Deep vein thrombosis (DVT) of proximal lower extremity, unspecified chronicity, unspecified laterality (HCC) (Primary) The patient will continue anticoagulation for now as there have not been any problems or complications at this point.  IVC filter is strongly indicated prior to high risk orthopedic surgery.  Especially given the history of PE / DVT.   IVC filter placement will be done the week for surgery. Risk and benefits were reviewed the patient.  Indications for the procedure were reviewed.  All questions were answered, the patient agrees to proceed.    Elevation was stressed, such as the use of a recliner.   I have reviewed with the patient DVT and post phlebitic changes such as swelling and why it  causes symptoms such as pain.  I recommended to the patient to wear graduated compression stockings, beginning after three full days of anticoagulation.  Graduated compression should be worn on a daily basis. The patient should wear compression beginning first thing in the morning and removing them in the evening. The patient is instructed specifically not to sleep in the stockings.  In addition, behavioral modification including elevation during the day and avoidance of prolonged dependency will be initiated.     The patient will follow-up with me 6-8 weeks after the joint replacement surgery to discuss removal (this was also discussed today and the patient agrees with the plan to have the filter removed).    Cordella Shawl, MD  05/19/2024 8:25 AM

## 2024-05-20 ENCOUNTER — Encounter: Payer: Self-pay | Admitting: Urgent Care

## 2024-05-20 ENCOUNTER — Encounter
Admission: RE | Admit: 2024-05-20 | Discharge: 2024-05-20 | Disposition: A | Discharge status: Home / Self Care | Source: Ambulatory Visit | Attending: Orthopedic Surgery | Admitting: Orthopedic Surgery

## 2024-05-20 DIAGNOSIS — R8271 Bacteriuria: Secondary | ICD-10-CM | POA: Insufficient documentation

## 2024-05-20 DIAGNOSIS — H109 Unspecified conjunctivitis: Secondary | ICD-10-CM

## 2024-05-20 DIAGNOSIS — R8290 Unspecified abnormal findings in urine: Secondary | ICD-10-CM | POA: Insufficient documentation

## 2024-05-20 DIAGNOSIS — M1712 Unilateral primary osteoarthritis, left knee: Secondary | ICD-10-CM

## 2024-05-20 DIAGNOSIS — Z01812 Encounter for preprocedural laboratory examination: Secondary | ICD-10-CM

## 2024-05-20 DIAGNOSIS — R8281 Pyuria: Secondary | ICD-10-CM | POA: Insufficient documentation

## 2024-05-20 DIAGNOSIS — Z01818 Encounter for other preprocedural examination: Secondary | ICD-10-CM | POA: Diagnosis present

## 2024-05-20 DIAGNOSIS — B9620 Unspecified Escherichia coli [E. coli] as the cause of diseases classified elsewhere: Secondary | ICD-10-CM

## 2024-05-20 LAB — URINE CULTURE

## 2024-05-21 LAB — URINE CULTURE: Culture: NO GROWTH

## 2024-05-21 MED ORDER — NITROFURANTOIN MONOHYD MACRO 100 MG PO CAPS: 100.0000 mg | ORAL_CAPSULE | Freq: Two times a day (BID) | ORAL | 0 refills | Status: AC

## 2024-05-21 MED ORDER — POLYMYXIN B-TRIMETHOPRIM 10000-0.1 UNIT/ML-% OP SOLN: OPHTHALMIC | 0 refills | Status: AC

## 2024-05-21 NOTE — Progress Notes (Signed)
 [facility] Perioperative Services: Pre-Admission/Anesthesia Testing  Abnormal Lab Notification and Treatment Plan of Care   Date: 05/21/24  Name: Alexis Christensen DOB: July 09, 1971 MRN:   [id number]  Re: Abnormal labs noted during PAT appointment   Notified:  Provider Name Provider Role Notification Mode  .Lorelle Hussar, MD Orthopedics (Surgeon) Routed and/or faxed via [facility]   Abnormal Lab Value(s):   Lab Results  Component Value Date   COLORURINE YELLOW (A) 05/18/2024   APPEARANCEUR HAZY (A) 05/18/2024   LABSPEC 1.026 05/18/2024   PHURINE 5.0 05/18/2024   GLUCOSEU NEGATIVE 05/18/2024   HGBUR NEGATIVE 05/18/2024   BILIRUBINUR NEGATIVE 05/18/2024   KETONESUR NEGATIVE 05/18/2024   PROTEINUR NEGATIVE 05/18/2024   UROBILINOGEN 0.2 02/19/2024   NITRITE NEGATIVE 05/18/2024   LEUKOCYTESUR LARGE (A) 05/18/2024   EPIU 6-10 05/18/2024   WBCU 21-50 05/18/2024   RBCU 0-5 05/18/2024   BACTERIA RARE (A) 05/18/2024   CULT MULTIPLE SPECIES PRESENT, SUGGEST RECOLLECTION (A) 05/18/2024    Clinical Information and Notes:  Patient is scheduled for LEFT TOTAL KNEE ARTHROPLASTY on 05/28/2024.    UA performed in PAT consistent with/concerning for infection.  No leukocytosis noted on CBC; WBC 9.0 Renal function: Estimated Creatinine Clearance: 97.3 mL/min (by C-G formula based on SCr of 0.69 mg/dL). Urine C&S added to assess for pathogenically significant growth.  Impression and Plan:  Alexis Christensen with a UA that was (+) for infection; reflex culture sent. Initial culture resulted as contaminated.  Patient was brought back into the office on 05/20/2024 for recollect.  Patient contacted the office on the morning of 05/21/2024 advising that she had developed  mild dysuria and severe lower back pain overnight.  She was requesting the results for her urine testing.  Patient made aware that testing will not be back until later today.  Given the patient's symptomatic and her  surgery is coming up soon, I gave patient the option of proceeding with empiric coverage with antimicrobial therapy versus waiting until final culture comes back.  She was made aware that if culture demonstrates resistance, she will have to go to the pharmacy and pick up another prescription.  Patient wishes to proceed.  Rx sent in as follows:  Allergies reviewed. Culture report also reviewed to ensure culture appropriate coverage is being provided. Will treat with a 5 day course of NITROFURANTOIN . Patient encouraged to complete the entire course of antibiotics even if she begins to feel better. She was advised that if culture demonstrates resistance to the prescribed antibiotic, she will be contacted and advised of the need to change the antibiotic being used to treat her infection.   Meds ordered this encounter  Medications   nitrofurantoin , macrocrystal-monohydrate, (MACROBID ) 100 MG capsule    Sig: Take 1 capsule (100 mg total) by mouth 2 (two) times daily for 5 days. Increase WATER intake while taking this medication.    Dispense:  10 capsule    Refill:  0    Please contact the patient as soon as it is available for pickup. Rx is for preoperative UTI treatment and needs to be started ASAP.   Patient encouraged to increase her fluid intake as much as possible. Discussed that water is always best to flush the urinary tract. She was advised to avoid caffeine containing fluids until her infections clears, as caffeine can cause her to experience painful bladder spasms.   May use Tylenol  as needed for pain/fever should she experience these symptoms. May also use over the counter phenazopyridine   to help relieve her current urinary pain.   Patient instructed to call surgeon's office or PAT with any questions or concerns related to the above outlined course of treatment. Additionally, she was instructed to call if she feels like she is getting worse overall while on treatment. Results and treatment plan  of care forwarded to primary attending surgeon to make them aware.   Encounter Diagnoses  Name Primary?   Pre-operative laboratory examination Yes   E. coli UTI (urinary tract infection)    Dorise Pereyra, MSN, APRN, FNP-C, CEN [facility]  Perioperative Services Nurse Practitioner Phone: [phone] Fax: [phone] 05/21/24 8:34 AM  NOTE: This note has been prepared using Dragon dictation software. Despite my best ability to proofread, there is always the potential that unintentional transcriptional errors may still occur from this process.

## 2024-05-21 NOTE — Progress Notes (Signed)
  Perioperative Services Pre-Admission/Anesthesia Testing    Date: 05/21/24  Name: Alexis Christensen DOB: June 21, 1971 MRN:   [id number]  Re: Plans for surgery; I concern  Planned Surgical Procedure(s):     Case: [id number] Date/Time: 05/28/24 1233   Procedure: ARTHROPLASTY, KNEE, TOTAL (Left: Knee)   Anesthesia type: Choice   Diagnosis:      Primary localized osteoarthritis of left knee [M17.12]     Complex tear of medial meniscus of left knee as current injury, initial encounter [S83.232A]   Pre-op diagnosis:      Primary localized osteoarthritis of left knee M17.12     Complex tear of medial meniscus of left knee as current injury, initial encounter S83.232A   Location: [facility] OR ROOM 01 / [facility]   Surgeons: Lorelle Hussar, MD        Clinical Notes:  Patient is scheduled for the above procedure on 05/28/2024 with Dr. Hussar Lorelle, MD.   Patient contacted the PAT clinic on 05/21/2024 with complaints of pain (burning), scleral erythema, photophobia, and excessive tearing from her LEFT eye. Symptoms causing her to experience blurred vision. Patient reporting that she woke up this morning with the symptoms.  Patient does not wear contacts. She has no fevers. Patient denies any injury to her eye; no excessive rubbing.   She had plans to go in and be seen today today, however given that I am sending her new prescriptions for a UTI, patient asking if prescription can be sent for her eye as well. In efforts to ensure that surgery is able to take place without delay, will send in prescription for ophthalmic drops in order to cover what sounds like bacterial conjunctivitis.  Sending Rx to patient's pharmacy of record as follows:  Medications   trimethoprim -polymyxin b  (POLYTRIM ) ophthalmic solution    Sig: Place 2 drops into the left eye every 4 (four) hours while awake x 5 days    Dispense:  10 mL    Refill:  0   Encounter Diagnoses  Name Primary?   Bacterial  conjunctivitis of left eye    Dorise Pereyra, MSN, APRN, FNP-C, CEN [facility]  Perioperative Services Nurse Practitioner Phone: [phone] Fax: [phone] 05/21/24 9:40 AM  NOTE: This note has been prepared using Dragon dictation software. Despite my best ability to proofread, there is always the potential that unintentional transcriptional errors may still occur from this process.

## 2024-05-24 ENCOUNTER — Other Ambulatory Visit: Payer: Self-pay | Admitting: Family

## 2024-05-24 ENCOUNTER — Other Ambulatory Visit: Payer: Self-pay | Admitting: Cardiovascular Disease

## 2024-05-24 DIAGNOSIS — I340 Nonrheumatic mitral (valve) insufficiency: Secondary | ICD-10-CM

## 2024-05-24 DIAGNOSIS — E782 Mixed hyperlipidemia: Secondary | ICD-10-CM

## 2024-05-24 DIAGNOSIS — R0602 Shortness of breath: Secondary | ICD-10-CM

## 2024-05-24 DIAGNOSIS — I5030 Unspecified diastolic (congestive) heart failure: Secondary | ICD-10-CM

## 2024-05-25 ENCOUNTER — Encounter: Payer: Self-pay | Admitting: Urgent Care

## 2024-05-25 DIAGNOSIS — G4733 Obstructive sleep apnea (adult) (pediatric): Secondary | ICD-10-CM

## 2024-05-25 NOTE — Progress Notes (Signed)
 Perioperative Services Pre-Admission/Anesthesia Testing    Date: 05/23/24  Name: Alexis Christensen DOB: [birth year] MRN:   [id number]  Re: Postprocedural pain following IVC placement  Planned Surgical Procedure(s):     Case: [id number] Date/Time: 05/28/24 1233   Procedure: ARTHROPLASTY, KNEE, TOTAL (Left: Knee)   Anesthesia type: Choice   Diagnosis:      Primary localized osteoarthritis of left knee [M17.12]     Complex tear of medial meniscus of left knee as current injury, initial encounter [S83.232A]   Pre-op diagnosis:      Primary localized osteoarthritis of left knee M17.12     Complex tear of medial meniscus of left knee as current injury, initial encounter S83.232A   Location: [facility] OR ROOM 01 / [facility]   Surgeons: Lorelle Hussar, MD        Clinical Notes:  Patient is scheduled for the above procedure on 05/28/2024 with Dr. Hussar Lorelle, MD. In preparation for this procedure, patient underwent IVC placement on 05/20/2023.  In preparation for her procedure, patient presented to the PAT clinic for preoperative testing.  Patient with abnormal UA suggesting possible infection, however initial sample was contaminated.  Patient came back in for repeat sample.  Patient contacted the clinic the following day advising that her back and started hurting and she was requesting empiric coverage for possible UTI.  Rx for antimicrobial prescription was sent in with the caveat being that if culture demonstrated resistance we would have to change therapy.  Patient amenable to proceeding.  Patient contacted me on 05/23/2024 via MyChart advising that she continues to experience significant back pain. Patient was questioning other potential causes.  Repeat culture was reviewed and patient did not have any significant growth.  She was advised that she could discontinue antimicrobial therapy.  Given her recent procedure with vascular, I advised her that I would reach out to the  vascular surgeons to discuss.  While rare, there is the potential for complications associated with IVC filter placement.  Including such things as occlusion, fracture, migration, and penetration, with back pain being a presenting symptom and up to 38% of penetration cases (Lause et al., 2018).  Received communication back from Dr. Marea advising that patient's sometimes will experience back pain following IVC filter placement for weeks after device is placed, especially if it was a new device.  In the setting of a more remote placement, CT imaging with contrast would be indicated to evaluate the filter for such incidental findings as leg of the stent being outside of the cava, which generally does not cause symptoms.    Patient was contacted to discuss the information received from Dr. Marea.  She was advised that if pain was significant or worsening, she would be better served in the ER for further evaluation with the aforementioned CT imaging.  If pain is relatively stable, patient encouraged to contact vascular surgeons office on Monday to discuss if symptoms are persistent.  Patient verbalized understanding.  Return communication sent to Dr. Marea and Dr. Jama to make them aware of follow-up conversation with patient.  No further needs from the PAT department identified at this time.  Citation:  [facility] Journal of Medicine Nov 2018, 85 (11) 835-836; DOI: 10.3949/[email or web address].[email or web address]   Dorise Pereyra, MSN, APRN, FNP-C, CEN [facility]  Perioperative Services Nurse Practitioner Phone: [phone] Fax: [phone]  NOTE: This note has been prepared using Dragon dictation software. Despite my best ability to proofread, there is always the  potential that unintentional transcriptional errors may still occur from this process.

## 2024-05-25 NOTE — Progress Notes (Signed)
 Perioperative / Anesthesia Services  Pre-Admission Testing Clinical Review / Pre-Operative Anesthesia Consult  Date: 05/26/24  PATIENT DEMOGRAPHICS: Name: Alexis Christensen DOB: 09/28/1970 MRN:   [id number]  Note: Available PAT nursing documentation and vital signs have been reviewed. Clinical nursing staff has updated patient's PMH/PSHx, current medication list, and drug allergies/intolerances to ensure complete and comprehensive history available to assist care teams in MDM as it pertains to the aforementioned surgical procedure and anticipated anesthetic course. Extensive review of available clinical information personally performed. Nursing documentation reviewed. [place] PMH and PSHx updated with any diagnoses and/or procedures that I have knowledge of that may have been inadvertently omitted during her intake with the pre-admission testing department's nursing staff.  PLANNED SURGICAL PROCEDURE(S):   Case: [id number] Date/Time: 05/28/24 1233   Procedure: ARTHROPLASTY, KNEE, TOTAL (Left: Knee)   Anesthesia type: Choice   Diagnosis:      Primary localized osteoarthritis of left knee [M17.12]     Complex tear of medial meniscus of left knee as current injury, initial encounter [S83.232A]   Pre-op diagnosis:      Primary localized osteoarthritis of left knee M17.12     Complex tear of medial meniscus of left knee as current injury, initial encounter S83.232A   Location: [facility] OR ROOM 01 / [facility]   Surgeons: Lorelle Hussar, MD        CLINICAL DISCUSSION: Alexis Christensen is a 53 y.o. female who is submitted for pre-surgical anesthesia review and clearance prior to her undergoing the above procedure. Patient is a Former Smoker (5.5 pack years; quit 06/2019). Pertinent PMH includes: HFpEF, DVT, PVD, cardiac murmur, chest pain, HTN, HLD, hypothyroidism, DOE, asthma, OSAH (on nocturnal PAP therapy), GERD (on daily PPI), hiatal hernia, venous insufficiency, lymphedema,  anemia, nephrolithiasis, lumbago, OA, anxiety, depression, bipolar 1 disorder, opioid dependence (on buprenorphine).  Patient is followed by cardiology Orvil, MD). She was last seen in the cardiology clinic on 05/26/2024; notes reviewed. At the time of her clinic visit, patient doing well overall from a cardiovascular perspective. Patient denied any chest pain, shortness of breath, PND, orthopnea, palpitations, significant peripheral edema, weakness, fatigue, vertiginous symptoms, or presyncope/syncope. Patient with a past medical history significant for cardiovascular diagnoses. Documented physical exam was grossly benign, providing no evidence of acute exacerbation and/or decompensation of the patient's known cardiovascular conditions.  Most recent TTE performed on 02/23/2024 revealed a normal left ventricular systolic function with an EF of >55%. There was no LVH.  There were no regional wall motion abnormalities. Left ventricular diastolic Doppler parameters consistent with abnormal relaxation (G1DD).  Left atrium is mildly dilated.  Right ventricular size and function normal. There was trivial pulmonary, tricuspid, and mitral valve regurgitation.  All transvalvular gradients were noted to be normal providing no evidence of hemodynamically significant valvular stenosis. Aorta normal in size with no evidence of ectasia or aneurysmal dilatation.  Coronary CTA was performed on 06/12/2022 that demonstrated an Agatston coronary artery calcium score of 0. .  Study demonstrated normal coronary origin with LEFT dominance.  Blood pressure well controlled at 114/64 mmHg on currently prescribed ACEi (lisinopril ), beta-blocker (metoprolol tartrate), and MRA (spironolactone) therapies.  Patient is on rosuvastatin for her HLD diagnosis and ASCVD prevention. Patient is not diabetic. She does have an OSAH diagnosis and is reported to be compliant with prescribed nocturnal PAP therapy. Patient is able to complete all of  her  ADL/IADLs without cardiovascular limitation.  Per the DASI, patient is able to achieve at least  4 METS of physical activity without experiencing any significant degree of angina/anginal equivalent symptoms. No changes were made to her medication regimen during her visit with cardiology.  Patient to follow-up with outpatient cardiology in 6 weeks with repeat echo and visit with provider for ongoing management of her known cardiovascular diagnoses.   Alexis Christensen is scheduled for an elective ARTHROPLASTY, KNEE, TOTAL (Left: Knee) on 05/28/2024 with Dr. Arthea Sheer, MD. Given patient's past medical history significant for cardiovascular diagnoses, presurgical cardiac clearance was sought by the PAT team. Per cardiology, this patient is optimized for surgery and may proceed with the planned procedural course with a LOW risk of significant perioperative cardiovascular complications.  In review of the patient's medication reconciliation, it is noted that she is on daily oral antithrombotic therapy. Given that patient's past medical history is significant for cardiovascular diagnoses, including but not limited to CAD, orthopedics has cleared patient to continue her daily low dose ASA throughout her perioperative course.  Patient has been updated on these directives from her specialty care providers by the PAT team.  Patient denies previous perioperative complications with anesthesia in the past. In review her EMR, it is noted that patient underwent a general anesthetic course here at [facility] (ASA III) in 09/2023 without documented complications.   MOST RECENT VITAL SIGNS:    05/26/2024    2:13 PM 05/19/2024   10:00 AM 05/19/2024    9:45 AM  Vitals with BMI  Height 5' 4    Weight 245 lbs    BMI 42.03    Systolic 114 121 871  Diastolic 64 75 73  Pulse 59 59 60   PROVIDERS/SPECIALISTS: NOTE: Primary physician provider listed below. Patient may have been  seen by APP or partner within same practice.   PROVIDER ROLE / SPECIALTY LAST SHERLEAN Sheer Arthea, MD Orthopedics (Surgeon) 05/13/2024   Orlean Alan HERO, FNP Primary Care Provider 04/30/2024  Fernand Alter, MD Cardiology 04/11/2023  Jama Hacker, MD Vascular Surgery 04/06/2024  Cherilyn Ned, MD Endocrinology 12/26/2023  Theotis Chancellor, MD Pulmonary Medicine 09/03/2023   ALLERGIES: Allergies  Allergen Reactions   Geodon [Ziprasidone Hydrochloride] Other (See Comments)    Numbness ,sob, headaches, blurred vision   Sulfacetamide Sodium Hives   Ziprasidone Anaphylaxis and Other (See Comments)    Other reaction(s): Numbness ,sob, headaches, blurred vision   Ace Inhibitors Rash   Erythromycin Rash and Hives   Hydromorphone Rash   Lamictal [Lamotrigine] Rash    Other reaction(s): Unknown   Strawberry (Diagnostic) Rash   Sulfa Antibiotics Hives and Rash    CURRENT HOME MEDICATIONS: No current facility-administered medications for this encounter.    albuterol  (VENTOLIN  HFA) 108 (90 Base) MCG/ACT inhaler   ARIPiprazole (ABILIFY) 30 MG tablet   aspirin 81 MG chewable tablet   Budeson-Glycopyrrol-Formoterol (BREZTRI AEROSPHERE) 160-9-4.8 MCG/ACT AERO   celecoxib (CELEBREX) 100 MG capsule   colestipol  (COLESTID ) 1 g tablet   dicyclomine (BENTYL) 10 MG capsule   DULoxetine (CYMBALTA) 60 MG capsule   esomeprazole  (NEXIUM ) 40 MG capsule   gabapentin  (NEURONTIN ) 600 MG tablet   hydrOXYzine  (ATARAX /VISTARIL ) 25 MG tablet   levothyroxine  (SYNTHROID ) 200 MCG tablet   liothyronine (CYTOMEL) 5 MCG tablet   lisinopril  (ZESTRIL ) 10 MG tablet   metoprolol tartrate (LOPRESSOR) 50 MG tablet   nitrofurantoin , macrocrystal-monohydrate, (MACROBID ) 100 MG capsule   nortriptyline (PAMELOR) 10 MG capsule   nystatin  powder   rosuvastatin (CRESTOR) 40 MG tablet   spironolactone (ALDACTONE) 25 MG  tablet   triamcinolone  cream (KENALOG ) 0.1 %   trimethoprim -polymyxin b  (POLYTRIM ) ophthalmic  solution   HISTORY: Past Medical History:  Diagnosis Date   (HFpEF) heart failure with preserved ejection fraction (HCC)    Acute deep vein thrombosis (DVT) of right lower extremity (HCC) 2016   ADHD    Adopted    ANA positive    Anemia    Anxiety    Arthritis    Asthma    B12 deficiency    Bilateral swelling of feet and ankles    Bipolar 1 disorder (HCC)    Carpal tunnel syndrome of left wrist    Chest pain    Chronic diarrhea 09/07/2015   Chronic left shoulder pain    Clostridioides difficile infection 04/06/2019   Depression    Diverticulosis    Edema leg    GERD (gastroesophageal reflux disease)    Heart murmur    Helicobacter pylori (H. pylori) infection 08/24/2015   Hiatal hernia    History of 2019 novel coronavirus disease (COVID-19) 09/09/2020   a.) PCR testing (+): 09/09/2020, 01/12/2021, 07/17/2021   HLD (hyperlipidemia)    HTN (hypertension)    Hx MRSA infection    Hypothyroidism    Influenza A 10/01/2022   Lipoma of arm 05/11/2013   Lower extremity edema    Lumbago    Lymphedema 04/26/2016   Medial meniscus tear (LEFT)    Migraine with aura    Multinodular goiter    Nephrolithiasis    Opioid dependence (HCC)    a.) on buprenorphine   OSA on CPAP    Plantar fascial fibromatosis    Pneumonia due to COVID-19 virus    Post traumatic stress disorder (PTSD)    Pre-diabetes    PVD (peripheral vascular disease)    Seasonal allergies    Shortness of breath on exertion    Stress incontinence    Swelling of limb 04/26/2016   Urge incontinence    Venous insufficiency    Venous ulcer (HCC)    Vitamin D  deficiency    Past Surgical History:  Procedure Laterality Date   ABDOMINAL HYSTERECTOMY     ANKLE SURGERY     CHOLECYSTECTOMY     COLONOSCOPY WITH PROPOFOL  N/A 06/24/2019   Procedure: COLONOSCOPY WITH PROPOFOL ;  Surgeon: Toledo, Ladell POUR, MD;  Location: [facility];  Service: Gastroenterology;  Laterality: N/A;   CYSTOSCOPY N/A 04/09/2016    Procedure: CYSTOSCOPY;  Surgeon: Glendia Elizabeth, MD;  Location: [facility] ORS;  Service: Urology;  Laterality: N/A;   DILATION AND CURETTAGE OF UTERUS     ESOPHAGOGASTRODUODENOSCOPY (EGD) WITH PROPOFOL  N/A 06/24/2019   Procedure: ESOPHAGOGASTRODUODENOSCOPY (EGD) WITH PROPOFOL ;  Surgeon: Toledo, Ladell POUR, MD;  Location: [facility];  Service: Gastroenterology;  Laterality: N/A;   ESOPHAGOGASTRODUODENOSCOPY (EGD) WITH PROPOFOL  N/A 10/01/2023   Procedure: ESOPHAGOGASTRODUODENOSCOPY (EGD) WITH PROPOFOL ;  Surgeon: Therisa Bi, MD;  Location: [facility];  Service: Gastroenterology;  Laterality: N/A;   INGUINAL HERNIA REPAIR Bilateral 04/18/2017   Procedure: LAPAROSCOPIC BILATERAL INGUINAL HERNIA REPAIR;  Surgeon: Jordis Laneta FALCON, MD;  Location: [facility] ORS;  Service: General;  Laterality: Bilateral;   INTERSTIM IMPLANT PLACEMENT N/A 11/26/2016   Procedure: RENNA IMPLANT FIRST STAGE;  Surgeon: Elizabeth Glendia, MD;  Location: [facility] ORS;  Service: Urology;  Laterality: N/A;   INTERSTIM IMPLANT PLACEMENT N/A 11/26/2016   Procedure: RENNA IMPLANT SECOND STAGE;  Surgeon: Elizabeth Glendia, MD;  Location: [facility] ORS;  Service: Urology;  Laterality: N/A;   INTERSTIM IMPLANT PLACEMENT N/A 02/18/2023   Procedure:  INTERSTIM IMPLANT FIRST STAGE WITH IMPEDANCE CHECK;  Surgeon: Gaston Hamilton, MD;  Location: [facility] ORS;  Service: Urology;  Laterality: N/A;   INTERSTIM IMPLANT PLACEMENT N/A 02/18/2023   Procedure: RENNA IMPLANT SECOND STAGE;  Surgeon: Gaston Hamilton, MD;  Location: [facility];  Service: Urology;  Laterality: N/A;   INTERSTIM IMPLANT REMOVAL N/A 02/18/2023   Procedure: REMOVAL OF INTERSTIM IMPLANT;  Surgeon: Gaston Hamilton, MD;  Location: [facility] ORS;  Service: Urology;  Laterality: N/A;   IVC FILTER INSERTION N/A 05/19/2024   Procedure: IVC FILTER INSERTION;  Surgeon: Jama Cordella MATSU, MD;  Location: [facility];  Service: Cardiovascular;  Laterality: N/A;   KNEE ARTHROSCOPY  Bilateral    KNEE ARTHROSCOPY WITH LATERAL RELEASE Left 10/18/2015   Procedure: KNEE ARTHROSCOPY LATERAL AND PARTIAL SYNOVECTOMY;  Surgeon: Ozell Flake, MD;  Location: [facility] ORS;  Service: Orthopedics;  Laterality: Left;   Lymph Node removal  2015   Neck   PUBOVAGINAL SLING N/A 04/09/2016   Procedure: PUBO-VAGINAL SLING/ RETROPUBIC SLING;  Surgeon: Hamilton Gaston, MD;  Location: [facility] ORS;  Service: Urology;  Laterality: N/A;   REPLACEMENT TOTAL KNEE Right    SHOULDER SURGERY Right 2014   TUBAL LIGATION     WRIST SURGERY Right    metal plate   Family History  Adopted: Yes  Family history unknown: Yes   Social History   Tobacco Use   Smoking status: Former    Current packs/day: 0.00    Average packs/day: 0.5 packs/day for 11.0 years (5.5 ttl pk-yrs)    Types: Cigarettes    Start date: 07/16/2008    Quit date: 07/16/2019    Years since quitting: 4.8    Passive exposure: Past   Smokeless tobacco: Never   Tobacco comments:    STRESS RELATED  Substance Use Topics   Alcohol use: Not Currently    Comment: Hisotry of ETOH abouse ASB patient   LABS:  Lab Results  Component Value Date   WBC 9.0 05/18/2024   HGB 11.1 (L) 05/18/2024   HCT 34.0 (L) 05/18/2024   MCV 85.9 05/18/2024   PLT 283 05/18/2024   Lab Results  Component Value Date   NA 136 05/18/2024   CL 100 05/18/2024   K 3.4 (L) 05/18/2024   CO2 26 05/18/2024   BUN 15 05/18/2024   CREATININE 0.69 05/18/2024   GFRNONAA >60 05/18/2024   CALCIUM 8.4 (L) 05/18/2024   ALBUMIN 3.5 05/18/2024   GLUCOSE 117 (H) 05/18/2024    ECG: Date: 05/18/2024 Time ECG obtained: 1336 PM Rate: 66 bpm Rhythm: normal sinus Axis (leads I and aVF): normal Intervals: PR 186 ms. QRS 96 ms. QTc 436 ms. ST segment and T wave changes: No evidence of acute T wave abnormalities or significant ST segment elevation or depression.  Evidence of a possible, age undetermined, prior infarct:  No Comparison: Similar to previous tracing obtained  on 01/01/2023   IMAGING / PROCEDURES: X-RAY KNEE LEFT 4 PLUS VIEWS performed on 03/09/2024 There is significant medial compartment degenerative change with joint space narrowing.   Total knee arthroplasty is present on the contralateral side without overt signs of hardware failure.   CT ABDOMEN PELVIS W CONTRAST performed on 01/07/2023 Fairly small hiatal hernia at the time of imaging with fluid in the herniated stomach and visualized distal esophagus. Diverticulosis of the lower descending and sigmoid colon without evidence of diverticulitis.  TRANSTHORACIC ECHOCARDIOGRAM performed on 02/22/2022 Technically adequate study.  Normal chamber sizes.  Normal left ventricular systolic function.  Mild left ventricular hypertrophy with GRADE 1 (relaxation abnormality) diastolic dysfunction.  Normal right ventricular systolic function.  Normal right ventricular diastolic function.  Normal left ventricular wall motion.  Normal right ventricular wall motion.  Trace pulmonary regurgitation.  Trace tricuspid regurgitation.  Normal pulmonary artery pressure.  Trace mitral regurgitation.  No pericardial effusion.  Mildly dilated Left atrium  No LVH.  CT CORONARY MORPH W/CTA COR W/SCORE W/CA W/CM &/OR WO/CM  performed on 06/12/2022 Quality of study: Excellent  Calcium score: 0  Left dominant system.  Normal Coronaries.  IMPRESSION AND PLAN: Alexis Christensen has been referred for pre-anesthesia review and clearance prior to her undergoing the planned anesthetic and procedural courses. Available labs, pertinent testing, and imaging results were personally reviewed by me in preparation for upcoming operative/procedural course. [facility] medical record has been updated following extensive record review and patient interview with PAT staff.   This patient has been appropriately cleared by cardiology with an overall LOW risk of patient experiencing significant perioperative cardiovascular  complications. Based on clinical review performed today (05/26/24), barring any significant acute changes in the patient's overall condition, it is anticipated that she will be able to proceed with the planned surgical intervention. Any acute changes in clinical condition may necessitate her procedure being postponed and/or cancelled. Patient will meet with anesthesia team (MD and/or CRNA) on the day of her procedure for preoperative evaluation/assessment. Questions regarding anesthetic course will be fielded at that time.   Pre-surgical instructions were reviewed with the patient during his PAT appointment, and questions were fielded to satisfaction by PAT clinical staff. She has been instructed on which medications that she will need to hold prior to surgery, as well as the ones that have been deemed safe/appropriate to take on the day of her procedure. As part of the general education provided by PAT, patient made aware both verbally and in writing, that she would need to abstain from the use of any illegal substances during her perioperative course. She was advised that failure to follow the provided instructions could necessitate case cancellation or result in serious perioperative complications up to and including death. Patient encouraged to contact PAT and/or her surgeon's office to discuss any questions or concerns that may arise prior to surgery; verbalized understanding.   Dorise Pereyra, MSN, APRN, FNP-C, CEN [facility]  Perioperative Services Nurse Practitioner Phone: [phone] Fax: [phone] 05/26/24 3:05 PM  NOTE: This note has been prepared using Dragon dictation software. Despite my best ability to proofread, there is always the potential that unintentional transcription/al errors may still occur from this process.

## 2024-05-26 ENCOUNTER — Telehealth: Payer: Self-pay

## 2024-05-26 ENCOUNTER — Encounter: Payer: Self-pay | Admitting: Cardiovascular Disease

## 2024-05-26 ENCOUNTER — Ambulatory Visit: Admitting: Cardiovascular Disease

## 2024-05-26 VITALS — BP 114/64 | HR 59 | Ht 64.0 in | Wt 245.0 lb

## 2024-05-26 DIAGNOSIS — Z6841 Body Mass Index (BMI) 40.0 and over, adult: Secondary | ICD-10-CM | POA: Insufficient documentation

## 2024-05-26 DIAGNOSIS — I340 Nonrheumatic mitral (valve) insufficiency: Secondary | ICD-10-CM

## 2024-05-26 DIAGNOSIS — E782 Mixed hyperlipidemia: Secondary | ICD-10-CM | POA: Diagnosis not present

## 2024-05-26 DIAGNOSIS — Z01818 Encounter for other preprocedural examination: Secondary | ICD-10-CM

## 2024-05-26 DIAGNOSIS — I5030 Unspecified diastolic (congestive) heart failure: Secondary | ICD-10-CM

## 2024-05-26 DIAGNOSIS — I5032 Chronic diastolic (congestive) heart failure: Secondary | ICD-10-CM

## 2024-05-26 DIAGNOSIS — R0602 Shortness of breath: Secondary | ICD-10-CM | POA: Diagnosis not present

## 2024-05-26 MED ORDER — SPIRONOLACTONE 25 MG PO TABS: 25.0000 mg | ORAL_TABLET | Freq: Once | ORAL | 1 refills | Status: AC

## 2024-05-26 MED ORDER — LISINOPRIL 10 MG PO TABS
10.0000 mg | ORAL_TABLET | Freq: Every day | ORAL | 11 refills | Status: AC
Start: 2024-05-26 — End: 2025-05-26

## 2024-05-26 NOTE — Telephone Encounter (Signed)
 Patient needs an appt per SK for surgery clearance

## 2024-05-26 NOTE — Anesthesia Preprocedure Evaluation (Signed)
 Anesthesia Evaluation  Patient identified by MRN, date of birth, ID band Patient awake    Reviewed: Allergy & Precautions, NPO status , Patient's Chart, lab work & pertinent test results  History of Anesthesia Complications Negative for: history of anesthetic complications  Airway Mallampati: III  TM Distance: <3 FB Neck ROM: full    Dental  (+) Chipped, Poor Dentition, Missing   Pulmonary neg shortness of breath, asthma , sleep apnea , former smoker   Pulmonary exam normal        Cardiovascular Exercise Tolerance: Good hypertension, (-) angina + Peripheral Vascular Disease and +CHF (Chronic diastolic heart failure)  (-) Past MI Normal cardiovascular exam+ Valvular Problems/Murmurs MR   Most recent TTE performed on 02/23/2024 revealed a normal left ventricular systolic function with an EF of >55%. There was no LVH.  There were no regional wall motion abnormalities. Left ventricular diastolic Doppler parameters consistent with abnormal relaxation (G1DD).  Left atrium is mildly dilated.  Right ventricular size and function normal. There was trivial pulmonary, tricuspid, and mitral valve regurgitation.  All transvalvular gradients were noted to be normal providing no evidence of hemodynamically significant valvular stenosis. Aorta normal in size with no evidence of ectasia or aneurysmal dilatation   Neuro/Psych  Headaches PSYCHIATRIC DISORDERS Anxiety  Bipolar Disorder   opioid dependence (on buprenorphine)  Neuromuscular disease    GI/Hepatic Neg liver ROS, hiatal hernia,GERD  Controlled,,  Endo/Other  Hypothyroidism    Renal/GU Renal disease  negative genitourinary   Musculoskeletal  (+) Arthritis ,    Abdominal   Peds  Hematology  (+) Blood dyscrasia, anemia   Anesthesia Other Findings Pt now s/p placement of an IVC filter given her previous history of right lower extremity DVT years ago prior to surgery for her  knee.  Past Medical History: 2016: Acute deep vein thrombosis (DVT) of right lower extremity (HCC) No date: ADHD No date: Adopted No date: ANA positive No date: Anemia No date: Anxiety No date: Arthritis No date: Asthma No date: B12 deficiency No date: Bilateral hand pain No date: Bilateral swelling of feet and ankles No date: Bipolar 1 disorder (HCC) No date: Carpal tunnel syndrome of left wrist No date: Chest pain 09/07/2015: Chronic diarrhea No date: Chronic diastolic (congestive) heart failure (HCC) No date: Chronic left shoulder pain 04/26/2016: Chronic venous insufficiency 04/06/2019: Clostridioides difficile infection No date: Depression No date: Diverticulosis No date: Edema leg No date: GERD (gastroesophageal reflux disease) No date: Heart murmur 08/24/2015: Helicobacter pylori (H. pylori) infection No date: Hiatal hernia 09/09/2020: History of 2019 novel coronavirus disease (COVID-19)     Comment:  a.) PCR testing (+): 09/09/2020, 01/12/2021, 07/17/2021 No date: HLD (hyperlipidemia) No date: Hx MRSA infection No date: Hypothyroidism 10/01/2022: Influenza A No date: Knee effusion No date: Knee pain 05/11/2013: Lipoma of arm No date: Lower extremity edema No date: Lumbago 04/26/2016: Lymphedema No date: Migraine with aura No date: Multinodular goiter No date: Nephrolithiasis No date: Plantar fascial fibromatosis No date: Post traumatic stress disorder (PTSD) No date: Pre-diabetes No date: PVD (peripheral vascular disease) (HCC) No date: Seasonal allergies No date: Shortness of breath on exertion No date: Stress incontinence No date: Urge incontinence No date: Venous insufficiency No date: Venous ulcer (HCC) No date: Vitamin D  deficiency  Past Surgical History: No date: ABDOMINAL HYSTERECTOMY No date: ANKLE SURGERY No date: CHOLECYSTECTOMY 06/24/2019: COLONOSCOPY WITH PROPOFOL ; N/A     Comment:  Procedure: COLONOSCOPY WITH PROPOFOL ;  Surgeon: [place],  Teodoro K, MD;  Location: [facility];  Service:               Gastroenterology;  Laterality: N/A; 04/09/2016: CYSTOSCOPY; N/A     Comment:  Procedure: CYSTOSCOPY;  Surgeon: Glendia Elizabeth, MD;                Location: [facility];  Service: Urology;  Laterality: N/A; No date: DILATION AND CURETTAGE OF UTERUS 06/24/2019: ESOPHAGOGASTRODUODENOSCOPY (EGD) WITH PROPOFOL ; N/A     Comment:  Procedure: ESOPHAGOGASTRODUODENOSCOPY (EGD) WITH               PROPOFOL ;  Surgeon: Tol[place], Ladell POUR, MD;  Location:               [facility];  Service: Gastroenterology;  Laterality:               N/A; 04/18/2017: INGUINAL HERNIA REPAIR; Bilateral     Comment:  Procedure: LAPAROSCOPIC BILATERAL INGUINAL HERNIA               REPAIR;  Surgeon: Jordis Laneta FALCON, MD;  Location: [facility]               ORS;  Service: General;  Laterality: Bilateral; 11/26/2016: RENNA IMPLANT PLACEMENT; N/A     Comment:  Procedure: RENNA IMPLANT FIRST STAGE;  Surgeon:               Elizabeth Glendia, MD;  Location: [facility];  Service:               Urology;  Laterality: N/A; 11/26/2016: RENNA IMPLANT PLACEMENT; N/A     Comment:  Procedure: INTERSTIM IMPLANT SECOND STAGE;  Surgeon:               Elizabeth Glendia, MD;  Location: [facility];  Service:               Urology;  Laterality: N/A; 02/18/2023: INTERSTIM IMPLANT PLACEMENT; N/A     Comment:  Procedure: INTERSTIM IMPLANT FIRST STAGE WITH IMPEDANCE               CHECK;  Surgeon: Elizabeth Glendia, MD;  Location: [facility]               ORS;  Service: Urology;  Laterality: N/A; 02/18/2023: INTERSTIM IMPLANT PLACEMENT; N/A     Comment:  Procedure: INTERSTIM IMPLANT SECOND STAGE;  Surgeon:               Elizabeth Glendia, MD;  Location: [facility];  Service:               Urology;  Laterality: N/A; 02/18/2023: INTERSTIM IMPLANT REMOVAL; N/A     Comment:  Procedure: REMOVAL OF INTERSTIM IMPLANT;  Surgeon:               Elizabeth Glendia, MD;  Location: [facility];   Service:               Urology;  Laterality: N/A; No date: JOINT REPLACEMENT; Right     Comment:  knee No date: KNEE ARTHROSCOPY; Bilateral 10/18/2015: KNEE ARTHROSCOPY WITH LATERAL RELEASE; Left     Comment:  Procedure: KNEE ARTHROSCOPY LATERAL AND PARTIAL               SYNOVECTOMY;  Surgeon: Ozell Flake, MD;  Location: [facility]               ORS;  Service: Orthopedics;  Laterality: Left; 2015: Lymph Node removal     Comment:  Neck  04/09/2016: PUBOVAGINAL SLING; N/A     Comment:  Procedure: PUBO-VAGINAL SLING/ RETROPUBIC SLING;                Surgeon: Glendia Elizabeth, MD;  Location: [facility];                Service: Urology;  Laterality: N/A; No date: REPLACEMENT TOTAL KNEE; Right 2014: SHOULDER SURGERY; Right No date: TUBAL LIGATION No date: WRIST SURGERY; Right     Comment:  metal plate  BMI    Body Mass Index: 42.95 kg/m      Reproductive/Obstetrics negative OB ROS                              Anesthesia Physical Anesthesia Plan  ASA: 3  Anesthesia Plan: General   Post-op Pain Management:    Induction: Intravenous  PONV Risk Score and Plan: Propofol  infusion and TIVA  Airway Management Planned: Natural Airway and Nasal Cannula  Additional Equipment:   Intra-op Plan:   Post-operative Plan:   Informed Consent:      Dental Advisory Given  Plan Discussed with: Anesthesiologist, CRNA and Surgeon  Anesthesia Plan Comments:         Anesthesia Quick Evaluation

## 2024-05-26 NOTE — Progress Notes (Signed)
 Cardiology Office Note   Date:  05/26/2024   ID:  Alexis Christensen, DOB 03-01-1971, MRN [id number]  PCP:  Orlean Alan CHRISTELLA, FNP  Cardiologist:  Denyse Bathe, MD      History of Present Illness: Alexis Christensen is a 53 y.o. female who presents for  Chief Complaint  Patient presents with   Medical Clearance    Medical Clearance     Pain in knee, no chest pains or SOB.      Past Medical History:  Diagnosis Date   (HFpEF) heart failure with preserved ejection fraction (HCC)    Acute deep vein thrombosis (DVT) of right lower extremity (HCC) 2016   ADHD    Adopted    ANA positive    Anemia    Anxiety    Arthritis    Asthma    B12 deficiency    Bilateral swelling of feet and ankles    Bipolar 1 disorder (HCC)    Carpal tunnel syndrome of left wrist    Chest pain    Chronic diarrhea 09/07/2015   Chronic left shoulder pain    Clostridioides difficile infection 04/06/2019   Depression    Diverticulosis    Edema leg    GERD (gastroesophageal reflux disease)    Heart murmur    Helicobacter pylori (H. pylori) infection 08/24/2015   Hiatal hernia    History of 2019 novel coronavirus disease (COVID-19) 09/09/2020   a.) PCR testing (+): 09/09/2020, 01/12/2021, 07/17/2021   HLD (hyperlipidemia)    HTN (hypertension)    Hx MRSA infection    Hypothyroidism    Influenza A 10/01/2022   Lipoma of arm 05/11/2013   Lower extremity edema    Lumbago    Lymphedema 04/26/2016   Medial meniscus tear (LEFT)    Migraine with aura    Multinodular goiter    Nephrolithiasis    Opioid dependence (HCC)    a.) on buprenorphine   OSA on CPAP    Plantar fascial fibromatosis    Pneumonia due to COVID-19 virus    Post traumatic stress disorder (PTSD)    Pre-diabetes    PVD (peripheral vascular disease)    Seasonal allergies    Shortness of breath on exertion    Stress incontinence    Swelling of limb 04/26/2016   Urge incontinence    Venous insufficiency    Venous ulcer (HCC)     Vitamin D  deficiency      Past Surgical History:  Procedure Laterality Date   ABDOMINAL HYSTERECTOMY     ANKLE SURGERY     CHOLECYSTECTOMY     COLONOSCOPY WITH PROPOFOL  N/A 06/24/2019   Procedure: COLONOSCOPY WITH PROPOFOL ;  Surgeon: [place], Ladell POUR, MD;  Location: [facility];  Service: Gastroenterology;  Laterality: N/A;   CYSTOSCOPY N/A 04/09/2016   Procedure: CYSTOSCOPY;  Surgeon: Glendia Elizabeth, MD;  Location: [facility];  Service: Urology;  Laterality: N/A;   DILATION AND CURETTAGE OF UTERUS     ESOPHAGOGASTRODUODENOSCOPY (EGD) WITH PROPOFOL  N/A 06/24/2019   Procedure: ESOPHAGOGASTRODUODENOSCOPY (EGD) WITH PROPOFOL ;  Surgeon: Toledo, Ladell POUR, MD;  Location: [facility];  Service: Gastroenterology;  Laterality: N/A;   ESOPHAGOGASTRODUODENOSCOPY (EGD) WITH PROPOFOL  N/A 10/01/2023   Procedure: ESOPHAGOGASTRODUODENOSCOPY (EGD) WITH PROPOFOL ;  Surgeon: Therisa Bi, MD;  Location: [facility];  Service: Gastroenterology;  Laterality: N/A;   INGUINAL HERNIA REPAIR Bilateral 04/18/2017   Procedure: LAPAROSCOPIC BILATERAL INGUINAL HERNIA REPAIR;  Surgeon: Jordis Laneta FALCON, MD;  Location: [facility];  Service: General;  Laterality: Bilateral;   INTERSTIM IMPLANT PLACEMENT N/A 11/26/2016   Procedure: RENNA IMPLANT FIRST STAGE;  Surgeon: Gaston Hamilton, MD;  Location: [facility];  Service: Urology;  Laterality: N/A;   INTERSTIM IMPLANT PLACEMENT N/A 11/26/2016   Procedure: RENNA IMPLANT SECOND STAGE;  Surgeon: Gaston Hamilton, MD;  Location: [facility];  Service: Urology;  Laterality: N/A;   INTERSTIM IMPLANT PLACEMENT N/A 02/18/2023   Procedure: RENNA IMPLANT FIRST STAGE WITH IMPEDANCE CHECK;  Surgeon: Gaston Hamilton, MD;  Location: [facility];  Service: Urology;  Laterality: N/A;   INTERSTIM IMPLANT PLACEMENT N/A 02/18/2023   Procedure: RENNA IMPLANT SECOND STAGE;  Surgeon: Gaston Hamilton, MD;  Location: [facility];  Service: Urology;  Laterality: N/A;    INTERSTIM IMPLANT REMOVAL N/A 02/18/2023   Procedure: REMOVAL OF INTERSTIM IMPLANT;  Surgeon: Gaston Hamilton, MD;  Location: [facility];  Service: Urology;  Laterality: N/A;   IVC FILTER INSERTION N/A 05/19/2024   Procedure: IVC FILTER INSERTION;  Surgeon: Jama Cordella MATSU, MD;  Location: [facility];  Service: Cardiovascular;  Laterality: N/A;   KNEE ARTHROSCOPY Bilateral    KNEE ARTHROSCOPY WITH LATERAL RELEASE Left 10/18/2015   Procedure: KNEE ARTHROSCOPY LATERAL AND PARTIAL SYNOVECTOMY;  Surgeon: Ozell Flake, MD;  Location: [facility];  Service: Orthopedics;  Laterality: Left;   Lymph Node removal  2015   Neck   PUBOVAGINAL SLING N/A 04/09/2016   Procedure: PUBO-VAGINAL SLING/ RETROPUBIC SLING;  Surgeon: Hamilton Gaston, MD;  Location: [facility];  Service: Urology;  Laterality: N/A;   REPLACEMENT TOTAL KNEE Right    SHOULDER SURGERY Right 2014   TUBAL LIGATION     WRIST SURGERY Right    metal plate     Current Outpatient Medications  Medication Sig Dispense Refill   albuterol  (VENTOLIN  HFA) 108 (90 Base) MCG/ACT inhaler Inhale 1 puff into the lungs every 6 (six) hours as needed for wheezing or shortness of breath.     ARIPiprazole (ABILIFY) 30 MG tablet Take 30 mg by mouth daily.     aspirin 81 MG chewable tablet Aspirin 81 MG Oral Tablet Chewable QTY: 30 tablet Days: 30 Refills: 0  Written: 01/23/21 Patient Instructions: once a day     Budeson-Glycopyrrol-Formoterol (BREZTRI AEROSPHERE) 160-9-4.8 MCG/ACT AERO Inhale 2 puffs into the lungs in the morning and at bedtime.     celecoxib (CELEBREX) 100 MG capsule Take 100 mg by mouth 2 (two) times daily.     colestipol  (COLESTID ) 1 g tablet TAKE 2 TABLETS(2 GRAMS) BY MOUTH TWICE DAILY 120 tablet 5   dicyclomine (BENTYL) 10 MG capsule TAKE 1 CAPSULE BY MOUTH EVERY 8 HOURS AS NEEDED FOR ABDOMINAL PAIN 270 capsule 3   DULoxetine (CYMBALTA) 60 MG capsule Take 60 mg by mouth 2 (two) times daily.     esomeprazole  (NEXIUM ) 40 MG  capsule Take 1 capsule (40 mg total) by mouth 2 (two) times daily before a meal. 180 capsule 1   gabapentin  (NEURONTIN ) 600 MG tablet Take 600 mg by mouth 2 (two) times daily.      hydrOXYzine  (ATARAX /VISTARIL ) 25 MG tablet Take 50 mg by mouth every 8 (eight) hours as needed for itching.     levothyroxine  (SYNTHROID ) 200 MCG tablet Take 200 mcg by mouth daily before breakfast.     liothyronine (CYTOMEL) 5 MCG tablet Take 10 mcg by mouth daily.     lisinopril  (ZESTRIL ) 10 MG tablet Take 1 tablet (10 mg total) by mouth daily. 30 tablet 11   metoprolol tartrate (LOPRESSOR) 50 MG tablet  Take 50 mg by mouth daily in the afternoon.     nitrofurantoin , macrocrystal-monohydrate, (MACROBID ) 100 MG capsule Take 1 capsule (100 mg total) by mouth 2 (two) times daily for 5 days. Increase WATER intake while taking this medication. 10 capsule 0   nortriptyline (PAMELOR) 10 MG capsule Take 10 mg in the morning     nystatin  powder Apply 1 Application topically 3 (three) times daily. 15 g 3   rosuvastatin (CRESTOR) 40 MG tablet Take 40 mg by mouth daily.     spironolactone (ALDACTONE) 25 MG tablet Take 1 tablet (25 mg total) by mouth once for 1 dose. 45 tablet 1   triamcinolone  cream (KENALOG ) 0.1 % Apply 1 Application topically 2 (two) times daily. 453 g 3   trimethoprim -polymyxin b  (POLYTRIM ) ophthalmic solution Place 2 drops into the left eye every 4 (four) hours while awake x 5 days 10 mL 0   No current facility-administered medications for this visit.    Allergies:   Geodon [ziprasidone hydrochloride], Sulfacetamide sodium, Ziprasidone, Ace inhibitors, Erythromycin, Hydromorphone, Lamictal [lamotrigine], Strawberry (diagnostic), and Sulfa antibiotics    Social History:   reports that she quit smoking about 4 years ago. Her smoking use included cigarettes. She started smoking about 15 years ago. She has a 5.5 pack-year smoking history. She has been exposed to tobacco smoke. She has never used smokeless  tobacco. She reports that she does not currently use alcohol. She reports that she does not currently use drugs.   Family History:  She was adopted. Family history is unknown by patient.    ROS:     Review of Systems  Constitutional: Negative.   HENT: Negative.    Eyes: Negative.   Respiratory: Negative.    Gastrointestinal: Negative.   Genitourinary: Negative.   Musculoskeletal: Negative.   Skin: Negative.   Neurological: Negative.   Endo/Heme/Allergies: Negative.   Psychiatric/Behavioral: Negative.    All other systems reviewed and are negative.     All other systems are reviewed and negative.    PHYSICAL EXAM: VS:  BP 114/64   Pulse (!) 59   Ht 5' 4 (1.626 m)   Wt 245 lb (111.1 kg)   SpO2 98%   BMI 42.05 kg/m  , BMI Body mass index is 42.05 kg/m. Last weight:  Wt Readings from Last 3 Encounters:  05/26/24 245 lb (111.1 kg)  05/19/24 246 lb 12.8 oz (111.9 kg)  05/18/24 245 lb (111.1 kg)     Physical Exam Constitutional:      Appearance: Normal appearance.  Cardiovascular:     Rate and Rhythm: Normal rate and regular rhythm.     Heart sounds: Normal heart sounds.  Pulmonary:     Effort: Pulmonary effort is normal.     Breath sounds: Normal breath sounds.  Musculoskeletal:     Right lower leg: No edema.     Left lower leg: No edema.  Neurological:     Mental Status: She is alert.       EKG:   Recent Labs: 09/27/2023: TSH 10.300 05/18/2024: ALT 13; BUN 15; Creatinine, Ser 0.69; Hemoglobin 11.1; Platelets 283; Potassium 3.4; Sodium 136    Lipid Panel    Component Value Date/Time   CHOL 188 09/27/2023 0939   TRIG 88 09/27/2023 0939   HDL 56 09/27/2023 0939   CHOLHDL 3.4 09/27/2023 0939   LDLCALC 116 (H) 09/27/2023 0939      Other studies Reviewed: Additional studies/ records that were reviewed today include:  Review of  the above records demonstrates:       No data to display            ASSESSMENT AND PLAN:    ICD-10-CM   1.  Preoperative clearance  Z01.818    Low risk, proceed with surgery    2. SOB (shortness of breath)  R06.02 lisinopril  (ZESTRIL ) 10 MG tablet    Basic metabolic panel with GFR    PCV ECHOCARDIOGRAM COMPLETE    3. Nonrheumatic mitral valve regurgitation  I34.0 lisinopril  (ZESTRIL ) 10 MG tablet    Basic metabolic panel with GFR    PCV ECHOCARDIOGRAM COMPLETE    4. Mixed hyperlipidemia  E78.2 lisinopril  (ZESTRIL ) 10 MG tablet    Basic metabolic panel with GFR    PCV ECHOCARDIOGRAM COMPLETE    5. Diastolic congestive heart failure, unspecified HF chronicity (HCC)  I50.30 lisinopril  (ZESTRIL ) 10 MG tablet    Basic metabolic panel with GFR    PCV ECHOCARDIOGRAM COMPLETE    6. Diastolic congestive heart failure, unspecified HF chronicity (HCC)  I50.30 lisinopril  (ZESTRIL ) 10 MG tablet    Basic metabolic panel with GFR    PCV ECHOCARDIOGRAM COMPLETE   BP repeat 120/85, will go up on lisinopril  10, add jardiance  25    7. Chronic diastolic heart failure (HCC)  I50.32 spironolactone (ALDACTONE) 25 MG tablet    Basic metabolic panel with GFR    PCV ECHOCARDIOGRAM COMPLETE    8. Body mass index (BMI) 40.0-44.9, adult (HCC)  Z68.41        Problem List Items Addressed This Visit       Cardiovascular and Mediastinum   Diastolic congestive heart failure (HCC)   Relevant Medications   lisinopril  (ZESTRIL ) 10 MG tablet   spironolactone (ALDACTONE) 25 MG tablet   Other Relevant Orders   Basic metabolic panel with GFR   PCV ECHOCARDIOGRAM COMPLETE     Other   Hyperlipidemia   Relevant Medications   lisinopril  (ZESTRIL ) 10 MG tablet   spironolactone (ALDACTONE) 25 MG tablet   Other Relevant Orders   Basic metabolic panel with GFR   PCV ECHOCARDIOGRAM COMPLETE   Body mass index (BMI) 40.0-44.9, adult (HCC)   Other Visit Diagnoses       Preoperative clearance    -  Primary   Low risk, proceed with surgery     SOB (shortness of breath)       Relevant Medications   lisinopril   (ZESTRIL ) 10 MG tablet   Other Relevant Orders   Basic metabolic panel with GFR   PCV ECHOCARDIOGRAM COMPLETE     Nonrheumatic mitral valve regurgitation       Relevant Medications   lisinopril  (ZESTRIL ) 10 MG tablet   spironolactone (ALDACTONE) 25 MG tablet   Other Relevant Orders   Basic metabolic panel with GFR   PCV ECHOCARDIOGRAM COMPLETE     Chronic diastolic heart failure (HCC)       Relevant Medications   lisinopril  (ZESTRIL ) 10 MG tablet   spironolactone (ALDACTONE) 25 MG tablet   Other Relevant Orders   Basic metabolic panel with GFR   PCV ECHOCARDIOGRAM COMPLETE          Disposition:   Return in about 6 weeks (around 07/07/2024) for echo and f/u.    Total time spent: 30 minutes  Signed,  Denyse Bathe, MD  05/26/2024 2:51 PM    [facility]

## 2024-05-28 ENCOUNTER — Other Ambulatory Visit: Payer: Self-pay

## 2024-05-28 ENCOUNTER — Encounter: Payer: Self-pay | Admitting: Orthopedic Surgery

## 2024-05-28 ENCOUNTER — Ambulatory Visit

## 2024-05-28 ENCOUNTER — Ambulatory Visit: Admitting: Urgent Care

## 2024-05-28 ENCOUNTER — Ambulatory Visit
Admission: RE | Admit: 2024-05-28 | Discharge: 2024-05-29 | Disposition: A | Discharge status: Home Health | Attending: Orthopedic Surgery | Admitting: Orthopedic Surgery

## 2024-05-28 ENCOUNTER — Ambulatory Visit: Payer: Self-pay | Admitting: Urgent Care

## 2024-05-28 ENCOUNTER — Encounter: Payer: Self-pay | Admitting: Oncology

## 2024-05-28 ENCOUNTER — Encounter
Admission: RE | Disposition: A | Discharge status: Home Health | Payer: Self-pay | Source: Home / Self Care | Attending: Orthopedic Surgery

## 2024-05-28 DIAGNOSIS — M1712 Unilateral primary osteoarthritis, left knee: Secondary | ICD-10-CM | POA: Diagnosis present

## 2024-05-28 DIAGNOSIS — S83232A Complex tear of medial meniscus, current injury, left knee, initial encounter: Secondary | ICD-10-CM | POA: Diagnosis present

## 2024-05-28 DIAGNOSIS — J45909 Unspecified asthma, uncomplicated: Secondary | ICD-10-CM | POA: Diagnosis not present

## 2024-05-28 DIAGNOSIS — I5032 Chronic diastolic (congestive) heart failure: Secondary | ICD-10-CM | POA: Diagnosis not present

## 2024-05-28 DIAGNOSIS — K5730 Diverticulosis of large intestine without perforation or abscess without bleeding: Secondary | ICD-10-CM | POA: Diagnosis not present

## 2024-05-28 DIAGNOSIS — M170 Bilateral primary osteoarthritis of knee: Secondary | ICD-10-CM | POA: Insufficient documentation

## 2024-05-28 DIAGNOSIS — Z86718 Personal history of other venous thrombosis and embolism: Secondary | ICD-10-CM | POA: Diagnosis not present

## 2024-05-28 DIAGNOSIS — E039 Hypothyroidism, unspecified: Secondary | ICD-10-CM | POA: Diagnosis not present

## 2024-05-28 DIAGNOSIS — Z87891 Personal history of nicotine dependence: Secondary | ICD-10-CM | POA: Insufficient documentation

## 2024-05-28 DIAGNOSIS — G4733 Obstructive sleep apnea (adult) (pediatric): Secondary | ICD-10-CM

## 2024-05-28 DIAGNOSIS — K219 Gastro-esophageal reflux disease without esophagitis: Secondary | ICD-10-CM | POA: Diagnosis not present

## 2024-05-28 DIAGNOSIS — Z96652 Presence of left artificial knee joint: Secondary | ICD-10-CM | POA: Diagnosis present

## 2024-05-28 DIAGNOSIS — I110 Hypertensive heart disease with heart failure: Secondary | ICD-10-CM | POA: Insufficient documentation

## 2024-05-28 HISTORY — DX: Other tear of medial meniscus, current injury, unspecified knee, initial encounter: S83.249A

## 2024-05-28 HISTORY — DX: Obstructive sleep apnea (adult) (pediatric): G47.33

## 2024-05-28 HISTORY — DX: Essential (primary) hypertension: I10

## 2024-05-28 HISTORY — PX: TOTAL KNEE ARTHROPLASTY: SHX125

## 2024-05-28 HISTORY — DX: Opioid dependence, uncomplicated: F11.20

## 2024-05-28 SURGERY — ARTHROPLASTY, KNEE, TOTAL
Anesthesia: General | Site: Knee | Laterality: Left

## 2024-05-28 MED ORDER — LIDOCAINE HCL (CARDIAC) PF 100 MG/5ML IV SOSY
PREFILLED_SYRINGE | INTRAVENOUS | Status: DC | PRN
  Administered 2024-05-28: 100 mg via INTRAVENOUS

## 2024-05-28 MED ORDER — OXYCODONE HCL 5 MG PO TABS
5.0000 mg | ORAL_TABLET | Freq: Four times a day (QID) | ORAL | 0 refills | Status: AC | PRN
  Filled 2024-05-28: qty 20, 5d supply, fill #0

## 2024-05-28 MED ORDER — LIDOCAINE HCL (PF) 2 % IJ SOLN
INTRAMUSCULAR | Status: AC
  Filled 2024-05-28: qty 5

## 2024-05-28 MED ORDER — FENTANYL CITRATE (PF) 100 MCG/2ML IJ SOLN
25.0000 ug | INTRAMUSCULAR | Status: DC | PRN
  Administered 2024-05-28: 50 ug via INTRAVENOUS
  Administered 2024-05-28 (×2): 25 ug via INTRAVENOUS
  Administered 2024-05-28: 50 ug via INTRAVENOUS

## 2024-05-28 MED ORDER — FENTANYL CITRATE (PF) 100 MCG/2ML IJ SOLN
INTRAMUSCULAR | Status: DC | PRN
  Administered 2024-05-28 (×4): 50 ug via INTRAVENOUS

## 2024-05-28 MED ORDER — SODIUM CHLORIDE (PF) 0.9 % IJ SOLN
INTRAMUSCULAR | Status: DC | PRN
  Administered 2024-05-28: 71 mL via INTRAMUSCULAR

## 2024-05-28 MED ORDER — PANTOPRAZOLE SODIUM 40 MG PO TBEC
DELAYED_RELEASE_TABLET | ORAL | Status: AC
  Filled 2024-05-28: qty 1

## 2024-05-28 MED ORDER — GABAPENTIN 300 MG PO CAPS
600.0000 mg | ORAL_CAPSULE | Freq: Two times a day (BID) | ORAL | Status: DC
  Administered 2024-05-28: 600 mg via ORAL

## 2024-05-28 MED ORDER — DOCUSATE SODIUM 100 MG PO CAPS
100.0000 mg | ORAL_CAPSULE | Freq: Two times a day (BID) | ORAL | 0 refills | Status: AC
  Filled 2024-05-28: qty 10, 5d supply, fill #0

## 2024-05-28 MED ORDER — ENOXAPARIN SODIUM 30 MG/0.3ML IJ SOSY: 30.0000 mg | PREFILLED_SYRINGE | Freq: Two times a day (BID) | INTRAMUSCULAR | Status: DC

## 2024-05-28 MED ORDER — ACETAMINOPHEN 10 MG/ML IV SOLN: 1000.0000 mg | Freq: Once | INTRAVENOUS | Status: DC | PRN

## 2024-05-28 MED ORDER — ONDANSETRON 4 MG PO TBDP
4.0000 mg | ORAL_TABLET | Freq: Four times a day (QID) | ORAL | 0 refills | Status: AC | PRN
  Filled 2024-05-28: qty 10, 3d supply, fill #0

## 2024-05-28 MED ORDER — CEFAZOLIN SODIUM-DEXTROSE 2-4 GM/100ML-% IV SOLN
INTRAVENOUS | Status: AC
  Filled 2024-05-28: qty 100

## 2024-05-28 MED ORDER — NORTRIPTYLINE HCL 10 MG PO CAPS
10.0000 mg | ORAL_CAPSULE | Freq: Every day | ORAL | Status: DC
  Administered 2024-05-28: 10 mg via ORAL
  Filled 2024-05-28 (×2): qty 1

## 2024-05-28 MED ORDER — SUGAMMADEX SODIUM 200 MG/2ML IV SOLN
INTRAVENOUS | Status: DC | PRN
  Administered 2024-05-28: 400 mg via INTRAVENOUS

## 2024-05-28 MED ORDER — HYDROCODONE-ACETAMINOPHEN 5-325 MG PO TABS: 1.0000 | ORAL_TABLET | ORAL | Status: DC | PRN

## 2024-05-28 MED ORDER — TRAMADOL HCL 50 MG PO TABS
50.0000 mg | ORAL_TABLET | Freq: Four times a day (QID) | ORAL | Status: DC | PRN
  Administered 2024-05-29: 50 mg via ORAL
  Filled 2024-05-28: qty 1

## 2024-05-28 MED ORDER — LEVOTHYROXINE SODIUM 112 MCG PO TABS
112.0000 ug | ORAL_TABLET | Freq: Every day | ORAL | Status: DC
  Administered 2024-05-29: 112 ug via ORAL
  Filled 2024-05-28: qty 1

## 2024-05-28 MED ORDER — GLYCOPYRROLATE 0.2 MG/ML IJ SOLN
INTRAMUSCULAR | Status: AC
  Filled 2024-05-28: qty 1

## 2024-05-28 MED ORDER — ACETAMINOPHEN 325 MG PO TABS: 325.0000 mg | ORAL_TABLET | Freq: Four times a day (QID) | ORAL | Status: DC | PRN

## 2024-05-28 MED ORDER — DEXMEDETOMIDINE HCL IN NACL 80 MCG/20ML IV SOLN
INTRAVENOUS | Status: DC | PRN
  Administered 2024-05-28: 8 ug via INTRAVENOUS
  Administered 2024-05-28: 4 ug via INTRAVENOUS
  Administered 2024-05-28: 8 ug via INTRAVENOUS

## 2024-05-28 MED ORDER — ONDANSETRON HCL 4 MG/2ML IJ SOLN
INTRAMUSCULAR | Status: DC | PRN
Start: 2024-05-28 — End: 2024-05-28
  Administered 2024-05-28: 4 mg via INTRAVENOUS

## 2024-05-28 MED ORDER — HYDROMORPHONE HCL 1 MG/ML IJ SOLN
INTRAMUSCULAR | Status: DC | PRN
  Administered 2024-05-28: 1 mg via INTRAVENOUS

## 2024-05-28 MED ORDER — ASPIRIN 81 MG PO CHEW
81.0000 mg | CHEWABLE_TABLET | Freq: Every day | ORAL | Status: DC
  Administered 2024-05-29: 81 mg via ORAL

## 2024-05-28 MED ORDER — MORPHINE SULFATE (PF) 2 MG/ML IV SOLN: 0.5000 mg | INTRAVENOUS | Status: DC | PRN

## 2024-05-28 MED ORDER — LACTATED RINGERS IV SOLN: INTRAVENOUS | Status: DC

## 2024-05-28 MED ORDER — SODIUM CHLORIDE 0.9 % IV SOLN: INTRAVENOUS | Status: DC

## 2024-05-28 MED ORDER — ACETAMINOPHEN 500 MG PO TABS
1000.0000 mg | ORAL_TABLET | Freq: Three times a day (TID) | ORAL | 0 refills | Status: AC
  Filled 2024-05-28: qty 30, 5d supply, fill #0

## 2024-05-28 MED ORDER — ACETAMINOPHEN 10 MG/ML IV SOLN
INTRAVENOUS | Status: AC
  Filled 2024-05-28: qty 100

## 2024-05-28 MED ORDER — APIXABAN 2.5 MG PO TABS
2.5000 mg | ORAL_TABLET | Freq: Two times a day (BID) | ORAL | 0 refills | Status: AC
  Filled 2024-05-28: qty 56, 28d supply, fill #0

## 2024-05-28 MED ORDER — GABAPENTIN 300 MG PO CAPS
ORAL_CAPSULE | ORAL | Status: AC
  Filled 2024-05-28: qty 2

## 2024-05-28 MED ORDER — SURGIPHOR WOUND IRRIGATION SYSTEM - OPTIME: TOPICAL | Status: DC | PRN

## 2024-05-28 MED ORDER — CHLORHEXIDINE GLUCONATE 0.12 % MT SOLN
OROMUCOSAL | Status: AC
  Filled 2024-05-28: qty 15

## 2024-05-28 MED ORDER — ARIPIPRAZOLE 15 MG PO TABS
30.0000 mg | ORAL_TABLET | Freq: Every day | ORAL | Status: DC
  Administered 2024-05-28 – 2024-05-29 (×2): 30 mg via ORAL
  Filled 2024-05-28 (×2): qty 2

## 2024-05-28 MED ORDER — PHENOL 1.4 % MT LIQD: 1.0000 | OROMUCOSAL | Status: DC | PRN

## 2024-05-28 MED ORDER — DULOXETINE HCL 30 MG PO CPEP
60.0000 mg | ORAL_CAPSULE | Freq: Two times a day (BID) | ORAL | Status: DC
  Administered 2024-05-28 – 2024-05-29 (×2): 60 mg via ORAL
  Filled 2024-05-28 (×2): qty 2

## 2024-05-28 MED ORDER — OXYCODONE HCL 5 MG PO TABS
ORAL_TABLET | ORAL | Status: AC
  Filled 2024-05-28: qty 1

## 2024-05-28 MED ORDER — METOPROLOL TARTRATE 50 MG PO TABS
50.0000 mg | ORAL_TABLET | Freq: Every day | ORAL | Status: DC
  Administered 2024-05-28: 50 mg via ORAL
  Filled 2024-05-28: qty 1

## 2024-05-28 MED ORDER — DEXAMETHASONE SOD PHOSPHATE PF 10 MG/ML IJ SOLN
8.0000 mg | Freq: Once | INTRAMUSCULAR | Status: AC
  Administered 2024-05-28: 10 mg via INTRAVENOUS

## 2024-05-28 MED ORDER — DROPERIDOL 2.5 MG/ML IJ SOLN: 0.6250 mg | Freq: Once | INTRAMUSCULAR | Status: DC | PRN

## 2024-05-28 MED ORDER — CEFAZOLIN SODIUM-DEXTROSE 2-4 GM/100ML-% IV SOLN
2.0000 g | Freq: Four times a day (QID) | INTRAVENOUS | Status: AC
  Administered 2024-05-28 (×2): 2 g via INTRAVENOUS
  Filled 2024-05-28: qty 100

## 2024-05-28 MED ORDER — ACETAMINOPHEN 500 MG PO TABS
1000.0000 mg | ORAL_TABLET | Freq: Three times a day (TID) | ORAL | Status: DC
  Administered 2024-05-28 – 2024-05-29 (×2): 1000 mg via ORAL
  Filled 2024-05-28: qty 2

## 2024-05-28 MED ORDER — ACETAMINOPHEN 10 MG/ML IV SOLN
INTRAVENOUS | Status: DC | PRN
  Administered 2024-05-28: 1000 mg via INTRAVENOUS

## 2024-05-28 MED ORDER — ROSUVASTATIN CALCIUM 10 MG PO TABS
40.0000 mg | ORAL_TABLET | Freq: Every day | ORAL | Status: DC
  Administered 2024-05-28 – 2024-05-29 (×2): 40 mg via ORAL
  Filled 2024-05-28 (×2): qty 4

## 2024-05-28 MED ORDER — FENTANYL CITRATE (PF) 100 MCG/2ML IJ SOLN
INTRAMUSCULAR | Status: AC
  Filled 2024-05-28: qty 2

## 2024-05-28 MED ORDER — ALBUTEROL SULFATE (2.5 MG/3ML) 0.083% IN NEBU: 2.5000 mg | INHALATION_SOLUTION | Freq: Four times a day (QID) | RESPIRATORY_TRACT | Status: DC | PRN

## 2024-05-28 MED ORDER — LISINOPRIL 5 MG PO TABS
10.0000 mg | ORAL_TABLET | Freq: Every day | ORAL | Status: DC
  Administered 2024-05-28: 10 mg via ORAL

## 2024-05-28 MED ORDER — METOCLOPRAMIDE HCL 10 MG PO TABS: 5.0000 mg | ORAL_TABLET | Freq: Three times a day (TID) | ORAL | Status: DC | PRN

## 2024-05-28 MED ORDER — ROCURONIUM BROMIDE 100 MG/10ML IV SOLN
INTRAVENOUS | Status: DC | PRN
  Administered 2024-05-28: 70 mg via INTRAVENOUS
  Administered 2024-05-28: 20 mg via INTRAVENOUS

## 2024-05-28 MED ORDER — LISINOPRIL 5 MG PO TABS
ORAL_TABLET | ORAL | Status: AC
  Filled 2024-05-28: qty 2

## 2024-05-28 MED ORDER — ORAL CARE MOUTH RINSE: 15.0000 mL | Freq: Once | OROMUCOSAL | Status: AC

## 2024-05-28 MED ORDER — SODIUM CHLORIDE 0.9 % IR SOLN
Status: DC | PRN
  Administered 2024-05-28: 3000 mL

## 2024-05-28 MED ORDER — PANTOPRAZOLE SODIUM 40 MG PO TBEC
40.0000 mg | DELAYED_RELEASE_TABLET | Freq: Every day | ORAL | Status: DC
  Administered 2024-05-29: 40 mg via ORAL
  Filled 2024-05-28 (×2): qty 1

## 2024-05-28 MED ORDER — ONDANSETRON HCL 4 MG/2ML IJ SOLN
INTRAMUSCULAR | Status: AC
  Filled 2024-05-28: qty 2

## 2024-05-28 MED ORDER — KETOROLAC TROMETHAMINE 15 MG/ML IJ SOLN
7.5000 mg | Freq: Four times a day (QID) | INTRAMUSCULAR | Status: DC
  Administered 2024-05-28 – 2024-05-29 (×3): 7.5 mg via INTRAVENOUS
  Filled 2024-05-28 (×2): qty 1

## 2024-05-28 MED ORDER — PANTOPRAZOLE SODIUM 40 MG PO TBEC
40.0000 mg | DELAYED_RELEASE_TABLET | Freq: Every day | ORAL | Status: DC
  Administered 2024-05-28: 40 mg via ORAL

## 2024-05-28 MED ORDER — BUDESON-GLYCOPYRROL-FORMOTEROL 160-9-4.8 MCG/ACT IN AERO
2.0000 | INHALATION_SPRAY | Freq: Two times a day (BID) | RESPIRATORY_TRACT | Status: DC
  Administered 2024-05-28 – 2024-05-29 (×2): 2 via RESPIRATORY_TRACT
  Filled 2024-05-28: qty 5.9

## 2024-05-28 MED ORDER — PROPOFOL 10 MG/ML IV BOLUS
INTRAVENOUS | Status: AC
Start: 2024-05-28 — End: 2024-05-28
  Filled 2024-05-28: qty 20

## 2024-05-28 MED ORDER — PHENYLEPHRINE 80 MCG/ML (10ML) SYRINGE FOR IV PUSH (FOR BLOOD PRESSURE SUPPORT)
PREFILLED_SYRINGE | INTRAVENOUS | Status: AC
  Filled 2024-05-28: qty 10

## 2024-05-28 MED ORDER — CELECOXIB 200 MG PO CAPS
200.0000 mg | ORAL_CAPSULE | Freq: Two times a day (BID) | ORAL | 0 refills | Status: AC
  Filled 2024-05-28: qty 28, 14d supply, fill #0

## 2024-05-28 MED ORDER — MIDAZOLAM HCL (PF) 2 MG/2ML IJ SOLN
INTRAMUSCULAR | Status: DC | PRN
  Administered 2024-05-28: 2 mg via INTRAVENOUS

## 2024-05-28 MED ORDER — PROPOFOL 10 MG/ML IV BOLUS
INTRAVENOUS | Status: DC | PRN
  Administered 2024-05-28: 150 mg via INTRAVENOUS
  Administered 2024-05-28: 50 mg via INTRAVENOUS

## 2024-05-28 MED ORDER — HYDROMORPHONE HCL 1 MG/ML IJ SOLN
INTRAMUSCULAR | Status: AC
  Filled 2024-05-28: qty 1

## 2024-05-28 MED ORDER — ONDANSETRON HCL 4 MG PO TABS: 4.0000 mg | ORAL_TABLET | Freq: Four times a day (QID) | ORAL | Status: DC | PRN

## 2024-05-28 MED ORDER — TRANEXAMIC ACID-NACL 1000-0.7 MG/100ML-% IV SOLN
INTRAVENOUS | Status: AC
Start: 2024-05-28 — End: 2024-05-28
  Filled 2024-05-28: qty 100

## 2024-05-28 MED ORDER — OXYCODONE HCL 5 MG/5ML PO SOLN: 5.0000 mg | Freq: Once | ORAL | Status: AC | PRN

## 2024-05-28 MED ORDER — DOCUSATE SODIUM 100 MG PO CAPS
100.0000 mg | ORAL_CAPSULE | Freq: Two times a day (BID) | ORAL | Status: DC
  Administered 2024-05-28 – 2024-05-29 (×2): 100 mg via ORAL
  Filled 2024-05-28 (×2): qty 1

## 2024-05-28 MED ORDER — CEFAZOLIN SODIUM-DEXTROSE 2-4 GM/100ML-% IV SOLN
2.0000 g | INTRAVENOUS | Status: AC
  Administered 2024-05-28: 2 g via INTRAVENOUS

## 2024-05-28 MED ORDER — PROPOFOL 1000 MG/100ML IV EMUL
INTRAVENOUS | Status: AC
  Filled 2024-05-28: qty 100

## 2024-05-28 MED ORDER — DEXAMETHASONE SOD PHOSPHATE PF 10 MG/ML IJ SOLN: INTRAMUSCULAR | Status: DC | PRN

## 2024-05-28 MED ORDER — OXYCODONE HCL 5 MG PO TABS
5.0000 mg | ORAL_TABLET | Freq: Once | ORAL | Status: AC | PRN
  Administered 2024-05-28: 5 mg via ORAL

## 2024-05-28 MED ORDER — PROPOFOL 500 MG/50ML IV EMUL
INTRAVENOUS | Status: DC | PRN
  Administered 2024-05-28: 125 ug/kg/min via INTRAVENOUS

## 2024-05-28 MED ORDER — MENTHOL 3 MG MT LOZG: 1.0000 | LOZENGE | OROMUCOSAL | Status: DC | PRN

## 2024-05-28 MED ORDER — LIOTHYRONINE SODIUM 5 MCG PO TABS
10.0000 ug | ORAL_TABLET | Freq: Every day | ORAL | Status: DC
  Administered 2024-05-28 – 2024-05-29 (×2): 10 ug via ORAL
  Filled 2024-05-28 (×2): qty 2

## 2024-05-28 MED ORDER — TRANEXAMIC ACID-NACL 1000-0.7 MG/100ML-% IV SOLN
INTRAVENOUS | Status: AC
  Filled 2024-05-28: qty 100

## 2024-05-28 MED ORDER — CHLORHEXIDINE GLUCONATE 0.12 % MT SOLN
15.0000 mL | Freq: Once | OROMUCOSAL | Status: AC
  Administered 2024-05-28: 15 mL via OROMUCOSAL

## 2024-05-28 MED ORDER — GLYCOPYRROLATE 0.2 MG/ML IJ SOLN
INTRAMUSCULAR | Status: DC | PRN
  Administered 2024-05-28: .2 mg via INTRAVENOUS

## 2024-05-28 MED ORDER — ONDANSETRON HCL 4 MG/2ML IJ SOLN: 4.0000 mg | Freq: Four times a day (QID) | INTRAMUSCULAR | Status: DC | PRN

## 2024-05-28 MED ORDER — APIXABAN 2.5 MG PO TABS
2.5000 mg | ORAL_TABLET | Freq: Two times a day (BID) | ORAL | Status: DC
  Administered 2024-05-29: 2.5 mg via ORAL

## 2024-05-28 MED ORDER — ALBUTEROL SULFATE HFA 108 (90 BASE) MCG/ACT IN AERS: 1.0000 | INHALATION_SPRAY | Freq: Four times a day (QID) | RESPIRATORY_TRACT | Status: DC | PRN

## 2024-05-28 MED ORDER — TRANEXAMIC ACID-NACL 1000-0.7 MG/100ML-% IV SOLN
1000.0000 mg | INTRAVENOUS | Status: AC
  Administered 2024-05-28 (×2): 1000 mg via INTRAVENOUS

## 2024-05-28 MED ORDER — TRAMADOL HCL 50 MG PO TABS
50.0000 mg | ORAL_TABLET | Freq: Four times a day (QID) | ORAL | 0 refills | Status: AC | PRN
  Filled 2024-05-28: qty 30, 8d supply, fill #0

## 2024-05-28 MED ORDER — MIDAZOLAM HCL 2 MG/2ML IJ SOLN
INTRAMUSCULAR | Status: AC
  Filled 2024-05-28: qty 2

## 2024-05-28 MED ORDER — METOCLOPRAMIDE HCL 5 MG/ML IJ SOLN: 5.0000 mg | Freq: Three times a day (TID) | INTRAMUSCULAR | Status: DC | PRN

## 2024-05-28 MED ORDER — KETOROLAC TROMETHAMINE 30 MG/ML IJ SOLN
INTRAMUSCULAR | Status: AC
  Filled 2024-05-28: qty 1

## 2024-05-28 MED ORDER — COLESTIPOL HCL 1 G PO TABS
2.0000 g | ORAL_TABLET | Freq: Two times a day (BID) | ORAL | Status: DC
  Administered 2024-05-28 – 2024-05-29 (×2): 2 g via ORAL
  Filled 2024-05-28 (×2): qty 2

## 2024-05-28 SURGICAL SUPPLY — 57 items
BLADE PATELLA REAM PILOT HOLE (MISCELLANEOUS) IMPLANT
BLADE SAW 90X13X1.19 OSCILLAT (BLADE) IMPLANT
BLADE SAW SAG 25X90X1.19 (BLADE) ×2 IMPLANT
BLADE SAW SAG 29X58X.64 (BLADE) ×2 IMPLANT
BNDG ELASTIC 6INX 5YD STR LF (GAUZE/BANDAGES/DRESSINGS) ×2 IMPLANT
BOWL CEMENT MIX W/ADAPTER (MISCELLANEOUS) IMPLANT
BRUSH SCRUB EZ PLAIN DRY (MISCELLANEOUS) IMPLANT
CHLORAPREP W/TINT 26 (MISCELLANEOUS) ×4 IMPLANT
COMPONENT FEM KNEE PS STD 6 LT (Joint) IMPLANT
COMPONENT PATELLA 3 PEG 29X9 (Joint) IMPLANT
COMPONET TIB PS KNEE C 0D LT (Joint) IMPLANT
COOLER ICEMAN CLASSIC (MISCELLANEOUS) ×2 IMPLANT
CUFF TRNQT CYL 24X4X16.5-23 (TOURNIQUET CUFF) IMPLANT
CUFF TRNQT CYL 30X4X21-28X (TOURNIQUET CUFF) IMPLANT
DERMABOND ADVANCED .7 DNX12 (GAUZE/BANDAGES/DRESSINGS) ×2 IMPLANT
DRAPE SHEET LG 3/4 BI-LAMINATE (DRAPES) ×4 IMPLANT
DRSG MEPILEX SACRM 8.7X9.8 (GAUZE/BANDAGES/DRESSINGS) ×2 IMPLANT
DRSG OPSITE POSTOP 4X10 (GAUZE/BANDAGES/DRESSINGS) IMPLANT
DRSG OPSITE POSTOP 4X8 (GAUZE/BANDAGES/DRESSINGS) IMPLANT
ELECTRODE REM PT RTRN 9FT ADLT (ELECTROSURGICAL) ×2 IMPLANT
GLOVE BIO SURGEON STRL SZ8 (GLOVE) ×2 IMPLANT
GLOVE BIOGEL PI IND STRL 8 (GLOVE) ×2 IMPLANT
GLOVE PI ORTHO PRO STRL 7.5 (GLOVE) ×4 IMPLANT
GLOVE PI ORTHO PRO STRL SZ8 (GLOVE) ×4 IMPLANT
GLOVE SURG SYN 7.5 PF PI (GLOVE) ×2 IMPLANT
GOWN SRG XL LONG LVL 3 NONREIN (GOWNS) ×2 IMPLANT
GOWN SRG XL LVL 3 NONREINFORCE (GOWNS) ×2 IMPLANT
GOWN STRL REUS W/ TWL LRG LVL3 (GOWN DISPOSABLE) ×2 IMPLANT
HOOD PEEL AWAY T7 (MISCELLANEOUS) ×4 IMPLANT
INSERT ARTISURF SZ 6-7 LT (Insert) IMPLANT
KIT TURNOVER KIT A (KITS) ×2 IMPLANT
MANIFOLD NEPTUNE II (INSTRUMENTS) ×2 IMPLANT
MARKER SKIN DUAL TIP RULER LAB (MISCELLANEOUS) ×2 IMPLANT
MAT ABSORB FLUID 56X50 GRAY (MISCELLANEOUS) ×2 IMPLANT
NDL HYPO 21X1.5 SAFETY (NEEDLE) ×2 IMPLANT
NEEDLE HYPO 21X1.5 SAFETY (NEEDLE) ×1 IMPLANT
PACK TOTAL KNEE (MISCELLANEOUS) ×2 IMPLANT
PAD ARMBOARD POSITIONER FOAM (MISCELLANEOUS) ×6 IMPLANT
PAD COLD UNI WRAP-ON (PAD) ×2 IMPLANT
PENCIL SMOKE EVACUATOR (MISCELLANEOUS) ×2 IMPLANT
PIN DRILL HDLS TROCAR 75 4PK (PIN) IMPLANT
SCREW FEMALE HEX FIX 25X2.5 (ORTHOPEDIC DISPOSABLE SUPPLIES) IMPLANT
SCREW HEX HEADED 3.5X27 DISP (ORTHOPEDIC DISPOSABLE SUPPLIES) IMPLANT
SLEEVE SCD COMPRESS KNEE MED (STOCKING) ×2 IMPLANT
SOL .9 NS 3000ML IRR UROMATIC (IV SOLUTION) ×2 IMPLANT
SOLN STERILE WATER BTL 1000 ML (IV SOLUTION) ×2 IMPLANT
SOLUTION IRRIG SURGIPHOR (IV SOLUTION) ×2 IMPLANT
STOCKINETTE IMPERV 14X48 (MISCELLANEOUS) ×2 IMPLANT
SUT STRATAFIX 14 PDO 36 VLT (SUTURE) ×2 IMPLANT
SUT VIC AB 0 CT1 36 (SUTURE) ×2 IMPLANT
SUT VIC AB 2-0 CT2 27 (SUTURE) ×4 IMPLANT
SUTURE STRATA SPIR 4-0 18 (SUTURE) ×2 IMPLANT
SUTURE VICRYL 1-0 27IN ABS (SUTURE) ×2 IMPLANT
SYR 20ML LL LF (SYRINGE) ×4 IMPLANT
TAPE CLOTH 3X10 WHT NS LF (GAUZE/BANDAGES/DRESSINGS) ×2 IMPLANT
TIP FAN IRRIG PULSAVAC PLUS (DISPOSABLE) ×2 IMPLANT
TRAP FLUID SMOKE EVACUATOR (MISCELLANEOUS) ×2 IMPLANT

## 2024-05-28 NOTE — Anesthesia Procedure Notes (Signed)
 Procedure Name: Intubation Date/Time: 05/28/2024 12:17 PM  Performed by: Santonio Speakman, CRNAPre-anesthesia Checklist: Patient identified, Emergency Drugs available, Suction available and Patient being monitored Patient Re-evaluated:Patient Re-evaluated prior to induction Oxygen Delivery Method: Circle System Utilized Preoxygenation: Pre-oxygenation with 100% oxygen Induction Type: IV induction Ventilation: Mask ventilation without difficulty Laryngoscope Size: McGrath and 3 Grade View: Grade I Tube type: Oral Tube size: 7.0 mm Number of attempts: 1 Airway Equipment and Method: Stylet and Oral airway Placement Confirmation: ETT inserted through vocal cords under direct vision, positive ETCO2 and breath sounds checked- equal and bilateral Secured at: 21 cm Tube secured with: Tape Dental Injury: Teeth and Oropharynx as per pre-operative assessment  Comments: Easy, atraumatic intubation. Head and neck midline. Lips, teeth and tongue unchanged.

## 2024-05-28 NOTE — Discharge Summary (Signed)
 Physician Discharge Summary  Patient ID: Alexis Christensen MRN: [id number] DOB/AGE: [birth year] 53 y.o.  Admit date: 05/28/2024 Discharge date: 05/29/2024  Admission Diagnoses:  Primary localized osteoarthritis of left knee [M17.12] Complex tear of medial meniscus of left knee as current injury, initial encounter [S83.232A] S/P TKR (total knee replacement), left [Z96.652]   Discharge Diagnoses: Patient Active Problem List   Diagnosis Date Noted   S/P TKR (total knee replacement), left 05/28/2024   Body mass index (BMI) 40.0-44.9, adult (HCC) 05/26/2024   Acute non-recurrent frontal sinusitis 04/30/2024   Hiatal hernia 10/07/2023   Generalized edema 05/09/2023   Gastroesophageal reflux disease 10/11/2022   Mood disorder 08/28/2022   B12 deficiency 07/03/2022   Pre-diabetes 01/01/2022   Diastolic congestive heart failure (HCC) 05/23/2021   Impaired glucose tolerance 11/29/2020   Multinodular goiter 06/04/2018   Bilateral inguinal hernia without obstruction or gangrene    Iron deficiency anemia 07/11/2016   ANA positive 06/13/2016   Bilateral hand pain 06/13/2016   Arthralgia of temporomandibular joint 05/23/2016   Low back pain 05/23/2016   Lymphedema 04/26/2016   Chronic venous insufficiency 04/26/2016   Hyperlipidemia 01/11/2016   Chronic diarrhea 09/07/2015   Hypothyroidism 09/07/2015   Class 2 severe obesity with serious comorbidity and body mass index (BMI) of 39.0 to 39.9 in adult, unspecified obesity type 09/07/2015   Vitamin D  deficiency 09/07/2015   Bipolar mixed affective disorder, moderate (HCC) 09/05/2015   SUI (stress urinary incontinence, female) 05/18/2015   Allergic rhinitis 04/28/2015   Anemia 02/21/2015   Carpal tunnel syndrome 09/15/2014   Chronic left shoulder pain 09/15/2014   Plantar fascial fibromatosis 05/05/2014   Gonalgia 12/30/2013   Lipoma of arm 05/11/2013   Degeneration of lumbar intervertebral disc 12/18/2012   Migraine with aura 11/16/2011     Past Medical History:  Diagnosis Date   (HFpEF) heart failure with preserved ejection fraction (HCC)    Acute deep vein thrombosis (DVT) of right lower extremity (HCC) 2016   ADHD    Adopted    ANA positive    Anemia    Anxiety    Arthritis    Asthma    B12 deficiency    Bilateral swelling of feet and ankles    Bipolar 1 disorder (HCC)    Carpal tunnel syndrome of left wrist    Chest pain    Chronic diarrhea 09/07/2015   Chronic left shoulder pain    Clostridioides difficile infection 04/06/2019   Depression    Diverticulosis    Edema leg    GERD (gastroesophageal reflux disease)    Heart murmur    Helicobacter pylori (H. pylori) infection 08/24/2015   Hiatal hernia    History of 2019 novel coronavirus disease (COVID-19) 09/09/2020   a.) PCR testing (+): 09/09/2020, 01/12/2021, 07/17/2021   HLD (hyperlipidemia)    HTN (hypertension)    Hx MRSA infection    Hypothyroidism    Influenza A 10/01/2022   Lipoma of arm 05/11/2013   Lower extremity edema    Lumbago    Lymphedema 04/26/2016   Medial meniscus tear (LEFT)    Migraine with aura    Multinodular goiter    Nephrolithiasis    Opioid dependence (HCC)    a.) on buprenorphine   OSA on CPAP    Plantar fascial fibromatosis    Pneumonia due to COVID-19 virus    Post traumatic stress disorder (PTSD)    Pre-diabetes    PVD (peripheral vascular disease)    Seasonal allergies  Shortness of breath on exertion    Stress incontinence    Swelling of limb 04/26/2016   Urge incontinence    Venous insufficiency    Venous ulcer (HCC)    Vitamin D  deficiency      Transfusion: none   Consultants (if any):   Discharged Condition: Improved  Hospital Course: Alexis Christensen is an 53 y.o. female who was admitted 05/28/2024 with a diagnosis of S/P TKR (total knee replacement), left and went to the operating room on 05/28/2024 and underwent the above named procedures.    Surgeries: Procedure(s): ARTHROPLASTY, KNEE,  TOTAL on 05/28/2024 Patient tolerated the surgery well. Taken to PACU where she was stabilized and then transferred to the orthopedic floor.  Started on Eliquis 2.5 mg BID, TEDs and SCDs applied bilaterally. Heels elevated on bed. No evidence of DVT. Negative Homan. Physical therapy started on day #1 for gait training and transfer. OT started day #1 for ADL and assisted devices.  Patient's IV was d/c on day #1. Patient was able to safely and independently complete all PT goals. PT recommending discharge to home.    On post op day #1 patient was stable and ready for discharge to Home with HHPT.  Implants:  Femur: Persona Size 6 CR PPS   Tibia: Persona Size C OsseoTi  Poly: 10mm MC  Patella: 29x35mm symmetric OsseoTi    She was given perioperative antibiotics:  Anti-infectives (From admission, onward)    Start     Dose/Rate Route Frequency Ordered Stop   05/28/24 1830  ceFAZolin  (ANCEF ) IVPB 2g/100 mL premix        2 g 200 mL/hr over 30 Minutes Intravenous Every 6 hours 05/28/24 1448 05/28/24 2330   05/28/24 1100  ceFAZolin  (ANCEF ) IVPB 2g/100 mL premix        2 g 200 mL/hr over 30 Minutes Intravenous On call to O.R. 05/28/24 1053 05/28/24 1250     .  She was given sequential compression devices, early ambulation, and TEDs Eliquis for DVT prophylaxis.  She benefited maximally from the hospital stay and there were no complications.    Recent vital signs:  Vitals:   05/29/24 0445 05/29/24 0756  BP: 113/62 108/62  Pulse: 61 66  Resp: 14 18  Temp: 98.2 F (36.8 C) 98.1 F (36.7 C)  SpO2: 93% 90%    Recent laboratory studies:  Lab Results  Component Value Date   HGB 10.5 (L) 05/29/2024   HGB 11.1 (L) 05/18/2024   HGB 11.1 09/27/2023   Lab Results  Component Value Date   WBC 12.5 (H) 05/29/2024   PLT 290 05/29/2024   Lab Results  Component Value Date   INR 1.03 04/03/2016   Lab Results  Component Value Date   NA 137 05/29/2024   K 4.1 05/29/2024   CL 102  05/29/2024   CO2 28 05/29/2024   BUN 10 05/29/2024   CREATININE 0.57 05/29/2024   GLUCOSE 139 (H) 05/29/2024    Discharge Medications:   Allergies as of 05/29/2024       Reactions   Geodon [ziprasidone Hydrochloride] Other (See Comments)   Numbness ,sob, headaches, blurred vision   Sulfacetamide Sodium Hives   Ziprasidone Anaphylaxis, Other (See Comments)   Other reaction(s): Numbness ,sob, headaches, blurred vision   Ace Inhibitors Rash   Erythromycin Rash, Hives   Hydromorphone Rash   Lamictal [lamotrigine] Rash   Other reaction(s): Unknown   Strawberry (diagnostic) Rash   Sulfa Antibiotics Hives, Rash  Medication List     STOP taking these medications    aspirin 81 MG chewable tablet       TAKE these medications    acetaminophen  500 MG tablet Commonly known as: TYLENOL  Take 2 tablets (1,000 mg total) by mouth every 8 (eight) hours.   albuterol  108 (90 Base) MCG/ACT inhaler Commonly known as: VENTOLIN  HFA Inhale 1 puff into the lungs every 6 (six) hours as needed for wheezing or shortness of breath.   apixaban 2.5 MG Tabs tablet Commonly known as: Eliquis Take 1 tablet (2.5 mg total) by mouth 2 (two) times daily for 28 days.   ARIPiprazole 30 MG tablet Commonly known as: ABILIFY Take 30 mg by mouth daily.   Breztri Aerosphere 160-9-4.8 MCG/ACT Aero inhaler Generic drug: budesonide-glycopyrrolate -formoterol Inhale 2 puffs into the lungs in the morning and at bedtime.   celecoxib 200 MG capsule Commonly known as: CeleBREX Take 1 capsule (200 mg total) by mouth 2 (two) times daily for 14 days. What changed:  medication strength how much to take   colestipol  1 g tablet Commonly known as: COLESTID  TAKE 2 TABLETS(2 GRAMS) BY MOUTH TWICE DAILY   dicyclomine 10 MG capsule Commonly known as: BENTYL TAKE 1 CAPSULE BY MOUTH EVERY 8 HOURS AS NEEDED FOR ABDOMINAL PAIN   docusate sodium 100 MG capsule Commonly known as: COLACE Take 1 capsule (100  mg total) by mouth 2 (two) times daily.   DULoxetine 60 MG capsule Commonly known as: CYMBALTA Take 60 mg by mouth 2 (two) times daily.   esomeprazole  40 MG capsule Commonly known as: NexIUM  Take 1 capsule (40 mg total) by mouth 2 (two) times daily before a meal.   gabapentin  600 MG tablet Commonly known as: NEURONTIN  Take 600 mg by mouth 2 (two) times daily.   hydrOXYzine  25 MG tablet Commonly known as: ATARAX  Take 50 mg by mouth every 8 (eight) hours as needed for itching.   levothyroxine  200 MCG tablet Commonly known as: SYNTHROID  Take 200 mcg by mouth daily before breakfast.   levothyroxine  112 MCG tablet Commonly known as: SYNTHROID  Take 112 mcg by mouth daily before breakfast.   liothyronine 5 MCG tablet Commonly known as: CYTOMEL Take 10 mcg by mouth daily.   lisinopril  10 MG tablet Commonly known as: ZESTRIL  Take 1 tablet (10 mg total) by mouth daily.   metoprolol tartrate 50 MG tablet Commonly known as: LOPRESSOR Take 50 mg by mouth daily in the afternoon.   nortriptyline 10 MG capsule Commonly known as: PAMELOR Take 10 mg in the morning   nystatin  powder Commonly known as: nystatin  Apply 1 Application topically 3 (three) times daily.   ondansetron  4 MG tablet Commonly known as: ZOFRAN  Take 1 tablet (4 mg total) by mouth every 6 (six) hours as needed for nausea.   oxyCODONE  5 MG immediate release tablet Commonly known as: Roxicodone  Take 1 tablet (5 mg total) by mouth every 6 (six) hours as needed for breakthrough pain.   rosuvastatin 40 MG tablet Commonly known as: CRESTOR Take 40 mg by mouth daily.   spironolactone 25 MG tablet Commonly known as: ALDACTONE Take 1 tablet (25 mg total) by mouth once for 1 dose.   traMADol  50 MG tablet Commonly known as: ULTRAM  Take 1 tablet (50 mg total) by mouth every 6 (six) hours as needed for moderate pain (pain score 4-6).   triamcinolone  cream 0.1 % Commonly known as: KENALOG  Apply 1 Application  topically 2 (two) times daily.   trimethoprim -polymyxin b   ophthalmic solution Commonly known as: Polytrim  Place 2 drops into the left eye every 4 (four) hours while awake x 5 days               Durable Medical Equipment  (From admission, onward)           Start     Ordered   05/28/24 1500  For home use only DME Walker rolling  Once       Question Answer Comment  Walker: With 5 Inch Wheels   Patient needs a walker to treat with the following condition Total knee replacement status      05/28/24 1459   05/28/24 1500  For home use only DME 3 n 1  Once        05/28/24 1459            Diagnostic Studies: DG Knee Left Port Result Date: 05/28/2024 CLINICAL DATA:  Left knee replacement. EXAM: PORTABLE LEFT KNEE - 1-2 VIEW COMPARISON:  12/13/2017 FINDINGS: Evidence of patient's recent left total knee arthroplasty with prosthetic components intact and normally located. Remainder of the exam is unremarkable. IMPRESSION: Expected changes post left total knee arthroplasty. Electronically Signed   By: Toribio Agreste M.D.   On: 05/28/2024 15:21   PERIPHERAL VASCULAR CATHETERIZATION Result Date: 05/19/2024 See surgical note for result.   Disposition:  There are no questions and answers to display.           Follow-up Information     Charlene Debby BROCKS, PA-C Follow up in 2 week(s).   Specialties: Orthopedic Surgery, Emergency Medicine Contact information: [address] [place] [address] [phone]                  Signed: Debby BROCKS Charlene 05/29/2024, 8:02 AM

## 2024-05-28 NOTE — Discharge Instructions (Signed)
 Instructions after Total Knee Replacement   Arthea Sheer M.D.     Dept. of Orthopaedics & Sports Medicine  [facility]  [address]  [place], [place]  [address]  Phone: [phone]   Fax: [phone]    DIET: Drink plenty of non-alcoholic fluids. Resume your normal diet. Include foods high in fiber.  ACTIVITY:  You may use crutches or a walker with weight-bearing as tolerated, unless instructed otherwise. You may be weaned off of the walker or crutches by your Physical Therapist.  Do NOT place pillows under the knee. Anything placed under the knee could limit your ability to straighten the knee.   Continue doing gentle exercises. Exercising will reduce the pain and swelling, increase motion, and prevent muscle weakness.   Please continue to use the TED compression stockings for 2 weeks. You may remove the stockings at night, but should reapply them in the morning. Do not drive or operate any equipment until instructed.  WOUND CARE:  Continue to use the PolarCare or ice packs periodically to reduce pain and swelling. You may begin showering 3 days after surgery with honeycomb dressing. Remove honeycomb dressing 7 days after surgery and continue showering. Allow dermabond to fall off on its own.  MEDICATIONS: You may resume your regular medications. Please take the pain medication as prescribed on the medication. Do not take pain medication on an empty stomach. You have been given a prescription for a blood thinner (Eliquis) Do not drive or drink alcoholic beverages when taking pain medications.  POSTOPERATIVE CONSTIPATION PROTOCOL Constipation - defined medically as fewer than three stools per week and severe constipation as less than one stool per week.  One of the most common issues patients have following surgery is constipation.  Even if you have a regular bowel pattern at home, your normal regimen is likely to be disrupted due to multiple reasons following  surgery.  Combination of anesthesia, postoperative narcotics, change in appetite and fluid intake all can affect your bowels.  In order to avoid complications following surgery, here are some recommendations in order to help you during your recovery period.  Colace (docusate) - Pick up an over-the-counter form of Colace or another stool softener and take twice a day as long as you are requiring postoperative pain medications.  Take with a full glass of water daily.  If you experience loose stools or diarrhea, hold the colace until you stool forms back up.  If your symptoms do not get better within 1 week or if they get worse, check with your doctor.  Dulcolax (bisacodyl) - Pick up over-the-counter and take as directed by the product packaging as needed to assist with the movement of your bowels.  Take with a full glass of water.  Use this product as needed if not relieved by Colace only.   MiraLax (polyethylene glycol) - Pick up over-the-counter to have on hand.  MiraLax is a solution that will increase the amount of water in your bowels to assist with bowel movements.  Take as directed and can mix with a glass of water, juice, soda, coffee, or tea.  Take if you go more than two days without a movement. Do not use MiraLax more than once per day. Call your doctor if you are still constipated or irregular after using this medication for 7 days in a row.  If you continue to have problems with postoperative constipation, please contact the office for further assistance and recommendations.  If you experience the worst  abdominal pain ever or develop nausea or vomiting, please contact the office immediatly for further recommendations for treatment.   CALL THE OFFICE FOR: Temperature above 101 degrees Excessive bleeding or drainage on the dressing. Excessive swelling, coldness, or paleness of the toes. Persistent nausea and vomiting.  FOLLOW-UP:  You should have an appointment to return to the office  in 14 days after surgery. Arrangements have been made for continuation of Physical Therapy (either home therapy or outpatient therapy).

## 2024-05-28 NOTE — Transfer of Care (Signed)
 Immediate Anesthesia Transfer of Care Note  Patient: Alexis Christensen  Procedure(s) Performed: ARTHROPLASTY, KNEE, TOTAL (Left: Knee)  Patient Location: PACU  Anesthesia Type:General  Level of Consciousness: drowsy  Airway & Oxygen Therapy: Patient Spontanous Breathing and Patient connected to face mask oxygen  Post-op Assessment: Report given to RN and Post -op Vital signs reviewed and stable  Post vital signs: Reviewed and stable  Last Vitals:  Vitals Value Taken Time  BP 159/104 05/28/24 14:12  Temp    Pulse 81 05/28/24 14:15  Resp 16 05/28/24 14:15  SpO2 100 % 05/28/24 14:15  Vitals shown include unfiled device data.  Last Pain:  Vitals:   05/28/24 1055  TempSrc: Temporal  PainSc: 7          Complications: No notable events documented.

## 2024-05-28 NOTE — Interval H&P Note (Signed)
 Patient history and physical updated. Consent reviewed including risks, benefits, and alternatives to surgery. Patient agrees with above plan to proceed with left total knee arthroplasty.

## 2024-05-28 NOTE — H&P (Signed)
 History of Present Illness: The patient is an 53 y.o. female presents today for history and physical for left total knee arthroplasty with Dr. Lorelle on 05/28/2024. She has severe left knee pain that has been chronic and interfering with her quality of life and activities daily living. She has arthritic changes seen on MRI throughout the left knee along with a complex medial meniscus tear. She was evaluated by sports medicine surgeon but felt as if arthroplasty would be best due to severity and complexity of changes throughout her left knee. Patient is seen Dr. Lorelle discussed total knee arthroplasty and agreed and consented to procedure. She had successful right total knee arthroplasty 11 years ago. Patient's pain is moderate to severe mostly along the medial and patellofemoral region. She has underwent conservative treatment with no improvement. She has a history of a blood clot in her right leg and is scheduled for IVC filter.  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 She watches her grand children for recreation. She does have a history of issues with pain medications and is on Suboxone.  The patient denies fevers, chills, numbness, tingling, shortness of breath, chest pain, recent illness, or any trauma.  Patient is a non-smoker well-controlled diabetic with an A1c of 6.2 and has a BMI of 42 today  She does have a history of a DVT on the top of one of her legs but can remember which, she is not currently on any blood thinners.  Past Medical History: Past Medical History:  Diagnosis Date  Anemia  Anxiety  Asthma without status asthmaticus (HHS-HCC)  Bipolar disorder (CMS/HHS-HCC)  Depression  DVT (deep venous thrombosis) (CMS/HHS-HCC)  History of chickenpox  History of suicide attempt  Hyperlipidemia  Hypothyroidism  Migraines  Osteoarthritis   Past Surgical History: Past Surgical History:  Procedure Laterality Date  CHOLECYSTECTOMY 2004  KNEE ARTHROSCOPY Right  07/06/2008  Right knee arthroscopy, partial medial meniscectomy, arthroscopic lateral release.  KNEE ARTHROSCOPY Right 06/07/2009  Right knee arthroscopy, partial medial meniscectomy.  JOINT REPLACEMENT Right 11/06/2012  Right total knee replacement.  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  Right wrist surgery x5   Past Family History: Family History  Adopted: Yes  Problem Relation Age of Onset  Alcohol abuse Mother  Alcohol abuse Father  Stroke Sister  Colon cancer Neg Hx  Colon polyps Neg Hx   Medications: Current Outpatient Medications  Medication Sig Dispense Refill  albuterol  90 mcg/actuation inhaler Inhale 2 inhalations into the lungs every 6 (six) hours as needed 1 each 1  amitriptyline (ELAVIL) 75 MG tablet TAKE 1 TABLET BY MOUTH EVERY DAY AT NIGHT 90 tablet 0  ARIPiprazole (ABILIFY) 30 MG tablet Take 30 mg by mouth once daily  aspirin 81 MG chewable tablet Aspirin 81 MG Oral Tablet Chewable QTY: 30 tablet Days: 30 Refills: 0 Written: 01/23/21 Patient Instructions: once a day  celecoxib (CELEBREX) 200 MG capsule Take 1 capsule (200 mg total) by mouth 2 (two) times daily 30 capsule  0  colestipoL  (COLESTID ) 1 gram tablet TAKE 2 TABLETS (2 G TOTAL) BY MOUTH 2 (TWO) TIMES DAILY TAKE AT LUNCH TIME. 360 tablet 0  dicyclomine (BENTYL) 10 mg capsule  DULoxetine (CYMBALTA) 60 MG DR capsule Take 60 mg by mouth  2 (two) times daily  famotidine  (PEPCID ) 20 MG tablet Take 20 mg by mouth 2 (two) times daily  gabapentin  600 mg Tb24 600 mg 2 (two) times daily One tablet by mouth twice a day and two at bedtime  hydrOXYzine  (VISTARIL ) 50 MG capsule  levothyroxine  (SYNTHROID ) 112 MCG tablet Take 2 tablets (224 mcg total) by mouth once daily Take on an empty stomach with a glass of water at least 30-60 minutes before breakfast. 180 tablet 3  liothyronine (CYTOMEL) 5 MCG tablet Take 2 tablets (10 mcg total) by mouth once daily Take on an empty stomach with a glass of water at least 30-60 minutes before breakfast. 180 tablet 4  lisinopriL  (ZESTRIL ) 5 MG tablet Take 5 mg by mouth once daily  metoprolol tartrate (LOPRESSOR) 50 MG tablet Take 50 mg by mouth once daily  mupirocin  (BACTROBAN ) 2 % ointment Apply 2 g topically 2 (two) times daily APPLY TO AFFECTED AREA  pantoprazole  (PROTONIX ) 40 MG DR tablet Take by mouth 2 (two) times daily before meals  tirzepatide  (ZEPBOUND ) 2.5 mg/0.5 mL pen injector Inject 2.5 mg subcutaneously every 7 (seven) days  buprenorphine-naloxone (SUBOXONE) 8-2 mg SL film Place under the tongue 3 (three) times daily (Patient not taking: Reported on 05/13/2024)  celecoxib (CELEBREX) 100 MG capsule TAKE 1 CAPSULE(100 MG) BY MOUTH TWICE DAILY (Patient not taking: Reported on 05/13/2024) 60 capsule 1  ciprofloxacin  HCl (CIPRO ) 250 MG tablet Take 250 mg by mouth once daily (Patient not taking: Reported on 05/13/2024)  cyclobenzaprine  (FLEXERIL ) 5 MG tablet Take 5 mg by mouth 3 (three) times daily as needed for Muscle spasms (Patient not taking: Reported on 05/13/2024)  fluticasone  propionate (FLONASE ) 50 mcg/actuation nasal spray Place 1 spray into both nostrils once daily (Patient not taking: Reported on 05/13/2024)  FUROsemide (LASIX) 40 MG tablet Take by mouth once daily (Patient not taking: Reported on 05/13/2024)  ketotifen  (ZADITOR ) 0.025 % (0.035 %) ophthalmic solution Place 1 drop into both  eyes 2 (two) times daily (Patient not taking: Reported on 05/13/2024)  KLOR-CON  M10 10 mEq ER tablet Take 10 mEq by mouth once daily (Patient not taking: Reported on 05/13/2024)  methocarbamoL  (ROBAXIN ) 500 MG tablet Take 500 mg by mouth 3 (three) times daily (Patient not taking: Reported on 05/13/2024)  nitrofurantoin  (MACRODANTIN ) 100 MG capsule Take 1 capsule (100 mg total) by mouth daily at 6 (six) AM. (Patient not taking: Reported on 05/13/2024)  nitrofurantoin , macrocrystal-monohydrate, (MACROBID ) 100 MG capsule (Patient not taking: Reported on 05/13/2024)  nortriptyline (PAMELOR) 10 MG capsule Take 10 mg in the morning (Patient not taking: Reported on 05/13/2024) 30 capsule 2  nortriptyline (PAMELOR) 50 MG capsule Take 1 capsule (50 mg total) by mouth at bedtime for 30 days Discontinue amitriptyline (Patient not taking: Reported on 05/13/2024) 30 capsule 2  nortriptyline (PAMELOR) 50 MG capsule Take 1 capsule (50 mg total) by mouth at bedtime (Patient not taking: Reported on 05/13/2024) 30 capsule 1  rosuvastatin (CRESTOR) 40 MG tablet Rosuvastatin Calcium 40 MG Oral Tablet QTY: 90 tablet Days: 90 Refills: 1 Written: 03/09/19 Patient Instructions: Take 1 tablet by mouth nightly at bedtime for high cholesterol (Patient not taking: Reported on 05/13/2024)  spironolactone (ALDACTONE) 25 MG tablet TAKE 1/2 (HALF) TABLET BY  MOUTH ONCE DAILY. (Patient not taking: Reported on 05/13/2024)   No current facility-administered medications for this visit.   Allergies: Allergies  Allergen Reactions  Sulfacetamide Sodium Hives  Ziprasidone Anaphylaxis and Other (See Comments)  Numbness ,sob, headaches, blurred vision  Other reaction(s): Other (See Comments), Unknown Numbness ,sob, headaches, blurred vision Numbness ,sob, headaches, blurred vision  Other reaction(s): Other (See Comments), Unknown Numbness ,sob, headaches, blurred vision Numbness ,sob, headaches, blurred vision   Numbness ,sob,  headaches, blurred vision Other reaction(s): Other (See Comments), Unknown Numbness ,sob, headaches, blurred vision Numbness ,sob, headaches, blurred vision Other reaction(s): Other (See Comments), Unknown Numbness ,sob, headaches, blurred vision Numbness ,sob, headaches, blurred vision  Dilaudid [Hydromorphone] Rash  Erythromycin Hives  Other Rash  rash  Ace Inhibitors Rash  Lamotrigine Unknown, Rash and Other (See Comments)  Other reaction(s): Unknown  Strawberry Extract Rash  Sulfa (Sulfonamide Antibiotics) Hives, Rash and Unknown  Other reaction(s): Unknown  Other reaction(s): Unknown  Other reaction(s): Unknown Other reaction(s): Unknown Other reaction(s): Unknown    Visit Vitals: Vitals:  05/13/24 0853  BP: 138/82    Review of Systems:  A comprehensive 14 point ROS was performed, reviewed, and the pertinent orthopaedic findings are documented in the HPI.  Physical Exam: General:  Well developed, well nourished, no apparent distress, normal affect, normal gait with no antalgic component.   HEENT: Head normocephalic, atraumatic, PERRL.   Abdomen: Soft, non tender, non distended, Bowel sounds present.  Heart: Examination of the heart reveals regular, rate, and rhythm. There is no murmur noted on ascultation. There is a normal apical pulse.  Lungs: Lungs are clear to auscultation. There is no wheeze, rhonchi, or crackles. There is normal expansion of bilateral chest walls.   Comprehensive Knee Exam: Gait Antalgic on the left  Alignment Neutral   Inspection Left  Skin Normal appearance with no obvious deformity. No ecchymosis or erythema.  Soft Tissue No focal soft tissue swelling  Quad Atrophy None   Palpation  Left  Tenderness Medial joint line and medial parapatellar tenderness to palpation  Crepitus No patellofemoral or tibiofemoral crepitus  Effusion Small effusion   Range of Motion Left  Flexion 0-95  Extension Full knee extension without  hyperextension   Ligamentous Exam Left  Lachman Normal  Valgus 0 Normal  Valgus 30 Normal  Varus 0 Normal  Varus 30 Normal  Anterior Drawer Normal  Posterior Drawer Normal   Meniscal Exam Left  Hyperflexion Test Positive  Hyperextension Test Positive  McMurray's Positive   Neurovascular Left  Quadriceps Strength 5/5  Hamstring Strength 5/5  Hip Abductor Strength 4/5  Distal Motor Normal  Distal Sensory Normal light touch sensation  Distal Pulses Normal    Imaging Studies: Left knee x-rays reviewed by me today show medial and patellofemoral joint space narrowing with proximal tibial sclerosis and early osteophyte formation. Close to bone-on-bone medial articulation in flexion. Kellgren-Lawrence grade 2. AP, sunrise and flexed PA of the right knee show status post cemented total knee arthroplasty with components in unchanged position from prior films no evidence of periprosthetic fracture or loosening.  Knee MRI (Left): 03/06/24: FINDINGS: Mild medial femorotibial joint space narrowing with mild smooth chondromalacia of the central weight-bearing portion of the compartment. Early condylar conformity changes and small adjacent osteophyte. A thin tear of the posterior medial meniscal root. Please refer to coronal PD fat-sat series 7, image 18. Key image submitted. Body of the meniscus mildly pseudo extruded. Medial collateral ligament intact and surrounded by non-complex edema throughout its  entirety. Lateral meniscus and lateral collateral complex intact as is the iliotibial band. No focal cartilaginous defect. ACL and PCL intact. Mild patellofemoral joint space narrowing. Mild smooth superficial chondromalacia of the lateral patellar facet cartilage and adjacent trochlea. Medial and lateral patellar retinacula unremarkable as are the quadriceps and patellar tendon. Mild non complex suprapatellar effusion. Hoffa's fat pad is unremarkable. A partially dehisced approximate 6 x 3 x 2.5 cm  semimembranosus gastrocnemius bursal cyst. Vascular structures intact.  CONCLUSION:  1. Mild medial femorotibial arthropathy and chondromalacia with a thin tear of the posterior medial meniscal root. Mild adjacent medial capsulitis.  2. Mild patellofemoral arthropathy and chondromalacia as described above.  3. Mild 1/4 non-complex suprapatellar effusion.  4. Partially dehisced semimembranosus gastrocnemius bursal cyst with dimensions as described above   Assessment:  Left knee medial meniscus tear, left knee osteoarthritis, left knee Baker's cyst  Plan: Crouse is a 53 year old female presents with left knee osteoarthritis with a complex medial meniscal root tear and tricompartmental degenerative changes. She has had multiple forms of conservative treatment and is no longer interested in conservative treatment as she has had no relief. Her pain has significantly interfered with her quality of life and activities day living. She would like to pursue a more definitive treatment for her left knee. Risks, benefits, complications of a left total knee arthroplasty have been discussed with the patient. Patient has agreed and consented procedure with Dr. Lorelle on 05/28/2024.  The hospitalization and post-operative care and rehabilitation were also discussed. The use of perioperative antibiotics and DVT prophylaxis were discussed. The risk, benefits and alternatives to a surgical intervention were discussed at length with the patient. The patient was also advised of risks related to the medical comorbidities and elevated body mass index (BMI). A lengthy discussion took place to review the most common complications including but not limited to: stiffness, loss of function, complex regional pain syndrome, deep vein thrombosis, pulmonary embolus, heart attack, stroke, infection, wound breakdown, numbness, intraoperative fracture, damage to nerves, tendon,muscles, arteries or other blood vessels, death and other  possible complications from anesthesia. The patient was told that we will take steps to minimize these risks by using sterile technique, antibiotics and DVT prophylaxis when appropriate and follow the patient postoperatively in the office setting to monitor progress. The possibility of recurrent pain, no improvement in pain and actual worsening of pain were also discussed with the patient.   The patient received her IVC filter on 11/4 without complications. Reviewed history patient agrees with plan for left total knee replacement today.

## 2024-05-28 NOTE — Progress Notes (Signed)
 Patient is not able to walk the distance required to go the bathroom, or she is unable to safely negotiate stairs required to access the bathroom.  A 3in1 BSC will alleviate this problem.       Lollie Marrow, PA-C [facility]

## 2024-05-28 NOTE — Anesthesia Postprocedure Evaluation (Signed)
 Anesthesia Post Note  Patient: Alexis Christensen  Procedure(s) Performed: ARTHROPLASTY, KNEE, TOTAL (Left: Knee)  Patient location during evaluation: PACU Anesthesia Type: General Level of consciousness: awake and alert Pain management: pain level controlled Vital Signs Assessment: post-procedure vital signs reviewed and stable Respiratory status: spontaneous breathing, nonlabored ventilation and respiratory function stable Cardiovascular status: blood pressure returned to baseline and stable Postop Assessment: no apparent nausea or vomiting Anesthetic complications: no   No notable events documented.   Last Vitals:  Vitals:   05/28/24 1540 05/28/24 1609  BP: (!) 158/93 129/82  Pulse: 62 83  Resp: 14 15  Temp: 36.7 C 36.8 C  SpO2:  93%    Last Pain:  Vitals:   05/28/24 1609  TempSrc: Oral  PainSc: 5                  Camellia Merilee Louder

## 2024-05-28 NOTE — Plan of Care (Signed)
  Problem: Elimination: Goal: Will not experience complications related to urinary retention Outcome: Progressing   Problem: Pain Managment: Goal: General experience of comfort will improve and/or be controlled Outcome: Progressing   Problem: Safety: Goal: Ability to remain free from injury will improve Outcome: Progressing

## 2024-05-28 NOTE — Op Note (Signed)
 Patient Name: Alexis Christensen  FMW:[id number]  Pre-Operative Diagnosis: Left knee Osteoarthritis  Post-Operative Diagnosis: (same)  Procedure: Left Total Knee Arthroplasty  Components/Implants: Femur: Persona Size 6 CR PPS   Tibia: Persona Size C OsseoTi  Poly: 10mm MC  Patella: 29x46mm symmetric OsseoTi  Femoral Valgus Cut Angle: 5 degrees  Distal Femoral Re-cut: none  Patella Resurfacing: yes   Date of Surgery: 05/28/2024  Surgeon: Arthea Sheer MD  Assistant: Debby Amber PA (present and scrubbed throughout the case, critical for assistance with exposure, retraction, instrumentation, and closure)   Anesthesiologist: Vicci  Anesthesia: General  Tourniquet Time: 46 min  EBL: 100cc  IVF: 500cc  Complications: None   Brief history: The patient is a 53 year old female with a history of osteoarthritis of the left knee with pain limiting their range of motion and activities of daily living, which has failed multiple attempts at conservative therapy.  The risks and benefits of total knee arthroplasty as definitive surgical treatment were discussed with the patient, who opted to proceed with the operation.  After outpatient medical clearance and optimization was completed the patient was admitted to [facility] for the procedure.  All preoperative films were reviewed and an appropriate surgical plan was made prior to surgery. Preoperative range of motion was 0 to 95.   Description of procedure: The patient was brought to the operating room where laterality was confirmed by all those present to be the left side.   Spinal anesthesia was administered and the patient received an intravenous dose of antibiotics for surgical prophylaxis and a dose of tranexamic acid.  Patient is positioned supine on the operating room table with all bony prominences well-padded.  A well-padded tourniquet was applied to the left thigh.  The knee was then prepped and draped in usual  sterile fashion with multiple layers of adhesive and nonadhesive drapes.  All of those present in the operating room participated in a surgical timeout laterality and patient were confirmed.   An Esmarch was wrapped around the extremity and the leg was elevated and the knee flexed.  The tourniquet was inflated to a pressure of 275 mmHg. The Esmarch was removed and the leg was brought down to full extension.  The patella and tibial tubercle identified and outlined using a marking pen and a midline skin incision was made with a knife carried through the subcutaneous tissue down to the extensor retinaculum.  After exposure of the extensor mechanism the medial parapatellar arthrotomy was performed with a scalpel and electrocautery extending down medial and distal to the tibial tubercle taking care to avoid incising the patellar tendon. The knee was examined at this time and there was full thickness loss of cartilage on the entire ventral surface of the patella and the entire medial femoral condyle. A mid body meniscus tear was also noted on the medial side with bipolar lesions in the trochlea and the medial tibial plateau. The decision was made to continue with total knee arthroplasty.   A standard medial release was performed over the proximal tibia.  The knee was brought into extension in order to excise the fat pad taking care not to damage the patella tendon.  The superior soft tissue was removed from the anterior surface of the distal femur to visualize for the procedure.  The knee was then brought into flexion with the patella subluxed laterally and subluxing the tibia anteriorly.  The ACL was transected and removed with electrocautery and additional soft tissue was removed from the proximal  surface of the tibia to fully expose. The PCL was found to be intact and was preserved.  An extramedullary tibial cutting guide was then applied to the leg with a spring-loaded ankle clamp placed around the distal tibia  just above the malleoli the angulation of the guide was adjusted to give some posterior slope in the tibial resection with an appropriate varus/valgus alignment.  The resection guide was then pinned to the proximal tibia and the proximal tibial surface was resected with an oscillating saw.  Careful attention was paid to ensure the blade did not disrupt any of the soft tissues including any lateral or medial ligament.  Attention was then turned to the femur, with the knee slightly flexed a opening drill was used to enter the medullary canal of the femur.  After removing the drill marrow was suctioned out to decompress the distal femur.  An intramedullary femoral guide was then inserted into the drill hole and the alignment guide was seated firmly against the distal end of the medial femoral condyle.  The distal femoral cutting guide was then attached and pinned securely to the anterior surface of the femur and the intramedullary rod and alignment guide was removed.  Distal femur resection was then performed with an oscillating saw with retractors protecting medial and laterally.   The distal cutting block was then removed and the extension gap was checked with a spacer.  Extension gap was found to be appropriately sized to accommodate the spacer block.   The femoral sizing guide was then placed securely into the posterior condyles of the femur and the femoral size was measured and determined to be 6.  The size 6; 4-in-1 cutting guide was placed in position and secured with 2 pins.  The anterior posterior and chamfer resections were then performed with an oscillating saw.  Bony fragments and osteophytes were then removed.  Using a lamina spreader the posterior medial and lateral condyles were checked for additional osteophytes and posterior soft tissue remnants.  Any remaining meniscus was removed at this time.  Periarticular injection was performed in the meniscal rims and posterior capsule with aspiration  performed to ensure no intravascular injection.   The tibia was then exposed and the tibial trial was pinned onto the plateau after confirming appropriate orientation and rotation.  Using the drill bushing the tibia was prepared to the appropriate drill depth.  Tibial broach impactor was then driven through the punch guide using a mallet.  The femoral trial component was then inserted onto the femur. A trial tibial polyethylene bearing was then placed and the knee was reduced.  The knee achieved full extension with no hyperextension and was found to be balanced in flexion and extension with the trials in place.  The knee was then brought into full extension the patella was everted and held with 2 Kocher clamps.  The articular surface of the patella was then resected with an patella reamer and saw after careful measurement with a caliper.  The patella was then prepared with the drill guide and a trial patella was placed.  The knee was then taken through range of motion and it was found that the patella articulated appropriately with the trochlea and good patellofemoral motion without subluxation.    The correct final components for implantation were confirmed and opened by the circulator nurse.  The knee was irrigated with normal saline via pulsatile lavage to remove any bony debris or soft tissue.  The prepared surface of the tibia was exposed  and the tibial component was implanted with good bony contact.  The femoral component was then placed and impacted showing good coverage and a good snug fit.  The patella was then cleared off and the patella compression tool was used to apply the patellar component with symmetric compression onto the patella.  The tibial component was then irrigated and cleared of any debris and a real polyethylene component was placed and engaged with the locking mechanism.  The knee was then injected with the particular cocktail.  The knee was taken through range of motion and found to  be stable in flexion and extension with patellar tracking.  The knee was then irrigated with copious amount of normal saline via pulsatile lavage to remove all loose bodies and other debris.  The knee was then irrigated with surgiphor betadine based wash and reirrigated with saline.  The tourniquet was then dropped and all bleeding vessels were identified and coagulated.  The arthrotomy was approximated with #1 Vicryl and closed with #1 Stratafix suture.  The knee was brought into slight flexion and the subcutaneous tissues were closed with 0 Vicryl, 2-0 Vicryl and a running subcuticular 4-0 stratafix barbed suture.  Skin was then glued with Dermabond.  A sterile adhesive dressing was then placed along with a sequential compression device to the calf, a Ted stocking, and a cryotherapy cuff.   Sponge, needle, and Lap counts were all correct at the end of the case.   The patient was transferred off of the operating room table to a hospital bed, good pulses were found distally on the operative side.  The patient was transferred to the recovery room in stable condition.

## 2024-05-28 NOTE — Evaluation (Signed)
 Physical Therapy Evaluation Patient Details Name: Alexis Christensen MRN: [id number] DOB: 08-02-1970 Today's Date: 05/28/2024  History of Present Illness  Pt is a 53 yo F diagnosed with Left knee Osteoarthritis and is s/p elective L TKA.  Extensive PMH includes: bipolar, anxiety, depression, hypothyroidism, DVT, CHF, ADHD, R wrist Sx x 5, L ankle Sx x 2, HTN, and OA.  Clinical Impression  Pt was pleasant and motivated to participate during the session and put forth good effort throughout. Pt required only minor physical assist with bed mobility tasks but only required cuing for proper sequencing with transfers and gait.  Pt's steps were effortful but she was steady with no overt LOB or buckling during the session and did well overall for POD#0.  Pt reported no adverse symptoms during the session other than expected level of L knee pain with SpO2 and HR WNL throughout on room air.  Pt will benefit from continued PT services upon discharge to safely address deficits listed in patient problem list for decreased caregiver assistance and eventual return to PLOF.           If plan is discharge home, recommend the following: A little help with walking and/or transfers;A little help with bathing/dressing/bathroom;Assistance with cooking/housework;Assist for transportation;Help with stairs or ramp for entrance   Can travel by private vehicle        Equipment Recommendations Rolling walker (2 wheels);BSC/3in1  Recommendations for Other Services       Functional Status Assessment Patient has had a recent decline in their functional status and demonstrates the ability to make significant improvements in function in a reasonable and predictable amount of time.     Precautions / Restrictions Precautions Precautions: Fall Restrictions Weight Bearing Restrictions Per Provider Order: Yes LLE Weight Bearing Per Provider Order: Weight bearing as tolerated      Mobility  Bed Mobility Overal bed  mobility: Needs Assistance Bed Mobility: Supine to Sit     Supine to sit: Min assist     General bed mobility comments: Min A for LLE management    Transfers Overall transfer level: Needs assistance Equipment used: Rolling walker (2 wheels) Transfers: Sit to/from Stand Sit to Stand: Contact guard assist           General transfer comment: Mod multi-modal cues for proper sequencing    Ambulation/Gait Ambulation/Gait assistance: Contact guard assist Gait Distance (Feet): 5 Feet Assistive device: Rolling walker (2 wheels) Gait Pattern/deviations: Step-to pattern, Antalgic, Decreased step length - right, Decreased stance time - left Gait velocity: decreased     General Gait Details: Pt able to take several effortful steps near the EOB and from bed to chair with heavy lean on the RW but with no overt LOB or knee buckling  Stairs            Wheelchair Mobility     Tilt Bed    Modified Rankin (Stroke Patients Only)       Balance Overall balance assessment: Needs assistance   Sitting balance-Leahy Scale: Normal     Standing balance support: Bilateral upper extremity supported, During functional activity Standing balance-Leahy Scale: Good                               Pertinent Vitals/Pain Pain Assessment Pain Assessment: 0-10 Pain Score: 4  Pain Location: L knee Pain Descriptors / Indicators: Sore, Heaviness Pain Intervention(s): Repositioned, Ice applied, Premedicated before session, Monitored during session  Home Living Family/patient expects to be discharged to:: Private residence Living Arrangements: Children Available Help at Discharge: Family;Available 24 hours/day Type of Home: Mobile home Home Access: Stairs to enter Entrance Stairs-Rails: Right;Left;Can reach both Entrance Stairs-Number of Steps: 5   Home Layout: One level Home Equipment: Rollator (4 wheels)      Prior Function Prior Level of Function :  Independent/Modified Independent;History of Falls (last six months)             Mobility Comments: Ind amb without AD community distances, 1 fall, works PT in psychiatric nurse station ADLs Comments: Ind with ADLs     Extremity/Trunk Assessment   Upper Extremity Assessment Upper Extremity Assessment: Overall WFL for tasks assessed    Lower Extremity Assessment Lower Extremity Assessment: LLE deficits/detail LLE Deficits / Details: BLE ankle AROM, strength, and sensation to light touch all WNL LLE: Unable to fully assess due to pain       Communication   Communication Communication: No apparent difficulties    Cognition Arousal: Alert Behavior During Therapy: WFL for tasks assessed/performed   PT - Cognitive impairments: No apparent impairments                         Following commands: Intact       Cueing Cueing Techniques: Verbal cues, Visual cues, Tactile cues     General Comments      Exercises Total Joint Exercises Ankle Circles/Pumps: AROM, Both, 10 reps Quad Sets: AROM, Left, 10 reps, Strengthening Straight Leg Raises: AAROM, Left, 5 reps Long Arc Quad: AROM, Left, Strengthening, 10 reps Knee Flexion: AROM, Strengthening, Left, 10 reps Goniometric ROM: L knee AROM: 2-75 Marching in Standing: AROM, Strengthening, Both, 5 reps Other Exercises Other Exercises: Positioning education to promote L knee ext PROM Other Exercises: General HEP review for LLE QS and seated knee flex   Assessment/Plan    PT Assessment Patient needs continued PT services  PT Problem List Decreased strength;Decreased range of motion;Decreased activity tolerance;Decreased balance;Decreased mobility;Decreased knowledge of use of DME;Pain       PT Treatment Interventions DME instruction;Gait training;Stair training;Functional mobility training;Therapeutic activities;Therapeutic exercise;Balance training;Patient/family education    PT Goals (Current goals can be found in the Care  Plan section)  Acute Rehab PT Goals Patient Stated Goal: To get back to working part time and playing with her grandchildren PT Goal Formulation: With patient Time For Goal Achievement: 06/10/24 Potential to Achieve Goals: Good    Frequency BID     Co-evaluation               AM-PAC PT 6 Clicks Mobility  Outcome Measure Help needed turning from your back to your side while in a flat bed without using bedrails?: A Little Help needed moving from lying on your back to sitting on the side of a flat bed without using bedrails?: A Little Help needed moving to and from a bed to a chair (including a wheelchair)?: A Little Help needed standing up from a chair using your arms (e.g., wheelchair or bedside chair)?: A Little Help needed to walk in hospital room?: A Little Help needed climbing 3-5 steps with a railing? : A Lot 6 Click Score: 17    End of Session Equipment Utilized During Treatment: Gait belt Activity Tolerance: Patient tolerated treatment well Patient left: in chair;with call bell/phone within reach;Other (comment) (polar care donned to L knee) Nurse Communication: Mobility status;Weight bearing status PT Visit Diagnosis: History of falling (Z91.81);Other abnormalities  of gait and mobility (R26.89);Muscle weakness (generalized) (M62.81);Pain Pain - Right/Left: Left Pain - part of body: Knee    Time: 1624-1700 PT Time Calculation (min) (ACUTE ONLY): 36 min   Charges:   PT Evaluation $PT Eval Moderate Complexity: 1 Mod PT Treatments $Therapeutic Exercise: 8-22 mins PT General Charges $$ ACUTE PT VISIT: 1 Visit       D. Glendia Bertin PT, DPT 05/28/24, 5:26 PM

## 2024-05-29 ENCOUNTER — Encounter: Payer: Self-pay | Admitting: Oncology

## 2024-05-29 ENCOUNTER — Telehealth (HOSPITAL_COMMUNITY): Payer: Self-pay | Admitting: Pharmacy Technician

## 2024-05-29 ENCOUNTER — Other Ambulatory Visit: Payer: Self-pay

## 2024-05-29 ENCOUNTER — Other Ambulatory Visit (HOSPITAL_COMMUNITY): Payer: Self-pay

## 2024-05-29 ENCOUNTER — Encounter: Payer: Self-pay | Admitting: Orthopedic Surgery

## 2024-05-29 DIAGNOSIS — M170 Bilateral primary osteoarthritis of knee: Secondary | ICD-10-CM | POA: Diagnosis not present

## 2024-05-29 LAB — BASIC METABOLIC PANEL WITH GFR
Anion gap: 7 (ref 5–15)
BUN: 10 mg/dL (ref 6–20)
CO2: 28 mmol/L (ref 22–32)
Calcium: 8.4 mg/dL — ABNORMAL LOW (ref 8.9–10.3)
Chloride: 102 mmol/L (ref 98–111)
Creatinine, Ser: 0.57 mg/dL (ref 0.44–1.00)
GFR, Estimated: >60 mL/min (ref >60)
Glucose, Bld: 139 mg/dL — ABNORMAL HIGH (ref 70–99)
Potassium: 4.1 mmol/L (ref 3.5–5.1)
Sodium: 137 mmol/L (ref 135–145)

## 2024-05-29 LAB — CBC
HCT: 33.2 % — ABNORMAL LOW (ref 36.0–46.0)
Hemoglobin: 10.5 g/dL — ABNORMAL LOW (ref 12.0–15.0)
MCH: 28.4 pg (ref 26.0–34.0)
MCHC: 31.6 g/dL (ref 30.0–36.0)
MCV: 89.7 fL (ref 80.0–100.0)
Platelets: 290 K/uL (ref 150–400)
RBC: 3.7 MIL/uL — ABNORMAL LOW (ref 3.87–5.11)
RDW: 13.5 % (ref 11.5–15.5)
WBC: 12.5 K/uL — ABNORMAL HIGH (ref 4.0–10.5)
nRBC: 0 % (ref 0.0–0.2)

## 2024-05-29 MED ORDER — KETOROLAC TROMETHAMINE 15 MG/ML IJ SOLN
INTRAMUSCULAR | Status: AC
  Filled 2024-05-29: qty 1

## 2024-05-29 MED ORDER — LISINOPRIL 5 MG PO TABS
ORAL_TABLET | ORAL | Status: AC
  Filled 2024-05-29: qty 2

## 2024-05-29 MED ORDER — GABAPENTIN 300 MG PO CAPS
ORAL_CAPSULE | ORAL | Status: AC
  Filled 2024-05-29: qty 2

## 2024-05-29 MED ORDER — APIXABAN 2.5 MG PO TABS
ORAL_TABLET | ORAL | Status: AC
  Filled 2024-05-29: qty 1

## 2024-05-29 MED ORDER — ASPIRIN 81 MG PO CHEW
CHEWABLE_TABLET | ORAL | Status: AC
  Filled 2024-05-29: qty 1

## 2024-05-29 NOTE — Plan of Care (Signed)
 Documented

## 2024-05-29 NOTE — Progress Notes (Signed)
 Subjective: 1 Day Post-Op Procedure(s) (LRB): ARTHROPLASTY, KNEE, TOTAL (Left) Patient reports pain as mild.   Patient is well, and has had no acute complaints or problems Denies any CP, SOB, ABD pain. We will continue therapy today.  Plan is to go Home after hospital stay.  Objective: Vital signs in last 24 hours: Temp:  [96.8 F (36 C)-98.7 F (37.1 C)] 98.1 F (36.7 C) (11/14 0756) Pulse Rate:  [41-99] 66 (11/14 0756) Resp:  [13-18] 18 (11/14 0756) BP: (101-159)/(62-104) 108/62 (11/14 0756) SpO2:  [90 %-100 %] 90 % (11/14 0756) Weight:  [888 kg] 111 kg (11/13 1540)  Intake/Output from previous day: 11/13 0701 - 11/14 0700 In: 1521.1 [P.O.:325; I.V.:740; IV Piggyback:456.1] Out: 100 [Blood:100] Intake/Output this shift: No intake/output data recorded.  Recent Labs    05/29/24 0630  HGB 10.5*   Recent Labs    05/29/24 0630  WBC 12.5*  RBC 3.70*  HCT 33.2*  PLT 290   Recent Labs    05/29/24 0630  NA 137  K 4.1  CL 102  CO2 28  BUN 10  CREATININE 0.57  GLUCOSE 139*  CALCIUM 8.4*   No results for input(s): LABPT, INR in the last 72 hours.  EXAM General - Patient is Alert, Appropriate, and Oriented Extremity - Neurovascular intact Sensation intact distally Intact pulses distally Dorsiflexion/Plantar flexion intact Dressing - dressing C/D/I and no drainage Motor Function - intact, moving foot and toes well on exam.   Past Medical History:  Diagnosis Date   (HFpEF) heart failure with preserved ejection fraction (HCC)    Acute deep vein thrombosis (DVT) of right lower extremity (HCC) 2016   ADHD    Adopted    ANA positive    Anemia    Anxiety    Arthritis    Asthma    B12 deficiency    Bilateral swelling of feet and ankles    Bipolar 1 disorder (HCC)    Carpal tunnel syndrome of left wrist    Chest pain    Chronic diarrhea 09/07/2015   Chronic left shoulder pain    Clostridioides difficile infection 04/06/2019   Depression     Diverticulosis    Edema leg    GERD (gastroesophageal reflux disease)    Heart murmur    Helicobacter pylori (H. pylori) infection 08/24/2015   Hiatal hernia    History of 2019 novel coronavirus disease (COVID-19) 09/09/2020   a.) PCR testing (+): 09/09/2020, 01/12/2021, 07/17/2021   HLD (hyperlipidemia)    HTN (hypertension)    Hx MRSA infection    Hypothyroidism    Influenza A 10/01/2022   Lipoma of arm 05/11/2013   Lower extremity edema    Lumbago    Lymphedema 04/26/2016   Medial meniscus tear (LEFT)    Migraine with aura    Multinodular goiter    Nephrolithiasis    Opioid dependence (HCC)    a.) on buprenorphine   OSA on CPAP    Plantar fascial fibromatosis    Pneumonia due to COVID-19 virus    Post traumatic stress disorder (PTSD)    Pre-diabetes    PVD (peripheral vascular disease)    Seasonal allergies    Shortness of breath on exertion    Stress incontinence    Swelling of limb 04/26/2016   Urge incontinence    Venous insufficiency    Venous ulcer (HCC)    Vitamin D  deficiency     Assessment/Plan:   1 Day Post-Op Procedure(s) (LRB): ARTHROPLASTY, KNEE,  TOTAL (Left) Principal Problem:   S/P TKR (total knee replacement), left  Estimated body mass index is 42 kg/m as calculated from the following:   Height as of this encounter: 5' 4 (1.626 m).   Weight as of this encounter: 111 kg. Advance diet Up with therapy Pain well controlled Labs and VSS CM to assist with discharge to home with HHPT  DVT Prophylaxis - TED hose and SCDS, Eliquis Weight-Bearing as tolerated to left leg   T. Medford Amber, PA-C [facility] 05/29/2024, 8:01 AM

## 2024-05-29 NOTE — Progress Notes (Signed)
 OT Cancellation Note  Patient Details Name: MANUELA HALBUR MRN: [id number] DOB: [birth year]   Cancelled Treatment:    Reason Eval/Treat Not Completed: Other (comment) PT just finishing eval.  PT spoke with pt post PT eval and pt declines need for OT.  Inocente Blazing, MS, OTR/L  Inocente MARLA Blazing 05/29/2024, 9:43 AM

## 2024-05-29 NOTE — TOC Transition Note (Signed)
 Transition of Care [facility]) - Discharge Note   Patient Details  Name: Alexis Christensen MRN: [id number] Date of Birth: [birth year]  [facility] CM/SW Contact:  Alvaro Louder, LCSW Phone Number: 05/29/2024, 12:40 PM   Clinical Narrative:   ISRAEL discussed PT recommendation of HH Patient was agreeable. LCSWA reached out to [facility] admissions coordinator an started service for patient. The patient reported that she would have family come pick him up at discharge. LCSWA discussed PT recommendation  of RW and BSC 3in1. Patient was agreeable, LCSWA reached out to [employer] coordinator to set up equipment delivery.  TOC signing off        Patient Goals and CMS Choice            Discharge Placement                       Discharge Plan and Services Additional resources added to the After Visit Summary for                                       Social Drivers of Health (SDOH) Interventions SDOH Screenings   Food Insecurity: No Food Insecurity (05/28/2024)  Housing: Low Risk  (05/28/2024)  Transportation Needs: No Transportation Needs (05/28/2024)  Utilities: Not At Risk (05/28/2024)  Depression (PHQ2-9): High Risk (03/28/2021)  Financial Resource Strain: Low Risk  (05/13/2024)   Received from [facility]  Tobacco Use: Medium Risk (05/28/2024)     Readmission Risk Interventions     No data to display

## 2024-05-29 NOTE — Progress Notes (Signed)
 Physical Therapy Treatment Patient Details Name: Alexis Christensen MRN: [id number] DOB: 06-May-1971 Today's Date: 05/29/2024   History of Present Illness Pt is a 53 yo F diagnosed with Left knee Osteoarthritis and is s/p elective L TKA.  Extensive PMH includes: bipolar, anxiety, depression, hypothyroidism, DVT, CHF, ADHD, R wrist Sx x 5, L ankle Sx x 2, HTN, and OA.    PT Comments  Pt was found in chair, pleasant and motivated to participate during the session and put forth good effort throughout. Pt was performed exercises in chair, ambulation, stair negotiation and was educated on car transfers. Pt tolerated treatment well, with pain meds given prior to session, pain remained 2/10 and SpO2 in the high 80s to low 90s (nursing notified) throughout on RA. Pt did require some rest breaks x2 during ambulation, needed cueing for proper sequencing, and was able to accept weight through L LE, without any overt LOB. Pt will benefit from continued PT services upon discharge to safely address deficits listed in patient problem list for decreased caregiver assistance and eventual return to PLOF.    If plan is discharge home, recommend the following: A little help with walking and/or transfers;A little help with bathing/dressing/bathroom;Assistance with cooking/housework;Assist for transportation;Help with stairs or ramp for entrance   Can travel by private vehicle        Equipment Recommendations  Rolling walker (2 wheels);BSC/3in1    Recommendations for Other Services       Precautions / Restrictions Precautions Precautions: Fall Recall of Precautions/Restrictions: Intact Restrictions Weight Bearing Restrictions Per Provider Order: Yes LLE Weight Bearing Per Provider Order: Weight bearing as tolerated     Mobility  Bed Mobility Overal bed mobility: Needs Assistance Bed Mobility:  (pt found in chair)     Supine to sit:  (pt found in chair)          Transfers Overall transfer level: Needs  assistance Equipment used: Rolling walker (2 wheels) Transfers: Sit to/from Stand Sit to Stand: Contact guard assist           General transfer comment: cues for proper sequencing    Ambulation/Gait Ambulation/Gait assistance: Contact guard assist Gait Distance (Feet): 150 Feet (over 2 bouts with seated rest between) Assistive device: Rolling walker (2 wheels) Gait Pattern/deviations: Step-to pattern, Antalgic, Decreased step length - right, Decreased stance time - left, Decreased weight shift to left, gradual progression to step through towards end of session Gait velocity: decreased   Pre-gait activities: marching General Gait Details: pt required seated rest break using RW due to fatigue, able to tolerate WB on the L LE without any knee buckling or LOB   Stairs Stairs: Yes Stairs assistance: Contact guard assist Stair Management: Two rails Number of Stairs: 4 General stair comments: negogiated with step to pattern and CGA, visual and verbal cues for sequencing   Wheelchair Mobility     Tilt Bed    Modified Rankin (Stroke Patients Only)       Balance Overall balance assessment: Needs assistance Sitting-balance support: No upper extremity supported Sitting balance-Leahy Scale: Normal     Standing balance support: No upper extremity supported Standing balance-Leahy Scale: Good                              Communication Communication Communication: No apparent difficulties  Cognition Arousal: Alert Behavior During Therapy: WFL for tasks assessed/performed   PT - Cognitive impairments: No apparent impairments  Following commands: Intact      Cueing Cueing Techniques: Verbal cues, Visual cues, Tactile cues  Exercises Total Joint Exercises Ankle Circles/Pumps: AROM, 10 reps, Left Quad Sets: AROM, Left, 10 reps, Strengthening Straight Leg Raises: AAROM, Left, 5 reps Long Arc Quad: AROM, Left, Strengthening,  10 reps Knee Flexion: AROM, Strengthening, Left, 10 reps Marching in Standing: AROM, Strengthening, Both, 10 reps Other Exercises Other Exercises: gait, stair and car transfer training L knee AROM 2-76    General Comments  Pt was able to complete tasks needed to safely d/c      Pertinent Vitals/Pain Pain Assessment Pain Assessment: 0-10 Pain Score: 2  Pain Location: L knee Pain Descriptors / Indicators: Sore, Heaviness Pain Intervention(s): Repositioned, Monitored during session, Ice applied    Home Living                          Prior Function            PT Goals (current goals can now be found in the care plan section) Acute Rehab PT Goals Patient Stated Goal: To get back to working part time and playing with her grandchildren PT Goal Formulation: With patient Time For Goal Achievement: 06/10/24 Potential to Achieve Goals: Good Progress towards PT goals: Progressing toward goals    Frequency    BID      PT Plan      Co-evaluation              AM-PAC PT 6 Clicks Mobility   Outcome Measure  Help needed turning from your back to your side while in a flat bed without using bedrails?: A Little Help needed moving from lying on your back to sitting on the side of a flat bed without using bedrails?: A Little Help needed moving to and from a bed to a chair (including a wheelchair)?: A Little Help needed standing up from a chair using your arms (e.g., wheelchair or bedside chair)?: A Little Help needed to walk in hospital room?: A Little Help needed climbing 3-5 steps with a railing? : A Little 6 Click Score: 18    End of Session Equipment Utilized During Treatment: Gait belt Activity Tolerance: Patient tolerated treatment well Patient left: in bed;with bed alarm set;with call bell/phone within reach;Other (comment) (polar care, SCDs donned) Nurse Communication: Mobility status;Weight bearing status PT Visit Diagnosis: History of falling  (Z91.81);Other abnormalities of gait and mobility (R26.89);Muscle weakness (generalized) (M62.81);Pain Pain - Right/Left: Left Pain - part of body: Knee     Time: 0900-0940 PT Time Calculation (min) (ACUTE ONLY): 40 min  Charges:                            Corean Newport, SPT 05/29/24, 11:34 AM

## 2024-05-29 NOTE — Telephone Encounter (Signed)
 Patient Product/process development scientist completed.    The patient is insured through [facility]. Patient has Medicare and is not eligible for a copay card, but may be able to apply for patient assistance or Medicare RX Payment Plan (Patient Must reach out to their plan, if eligible for payment plan), if available.    Ran test claim for Eliquis 2.5 mg and the current 30 day co-pay is $0.00.   This test claim was processed through [facility]- copay amounts may vary at other pharmacies due to pharmacy/plan contracts, or as the patient moves through the different stages of their insurance plan.     Reyes Sharps, CPHT Pharmacy Technician Patient Advocate Specialist Lead [facility] Patient Advocate Team Direct Number: [phone]  Fax: [phone]

## 2024-06-10 ENCOUNTER — Other Ambulatory Visit: Payer: Self-pay | Admitting: Orthopedic Surgery

## 2024-06-10 ENCOUNTER — Ambulatory Visit
Admission: RE | Admit: 2024-06-10 | Discharge: 2024-06-10 | Disposition: A | Discharge status: Home / Self Care | Source: Ambulatory Visit | Attending: Orthopedic Surgery | Admitting: Orthopedic Surgery

## 2024-06-10 DIAGNOSIS — Z86718 Personal history of other venous thrombosis and embolism: Secondary | ICD-10-CM

## 2024-06-10 NOTE — Progress Notes (Signed)
 A new order for US  lower extremity placed for STAT

## 2024-06-16 NOTE — Progress Notes (Signed)
 Chief Complaint: Chief Complaint  Patient presents with  . Post Operative Visit    Left TKA 05/28/24 by Dr. Lorelle Math Alexis Christensen is a 52 y.o. female who presents today status post left total knee arthroplasty 05/28/2024 by Dr. Lorelle.  Patient is doing well.  Ambulating well with a walker.  Pain controlled with tramadol .  She is trying to avoid oxycodone .  She denies any warmth redness or drainage.  No calf pain redness or swelling.  She has had some increase in lower extremity edema but had some at baseline.  Unable to wear compression stocking.  She is keeping the lower foot elevated which is helping.  IVC filter was placed, she is on Eliquis  2.5 mg twice daily.  She starts outpatient physical therapy at [facility]    Past Medical History: Past Medical History:  Diagnosis Date  . Anemia   . Anxiety   . Asthma without status asthmaticus (HHS-HCC)   . Bipolar disorder (CMS/HHS-HCC)   . Depression   . DVT (deep venous thrombosis) (CMS/HHS-HCC)   . History of chickenpox   . History of suicide attempt   . Hyperlipidemia   . Hypothyroidism   . Migraines   . Osteoarthritis     Past Surgical History: Past Surgical History:  Procedure Laterality Date  . CHOLECYSTECTOMY  2004  . KNEE ARTHROSCOPY Right 07/06/2008   Right knee arthroscopy, partial medial meniscectomy, arthroscopic lateral release.  SABRA KNEE ARTHROSCOPY Right 06/07/2009   Right knee arthroscopy, partial medial meniscectomy.  SABRA JOINT REPLACEMENT Right 11/06/2012   Right total knee replacement.  . COLONOSCOPY  09/16/2014   @ [facility] at [facility] - sigmoid diverticulosis, tortuous colon, stool in ascending colon  . UPPER GASTROINTESTINAL ENDOSCOPY  09/16/2014   @ [facility] at [facility] - small hiatal hernia, few fundic gland polyps, normal duodenum  . Knee arthroscopy lateral and partial synovectomy Left 10/18/2015   Dr.Menz  . COLONOSCOPY  06/24/2019   Tubular adenoma of the colon/Repeat 58yrs/TKT  . EGD  06/24/2019    Gastritis/No repeat/TKT  . wrist surgery Left 06/30/2021   Tendon release  . HYSTERECTOMY    . Laparoscopy    . Left ankle surgery x2    . R Shoulder Arthroscopy 02/2013    . 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 Right wrist surgery x5      Past Family History: Family History  Adopted: Yes  Problem Relation Age of Onset  . Alcohol abuse Mother   . Alcohol abuse Father   . Stroke Sister   . Colon cancer Neg Hx   . Colon polyps Neg Hx     Medications: Current Outpatient Medications  Medication Sig Dispense Refill  . albuterol  90 mcg/actuation inhaler Inhale 2 inhalations into the lungs every 6 (six) hours as needed 1 each 1  . amitriptyline (ELAVIL) 75 MG tablet TAKE 1 TABLET BY MOUTH EVERY DAY AT NIGHT 90 tablet 0  . ARIPiprazole  (ABILIFY ) 30 MG tablet Take 30 mg by mouth once daily    . aspirin  81 MG chewable tablet Aspirin  81 MG Oral Tablet Chewable QTY: 30 tablet Days: 30 Refills: 0  Written: 01/23/21 Patient Instructions: once a day    . buprenorphine-naloxone (SUBOXONE) 8-2 mg SL film Place under the tongue 3 (three) times daily (Patient not taking: Reported on 05/13/2024)    . celecoxib  (CELEBREX ) 100 MG capsule TAKE 1 CAPSULE(100 MG) BY MOUTH TWICE DAILY (Patient not taking: Reported on 05/13/2024) 60  capsule 1  . celecoxib  (CELEBREX ) 200 MG capsule Take 1 capsule (200 mg total) by mouth 2 (two) times daily 30 capsule 0  . ciprofloxacin  HCl (CIPRO ) 250 MG tablet Take 250 mg by mouth once daily (Patient not taking: Reported on 05/13/2024)    . colestipoL  (COLESTID ) 1 gram tablet TAKE 2 TABLETS (2 G TOTAL) BY MOUTH 2 (TWO) TIMES DAILY TAKE AT LUNCH TIME. 360 tablet 0  . cyclobenzaprine  (FLEXERIL ) 5 MG tablet Take 5 mg by mouth 3 (three) times daily as needed for Muscle spasms (Patient not taking: Reported on 05/13/2024)    . dicyclomine (BENTYL) 10 mg capsule     . DULoxetine  (CYMBALTA ) 60 MG DR capsule Take 60 mg by mouth 2 (two) times daily      .  famotidine  (PEPCID ) 20 MG tablet Take 20 mg by mouth 2 (two) times daily    . fluticasone  propionate (FLONASE ) 50 mcg/actuation nasal spray Place 1 spray into both nostrils once daily (Patient not taking: Reported on 05/13/2024)    . FUROsemide (LASIX) 40 MG tablet Take by mouth once daily (Patient not taking: Reported on 05/13/2024)    . gabapentin  600 mg Tb24 600 mg 2 (two) times daily One tablet by mouth twice a day and two at bedtime    . hydrOXYzine  (VISTARIL ) 50 MG capsule     . levothyroxine  (SYNTHROID ) 112 MCG tablet Take 2 tablets (224 mcg total) by mouth once daily Take on an empty stomach with a glass of water at least 30-60 minutes before breakfast. 180 tablet 3  . liothyronine  (CYTOMEL ) 5 MCG tablet Take 2 tablets (10 mcg total) by mouth once daily Take on an empty stomach with a glass of water at least 30-60 minutes before breakfast. 180 tablet 4  . lisinopriL  (ZESTRIL ) 5 MG tablet Take 5 mg by mouth once daily    . metoprolol  tartrate (LOPRESSOR ) 50 MG tablet Take 50 mg by mouth once daily    . mupirocin  (BACTROBAN ) 2 % ointment Apply 2 g topically 2 (two) times daily APPLY TO AFFECTED AREA    . pantoprazole  (PROTONIX ) 40 MG DR tablet Take by mouth 2 (two) times daily before meals    . tirzepatide  (ZEPBOUND ) 2.5 mg/0.5 mL pen injector Inject 2.5 mg subcutaneously every 7 (seven) days     No current facility-administered medications for this visit.    Allergies: Allergies  Allergen Reactions  . Sulfacetamide Sodium Hives  . Ziprasidone Anaphylaxis and Other (See Comments)    Numbness ,sob, headaches, blurred vision   Other reaction(s): Other (See Comments), Unknown Numbness ,sob, headaches, blurred vision Numbness ,sob, headaches, blurred vision   Other reaction(s): Other (See Comments), Unknown Numbness ,sob, headaches, blurred vision Numbness ,sob, headaches, blurred vision    Numbness ,sob, headaches, blurred vision Other reaction(s): Other (See Comments),  Unknown Numbness ,sob, headaches, blurred vision Numbness ,sob, headaches, blurred vision Other reaction(s): Other (See Comments), Unknown Numbness ,sob, headaches, blurred vision Numbness ,sob, headaches, blurred vision   . Dilaudid  [Hydromorphone ] Rash  . Erythromycin Hives  . Other Rash    rash  . Ace Inhibitors Rash  . Lamotrigine Unknown, Rash and Other (See Comments)    Other reaction(s): Unknown  . Strawberry Extract Rash  . Sulfa (Sulfonamide Antibiotics) Hives, Rash and Unknown    Other reaction(s): Unknown  Other reaction(s): Unknown    Other reaction(s): Unknown Other reaction(s): Unknown Other reaction(s): Unknown     Review of Systems:  A comprehensive 14 point ROS was performed,  reviewed by me today, and the pertinent orthopaedic findings are documented in the HPI.   Exam: BP 122/70   Ht 162.6 cm (5' 4)   Wt (!) 111.1 kg (245 lb)   BMI 42.05 kg/m  General/Constitutional: The patient appears to be well-nourished, well-developed, and in no acute distress. Neuro/Psych: Normal mood and affect, oriented to person, place and time. Eyes: Non-icteric.  Pupils are equal, round, and reactive to light, and exhibit synchronous movement. ENT: Unremarkable. Lymphatic: No palpable adenopathy. Respiratory: Non-labored breathing Cardiovascular: No edema, swelling or tenderness, except as noted in detailed exam. Integumentary: No impressive skin lesions present, except as noted in detailed exam. Musculoskeletal: Unremarkable, except as noted in detailed exam.  General: Well developed, well nourished female in no apparent distress.  Normal affect.  Normal communication.  Patient answers questions appropriately.  The patient has a slow gait. They ambulate with walker.  Left Lower Extremity: Examination of the left knee reveals the incision to be intact. There is no erythema, warmth, and no drainage.  There is no purulence.  There is mild effusion.  The range of motion was  5-95 .   There was less than 2 second capillary refill. The sensation was intact to light touch.  The patient has a negative Homan's test. Knee was stable to valgus and varus stress testing.    Impression: Status post total left knee replacement [Z96.652] Status post total left knee replacement  (primary encounter diagnosis)  Plan:  1.  Patient will continue with physical therapy.  2. Call for any increased redness, swelling, warmth, fevers, or for any urgent changes in their health. 3. Continue with TED hose as needed for swelling 4. Continue with polar care unit.  5. Patient was educated on need for dental prophylaxis prior to dental procedures. 6. Follow up in 4 weeks for xrays of the operative knee.   7.  Continue with Eliquis  2.5 mg twice daily  This note was generated in part with voice recognition software and I apologize for any typographical errors that were not detected and corrected.   Debby Lonni Amber MPA-C

## 2024-06-18 ENCOUNTER — Ambulatory Visit (INDEPENDENT_AMBULATORY_CARE_PROVIDER_SITE_OTHER): Admitting: Vascular Surgery

## 2024-06-18 ENCOUNTER — Encounter (INDEPENDENT_AMBULATORY_CARE_PROVIDER_SITE_OTHER): Payer: Self-pay | Admitting: Vascular Surgery

## 2024-06-18 VITALS — BP 120/80 | HR 75 | Resp 18 | Ht 64.0 in | Wt 244.4 lb

## 2024-06-18 DIAGNOSIS — K219 Gastro-esophageal reflux disease without esophagitis: Secondary | ICD-10-CM

## 2024-06-18 DIAGNOSIS — Z86718 Personal history of other venous thrombosis and embolism: Secondary | ICD-10-CM | POA: Insufficient documentation

## 2024-06-18 DIAGNOSIS — E785 Hyperlipidemia, unspecified: Secondary | ICD-10-CM

## 2024-06-18 DIAGNOSIS — Z96652 Presence of left artificial knee joint: Secondary | ICD-10-CM

## 2024-06-18 MED ORDER — FUROSEMIDE 20 MG PO TABS: 40.0000 mg | ORAL_TABLET | Freq: Every day | ORAL | 0 refills | Status: AC | PRN

## 2024-06-18 MED ORDER — POTASSIUM CHLORIDE CRYS ER 20 MEQ PO TBCR: 20.0000 meq | EXTENDED_RELEASE_TABLET | Freq: Every day | ORAL | 0 refills | Status: AC | PRN

## 2024-06-18 NOTE — H&P (View-Only) (Signed)
 "        MRN : [id number]  Alexis Christensen is a 53 y.o. (1970/09/13) female who presents with chief complaint of legs hurt and swell.  History of Present Illness:   The patient presents to the office for follow-up status post IVC filter placement prior to joint replacement surgery.  The patient has a history of DVT and therefore was at high risk for recurrence in the perioperative timeframe after undergoing a total joint replacement.    Left TKR surgery was on 05/28/2024.  The patient has been doing well with rehab and their mobility is increasing.  The patient notes the affected leg is not increasingly swollen or painful.  No signs and symptoms consistent with recurrence of DVT in the lower extremity.  Patient does note that as a baseline the affected extremity tends to swell with prolonged dependence, symptoms are much better with elevation.  The patient notes minimal edema in the morning which steadily worsens throughout the day.  The patient has not been using compression therapy at this point.  The patient is not on longterm anticoagulation.  No SOB or pleuritic chest pains.  No cough or hemoptysis.  No blood per rectum or blood in any sputum.  No excessive bruising per the patient.   No recent shortening of the patient's walking distance or new symptoms consistent with claudication.  No history of rest pain symptoms. No new ulcers or wounds of the lower extremities have occurred.  The patient denies amaurosis fugax or recent TIA symptoms. There are no recent neurological changes noted. No recent episodes of angina or shortness of breath documented.   Current Meds  Medication Sig   acetaminophen  (TYLENOL ) 500 MG tablet Take 2 tablets (1,000 mg total) by mouth every 8 (eight) hours.   albuterol  (VENTOLIN  HFA) 108 (90 Base) MCG/ACT inhaler Inhale 1 puff into the lungs every 6 (six) hours as needed for wheezing or shortness of breath.   apixaban  (ELIQUIS ) 2.5 MG TABS tablet Take 1  tablet (2.5 mg total) by mouth 2 (two) times daily for 28 days.   ARIPiprazole  (ABILIFY ) 30 MG tablet Take 30 mg by mouth daily.   Budeson-Glycopyrrol-Formoterol  (BREZTRI  AEROSPHERE) 160-9-4.8 MCG/ACT AERO Inhale 2 puffs into the lungs in the morning and at bedtime.   colestipol  (COLESTID ) 1 g tablet TAKE 2 TABLETS(2 GRAMS) BY MOUTH TWICE DAILY   dicyclomine (BENTYL) 10 MG capsule TAKE 1 CAPSULE BY MOUTH EVERY 8 HOURS AS NEEDED FOR ABDOMINAL PAIN   docusate sodium  (COLACE) 100 MG capsule Take 1 capsule (100 mg total) by mouth 2 (two) times daily.   DULoxetine  (CYMBALTA ) 60 MG capsule Take 60 mg by mouth 2 (two) times daily.   esomeprazole  (NEXIUM ) 40 MG capsule Take 1 capsule (40 mg total) by mouth 2 (two) times daily before a meal.   gabapentin  (NEURONTIN ) 600 MG tablet Take 600 mg by mouth 2 (two) times daily.    hydrOXYzine  (ATARAX /VISTARIL ) 25 MG tablet Take 50 mg by mouth every 8 (eight) hours as needed for itching.   levothyroxine  (SYNTHROID ) 112 MCG tablet Take 112 mcg by mouth daily before breakfast.   lisinopril  (ZESTRIL ) 10 MG tablet Take 1 tablet (10 mg total) by mouth daily.   metoprolol  tartrate (LOPRESSOR ) 50 MG tablet Take 50 mg by mouth daily in the afternoon.   nortriptyline  (PAMELOR ) 10 MG capsule Take 10 mg in the morning   ondansetron  (ZOFRAN -ODT) 4 MG disintegrating tablet Take 1 tablet (4 mg total) by mouth every 6 (  six) hours as needed for nausea.   oxyCODONE  (ROXICODONE ) 5 MG immediate release tablet Take 1 tablet (5 mg total) by mouth every 6 (six) hours as needed for breakthrough pain.   rosuvastatin  (CRESTOR ) 40 MG tablet Take 40 mg by mouth daily.   spironolactone  (ALDACTONE ) 25 MG tablet Take 1 tablet (25 mg total) by mouth once for 1 dose.   traMADol  (ULTRAM ) 50 MG tablet Take 1 tablet (50 mg total) by mouth every 6 (six) hours as needed for moderate pain (pain score 4-6).   triamcinolone  cream (KENALOG ) 0.1 % Apply 1 Application topically 2 (two) times daily.     Past Medical History:  Diagnosis Date   (HFpEF) heart failure with preserved ejection fraction (HCC)    Acute deep vein thrombosis (DVT) of right lower extremity (HCC) 2016   ADHD    Adopted    ANA positive    Anemia    Anxiety    Arthritis    Asthma    B12 deficiency    Bilateral swelling of feet and ankles    Bipolar 1 disorder (HCC)    Carpal tunnel syndrome of left wrist    Chest pain    Chronic diarrhea 09/07/2015   Chronic left shoulder pain    Clostridioides difficile infection 04/06/2019   Depression    Diverticulosis    Edema leg    GERD (gastroesophageal reflux disease)    Heart murmur    Helicobacter pylori (H. pylori) infection 08/24/2015   Hiatal hernia    History of 2019 novel coronavirus disease (COVID-19) 09/09/2020   a.) PCR testing (+): 09/09/2020, 01/12/2021, 07/17/2021   HLD (hyperlipidemia)    HTN (hypertension)    Hx MRSA infection    Hypothyroidism    Influenza A 10/01/2022   Lipoma of arm 05/11/2013   Lower extremity edema    Lumbago    Lymphedema 04/26/2016   Medial meniscus tear (LEFT)    Migraine with aura    Multinodular goiter    Nephrolithiasis    Opioid dependence (HCC)    a.) on buprenorphine   OSA on CPAP    Plantar fascial fibromatosis    Pneumonia due to COVID-19 virus    Post traumatic stress disorder (PTSD)    Pre-diabetes    PVD (peripheral vascular disease)    Seasonal allergies    Shortness of breath on exertion    Stress incontinence    Swelling of limb 04/26/2016   Urge incontinence    Venous insufficiency    Venous ulcer (HCC)    Vitamin D  deficiency     Past Surgical History:  Procedure Laterality Date   ABDOMINAL HYSTERECTOMY     ANKLE SURGERY     CHOLECYSTECTOMY     COLONOSCOPY WITH PROPOFOL  N/A 06/24/2019   Procedure: COLONOSCOPY WITH PROPOFOL ;  Surgeon: Toledo, Ladell POUR, MD;  Location: [facility];  Service: Gastroenterology;  Laterality: N/A;   CYSTOSCOPY N/A 04/09/2016   Procedure:  CYSTOSCOPY;  Surgeon: Glendia Elizabeth, MD;  Location: [facility];  Service: Urology;  Laterality: N/A;   DILATION AND CURETTAGE OF UTERUS     ESOPHAGOGASTRODUODENOSCOPY (EGD) WITH PROPOFOL  N/A 06/24/2019   Procedure: ESOPHAGOGASTRODUODENOSCOPY (EGD) WITH PROPOFOL ;  Surgeon: Toledo, Ladell POUR, MD;  Location: [facility];  Service: Gastroenterology;  Laterality: N/A;   ESOPHAGOGASTRODUODENOSCOPY (EGD) WITH PROPOFOL  N/A 10/01/2023   Procedure: ESOPHAGOGASTRODUODENOSCOPY (EGD) WITH PROPOFOL ;  Surgeon: Therisa Bi, MD;  Location: [facility];  Service: Gastroenterology;  Laterality: N/A;   INGUINAL HERNIA REPAIR Bilateral 04/18/2017  Procedure: LAPAROSCOPIC BILATERAL INGUINAL HERNIA REPAIR;  Surgeon: Jordis Laneta FALCON, MD;  Location: [facility];  Service: General;  Laterality: Bilateral;   INTERSTIM IMPLANT PLACEMENT N/A 11/26/2016   Procedure: RENNA IMPLANT FIRST STAGE;  Surgeon: Gaston Hamilton, MD;  Location: [facility];  Service: Urology;  Laterality: N/A;   INTERSTIM IMPLANT PLACEMENT N/A 11/26/2016   Procedure: RENNA IMPLANT SECOND STAGE;  Surgeon: Gaston Hamilton, MD;  Location: [facility];  Service: Urology;  Laterality: N/A;   INTERSTIM IMPLANT PLACEMENT N/A 02/18/2023   Procedure: RENNA IMPLANT FIRST STAGE WITH IMPEDANCE CHECK;  Surgeon: Gaston Hamilton, MD;  Location: [facility];  Service: Urology;  Laterality: N/A;   INTERSTIM IMPLANT PLACEMENT N/A 02/18/2023   Procedure: RENNA IMPLANT SECOND STAGE;  Surgeon: Gaston Hamilton, MD;  Location: [facility];  Service: Urology;  Laterality: N/A;   INTERSTIM IMPLANT REMOVAL N/A 02/18/2023   Procedure: REMOVAL OF INTERSTIM IMPLANT;  Surgeon: Gaston Hamilton, MD;  Location: [facility];  Service: Urology;  Laterality: N/A;   IVC FILTER INSERTION N/A 05/19/2024   Procedure: IVC FILTER INSERTION;  Surgeon: Jama Cordella MATSU, MD;  Location: [facility];  Service: Cardiovascular;  Laterality: N/A;   KNEE ARTHROSCOPY Bilateral     KNEE ARTHROSCOPY WITH LATERAL RELEASE Left 10/18/2015   Procedure: KNEE ARTHROSCOPY LATERAL AND PARTIAL SYNOVECTOMY;  Surgeon: Ozell Flake, MD;  Location: [facility];  Service: Orthopedics;  Laterality: Left;   Lymph Node removal  2015   Neck   PUBOVAGINAL SLING N/A 04/09/2016   Procedure: PUBO-VAGINAL SLING/ RETROPUBIC SLING;  Surgeon: Hamilton Gaston, MD;  Location: [facility];  Service: Urology;  Laterality: N/A;   REPLACEMENT TOTAL KNEE Right    SHOULDER SURGERY Right 2014   TOTAL KNEE ARTHROPLASTY Left 05/28/2024   Procedure: ARTHROPLASTY, KNEE, TOTAL;  Surgeon: Lorelle Hussar, MD;  Location: [facility];  Service: Orthopedics;  Laterality: Left;   TUBAL LIGATION     WRIST SURGERY Right    metal plate    Social History Social History   Tobacco Use   Smoking status: Former    Current packs/day: 0.00    Average packs/day: 0.5 packs/day for 11.0 years (5.5 ttl pk-yrs)    Types: Cigarettes    Start date: 07/16/2008    Quit date: 07/16/2019    Years since quitting: 4.9    Passive exposure: Past   Smokeless tobacco: Never   Tobacco comments:    STRESS RELATED  Vaping Use   Vaping status: Never Used  Substance Use Topics   Alcohol use: Not Currently    Comment: Hisotry of ETOH abouse ASB patient   Drug use: Not Currently    Comment: Pt listed history of abuse of prescription drugs    Family History Family History  Adopted: Yes  Family history unknown: Yes    Allergies  Allergen Reactions   Geodon [Ziprasidone Hydrochloride] Other (See Comments)    Numbness ,sob, headaches, blurred vision   Sulfacetamide Sodium Hives   Ziprasidone Anaphylaxis and Other (See Comments)    Other reaction(s): Numbness ,sob, headaches, blurred vision   Ace Inhibitors Rash   Erythromycin Rash and Hives   Hydromorphone  Rash   Lamictal [Lamotrigine] Rash    Other reaction(s): Unknown   Strawberry (Diagnostic) Rash   Sulfa Antibiotics Hives and Rash     REVIEW OF SYSTEMS (Negative  unless checked)  Constitutional: [] Weight loss  [] Fever  [] Chills Cardiac: [] Chest pain   [] Chest pressure   [] Palpitations   [] Shortness of breath when laying flat   []   Shortness of breath with exertion. Vascular:  [] Pain in legs with walking   [x] Pain in legs at rest  [] History of DVT   [] Phlebitis   [x] Swelling in legs   [] Varicose veins   [] Non-healing ulcers Pulmonary:   [] Uses home oxygen   [] Productive cough   [] Hemoptysis   [] Wheeze  [] COPD   [] Asthma Neurologic:  [] Dizziness   [] Seizures   [] History of stroke   [] History of TIA  [] Aphasia   [] Vissual changes   [] Weakness or numbness in arm   [] Weakness or numbness in leg Musculoskeletal:   [] Joint swelling   [x] Joint pain   [x] Low back pain Hematologic:  [] Easy bruising  [] Easy bleeding   [] Hypercoagulable state   [] Anemic Gastrointestinal:  [] Diarrhea   [] Vomiting  [x] Gastroesophageal reflux/heartburn   [] Difficulty swallowing. Genitourinary:  [] Chronic kidney disease   [] Difficult urination  [] Frequent urination   [] Blood in urine Skin:  [] Rashes   [] Ulcers  Psychological:  [] History of anxiety   []  History of major depression.  Physical Examination  Vitals:   06/18/24 1404  BP: 120/80  Pulse: 75  Resp: 18  Weight: 244 lb 6.4 oz (110.9 kg)  Height: 5' 4 (1.626 m)   Body mass index is 41.95 kg/m. Gen: WD/WN, NAD Head: Quinn/AT, No temporalis wasting.  Ear/Nose/Throat: Hearing grossly intact, nares w/o erythema or drainage, pinna without lesions Eyes: PER, EOMI, sclera nonicteric.  Neck: Supple, no gross masses.  No JVD.  Pulmonary:  Good air movement, no audible wheezing, no use of accessory muscles.  Cardiac: RRR, precordium not hyperdynamic. Vascular:  scattered varicosities present bilaterally.  Moderate venous stasis changes to the legs bilaterally.  trace soft pitting edema. CEAP [id number]   Vessel Right Left  Radial Palpable Palpable  Gastrointestinal: soft, non-distended. No guarding/no peritoneal signs.   Musculoskeletal: M/S 5/5 throughout.  No deformity.  Neurologic: CN 2-12 intact. Pain and light touch intact in extremities.  Symmetrical.  Speech is fluent. Motor exam as listed above. Psychiatric: Judgment intact, Mood & affect appropriate for pt's clinical situation. Dermatologic: Venous rashes no ulcers noted.  No changes consistent with cellulitis. Lymph : No lichenification or skin changes of chronic lymphedema.  CBC Lab Results  Component Value Date   WBC 12.5 (H) 05/29/2024   HGB 10.5 (L) 05/29/2024   HCT 33.2 (L) 05/29/2024   MCV 89.7 05/29/2024   PLT 290 05/29/2024    BMET    Component Value Date/Time   NA 137 05/29/2024 0630   NA 143 09/27/2023 0939   NA 138 11/07/2012 0517   K 4.1 05/29/2024 0630   K 3.9 11/07/2012 0517   CL 102 05/29/2024 0630   CL 109 (H) 11/07/2012 0517   CO2 28 05/29/2024 0630   CO2 25 11/07/2012 0517   GLUCOSE 139 (H) 05/29/2024 0630   GLUCOSE 91 11/07/2012 0517   BUN 10 05/29/2024 0630   BUN 12 09/27/2023 0939   BUN 7 11/07/2012 0517   CREATININE 0.57 05/29/2024 0630   CREATININE 0.76 11/07/2012 0517   CALCIUM  8.4 (L) 05/29/2024 0630   CALCIUM  7.9 (L) 11/07/2012 0517   GFRNONAA >60 05/29/2024 0630   GFRNONAA >60 11/07/2012 0517   GFRAA >60 02/17/2020 0000   GFRAA >60 11/07/2012 0517   Estimated Creatinine Clearance: 96.8 mL/min (by C-G formula based on SCr of 0.57 mg/dL).  COAG Lab Results  Component Value Date   INR 1.03 04/03/2016    Radiology US  Venous Img Lower Unilateral Left (DVT) Result Date: 06/10/2024 EXAM: ULTRASOUND DUPLEX  OF THE LEFT LOWER EXTREMITY VEINS TECHNIQUE: Duplex ultrasound using B-mode/gray scaled imaging and Doppler spectral analysis and color flow was obtained of the deep venous structures of the left lower extremity. COMPARISON: US  Left Lower Extremity 01/09/2024. CLINICAL HISTORY: Knee swelling, status post total knee replacement. FINDINGS: The common femoral vein, femoral vein, popliteal vein, and  posterior tibial vein of the left lower extremity demonstrate normal compressibility with normal color flow and spectral analysis. Limited images of the contralateral right common femoral vein are normal. Prominent morphologically unremarkable inguinal lymph nodes measuring up to 1.1 cm short axis diameter incidentally noted. IMPRESSION: 1. No evidence of left lower extremity deep venous thrombosis. Electronically signed by: Katheleen Faes MD 06/10/2024 01:45 PM EST RP Workstation: [id number]   DG Knee Left Port Result Date: 05/28/2024 CLINICAL DATA:  Left knee replacement. EXAM: PORTABLE LEFT KNEE - 1-2 VIEW COMPARISON:  12/13/2017 FINDINGS: Evidence of patient's recent left total knee arthroplasty with prosthetic components intact and normally located. Remainder of the exam is unremarkable. IMPRESSION: Expected changes post left total knee arthroplasty. Electronically Signed   By: Toribio Agreste M.D.   On: 05/28/2024 15:21     Assessment/Plan 1. History of DVT (deep vein thrombosis) (Primary) The patient has done well status post joint replacement surgery.  Therefore, I recommend that we remove the IVC filter.  Risk and benefits were reviewed all questions answered patient has agreed to proceed.  Patient will continue to elevate to minimize swelling.  Given the history of DVT and the possibility of post phlebitic changes such as swelling and discomfort the patient should wear graduated compression stockings.  The compression should be worn on a daily basis.  In addition, behavioral modification including elevation during the day and avoidance of prolonged dependency is helpful.    The patient will follow-up with me after the filter removal.  2. S/P TKR (total knee replacement), left The patient has done well status post joint replacement surgery.  Therefore, I recommend that we remove the IVC filter.  Risk and benefits were reviewed all questions answered patient has agreed to  proceed.  Patient will continue to elevate to minimize swelling.  Given the history of DVT and the possibility of post phlebitic changes such as swelling and discomfort the patient should wear graduated compression stockings.  The compression should be worn on a daily basis.  In addition, behavioral modification including elevation during the day and avoidance of prolonged dependency is helpful.    The patient will follow-up with me after the filter removal.  3. Hyperlipidemia, unspecified hyperlipidemia type Continue statin as ordered and reviewed, no changes at this time  4. Gastroesophageal reflux disease, unspecified whether esophagitis present Continue PPI as already ordered, this medication has been reviewed and there are no changes at this time.  Avoidence of caffeine and alcohol  Moderate elevation of the head of the bed     Cordella Shawl, MD  06/18/2024 2:27 PM   "

## 2024-06-18 NOTE — Progress Notes (Unsigned)
 MRN : [id number]  Alexis Christensen is a 53 y.o. (1971-04-08) female who presents with chief complaint of legs hurt and swell.  History of Present Illness:   The patient presents to the office for follow-up status post IVC filter placement prior to joint replacement surgery.  The patient has a history of DVT and therefore was at high risk for recurrence in the perioperative timeframe after undergoing a total joint replacement.    Left TKR surgery was on 05/28/2024.  The patient has been doing well with rehab and their mobility is increasing.  The patient notes the affected leg is not increasingly swollen or painful.  No signs and symptoms consistent with recurrence of DVT in the lower extremity.  Patient does note that as a baseline the affected extremity tends to swell with prolonged dependence, symptoms are much better with elevation.  The patient notes minimal edema in the morning which steadily worsens throughout the day.  The patient has not been using compression therapy at this point.  The patient is not on longterm anticoagulation.  No SOB or pleuritic chest pains.  No cough or hemoptysis.  No blood per rectum or blood in any sputum.  No excessive bruising per the patient.   No recent shortening of the patient's walking distance or new symptoms consistent with claudication.  No history of rest pain symptoms. No new ulcers or wounds of the lower extremities have occurred.  The patient denies amaurosis fugax or recent TIA symptoms. There are no recent neurological changes noted. No recent episodes of angina or shortness of breath documented.   Current Meds  Medication Sig   acetaminophen  (TYLENOL ) 500 MG tablet Take 2 tablets (1,000 mg total) by mouth every 8 (eight) hours.   albuterol  (VENTOLIN  HFA) 108 (90 Base) MCG/ACT inhaler Inhale 1 puff into the lungs every 6 (six) hours as needed for wheezing or shortness of breath.   apixaban  (ELIQUIS ) 2.5 MG TABS tablet Take 1  tablet (2.5 mg total) by mouth 2 (two) times daily for 28 days.   ARIPiprazole  (ABILIFY ) 30 MG tablet Take 30 mg by mouth daily.   Budeson-Glycopyrrol-Formoterol  (BREZTRI  AEROSPHERE) 160-9-4.8 MCG/ACT AERO Inhale 2 puffs into the lungs in the morning and at bedtime.   colestipol  (COLESTID ) 1 g tablet TAKE 2 TABLETS(2 GRAMS) BY MOUTH TWICE DAILY   dicyclomine (BENTYL) 10 MG capsule TAKE 1 CAPSULE BY MOUTH EVERY 8 HOURS AS NEEDED FOR ABDOMINAL PAIN   docusate sodium  (COLACE) 100 MG capsule Take 1 capsule (100 mg total) by mouth 2 (two) times daily.   DULoxetine  (CYMBALTA ) 60 MG capsule Take 60 mg by mouth 2 (two) times daily.   esomeprazole  (NEXIUM ) 40 MG capsule Take 1 capsule (40 mg total) by mouth 2 (two) times daily before a meal.   gabapentin  (NEURONTIN ) 600 MG tablet Take 600 mg by mouth 2 (two) times daily.    hydrOXYzine  (ATARAX /VISTARIL ) 25 MG tablet Take 50 mg by mouth every 8 (eight) hours as needed for itching.   levothyroxine  (SYNTHROID ) 112 MCG tablet Take 112 mcg by mouth daily before breakfast.   lisinopril  (ZESTRIL ) 10 MG tablet Take 1 tablet (10 mg total) by mouth daily.   metoprolol  tartrate (LOPRESSOR ) 50 MG tablet Take 50 mg by mouth daily in the afternoon.   nortriptyline  (PAMELOR ) 10 MG capsule Take 10 mg in the morning   ondansetron  (ZOFRAN -ODT) 4 MG disintegrating tablet Take 1 tablet (4 mg total) by mouth every 6 (six)  hours as needed for nausea.   oxyCODONE  (ROXICODONE ) 5 MG immediate release tablet Take 1 tablet (5 mg total) by mouth every 6 (six) hours as needed for breakthrough pain.   rosuvastatin  (CRESTOR ) 40 MG tablet Take 40 mg by mouth daily.   spironolactone  (ALDACTONE ) 25 MG tablet Take 1 tablet (25 mg total) by mouth once for 1 dose.   traMADol  (ULTRAM ) 50 MG tablet Take 1 tablet (50 mg total) by mouth every 6 (six) hours as needed for moderate pain (pain score 4-6).   triamcinolone  cream (KENALOG ) 0.1 % Apply 1 Application topically 2 (two) times daily.     Past Medical History:  Diagnosis Date   (HFpEF) heart failure with preserved ejection fraction (HCC)    Acute deep vein thrombosis (DVT) of right lower extremity (HCC) 2016   ADHD    Adopted    ANA positive    Anemia    Anxiety    Arthritis    Asthma    B12 deficiency    Bilateral swelling of feet and ankles    Bipolar 1 disorder (HCC)    Carpal tunnel syndrome of left wrist    Chest pain    Chronic diarrhea 09/07/2015   Chronic left shoulder pain    Clostridioides difficile infection 04/06/2019   Depression    Diverticulosis    Edema leg    GERD (gastroesophageal reflux disease)    Heart murmur    Helicobacter pylori (H. pylori) infection 08/24/2015   Hiatal hernia    History of 2019 novel coronavirus disease (COVID-19) 09/09/2020   a.) PCR testing (+): 09/09/2020, 01/12/2021, 07/17/2021   HLD (hyperlipidemia)    HTN (hypertension)    Hx MRSA infection    Hypothyroidism    Influenza A 10/01/2022   Lipoma of arm 05/11/2013   Lower extremity edema    Lumbago    Lymphedema 04/26/2016   Medial meniscus tear (LEFT)    Migraine with aura    Multinodular goiter    Nephrolithiasis    Opioid dependence (HCC)    a.) on buprenorphine   OSA on CPAP    Plantar fascial fibromatosis    Pneumonia due to COVID-19 virus    Post traumatic stress disorder (PTSD)    Pre-diabetes    PVD (peripheral vascular disease)    Seasonal allergies    Shortness of breath on exertion    Stress incontinence    Swelling of limb 04/26/2016   Urge incontinence    Venous insufficiency    Venous ulcer (HCC)    Vitamin D  deficiency     Past Surgical History:  Procedure Laterality Date   ABDOMINAL HYSTERECTOMY     ANKLE SURGERY     CHOLECYSTECTOMY     COLONOSCOPY WITH PROPOFOL  N/A 06/24/2019   Procedure: COLONOSCOPY WITH PROPOFOL ;  Surgeon: Toledo, Ladell POUR, MD;  Location: [facility];  Service: Gastroenterology;  Laterality: N/A;   CYSTOSCOPY N/A 04/09/2016   Procedure:  CYSTOSCOPY;  Surgeon: Glendia Elizabeth, MD;  Location: [facility];  Service: Urology;  Laterality: N/A;   DILATION AND CURETTAGE OF UTERUS     ESOPHAGOGASTRODUODENOSCOPY (EGD) WITH PROPOFOL  N/A 06/24/2019   Procedure: ESOPHAGOGASTRODUODENOSCOPY (EGD) WITH PROPOFOL ;  Surgeon: Toledo, Ladell POUR, MD;  Location: [facility];  Service: Gastroenterology;  Laterality: N/A;   ESOPHAGOGASTRODUODENOSCOPY (EGD) WITH PROPOFOL  N/A 10/01/2023   Procedure: ESOPHAGOGASTRODUODENOSCOPY (EGD) WITH PROPOFOL ;  Surgeon: Therisa Bi, MD;  Location: [facility];  Service: Gastroenterology;  Laterality: N/A;   INGUINAL HERNIA REPAIR Bilateral 04/18/2017  Procedure: LAPAROSCOPIC BILATERAL INGUINAL HERNIA REPAIR;  Surgeon: Jordis Laneta FALCON, MD;  Location: [facility];  Service: General;  Laterality: Bilateral;   INTERSTIM IMPLANT PLACEMENT N/A 11/26/2016   Procedure: RENNA IMPLANT FIRST STAGE;  Surgeon: Gaston Hamilton, MD;  Location: [facility];  Service: Urology;  Laterality: N/A;   INTERSTIM IMPLANT PLACEMENT N/A 11/26/2016   Procedure: RENNA IMPLANT SECOND STAGE;  Surgeon: Gaston Hamilton, MD;  Location: [facility];  Service: Urology;  Laterality: N/A;   INTERSTIM IMPLANT PLACEMENT N/A 02/18/2023   Procedure: RENNA IMPLANT FIRST STAGE WITH IMPEDANCE CHECK;  Surgeon: Gaston Hamilton, MD;  Location: [facility];  Service: Urology;  Laterality: N/A;   INTERSTIM IMPLANT PLACEMENT N/A 02/18/2023   Procedure: RENNA IMPLANT SECOND STAGE;  Surgeon: Gaston Hamilton, MD;  Location: [facility];  Service: Urology;  Laterality: N/A;   INTERSTIM IMPLANT REMOVAL N/A 02/18/2023   Procedure: REMOVAL OF INTERSTIM IMPLANT;  Surgeon: Gaston Hamilton, MD;  Location: [facility];  Service: Urology;  Laterality: N/A;   IVC FILTER INSERTION N/A 05/19/2024   Procedure: IVC FILTER INSERTION;  Surgeon: Jama Cordella MATSU, MD;  Location: [facility];  Service: Cardiovascular;  Laterality: N/A;   KNEE ARTHROSCOPY Bilateral     KNEE ARTHROSCOPY WITH LATERAL RELEASE Left 10/18/2015   Procedure: KNEE ARTHROSCOPY LATERAL AND PARTIAL SYNOVECTOMY;  Surgeon: Ozell Flake, MD;  Location: [facility];  Service: Orthopedics;  Laterality: Left;   Lymph Node removal  2015   Neck   PUBOVAGINAL SLING N/A 04/09/2016   Procedure: PUBO-VAGINAL SLING/ RETROPUBIC SLING;  Surgeon: Hamilton Gaston, MD;  Location: [facility];  Service: Urology;  Laterality: N/A;   REPLACEMENT TOTAL KNEE Right    SHOULDER SURGERY Right 2014   TOTAL KNEE ARTHROPLASTY Left 05/28/2024   Procedure: ARTHROPLASTY, KNEE, TOTAL;  Surgeon: Lorelle Hussar, MD;  Location: [facility];  Service: Orthopedics;  Laterality: Left;   TUBAL LIGATION     WRIST SURGERY Right    metal plate    Social History Social History   Tobacco Use   Smoking status: Former    Current packs/day: 0.00    Average packs/day: 0.5 packs/day for 11.0 years (5.5 ttl pk-yrs)    Types: Cigarettes    Start date: 07/16/2008    Quit date: 07/16/2019    Years since quitting: 4.9    Passive exposure: Past   Smokeless tobacco: Never   Tobacco comments:    STRESS RELATED  Vaping Use   Vaping status: Never Used  Substance Use Topics   Alcohol use: Not Currently    Comment: Hisotry of ETOH abouse ASB patient   Drug use: Not Currently    Comment: Pt listed history of abuse of prescription drugs    Family History Family History  Adopted: Yes  Family history unknown: Yes    Allergies  Allergen Reactions   Geodon [Ziprasidone Hydrochloride] Other (See Comments)    Numbness ,sob, headaches, blurred vision   Sulfacetamide Sodium Hives   Ziprasidone Anaphylaxis and Other (See Comments)    Other reaction(s): Numbness ,sob, headaches, blurred vision   Ace Inhibitors Rash   Erythromycin Rash and Hives   Hydromorphone  Rash   Lamictal [Lamotrigine] Rash    Other reaction(s): Unknown   Strawberry (Diagnostic) Rash   Sulfa Antibiotics Hives and Rash     REVIEW OF SYSTEMS (Negative  unless checked)  Constitutional: [] Weight loss  [] Fever  [] Chills Cardiac: [] Chest pain   [] Chest pressure   [] Palpitations   [] Shortness of breath when laying flat   []   Shortness of breath with exertion. Vascular:  [] Pain in legs with walking   [x] Pain in legs at rest  [] History of DVT   [] Phlebitis   [x] Swelling in legs   [] Varicose veins   [] Non-healing ulcers Pulmonary:   [] Uses home oxygen   [] Productive cough   [] Hemoptysis   [] Wheeze  [] COPD   [] Asthma Neurologic:  [] Dizziness   [] Seizures   [] History of stroke   [] History of TIA  [] Aphasia   [] Vissual changes   [] Weakness or numbness in arm   [] Weakness or numbness in leg Musculoskeletal:   [] Joint swelling   [x] Joint pain   [x] Low back pain Hematologic:  [] Easy bruising  [] Easy bleeding   [] Hypercoagulable state   [] Anemic Gastrointestinal:  [] Diarrhea   [] Vomiting  [x] Gastroesophageal reflux/heartburn   [] Difficulty swallowing. Genitourinary:  [] Chronic kidney disease   [] Difficult urination  [] Frequent urination   [] Blood in urine Skin:  [] Rashes   [] Ulcers  Psychological:  [] History of anxiety   []  History of major depression.  Physical Examination  Vitals:   06/18/24 1404  BP: 120/80  Pulse: 75  Resp: 18  Weight: 244 lb 6.4 oz (110.9 kg)  Height: 5' 4 (1.626 m)   Body mass index is 41.95 kg/m. Gen: WD/WN, NAD Head: [place]/AT, No temporalis wasting.  Ear/Nose/Throat: Hearing grossly intact, nares w/o erythema or drainage, pinna without lesions Eyes: PER, EOMI, sclera nonicteric.  Neck: Supple, no gross masses.  No JVD.  Pulmonary:  Good air movement, no audible wheezing, no use of accessory muscles.  Cardiac: RRR, precordium not hyperdynamic. Vascular:  scattered varicosities present bilaterally.  Moderate venous stasis changes to the legs bilaterally.  trace soft pitting edema. CEAP [id number]   Vessel Right Left  Radial Palpable Palpable  Gastrointestinal: soft, non-distended. No guarding/no peritoneal signs.   Musculoskeletal: M/S 5/5 throughout.  No deformity.  Neurologic: CN 2-12 intact. Pain and light touch intact in extremities.  Symmetrical.  Speech is fluent. Motor exam as listed above. Psychiatric: Judgment intact, Mood & affect appropriate for pt's clinical situation. Dermatologic: Venous rashes no ulcers noted.  No changes consistent with cellulitis. Lymph : No lichenification or skin changes of chronic lymphedema.  CBC Lab Results  Component Value Date   WBC 12.5 (H) 05/29/2024   HGB 10.5 (L) 05/29/2024   HCT 33.2 (L) 05/29/2024   MCV 89.7 05/29/2024   PLT 290 05/29/2024    BMET    Component Value Date/Time   NA 137 05/29/2024 0630   NA 143 09/27/2023 0939   NA 138 11/07/2012 0517   K 4.1 05/29/2024 0630   K 3.9 11/07/2012 0517   CL 102 05/29/2024 0630   CL 109 (H) 11/07/2012 0517   CO2 28 05/29/2024 0630   CO2 25 11/07/2012 0517   GLUCOSE 139 (H) 05/29/2024 0630   GLUCOSE 91 11/07/2012 0517   BUN 10 05/29/2024 0630   BUN 12 09/27/2023 0939   BUN 7 11/07/2012 0517   CREATININE 0.57 05/29/2024 0630   CREATININE 0.76 11/07/2012 0517   CALCIUM  8.4 (L) 05/29/2024 0630   CALCIUM  7.9 (L) 11/07/2012 0517   GFRNONAA >60 05/29/2024 0630   GFRNONAA >60 11/07/2012 0517   GFRAA >60 02/17/2020 0000   GFRAA >60 11/07/2012 0517   Estimated Creatinine Clearance: 96.8 mL/min (by C-G formula based on SCr of 0.57 mg/dL).  COAG Lab Results  Component Value Date   INR 1.03 04/03/2016    Radiology US  Venous Img Lower Unilateral Left (DVT) Result Date: 06/10/2024 EXAM: ULTRASOUND DUPLEX  OF THE LEFT LOWER EXTREMITY VEINS TECHNIQUE: Duplex ultrasound using B-mode/gray scaled imaging and Doppler spectral analysis and color flow was obtained of the deep venous structures of the left lower extremity. COMPARISON: US  Left Lower Extremity 01/09/2024. CLINICAL HISTORY: Knee swelling, status post total knee replacement. FINDINGS: The common femoral vein, femoral vein, popliteal vein, and  posterior tibial vein of the left lower extremity demonstrate normal compressibility with normal color flow and spectral analysis. Limited images of the contralateral right common femoral vein are normal. Prominent morphologically unremarkable inguinal lymph nodes measuring up to 1.1 cm short axis diameter incidentally noted. IMPRESSION: 1. No evidence of left lower extremity deep venous thrombosis. Electronically signed by: Katheleen Faes MD 06/10/2024 01:45 PM EST RP Workstation: [id number]   DG Knee Left Port Result Date: 05/28/2024 CLINICAL DATA:  Left knee replacement. EXAM: PORTABLE LEFT KNEE - 1-2 VIEW COMPARISON:  12/13/2017 FINDINGS: Evidence of patient's recent left total knee arthroplasty with prosthetic components intact and normally located. Remainder of the exam is unremarkable. IMPRESSION: Expected changes post left total knee arthroplasty. Electronically Signed   By: Toribio Agreste M.D.   On: 05/28/2024 15:21     Assessment/Plan 1. History of DVT (deep vein thrombosis) (Primary) The patient has done well status post joint replacement surgery.  Therefore, I recommend that we remove the IVC filter.  Risk and benefits were reviewed all questions answered patient has agreed to proceed.  Patient will continue to elevate to minimize swelling.  Given the history of DVT and the possibility of post phlebitic changes such as swelling and discomfort the patient should wear graduated compression stockings.  The compression should be worn on a daily basis.  In addition, behavioral modification including elevation during the day and avoidance of prolonged dependency is helpful.    The patient will follow-up with me after the filter removal.  2. S/P TKR (total knee replacement), left The patient has done well status post joint replacement surgery.  Therefore, I recommend that we remove the IVC filter.  Risk and benefits were reviewed all questions answered patient has agreed to  proceed.  Patient will continue to elevate to minimize swelling.  Given the history of DVT and the possibility of post phlebitic changes such as swelling and discomfort the patient should wear graduated compression stockings.  The compression should be worn on a daily basis.  In addition, behavioral modification including elevation during the day and avoidance of prolonged dependency is helpful.    The patient will follow-up with me after the filter removal.  3. Hyperlipidemia, unspecified hyperlipidemia type Continue statin as ordered and reviewed, no changes at this time  4. Gastroesophageal reflux disease, unspecified whether esophagitis present Continue PPI as already ordered, this medication has been reviewed and there are no changes at this time.  Avoidence of caffeine and alcohol  Moderate elevation of the head of the bed     Cordella Shawl, MD  06/18/2024 2:27 PM

## 2024-06-20 ENCOUNTER — Encounter (INDEPENDENT_AMBULATORY_CARE_PROVIDER_SITE_OTHER): Payer: Self-pay | Admitting: Vascular Surgery

## 2024-06-21 ENCOUNTER — Emergency Department
Admission: EM | Admit: 2024-06-21 | Discharge: 2024-06-22 | Disposition: A | Attending: Emergency Medicine | Admitting: Emergency Medicine

## 2024-06-21 ENCOUNTER — Other Ambulatory Visit: Payer: Self-pay

## 2024-06-21 ENCOUNTER — Emergency Department

## 2024-06-21 ENCOUNTER — Encounter: Payer: Self-pay | Admitting: Emergency Medicine

## 2024-06-21 DIAGNOSIS — R600 Localized edema: Secondary | ICD-10-CM

## 2024-06-21 LAB — BASIC METABOLIC PANEL WITH GFR
Anion gap: 11 (ref 5–15)
BUN: 12 mg/dL (ref 6–20)
CO2: 28 mmol/L (ref 22–32)
Calcium: 9.1 mg/dL (ref 8.9–10.3)
Chloride: 101 mmol/L (ref 98–111)
Creatinine, Ser: 0.62 mg/dL (ref 0.44–1.00)
GFR, Estimated: >60 mL/min (ref >60)
Glucose, Bld: 142 mg/dL — ABNORMAL HIGH (ref 70–99)
Potassium: 3.6 mmol/L (ref 3.5–5.1)
Sodium: 140 mmol/L (ref 135–145)

## 2024-06-21 LAB — CBC
HCT: 34.8 % — ABNORMAL LOW (ref 36.0–46.0)
Hemoglobin: 10.9 g/dL — ABNORMAL LOW (ref 12.0–15.0)
MCH: 28.4 pg (ref 26.0–34.0)
MCHC: 31.3 g/dL (ref 30.0–36.0)
MCV: 90.6 fL (ref 80.0–100.0)
Platelets: 419 K/uL — ABNORMAL HIGH (ref 150–400)
RBC: 3.84 MIL/uL — ABNORMAL LOW (ref 3.87–5.11)
RDW: 13.6 % (ref 11.5–15.5)
WBC: 7.5 K/uL (ref 4.0–10.5)
nRBC: 0 % (ref 0.0–0.2)

## 2024-06-21 LAB — PROTIME-INR
INR: 1 (ref 0.8–1.2)
Prothrombin Time: 13.8 s (ref 11.4–15.2)

## 2024-06-21 NOTE — ED Provider Notes (Signed)
 [facility] Provider Note    Event Date/Time   First MD Initiated Contact with Patient 06/21/24 2238     (approximate)   History   Swelling  HPI  Alexis Christensen is a 53 y.o. female who is followed by vascular surgery.  Note from vascular surgery from December 4 indicates patient has IVC filter and is also on anticoagulation due to history of DVT.  Left total knee replacement November 13    Patient reports she has had persistent swelling for about the knee down to the left ankle since surgery.  Little bit worse for the last week or so.  Saw vascular surgery for same.  Continues to see swelling.  She has not been able to start compression stocking.  No fevers no chills no redness.  She reports chronic lymphedema in both legs and the swelling from the knee down just does not seem to get better.  The skin continues to feel tight.  She did take a few days of fluid pills as prescribed by her doctor but reports that did not see any improvement.  No shortness of breath no chest pain no fevers.  No numbness or weakness.  Elevating the leg but has not had Esclim  compression stockings yet plans to order them online  She has been taking her Eliquis  regularly, she has IVC filter.  Has follow-up planned at the end of month with Dr. Luna   Past Medical History:  Diagnosis Date   (HFpEF) heart failure with preserved ejection fraction (HCC)    Acute deep vein thrombosis (DVT) of right lower extremity (HCC) 2016   ADHD    Adopted    ANA positive    Anemia    Anxiety    Arthritis    Asthma    B12 deficiency    Bilateral swelling of feet and ankles    Bipolar 1 disorder (HCC)    Carpal tunnel syndrome of left wrist    Chest pain    Chronic diarrhea 09/07/2015   Chronic left shoulder pain    Clostridioides difficile infection 04/06/2019   Depression    Diverticulosis    Edema leg    GERD (gastroesophageal reflux disease)    Heart murmur    Helicobacter pylori  (H. pylori) infection 08/24/2015   Hiatal hernia    History of 2019 novel coronavirus disease (COVID-19) 09/09/2020   a.) PCR testing (+): 09/09/2020, 01/12/2021, 07/17/2021   HLD (hyperlipidemia)    HTN (hypertension)    Hx MRSA infection    Hypothyroidism    Influenza A 10/01/2022   Lipoma of arm 05/11/2013   Lower extremity edema    Lumbago    Lymphedema 04/26/2016   Medial meniscus tear (LEFT)    Migraine with aura    Multinodular goiter    Nephrolithiasis    Opioid dependence (HCC)    a.) on buprenorphine   OSA on CPAP    Plantar fascial fibromatosis    Pneumonia due to COVID-19 virus    Post traumatic stress disorder (PTSD)    Pre-diabetes    PVD (peripheral vascular disease)    Seasonal allergies    Shortness of breath on exertion    Stress incontinence    Swelling of limb 04/26/2016   Urge incontinence    Venous insufficiency    Venous ulcer (HCC)    Vitamin D  deficiency     Physical Exam   Triage Vital Signs: ED Triage Vitals  Encounter Vitals Group  BP 06/21/24 2026 129/72     Girls Systolic BP Percentile --      Girls Diastolic BP Percentile --      Boys Systolic BP Percentile --      Boys Diastolic BP Percentile --      Pulse Rate 06/21/24 2026 80     Resp 06/21/24 2026 17     Temp 06/21/24 2026 98.2 F (36.8 C)     Temp Source 06/21/24 2026 Oral     SpO2 06/21/24 2026 100 %     Weight 06/21/24 2024 245 lb (111.1 kg)     Height 06/21/24 2024 5' 4 (1.626 m)     Head Circumference --      Peak Flow --      Pain Score 06/21/24 2023 8     Pain Loc --      Pain Education --      Exclude from Growth Chart --     Most recent vital signs: Vitals:   06/21/24 2026  BP: 129/72  Pulse: 80  Resp: 17  Temp: 98.2 F (36.8 C)  SpO2: 100%     General: Awake, no distress.  CV:  Good peripheral perfusion.  Warm well-perfused.  Palpable pulses dorsalis pedis bilateral.  Warm well-perfused left leg.  See clinical media upload Resp:  Normal effort.   Abd:  No distention.  Soft Other:  Both lower extremities with moderate edema slight varicosities.  Left leg slightly more swollen from approximately the knee down to the level of the ankle as patient reported.  She reports no change in her right leg and appears quite normal for her.  No pain or discomfort.  Skin does not appear extremely taut, there is no fluid leak, there is no purpura or ischemic changes.  The foot is warm and well-perfused.  No obvious venous cords.  The surgical site is very clean dry intact with no erythema.  Overall well-appearing uses the leg moves the leg without issue.  No cyst or erythema on the backside   ED Results / Procedures / Treatments   Labs (all labs ordered are listed, but only abnormal results are displayed) Labs Reviewed  BASIC METABOLIC PANEL WITH GFR - Abnormal; Notable for the following components:      Result Value   Glucose, Bld 142 (*)    All other components within normal limits  CBC - Abnormal; Notable for the following components:   RBC 3.84 (*)    Hemoglobin 10.9 (*)    HCT 34.8 (*)    Platelets 419 (*)    All other components within normal limits  PROTIME-INR   Labs demonstrate mild anemia.  EKG  Inter by me at 2035 heart rate 75 QRS 90 QTc 440 Normal sinus rhythm, no evidence of frank ischemia.  Slight artifact present   RADIOLOGY  Left lower extremity ultrasound pending   PROCEDURES:  Critical Care performed: No  Procedures   MEDICATIONS ORDERED IN ED: Medications - No data to display   IMPRESSION / MDM / ASSESSMENT AND PLAN / ED COURSE  I reviewed the triage vital signs and the nursing notes.                              Differential diagnosis includes, but is not limited to, exclude DVT, probable ongoing edema secondary to surgery with underlying history of lymphedema, overall low risk with her active anticoagulation she also has an  IVC filter she has no findings signs or symptoms of suggest PE and her exam is  quite reassuring.  She has had previous ultrasound demonstrating no DVT postsurgery, saw Dr. Vascular surgery just a few days ago as well was encouraged to get compression stockings.  Exam very reassuring clinically highly doubt acute DVT but given her history we will exclude.  There is no evidence of infection no erythema appears to be healing well strong good neurovascular function.  Frankly I do not see any evidence of complication at this point  Patient's presentation is most consistent with acute complicated illness / injury requiring diagnostic workup.   Dr. Neomi to follow-up on DVT ultrasound       FINAL CLINICAL IMPRESSION(S) / ED DIAGNOSES   Final diagnoses:  Leg edema, left     Rx / DC Orders   ED Discharge Orders     None        Note:  This document was prepared using Dragon voice recognition software and may include unintentional dictation errors.   Dicky Anes, MD 06/21/24 [phone]

## 2024-06-21 NOTE — ED Triage Notes (Signed)
 Pt presents with swelling from left foot to left knee after left knee replacement 11/13, pt reports she has seen ortho and vein/vascular since surgery and has been on eliquis , furosemide , elevating, and icing with no relief

## 2024-06-21 NOTE — ED Provider Notes (Signed)
 11:08 PM  Assumed care at shift change.  Pt with h/o DVT on Eliquis  and IVC filter.  Here with LLE swelling.  Recent knee replacement.  Sees Schnier with vascular.  DVT study pending.  12:37 AM  Pt's DVT ultrasound shows no acute DVT.  Her leg is warm and well-perfused.  Incision site is clean, dry and intact and healing well.  Soft compartments.  No fever, leukocytosis.  Is getting compression hose for home.  Recommended elevation.  Has vascular surgery follow-up.  She is safe for discharge.   Jesenia Spera, Josette SAILOR, DO 06/22/24 [phone]

## 2024-06-21 NOTE — Discharge Instructions (Signed)
 Please obtain compression stocking as advised by vascular surgery.  Continue to elevate.  Continue current medications and treatment.  Please call vascular clinic and orthopedics clinic to schedule closer follow-ups.  Return to the ER right away if you develop any chest pain difficulty breathing, redness, leakage of fluid, severe pain numbness weakness cold or blue foot or other concerns arise.

## 2024-06-29 ENCOUNTER — Telehealth (INDEPENDENT_AMBULATORY_CARE_PROVIDER_SITE_OTHER): Payer: Self-pay

## 2024-06-29 NOTE — Telephone Encounter (Signed)
 Spoke with the patient on Friday and advised that she will be scheduled for a IVC filter removal on 07/07/24 with a 1:00 pm arrival time to the [facility] with Dr. Jama. Patient has been scheduled and the pre-procedure instructions will be sent to Mychart and mailed.

## 2024-07-07 ENCOUNTER — Encounter: Payer: Self-pay | Admitting: Vascular Surgery

## 2024-07-07 ENCOUNTER — Other Ambulatory Visit: Payer: Self-pay

## 2024-07-07 ENCOUNTER — Encounter: Admission: RE | Disposition: A | Payer: Self-pay | Attending: Vascular Surgery

## 2024-07-07 ENCOUNTER — Ambulatory Visit
Admission: RE | Admit: 2024-07-07 | Discharge: 2024-07-07 | Disposition: A | Discharge status: Home / Self Care | Attending: Vascular Surgery | Admitting: Vascular Surgery

## 2024-07-07 DIAGNOSIS — Z7901 Long term (current) use of anticoagulants: Secondary | ICD-10-CM

## 2024-07-07 DIAGNOSIS — I82409 Acute embolism and thrombosis of unspecified deep veins of unspecified lower extremity: Secondary | ICD-10-CM | POA: Diagnosis present

## 2024-07-07 DIAGNOSIS — Z4589 Encounter for adjustment and management of other implanted devices: Secondary | ICD-10-CM | POA: Diagnosis not present

## 2024-07-07 DIAGNOSIS — Z87891 Personal history of nicotine dependence: Secondary | ICD-10-CM | POA: Insufficient documentation

## 2024-07-07 DIAGNOSIS — Z86711 Personal history of pulmonary embolism: Secondary | ICD-10-CM

## 2024-07-07 DIAGNOSIS — K219 Gastro-esophageal reflux disease without esophagitis: Secondary | ICD-10-CM | POA: Diagnosis not present

## 2024-07-07 DIAGNOSIS — Z9889 Other specified postprocedural states: Secondary | ICD-10-CM

## 2024-07-07 DIAGNOSIS — E785 Hyperlipidemia, unspecified: Secondary | ICD-10-CM | POA: Diagnosis not present

## 2024-07-07 DIAGNOSIS — Z86718 Personal history of other venous thrombosis and embolism: Secondary | ICD-10-CM

## 2024-07-07 DIAGNOSIS — Z8701 Personal history of pneumonia (recurrent): Secondary | ICD-10-CM | POA: Diagnosis not present

## 2024-07-07 DIAGNOSIS — Z96653 Presence of artificial knee joint, bilateral: Secondary | ICD-10-CM | POA: Insufficient documentation

## 2024-07-07 DIAGNOSIS — Z96652 Presence of left artificial knee joint: Secondary | ICD-10-CM | POA: Diagnosis not present

## 2024-07-07 HISTORY — PX: IVC FILTER REMOVAL: CATH118246

## 2024-07-07 SURGERY — IVC FILTER REMOVAL
Anesthesia: Moderate Sedation

## 2024-07-07 MED ORDER — MIDAZOLAM HCL (PF) 2 MG/2ML IJ SOLN
INTRAMUSCULAR | Status: DC | PRN
  Administered 2024-07-07: 2 mg via INTRAVENOUS
  Administered 2024-07-07: 1 mg via INTRAVENOUS

## 2024-07-07 MED ORDER — FENTANYL CITRATE (PF) 100 MCG/2ML IJ SOLN
INTRAMUSCULAR | Status: AC
  Filled 2024-07-07: qty 2

## 2024-07-07 MED ORDER — DIPHENHYDRAMINE HCL 50 MG/ML IJ SOLN: 50.0000 mg | Freq: Once | INTRAMUSCULAR | Status: DC | PRN

## 2024-07-07 MED ORDER — CEFAZOLIN SODIUM-DEXTROSE 2-4 GM/100ML-% IV SOLN
2.0000 g | INTRAVENOUS | Status: AC
  Administered 2024-07-07: 2 g via INTRAVENOUS

## 2024-07-07 MED ORDER — METHYLPREDNISOLONE SODIUM SUCC 125 MG IJ SOLR: 125.0000 mg | Freq: Once | INTRAMUSCULAR | Status: DC | PRN

## 2024-07-07 MED ORDER — MIDAZOLAM HCL 2 MG/2ML IJ SOLN
INTRAMUSCULAR | Status: AC
  Filled 2024-07-07: qty 2

## 2024-07-07 MED ORDER — HEPARIN SODIUM (PORCINE) 1000 UNIT/ML IJ SOLN
INTRAMUSCULAR | Status: AC
  Filled 2024-07-07: qty 10

## 2024-07-07 MED ORDER — MIDAZOLAM HCL 2 MG/ML PO SYRP: 8.0000 mg | ORAL_SOLUTION | Freq: Once | ORAL | Status: DC | PRN

## 2024-07-07 MED ORDER — ONDANSETRON HCL 4 MG/2ML IJ SOLN: 4.0000 mg | Freq: Four times a day (QID) | INTRAMUSCULAR | Status: DC | PRN

## 2024-07-07 MED ORDER — HEPARIN (PORCINE) IN NACL 1000-0.9 UT/500ML-% IV SOLN
INTRAVENOUS | Status: DC | PRN
  Administered 2024-07-07: 500 mL

## 2024-07-07 MED ORDER — ACETAMINOPHEN 325 MG PO TABS
ORAL_TABLET | ORAL | Status: AC
  Filled 2024-07-07: qty 2

## 2024-07-07 MED ORDER — SODIUM CHLORIDE 0.9 % IV SOLN: INTRAVENOUS | Status: DC

## 2024-07-07 MED ORDER — IODIXANOL 320 MG/ML IV SOLN
INTRAVENOUS | Status: DC | PRN
  Administered 2024-07-07: 10 mL

## 2024-07-07 MED ORDER — FENTANYL CITRATE (PF) 50 MCG/ML IJ SOSY
PREFILLED_SYRINGE | INTRAMUSCULAR | Status: AC
  Filled 2024-07-07: qty 1

## 2024-07-07 MED ORDER — HYDROCODONE-ACETAMINOPHEN 5-325 MG PO TABS
ORAL_TABLET | ORAL | Status: AC
  Filled 2024-07-07: qty 1

## 2024-07-07 MED ORDER — FENTANYL CITRATE (PF) 100 MCG/2ML IJ SOLN
INTRAMUSCULAR | Status: DC | PRN
  Administered 2024-07-07: 25 ug via INTRAVENOUS
  Administered 2024-07-07: 50 ug via INTRAVENOUS

## 2024-07-07 MED ORDER — FAMOTIDINE 20 MG PO TABS: 40.0000 mg | ORAL_TABLET | Freq: Once | ORAL | Status: DC | PRN

## 2024-07-07 MED ORDER — CEFAZOLIN SODIUM-DEXTROSE 2-4 GM/100ML-% IV SOLN
INTRAVENOUS | Status: AC
  Filled 2024-07-07: qty 100

## 2024-07-07 MED ORDER — ACETAMINOPHEN 325 MG PO TABS
650.0000 mg | ORAL_TABLET | Freq: Four times a day (QID) | ORAL | Status: DC | PRN
  Administered 2024-07-07: 650 mg via ORAL

## 2024-07-07 MED ORDER — LIDOCAINE HCL (PF) 1 % IJ SOLN
INTRAMUSCULAR | Status: DC | PRN
  Administered 2024-07-07: 10 mL

## 2024-07-07 SURGICAL SUPPLY — 7 items
COVER PROBE ULTRASOUND 5X96 (MISCELLANEOUS) IMPLANT
KIT SNARE RETRIEVAL 3-LOOP (MISCELLANEOUS) IMPLANT
NDL ENTRY 21GA 7CM ECHOTIP (NEEDLE) IMPLANT
NEEDLE ENTRY 21GA 7CM ECHOTIP (NEEDLE) IMPLANT
PACK ANGIOGRAPHY (CUSTOM PROCEDURE TRAY) ×1 IMPLANT
SET INTRO CAPELLA COAXIAL (SET/KITS/TRAYS/PACK) IMPLANT
WIRE J 3MM .035X145CM (WIRE) IMPLANT

## 2024-07-07 NOTE — Interval H&P Note (Signed)
 History and Physical Interval Note:  07/07/2024 3:15 PM  Alexis Christensen  has presented today for surgery, with the diagnosis of IVC filter removal    DVT.  The various methods of treatment have been discussed with the patient and family. After consideration of risks, benefits and other options for treatment, the patient has consented to  Procedures: IVC FILTER REMOVAL (N/A) as a surgical intervention.  The patient's history has been reviewed, patient examined, no change in status, stable for surgery.  I have reviewed the patient's chart and labs.  Questions were answered to the patient's satisfaction.     Cordella Shawl

## 2024-07-07 NOTE — Op Note (Signed)
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  ANESTHESIA:  Conscious sedation was administered under my direct supervision by the interventional radiology RN. IV Versed  plus fentanyl  were utilized. Continuous ECG, pulse oximetry and blood pressure was monitored throughout the entire procedure. Conscious sedation was for a total of 25 minutes.  ESTIMATED BLOOD LOSS: Minimal cc  FINDING(S):inferior vena cava is widely patent filter is in place in good position. Filter is removed without incident  SPECIMEN(S):  IVC filter intact  INDICATIONS:   Alexis Christensen is a 53 y.o. female who presents with DVT and PE. The patient has now tolerated anticoagulation for several months. Therefore, the IVC filter is recommended to be removed. The risks and benefits were reviewed with the patient all questions were answered and they agreed to proceed with IVC filter retrieval. Oral anticoagulation will be continued.  DESCRIPTION: After obtaining full informed written consent, the patient was brought back to the Special Procedure Suite and placed in the supine position.  The patient received IV antibiotics prior to induction.  After obtaining adequate sedation, the patient was prepped and draped in the standard fashion and appropriate time out is called.     Ultrasound was placed in a sterile sleeve.The right neck was then imaged with ultrasound.   Jugular vein was identified it is echolucent and homogeneous indicating patency. 1% lidocaine  is then infiltrated under ultrasound visualization and subsequently a Seldinger needle is inserted under real-time ultrasound guidance.  J-wire is then advanced into the inferior vena cava under fluoroscopic guidance. With the tip of the sheath at the confluence of the  iliac veins inferior vena caval imaging is performed.  After review of the image the sheath is repositioned to above the filter and the snares introduced. Snares opened and the hook is secured without difficulty. The filter is then collapsed within the sheath and removed without difficulty.  Sheath is removed by pressures held the patient tolerated the procedure well and there were no immediate complications.  Interpretation: inferior vena cava is widely patent filter is in place in good position. Filter is removed without incident.     COMPLICATIONS: None  CONDITION: Metta Cordella JUDITHANN Shawl, M.D. [facility] Office: [phone]   07/07/2024, 4:10 PM  "

## 2024-07-08 ENCOUNTER — Encounter: Payer: Self-pay | Admitting: Vascular Surgery

## 2024-07-14 ENCOUNTER — Ambulatory Visit (INDEPENDENT_AMBULATORY_CARE_PROVIDER_SITE_OTHER): Admitting: Family

## 2024-07-14 DIAGNOSIS — M545 Low back pain, unspecified: Secondary | ICD-10-CM | POA: Diagnosis not present

## 2024-07-14 DIAGNOSIS — R8290 Unspecified abnormal findings in urine: Secondary | ICD-10-CM | POA: Diagnosis not present

## 2024-07-14 LAB — POCT URINALYSIS DIPSTICK
Bilirubin, UA: NEGATIVE
Glucose, UA: NEGATIVE
Ketones, UA: NEGATIVE
Nitrite, UA: NEGATIVE
Protein, UA: POSITIVE — AB
Spec Grav, UA: 1.02
Urobilinogen, UA: 1 U/dL
pH, UA: 8.5 — AB

## 2024-07-14 NOTE — Progress Notes (Signed)
" ° °  CHIEF COMPLAINT  UA/ only visit fot UTI     REASON FOR VISIT  Possible UTI, UA Visit Only      ASSESSMENT & PLAN Diagnoses and all orders for this visit:  Low back pain, unspecified back pain laterality, unspecified chronicity, unspecified whether sciatica present -     POCT Urinalysis Dipstick (18997) -     Urinalysis, Routine w reflex microscopic -     Urine Culture  Abnormal urine -     Urinalysis, Routine w reflex microscopic -     Urine Culture     Patient notified.  Total time spent: 5 minutes  ALAN CHRISTELLA ARRANT, FNP 07/14/2024 "

## 2024-07-15 LAB — URINALYSIS, ROUTINE W REFLEX MICROSCOPIC
Bilirubin, UA: NEGATIVE
Glucose, UA: NEGATIVE
Ketones, UA: NEGATIVE
Nitrite, UA: NEGATIVE
Specific Gravity, UA: 1.02 (ref 1.005–1.030)
Urobilinogen, Ur: 1 mg/dL (ref 0.2–1.0)
pH, UA: 8.5 — ABNORMAL HIGH (ref 5.0–7.5)

## 2024-07-15 LAB — MICROSCOPIC EXAMINATION
Casts: NONE SEEN /LPF
RBC, Urine: >30 /HPF — AB (ref 0–2)

## 2024-07-17 ENCOUNTER — Other Ambulatory Visit: Payer: Self-pay | Admitting: Family

## 2024-07-17 ENCOUNTER — Encounter: Payer: Self-pay | Admitting: Family

## 2024-07-17 LAB — URINE CULTURE

## 2024-07-17 MED ORDER — NITROFURANTOIN MONOHYD MACRO 100 MG PO CAPS: 100.0000 mg | ORAL_CAPSULE | Freq: Two times a day (BID) | ORAL | 0 refills | Status: AC

## 2024-07-20 ENCOUNTER — Encounter: Payer: Self-pay | Admitting: Vascular Surgery

## 2024-07-20 ENCOUNTER — Ambulatory Visit: Payer: Self-pay

## 2024-07-22 ENCOUNTER — Ambulatory Visit: Admitting: Family

## 2024-07-22 DIAGNOSIS — J069 Acute upper respiratory infection, unspecified: Secondary | ICD-10-CM

## 2024-07-22 LAB — POCT XPERT XPRESS SARS COVID-2/FLU/RSV
FLU A: NEGATIVE
FLU B: NEGATIVE
RSV RNA, PCR: NEGATIVE
SARS Coronavirus 2: NEGATIVE

## 2024-07-23 ENCOUNTER — Ambulatory Visit: Payer: Self-pay | Admitting: Cardiology

## 2024-07-27 ENCOUNTER — Ambulatory Visit (INDEPENDENT_AMBULATORY_CARE_PROVIDER_SITE_OTHER): Admitting: Vascular Surgery

## 2024-07-31 NOTE — Progress Notes (Signed)
" ° °  Subjective   CHIEF COMPLAINT  COVID Testing    REASON FOR VISIT  COVID testing for URI symptoms.        Objective   Results for orders placed or performed in visit on 07/22/24  POCT XPERT XPRESS SARS COVID-2/FLU/RSV  Result Value Ref Range   SARS Coronavirus 2 Negative    FLU A Negative    FLU B Negative    RSV RNA, PCR Negative     Assessment & Plan  1. Acute upper respiratory infection (Primary)  - POCT XPERT XPRESS SARS COVID-2/FLU/RSV   Total time spent: 5 minutes  ALAN CHRISTELLA ARRANT, FNP 07/22/2024 "

## 2024-08-05 ENCOUNTER — Encounter: Payer: Self-pay | Admitting: Oncology

## 2024-08-07 ENCOUNTER — Other Ambulatory Visit: Payer: Self-pay

## 2024-08-07 MED ORDER — LEVOFLOXACIN 500 MG PO TABS: 500.0000 mg | ORAL_TABLET | Freq: Every day | ORAL | 0 refills | Status: AC
